# Patient Record
Sex: Female | Born: 1940
Health system: Southern US, Community
[De-identification: ages and names within clinical notes are randomized; demographics above are authoritative.]

## PROBLEM LIST (undated history)

## (undated) DIAGNOSIS — R112 Nausea with vomiting, unspecified: Secondary | ICD-10-CM

## (undated) DIAGNOSIS — N189 Chronic kidney disease, unspecified: Secondary | ICD-10-CM

## (undated) DIAGNOSIS — M48061 Spinal stenosis, lumbar region without neurogenic claudication: Secondary | ICD-10-CM

## (undated) DIAGNOSIS — G2581 Restless legs syndrome: Secondary | ICD-10-CM

## (undated) DIAGNOSIS — I10 Essential (primary) hypertension: Secondary | ICD-10-CM

## (undated) DIAGNOSIS — F419 Anxiety disorder, unspecified: Secondary | ICD-10-CM

## (undated) DIAGNOSIS — M797 Fibromyalgia: Secondary | ICD-10-CM

## (undated) DIAGNOSIS — M1990 Unspecified osteoarthritis, unspecified site: Secondary | ICD-10-CM

## (undated) DIAGNOSIS — R011 Cardiac murmur, unspecified: Secondary | ICD-10-CM

## (undated) DIAGNOSIS — I509 Heart failure, unspecified: Secondary | ICD-10-CM

## (undated) DIAGNOSIS — K219 Gastro-esophageal reflux disease without esophagitis: Secondary | ICD-10-CM

## (undated) DIAGNOSIS — G971 Other reaction to spinal and lumbar puncture: Secondary | ICD-10-CM

## (undated) DIAGNOSIS — J189 Pneumonia, unspecified organism: Secondary | ICD-10-CM

## (undated) DIAGNOSIS — C801 Malignant (primary) neoplasm, unspecified: Secondary | ICD-10-CM

## (undated) DIAGNOSIS — Z9889 Other specified postprocedural states: Secondary | ICD-10-CM

## (undated) DIAGNOSIS — H269 Unspecified cataract: Secondary | ICD-10-CM

## (undated) DIAGNOSIS — Z87898 Personal history of other specified conditions: Secondary | ICD-10-CM

## (undated) DIAGNOSIS — E785 Hyperlipidemia, unspecified: Secondary | ICD-10-CM

## (undated) DIAGNOSIS — Z950 Presence of cardiac pacemaker: Secondary | ICD-10-CM

## (undated) DIAGNOSIS — E119 Type 2 diabetes mellitus without complications: Secondary | ICD-10-CM

## (undated) DIAGNOSIS — E041 Nontoxic single thyroid nodule: Secondary | ICD-10-CM

## (undated) DIAGNOSIS — G894 Chronic pain syndrome: Secondary | ICD-10-CM

## (undated) DIAGNOSIS — E1140 Type 2 diabetes mellitus with diabetic neuropathy, unspecified: Secondary | ICD-10-CM

## (undated) HISTORY — PX: TUBAL LIGATION: SHX77

## (undated) HISTORY — PX: COLON SURGERY: SHX602

## (undated) HISTORY — DX: Restless legs syndrome: G25.81

## (undated) HISTORY — PX: TONSILLECTOMY: SUR1361

## (undated) HISTORY — PX: APPENDECTOMY: SHX54

## (undated) HISTORY — PX: PARTIAL NEPHRECTOMY: SHX414

## (undated) HISTORY — DX: Hyperlipidemia, unspecified: E78.5

## (undated) HISTORY — DX: Essential (primary) hypertension: I10

## (undated) HISTORY — PX: BREAST SURGERY: SHX581

## (undated) HISTORY — PX: DILATION AND CURETTAGE OF UTERUS: SHX78

## (undated) HISTORY — DX: Chronic pain syndrome: G89.4

## (undated) HISTORY — DX: Type 2 diabetes mellitus without complications: E11.9

---

## 2015-05-10 ENCOUNTER — Encounter: Payer: Self-pay | Admitting: *Deleted

## 2015-05-10 ENCOUNTER — Ambulatory Visit (INDEPENDENT_AMBULATORY_CARE_PROVIDER_SITE_OTHER): Payer: Medicare Other | Admitting: Cardiovascular Disease

## 2015-05-10 ENCOUNTER — Other Ambulatory Visit: Payer: Self-pay | Admitting: *Deleted

## 2015-05-10 VITALS — BP 169/80 | HR 51 | Ht 64.0 in | Wt 204.0 lb

## 2015-05-10 DIAGNOSIS — R001 Bradycardia, unspecified: Secondary | ICD-10-CM

## 2015-05-10 DIAGNOSIS — R0609 Other forms of dyspnea: Secondary | ICD-10-CM | POA: Diagnosis not present

## 2015-05-10 DIAGNOSIS — R5383 Other fatigue: Secondary | ICD-10-CM | POA: Diagnosis not present

## 2015-05-10 DIAGNOSIS — G4733 Obstructive sleep apnea (adult) (pediatric): Secondary | ICD-10-CM

## 2015-05-10 DIAGNOSIS — I10 Essential (primary) hypertension: Secondary | ICD-10-CM

## 2015-05-10 DIAGNOSIS — I495 Sick sinus syndrome: Secondary | ICD-10-CM | POA: Diagnosis not present

## 2015-05-10 DIAGNOSIS — I38 Endocarditis, valve unspecified: Secondary | ICD-10-CM

## 2015-05-10 DIAGNOSIS — I83893 Varicose veins of bilateral lower extremities with other complications: Secondary | ICD-10-CM

## 2015-05-10 DIAGNOSIS — R0600 Dyspnea, unspecified: Secondary | ICD-10-CM

## 2015-05-10 MED ORDER — VALSARTAN 160 MG PO TABS: 160.0000 mg | ORAL_TABLET | Freq: Every day | ORAL | Status: DC

## 2015-05-10 NOTE — Progress Notes (Signed)
Patient ID: [name], female   DOB: 1940/11/28, 74 y.o.   MRN: [id number]       CARDIOLOGY CONSULT NOTE  Patient ID: [name] MRN: [id number] DOB/AGE: 1941-02-16 74 y.o.  Admit date: (Not on file) Primary Physician Clinton Quant, MD  Reason for Consultation: bradycardia  HPI: The patient is a 74 yr old woman with hypertension, chronic pain, diabetes, obesity, and dyslipidemia referred for bradycardia. She has also been experiencing bilateral leg edema.   She was apparently evaluated by Dr. Orpah Greek (cardiologist in [place]) on 03/29/2015 and he ordered an echocardiogram as well as a vein study and prescribed compression stockings. On the day she was to be evaluated with an echocardiogram, she was apparently not feeling well and her heart rate was in the 30 beat per minute range. She was given a Holter monitor and she had a 2.8 second pause as well as a few PACs and multiple episodes of bradycardia with pauses. Average heart rate was 48 bpm and the heart rate reportedly range from 27-85 bpm.   She has had some issues with balance but denies recent syncope. There have been episodes in the past where she lost consciousness. She does not remember the details surrounding those events. She is not taking any AV nodal blocking agents.  Echocardiogram reportedly showed normal left ventricular systolic function, LVEF 123456, moderate LVH, left atrial dilatation, moderate mitral and tricuspid regurgitation, mild aortic regurgitation, and mild aortic stenosis.   He reportedly discussed having a pacemaker placed. She decided not to proceed with pacemaker placement on 8/16 and comes today for another opinion with her daughter, [name].  She denies chest pain but has had shortness of breath with exertion. Her leg swelling improved with Lasix prn and cessation of amlodipine.  She and her daughter believe she has sleep apnea but she is afraid to get tested due to claustrophobia.  Review  of labs performed on 12/16/14 showed BUN 24, creatinine 1.3, hemoglobin 14.6, total cholesterol 122, triglycerides 94, HDL 54, LDL 49, hemoglobin A1c 7.6%, vitamin D 21.   ECG performed on 03/29/15 show sinus rhythm, heart rate 63 bpm, with a nonspecific T wave abnormality.   She has been checking her blood pressures at home with systolic readings consistently in the 160 and up to 180 range, with heart rates in the 40 beat per minute range.  Believes thyroid function is normal.  Initial ECG in the office today appeared to show sinus vs ectopic bradycardia, heart rate 48 bpm with a nonspecific T wave abnormality. Repeat ECG shows the same.  Soc: Daughter, [name], lives in [place] and attends [employer] with [employer]. Pt lives in [place], [place]. PCP is Dr. Harmon Pier in [place].  Allergies not on file  No current outpatient prescriptions on file.   No current facility-administered medications for this visit.    Past Medical History  Diagnosis Date  . Dyslipidemia   . Diabetes   . Hypertension   . Chronic pain syndrome     No past surgical history on file.  Social History   Social History  . Marital Status: Widowed    Spouse Name: N/A  . Number of Children: N/A  . Years of Education: N/A   Occupational History  . Not on file.   Social History Main Topics  . Smoking status: Former Smoker    Start date: 09/10/1953    Quit date: 09/11/1983  . Smokeless tobacco: Not on file  . Alcohol Use: Not on file  .  Drug Use: Not on file  . Sexual Activity: Not on file   Other Topics Concern  . Not on file   Social History Narrative  . No narrative on file     No family history of premature CAD in 1st degree relatives.  Prior to Admission medications   Not on File     Review of systems complete and found to be negative unless listed above in HPI     Physical exam Height 5\' 4"  (1.626 m), weight 204 lb (92.534 kg). General: NAD Neck: No JVD, no thyromegaly or thyroid  nodule.  Lungs: Clear to auscultation bilaterally with normal respiratory effort. CV: Nondisplaced PMI. Bradycardic, regular rhythm, normal S1/S2, no [id number], soft systolic murmur along left sternal border.  1+ pitting pretibial and periankle edema.  No carotid bruit.  Normal pedal pulses.  Abdomen: Soft, nontender, obese, no distention.  Skin: Intact without lesions or rashes.  Neurologic: Alert and oriented x 3.  Psych: Normal affect. Extremities: No clubbing or cyanosis.  HEENT: Normal.   ECG: Most recent ECG reviewed.  Labs:  No results found for: WBC, HGB, HCT, MCV, PLT No results for input(s): NA, K, CL, CO2, BUN, CREATININE, CALCIUM, PROT, BILITOT, ALKPHOS, ALT, AST, GLUCOSE in the last 168 hours.  Invalid input(s): LABALBU No results found for: CKTOTAL, CKMB, CKMBINDEX, TROPONINI No results found for: CHOL No results found for: HDL No results found for: LDLCALC No results found for: TRIG No results found for: CHOLHDL No results found for: LDLDIRECT       Studies: No results found.  ASSESSMENT AND PLAN:  1. Bradycardia and pauses with exertional dyspnea and dizziness with fatigue: She has sick sinus syndrome. I will have her wear a two-week event monitor to evaluate for additional pauses while awake as well as for symptomatic bradycardia. If she indeed has symptomatic bradycardia along with pauses while awake, pacemaker implantation would indeed be indicated. She likely has sleep apnea which is also contributing but she is afraid to pursue a sleep study. I will obtain most recent blood tests from PCP including TSH.  2. Essential HTN: Markedly elevated, and likely being exacerbated by uncontrolled sleep apnea. Amlodipine has been on hold as it may have been contributing to leg swelling. Will stop benazepril (unlikely to achieve further BP reduction with increased doses) and start Diovan 160 mg daily. Will check a basic metabolic panel in 2 weeks.  3. Sleep apnea: Afraid to  pursue sleep study due to claustrophobia. Likely contributing to uncontrolled hypertension as well as bradyarrhythmias.  4. Valvular heart disease: Monitor clinically for heart failure. Stable at present.  5. Leg edema: Using Lasix. Prescribed compression stockings in past. Likely multifactorial and due to venous insufficiency, uncontrolled hypertension, and sick sinus syndrome.  Dispo: f/u 1 month.   Signed: Kate Sable, M.D., F.A.C.C.  05/10/2015, 9:41 AM

## 2015-05-10 NOTE — Patient Instructions (Addendum)
Your physician has recommended you make the following change in your medication:  Stop benazepril. Start diovan 160 mg daily. Continue all other medications the same. Your physician recommends that you return for lab work today to check your BMET. Your physician has recommended that you wear an event monitor for 14 days. Event monitors are medical devices that record the heart's electrical activity. Doctors most often [place] these monitors to diagnose arrhythmias. Arrhythmias are problems with the speed or rhythm of the heartbeat. The monitor is a small, portable device. You can wear one while you do your normal daily activities. This is usually used to diagnose what is causing palpitations/syncope (passing out). Your physician has requested that you regularly monitor and record your blood pressure readings at home. Please use the same machine at the same time of day to check your readings and record them to bring to your follow-up visit.[employer] will contact you directly about this monitor. Your physician recommends that you schedule a follow-up appointment in: 4-6 weeks.

## 2015-05-12 ENCOUNTER — Telehealth: Payer: Self-pay | Admitting: *Deleted

## 2015-05-12 NOTE — Telephone Encounter (Signed)
Notes Recorded by Laurine Blazer, LPN on QA348G at 624THL AM Copy fwd to PMD.  Notes Recorded by Laurine Blazer, LPN on QA348G at 624THL AM Patient notified and verbalized understanding.

## 2015-05-12 NOTE — Telephone Encounter (Signed)
-----   Message from Herminio Commons, MD sent at 05/10/2015  4:34 PM EDT ----- Kidney function improved when compared to labs from April.

## 2015-05-17 ENCOUNTER — Telehealth: Payer: Self-pay | Admitting: Cardiovascular Disease

## 2015-05-17 NOTE — Telephone Encounter (Signed)
Mrs. Bullman called stating that she wants to know when she will be hearing about her heart monitor.

## 2015-05-18 NOTE — Telephone Encounter (Signed)
Orders were placed on 8/30 but did not get enrolled with [employer] as pt says she has never received monitor. Enrolled pt for 14 day event monitor and LM for pt.

## 2015-05-23 ENCOUNTER — Ambulatory Visit (INDEPENDENT_AMBULATORY_CARE_PROVIDER_SITE_OTHER): Payer: Medicare Other

## 2015-05-23 DIAGNOSIS — R001 Bradycardia, unspecified: Secondary | ICD-10-CM

## 2015-05-27 ENCOUNTER — Telehealth: Payer: Self-pay | Admitting: *Deleted

## 2015-05-27 NOTE — Telephone Encounter (Signed)
Telemetry strips reviewed. Show sinus bradycardia to 30s, as well as intermittent junctional escape beats. Patient reportedly asymptomatic. Episodes occurred in early AM at 316 and 440AM, however also epsidoe around 822 pm. She is on no av nodal agents. Continue monitor and f/u with Dr Bronson Ing.   Zandra Abts MD

## 2015-05-27 NOTE — Telephone Encounter (Signed)
Can we print out the strips of the event  Zandra Abts MD

## 2015-05-27 NOTE — Telephone Encounter (Signed)
[name] from [facility] called per protocol to report sinus bradycardia of 28 BPM this AM for pt wearing event monitor. Pt was contacted and was asymptomatic sweeping the kitchen floor at the time. HR is 60 BPM at present. Will forward to Dr. Harl Bowie with Dr. Bronson Ing out of office this week.

## 2015-05-30 ENCOUNTER — Telehealth: Payer: Self-pay | Admitting: Cardiovascular Disease

## 2015-05-30 NOTE — Telephone Encounter (Signed)
Stated that she wore monitor x 10 days.  Was supposed to wear for 14 days, but could not tolerate any longer.  She will go ahead & mail back to [employer].

## 2015-05-30 NOTE — Telephone Encounter (Signed)
Mrs. Mandella called stating that she took her monitor off yesterday. States that it is causing her to have a rash.

## 2015-06-03 ENCOUNTER — Telehealth: Payer: Self-pay | Admitting: Cardiovascular Disease

## 2015-06-03 NOTE — Telephone Encounter (Signed)
Wants to know what monitor showed.  She needs to have a tooth pullled and would like to know what she needs to do about having dentist contact us or does she have to see Dr Raliegh Ip first

## 2015-06-03 NOTE — Telephone Encounter (Signed)
Patient advised that once her monitor has been resulted on, we would contact her with results. Also advised patient to have her dentist office contact us directly about any questions or concerns. Patient verbalized understanding.

## 2015-06-06 NOTE — Telephone Encounter (Signed)
patient called asking for results of her monitor. Said that she has some errands to run this morning but would be home later this afternoon. Would like a call back today

## 2015-06-08 ENCOUNTER — Telehealth: Payer: Self-pay | Admitting: Cardiovascular Disease

## 2015-06-08 DIAGNOSIS — I441 Atrioventricular block, second degree: Secondary | ICD-10-CM

## 2015-06-08 NOTE — Telephone Encounter (Signed)
I called and spoke with [name] and informed her of the event monitoring results, and the need for pacemaker placement. Will arrange for her to see Dr. Jolyn Nap.

## 2015-06-08 NOTE — Telephone Encounter (Signed)
Referral done & [facility]) made aware.

## 2015-06-08 NOTE — Addendum Note (Signed)
Addended by: Laurine Blazer on: 06/08/2015 05:00 PM   Modules accepted: Orders

## 2015-06-10 ENCOUNTER — Other Ambulatory Visit: Payer: Self-pay

## 2015-06-13 ENCOUNTER — Encounter: Payer: Self-pay | Admitting: Internal Medicine

## 2015-06-13 ENCOUNTER — Ambulatory Visit (INDEPENDENT_AMBULATORY_CARE_PROVIDER_SITE_OTHER): Payer: Medicare Other | Admitting: Internal Medicine

## 2015-06-13 VITALS — BP 154/88 | HR 49 | Ht 64.0 in | Wt 201.8 lb

## 2015-06-13 DIAGNOSIS — R0609 Other forms of dyspnea: Secondary | ICD-10-CM

## 2015-06-13 DIAGNOSIS — R0600 Dyspnea, unspecified: Secondary | ICD-10-CM

## 2015-06-13 DIAGNOSIS — I441 Atrioventricular block, second degree: Secondary | ICD-10-CM

## 2015-06-13 LAB — BASIC METABOLIC PANEL
BUN: 13 mg/dL (ref 6–23)
CO2: 27 mEq/L (ref 19–32)
Calcium: 9.5 mg/dL (ref 8.4–10.5)
Chloride: 103 mEq/L (ref 96–112)
Creatinine, Ser: 1.24 mg/dL — ABNORMAL HIGH (ref 0.40–1.20)
GFR: 44.67 mL/min — ABNORMAL LOW (ref >60.00)
Glucose, Bld: 115 mg/dL — ABNORMAL HIGH (ref 70–99)
Potassium: 5.1 mEq/L (ref 3.5–5.1)
Sodium: 139 mEq/L (ref 135–145)

## 2015-06-13 LAB — TSH: TSH: 3.44 u[IU]/mL (ref 0.35–4.50)

## 2015-06-13 NOTE — Patient Instructions (Signed)
Medication Instructions:  Your physician recommends that you continue on your current medications as directed. Please refer to the Current Medication list given to you today.  Labwork: Today: BMET & TSH  Testing/Procedures: Your physician has requested that you have a lexiscan myoview. For further information please visit [email or web address]. Please follow instruction sheet, as given.  Follow-Up: Follow up to be determined after Myoview Carlton Adam) testing.  Any Other Special Instructions Will Be Listed Below (If Applicable). Thank you for choosing [facility]!!

## 2015-06-13 NOTE — Progress Notes (Signed)
ELECTROPHYSIOLOGY CONSULT NOTE  Patient ID: [name], MRN: [id number], DOB/AGE: 1941-02-13 73 y.o. Admit date: (Not on file) Date of Consult: 06/13/2015  Primary Physician: Clinton Quant, MD Primary Cardiologist: SKo  Chief Complaint: bradycardia   HPI [name] is a 74 y.o. female  Seen following recommendation. Goal for pacemaker implantation because of bradycardia.  She underwent evaluation for dyspnea on exertion and was noted to have bradycardia. This is been present I guess for some time. She was referred to cardiology.   Her evaluation has included a Holter monitor which had a nocturnal sinus pause of 2.8 seconds. An outpatient event recorder was described as having 2:1 AV block; I think that this is erroneous. I I think that she has sinus bradycardia with prominent U waves.. It is felt that she has sleep apnea but has been reluctant to be tested because of claustrophobia  She has significant dyspnea on exertion but denies exertional chest discomfort. She sleeps in a recliner and is unable to lie flat. She has had ongoing problems with peripheral edema as outlined below.     Echocardiogram demonstrated mild aortic stenosis and normal LV function  Her past medical history is noted for hypertension and diabetes obesity and hyperlipidemia she's also had problems with  peripheral edema the latter treated with when necessary Lasix and the discontinuation of amlodipine.  Apparently venous Dopplers prompted the prescription of compression stockings suggesting venous insufficiency.  She has a history of recurrent orthostatic syncope. She has fractured her feet on multiple occasions. She's had no syncope sitting down.  She has poor balance.   Creatinine 4/16 was 1.3  She was started on ARB for blood pressure. Renal function has not been reassessed. No TSH is available in the medical record.  Past Medical History  Diagnosis Date  . Dyslipidemia   . Diabetes ([place])     . Hypertension   . Chronic pain syndrome   . Restless legs       Surgical History:  Past Surgical History  Procedure Laterality Date  . Partial nephrectomy       Home Meds: Prior to Admission medications   Medication Sig Start Date End Date Taking? Authorizing Provider  amLODipine (NORVASC) 5 MG tablet Take 1 tablet by mouth daily 03/21/15   Historical Provider, MD  benazepril (LOTENSIN) 20 MG tablet Take 1 tablet by mouth daily 04/11/15   Historical Provider, MD  Cholecalciferol (VITAMIN D) 2000 UNITS tablet Take 2,000 Units by mouth daily.    Historical Provider, MD  DULoxetine (CYMBALTA) 20 MG capsule Take 20 mg by mouth daily.    Historical Provider, MD  furosemide (LASIX) 20 MG tablet Take 20-40 mg by mouth daily as needed (swelling).     Historical Provider, MD  gabapentin (NEURONTIN) 800 MG tablet Take 800 mg by mouth 3 (three) times daily.    Historical Provider, MD  LORazepam (ATIVAN) 1 MG tablet Take 1 mg by mouth every 8 (eight) hours. TAKE 1/2 TO 1 TABLET DAILY AS NEEDED    Historical Provider, MD  Melatonin 10 MG CAPS Take by mouth.    Historical Provider, MD  meloxicam (MOBIC) 7.5 MG tablet Take 7.5 mg by mouth daily.    Historical Provider, MD  OxyCODONE (OXYCONTIN) 10 mg T12A 12 hr tablet Take 10 mg by mouth every 12 (twelve) hours.    Historical Provider, MD  QUININE SULFATE PO Take 260 mg by mouth.    Historical Provider, MD  simvastatin (ZOCOR) 40 MG tablet  Take 40 mg by mouth daily.    Historical Provider, MD  sitaGLIPtin (JANUVIA) 100 MG tablet Take 50 mg by mouth daily.    Historical Provider, MD  tiZANidine (ZANAFLEX) 2 MG tablet TK 1 T PO  BID TO TID 03/10/15   Historical Provider, MD  valsartan (DIOVAN) 160 MG tablet Take 1 tablet (160 mg total) by mouth daily. 05/10/15   Herminio Commons, MD  zolpidem (AMBIEN) 10 MG tablet Take 10 mg by mouth at bedtime as needed for sleep.    Historical Provider, MD     Allergies:  Allergies  Allergen Reactions  . Aspirin      Stomach burning  . Ivp Dye [Iodinated Diagnostic Agents]     Can use if Benadryl and Prednisone are used first    . Penicillins     Severe diarrhea   . Sulfa Antibiotics Diarrhea  . Iodine Itching, Swelling and Rash    Can use if Benadryl and Prednisone are used first     Social History   Social History  . Marital Status: Widowed    Spouse Name: N/A  . Number of Children: N/A  . Years of Education: N/A   Occupational History  . Not on file.   Social History Main Topics  . Smoking status: Former Smoker    Start date: 09/10/1953    Quit date: 09/11/1983  . Smokeless tobacco: Not on file  . Alcohol Use: Not on file  . Drug Use: Not on file  . Sexual Activity: Not on file   Other Topics Concern  . Not on file   Social History Narrative     Family History  Problem Relation Age of Onset  . CAD    . Sick sinus syndrome Brother     has a PPM     ROS:  Please see the history of present illness.     All other systems reviewed and negative.    Physical Exam:  Blood pressure 154/88, pulse 49, height 5\' 4"  (1.626 m), weight 201 lb 12.8 oz (91.536 kg). General: Well developed, well nourished female in no acute distress. Head: Normocephalic, atraumatic, sclera non-icteric, no xanthomas, nares are without discharge. EENT: normal Lymph Nodes:  none Back: without scoliosis/kyphosis , no CVA tendersness Neck: Negative for carotid bruits. JVD 8-10 cm Lungs: Clear bilaterally to auscultation without wheezes, rales, or rhonchi. Breathing is unlabored. Heart: RRR with S1 S2.  2/6 systolic murmur , rubs, or gallops appreciated. Abdomen: Soft, non-tender, non-distended with normoactive bowel sounds. No hepatomegaly. No rebound/guarding. No obvious abdominal masses. Msk:  Strength and tone appear normal for age. There is significant foot deformities Extremities: No clubbing or cyanosis.  1+edema.  Distal pedal pulses are 2+ and equal bilaterally. Skin: Warm and Dry Neuro:  Alert and oriented X 3. CN III-XII intact Grossly normal sensory and motor function . Psych:  Responds to questions appropriately with a normal affect.      Labs: Cardiac Enzymes No results for input(s): CKTOTAL, CKMB, TROPONINI in the last 72 hours. CBC No results found for: WBC, HGB, HCT, MCV, PLT PROTIME: No results for input(s): LABPROT, INR in the last 72 hours. Chemistry No results for input(s): NA, K, CL, CO2, BUN, CREATININE, CALCIUM, PROT, BILITOT, ALKPHOS, ALT, AST, GLUCOSE in the last 168 hours.  Invalid input(s): LABALBU Lipids No results found for: CHOL, HDL, LDLCALC, TRIG BNP No results found for: PROBNP Thyroid Function Tests: No results for input(s): TSH, T4TOTAL, T3FREE, THYROIDAB in the last  72 hours.  Invalid input(s): FREET3    Miscellaneous No results found for: DDIMER  Radiology/Studies:  No results found.  EKG:  Junctional rhythm with a single isolated P-wave    Assessment and Plan:   Sinus node dysfunction-symptomatic  Obstructive sleep apnea  Dyspnea on exertion  Hypertension   Diabetes with neuropathy  There are multiple issues. She has sinus arrest evidenced on her tracings today with a isolated P-wave. We discussed the physiology of bradycardia as suggested by her Holter monitor with a peak heart rate of 80 as well as a contribution of atrial filling 2 cardiac performance and its importance and hypertensive/diabetic cardiomyopathy. I think she has some degree of HFpEF. Patient is appropriate. She has symptomatic sinus bradycardia without secondary causes.  Her dyspnea on exertion has a multitude of potential explanations including the above. However, given her long-standing diabetes and hypertension, I think excluding coronary disease is appropriate. We will undertake a Lexiscan Myoview.  She also has obstructive sleep apnea with sleep disordered breathing and daytime somnolence and nocturnal profound bradycardia. She has a history of  claustrophobia. She is reluctant to pursue sleep testing. I've encouraged her to do so.  She has a history of renal issues with single kidney-apparently. We will recheck her metabolic profile. Given the fact that she has some degree of volume overload, we will initiate diuretic therapy once we have an idea a to what  her kidney function is.      Virl Axe

## 2015-06-16 ENCOUNTER — Telehealth (HOSPITAL_COMMUNITY): Payer: Self-pay | Admitting: *Deleted

## 2015-06-16 NOTE — Telephone Encounter (Signed)
Patient given detailed instructions per Myocardial Perfusion Study Information Sheet for test on 10/11/16at 1000. Patient notified to arrive 15 minutes early and that it is imperative to arrive on time for appointment to keep from having the test rescheduled.  If you need to cancel or reschedule your appointment, please call the office within 24 hours of your appointment. Failure to do so may result in a cancellation of your appointment, and a $50 no show fee. Patient verbalized understanding. Hubbard Robinson, RN

## 2015-06-21 ENCOUNTER — Ambulatory Visit (HOSPITAL_COMMUNITY): Payer: Medicare Other | Attending: Cardiovascular Disease

## 2015-06-21 DIAGNOSIS — R002 Palpitations: Secondary | ICD-10-CM | POA: Diagnosis not present

## 2015-06-21 DIAGNOSIS — R5383 Other fatigue: Secondary | ICD-10-CM | POA: Insufficient documentation

## 2015-06-21 DIAGNOSIS — R0609 Other forms of dyspnea: Secondary | ICD-10-CM | POA: Diagnosis not present

## 2015-06-21 DIAGNOSIS — R0600 Dyspnea, unspecified: Secondary | ICD-10-CM

## 2015-06-21 DIAGNOSIS — E119 Type 2 diabetes mellitus without complications: Secondary | ICD-10-CM | POA: Insufficient documentation

## 2015-06-21 DIAGNOSIS — I10 Essential (primary) hypertension: Secondary | ICD-10-CM | POA: Diagnosis not present

## 2015-06-21 LAB — MYOCARDIAL PERFUSION IMAGING
LV dias vol: 101 mL
LV sys vol: 36 mL
Peak HR: 61 {beats}/min
RATE: 0.27
Rest HR: 38 {beats}/min
SDS: 3
SRS: 4
SSS: 6
TID: 1.21

## 2015-06-21 MED ORDER — REGADENOSON 0.4 MG/5ML IV SOLN
0.4000 mg | Freq: Once | INTRAVENOUS | Status: AC
  Administered 2015-06-21: 0.4 mg via INTRAVENOUS

## 2015-06-21 MED ORDER — TECHNETIUM TC 99M SESTAMIBI GENERIC - CARDIOLITE
32.8000 | Freq: Once | INTRAVENOUS | Status: AC | PRN
  Administered 2015-06-21: 32.8 via INTRAVENOUS

## 2015-06-21 MED ORDER — TECHNETIUM TC 99M SESTAMIBI GENERIC - CARDIOLITE
10.9000 | Freq: Once | INTRAVENOUS | Status: AC | PRN
  Administered 2015-06-21: 10.9 via INTRAVENOUS

## 2015-06-23 ENCOUNTER — Telehealth: Payer: Self-pay | Admitting: Internal Medicine

## 2015-06-23 DIAGNOSIS — I495 Sick sinus syndrome: Secondary | ICD-10-CM

## 2015-06-23 DIAGNOSIS — Z01812 Encounter for preprocedural laboratory examination: Secondary | ICD-10-CM

## 2015-06-23 NOTE — Telephone Encounter (Signed)
New Message   Pt called and wants results of her MYOCARDIAL PERFUSION   Please call patient

## 2015-06-23 NOTE — Telephone Encounter (Signed)
I spoke with the patient and made her aware of her myoview results. She is wanting to know the follow up plan for her going forward as there was mention of a PPM at one point. I advised her I will review with Dr. Caryl Comes and call her back. She is agreeable.

## 2015-06-24 ENCOUNTER — Ambulatory Visit: Payer: Medicare Other | Admitting: Cardiovascular Disease

## 2015-06-24 NOTE — Telephone Encounter (Signed)
The patient is scheduled for her PPM implant on 11/10 at her request. I advised her I will mail her her letter of instructions and will contact her PCP office next week to see if her pre-procedure labs can be done there since she lives in [place]. I will call her back to confirm this. She is agreeable.

## 2015-06-24 NOTE — Telephone Encounter (Signed)
Reviewed follow up plan for the patient since myoview is normal. Per Dr. Caryl Comes- ok to set up for PPM implant. The patient is aware of this and dates given.  She will call back to schedule once she reviews with her daughter.

## 2015-07-01 NOTE — Telephone Encounter (Signed)
Attempted to call Dr. Danelle Berry office regarding patient's pre-procedure labs being drawn there for PPM implant on 07/21/15. Per message at the office [phone], the office is closed on [place]. The phone are open M-F 7:30 am- 4:30 pm. Will call back next week.

## 2015-07-04 ENCOUNTER — Encounter: Payer: Self-pay | Admitting: *Deleted

## 2015-07-04 NOTE — Telephone Encounter (Signed)
I spoke with the patient. She is aware that she may go to her PCP office for labs. Lab order/ PPM implant instruction sheet mailed to the patient. I also advised her that Dr. Caryl Comes was in agreement with her stopping amlodipine as previously recommended by Dr. Bronson Ing.

## 2015-07-04 NOTE — Telephone Encounter (Signed)
Spoke with Dr. Danelle Berry office regarding patient going there for pre-procedure labs. Per staff, ok for patient to come with lab order in hand. No appointment needed. I left a message for the patient to call.

## 2015-07-06 ENCOUNTER — Telehealth: Payer: Self-pay | Admitting: Internal Medicine

## 2015-07-06 NOTE — Telephone Encounter (Signed)
New message      Returning Heather's call

## 2015-07-06 NOTE — Telephone Encounter (Signed)
I spoke with the patient. Advised I hadn't tried to call her. She did report some swelling after eating [place] food and an elevated BP yesterday. I advised her if she is still having swelling, to take a lasix 20 mg tablet x 2 days, and to monitor her BP no more than twice daily and at least 30 minutes- 1 hour after her meds are taken.

## 2015-07-14 ENCOUNTER — Telehealth: Payer: Self-pay | Admitting: Internal Medicine

## 2015-07-14 ENCOUNTER — Ambulatory Visit: Payer: Medicare Other | Admitting: Internal Medicine

## 2015-07-14 NOTE — Telephone Encounter (Signed)
New problem    Pt need call back to discuss some questions concerning her pace maker placement.

## 2015-07-14 NOTE — Telephone Encounter (Signed)
I spoke with the patient and answered her questions regarding her PPM implant. She also reports today that her BP has been somewhat elevated.  She went for her pre-procedure labs at her PCP this morning and her BP was checked there- she was 205/88. She was given samples of Azor 5/40 mg to take once daily in place of her Diovan. She will start samples and call Dr. Danelle Berry office back on Monday to follow up.

## 2015-07-21 ENCOUNTER — Encounter (HOSPITAL_COMMUNITY): Payer: Self-pay | Admitting: *Deleted

## 2015-07-21 ENCOUNTER — Ambulatory Visit (HOSPITAL_COMMUNITY)
Admission: RE | Admit: 2015-07-21 | Discharge: 2015-07-22 | Disposition: A | Discharge status: Home / Self Care | Payer: Medicare Other | Source: Ambulatory Visit | Attending: Internal Medicine | Admitting: Internal Medicine

## 2015-07-21 ENCOUNTER — Encounter (HOSPITAL_COMMUNITY)
Admission: RE | Disposition: A | Discharge status: Home / Self Care | Payer: Medicare Other | Source: Ambulatory Visit | Attending: Internal Medicine

## 2015-07-21 DIAGNOSIS — R0600 Dyspnea, unspecified: Secondary | ICD-10-CM | POA: Insufficient documentation

## 2015-07-21 DIAGNOSIS — Z959 Presence of cardiac and vascular implant and graft, unspecified: Secondary | ICD-10-CM | POA: Insufficient documentation

## 2015-07-21 DIAGNOSIS — E785 Hyperlipidemia, unspecified: Secondary | ICD-10-CM | POA: Diagnosis not present

## 2015-07-21 DIAGNOSIS — I10 Essential (primary) hypertension: Secondary | ICD-10-CM | POA: Diagnosis not present

## 2015-07-21 DIAGNOSIS — E1140 Type 2 diabetes mellitus with diabetic neuropathy, unspecified: Secondary | ICD-10-CM | POA: Insufficient documentation

## 2015-07-21 DIAGNOSIS — Z91041 Radiographic dye allergy status: Secondary | ICD-10-CM | POA: Insufficient documentation

## 2015-07-21 DIAGNOSIS — Z880 Allergy status to penicillin: Secondary | ICD-10-CM | POA: Diagnosis not present

## 2015-07-21 DIAGNOSIS — G894 Chronic pain syndrome: Secondary | ICD-10-CM | POA: Diagnosis not present

## 2015-07-21 DIAGNOSIS — I495 Sick sinus syndrome: Secondary | ICD-10-CM | POA: Diagnosis not present

## 2015-07-21 DIAGNOSIS — G4733 Obstructive sleep apnea (adult) (pediatric): Secondary | ICD-10-CM | POA: Diagnosis not present

## 2015-07-21 HISTORY — PX: EP IMPLANTABLE DEVICE: SHX172B

## 2015-07-21 LAB — BASIC METABOLIC PANEL
Anion gap: 7 (ref 5–15)
BUN: 20 mg/dL (ref 6–20)
CO2: 31 mmol/L (ref 22–32)
Calcium: 9.5 mg/dL (ref 8.9–10.3)
Chloride: 101 mmol/L (ref 101–111)
Creatinine, Ser: 1.27 mg/dL — ABNORMAL HIGH (ref 0.44–1.00)
GFR calc Af Amer: 46 mL/min — ABNORMAL LOW (ref >60)
GFR calc non Af Amer: 40 mL/min — ABNORMAL LOW (ref >60)
Glucose, Bld: 118 mg/dL — ABNORMAL HIGH (ref 65–99)
Potassium: 4.8 mmol/L (ref 3.5–5.1)
Sodium: 139 mmol/L (ref 135–145)

## 2015-07-21 LAB — CBC
HCT: 44.7 % (ref 36.0–46.0)
Hemoglobin: 14.9 g/dL (ref 12.0–15.0)
MCH: 31.3 pg (ref 26.0–34.0)
MCHC: 33.3 g/dL (ref 30.0–36.0)
MCV: 93.9 fL (ref 78.0–100.0)
Platelets: 122 10*3/uL — ABNORMAL LOW (ref 150–400)
RBC: 4.76 MIL/uL (ref 3.87–5.11)
RDW: 13.6 % (ref 11.5–15.5)
WBC: 8.4 10*3/uL (ref 4.0–10.5)

## 2015-07-21 LAB — GLUCOSE, CAPILLARY
Glucose-Capillary: 113 mg/dL — ABNORMAL HIGH (ref 65–99)
Glucose-Capillary: 356 mg/dL — ABNORMAL HIGH (ref 65–99)

## 2015-07-21 LAB — SURGICAL PCR SCREEN
MRSA, PCR: NEGATIVE
Staphylococcus aureus: NEGATIVE

## 2015-07-21 LAB — PROTIME-INR
INR: 1.02 (ref 0.00–1.49)
Prothrombin Time: 13.6 seconds (ref 11.6–15.2)

## 2015-07-21 SURGERY — PACEMAKER IMPLANT
Anesthesia: LOCAL

## 2015-07-21 MED ORDER — AMLODIPINE BESYLATE 5 MG PO TABS
5.0000 mg | ORAL_TABLET | Freq: Every day | ORAL | Status: DC
Start: 2015-07-22 — End: 2015-07-22
  Administered 2015-07-22: 5 mg via ORAL
  Filled 2015-07-21: qty 1

## 2015-07-21 MED ORDER — ZOLPIDEM TARTRATE 10 MG PO TABS: 5.0000 mg | ORAL_TABLET | Freq: Every day | ORAL | Status: DC

## 2015-07-21 MED ORDER — VITAMIN D 1000 UNITS PO TABS
2000.0000 [IU] | ORAL_TABLET | Freq: Every day | ORAL | Status: DC
  Administered 2015-07-22: 2000 [IU] via ORAL
  Filled 2015-07-21: qty 2

## 2015-07-21 MED ORDER — AMLODIPINE BESYLATE 2.5 MG PO TABS
2.5000 mg | ORAL_TABLET | Freq: Every day | ORAL | Status: DC | PRN
  Filled 2015-07-21: qty 1

## 2015-07-21 MED ORDER — ZOLPIDEM TARTRATE 5 MG PO TABS
5.0000 mg | ORAL_TABLET | Freq: Every evening | ORAL | Status: DC | PRN
  Administered 2015-07-21: 21:00:00 5 mg via ORAL
  Filled 2015-07-21: qty 1

## 2015-07-21 MED ORDER — IRBESARTAN 300 MG PO TABS
300.0000 mg | ORAL_TABLET | Freq: Every day | ORAL | Status: DC
  Administered 2015-07-22: 300 mg via ORAL
  Filled 2015-07-21: qty 1

## 2015-07-21 MED ORDER — HEPARIN (PORCINE) IN NACL 2-0.9 UNIT/ML-% IJ SOLN
INTRAMUSCULAR | Status: AC
  Filled 2015-07-21: qty 500

## 2015-07-21 MED ORDER — MUPIROCIN 2 % EX OINT
TOPICAL_OINTMENT | CUTANEOUS | Status: AC
  Administered 2015-07-21: 1
  Filled 2015-07-21: qty 22

## 2015-07-21 MED ORDER — MIDAZOLAM HCL 5 MG/5ML IJ SOLN
INTRAMUSCULAR | Status: DC | PRN
  Administered 2015-07-21: 2 mg via INTRAVENOUS
  Administered 2015-07-21 (×2): 1 mg via INTRAVENOUS

## 2015-07-21 MED ORDER — QUININE SULFATE 324 MG PO CAPS: 20.0000 mg | ORAL_CAPSULE | ORAL | Status: DC | PRN

## 2015-07-21 MED ORDER — DULOXETINE HCL 20 MG PO CPEP
20.0000 mg | ORAL_CAPSULE | Freq: Every day | ORAL | Status: DC
  Administered 2015-07-22: 09:00:00 20 mg via ORAL
  Filled 2015-07-21: qty 1

## 2015-07-21 MED ORDER — VANCOMYCIN HCL IN DEXTROSE 1-5 GM/200ML-% IV SOLN: 1000.0000 mg | INTRAVENOUS | Status: DC

## 2015-07-21 MED ORDER — OXYCODONE HCL ER 10 MG PO T12A
10.0000 mg | EXTENDED_RELEASE_TABLET | Freq: Two times a day (BID) | ORAL | Status: DC
  Administered 2015-07-21 – 2015-07-22 (×2): 10 mg via ORAL
  Filled 2015-07-21 (×2): qty 1

## 2015-07-21 MED ORDER — METHYLPREDNISOLONE SODIUM SUCC 125 MG IJ SOLR
INTRAMUSCULAR | Status: AC
  Filled 2015-07-21: qty 2

## 2015-07-21 MED ORDER — ACETAMINOPHEN 325 MG PO TABS
325.0000 mg | ORAL_TABLET | ORAL | Status: DC | PRN
  Administered 2015-07-21 – 2015-07-22 (×2): 650 mg via ORAL
  Filled 2015-07-21 (×2): qty 2

## 2015-07-21 MED ORDER — AMLODIPINE-OLMESARTAN 5-40 MG PO TABS: 1.0000 | ORAL_TABLET | Freq: Every day | ORAL | Status: DC

## 2015-07-21 MED ORDER — MIDAZOLAM HCL 5 MG/5ML IJ SOLN
INTRAMUSCULAR | Status: AC
  Filled 2015-07-21: qty 25

## 2015-07-21 MED ORDER — LIDOCAINE HCL (PF) 1 % IJ SOLN
INTRAMUSCULAR | Status: DC | PRN
  Administered 2015-07-21: 17:00:00

## 2015-07-21 MED ORDER — INSULIN ASPART 100 UNIT/ML ~~LOC~~ SOLN
4.0000 [IU] | Freq: Once | SUBCUTANEOUS | Status: AC
  Administered 2015-07-21: 23:00:00 4 [IU] via SUBCUTANEOUS

## 2015-07-21 MED ORDER — SODIUM CHLORIDE 0.9 % IV SOLN: INTRAVENOUS | Status: DC

## 2015-07-21 MED ORDER — ACETAMINOPHEN 500 MG PO TABS: 1000.0000 mg | ORAL_TABLET | Freq: Four times a day (QID) | ORAL | Status: DC | PRN

## 2015-07-21 MED ORDER — DIPHENHYDRAMINE HCL 50 MG/ML IJ SOLN
25.0000 mg | INTRAMUSCULAR | Status: AC
  Administered 2015-07-21: 25 mg via INTRAVENOUS

## 2015-07-21 MED ORDER — FUROSEMIDE 20 MG PO TABS
20.0000 mg | ORAL_TABLET | Freq: Every day | ORAL | Status: DC | PRN
Start: 2015-07-21 — End: 2015-07-22

## 2015-07-21 MED ORDER — FENTANYL CITRATE (PF) 100 MCG/2ML IJ SOLN
INTRAMUSCULAR | Status: DC | PRN
  Administered 2015-07-21 (×3): 25 ug via INTRAVENOUS

## 2015-07-21 MED ORDER — ONDANSETRON HCL 4 MG/2ML IJ SOLN: 4.0000 mg | Freq: Four times a day (QID) | INTRAMUSCULAR | Status: DC | PRN

## 2015-07-21 MED ORDER — DIPHENHYDRAMINE HCL 50 MG/ML IJ SOLN
INTRAMUSCULAR | Status: AC
  Filled 2015-07-21: qty 1

## 2015-07-21 MED ORDER — YOU HAVE A PACEMAKER BOOK
Freq: Once | Status: AC
  Administered 2015-07-21: 19:00:00
  Filled 2015-07-21: qty 1

## 2015-07-21 MED ORDER — METHYLPREDNISOLONE SODIUM SUCC 125 MG IJ SOLR
125.0000 mg | INTRAMUSCULAR | Status: AC
  Administered 2015-07-21: 125 mg via INTRAVENOUS

## 2015-07-21 MED ORDER — CEFAZOLIN SODIUM-DEXTROSE 2-3 GM-% IV SOLR
INTRAVENOUS | Status: DC | PRN
  Administered 2015-07-21: 2 g via INTRAVENOUS

## 2015-07-21 MED ORDER — FAMOTIDINE IN NACL 20-0.9 MG/50ML-% IV SOLN
INTRAVENOUS | Status: AC
  Filled 2015-07-21: qty 50

## 2015-07-21 MED ORDER — LORAZEPAM 0.5 MG PO TABS: 0.5000 mg | ORAL_TABLET | Freq: Three times a day (TID) | ORAL | Status: DC | PRN

## 2015-07-21 MED ORDER — SIMVASTATIN 20 MG PO TABS: 40.0000 mg | ORAL_TABLET | Freq: Every day | ORAL | Status: DC

## 2015-07-21 MED ORDER — LINAGLIPTIN 5 MG PO TABS
5.0000 mg | ORAL_TABLET | Freq: Every day | ORAL | Status: DC
  Administered 2015-07-22: 5 mg via ORAL
  Filled 2015-07-21 (×2): qty 1

## 2015-07-21 MED ORDER — FENTANYL CITRATE (PF) 100 MCG/2ML IJ SOLN
INTRAMUSCULAR | Status: AC
  Filled 2015-07-21: qty 4

## 2015-07-21 MED ORDER — SODIUM CHLORIDE 0.9 % IR SOLN
Status: AC
  Filled 2015-07-21: qty 2

## 2015-07-21 MED ORDER — ATORVASTATIN CALCIUM 20 MG PO TABS
20.0000 mg | ORAL_TABLET | Freq: Every day | ORAL | Status: DC
  Administered 2015-07-21: 21:00:00 20 mg via ORAL
  Filled 2015-07-21: qty 1

## 2015-07-21 MED ORDER — LIDOCAINE HCL (PF) 1 % IJ SOLN
INTRAMUSCULAR | Status: AC
Start: 2015-07-21 — End: 2015-07-21
  Filled 2015-07-21: qty 60

## 2015-07-21 MED ORDER — SODIUM CHLORIDE 0.9 % IV SOLN: INTRAVENOUS | Status: AC

## 2015-07-21 MED ORDER — GABAPENTIN 400 MG PO CAPS
800.0000 mg | ORAL_CAPSULE | Freq: Three times a day (TID) | ORAL | Status: DC
  Administered 2015-07-21 – 2015-07-22 (×2): 800 mg via ORAL
  Filled 2015-07-21 (×2): qty 2
  Filled 2015-07-21: qty 8
  Filled 2015-07-21 (×2): qty 2

## 2015-07-21 MED ORDER — VANCOMYCIN HCL IN DEXTROSE 1-5 GM/200ML-% IV SOLN
1000.0000 mg | Freq: Two times a day (BID) | INTRAVENOUS | Status: AC
  Administered 2015-07-21 – 2015-07-22 (×2): 1000 mg via INTRAVENOUS
  Filled 2015-07-21 (×2): qty 200

## 2015-07-21 MED ORDER — LABETALOL HCL 5 MG/ML IV SOLN
INTRAVENOUS | Status: AC
  Filled 2015-07-21: qty 4

## 2015-07-21 MED ORDER — FAMOTIDINE IN NACL 20-0.9 MG/50ML-% IV SOLN
20.0000 mg | INTRAVENOUS | Status: AC
  Administered 2015-07-21: 20 mg via INTRAVENOUS

## 2015-07-21 MED ORDER — MELATONIN 10 MG PO CAPS: 20.0000 mg | ORAL_CAPSULE | Freq: Every day | ORAL | Status: DC

## 2015-07-21 MED ORDER — SODIUM CHLORIDE 0.9 % IR SOLN
80.0000 mg | Status: AC
  Administered 2015-07-21: 80 mg

## 2015-07-21 MED ORDER — CEFAZOLIN SODIUM-DEXTROSE 2-3 GM-% IV SOLR
INTRAVENOUS | Status: AC
  Filled 2015-07-21: qty 50

## 2015-07-21 SURGICAL SUPPLY — 7 items
CABLE SURGICAL S-101-97-12 (CABLE) ×2 IMPLANT
LEAD CAPSURE NOVUS 45CM (Lead) ×2 IMPLANT
LEAD CAPSURE NOVUS 5076-52CM (Lead) ×2 IMPLANT
PAD DEFIB LIFELINK (PAD) ×2 IMPLANT
PPM ADVISA MRI DR A2DR01 (Pacemaker) ×2 IMPLANT
SHEATH CLASSIC 7F (SHEATH) ×4 IMPLANT
TRAY PACEMAKER INSERTION (PACKS) ×2 IMPLANT

## 2015-07-21 NOTE — H&P (Signed)
      Patient Care Team: Clinton Quant, MD as PCP - General (Internal Medicine)   HPI  [name] is a 74 y.o. female Admitted for pacemaker implantation for symptomatic without reversible cause.   DOE and fatigue with pk HR 80 and junctional escape rahythm.  Echo >>Normal LV function Myoview No ischemia   DATE TEST    7/16 echo   EF 60   10/16 myoview   EF normal  no ischemia   The patient denies chest pain, dignificant shortness of breath, nocturnal dyspnea, orthopnea or peripheral edema.  There have been no palpitations, lightheadedness or syncope.    Records and Results Reviewed As above  Past Medical History  Diagnosis Date  . Dyslipidemia   . Diabetes ([place])   . Hypertension   . Chronic pain syndrome   . Restless legs     Past Surgical History  Procedure Laterality Date  . Partial nephrectomy      Current Facility-Administered Medications  Medication Dose Route Frequency Provider Last Rate Last Dose  . 0.9 %  sodium chloride infusion   Intravenous Continuous Deboraha Sprang, MD      . 0.9 %  sodium chloride infusion   Intravenous Continuous Deboraha Sprang, MD      . diphenhydrAMINE (BENADRYL) injection 25 mg  25 mg Intravenous On Call Deboraha Sprang, MD      . famotidine (PEPCID) IVPB 20 mg premix  20 mg Intravenous On Call Deboraha Sprang, MD      . gentamicin (GARAMYCIN) 80 mg in sodium chloride irrigation 0.9 % 500 mL irrigation  80 mg Irrigation On Call Deboraha Sprang, MD      . methylPREDNISolone sodium succinate (SOLU-MEDROL) 125 mg/2 mL injection 125 mg  125 mg Intravenous On Call Deboraha Sprang, MD      . vancomycin (VANCOCIN) IVPB 1000 mg/200 mL premix  1,000 mg Intravenous On Call Deboraha Sprang, MD        Allergies  Allergen Reactions  . Penicillins Other (See Comments)    Severe diarrhea   . Adhesive [Tape] Rash  . Aspirin Other (See Comments)    Stomach burning  . Iodine Itching, Swelling and Rash    Can use if Benadryl and  Prednisone are used first   . Ivp Dye [Iodinated Diagnostic Agents] Other (See Comments)    Can use if Benadryl and Prednisone are used first    . Sulfa Antibiotics Diarrhea      Review of Systems negative except from HPI and PMH  Physical Exam BP 179/65 mmHg  Pulse 51  Temp(Src) 98.6 F (37 C)  Resp 18  Ht 5\' 4"  (1.626 m)  Wt 190 lb (86.183 kg)  BMI 32.60 kg/m2  SpO2 97% Well developed and well nourished in no acute distress HENT normal E scleral and icterus clear Neck Supple JVP flat; carotids brisk and full Clear to ausculation Slow but Regular rate and rhythm, no murmurs gallops or rub Soft with active bowel sounds No clubbing cyanosis { Edema Alert and oriented, grossly normal motor and sensory function Skin Warm and Dry    Assessment and  Plan  Sinus node dysfunction-symptomatic  Obstructive sleep apnea  Dyspnea on exertion  Hypertension   Diabetes with neuropathy   For pacer today

## 2015-07-22 ENCOUNTER — Encounter (HOSPITAL_COMMUNITY): Payer: Self-pay | Admitting: Internal Medicine

## 2015-07-22 ENCOUNTER — Ambulatory Visit (HOSPITAL_COMMUNITY): Payer: Medicare Other

## 2015-07-22 ENCOUNTER — Other Ambulatory Visit: Payer: Self-pay

## 2015-07-22 DIAGNOSIS — Z880 Allergy status to penicillin: Secondary | ICD-10-CM | POA: Diagnosis not present

## 2015-07-22 DIAGNOSIS — Z959 Presence of cardiac and vascular implant and graft, unspecified: Secondary | ICD-10-CM | POA: Diagnosis not present

## 2015-07-22 DIAGNOSIS — E785 Hyperlipidemia, unspecified: Secondary | ICD-10-CM | POA: Diagnosis not present

## 2015-07-22 DIAGNOSIS — I495 Sick sinus syndrome: Secondary | ICD-10-CM

## 2015-07-22 DIAGNOSIS — I10 Essential (primary) hypertension: Secondary | ICD-10-CM | POA: Diagnosis not present

## 2015-07-22 LAB — GLUCOSE, CAPILLARY: Glucose-Capillary: 224 mg/dL — ABNORMAL HIGH (ref 65–99)

## 2015-07-22 MED ORDER — LABETALOL HCL 200 MG PO TABS: 200.0000 mg | ORAL_TABLET | Freq: Two times a day (BID) | ORAL | Status: DC

## 2015-07-22 NOTE — Discharge Summary (Signed)
ELECTROPHYSIOLOGY PROCEDURE DISCHARGE SUMMARY    Patient ID: [name],  MRN: [id number], DOB/AGE: 12-Jul-1941 74 y.o.  Admit date: 07/21/2015 Discharge date: 07/22/2015  Primary Care Physician: Clinton Quant, MD Primary Cardiologist: Dr. Bronson Ing Electrophysiologist: Dr. Caryl Comes  Primary Discharge Diagnosis:  1. Sinus node dysfunction, symptomatic bradycardia  Secondary Discharge Diagnosis:  HTN DM hyperlipidemia  Allergies  Allergen Reactions  . Penicillins Other (See Comments)    Severe diarrhea   . Adhesive [Tape] Rash  . Aspirin Other (See Comments)    Stomach burning  . Iodine Itching, Swelling and Rash    Can use if Benadryl and Prednisone are used first   . Ivp Dye [Iodinated Diagnostic Agents] Other (See Comments)    Can use if Benadryl and Prednisone are used first    . Sulfa Antibiotics Diarrhea     Procedures This Admission:  1.  Implantation of a MDT dual chamber PPM on 07/21/15 by Dr Caryl Comes.  The patient received a Medtronic MRI compatible ADVISA pulse generator serial number [id number] H.PPM,  With Medtronic MRI compatible 5076 ventricular lead serial [id number] and a Medtronic MRI compatible 5076 atrial lead serial number [id number].  There were no immediate post procedure complications. 2.  CXR on 07/22/15 demonstrated no pneumothorax status post device implantation.   Brief HPI: [name] is a 74 y.o. female was referred to electrophysiology in the outpatient setting for consideration of PPM implantation.  Past medical history includes symptomatic bradycardia, HTN, hyperlipidemia, and DM.  The patient had symptomatic bradycardia and sinus node dysfunction without reversible causes.  Risks, benefits, and alternatives to PPM implantation were reviewed with the patient who wished to proceed.   Hospital Course:  The patient was admitted and underwent implantation of a MDT PPM with details as outlined above. She was monitored on  telemetry overnight which demonstrated A. Pacing/V sensing.  Left chest was without hematoma , + mild  ecchymosis.  The device was interrogated and found to be functioning normally.  CXR was obtained and demonstrated no pneumothorax status post device implantation.  Wound care, arm mobility, and restrictions were reviewed with the patient.  The patient was examined by Dr. Caryl Comes and considered stable for discharge to home.   She has HTN and we are adding Labetolol to her regime. The patients record mentions history of syncope, but she tells me the last episode was well over 6 months ago. Valsartan is listed on her home meds, though this is an error, and marked to stop.  Physical Exam: Filed Vitals:   07/21/15 2000 07/22/15 0420 07/22/15 0425 07/22/15 0812  BP: 151/51  168/66 179/63  Pulse: 60 60  65  Temp:  97.7 F (36.5 C)  97.8 F (36.6 C)  TempSrc:  Oral  Oral  Resp: 15 16 13 19   Height:      Weight:  197 lb 12 oz (89.7 kg)    SpO2: 92% 93%  93%    Labs:   Lab Results  Component Value Date   WBC 8.4 07/21/2015   HGB 14.9 07/21/2015   HCT 44.7 07/21/2015   MCV 93.9 07/21/2015   PLT 122* 07/21/2015     Recent Labs Lab 07/21/15 1434  NA 139  K 4.8  CL 101  CO2 31  BUN 20  CREATININE 1.27*  CALCIUM 9.5  GLUCOSE 118*   07/22/15 CXR IMPRESSION: 1. Cardiac pacer with lead tips in right atrium and right ventricle. No complicating features. No pneumothorax. 2. Mild cardiomegaly. No pulmonary venous  congestion. 3. Low lung volumes with bibasilar atelectasis.  Discharge Medications:    Medication List    STOP taking these medications        valsartan 160 MG tablet  Commonly known as:  DIOVAN      TAKE these medications        acetaminophen 500 MG tablet  Commonly known as:  TYLENOL  Take 1,000 mg by mouth every 6 (six) hours as needed for moderate pain.     amLODipine 5 MG tablet  Commonly known as:  NORVASC  Take 2.5 mg by mouth daily as needed (for blood  pressure).     AZOR 5-40 MG tablet  Generic drug:  amLODipine-olmesartan  Take 1 tablet by mouth daily.     DULoxetine 20 MG capsule  Commonly known as:  CYMBALTA  Take 20 mg by mouth daily.     furosemide 20 MG tablet  Commonly known as:  LASIX  Take 20-40 mg by mouth daily as needed (swelling).     gabapentin 800 MG tablet  Commonly known as:  NEURONTIN  Take 800 mg by mouth 3 (three) times daily.     labetalol 200 MG tablet  Commonly known as:  NORMODYNE  Take 1 tablet (200 mg total) by mouth 2 (two) times daily.     LORazepam 1 MG tablet  Commonly known as:  ATIVAN  Take 0.5 mg by mouth every 8 (eight) hours as needed for anxiety.     Melatonin 10 MG Caps  Take 20 mg by mouth at bedtime.     oxyCODONE 10 mg 12 hr tablet  Generic drug:  oxyCODONE  Take 10 mg by mouth every 12 (twelve) hours.     QUININE SULFATE PO  Take 20 mg by mouth as needed (for cramps).     simvastatin 40 MG tablet  Commonly known as:  ZOCOR  Take 40 mg by mouth daily.     sitaGLIPtin 100 MG tablet  Commonly known as:  JANUVIA  Take 50 mg by mouth daily.     Vitamin D 2000 UNITS tablet  Take 2,000 Units by mouth daily.     zolpidem 10 MG tablet  Commonly known as:  AMBIEN  Take 10 mg by mouth at bedtime.        Disposition:  Discharge Instructions    Increase activity slowly    Complete by:  As directed           Follow-up Information    Follow up with [facility] On 08/01/2015.   Specialty:  Cardiology   Why:  2:00PM wound check   Contact information:   [address], [place] [address] [phone]      Follow up with Virl Axe, MD On 10/20/2015.   Specialty:  Cardiology   Why:  2:00PM   Contact information:   [address] [address] [phone]       Duration of Discharge Encounter: Greater than 30 minutes including physician time.  Venetia Night, PA-C 07/22/2015 11:39  AM

## 2015-07-22 NOTE — Discharge Instructions (Signed)
° ° °  Supplemental Discharge Instructions for  Pacemaker/Defibrillator Patients  Activity No heavy lifting or vigorous activity with your left arm for 6 to 8 weeks.  Do not raise your left arm above your head for one week.  Gradually raise your affected arm as drawn below.          07/25/15                   07/26/15                   07/27/15                 07/28/15 __  NO DRIVING for  1 week   ; you may begin driving on   [id number]  .  WOUND CARE - Keep the wound area clean and dry.  Do not get this area wet for one week. No showers for one week; you may shower on   tonight  . - The tape/steri-strips on your wound will fall off; do not pull them off.  No bandage is needed on the site.  DO  NOT apply any creams, oils, or ointments to the wound area. - If you notice any drainage or discharge from the wound, any swelling or bruising at the site, or you develop a fever > 101? F after you are discharged home, call the office at once.  Special Instructions - You are still able to use cellular telephones; use the ear opposite the side where you have your pacemaker/defibrillator.  Avoid carrying your cellular phone near your device. - When traveling through airports, show security personnel your identification card to avoid being screened in the metal detectors.  Ask the security personnel to use the hand wand. - Avoid arc welding equipment, MRI testing (magnetic resonance imaging), TENS units (transcutaneous nerve stimulators).  Call the office for questions about other devices. - Avoid electrical appliances that are in poor condition or are not properly grounded. - Microwave ovens are safe to be near or to operate.  Additional information for defibrillator patients should your device go off: - If your device goes off ONCE and you feel fine afterward, notify the device clinic nurses. - If your device goes off ONCE and you do not feel well afterward, call 911. - If your device goes off TWICE,  call 911. - If your device goes off THREE times in one day, call 911.  DO NOT DRIVE YOURSELF OR A FAMILY MEMBER WITH A DEFIBRILLATOR TO THE HOSPITAL--CALL 911.

## 2015-07-22 NOTE — Progress Notes (Signed)
       Patient Name: [name]      SUBJECTIVE:wwihtout pain or sob  Past Medical History  Diagnosis Date  . Dyslipidemia   . Diabetes ([place])   . Hypertension   . Chronic pain syndrome   . Restless legs     Scheduled Meds:  Scheduled Meds: . amLODipine  5 mg Oral Daily   And  . irbesartan  300 mg Oral Daily  . atorvastatin  20 mg Oral QHS  . cholecalciferol  2,000 Units Oral Daily  . DULoxetine  20 mg Oral Daily  . gabapentin  800 mg Oral TID  . linagliptin  5 mg Oral Daily  . oxyCODONE  10 mg Oral Q12H  . vancomycin  1,000 mg Intravenous Q12H   Continuous Infusions:  acetaminophen, amLODipine, furosemide, LORazepam, ondansetron (ZOFRAN) IV, zolpidem    PHYSICAL EXAM Filed Vitals:   07/21/15 2000 07/22/15 0420 07/22/15 0425 07/22/15 0812  BP: 151/51  168/66 179/63  Pulse: 60 60  65  Temp:  97.7 F (36.5 C)  97.8 F (36.6 C)  TempSrc:  Oral  Oral  Resp: 15 16 13 19   Height:      Weight:  197 lb 12 oz (89.7 kg)    SpO2: 92% 93%  93%   Well developed and nourished in no acute distress HENT normal Neck supple with JVP-flat Clear Pocket without hematoma, swelling or tenderness Regular rate and rhythm, no murmurs or gallops Abd-soft with active BS No Clubbing cyanosis edema Skin-warm and dry A & Oriented  Grossly normal sensory and motor function   TELEMETRY: Reviewed telemetry pt in a pacing     Intake/Output Summary (Last 24 hours) at 07/22/15 0935 Last data filed at 07/22/15 0115  Gross per 24 hour  Intake    120 ml  Output   1750 ml  Net  -1630 ml    LABS: Basic Metabolic Panel:  Recent Labs Lab 07/21/15 1434  NA 139  K 4.8  CL 101  CO2 31  GLUCOSE 118*  BUN 20  CREATININE 1.27*  CALCIUM 9.5   Cardiac Enzymes: No results for input(s): CKTOTAL, CKMB, CKMBINDEX, TROPONINI in the last 72 hours. CBC:  Recent Labs Lab 07/21/15 1434  WBC 8.4  HGB 14.9  HCT 44.7  MCV 93.9  PLT 122*   PROTIME:  Recent Labs   07/21/15 1434  LABPROT 13.6  INR 1.02   F Device Interrogation:  Dvice function normal  cxr m without pneumothorax  Atrial lead psition normal but some retraction of the ventricular lead  ASSESSMENT AND PLAN:  Active Problems:   Sinus node dysfunction ([facility])   Cardiac device in situ  Normal device function Will add labetolol  200 bid  For bp  Signed, Virl Axe MD  07/22/2015

## 2015-07-25 MED FILL — Labetalol HCl IV Soln 5 MG/ML: INTRAVENOUS | Qty: 4 | Status: AC

## 2015-07-25 NOTE — Progress Notes (Signed)
Late entry Pt was given  ancef notwithstanding the listed pen allergy and a preop order of vanco..  Query as to the extent of the allergy was diarrhea with amoxicllin   I elected with the dose mostly given to continue   vanc was given post procedure

## 2015-08-01 ENCOUNTER — Ambulatory Visit: Payer: Medicare Other

## 2015-08-25 ENCOUNTER — Ambulatory Visit (INDEPENDENT_AMBULATORY_CARE_PROVIDER_SITE_OTHER): Payer: Medicare Other | Admitting: *Deleted

## 2015-08-25 DIAGNOSIS — R001 Bradycardia, unspecified: Secondary | ICD-10-CM | POA: Diagnosis not present

## 2015-08-25 DIAGNOSIS — I495 Sick sinus syndrome: Secondary | ICD-10-CM | POA: Diagnosis not present

## 2015-08-25 LAB — CUP PACEART INCLINIC DEVICE CHECK
Battery Remaining Longevity: 148 mo
Battery Voltage: 3.1 V
Brady Statistic AP VP Percent: 0.11 %
Brady Statistic AP VS Percent: 99.59 %
Brady Statistic AS VP Percent: 0 %
Brady Statistic AS VS Percent: 0.3 %
Brady Statistic RA Percent Paced: 99.7 %
Brady Statistic RV Percent Paced: 0.11 %
Date Time Interrogation Session: 20161215165043
Implantable Lead Implant Date: 20161110
Implantable Lead Implant Date: 20161110
Implantable Lead Location: 753859
Implantable Lead Location: 753860
Implantable Lead Model: 5076
Implantable Lead Model: 5076
Lead Channel Impedance Value: 361 Ohm
Lead Channel Impedance Value: 399 Ohm
Lead Channel Impedance Value: 551 Ohm
Lead Channel Impedance Value: 608 Ohm
Lead Channel Pacing Threshold Amplitude: 0.5 V
Lead Channel Pacing Threshold Amplitude: 1 V
Lead Channel Pacing Threshold Pulse Width: 0.4 ms
Lead Channel Pacing Threshold Pulse Width: 0.4 ms
Lead Channel Sensing Intrinsic Amplitude: 3 mV
Lead Channel Sensing Intrinsic Amplitude: 8.25 mV
Lead Channel Setting Pacing Amplitude: 3.5 V
Lead Channel Setting Pacing Amplitude: 3.5 V
Lead Channel Setting Pacing Pulse Width: 0.4 ms
Lead Channel Setting Sensing Sensitivity: 2.8 mV

## 2015-08-25 NOTE — Progress Notes (Signed)
Wound check appointment s/p ppm implant 07/21/15. Steri-strips removed prior to appt by pt. Wound without redness or edema. Incision edges approximated, wound well healed. Normal device function. Thresholds, sensing, and impedances consistent with implant measurements. Device programmed at 3.5V/auto capture programmed on for extra safety margin until 3 month visit. Histogram distribution appropriate for patient and level of activity. No mode switches or high ventricular rates noted. Patient educated about wound care, arm mobility, lifting restrictions. ROV with SK 10/20/15.

## 2015-09-28 ENCOUNTER — Encounter: Payer: Self-pay | Admitting: Internal Medicine

## 2015-10-20 ENCOUNTER — Encounter: Payer: Self-pay | Admitting: Internal Medicine

## 2015-10-28 ENCOUNTER — Other Ambulatory Visit (HOSPITAL_COMMUNITY): Payer: Self-pay | Admitting: Anesthesiology

## 2015-10-28 DIAGNOSIS — M47816 Spondylosis without myelopathy or radiculopathy, lumbar region: Secondary | ICD-10-CM

## 2015-11-03 ENCOUNTER — Encounter: Payer: Self-pay | Admitting: Physician Assistant

## 2015-11-03 ENCOUNTER — Ambulatory Visit (INDEPENDENT_AMBULATORY_CARE_PROVIDER_SITE_OTHER): Payer: Medicare Other | Admitting: Physician Assistant

## 2015-11-03 VITALS — BP 128/84 | HR 93 | Ht 64.0 in | Wt 199.0 lb

## 2015-11-03 DIAGNOSIS — Z950 Presence of cardiac pacemaker: Secondary | ICD-10-CM | POA: Diagnosis not present

## 2015-11-03 DIAGNOSIS — I10 Essential (primary) hypertension: Secondary | ICD-10-CM

## 2015-11-03 DIAGNOSIS — I495 Sick sinus syndrome: Secondary | ICD-10-CM | POA: Diagnosis not present

## 2015-11-03 NOTE — Progress Notes (Signed)
Cardiology Office Note Date:  11/03/2015  Patient ID:  [name], DOB 01/06/1941, MRN [id number] PCP:  Clinton Quant, MD  Cardiologist:  Dr. Jacinta Shoe Electrophysiologist: Dr. Caryl Comes   Chief Complaint: routine scheduled visit  History of Present Illness: [name] is a 75 y.o. female with history of HLD, DM, HTN, mild VHD/AS, obesity, orthostatic syncope, last event was 5-6 years ago, and symptomatic bradycardia s/p PPM.  She comes today seen for Dr. Caryl Comes, feeling very well, denies any kind of CP, palpitations or SOB, no dizziness, near syncope or syncope.  No pain at her PPM site.   Past Medical History  Diagnosis Date  . Dyslipidemia   . Diabetes ([place])   . Hypertension   . Chronic pain syndrome   . Restless legs     Past Surgical History  Procedure Laterality Date  . Partial nephrectomy    . Ep implantable device N/A 07/21/2015    Procedure: Pacemaker Implant;  Surgeon: Deboraha Sprang, MD;  Location: [facility];  Service: Cardiovascular;  Laterality: N/A;    Current Outpatient Prescriptions  Medication Sig Dispense Refill  . acetaminophen (TYLENOL) 500 MG tablet Take 1,000 mg by mouth every 6 (six) hours as needed for moderate pain.    Marland Kitchen amLODipine (NORVASC) 10 MG tablet Take 10 mg by mouth daily.    . Cholecalciferol (VITAMIN D) 2000 UNITS tablet Take 2,000 Units by mouth daily.    . DULoxetine (CYMBALTA) 20 MG capsule Take 20 mg by mouth daily.    . furosemide (LASIX) 20 MG tablet Take 20-40 mg by mouth daily as needed (swelling).     . gabapentin (NEURONTIN) 800 MG tablet Take 800 mg by mouth 3 (three) times daily.    Marland Kitchen labetalol (NORMODYNE) 200 MG tablet Take 1 tablet (200 mg total) by mouth 2 (two) times daily. 60 tablet 3  . LORazepam (ATIVAN) 1 MG tablet Take 0.5 mg by mouth every 8 (eight) hours as needed for anxiety.     . Melatonin 10 MG CAPS Take 20 mg by mouth at bedtime.     . methotrexate (RHEUMATREX) 2.5 MG tablet Take 2.5 mg by mouth  once a week. TAKE 5 TABLETS WEEKLY    . MORPHINE SULFATE ER PO Take 15 mg by mouth.    . OxyCODONE (OXYCONTIN) 10 mg T12A 12 hr tablet Take 10 mg by mouth every 12 (twelve) hours.    . pramipexole (MIRAPEX) 0.125 MG tablet Take 0.125 mg by mouth at bedtime as needed.    . predniSONE (DELTASONE) 5 MG tablet Take 5 mg by mouth daily with breakfast.    . QUININE SULFATE PO Take 20 mg by mouth as needed (for cramps).     . simvastatin (ZOCOR) 40 MG tablet Take 40 mg by mouth daily.    . sitaGLIPtin (JANUVIA) 100 MG tablet Take 50 mg by mouth daily.    Marland Kitchen zolpidem (AMBIEN) 10 MG tablet Take 10 mg by mouth at bedtime.      No current facility-administered medications for this visit.    Allergies:   Penicillins; Adhesive; Aspirin; Iodine; Ivp dye; and Sulfa antibiotics   Social History:  The patient  reports that she quit smoking about 32 years ago. She started smoking about 62 years ago. She does not have any smokeless tobacco history on file. She reports that she does not drink alcohol or use illicit drugs.   Family History:  The patient's family history includes Sick sinus syndrome in her  brother.  ROS:  Please see the history of present illness.    All other systems are reviewed and otherwise negative.   PHYSICAL EXAM:  VS:  BP 128/84 mmHg  Pulse 93  Ht 5\' 4"  (1.626 m)  Wt 199 lb (90.266 kg)  BMI 34.14 kg/m2 BMI: Body mass index is 34.14 kg/(m^2). Well nourished, well developed, in no acute distress HEENT: normocephalic, atraumatic Neck: no JVD, carotid bruits or masses Cardiac:  normal S1, S2; RRR;  1/6 SM, no rubs, or gallops Lungs:  clear to auscultation bilaterally, no wheezing, rhonchi or rales Abd: soft, non tender MS: chronic ankle deformity L>R (she states secondary to history of multiple b/l fractures), no atrophy Ext: no edema Skin: warm and dry, no rash Neuro:  No gross deficits appreciated Psych: euthymic mood, full affect  PPM site is stable, no tethering or  discomfort   EKG:  Done today shows A pacing, V sensing PPM interrogation today: normal dvice function, A paces at 30bpm, see interrogation/pace art  06/21/15: stress myoview Lexiscan Study Highlights     Nuclear stress EF: 64%.  There was no ST segment deviation noted during stress.  The study is normal.  This is a low risk study. No ischemia identified.      04/08/15: Echocardiogram Mod LVH LA is dilated Mild Ai and AS, mod MR/TR, mild PI EF 60-65% Sinus bradycardia  Recent Labs: 06/13/2015: TSH 3.44 07/21/2015: BUN 20; Creatinine, Ser 1.27*; Hemoglobin 14.9; Platelets 122*; Potassium 4.8; Sodium 139  No results found for requested labs within last 365 days.   CrCl cannot be calculated (Patient has no serum creatinine result on file.).   Wt Readings from Last 3 Encounters:  11/03/15 199 lb (90.266 kg)  07/22/15 197 lb 12 oz (89.7 kg)  06/21/15 201 lb (91.173 kg)     Other studies reviewed: Additional studies/records reviewed today include: summarized above  DEVICE information: Medtronic MRI compatible ADVISA dual chamner PPM, implanted11/11/16 by Dr. Caryl Comes  ASSESSMENT AND PLAN:  1. Symptomatic bradycardia     PPM     site is well healed     Q3 month Carelink remotes  2. HTN     appears controlled   Disposition: F/u with q 48month carelink/remote transmissions, Dr. Lovena Le in 1 year, sooner if needed.  Current medicines are reviewed at length with the patient today.  The patient did not have any concerns regarding medicines.  Haywood Lasso, PA-C 11/03/2015 3:31 PM     [address] [place] [id number] [phone] (office)  [phone] (fax)

## 2015-11-03 NOTE — Patient Instructions (Signed)
Medication Instructions:   Your physician recommends that you continue on your current medications as directed. Please refer to the Current Medication list given to you today.   If you need a refill on your cardiac medications before your next appointment, please call your pharmacy.  Labwork:  NONE ORDER TODAY    Testing/Procedures:  NONE ORDER TODAY   Follow-Up:  Your physician wants you to follow-up in: [place] will receive a reminder letter in the mail two months in advance. If you don't receive a letter, please call our office to schedule the follow-up appointment.   Remote monitoring is used to monitor your Pacemaker of ICD from home. This monitoring reduces the number of office visits required to check your device to one time per year. It allows [place] to keep an eye on the functioning of your device to ensure it is working properly. You are scheduled for a device check from home on .02/01/16..You may send your transmission at any time that day. If you have a wireless device, the transmission will be sent automatically. After your physician reviews your transmission, you will receive a postcard with your next transmission date.    Any Other Special Instructions Will Be Listed Below (If Applicable).

## 2015-11-10 ENCOUNTER — Other Ambulatory Visit: Payer: Self-pay | Admitting: Physician Assistant

## 2015-11-14 ENCOUNTER — Ambulatory Visit (HOSPITAL_COMMUNITY): Admission: RE | Admit: 2015-11-14 | Payer: Medicare Other | Source: Ambulatory Visit

## 2015-11-28 ENCOUNTER — Ambulatory Visit (HOSPITAL_COMMUNITY)
Admission: RE | Admit: 2015-11-28 | Discharge: 2015-11-28 | Disposition: A | Discharge status: Home / Self Care | Payer: Medicare Other | Source: Ambulatory Visit | Attending: Anesthesiology | Admitting: Anesthesiology

## 2015-11-28 DIAGNOSIS — M2578 Osteophyte, vertebrae: Secondary | ICD-10-CM | POA: Insufficient documentation

## 2015-11-28 DIAGNOSIS — M5136 Other intervertebral disc degeneration, lumbar region: Secondary | ICD-10-CM | POA: Insufficient documentation

## 2015-11-28 DIAGNOSIS — M47816 Spondylosis without myelopathy or radiculopathy, lumbar region: Secondary | ICD-10-CM | POA: Diagnosis not present

## 2015-11-28 DIAGNOSIS — M4806 Spinal stenosis, lumbar region: Secondary | ICD-10-CM | POA: Insufficient documentation

## 2015-11-28 DIAGNOSIS — M4804 Spinal stenosis, thoracic region: Secondary | ICD-10-CM | POA: Insufficient documentation

## 2015-12-16 ENCOUNTER — Telehealth: Payer: Self-pay | Admitting: Internal Medicine

## 2015-12-16 NOTE — Telephone Encounter (Signed)
Walk in pt form-sealed envelope-dropped off gave to [facility]

## 2015-12-19 ENCOUNTER — Telehealth: Payer: Self-pay | Admitting: Internal Medicine

## 2015-12-19 NOTE — Telephone Encounter (Signed)
Request for surgical clearance received from [facility]- patient is pending " L4-5 decompression and in situ fusion." Per Dr. Caryl Comes- "she was last seen by [place] 10/2015; if she has no new CV symptoms, she can proceed." Form faxed to [facility] at [phone]. Confirmation received. I left a message for the patient to call.

## 2015-12-20 NOTE — Telephone Encounter (Signed)
Pt returned call to Heather-pls call @ [phone]

## 2015-12-20 NOTE — Telephone Encounter (Signed)
I spoke with the patient.  She is aware her for has been faxed back to [facility].

## 2016-01-12 ENCOUNTER — Ambulatory Visit: Payer: Self-pay | Admitting: Physician Assistant

## 2016-01-27 ENCOUNTER — Encounter (HOSPITAL_COMMUNITY): Payer: Self-pay

## 2016-01-27 ENCOUNTER — Encounter (HOSPITAL_COMMUNITY)
Admission: RE | Admit: 2016-01-27 | Discharge: 2016-01-27 | Disposition: A | Discharge status: Home / Self Care | Payer: Medicare Other | Source: Ambulatory Visit | Attending: Orthopedic Surgery | Admitting: Orthopedic Surgery

## 2016-01-27 DIAGNOSIS — Z01812 Encounter for preprocedural laboratory examination: Secondary | ICD-10-CM | POA: Diagnosis not present

## 2016-01-27 DIAGNOSIS — M4806 Spinal stenosis, lumbar region: Secondary | ICD-10-CM | POA: Insufficient documentation

## 2016-01-27 DIAGNOSIS — N183 Chronic kidney disease, stage 3 (moderate): Secondary | ICD-10-CM | POA: Insufficient documentation

## 2016-01-27 DIAGNOSIS — Z85038 Personal history of other malignant neoplasm of large intestine: Secondary | ICD-10-CM | POA: Insufficient documentation

## 2016-01-27 DIAGNOSIS — E1122 Type 2 diabetes mellitus with diabetic chronic kidney disease: Secondary | ICD-10-CM | POA: Diagnosis not present

## 2016-01-27 DIAGNOSIS — K219 Gastro-esophageal reflux disease without esophagitis: Secondary | ICD-10-CM | POA: Diagnosis not present

## 2016-01-27 DIAGNOSIS — M419 Scoliosis, unspecified: Secondary | ICD-10-CM | POA: Insufficient documentation

## 2016-01-27 DIAGNOSIS — Z950 Presence of cardiac pacemaker: Secondary | ICD-10-CM | POA: Diagnosis not present

## 2016-01-27 DIAGNOSIS — I129 Hypertensive chronic kidney disease with stage 1 through stage 4 chronic kidney disease, or unspecified chronic kidney disease: Secondary | ICD-10-CM | POA: Diagnosis not present

## 2016-01-27 DIAGNOSIS — Z0183 Encounter for blood typing: Secondary | ICD-10-CM | POA: Diagnosis not present

## 2016-01-27 DIAGNOSIS — Z87891 Personal history of nicotine dependence: Secondary | ICD-10-CM | POA: Insufficient documentation

## 2016-01-27 DIAGNOSIS — Z7984 Long term (current) use of oral hypoglycemic drugs: Secondary | ICD-10-CM | POA: Insufficient documentation

## 2016-01-27 DIAGNOSIS — Z01818 Encounter for other preprocedural examination: Secondary | ICD-10-CM | POA: Diagnosis not present

## 2016-01-27 DIAGNOSIS — Z79899 Other long term (current) drug therapy: Secondary | ICD-10-CM | POA: Insufficient documentation

## 2016-01-27 HISTORY — DX: Chronic kidney disease, unspecified: N18.9

## 2016-01-27 HISTORY — DX: Pneumonia, unspecified organism: J18.9

## 2016-01-27 HISTORY — DX: Presence of cardiac pacemaker: Z95.0

## 2016-01-27 HISTORY — DX: Unspecified cataract: H26.9

## 2016-01-27 HISTORY — DX: Anxiety disorder, unspecified: F41.9

## 2016-01-27 HISTORY — DX: Other reaction to spinal and lumbar puncture: G97.1

## 2016-01-27 HISTORY — DX: Gastro-esophageal reflux disease without esophagitis: K21.9

## 2016-01-27 HISTORY — DX: Cardiac murmur, unspecified: R01.1

## 2016-01-27 HISTORY — DX: Type 2 diabetes mellitus with diabetic neuropathy, unspecified: E11.40

## 2016-01-27 HISTORY — DX: Unspecified osteoarthritis, unspecified site: M19.90

## 2016-01-27 HISTORY — DX: Personal history of other specified conditions: Z87.898

## 2016-01-27 HISTORY — DX: Fibromyalgia: M79.7

## 2016-01-27 HISTORY — DX: Malignant (primary) neoplasm, unspecified: C80.1

## 2016-01-27 HISTORY — DX: Spinal stenosis, lumbar region without neurogenic claudication: M48.061

## 2016-01-27 HISTORY — DX: Nausea with vomiting, unspecified: R11.2

## 2016-01-27 HISTORY — DX: Other specified postprocedural states: Z98.890

## 2016-01-27 HISTORY — DX: Nontoxic single thyroid nodule: E04.1

## 2016-01-27 LAB — CBC
HCT: 39.4 % (ref 36.0–46.0)
Hemoglobin: 13.1 g/dL (ref 12.0–15.0)
MCH: 32 pg (ref 26.0–34.0)
MCHC: 33.2 g/dL (ref 30.0–36.0)
MCV: 96.1 fL (ref 78.0–100.0)
Platelets: 121 10*3/uL — ABNORMAL LOW (ref 150–400)
RBC: 4.1 MIL/uL (ref 3.87–5.11)
RDW: 14.2 % (ref 11.5–15.5)
WBC: 6.5 10*3/uL (ref 4.0–10.5)

## 2016-01-27 LAB — BASIC METABOLIC PANEL
Anion gap: 10 (ref 5–15)
BUN: 16 mg/dL (ref 6–20)
CO2: 27 mmol/L (ref 22–32)
Calcium: 9.6 mg/dL (ref 8.9–10.3)
Chloride: 104 mmol/L (ref 101–111)
Creatinine, Ser: 1.16 mg/dL — ABNORMAL HIGH (ref 0.44–1.00)
GFR calc Af Amer: 52 mL/min — ABNORMAL LOW (ref >60)
GFR calc non Af Amer: 45 mL/min — ABNORMAL LOW (ref >60)
Glucose, Bld: 161 mg/dL — ABNORMAL HIGH (ref 65–99)
Potassium: 4.5 mmol/L (ref 3.5–5.1)
Sodium: 141 mmol/L (ref 135–145)

## 2016-01-27 LAB — TYPE AND SCREEN
ABO/RH(D): O POS
Antibody Screen: NEGATIVE

## 2016-01-27 LAB — ABO/RH: ABO/RH(D): O POS

## 2016-01-27 LAB — SURGICAL PCR SCREEN
MRSA, PCR: NEGATIVE
Staphylococcus aureus: NEGATIVE

## 2016-01-27 LAB — GLUCOSE, CAPILLARY: Glucose-Capillary: 151 mg/dL — ABNORMAL HIGH (ref 65–99)

## 2016-01-27 NOTE — Pre-Procedure Instructions (Addendum)
[name]  01/27/2016      [facility] [address], [place] [address] [place] [address] Phone: [phone] Fax: [phone]    Your procedure is scheduled on Thursday, Feb 02, 2016  Report to [facility] Admitting at 5:30 A.M.  Call this number if you have problems the morning of surgery:  [phone]   Remember:  Do not eat food or drink liquids after midnight Wednesday, Feb 01, 2016  Take these medicines the morning of surgery with A SIP OF WATER :amLODipine (NORVASC), DULoxetine (CYMBALTA), gabapentin (NEURONTIN), labetalol (NORMODYNE), traMADol (ULTRAM),  If needed: acetaminophen (TYLENOL) for pain, oxymetazoline (AFRIN)  nasal spray for congestion  Stop taking Aspirin, vitamins, fish oil and herbal medications such as Melatonin and Homeopathic Products (ZICAM ALLERGY RELIEF . Do not take any NSAIDs ie: Ibuprofen, Advil, Naproxen, BC and Goody Powder or any medication containing Aspirin; stop now.   How to Manage Your Diabetes Before and After Surgery  Why is it important to control my blood sugar before and after surgery? . Improving blood sugar levels before and after surgery helps healing and can limit problems. . A way of improving blood sugar control is eating a healthy diet by: o  Eating less sugar and carbohydrates o  Increasing activity/exercise o  Talking with your doctor about reaching your blood sugar goals . High blood sugars (greater than 180 mg/dL) can raise your risk of infections and slow your recovery, so you will need to focus on controlling your diabetes during the weeks before surgery. . Make sure that the doctor who takes care of your diabetes knows about your planned surgery including the date and location.  How do I manage my blood sugar before surgery? . Check your blood sugar at least 4 times a day, starting 2 days before surgery, to make sure that the level is not too  high or low. o Check your blood sugar the morning of your surgery when you wake up and every 2 hours until you get to the Short Stay unit. . If your blood sugar is less than 70 mg/dL, you will need to treat for low blood sugar: o Do not take insulin. o Treat a low blood sugar (less than 70 mg/dL) with  cup of clear juice (cranberry or apple), 4 glucose tablets, OR glucose gel. o Recheck blood sugar in 15 minutes after treatment (to make sure it is greater than 70 mg/dL). If your blood sugar is not greater than 70 mg/dL on recheck, call [phone] for further instructions. . Report your blood sugar to the short stay nurse when you get to Short Stay.  . If you are admitted to the hospital after surgery: o Your blood sugar will be checked by the staff and you will probably be given insulin after surgery (instead of oral diabetes medicines) to make sure you have good blood sugar levels. o The goal for blood sugar control after surgery is 80-180 mg/dL.  WHAT DO I DO ABOUT MY DIABETES MEDICATION?  Marland Kitchen Do not take oral diabetes medicines (pills) the morning of surgery such as sitaGLIPtin Celesta Gentile)     Patient Signature:  Date:   Nurse Signature:  Date:   Reviewed and Endorsed by [facility] Patient Education Committee, August 2015  Do not wear jewelry, make-up or nail polish.  Do not wear lotions, powders, or perfumes.  You may not wear deodorant.  Do not shave 48 hours prior to surgery.    Do not bring valuables to the hospital.  [facility] is not responsible for any belongings or valuables.  Contacts, dentures or bridgework may not be worn into surgery.  Leave your suitcase in the car.  After surgery it may be brought to your room.  For patients admitted to the hospital, discharge time will be determined by your treatment team.  Patients discharged the day of surgery will not be allowed to drive home.   Name and phone number of your driver:   Special instructions: Shower the night  before surgery and the morning of surgery with CHG.  Please read over the following fact sheets that you were given. Pain Booklet, Coughing and Deep Breathing, Blood Transfusion Information, MRSA Information and Surgical Site Infection Prevention

## 2016-01-27 NOTE — Progress Notes (Signed)
Pt denies SOB and chest pain but is under the care of Dr. Caryl Comes, Cardiology. Pt stated that her fasting blood sugar usually ranges between 110-110. Pt denies having a cardiac cath. Requested A1c result from PCP, Dr. Harmon Pier, and recent CXR from [place]. LVM with Judeen Hammans, requesting clearance note from Dr. Caryl Comes. Pt chart forwarded to anesthesia for pre-op consult and mention of clearance ( see note in Epic 12/19/15).

## 2016-01-30 NOTE — Progress Notes (Addendum)
Anesthesia Chart Review:  Pt is a 75 year old female scheduled for L4-5 decompression on 02/02/2016 with Dr. Rolena Infante.   PCP is Dr. Margaretha Sheffield who has cleared pt for surgery, Cardiologist is Dr. Virl Axe, who has cleared pt for surgery.   PMH includes:  HTN, pacemaker ([employer]tronic, implanted 07/22/15) for symptomatic bradycardia, DM, heart murmur, CKD (stage 3), colon cancer, GERD. Former smoker. BMI 35  Anesthesia history include spinal headache and post-op N/V  Medications include: amlodipine, lasix, labetalol, methotrexate, quinine,  simvastatin, sitagliptin.   Preoperative labs reviewed.  HgbA1c was 7.1 on 12/24/15  CXR report 09/21/15 reviewed. No acute cardiopulmonary disease  EKG 11/03/15: atrial paced rhythm  Nuclear stress test 06/21/15:   Nuclear stress EF: 64%.  There was no ST segment deviation noted during stress.  The study is normal.  This is a low risk study. No ischemia identified.  Echo 04/08/15:  1. Moderate LVH 2. LA is dilated 3. Mild AI and AS, moderate MR and TR, and mild PI 4. EF 60-65% 5. Pt has sinus bradycardia  Perioperative prescription for pacemaker form pending.   If no changes, I anticipate pt can proceed with surgery as scheduled.    Willeen Cass, FNP-BC [facility] Stay [facility]/Anesthesiology Phone: [phone] 01/30/2016 4:45 PM

## 2016-01-31 NOTE — Progress Notes (Addendum)
Called [employer] and left message for rep. of pt's surgery date and time.  Spoke with [employer]. For [employer]. Gave her pt's surgery date and time.

## 2016-02-01 ENCOUNTER — Telehealth: Payer: Self-pay | Admitting: Cardiology

## 2016-02-01 ENCOUNTER — Ambulatory Visit (INDEPENDENT_AMBULATORY_CARE_PROVIDER_SITE_OTHER): Payer: Medicare Other | Admitting: *Deleted

## 2016-02-01 DIAGNOSIS — I495 Sick sinus syndrome: Secondary | ICD-10-CM | POA: Diagnosis not present

## 2016-02-01 NOTE — Progress Notes (Signed)
Remote pacemaker transmission.   

## 2016-02-01 NOTE — Telephone Encounter (Signed)
Spoke with pt and reminded pt of remote transmission that is due today. Pt verbalized understanding.   

## 2016-02-01 NOTE — Anesthesia Preprocedure Evaluation (Addendum)
Anesthesia Evaluation  Patient identified by MRN, date of birth, ID band Patient awake    Reviewed: Allergy & Precautions, NPO status , Patient's Chart, lab work & pertinent test results  History of Anesthesia Complications (+) PONV  Airway Mallampati: II  TM Distance: >3 FB Neck ROM: Full    Dental  (+) Teeth Intact, Dental Advisory Given   Pulmonary former smoker,    breath sounds clear to auscultation       Cardiovascular hypertension, + pacemaker  Rhythm:Regular Rate:Normal  Sick sinus syn, pacemaker   Neuro/Psych  Headaches,  Neuromuscular disease    GI/Hepatic GERD  ,  Endo/Other  diabetes  Renal/GU      Musculoskeletal  (+) Arthritis , Fibromyalgia -  Abdominal (+) + obese,   Peds  Hematology   Anesthesia Other Findings   Reproductive/Obstetrics                            Anesthesia Physical Anesthesia Plan  ASA: III  Anesthesia Plan: General   Post-op Pain Management:    Induction: Intravenous  Airway Management Planned: Oral ETT  Additional Equipment:   Intra-op Plan:   Post-operative Plan:   Informed Consent: I have reviewed the patients History and Physical, chart, labs and discussed the procedure including the risks, benefits and alternatives for the proposed anesthesia with the patient or authorized representative who has indicated his/her understanding and acceptance.   Dental advisory given  Plan Discussed with:   Anesthesia Plan Comments:         Anesthesia Quick Evaluation

## 2016-02-02 ENCOUNTER — Ambulatory Visit (HOSPITAL_COMMUNITY): Payer: Medicare Other | Admitting: Emergency Medicine

## 2016-02-02 ENCOUNTER — Ambulatory Visit (HOSPITAL_COMMUNITY): Payer: Medicare Other | Admitting: Anesthesiology

## 2016-02-02 ENCOUNTER — Observation Stay (HOSPITAL_COMMUNITY)
Admission: RE | Admit: 2016-02-02 | Discharge: 2016-02-03 | Disposition: A | Discharge status: Home / Self Care | Payer: Medicare Other | Source: Ambulatory Visit | Attending: Orthopedic Surgery | Admitting: Orthopedic Surgery

## 2016-02-02 ENCOUNTER — Encounter (HOSPITAL_COMMUNITY)
Admission: RE | Disposition: A | Discharge status: Home / Self Care | Payer: Self-pay | Source: Ambulatory Visit | Attending: Orthopedic Surgery

## 2016-02-02 ENCOUNTER — Ambulatory Visit (HOSPITAL_COMMUNITY): Payer: Medicare Other

## 2016-02-02 ENCOUNTER — Encounter (HOSPITAL_COMMUNITY): Payer: Self-pay | Admitting: *Deleted

## 2016-02-02 DIAGNOSIS — M25572 Pain in left ankle and joints of left foot: Secondary | ICD-10-CM | POA: Insufficient documentation

## 2016-02-02 DIAGNOSIS — M4806 Spinal stenosis, lumbar region: Secondary | ICD-10-CM | POA: Diagnosis present

## 2016-02-02 DIAGNOSIS — Z419 Encounter for procedure for purposes other than remedying health state, unspecified: Secondary | ICD-10-CM

## 2016-02-02 DIAGNOSIS — R262 Difficulty in walking, not elsewhere classified: Secondary | ICD-10-CM | POA: Insufficient documentation

## 2016-02-02 DIAGNOSIS — Z880 Allergy status to penicillin: Secondary | ICD-10-CM | POA: Insufficient documentation

## 2016-02-02 DIAGNOSIS — M419 Scoliosis, unspecified: Secondary | ICD-10-CM | POA: Insufficient documentation

## 2016-02-02 DIAGNOSIS — M797 Fibromyalgia: Secondary | ICD-10-CM | POA: Insufficient documentation

## 2016-02-02 DIAGNOSIS — F17210 Nicotine dependence, cigarettes, uncomplicated: Secondary | ICD-10-CM | POA: Diagnosis not present

## 2016-02-02 DIAGNOSIS — E119 Type 2 diabetes mellitus without complications: Secondary | ICD-10-CM | POA: Insufficient documentation

## 2016-02-02 DIAGNOSIS — M5136 Other intervertebral disc degeneration, lumbar region: Secondary | ICD-10-CM | POA: Diagnosis not present

## 2016-02-02 DIAGNOSIS — M25571 Pain in right ankle and joints of right foot: Secondary | ICD-10-CM | POA: Diagnosis not present

## 2016-02-02 DIAGNOSIS — Z79899 Other long term (current) drug therapy: Secondary | ICD-10-CM | POA: Insufficient documentation

## 2016-02-02 DIAGNOSIS — M48061 Spinal stenosis, lumbar region without neurogenic claudication: Secondary | ICD-10-CM | POA: Diagnosis present

## 2016-02-02 HISTORY — PX: LUMBAR LAMINECTOMY/DECOMPRESSION MICRODISCECTOMY: SHX5026

## 2016-02-02 LAB — GLUCOSE, CAPILLARY
Glucose-Capillary: 159 mg/dL — ABNORMAL HIGH (ref 65–99)
Glucose-Capillary: 174 mg/dL — ABNORMAL HIGH (ref 65–99)
Glucose-Capillary: 280 mg/dL — ABNORMAL HIGH (ref 65–99)
Glucose-Capillary: 238 mg/dL — ABNORMAL HIGH (ref 65–99)

## 2016-02-02 SURGERY — LUMBAR LAMINECTOMY/DECOMPRESSION MICRODISCECTOMY 1 LEVEL
Anesthesia: General | Site: Spine Lumbar

## 2016-02-02 MED ORDER — OXYCODONE HCL 5 MG PO TABS
ORAL_TABLET | ORAL | Status: AC
  Filled 2016-02-02: qty 2

## 2016-02-02 MED ORDER — SIMVASTATIN 20 MG PO TABS
40.0000 mg | ORAL_TABLET | Freq: Every day | ORAL | Status: DC
  Administered 2016-02-02: 40 mg via ORAL
  Filled 2016-02-02: qty 2

## 2016-02-02 MED ORDER — PROPOFOL 10 MG/ML IV BOLUS
INTRAVENOUS | Status: DC | PRN
  Administered 2016-02-02: 140 mg via INTRAVENOUS

## 2016-02-02 MED ORDER — INSULIN ASPART 100 UNIT/ML ~~LOC~~ SOLN: 0.0000 [IU] | Freq: Three times a day (TID) | SUBCUTANEOUS | Status: DC

## 2016-02-02 MED ORDER — BUPIVACAINE-EPINEPHRINE (PF) 0.25% -1:200000 IJ SOLN
INTRAMUSCULAR | Status: AC
  Filled 2016-02-02: qty 30

## 2016-02-02 MED ORDER — FENTANYL CITRATE (PF) 250 MCG/5ML IJ SOLN
INTRAMUSCULAR | Status: AC
  Filled 2016-02-02: qty 5

## 2016-02-02 MED ORDER — ALBUMIN HUMAN 5 % IV SOLN
INTRAVENOUS | Status: DC | PRN
  Administered 2016-02-02: 10:00:00 via INTRAVENOUS

## 2016-02-02 MED ORDER — LACTATED RINGERS IV SOLN: INTRAVENOUS | Status: DC

## 2016-02-02 MED ORDER — LACTATED RINGERS IV SOLN
INTRAVENOUS | Status: DC | PRN
  Administered 2016-02-02 (×2): via INTRAVENOUS

## 2016-02-02 MED ORDER — VANCOMYCIN HCL IN DEXTROSE 1-5 GM/200ML-% IV SOLN
INTRAVENOUS | Status: AC
  Administered 2016-02-02 (×2): 1000 mg via INTRAVENOUS
  Filled 2016-02-02: qty 200

## 2016-02-02 MED ORDER — ONDANSETRON HCL 4 MG PO TABS: 4.0000 mg | ORAL_TABLET | Freq: Three times a day (TID) | ORAL | Status: DC | PRN

## 2016-02-02 MED ORDER — BUPIVACAINE-EPINEPHRINE 0.25% -1:200000 IJ SOLN
INTRAMUSCULAR | Status: DC | PRN
  Administered 2016-02-02: 10 mL

## 2016-02-02 MED ORDER — FUROSEMIDE 20 MG PO TABS: 20.0000 mg | ORAL_TABLET | Freq: Every day | ORAL | Status: DC | PRN

## 2016-02-02 MED ORDER — PHENOL 1.4 % MT LIQD: 1.0000 | OROMUCOSAL | Status: DC | PRN

## 2016-02-02 MED ORDER — MORPHINE SULFATE (PF) 2 MG/ML IV SOLN: 1.0000 mg | INTRAVENOUS | Status: DC | PRN

## 2016-02-02 MED ORDER — LABETALOL HCL 200 MG PO TABS
200.0000 mg | ORAL_TABLET | Freq: Once | ORAL | Status: AC
  Administered 2016-02-02: 200 mg via ORAL
  Filled 2016-02-02: qty 1

## 2016-02-02 MED ORDER — MENTHOL 3 MG MT LOZG
1.0000 | LOZENGE | OROMUCOSAL | Status: DC | PRN
  Filled 2016-02-02: qty 9

## 2016-02-02 MED ORDER — PRAMIPEXOLE DIHYDROCHLORIDE 0.25 MG PO TABS
0.2500 mg | ORAL_TABLET | Freq: Every day | ORAL | Status: DC
  Administered 2016-02-02: 0.25 mg via ORAL
  Filled 2016-02-02: qty 1

## 2016-02-02 MED ORDER — GABAPENTIN 800 MG PO TABS
800.0000 mg | ORAL_TABLET | Freq: Three times a day (TID) | ORAL | Status: DC
  Filled 2016-02-02 (×2): qty 1

## 2016-02-02 MED ORDER — INSULIN ASPART 100 UNIT/ML ~~LOC~~ SOLN
0.0000 [IU] | Freq: Every day | SUBCUTANEOUS | Status: DC
  Administered 2016-02-02: 2 [IU] via SUBCUTANEOUS

## 2016-02-02 MED ORDER — OXYCODONE HCL 5 MG PO TABS
10.0000 mg | ORAL_TABLET | ORAL | Status: DC | PRN
  Administered 2016-02-02 – 2016-02-03 (×4): 10 mg via ORAL
  Filled 2016-02-02 (×3): qty 2

## 2016-02-02 MED ORDER — GABAPENTIN 400 MG PO CAPS
800.0000 mg | ORAL_CAPSULE | Freq: Three times a day (TID) | ORAL | Status: DC
  Administered 2016-02-02: 800 mg via ORAL
  Filled 2016-02-02: qty 2

## 2016-02-02 MED ORDER — FENTANYL CITRATE (PF) 100 MCG/2ML IJ SOLN
INTRAMUSCULAR | Status: DC | PRN
  Administered 2016-02-02: 250 ug via INTRAVENOUS

## 2016-02-02 MED ORDER — THROMBIN 20000 UNITS EX SOLR
CUTANEOUS | Status: AC
  Filled 2016-02-02: qty 20000

## 2016-02-02 MED ORDER — ONDANSETRON HCL 4 MG/2ML IJ SOLN: 4.0000 mg | INTRAMUSCULAR | Status: DC | PRN

## 2016-02-02 MED ORDER — HYDROMORPHONE HCL 1 MG/ML IJ SOLN
0.2500 mg | INTRAMUSCULAR | Status: DC | PRN
  Administered 2016-02-02: 1 mg via INTRAVENOUS

## 2016-02-02 MED ORDER — METHOCARBAMOL 1000 MG/10ML IJ SOLN
500.0000 mg | Freq: Four times a day (QID) | INTRAVENOUS | Status: DC | PRN
  Filled 2016-02-02: qty 5

## 2016-02-02 MED ORDER — DULOXETINE HCL 20 MG PO CPEP
20.0000 mg | ORAL_CAPSULE | Freq: Every day | ORAL | Status: DC
  Filled 2016-02-02: qty 1

## 2016-02-02 MED ORDER — AMLODIPINE BESYLATE 10 MG PO TABS
10.0000 mg | ORAL_TABLET | Freq: Every day | ORAL | Status: DC
  Administered 2016-02-02: 10 mg via ORAL
  Filled 2016-02-02 (×2): qty 1

## 2016-02-02 MED ORDER — ARTIFICIAL TEARS OP OINT
TOPICAL_OINTMENT | OPHTHALMIC | Status: DC | PRN
  Administered 2016-02-02: 1 via OPHTHALMIC

## 2016-02-02 MED ORDER — METHOCARBAMOL 500 MG PO TABS: 500.0000 mg | ORAL_TABLET | Freq: Three times a day (TID) | ORAL | Status: DC | PRN

## 2016-02-02 MED ORDER — PROPOFOL 10 MG/ML IV BOLUS
INTRAVENOUS | Status: AC
  Filled 2016-02-02: qty 40

## 2016-02-02 MED ORDER — SODIUM CHLORIDE 0.9% FLUSH
3.0000 mL | Freq: Two times a day (BID) | INTRAVENOUS | Status: DC
  Administered 2016-02-02 (×2): 3 mL via INTRAVENOUS

## 2016-02-02 MED ORDER — 0.9 % SODIUM CHLORIDE (POUR BTL) OPTIME
TOPICAL | Status: DC | PRN
  Administered 2016-02-02: 1000 mL

## 2016-02-02 MED ORDER — SODIUM CHLORIDE 0.9% FLUSH: 3.0000 mL | INTRAVENOUS | Status: DC | PRN

## 2016-02-02 MED ORDER — THROMBIN 20000 UNITS EX KIT
PACK | CUTANEOUS | Status: DC | PRN
  Administered 2016-02-02: 20 mL via TOPICAL

## 2016-02-02 MED ORDER — DIPHENHYDRAMINE HCL 25 MG PO CAPS
25.0000 mg | ORAL_CAPSULE | Freq: Four times a day (QID) | ORAL | Status: DC | PRN
  Administered 2016-02-02: 25 mg via ORAL
  Filled 2016-02-02: qty 1

## 2016-02-02 MED ORDER — LIDOCAINE HCL (CARDIAC) 20 MG/ML IV SOLN
INTRAVENOUS | Status: DC | PRN
  Administered 2016-02-02: 100 mg via INTRAVENOUS

## 2016-02-02 MED ORDER — METHOCARBAMOL 500 MG PO TABS
ORAL_TABLET | ORAL | Status: AC
  Filled 2016-02-02: qty 1

## 2016-02-02 MED ORDER — VANCOMYCIN HCL 10 G IV SOLR
1250.0000 mg | Freq: Once | INTRAVENOUS | Status: AC
  Administered 2016-02-02: 1250 mg via INTRAVENOUS
  Filled 2016-02-02: qty 1250

## 2016-02-02 MED ORDER — GLYCOPYRROLATE 0.2 MG/ML IJ SOLN
INTRAMUSCULAR | Status: DC | PRN
  Administered 2016-02-02: 0.6 mg via INTRAVENOUS

## 2016-02-02 MED ORDER — HEMOSTATIC AGENTS (NO CHARGE) OPTIME
TOPICAL | Status: DC | PRN
  Administered 2016-02-02: 1 via TOPICAL

## 2016-02-02 MED ORDER — LINAGLIPTIN 5 MG PO TABS
5.0000 mg | ORAL_TABLET | Freq: Every day | ORAL | Status: DC
  Administered 2016-02-02: 5 mg via ORAL
  Filled 2016-02-02 (×2): qty 1

## 2016-02-02 MED ORDER — OXYCODONE-ACETAMINOPHEN 10-325 MG PO TABS: 1.0000 | ORAL_TABLET | ORAL | Status: DC | PRN

## 2016-02-02 MED ORDER — HYDROMORPHONE HCL 1 MG/ML IJ SOLN
INTRAMUSCULAR | Status: AC
  Filled 2016-02-02: qty 1

## 2016-02-02 MED ORDER — PHENYLEPHRINE HCL 10 MG/ML IJ SOLN
INTRAMUSCULAR | Status: DC | PRN
  Administered 2016-02-02 (×4): 40 ug via INTRAVENOUS

## 2016-02-02 MED ORDER — ROCURONIUM BROMIDE 100 MG/10ML IV SOLN
INTRAVENOUS | Status: DC | PRN
  Administered 2016-02-02: 10 mg via INTRAVENOUS
  Administered 2016-02-02: 40 mg via INTRAVENOUS
  Administered 2016-02-02: 10 mg via INTRAVENOUS

## 2016-02-02 MED ORDER — DEXAMETHASONE SODIUM PHOSPHATE 10 MG/ML IJ SOLN
INTRAMUSCULAR | Status: DC | PRN
  Administered 2016-02-02: 8 mg via INTRAVENOUS

## 2016-02-02 MED ORDER — ONDANSETRON HCL 4 MG/2ML IJ SOLN
INTRAMUSCULAR | Status: DC | PRN
  Administered 2016-02-02: 4 mg via INTRAVENOUS

## 2016-02-02 MED ORDER — METHOCARBAMOL 500 MG PO TABS
500.0000 mg | ORAL_TABLET | Freq: Four times a day (QID) | ORAL | Status: DC | PRN
  Administered 2016-02-02 – 2016-02-03 (×3): 500 mg via ORAL
  Filled 2016-02-02 (×2): qty 1

## 2016-02-02 MED ORDER — NEOSTIGMINE METHYLSULFATE 10 MG/10ML IV SOLN
INTRAVENOUS | Status: DC | PRN
  Administered 2016-02-02: 4 mg via INTRAVENOUS

## 2016-02-02 SURGICAL SUPPLY — 62 items
BONE MATRIX VIVIGEN 5CC (Bone Implant) ×4 IMPLANT
BUR EGG ELITE 4.0 (BURR) ×2 IMPLANT
BUR MATCHSTICK NEURO 3.0 LAGG (BURR) IMPLANT
CANISTER SUCTION 2500CC (MISCELLANEOUS) ×2 IMPLANT
CLSR STERI-STRIP ANTIMIC 1/2X4 (GAUZE/BANDAGES/DRESSINGS) ×2 IMPLANT
CORDS BIPOLAR (ELECTRODE) ×2 IMPLANT
COVER SURGICAL LIGHT HANDLE (MISCELLANEOUS) ×2 IMPLANT
DRAIN CHANNEL 15F RND FF W/TCR (WOUND CARE) IMPLANT
DRAPE POUCH INSTRU U-SHP 10X18 (DRAPES) ×2 IMPLANT
DRAPE SURG 17X23 STRL (DRAPES) ×2 IMPLANT
DRAPE U-SHAPE 47X51 STRL (DRAPES) ×2 IMPLANT
DRSG AQUACEL AG ADV 3.5X 6 (GAUZE/BANDAGES/DRESSINGS) ×2 IMPLANT
DRSG MEPILEX BORDER 4X8 (GAUZE/BANDAGES/DRESSINGS) IMPLANT
DURAPREP 26ML APPLICATOR (WOUND CARE) ×2 IMPLANT
ELECT BLADE 4.0 EZ CLEAN MEGAD (MISCELLANEOUS)
ELECT CAUTERY BLADE 6.4 (BLADE) ×2 IMPLANT
ELECT PENCIL ROCKER SW 15FT (MISCELLANEOUS) ×2 IMPLANT
ELECT REM PT RETURN 9FT ADLT (ELECTROSURGICAL) ×2
ELECTRODE BLDE 4.0 EZ CLN MEGD (MISCELLANEOUS) IMPLANT
ELECTRODE REM PT RTRN 9FT ADLT (ELECTROSURGICAL) ×1 IMPLANT
EVACUATOR SILICONE 100CC (DRAIN) IMPLANT
GLOVE BIO SURGEON STRL SZ 6.5 (GLOVE) ×2 IMPLANT
GLOVE BIOGEL PI IND STRL 6.5 (GLOVE) ×1 IMPLANT
GLOVE BIOGEL PI IND STRL 8.5 (GLOVE) ×1 IMPLANT
GLOVE BIOGEL PI INDICATOR 6.5 (GLOVE) ×1
GLOVE BIOGEL PI INDICATOR 8.5 (GLOVE) ×1
GLOVE SS BIOGEL STRL SZ 8.5 (GLOVE) ×1 IMPLANT
GLOVE SUPERSENSE BIOGEL SZ 8.5 (GLOVE) ×1
GOWN STRL REUS W/TWL 2XL LVL3 (GOWN DISPOSABLE) ×4 IMPLANT
KIT BASIN OR (CUSTOM PROCEDURE TRAY) ×2 IMPLANT
KIT ROOM TURNOVER OR (KITS) ×2 IMPLANT
NEEDLE 22X1 1/2 (OR ONLY) (NEEDLE) ×2 IMPLANT
NEEDLE SPNL 18GX3.5 QUINCKE PK (NEEDLE) ×4 IMPLANT
NS IRRIG 1000ML POUR BTL (IV SOLUTION) ×2 IMPLANT
PACK LAMINECTOMY ORTHO (CUSTOM PROCEDURE TRAY) ×2 IMPLANT
PACK UNIVERSAL I (CUSTOM PROCEDURE TRAY) ×2 IMPLANT
PAD ARMBOARD 7.5X6 YLW CONV (MISCELLANEOUS) ×4 IMPLANT
PATTIES SURGICAL .5 X.5 (GAUZE/BANDAGES/DRESSINGS) IMPLANT
PATTIES SURGICAL .5 X1 (DISPOSABLE) ×2 IMPLANT
SPONGE SURGIFOAM ABS GEL 100 (HEMOSTASIS) ×2 IMPLANT
SURGIFLO W/THROMBIN 8M KIT (HEMOSTASIS) ×2 IMPLANT
SUT BONE WAX W31G (SUTURE) ×2 IMPLANT
SUT MON AB 3-0 SH 27 (SUTURE)
SUT MON AB 3-0 SH27 (SUTURE) IMPLANT
SUT STRATAFIX 1PDS 45CM VIOLET (SUTURE) ×4 IMPLANT
SUT STRATAFIX MNCRL+ 3-0 PS-2 (SUTURE) ×1
SUT STRATAFIX MONOCRYL 3-0 (SUTURE) ×1
SUT STRATAFIX SPIRAL + 2-0 (SUTURE) ×2 IMPLANT
SUT VIC AB 0 CT1 27 (SUTURE)
SUT VIC AB 0 CT1 27XBRD ANBCTR (SUTURE) IMPLANT
SUT VIC AB 1 CT1 18XCR BRD 8 (SUTURE) IMPLANT
SUT VIC AB 1 CT1 8-18 (SUTURE)
SUT VIC AB 1 CTX 36 (SUTURE)
SUT VIC AB 1 CTX36XBRD ANBCTR (SUTURE) IMPLANT
SUT VIC AB 2-0 CT1 18 (SUTURE) IMPLANT
SUTURE STRATFX MNCRL+ 3-0 PS-2 (SUTURE) ×1 IMPLANT
SYR BULB IRRIGATION 50ML (SYRINGE) ×2 IMPLANT
SYR CONTROL 10ML LL (SYRINGE) ×6 IMPLANT
TOWEL OR 17X24 6PK STRL BLUE (TOWEL DISPOSABLE) ×4 IMPLANT
TOWEL OR 17X26 10 PK STRL BLUE (TOWEL DISPOSABLE) ×2 IMPLANT
WATER STERILE IRR 1000ML POUR (IV SOLUTION) IMPLANT
YANKAUER SUCT BULB TIP NO VENT (SUCTIONS) ×2 IMPLANT

## 2016-02-02 NOTE — Evaluation (Signed)
Physical Therapy Evaluation Patient Details Name: [name] MRN: [id number] DOB: 12/27/1940 Today's Date: 02/02/2016   History of Present Illness  75 y.o. female admitted on 02/02/2016 s/p lumbar DECOMPRESSION L4-L5 WITH INSITE 2 FUSION  Clinical Impression  Patient seen for evaluation, mobility assessment and eduction re spinal surgery. Patient mobilizing well with cane, educated on spinal precautions with good receptivity. Patient will have supervision and assist upon discharge from family. No further acute PT needs at this time. Will sign off.     Follow Up Recommendations No PT follow up;Supervision for mobility/OOB    Equipment Recommendations  None recommended by PT    Recommendations for Other Services       Precautions / Restrictions Precautions Precautions: Back Precaution Booklet Issued: Yes (comment) Precaution Comments: verbally reviewed with patient Required Braces or Orthoses: Spinal Brace Spinal Brace: Lumbar corset;Applied in sitting position Restrictions Weight Bearing Restrictions: No      Mobility  Bed Mobility Overal bed mobility: Needs Assistance Bed Mobility: Rolling;Sidelying to Sit Rolling: Supervision Sidelying to sit: Supervision       General bed mobility comments: VCs for technique no physical assist required  Transfers Overall transfer level: Needs assistance Equipment used: Straight cane Transfers: Sit to/from Stand Sit to Stand: Supervision         General transfer comment: Vcs for technique and positioning  Ambulation/Gait Ambulation/Gait assistance: Supervision   Assistive device: Straight cane Gait Pattern/deviations: Step-through pattern;Decreased stride length;Antalgic;Wide base of support Gait velocity: decreased Gait velocity interpretation: Below normal speed for age/gender General Gait Details: some modest instability noted with ambulation, cues for increased cadence to improve stability  Stairs             Wheelchair Mobility    Modified Rankin (Stroke Patients Only)       Balance                                             Pertinent Vitals/Pain Pain Assessment: 0-10 Pain Score: 7  Pain Location: back Pain Descriptors / Indicators: Aching Pain Intervention(s): Monitored during session;Repositioned;Patient requesting pain meds-RN notified    Home Living Family/patient expects to be discharged to:: Private residence Living Arrangements: Children Available Help at Discharge: Family;Available 24 hours/day (planning to stay at daughters house initially) Type of Home: House Home Access: Stairs to enter Entrance Stairs-Rails: Can reach both Entrance Stairs-Number of Steps: 3 Home Layout: Two level Home Equipment: Cane - single point;Shower seat - built in      Prior Function Level of Independence: Independent with assistive device(s)         Comments: cane     Hand Dominance   Dominant Hand: Right    Extremity/Trunk Assessment   Upper Extremity Assessment: Defer to OT evaluation           Lower Extremity Assessment: Overall WFL for tasks assessed;LLE deficits/detail   LLE Deficits / Details: left foot inversion deformity at baseline     Communication   Communication: No difficulties  Cognition Arousal/Alertness: Awake/alert Behavior During Therapy: WFL for tasks assessed/performed Overall Cognitive Status: Within Functional Limits for tasks assessed                      General Comments      Exercises        Assessment/Plan    PT Assessment Patent does not need any  further PT services  PT Diagnosis Difficulty walking;Abnormality of gait;Acute pain   PT Problem List    PT Treatment Interventions     PT Goals (Current goals can be found in the Care Plan section) Acute Rehab PT Goals PT Goal Formulation: All assessment and education complete, DC therapy    Frequency     Barriers to discharge         Co-evaluation               End of Session Equipment Utilized During Treatment: Gait belt;Back brace Activity Tolerance: Patient tolerated treatment well Patient left: in bed;with call bell/phone within reach;with family/visitor present Nurse Communication: Mobility status    Functional Assessment Tool Used: clinical judgement Functional Limitation: Mobility: Walking and moving around Mobility: Walking and Moving Around Current Status [id number]): At least 1 percent but less than 20 percent impaired, limited or restricted Mobility: Walking and Moving Around Goal Status [phone]): At least 1 percent but less than 20 percent impaired, limited or restricted Mobility: Walking and Moving Around Discharge Status [phone]): At least 1 percent but less than 20 percent impaired, limited or restricted    Time: [id number] PT Time Calculation (min) (ACUTE ONLY): 14 min   Charges:   PT Evaluation $PT Eval Low Complexity: 1 Procedure     PT G Codes:   PT G-Codes **NOT FOR INPATIENT CLASS** Functional Assessment Tool Used: clinical judgement Functional Limitation: Mobility: Walking and moving around Mobility: Walking and Moving Around Current Status [id number]): At least 1 percent but less than 20 percent impaired, limited or restricted Mobility: Walking and Moving Around Goal Status [phone]): At least 1 percent but less than 20 percent impaired, limited or restricted Mobility: Walking and Moving Around Discharge Status [phone]): At least 1 percent but less than 20 percent impaired, limited or restricted    Duncan Dull 02/02/2016, 5:25 PM  Alben Deeds, [place] DPT  [phone]

## 2016-02-02 NOTE — Progress Notes (Signed)
[employer] tech/ Mosie Epstein on floor to reprogram/dro[p rate on pacer. Pt arrived floor pain fre, stating " I am fine"

## 2016-02-02 NOTE — Evaluation (Signed)
Occupational Therapy Evaluation and Discharge Patient Details Name: [name] MRN: [id number] DOB: [birth year] Today's Date: 02/02/2016    History of Present Illness 75 y.o. female admitted on 02/02/2016 s/p lumbar DECOMPRESSION L4-L5 WITH INSITE 2 FUSION   Clinical Impression   Pt reports she was independent with ADLs PTA. Currently pt overall supervision with ADLs and functional mobility with the exception of min assist for LB ADLs. All back, safety, and ADL education complete; pt with no further questions or concerns for OT at this time. Pt planning to d/c to daughters home for a few weeks where she will have 24/7 supervision. No further acute OT needs identified; signing off at this time. Please re-consult if needs change. Thank you for this referral.    Follow Up Recommendations  No OT follow up;Supervision - Intermittent (initially)    Equipment Recommendations  3 in 1 bedside comode    Recommendations for Other Services       Precautions / Restrictions Precautions Precautions: Back Precaution Booklet Issued: Yes (comment) Precaution Comments: Pt able to recall 2/3 back precautions; reviewed all precautions Required Braces or Orthoses: Spinal Brace Spinal Brace: Lumbar corset;Applied in sitting position Restrictions Weight Bearing Restrictions: No      Mobility Bed Mobility Overal bed mobility: Needs Assistance Bed Mobility: Rolling;Sidelying to Sit Rolling: Supervision Sidelying to sit: Supervision       General bed mobility comments: VCs for technique no physical assist required  Transfers Overall transfer level: Needs assistance Equipment used: Straight cane Transfers: Sit to/from Stand Sit to Stand: Supervision         General transfer comment: Vcs for technique and positioning    Balance Overall balance assessment: Needs assistance Sitting-balance support: Feet supported;No upper extremity supported Sitting balance-Leahy Scale: Good      Standing balance support: No upper extremity supported;During functional activity Standing balance-Leahy Scale: Fair Standing balance comment: Pt able to stand at sink and wash hands without UE support                            ADL Overall ADL's : Needs assistance/impaired Eating/Feeding: Independent;Sitting   Grooming: Supervision/safety;Standing;Wash/dry hands Grooming Details (indicate cue type and reason): Educated pt on use of 2 cups for oral care. Upper Body Bathing: Supervision/ safety;Sitting   Lower Body Bathing: Minimal assistance;Sit to/from stand   Upper Body Dressing : Supervision/safety;Set up;Sitting Upper Body Dressing Details (indicate cue type and reason): Pt reports no difficulties donning back brace Lower Body Dressing: Minimal assistance;Sit to/from stand Lower Body Dressing Details (indicate cue type and reason): Educated on compensatory strategies for LB ADLs. Daughter can assist as needed upon return home. Toilet Transfer: Supervision/safety;Ambulation;BSC (SPC, BSC over toilet)   Toileting- Clothing Manipulation and Hygiene: Supervision/safety;Sitting/lateral lean Toileting - Clothing Manipulation Details (indicate cue type and reason): for toilet hygiene. Pt able to perform peri care without twisting.     Functional mobility during ADLs: Supervision/safety [facility]) General ADL Comments: Educated pt on maintaining back precautions during functional activities, keeping frequently used items at counter top height, use of 3 in 1 over toilet and in shower as a seat, sitting for safety when bathing, supervision for shower transfers.     Vision     Perception     Praxis      Pertinent Vitals/Pain Pain Assessment: 0-10 Pain Score: 7  Pain Location: back Pain Descriptors / Indicators: Aching Pain Intervention(s): Monitored during session;RN gave pain meds during session  Hand Dominance Right   Extremity/Trunk Assessment Upper Extremity  Assessment Upper Extremity Assessment: Overall WFL for tasks assessed   Lower Extremity Assessment Lower Extremity Assessment: Defer to PT evaluation LLE Deficits / Details: left foot inversion deformity at baseline   Cervical / Trunk Assessment Cervical / Trunk Assessment: Other exceptions Cervical / Trunk Exceptions: s/p lumbar sx   Communication Communication Communication: No difficulties   Cognition Arousal/Alertness: Awake/alert Behavior During Therapy: WFL for tasks assessed/performed Overall Cognitive Status: Within Functional Limits for tasks assessed                     General Comments       Exercises       Shoulder Instructions      Home Living Family/patient expects to be discharged to:: Private residence Living Arrangements: Children Available Help at Discharge: Family;Available 24 hours/day (planning to stay at daughters house initially) Type of Home: House Home Access: Stairs to enter [employer] of Steps: 3 Entrance Stairs-Rails: Can reach both Home Layout: Two level Alternate Level Stairs-Number of Steps: 12 Alternate Level Stairs-Rails: Can reach both Bathroom Shower/Tub: Occupational psychologist: Standard     Home Equipment: Cane - single point;Shower seat - built in          Prior Functioning/Environment Level of Independence: Independent with assistive device(s)        Comments: cane    OT Diagnosis: Generalized weakness;Acute pain   OT Problem List:     OT Treatment/Interventions:      OT Goals(Current goals can be found in the care plan section) Acute Rehab OT Goals Patient Stated Goal: return home OT Goal Formulation: All assessment and education complete, DC therapy  OT Frequency:     Barriers to D/C:            Co-evaluation              End of Session Equipment Utilized During Treatment: Back brace;Other (comment) [facility]) Nurse Communication: Other (comment) (pt needs 3 in 1 for  home)  Activity Tolerance: Patient tolerated treatment well Patient left: Other (comment) (in hallway with PT)   Time: [id number] OT Time Calculation (min): 18 min Charges:  OT General Charges $OT Visit: 1 Procedure OT Evaluation $OT Eval Moderate Complexity: 1 Procedure G-Codes:      Binnie Kand M.S., OTR/L Pager: [phone]  02/02/2016, 5:35 PM

## 2016-02-02 NOTE — Progress Notes (Signed)
Spoke with Tomi Bamberger with [employer]. Will be here by [phone]

## 2016-02-02 NOTE — Discharge Instructions (Signed)
Ok to shower in 5 days Brace when out of bed

## 2016-02-02 NOTE — H&P (Signed)
History of Present Illness The patient is a 75 year old female who comes in today for a preoperative History and Physical. The patient is scheduled for a L4-4 decompression with insitu fusion to be performed by Dr. Duane Lope D. Rolena Infante, MD at [facility] on 02-02-16 . Please see the hospital record for complete dictated history and physical.  Additional reasons for visit:  Transition into care is described as the following: The patient is transitioning into care and a summary of care was reviewed.   Problem List/Past Medical Chronic pain of both ankles (M25.571, M25.572)  Posterior tibial tendonitis, left [id number])  Pain, foot, chronic, left (M79.672)  Tight heelcords, acquired, left (M67.02)  Spinal stenosis, lumbar (M48.06)  Scoliosis due to degenerative disease of spine in adult patient (M41.50)  Pes planus of both feet (M21.41, M21.42)  Spondylolisthesis of lumbar region (M43.16)  Problems Reconciled   Allergies  Aspirin (Salicylates)  Sulfa Drugs  Codeine/Codeine Derivatives  Iodinated Contrast  Penicillins  Allergies Reconciled   Family History  Cerebrovascular Accident  Brother. Congestive Heart Failure  Mother. Diabetes Mellitus  Brother. Heart Disease  Brother. Hypertension  Mother.  Social History Children  2 Current drinker  07/08/2015: Currently drinks wine only occasionally per week Current work status  retired Exercise  Exercises never Living situation  live alone Marital status  widowed No history of drug/alcohol rehab  Number of flights of stairs before winded  less than 1 Previously addicted to/Dependent on drugs or pain medications  Tobacco / smoke exposure  07/08/2015: no Tobacco use  Former smoker. 07/08/2015: smoke(d) 3/4 pack(s) per day Under pain contract   Medication History  TraMADol HCl (50MG  Tablet, 1 (one) Tablet Oral tid prn, Taken starting 12/30/2015) Active. (rx called to [facility] at [phone]  per ddb/smt 12/30/15) Gabapentin (800MG  Tablet, Oral) Active. Zolpidem Tartrate (10MG  Tablet, Oral) Active. DULoxetine HCl (20MG  Capsule DR Part, Oral) Active. Januvia (100MG  Tablet, Oral) Active. Simvastatin (40MG  Tablet, Oral) Active. Vitamin D (2000UNIT Tablet, Oral) Active. Melatonin (10MG  Capsule, Oral) Active. Furosemide (20MG  Tablet, Oral) Active. AmLODIPine Besylate (5MG  Tablet, Oral) Active. Labetalol HCl (200MG  Tablet, Oral) Active. Methotrexate (2.5MG  Tablet, Oral) Active. Medications Reconciled  Past Surgical History  Appendectomy  Breast Biopsy  right Colectomy  partial Dilation and Curettage of Uterus  Hemorrhoidectomy  Kidney Removal  right Tonsillectomy  Tubal Ligation   Other Problems  Anxiety Disorder  Chronic Pain  Colon Cancer  Diabetes Mellitus, Type II  Fibromyalgia  High blood pressure  Hypercholesterolemia  Migraine Headache  Osteoarthritis  Peripheral Neuropathy   Vitals  01/27/2016 9:42 AM Weight: 199 lb Height: 65in Weight was reported by patient. Height was reported by patient. Body Surface Area: 1.97 m Body Mass Index: 33.11 kg/m  Temp.: 98.46F(Oral)  Pulse: 81 (Regular)  BP: 172/89 (Sitting, Right Arm, Standard)  General General Appearance-Not in acute distress. Orientation-Oriented X3. Build & Nutrition-Well nourished and Well developed.  Integumentary General Characteristics Surgical Scars - no surgical scar evidence of previous lumbar surgery. Lumbar Spine-Skin examination of the lumbar spine is without deformity, skin lesions, lacerations or abrasions.  Chest and Lung Exam Auscultation Breath sounds - Normal and Clear.  Cardiovascular Auscultation Rhythm - Regular rate and rhythm.  Abdomen Palpation/Percussion Palpation and Percussion of the abdomen reveal - Soft, Non Tender and No Rebound tenderness.  Peripheral Vascular Lower Extremity Palpation - Posterior  tibial pulse - Bilateral - 1+. Dorsalis pedis pulse - Bilateral - 1+.  Neurologic Sensation Lower Extremity - Left - sensation  is intact in the lower extremity. Right - sensation is diminished in the lower extremity. Reflexes Patellar Reflex - Bilateral - 1+. Achilles Reflex - Bilateral - 1+. Clonus - Bilateral - clonus not present. Hoffman's Sign - Bilateral - Hoffman's sign not present. Testing Seated Straight Leg Raise - Bilateral - Seated straight leg raise negative.  Musculoskeletal Spine/Ribs/Pelvis  Lumbosacral Spine: Inspection and Palpation - Tenderness - left lumbar paraspinals tender to palpation and right lumbar paraspinals tender to palpation. Strength and Tone: Strength - Hip Flexion - Bilateral - 5/5. Knee Extension - Bilateral - 5/5. Knee Flexion - Bilateral - 5/5. Ankle Dorsiflexion - Bilateral - 5/5. Ankle Plantarflexion - Bilateral - 5/5. ROM - Flexion - moderately decreased range of motion and painful. Extension - moderately decreased range of motion and painful. Left Lateral Bending - moderately decreased range of motion and painful. Right Lateral Bending - moderately decreased range of motion and painful. Right Rotation - moderately decreased range of motion and painful. Left Rotation - moderately decreased range of motion and painful. Pain - . Lumbosacral Spine - Waddell's Signs - no Waddell's signs present. Lower Extremity Range of Motion - No true hip, knee or ankle pain with range of motion. Gait and Station - [employer] - no assistive devices. Note: limp is present with ambulation because of past ankle fracture     Assessment & Plan  Goal Of Surgery: Discussed that goal of surgery is to reduce pain and improve function and quality of life. Patient is aware that despite all appropriate treatment that there pain and function could be the same, worse, or different. Spondylolisthesis of lumbar region (M43.16) Scoliosis due to degenerative disease of spine in  adult patient (M41.50)   Assessments Transcription A very pleasant 75 year old female patient with low back pain and radicular pain. The patient is scheduled for a L4-5 decompression with in situ fusion. She is headed downstairs to get a LSO. She understands to bring that to her surgery and then obviously it will be placed on her while inpatient. The patient has severe low back pain. It radiates into the left thigh and buttock. She does have bilateral ankle pain, left side being worse than right. She has fractured the left ankle in the past, which has resulted in some ligament disruption. She is neurologically intact. She does have a limp from the chronic ankle pain on the left. Sensation is diminished globally in the lower extremity. The patient also has her appointment scheduled at the hospital for her presurgical appointment with anesthesiologist. She will present on the 25th for her surgery. All questions have been elicited and answered.

## 2016-02-02 NOTE — Transfer of Care (Signed)
Immediate Anesthesia Transfer of Care Note  Patient: [name]  Procedure(s) Performed: Procedure(s): DECOMPRESSION L4-L5 WITH INSITE 2 FUSION  (N/A)  Patient Location: PACU  Anesthesia Type:General  Level of Consciousness: awake, alert , oriented and sedated  Airway & Oxygen Therapy: Patient Spontanous Breathing and Patient connected to nasal cannula oxygen  Post-op Assessment: Report given to RN, Post -op Vital signs reviewed and stable and Patient moving all extremities  Post vital signs: Reviewed and stable  Last Vitals:  Filed Vitals:   02/02/16 0634  BP: 156/59  Pulse: 64  Temp: 36.7 C  Resp: 20    Last Pain: There were no vitals filed for this visit.    Patients Stated Pain Goal: 2 ([id number])  Complications: No apparent anesthesia complications

## 2016-02-02 NOTE — Anesthesia Procedure Notes (Addendum)
Procedure Name: Intubation Date/Time: 02/02/2016 7:48 AM Performed by: Scheryl Darter Pre-anesthesia Checklist: Patient identified, Emergency Drugs available, Suction available and Patient being monitored Patient Re-evaluated:Patient Re-evaluated prior to inductionOxygen Delivery Method: Circle System Utilized Preoxygenation: Pre-oxygenation with 100% oxygen Intubation Type: IV induction Ventilation: Mask ventilation without difficulty Laryngoscope Size: Glidescope and 3 Tube type: Oral Tube size: 7.0 mm Number of attempts: 1 Airway Equipment and Method: Stylet,  Oral airway and Video-laryngoscopy Placement Confirmation: ETT inserted through vocal cords under direct vision,  positive ETCO2 and breath sounds checked- equal and bilateral Secured at: 22 cm Tube secured with: Tape Dental Injury: Teeth and Oropharynx as per pre-operative assessment  Difficulty Due To: Difficulty was anticipated, Difficult Airway- due to anterior larynx and Difficult Airway- due to limited oral opening

## 2016-02-02 NOTE — Progress Notes (Signed)
Pharmacy Antibiotic Note  [name] is a 75 y.o. female admitted on 02/02/2016 with surgical prophylaxis.  Pharmacy has been consulted for vancomycin dosing.  Patient s/p lumbar decompression- no drain placed. Received pre-op vancomycin 1g at 0740 this morning.  Plan: -vancomycin 1250mg  IV x1 to be given tonight at [facility] to sign off as no further doses required  Height: 5' 3.5" (161.3 cm) Weight: 199 lb (90.266 kg) IBW/kg (Calculated) : 53.55  Temp (24hrs), Avg:97.7 F (36.5 C), Min:97.3 F (36.3 C), Max:98.1 F (36.7 C)   Recent Labs Lab 01/27/16 1305  WBC 6.5  CREATININE 1.16*    Estimated Creatinine Clearance: 44.5 mL/min (by C-G formula based on Cr of 1.16).    Allergies  Allergen Reactions  . Penicillins Other (See Comments)    Severe diarrhea Has patient had a PCN reaction causing immediate rash, facial/tongue/throat swelling, SOB or lightheadedness with hypotension: No Has patient had a PCN reaction causing severe rash involving mucus membranes or skin necrosis: No Has patient had a PCN reaction that required hospitalization No Has patient had a PCN reaction occurring within the last 10 years: No If all of the above answers are "NO", then may proceed with Cephalosporin use.   . Adhesive [Tape] Rash  . Aspirin Other (See Comments)    Stomach burning  . Iodine Itching, Swelling and Rash    Can use if Benadryl and Prednisone are used first   . Ivp Dye [Iodinated Diagnostic Agents] Other (See Comments)    Can use if Benadryl and Prednisone are used first    . Sulfa Antibiotics Diarrhea    Keddrick Wyne D. Valaria Kohut, PharmD, BCPS Clinical Pharmacist Pager: [phone] 02/02/2016 12:58 PM

## 2016-02-02 NOTE — Brief Op Note (Signed)
02/02/2016  10:46 AM  PATIENT:  [name]  75 y.o. female  PRE-OPERATIVE DIAGNOSIS:  LUMBAR SPINAL STENSOIS SCOLIOSIS   POST-OPERATIVE DIAGNOSIS:  LUMBAR SPINAL STENSOIS SCOLIOSIS   PROCEDURE:  Procedure(s): DECOMPRESSION L4-L5 WITH INSITE 2 FUSION  (N/A)  SURGEON:  Surgeon(s) and Role:    * Melina Schools, MD - Primary  PHYSICIAN ASSISTANT:   ASSISTANTS: none   ANESTHESIA:   general  EBL:  Total I/O In: 1000 [I.V.:1000] Out: 800 [Blood:800]  BLOOD ADMINISTERED:none  DRAINS: none   LOCAL MEDICATIONS USED:  MARCAINE     SPECIMEN:  No Specimen  DISPOSITION OF SPECIMEN:  N/A  COUNTS:  YES  TOURNIQUET:  * No tourniquets in log *  DICTATION: .Other Dictation: Dictation Number [id number]  PLAN OF CARE: Admit to inpatient   PATIENT DISPOSITION:  PACU - hemodynamically stable.

## 2016-02-02 NOTE — Op Note (Signed)
[name], [name]               ACCOUNT NO.:  [id number]  MEDICAL RECORD NO.:  [id number]  LOCATION:  [facility]                         FACILITY:  [facility]  PHYSICIAN:  Kalila Adkison D. Rolena Infante, M.D. DATE OF BIRTH:  23-May-1941  DATE OF PROCEDURE:  02/02/2016 DATE OF DISCHARGE:                              OPERATIVE REPORT   PREOPERATIVE DIAGNOSES: 1. Lumbar spinal stenosis. 2. Degenerative lumbar spondylolisthesis.  POSTOPERATIVE DIAGNOSES: 1. Lumbar spinal stenosis. 2. Degenerative lumbar spondylolisthesis.  OPERATIVE PROCEDURE:  Lumbar decompression, L4-5 with in-situ fusion using autograft bone graft from local source as well as ViviGen supplemental bone graft material.  COMPLICATIONS:  None.  CONDITION:  Stable.  HISTORY:  This is a very pleasant 75 year old woman with progressive debilitating back, buttock, and bilateral leg pain.  She is losing her balance, having difficulty walking and her quality of life is severely impaired.  Imaging studies demonstrated severe lumbar spinal stenosis at L4-5 with a degenerative slip as well as multilevel degenerative disk disease and underlying degenerative scoliosis.  After discussing treatment options, we elected to proceed with just doing the decompression and in-situ fusion.  The goal of the surgery was to reduce her neurogenic claudication pain and improved her quality of life with minimizing the potential risk of progression of the spondylolisthesis.  All appropriate risks, benefits, and alternatives to surgery were discussed with the patient and consent was obtained.  OPERATIVE NOTE:  The patient was brought to the operating room and placed supine on the operating table.  After successful induction of general anesthesia and endotracheal intubation, TEDs, SCDs were applied. She was turned prone onto the [place] frame.  All bony prominences were well padded and the back was prepped and draped in a standard fashion. Time-out was taken to  confirm patient, procedure, and all other pertinent important data.  Once this was completed, two needles were placed in the back and an intraoperative fluoro view was taken to confirm incision site.  Once this was done, I infiltrated the proposed incision site, made a midline incision and sharply dissected down to the deep fascia.  Deep fascia was sharply incised using Cobb and Bovie, I stripped the paraspinal muscles to expose the L5 and L4 spinous process, lamina, and the L4-5 facet complex.  Once the facet was exposed bilaterally, I then placed a Penfield 4 underneath the lamina of L4 and took an x-ray to confirm that I was at the appropriate level.  Once I confirmed this, I removed the facet capsule at L4-5 and exposed this and then exposed out into the posterolateral gutter exposing the L5 transverse process.  Once this was done, I then dissected superiorly and identified the L4 transverse process.  Care was taken not to violate the facet capsule here at L3-4.  I then repeated this on the contralateral side.  At this point, I had an excellent posterior approach exposing the transverse processes for the posterolateral fusion and the L4-5 level. I then removed the majority of the spinous process of L4 with the double- action Leksell rongeur and then using 2 and 3-mm Kerrison punch, I began performing a central laminotomy.  I then expanded this.  There was significant thickening of  the ligamentum flavum.  Once I had an adequate bony central decompression, I then used Penfield 4 to dissect through the central raphe of the ligamentum flavum and exposed the underlying thecal sac.  I removed the central portion of the ligamentum flavum and exposed the dorsal surface of the thecal sac.  I then continued into the lateral recess with a 2 and 3-mm Kerrison punch, completing my central decompression and lateral recess decompression.  There was significant compression of the thecal sac consistent  with what was seen on the preoperative imaging studies.  I then proceeded inferiorly and undercut the leading edge of the L5 lamina in order to complete the central decompression.  I then identified the L5 nerve root and traced it into the foramen decompressing this as well.  I could palpate the medial border of the L5 pedicle.  This confirmed that I had an adequate lateral recess decompression.  I then went superiorly until I could palpate the inferior aspect of the L4 pedicle.  At this point, with the lateral recess and foraminal decompression complete, I was pleased with this. There was no evidence of CSF leak.  There was still large epidural vein, which I coagulated with bipolar electrocautery.  At this point, the bone that I had harvested from the decompression was reprepped.  I then used a high-speed bur to decorticate the lateral border of the facet complex and the transverse process.  I then placed the bone graft plus ViviGen bone graft extender into the posterolateral gutter.  Once this was complete, I irrigated the wound copiously with normal saline and then made sure I had hemostasis using FloSeal and bipolar electrocautery.  I then placed a thrombin-soaked Gelfoam patty over the exposed laminotomy site and then closed the fascia with a running barbed STRATAFIX suture, superficial with a 2-0 barbed suture and a 3-0 Monocryl for the skin. Steri-Strips and dry dressing were applied.  The patient was ultimately extubated, transferred to the PACU without incident.  At the end of the case, all needle and sponge counts were correct.  There were no adverse intraoperative events.     Idalia Allbritton D. Rolena Infante, M.D.     DDB/MEDQ  D:  02/02/2016  T:  02/02/2016  Job:  [id number]

## 2016-02-03 ENCOUNTER — Encounter (HOSPITAL_COMMUNITY): Payer: Self-pay | Admitting: Orthopedic Surgery

## 2016-02-03 DIAGNOSIS — M4806 Spinal stenosis, lumbar region: Secondary | ICD-10-CM | POA: Diagnosis not present

## 2016-02-03 LAB — GLUCOSE, CAPILLARY: Glucose-Capillary: 160 mg/dL — ABNORMAL HIGH (ref 65–99)

## 2016-02-03 MED FILL — Thrombin For Soln 20000 Unit: CUTANEOUS | Qty: 1 | Status: AC

## 2016-02-03 NOTE — Progress Notes (Signed)
    Subjective: Procedure(s) (LRB): DECOMPRESSION L4-L5 WITH INSITE 2 FUSION  (N/A) 1 Day Post-Op  Patient reports pain as 2 on 0-10 scale.  Reports none leg pain reports incisional back pain   Positive void Negative bowel movement Positive flatus Negative chest pain or shortness of breath  Objective: Vital signs in last 24 hours: Temp:  [97.3 F (36.3 C)-99.1 F (37.3 C)] 98.5 F (36.9 C) (05/26 0729) Pulse Rate:  [60-70] 69 (05/26 0729) Resp:  [10-21] 18 (05/26 0729) BP: (105-174)/(50-88) 105/88 mmHg (05/26 0729) SpO2:  [91 %-100 %] 96 % (05/26 0729)  Intake/Output from previous day: 05/25 0701 - 05/26 0700 In: 1930 [P.O.:480; I.V.:1200; IV Piggyback:250] Out: 800 [Blood:800]  Labs: No results for input(s): WBC, RBC, HCT, PLT in the last 72 hours. No results for input(s): NA, K, CL, CO2, BUN, CREATININE, GLUCOSE, CALCIUM in the last 72 hours. No results for input(s): LABPT, INR in the last 72 hours.  Physical Exam: Neurologically intact Neurovascular intact Intact pulses distally Incision: dressing C/D/I Compartment soft  Assessment/Plan: Patient stable  xrays n/a Continue mobilization with physical therapy Continue care  Advance diet Up with therapy  plan on d/c to home Doing well  Melina Schools, MD [place] [phone]

## 2016-02-03 NOTE — Progress Notes (Signed)
Patient alert and oriented, mae's well, voiding adequate amount of urine, swallowing without difficulty, c/o mild pain. Patient discharged home with family. Script and discharged instructions given to patient. Patient and family stated understanding of d/c instructions given and has an appointment with MD.   

## 2016-02-19 LAB — CUP PACEART REMOTE DEVICE CHECK
Battery Remaining Longevity: 114 mo
Battery Voltage: 3.03 V
Brady Statistic AP VP Percent: 0.16 %
Brady Statistic AP VS Percent: 99.46 %
Brady Statistic AS VP Percent: 0 %
Brady Statistic AS VS Percent: 0.38 %
Brady Statistic RA Percent Paced: 99.62 %
Brady Statistic RV Percent Paced: 0.16 %
Date Time Interrogation Session: 20170524142815
Implantable Lead Implant Date: 20161110
Implantable Lead Implant Date: 20161110
Implantable Lead Location: 753859
Implantable Lead Location: 753860
Implantable Lead Model: 5076
Implantable Lead Model: 5076
Lead Channel Impedance Value: 380 Ohm
Lead Channel Impedance Value: 437 Ohm
Lead Channel Impedance Value: 532 Ohm
Lead Channel Impedance Value: 627 Ohm
Lead Channel Pacing Threshold Amplitude: 0.5 V
Lead Channel Pacing Threshold Amplitude: 1 V
Lead Channel Pacing Threshold Pulse Width: 0.4 ms
Lead Channel Pacing Threshold Pulse Width: 0.4 ms
Lead Channel Sensing Intrinsic Amplitude: 3.5 mV
Lead Channel Sensing Intrinsic Amplitude: 3.5 mV
Lead Channel Sensing Intrinsic Amplitude: 8.625 mV
Lead Channel Sensing Intrinsic Amplitude: 8.625 mV
Lead Channel Setting Pacing Amplitude: 1.5 V
Lead Channel Setting Pacing Amplitude: 2 V
Lead Channel Setting Pacing Pulse Width: 0.4 ms
Lead Channel Setting Sensing Sensitivity: 2.8 mV

## 2016-02-21 NOTE — Discharge Summary (Signed)
Physician Discharge Summary  Patient ID: [name] MRN: [id number] DOB/AGE: 10/05/1940 75 y.o.  Admit date: 02/02/2016 Discharge date: 02/21/2016  Admission Diagnoses:  Lumbar spondylolisthesis and stenosis  Discharge Diagnoses:  Active Problems:   Spinal stenosis at L4-L5 level   Past Medical History  Diagnosis Date  . Dyslipidemia   . Diabetes ([place])   . Hypertension   . Chronic pain syndrome   . Restless legs   . Lumbar spinal stenosis     and scoliosis  . PONV (postoperative nausea and vomiting)   . Spinal headache   . Presence of permanent cardiac pacemaker   . Heart murmur   . Pneumonia   . Thyroid nodule   . Anxiety   . Chronic kidney disease     stage 3  . GERD (gastroesophageal reflux disease)   . H/O syncope   . Diabetic neuropathy ([place])   . Arthritis   . Fibromyalgia   . Cancer ([place])     colon  . Early cataracts, bilateral     Surgeries: Procedure(s): DECOMPRESSION L4-L5 WITH INSITE 2 FUSION  on 02/02/2016   Consultants (if any):    Discharged Condition: Improved  Hospital Course: [name] is an 75 y.o. female who was admitted 02/02/2016 with a diagnosis of Lumbar Spondylolisthesis and stenosis and went to the operating room on 02/02/2016 and underwent the above named procedures. The pt was discharged on 02/03/16.   She was given perioperative antibiotics:  Anti-infectives    Start     Dose/Rate Route Frequency Ordered Stop   02/02/16 1930  vancomycin (VANCOCIN) 1,250 mg in sodium chloride 0.9 % 250 mL IVPB     1,250 mg 166.7 mL/hr over 90 Minutes Intravenous  Once 02/02/16 1259 02/02/16 2043   02/02/16 0530  vancomycin (VANCOCIN) 1-5 GM/200ML-% IVPB    Comments:  Scronce, Trina   : cabinet override      02/02/16 0530 02/02/16 0840    .  She was given sequential compression devices, early ambulation, and TED for DVT prophylaxis.  She benefited maximally from the hospital stay and there were no complications.    Recent vital signs:   Filed Vitals:   02/03/16 0520 02/03/16 0729  BP: 139/52 105/88  Pulse: 65 69  Temp: 99.1 F (37.3 C) 98.5 F (36.9 C)  Resp: 18 18    Recent laboratory studies:  Lab Results  Component Value Date   HGB 13.1 01/27/2016   HGB 14.9 07/21/2015   Lab Results  Component Value Date   WBC 6.5 01/27/2016   PLT 121* 01/27/2016   Lab Results  Component Value Date   INR 1.02 07/21/2015   Lab Results  Component Value Date   NA 141 01/27/2016   K 4.5 01/27/2016   CL 104 01/27/2016   CO2 27 01/27/2016   BUN 16 01/27/2016   CREATININE 1.16* 01/27/2016   GLUCOSE 161* 01/27/2016    Discharge Medications:     Medication List    STOP taking these medications        acetaminophen 500 MG tablet  Commonly known as:  TYLENOL     methotrexate 2.5 MG tablet  Commonly known as:  RHEUMATREX     traMADol 50 MG tablet  Commonly known as:  ULTRAM      TAKE these medications        amLODipine 10 MG tablet  Commonly known as:  NORVASC  Take 10 mg by mouth daily.     DULoxetine 20 MG capsule  Commonly known as:  CYMBALTA  Take 20 mg by mouth daily.     furosemide 20 MG tablet  Commonly known as:  LASIX  Take 20-40 mg by mouth daily as needed (swelling).     gabapentin 800 MG tablet  Commonly known as:  NEURONTIN  Take 800 mg by mouth 3 (three) times daily.     labetalol 200 MG tablet  Commonly known as:  NORMODYNE  TAKE 1 TABLET(200 MG) BY MOUTH TWICE DAILY     Melatonin 10 MG Caps  Take 20 mg by mouth at bedtime.     methocarbamol 500 MG tablet  Commonly known as:  ROBAXIN  Take 1 tablet (500 mg total) by mouth 3 (three) times daily as needed for muscle spasms.     ondansetron 4 MG tablet  Commonly known as:  ZOFRAN  Take 1 tablet (4 mg total) by mouth every 8 (eight) hours as needed for nausea or vomiting.     oxyCODONE-acetaminophen 10-325 MG tablet  Commonly known as:  PERCOCET  Take 1 tablet by mouth every 4 (four) hours as needed for pain.      oxymetazoline 0.05 % nasal spray  Commonly known as:  AFRIN  Place 1 spray into both nostrils 2 (two) times daily as needed for congestion.     pramipexole 0.125 MG tablet  Commonly known as:  MIRAPEX  Take 0.25 mg by mouth at bedtime.     QUININE SULFATE PO  Take 260 mg by mouth at bedtime.     simvastatin 40 MG tablet  Commonly known as:  ZOCOR  Take 40 mg by mouth daily.     sitaGLIPtin 100 MG tablet  Commonly known as:  JANUVIA  Take 50 mg by mouth daily.     Vitamin D 2000 units tablet  Take 2,000 Units by mouth daily.     ZICAM ALLERGY RELIEF NA  Place 1 spray into the nose daily.     zolpidem 10 MG tablet  Commonly known as:  AMBIEN  Take 10 mg by mouth at bedtime.        Diagnostic Studies: Dg Lumbar Spine 1 View  02-12-2016  CLINICAL DATA:  Lumbar spine surgery. EXAM: DG C-ARM 61-120 MIN; LUMBAR SPINE - 1 VIEW COMPARISON:  11/28/2015. FINDINGS: Metallic marker noted posteriorly at L5. Normal alignment. No acute bony abnormality. IMPRESSION: Metallic marker noted posteriorly at L5. Electronically Signed   By: Marcello Moores  Register   On: 2016-02-12 10:12   Dg C-arm 1-60 Min  Feb 12, 2016  CLINICAL DATA:  Lumbar spine surgery. EXAM: DG C-ARM 61-120 MIN; LUMBAR SPINE - 1 VIEW COMPARISON:  11/28/2015. FINDINGS: Metallic marker noted posteriorly at L5. Normal alignment. No acute bony abnormality. IMPRESSION: Metallic marker noted posteriorly at L5. Electronically Signed   By: Marcello Moores  Register   On: Feb 12, 2016 10:12    Disposition: 01-Home or Self Care        Follow-up Information    Follow up with Dahlia Bailiff, MD In 2 weeks.   Specialty:  Orthopedic Surgery   Why:  For suture removal, For wound re-check, If symptoms worsen   Contact information:   [address] [place] [address] [id number]        Signed: Valinda Hoar 02/21/2016, 12:47 PM

## 2016-02-22 NOTE — Anesthesia Postprocedure Evaluation (Signed)
Anesthesia Post Note  Patient: [name]  Procedure(s) Performed: Procedure(s) (LRB): DECOMPRESSION L4-L5 WITH INSITE 2 FUSION  (N/A)  Patient location during evaluation: PACU Anesthesia Type: General Level of consciousness: awake and alert Pain management: pain level controlled Vital Signs Assessment: post-procedure vital signs reviewed and stable Respiratory status: spontaneous breathing, nonlabored ventilation, respiratory function stable and patient connected to nasal cannula oxygen Cardiovascular status: blood pressure returned to baseline and stable Postop Assessment: no signs of nausea or vomiting Anesthetic complications: no    Last Vitals:  Filed Vitals:   02/03/16 0520 02/03/16 0729  BP: 139/52 105/88  Pulse: 65 69  Temp: 37.3 C 36.9 C  Resp: 18 18    Last Pain:  Filed Vitals:   02/03/16 0729  PainSc: 3                  Tillmon Kisling,JAMES TERRILL

## 2016-02-28 ENCOUNTER — Encounter: Payer: Self-pay | Admitting: Cardiology

## 2016-03-02 NOTE — Progress Notes (Signed)
Late entry for missed gcode    02/02/16 1728  OT Time Calculation  OT Start Time (ACUTE ONLY) 1655  OT Stop Time (ACUTE ONLY) 1713  OT Time Calculation (min) 18 min  OT G-codes **NOT FOR INPATIENT CLASS**  Functional Assessment Tool Used Clinical judgement  Functional Limitation Self care  Self Care Current Status [id number] CI  Self Care Goal Status [id number] CI  Self Care Discharge Status [id number]) CI  OT General Charges  $OT Visit 1 Procedure  OT Evaluation  $OT Eval Moderate Complexity 1 Procedure   Truman Hayward, M.S., OTR/L Pager: [phone]

## 2016-05-02 ENCOUNTER — Ambulatory Visit (INDEPENDENT_AMBULATORY_CARE_PROVIDER_SITE_OTHER): Payer: Medicare Other | Admitting: *Deleted

## 2016-05-02 DIAGNOSIS — I495 Sick sinus syndrome: Secondary | ICD-10-CM

## 2016-05-02 NOTE — Progress Notes (Signed)
Remote pacemaker transmission.   

## 2016-05-09 ENCOUNTER — Encounter: Payer: Self-pay | Admitting: Cardiology

## 2016-05-15 LAB — CUP PACEART REMOTE DEVICE CHECK
Battery Remaining Longevity: 108 mo
Battery Voltage: 3.02 V
Brady Statistic AP VP Percent: 0.18 %
Brady Statistic AP VS Percent: 98.95 %
Brady Statistic AS VP Percent: 0 %
Brady Statistic AS VS Percent: 0.87 %
Brady Statistic RA Percent Paced: 99.13 %
Brady Statistic RV Percent Paced: 0.18 %
Date Time Interrogation Session: 20170823125427
Implantable Lead Implant Date: 20161110
Implantable Lead Implant Date: 20161110
Implantable Lead Location: 753859
Implantable Lead Location: 753860
Implantable Lead Model: 5076
Implantable Lead Model: 5076
Lead Channel Impedance Value: 361 Ohm
Lead Channel Impedance Value: 418 Ohm
Lead Channel Impedance Value: 494 Ohm
Lead Channel Impedance Value: 570 Ohm
Lead Channel Pacing Threshold Amplitude: 0.5 V
Lead Channel Pacing Threshold Amplitude: 1 V
Lead Channel Pacing Threshold Pulse Width: 0.4 ms
Lead Channel Pacing Threshold Pulse Width: 0.4 ms
Lead Channel Sensing Intrinsic Amplitude: 3.625 mV
Lead Channel Sensing Intrinsic Amplitude: 3.625 mV
Lead Channel Sensing Intrinsic Amplitude: 7.125 mV
Lead Channel Sensing Intrinsic Amplitude: 7.125 mV
Lead Channel Setting Pacing Amplitude: 1.5 V
Lead Channel Setting Pacing Amplitude: 2.25 V
Lead Channel Setting Pacing Pulse Width: 0.4 ms
Lead Channel Setting Sensing Sensitivity: 2.8 mV

## 2016-08-01 ENCOUNTER — Telehealth: Payer: Self-pay | Admitting: Cardiology

## 2016-08-01 ENCOUNTER — Ambulatory Visit (INDEPENDENT_AMBULATORY_CARE_PROVIDER_SITE_OTHER): Payer: Medicare Other | Admitting: *Deleted

## 2016-08-01 DIAGNOSIS — I495 Sick sinus syndrome: Secondary | ICD-10-CM

## 2016-08-01 NOTE — Progress Notes (Signed)
Remote pacemaker transmission.   

## 2016-08-01 NOTE — Telephone Encounter (Signed)
Spoke with pt and reminded pt of remote transmission that is due today. Pt verbalized understanding.   

## 2016-08-09 ENCOUNTER — Encounter: Payer: Self-pay | Admitting: Cardiology

## 2016-08-18 LAB — CUP PACEART INCLINIC DEVICE CHECK
Battery Remaining Longevity: 110 mo
Battery Voltage: 3.02 V
Brady Statistic AP VP Percent: 0.25 %
Brady Statistic AP VS Percent: 98.07 %
Brady Statistic AS VP Percent: 0 %
Brady Statistic AS VS Percent: 1.68 %
Brady Statistic RA Percent Paced: 98.32 %
Brady Statistic RV Percent Paced: 0.25 %
Date Time Interrogation Session: 20171122180551
Implantable Lead Implant Date: 20161110
Implantable Lead Implant Date: 20161110
Implantable Lead Location: 753859
Implantable Lead Location: 753860
Implantable Lead Model: 5076
Implantable Lead Model: 5076
Implantable Pulse Generator Implant Date: 20161110
Lead Channel Impedance Value: 380 Ohm
Lead Channel Impedance Value: 418 Ohm
Lead Channel Impedance Value: 494 Ohm
Lead Channel Impedance Value: 570 Ohm
Lead Channel Pacing Threshold Amplitude: 0.5 V
Lead Channel Pacing Threshold Amplitude: 1 V
Lead Channel Pacing Threshold Pulse Width: 0.4 ms
Lead Channel Pacing Threshold Pulse Width: 0.4 ms
Lead Channel Sensing Intrinsic Amplitude: 3.375 mV
Lead Channel Sensing Intrinsic Amplitude: 3.375 mV
Lead Channel Sensing Intrinsic Amplitude: 7.375 mV
Lead Channel Sensing Intrinsic Amplitude: 7.375 mV
Lead Channel Setting Pacing Amplitude: 1.5 V
Lead Channel Setting Pacing Amplitude: 2.25 V
Lead Channel Setting Pacing Pulse Width: 0.4 ms
Lead Channel Setting Sensing Sensitivity: 2.8 mV

## 2016-09-11 ENCOUNTER — Other Ambulatory Visit: Payer: Self-pay | Admitting: Internal Medicine

## 2016-10-09 ENCOUNTER — Other Ambulatory Visit: Payer: Self-pay | Admitting: Internal Medicine

## 2016-11-05 ENCOUNTER — Telehealth: Payer: Self-pay | Admitting: Internal Medicine

## 2016-11-05 NOTE — Telephone Encounter (Signed)
New message   Pt would like to know if her pacemaker is "on demand" or if her other dr will be able to implant a neuro stimulator without affecting it. She states there is no surgery planned but she is just curious.

## 2016-11-06 ENCOUNTER — Ambulatory Visit: Payer: Medicare Other | Admitting: *Deleted

## 2016-11-06 ENCOUNTER — Other Ambulatory Visit: Payer: Self-pay | Admitting: Internal Medicine

## 2016-11-06 ENCOUNTER — Encounter: Payer: Medicare Other | Admitting: Internal Medicine

## 2016-11-06 NOTE — Telephone Encounter (Signed)
Spoke with patient and informed her that whomever is doing the surgery would need to fax over a clearance form for surgery, pt stated that she thought they would need someone from the company there, informed pt that was acceptable as well. Pt asked since she cancelled her apt with Dr. Caryl Comes today should she send in a remote transmission informed pt that was ok. Pt stated that she would send a transmission in today.

## 2016-11-07 NOTE — Progress Notes (Signed)
Transmission not received / opened in error

## 2016-11-09 ENCOUNTER — Ambulatory Visit (INDEPENDENT_AMBULATORY_CARE_PROVIDER_SITE_OTHER): Payer: Medicare Other | Admitting: *Deleted

## 2016-11-09 DIAGNOSIS — I495 Sick sinus syndrome: Secondary | ICD-10-CM

## 2016-11-15 ENCOUNTER — Encounter: Payer: Self-pay | Admitting: Cardiology

## 2016-11-15 NOTE — Progress Notes (Signed)
Remote pacemaker transmission.   

## 2016-11-16 LAB — CUP PACEART REMOTE DEVICE CHECK
Battery Remaining Longevity: 99 mo
Battery Voltage: 3.01 V
Brady Statistic AP VP Percent: 0.26 %
Brady Statistic AP VS Percent: 98.82 %
Brady Statistic AS VP Percent: 0 %
Brady Statistic AS VS Percent: 0.91 %
Brady Statistic RA Percent Paced: 98.97 %
Brady Statistic RV Percent Paced: 0.26 %
Date Time Interrogation Session: 20180302215129
Implantable Lead Implant Date: 20161110
Implantable Lead Implant Date: 20161110
Implantable Lead Location: 753859
Implantable Lead Location: 753860
Implantable Lead Model: 5076
Implantable Lead Model: 5076
Implantable Pulse Generator Implant Date: 20161110
Lead Channel Impedance Value: 380 Ohm
Lead Channel Impedance Value: 418 Ohm
Lead Channel Impedance Value: 513 Ohm
Lead Channel Impedance Value: 570 Ohm
Lead Channel Pacing Threshold Amplitude: 0.5 V
Lead Channel Pacing Threshold Amplitude: 0.875 V
Lead Channel Pacing Threshold Pulse Width: 0.4 ms
Lead Channel Pacing Threshold Pulse Width: 0.4 ms
Lead Channel Sensing Intrinsic Amplitude: 2.625 mV
Lead Channel Sensing Intrinsic Amplitude: 2.625 mV
Lead Channel Sensing Intrinsic Amplitude: 7.375 mV
Lead Channel Sensing Intrinsic Amplitude: 7.375 mV
Lead Channel Setting Pacing Amplitude: 1.5 V
Lead Channel Setting Pacing Amplitude: 2 V
Lead Channel Setting Pacing Pulse Width: 0.4 ms
Lead Channel Setting Sensing Sensitivity: 2.8 mV

## 2016-11-29 ENCOUNTER — Telehealth: Payer: Self-pay | Admitting: Internal Medicine

## 2016-11-29 NOTE — Telephone Encounter (Signed)
FYI

## 2016-11-29 NOTE — Telephone Encounter (Signed)
New message    Dr. Nelva Bush at [facility] orthopedics has sent dr. Caryl Comes a note about pacemaker that they need answered as soon as possible. Just a heads up per [name]

## 2016-11-30 NOTE — Telephone Encounter (Signed)
Note that was received from Dr. Nelva Bush was in regards to the patient having spinal cord stimulator.  Dr. Caryl Comes had contacted [employer] rep prior to leaving last week and stated he received an article that he needed to review.  Will follow up with Dr. Caryl Comes next week when he returns.

## 2016-12-03 NOTE — Telephone Encounter (Signed)
I will need to discuss the [employer] recommendations with Dr Nelva Bush  Neurostimulator interaction with implanted cardiac devices - When a patient's medical condition requires both a neurostimulator and an implanted cardiac device (eg, pacemaker, defibrillator), physicians involved with both devices (eg, neurologist, neurosurgeon, cardiologist, cardiac surgeon) should discuss the possible interactions between the devices before surgery. To minimize or prevent the effects described below, implant the devices on opposite sides of the body  The electrical pulses from the neurostimulation system may interact with the sensing operation from a cardiac device and could result in an inappropriate response of the cardiac device.   Program the cardiac device to bipolar sensing and if necessary adjust the sensitivity so it doesn't sense any activity from the stimulator.stimulators usually in the buttocks- need to be 20 cm or 8 inches away from [employer] interaction with other active implanted devices - When a patient has aand another active implanted device (eg, pacemaker, defibrillator, neurostimulator):\pard? The radio-frequency (RF) signal used to program these devices may reset or reprogram the other device.\f2 ? The magnet in a cardiac programmer may activate magnetically controlled functions of the neurostimulator.\pardTo verify that inadvertent programming did not occur  clinicians familiar with each device should check the programmed parameters of each device before the patient is discharged from the hospital and after each programming session of either device (or as soon as possible after these times)  >> inform patients to contact their physician immediately if they experience symptoms that could be related to either device or to the medical condition treated by either device.

## 2016-12-06 NOTE — Telephone Encounter (Signed)
Dr. Caryl Comes called and spoke with Dr. Nelva Bush regarding the patient's spinal cord stimulator- Dr. Caryl Comes states patient will be ok for the procedure as long as her device is monitored.

## 2016-12-18 ENCOUNTER — Encounter (INDEPENDENT_AMBULATORY_CARE_PROVIDER_SITE_OTHER): Payer: Self-pay

## 2016-12-18 ENCOUNTER — Encounter: Payer: Self-pay | Admitting: Internal Medicine

## 2016-12-18 ENCOUNTER — Ambulatory Visit (INDEPENDENT_AMBULATORY_CARE_PROVIDER_SITE_OTHER): Payer: Medicare Other | Admitting: Internal Medicine

## 2016-12-18 VITALS — BP 126/64 | HR 70 | Ht 63.0 in | Wt 203.0 lb

## 2016-12-18 DIAGNOSIS — I495 Sick sinus syndrome: Secondary | ICD-10-CM

## 2016-12-18 DIAGNOSIS — Z950 Presence of cardiac pacemaker: Secondary | ICD-10-CM | POA: Diagnosis not present

## 2016-12-18 LAB — CUP PACEART INCLINIC DEVICE CHECK
Battery Remaining Longevity: 99 mo
Battery Voltage: 3.01 V
Brady Statistic AP VP Percent: 0.25 %
Brady Statistic AP VS Percent: 98.62 %
Brady Statistic AS VP Percent: 0 %
Brady Statistic AS VS Percent: 1.13 %
Brady Statistic RA Percent Paced: 98.76 %
Brady Statistic RV Percent Paced: 0.25 %
Date Time Interrogation Session: 20180410174757
Implantable Lead Implant Date: 20161110
Implantable Lead Implant Date: 20161110
Implantable Lead Location: 753859
Implantable Lead Location: 753860
Implantable Lead Model: 5076
Implantable Lead Model: 5076
Implantable Pulse Generator Implant Date: 20161110
Lead Channel Impedance Value: 399 Ohm
Lead Channel Impedance Value: 437 Ohm
Lead Channel Impedance Value: 475 Ohm
Lead Channel Impedance Value: 513 Ohm
Lead Channel Pacing Threshold Amplitude: 0.5 V
Lead Channel Pacing Threshold Amplitude: 1 V
Lead Channel Pacing Threshold Pulse Width: 0.4 ms
Lead Channel Pacing Threshold Pulse Width: 0.4 ms
Lead Channel Sensing Intrinsic Amplitude: 10.125 mV
Lead Channel Setting Pacing Amplitude: 1.5 V
Lead Channel Setting Pacing Amplitude: 2 V
Lead Channel Setting Pacing Pulse Width: 0.4 ms
Lead Channel Setting Sensing Sensitivity: 2.8 mV

## 2016-12-18 NOTE — Patient Instructions (Signed)
Medication Instructions: - Your physician recommends that you continue on your current medications as directed. Please refer to the Current Medication list given to you today.  Labwork: - none ordered  Procedures/Testing: - none ordered  Follow-Up: - Remote monitoring is used to monitor your Pacemaker of ICD from home. This monitoring reduces the number of office visits required to check your device to one time per year. It allows [place] to keep an eye on the functioning of your device to ensure it is working properly. You are scheduled for a device check from home on 03/19/17. You may send your transmission at any time that day. If you have a wireless device, the transmission will be sent automatically. After your physician reviews your transmission, you will receive a postcard with your next transmission date.  - Your physician wants you to follow-up in: 9 months with Dr. Caryl Comes. You will receive a reminder letter in the mail two months in advance. If you don't receive a letter, please call our office to schedule the follow-up appointment.   Any Additional Special Instructions Will Be Listed Below (If Applicable).     If you need a refill on your cardiac medications before your next appointment, please call your pharmacy.

## 2016-12-18 NOTE — Progress Notes (Signed)
Patient Care Team: Clinton Quant, MD as PCP - General (Internal Medicine)   HPI  [name] is a 76 y.o. female Seen in followup for pacemaker implanted 2016 for symptomatic sinus node dysfunction She has been much improved   She has days where she feels fatigued but without chest pain or swelling  Spoke with dr Nelva Bush regarding the concurrent use of neurostimulator  Past Medical History:  Diagnosis Date  . Anxiety   . Arthritis   . Cancer ([place])    colon  . Chronic kidney disease    stage 3  . Chronic pain syndrome   . Diabetes ([place])   . Diabetic neuropathy ([place])   . Dyslipidemia   . Early cataracts, bilateral   . Fibromyalgia   . GERD (gastroesophageal reflux disease)   . H/O syncope   . Heart murmur   . Hypertension   . Lumbar spinal stenosis    and scoliosis  . Pneumonia   . PONV (postoperative nausea and vomiting)   . Presence of permanent cardiac pacemaker   . Restless legs   . Spinal headache   . Thyroid nodule     Past Surgical History:  Procedure Laterality Date  . APPENDECTOMY    . BREAST SURGERY     lumpectomy  . COLON SURGERY    . DILATION AND CURETTAGE OF UTERUS    . EP IMPLANTABLE DEVICE N/A 07/21/2015   Procedure: Pacemaker Implant;  Surgeon: Deboraha Sprang, MD;  Location: [facility];  Service: Cardiovascular;  Laterality: N/A;  . LUMBAR LAMINECTOMY/DECOMPRESSION MICRODISCECTOMY N/A 02/02/2016   Procedure: DECOMPRESSION L4-L5 WITH INSITE 2 FUSION ;  Surgeon: Melina Schools, MD;  Location: [facility];  Service: Orthopedics;  Laterality: N/A;  . PARTIAL NEPHRECTOMY    . TONSILLECTOMY    . TUBAL LIGATION      Current Outpatient Prescriptions  Medication Sig Dispense Refill  . Cholecalciferol (VITAMIN D) 2000 UNITS tablet Take 2,000 Units by mouth daily.    . DULoxetine (CYMBALTA) 20 MG capsule Take 20 mg by mouth daily.    . fenofibrate 160 MG tablet Take 160 mg by mouth daily.    Marland Kitchen gabapentin (NEURONTIN) 800 MG tablet Take  800 mg by mouth 3 (three) times daily.    . Homeopathic Products (ZICAM ALLERGY RELIEF NA) Place 1 spray into the nose daily.    Marland Kitchen labetalol (NORMODYNE) 200 MG tablet TAKE 1 TABLET(200 MG) BY MOUTH TWICE DAILY 60 tablet 2  . lisinopril (PRINIVIL,ZESTRIL) 20 MG tablet Take 20 mg by mouth daily.    . Melatonin 10 MG CAPS Take 20 mg by mouth at bedtime.     . methocarbamol (ROBAXIN) 500 MG tablet Take 1 tablet (500 mg total) by mouth 3 (three) times daily as needed for muscle spasms. 60 tablet 0  . QUININE SULFATE PO Take 260 mg by mouth at bedtime.    . simvastatin (ZOCOR) 40 MG tablet Take 40 mg by mouth daily.    . sitaGLIPtin (JANUVIA) 100 MG tablet Take 50 mg by mouth daily.     No current facility-administered medications for this visit.     Allergies  Allergen Reactions  . Penicillins Other (See Comments)    Severe diarrhea Has patient had a PCN reaction causing immediate rash, facial/tongue/throat swelling, SOB or lightheadedness with hypotension: No Has patient had a PCN reaction causing severe rash involving mucus membranes or skin necrosis: No Has patient had a PCN reaction that required  hospitalization No Has patient had a PCN reaction occurring within the last 10 years: No If all of the above answers are "NO", then may proceed with Cephalosporin use.   . Adhesive [Tape] Rash  . Aspirin Other (See Comments)    Stomach burning  . Iodine Itching, Swelling and Rash    Can use if Benadryl and Prednisone are used first   . Ivp Dye [Iodinated Diagnostic Agents] Other (See Comments)    Can use if Benadryl and Prednisone are used first    . Sulfa Antibiotics Diarrhea      Review of Systems negative except from HPI and PMH  Physical Exam BP 126/64   Pulse 70   Ht 5\' 3"  (1.6 m)   Wt 203 lb (92.1 kg)   SpO2 96%   BMI 35.96 kg/m  Well developed and well nourished in no acute distress HENT normal E scleral and icterus clear Neck Supple JVP flat; carotids brisk and  full Clear to ausculation Device pocket well healed; without hematoma or erythema.  There is no tethering  Regular rate and rhythm, no murmurs gallops or rub Soft with active bowel sounds No clubbing cyanosis  Edema Alert and oriented, grossly normal motor and sensory function Skin Warm and Dry  ECG personally reviewed Apacing 74\20/09/42  Assessment and  Plan  Sinus node dysfunction-symptomatic  Obstructive sleep apnea  Dyspnea on exertion  Hypertension   Atrial fib duration < 2hrs   Preoperative assessment  Remains unwilling to pursue sleep study as not willing to do CPAP  No significant atrial fibrillation; duration not long enough to justify anticoagulation   BP well controlled and no edema with discontuation of amlodipine  Should be at acceptable risk of neurostimulator procedure   Will need to have pacer rep there to assure no device interaction   Current medicines are reviewed at length with the patient today .  The patient does not have concerns regarding medicines.

## 2017-01-29 ENCOUNTER — Other Ambulatory Visit: Payer: Self-pay | Admitting: Internal Medicine

## 2017-02-01 ENCOUNTER — Encounter: Payer: Medicare Other | Admitting: Internal Medicine

## 2017-03-19 ENCOUNTER — Ambulatory Visit (INDEPENDENT_AMBULATORY_CARE_PROVIDER_SITE_OTHER): Payer: Medicare Other | Admitting: *Deleted

## 2017-03-19 DIAGNOSIS — I495 Sick sinus syndrome: Secondary | ICD-10-CM

## 2017-03-19 NOTE — Progress Notes (Signed)
Remote pacemaker transmission.   

## 2017-03-26 ENCOUNTER — Encounter: Payer: Self-pay | Admitting: Cardiology

## 2017-04-01 LAB — CUP PACEART REMOTE DEVICE CHECK
Battery Remaining Longevity: 98 mo
Battery Voltage: 3.01 V
Brady Statistic AP VP Percent: 1.2 %
Brady Statistic AP VS Percent: 98.27 %
Brady Statistic AS VP Percent: 0 %
Brady Statistic AS VS Percent: 0.53 %
Brady Statistic RA Percent Paced: 99.35 %
Brady Statistic RV Percent Paced: 1.22 %
Date Time Interrogation Session: 20180710134438
Implantable Lead Implant Date: 20161110
Implantable Lead Implant Date: 20161110
Implantable Lead Location: 753859
Implantable Lead Location: 753860
Implantable Lead Model: 5076
Implantable Lead Model: 5076
Implantable Pulse Generator Implant Date: 20161110
Lead Channel Impedance Value: 399 Ohm
Lead Channel Impedance Value: 437 Ohm
Lead Channel Impedance Value: 456 Ohm
Lead Channel Impedance Value: 513 Ohm
Lead Channel Pacing Threshold Amplitude: 0.625 V
Lead Channel Pacing Threshold Amplitude: 0.875 V
Lead Channel Pacing Threshold Pulse Width: 0.4 ms
Lead Channel Pacing Threshold Pulse Width: 0.4 ms
Lead Channel Sensing Intrinsic Amplitude: 3.375 mV
Lead Channel Sensing Intrinsic Amplitude: 3.375 mV
Lead Channel Sensing Intrinsic Amplitude: 4.375 mV
Lead Channel Sensing Intrinsic Amplitude: 4.375 mV
Lead Channel Setting Pacing Amplitude: 1.5 V
Lead Channel Setting Pacing Amplitude: 2 V
Lead Channel Setting Pacing Pulse Width: 0.4 ms
Lead Channel Setting Sensing Sensitivity: 2.8 mV

## 2017-05-30 ENCOUNTER — Encounter: Payer: Self-pay | Admitting: Internal Medicine

## 2017-06-18 ENCOUNTER — Ambulatory Visit (INDEPENDENT_AMBULATORY_CARE_PROVIDER_SITE_OTHER): Payer: Medicare Other | Admitting: *Deleted

## 2017-06-18 DIAGNOSIS — I495 Sick sinus syndrome: Secondary | ICD-10-CM | POA: Diagnosis not present

## 2017-06-18 NOTE — Progress Notes (Signed)
Remote pacemaker transmission.   

## 2017-06-19 LAB — CUP PACEART REMOTE DEVICE CHECK
Battery Remaining Longevity: 97 mo
Battery Voltage: 3.01 V
Brady Statistic AP VP Percent: 0.6 %
Brady Statistic AP VS Percent: 96.64 %
Brady Statistic AS VP Percent: 0 %
Brady Statistic AS VS Percent: 2.76 %
Brady Statistic RA Percent Paced: 96.87 %
Brady Statistic RV Percent Paced: 0.6 %
Date Time Interrogation Session: 20181009122724
Implantable Lead Implant Date: 20161110
Implantable Lead Implant Date: 20161110
Implantable Lead Location: 753859
Implantable Lead Location: 753860
Implantable Lead Model: 5076
Implantable Lead Model: 5076
Implantable Pulse Generator Implant Date: 20161110
Lead Channel Impedance Value: 380 Ohm
Lead Channel Impedance Value: 437 Ohm
Lead Channel Impedance Value: 513 Ohm
Lead Channel Impedance Value: 570 Ohm
Lead Channel Pacing Threshold Amplitude: 0.625 V
Lead Channel Pacing Threshold Amplitude: 0.875 V
Lead Channel Pacing Threshold Pulse Width: 0.4 ms
Lead Channel Pacing Threshold Pulse Width: 0.4 ms
Lead Channel Sensing Intrinsic Amplitude: 3.375 mV
Lead Channel Sensing Intrinsic Amplitude: 3.375 mV
Lead Channel Sensing Intrinsic Amplitude: 7.5 mV
Lead Channel Sensing Intrinsic Amplitude: 7.5 mV
Lead Channel Setting Pacing Amplitude: 1.5 V
Lead Channel Setting Pacing Amplitude: 2 V
Lead Channel Setting Pacing Pulse Width: 0.4 ms
Lead Channel Setting Sensing Sensitivity: 2.8 mV

## 2017-06-21 ENCOUNTER — Encounter: Payer: Self-pay | Admitting: Cardiology

## 2017-07-04 ENCOUNTER — Telehealth: Payer: Self-pay | Admitting: Internal Medicine

## 2017-07-04 NOTE — Telephone Encounter (Signed)
New message    Pt is calling to speak with nurse. She asked for a call back tomorrow.  Pt c/o Shortness Of Breath: STAT if SOB developed within the last 24 hours or pt is noticeably SOB on the phone  1. Are you currently SOB (can you hear that pt is SOB on the phone)? no  2. How long have you been experiencing SOB? A while  3. Are you SOB when sitting or when up moving around? Up moving around  4. Are you currently experiencing any other symptoms? Swelling  Pt c/o swelling: STAT is pt has developed SOB within 24 hours  1) How much weight have you gained and in what time span? 5 lbs since last time she was here.  2) If swelling, where is the swelling located? feet  3) Are you currently taking a fluid pill? lasix  4) Are you currently SOB? yes  5) Do you have a log of your daily weights (if so, list)? no  6) Have you gained 3 pounds in a day or 5 pounds in a week? Pt does not know.   7) Have you traveled recently? No

## 2017-07-05 NOTE — Telephone Encounter (Signed)
Called back to speak with pt about her concerns.  She reports having "alot of fluid or 3 to 4 weeks, cough for 3 to 4 months and some SOB doing nothing." (like bending over)  When asked about her fluid she reports having swelling at her feet and ankles.  She does not know how much her weight is up because she does not have scales at home.  Her most recent wt at her PCP office was 208 lbs.  The last wt we have documented was 203 lbs 4/18 (6 months ago).  In regards to her cough, she reports it has been present for 3 to 4 months.  PCP ordered her to have a chest CT which was "normal" per her report.  She says her PCP told her to take Furosemide prn (no documentation in the chart that she was taking this medication).  She thinks it is a 20 mg tablet and has been taking it for 2 weeks or more.  She does feel as though she has increased urine output when she takes it.  She has not been taking any K+ supplementation.  She does not feel as though it has help with her SOB but does admit to being over weight with a lot of her wt being around her middle.  She reports eating out a lot because she lives alone and doesn't have to cook for anyone else. Advised to f/u with PCP for further evaluation since he had given recent orders RE: furosemide.  Advised she may need possible lab work since the last lab we have from 1 year ago demonstrated elevated kidney function.  She may also need her K+ level checked.  Advised to keep feet and legs elevated above the level of her heart as much as possible, daily wts, knee high compression stockings to be worn daily and to limit NA+ intake as much as possible.  She states understanding, will f/u with PCP and c/b if further questions or concerns.  She was grateful for the call.

## 2017-07-16 ENCOUNTER — Ambulatory Visit: Payer: Medicare Other | Admitting: Neurology

## 2017-08-19 ENCOUNTER — Ambulatory Visit: Payer: Medicare Other | Admitting: Neurology

## 2017-09-09 ENCOUNTER — Encounter: Payer: Self-pay | Admitting: *Deleted

## 2017-09-17 ENCOUNTER — Ambulatory Visit (INDEPENDENT_AMBULATORY_CARE_PROVIDER_SITE_OTHER): Payer: Medicare Other | Admitting: *Deleted

## 2017-09-17 DIAGNOSIS — I495 Sick sinus syndrome: Secondary | ICD-10-CM | POA: Diagnosis not present

## 2017-09-17 NOTE — Progress Notes (Signed)
Remote pacemaker transmission.   

## 2017-09-18 ENCOUNTER — Encounter: Payer: Self-pay | Admitting: Cardiology

## 2017-09-18 LAB — CUP PACEART REMOTE DEVICE CHECK
Battery Remaining Longevity: 94 mo
Battery Voltage: 3.01 V
Brady Statistic AP VP Percent: 0.25 %
Brady Statistic AP VS Percent: 98.91 %
Brady Statistic AS VP Percent: 0 %
Brady Statistic AS VS Percent: 0.84 %
Brady Statistic RA Percent Paced: 99.09 %
Brady Statistic RV Percent Paced: 0.26 %
Date Time Interrogation Session: 20190108122656
Implantable Lead Implant Date: 20161110
Implantable Lead Implant Date: 20161110
Implantable Lead Location: 753859
Implantable Lead Location: 753860
Implantable Lead Model: 5076
Implantable Lead Model: 5076
Implantable Pulse Generator Implant Date: 20161110
Lead Channel Impedance Value: 399 Ohm
Lead Channel Impedance Value: 418 Ohm
Lead Channel Impedance Value: 494 Ohm
Lead Channel Impedance Value: 551 Ohm
Lead Channel Pacing Threshold Amplitude: 0.625 V
Lead Channel Pacing Threshold Amplitude: 0.875 V
Lead Channel Pacing Threshold Pulse Width: 0.4 ms
Lead Channel Pacing Threshold Pulse Width: 0.4 ms
Lead Channel Sensing Intrinsic Amplitude: 3.75 mV
Lead Channel Sensing Intrinsic Amplitude: 3.75 mV
Lead Channel Sensing Intrinsic Amplitude: 7.5 mV
Lead Channel Sensing Intrinsic Amplitude: 7.5 mV
Lead Channel Setting Pacing Amplitude: 1.5 V
Lead Channel Setting Pacing Amplitude: 2 V
Lead Channel Setting Pacing Pulse Width: 0.4 ms
Lead Channel Setting Sensing Sensitivity: 2.8 mV

## 2017-10-07 ENCOUNTER — Ambulatory Visit (INDEPENDENT_AMBULATORY_CARE_PROVIDER_SITE_OTHER): Payer: Medicare Other | Admitting: Internal Medicine

## 2017-10-07 ENCOUNTER — Encounter: Payer: Self-pay | Admitting: Internal Medicine

## 2017-10-07 DIAGNOSIS — I495 Sick sinus syndrome: Secondary | ICD-10-CM

## 2017-10-07 DIAGNOSIS — R001 Bradycardia, unspecified: Secondary | ICD-10-CM | POA: Diagnosis not present

## 2017-10-07 MED ORDER — FUROSEMIDE 40 MG PO TABS: 40.0000 mg | ORAL_TABLET | Freq: Every day | ORAL | 3 refills | Status: DC

## 2017-10-07 NOTE — Progress Notes (Signed)
Patient Care Team: Pomposini, [name], MD as PCP - General (Internal Medicine)   HPI  [name] is a 77 y.o. female Seen in followup for pacemaker implanted 2016 for symptomatic sinus node dysfunction   She has hypertension and significant and worsening peripheral edema.  Is accompanied by bendopnea.  She has no nocturnal dyspnea.  She does have 2 pillow orthopnea.  Her fluid intake is improved and her sodium intake is unrestricted  She does not follow her blood pressure at home.  Most recent doctor's visits it has been elevated.  She has diabetes and her most recent hemoglobin A1c was greater than 7  No interval syncope Date Cr K  5/17 1.16 4.5            Past Medical History:  Diagnosis Date  . Anxiety   . Arthritis   . Cancer ([place])    colon  . Chronic kidney disease    stage 3  . Chronic pain syndrome   . Diabetes ([place])   . Diabetic neuropathy ([place])   . Dyslipidemia   . Early cataracts, bilateral   . Fibromyalgia   . GERD (gastroesophageal reflux disease)   . H/O syncope   . Heart murmur   . Hypertension   . Lumbar spinal stenosis    and scoliosis  . Pneumonia   . PONV (postoperative nausea and vomiting)   . Presence of permanent cardiac pacemaker   . Restless legs   . Spinal headache   . Thyroid nodule     Past Surgical History:  Procedure Laterality Date  . APPENDECTOMY    . BREAST SURGERY     lumpectomy  . COLON SURGERY    . DILATION AND CURETTAGE OF UTERUS    . EP IMPLANTABLE DEVICE N/A 07/21/2015   Procedure: Pacemaker Implant;  Surgeon: Deboraha Sprang, MD;  Location: [facility];  Service: Cardiovascular;  Laterality: N/A;  . LUMBAR LAMINECTOMY/DECOMPRESSION MICRODISCECTOMY N/A 02/02/2016   Procedure: DECOMPRESSION L4-L5 WITH INSITE 2 FUSION ;  Surgeon: Melina Schools, MD;  Location: [facility];  Service: Orthopedics;  Laterality: N/A;  . PARTIAL NEPHRECTOMY    . TONSILLECTOMY    . TUBAL LIGATION      Current Outpatient  Medications  Medication Sig Dispense Refill  . acetaminophen (TYLENOL) 500 MG tablet Take 1,000 mg by mouth 3 (three) times daily.    . Cholecalciferol (VITAMIN D) 2000 UNITS tablet Take 2,000 Units by mouth daily.    . DULoxetine (CYMBALTA) 20 MG capsule Take 20 mg by mouth daily.    . fenofibrate 160 MG tablet Take 160 mg by mouth daily.    Marland Kitchen gabapentin (NEURONTIN) 800 MG tablet Take 800 mg by mouth 3 (three) times daily.    Marland Kitchen glimepiride (AMARYL) 2 MG tablet glimepiride 2 mg tablet  TAKE 1/2 TABLET BY MOUTH EVERY DAY    . Homeopathic Products (ZICAM ALLERGY RELIEF NA) Place 1 spray into the nose daily.    . hydrALAZINE (APRESOLINE) 25 MG tablet Take 25 mg by mouth 2 (two) times daily.  5  . irbesartan (AVAPRO) 300 MG tablet Take 300 mg by mouth daily.  3  . labetalol (NORMODYNE) 200 MG tablet TAKE 1 TABLET(200 MG) BY MOUTH TWICE DAILY 180 tablet 3  . methocarbamol (ROBAXIN) 500 MG tablet Take 1 tablet (500 mg total) by mouth 3 (three) times daily as needed for muscle spasms. 60 tablet 0  . pramipexole (MIRAPEX) 0.125 MG tablet pramipexole  0.125 mg tablet  TAKE 2 TABLETS BY MOUTH EVERY NIGHT AT BEDTIME AS NEEDED    . QUININE SULFATE PO Take 260 mg by mouth at bedtime.    . simvastatin (ZOCOR) 40 MG tablet Take 40 mg by mouth daily.    . sitaGLIPtin (JANUVIA) 100 MG tablet Take 50 mg by mouth daily.    . traMADol (ULTRAM) 50 MG tablet tramadol 50 mg tablet  Take 1 tablet 3 times a day by oral route.     No current facility-administered medications for this visit.     Allergies  Allergen Reactions  . Penicillins Other (See Comments)    Severe diarrhea Has patient had a PCN reaction causing immediate rash, facial/tongue/throat swelling, SOB or lightheadedness with hypotension: No Has patient had a PCN reaction causing severe rash involving mucus membranes or skin necrosis: No Has patient had a PCN reaction that required hospitalization No Has patient had a PCN reaction occurring within  the last 10 years: No If all of the above answers are "NO", then may proceed with Cephalosporin use.   . Codeine   . Adhesive [Tape] Rash  . Aspirin Other (See Comments)    Stomach burning  . Iodine Itching, Swelling and Rash    Can use if Benadryl and Prednisone are used first   . Ivp Dye [Iodinated Diagnostic Agents] Other (See Comments)    Can use if Benadryl and Prednisone are used first    . Sulfa Antibiotics Diarrhea      Review of Systems negative except from HPI and PMH  Physical Exam BP (!) 182/86   Pulse 77   Ht 5\' 3"  (1.6 m)   Wt 213 lb 6.4 oz (96.8 kg)   BMI 37.80 kg/m   Well developed and nourished in no acute distress HENT normal Neck supple with JVP 8 +HJR Clear Regular rate and rhythm, no murmurs or gallops Abd-soft with active BS No Clubbing cyanosis 3+edema Skin-warm and dry A & Oriented  Grossly normal sensory and motor function     ECG personally reviewed   A pacing @ 18 18/10/4  Assessment and  Plan  Sinus node dysfunction-symptomatic  Pacemaker-[employer]  Obstructive sleep apnea  Dyspnea on exertion  Hypertension   HFpEF    She is significantly volume overloaded.  We have reviewed the physiology of HFpEF.  We will increase her diuretics from 40 as needed to 80 every other day times 5 days and then begin her on 40 a day.  She will need a follow-up metabolic profile in 2 weeks time from her PCP.  We will check a metabolic profile today.  We discussed the importance of salt restriction and volume limits   No interval syncope.  We spent more than 50% of our >25 min visit in face to face counseling regarding the above   Current medicines are reviewed at length with the patient today .  The patient does not have concerns regarding medicines.

## 2017-10-07 NOTE — Patient Instructions (Addendum)
Medication Instructions:  Your physician has recommended you make the following change in your medication:   Lasix (furosimide) 2 tablets (80mg ) per day for 10 days; then begin one tablet (40mg ) per day.  Labwork: Your physician recommends that you have labs drawn today: BMET In two weeks, you will need another BMET drawn  Testing/Procedures: None ordered.  Follow-Up: Your physician wants you to follow-up in: One Year with Dr Caryl Comes. Please call our office to schedule the follow-up appointment.   Any Other Special Instructions Will Be Listed Below (If Applicable).     If you need a refill on your cardiac medications before your next appointment, please call your pharmacy.

## 2017-10-08 LAB — CUP PACEART INCLINIC DEVICE CHECK
Battery Remaining Longevity: 93 mo
Battery Voltage: 3.01 V
Brady Statistic AP VP Percent: 0.32 %
Brady Statistic AP VS Percent: 98.42 %
Brady Statistic AS VP Percent: 0 %
Brady Statistic AS VS Percent: 1.26 %
Brady Statistic RA Percent Paced: 98.61 %
Brady Statistic RV Percent Paced: 0.32 %
Date Time Interrogation Session: 20190128204043
Implantable Lead Implant Date: 20161110
Implantable Lead Implant Date: 20161110
Implantable Lead Location: 753859
Implantable Lead Location: 753860
Implantable Lead Model: 5076
Implantable Lead Model: 5076
Implantable Pulse Generator Implant Date: 20161110
Lead Channel Impedance Value: 380 Ohm
Lead Channel Impedance Value: 418 Ohm
Lead Channel Impedance Value: 494 Ohm
Lead Channel Impedance Value: 532 Ohm
Lead Channel Pacing Threshold Amplitude: 0.625 V
Lead Channel Pacing Threshold Amplitude: 0.875 V
Lead Channel Pacing Threshold Pulse Width: 0.4 ms
Lead Channel Pacing Threshold Pulse Width: 0.4 ms
Lead Channel Sensing Intrinsic Amplitude: 2.75 mV
Lead Channel Sensing Intrinsic Amplitude: 3.375 mV
Lead Channel Sensing Intrinsic Amplitude: 7.75 mV
Lead Channel Sensing Intrinsic Amplitude: 8.75 mV
Lead Channel Setting Pacing Amplitude: 1.5 V
Lead Channel Setting Pacing Amplitude: 2 V
Lead Channel Setting Pacing Pulse Width: 0.4 ms
Lead Channel Setting Sensing Sensitivity: 2.8 mV

## 2017-10-08 LAB — BASIC METABOLIC PANEL
BUN/Creatinine Ratio: 17 (ref 12–28)
BUN: 22 mg/dL (ref 8–27)
CO2: 22 mmol/L (ref 20–29)
Calcium: 9.5 mg/dL (ref 8.7–10.3)
Chloride: 99 mmol/L (ref 96–106)
Creatinine, Ser: 1.32 mg/dL — ABNORMAL HIGH (ref 0.57–1.00)
GFR calc Af Amer: 45 mL/min/{1.73_m2} — ABNORMAL LOW (ref >59)
GFR calc non Af Amer: 39 mL/min/{1.73_m2} — ABNORMAL LOW (ref >59)
Glucose: 153 mg/dL — ABNORMAL HIGH (ref 65–99)
Potassium: 4.7 mmol/L (ref 3.5–5.2)
Sodium: 139 mmol/L (ref 134–144)

## 2017-10-10 ENCOUNTER — Other Ambulatory Visit: Payer: Self-pay

## 2017-10-10 DIAGNOSIS — Z79899 Other long term (current) drug therapy: Secondary | ICD-10-CM

## 2017-10-14 ENCOUNTER — Telehealth: Payer: Self-pay | Admitting: Internal Medicine

## 2017-10-14 DIAGNOSIS — Z79899 Other long term (current) drug therapy: Secondary | ICD-10-CM

## 2017-10-14 NOTE — Telephone Encounter (Signed)
Patient calling, states that she is scheduled to have labwork on 10-25-17. She would like to know if she can have it completed it [place], [place]. Patient is confused about the instructions for labwork.

## 2017-10-14 NOTE — Telephone Encounter (Signed)
See note written on 2/4

## 2017-10-14 NOTE — Telephone Encounter (Signed)
Spoke with patient to clarify her labwork and medication. She understands her dose of Lasix should've been increased, alternating from 80mg  to 40mg  every other day x 5 days (which would have been last Friday 2/2). I also was able to set her up with her second lab draw to be taken closer to her home in [place], [place].  Pt stated she understood and had no additional questions at this time.

## 2017-10-14 NOTE — Telephone Encounter (Signed)
Patient calling, Patient will not be available until after 1pm today  Pt c/o medication issue:  1. Name of Medication: Lasix  2. How are you currently taking this medication (dosage and times per day)?   3. Are you having a reaction (difficulty breathing--STAT)? no  4. What is your medication issue? Patient needs to clarify dosage instructions for medication

## 2017-10-25 ENCOUNTER — Other Ambulatory Visit: Payer: Medicare Other

## 2017-10-29 ENCOUNTER — Telehealth: Payer: Self-pay

## 2017-10-29 NOTE — Telephone Encounter (Signed)
Spoke with pt regarding the rise in Creatinine to 1.97. Dr Caryl Comes suggested she follow up with her PCP as soon as she can. Pt verbalized understanding and had no additional questions.

## 2017-12-17 ENCOUNTER — Ambulatory Visit (INDEPENDENT_AMBULATORY_CARE_PROVIDER_SITE_OTHER): Payer: Medicare Other | Admitting: *Deleted

## 2017-12-17 ENCOUNTER — Telehealth: Payer: Self-pay | Admitting: Cardiology

## 2017-12-17 DIAGNOSIS — I495 Sick sinus syndrome: Secondary | ICD-10-CM

## 2017-12-17 NOTE — Telephone Encounter (Signed)
Spoke with pt and reminded pt of remote transmission that is due today. Pt verbalized understanding.   

## 2017-12-17 NOTE — Progress Notes (Signed)
Remote pacemaker transmission.   

## 2017-12-19 ENCOUNTER — Encounter: Payer: Self-pay | Admitting: Cardiology

## 2018-01-07 LAB — CUP PACEART REMOTE DEVICE CHECK
Battery Remaining Longevity: 92 mo
Battery Voltage: 3.01 V
Brady Statistic AP VP Percent: 0.27 %
Brady Statistic AP VS Percent: 98.71 %
Brady Statistic AS VP Percent: 0 %
Brady Statistic AS VS Percent: 1.02 %
Brady Statistic RA Percent Paced: 98.9 %
Brady Statistic RV Percent Paced: 0.27 %
Date Time Interrogation Session: 20190409181043
Implantable Lead Implant Date: 20161110
Implantable Lead Implant Date: 20161110
Implantable Lead Location: 753859
Implantable Lead Location: 753860
Implantable Lead Model: 5076
Implantable Lead Model: 5076
Implantable Pulse Generator Implant Date: 20161110
Lead Channel Impedance Value: 380 Ohm
Lead Channel Impedance Value: 418 Ohm
Lead Channel Impedance Value: 494 Ohm
Lead Channel Impedance Value: 551 Ohm
Lead Channel Pacing Threshold Amplitude: 0.5 V
Lead Channel Pacing Threshold Amplitude: 1 V
Lead Channel Pacing Threshold Pulse Width: 0.4 ms
Lead Channel Pacing Threshold Pulse Width: 0.4 ms
Lead Channel Sensing Intrinsic Amplitude: 3.25 mV
Lead Channel Sensing Intrinsic Amplitude: 3.25 mV
Lead Channel Sensing Intrinsic Amplitude: 8.625 mV
Lead Channel Sensing Intrinsic Amplitude: 8.625 mV
Lead Channel Setting Pacing Amplitude: 1.5 V
Lead Channel Setting Pacing Amplitude: 2 V
Lead Channel Setting Pacing Pulse Width: 0.4 ms
Lead Channel Setting Sensing Sensitivity: 2.8 mV

## 2018-01-17 ENCOUNTER — Other Ambulatory Visit: Payer: Self-pay | Admitting: Internal Medicine

## 2018-03-18 ENCOUNTER — Ambulatory Visit (INDEPENDENT_AMBULATORY_CARE_PROVIDER_SITE_OTHER): Payer: Medicare Other | Admitting: *Deleted

## 2018-03-18 ENCOUNTER — Telehealth: Payer: Self-pay | Admitting: Cardiology

## 2018-03-18 DIAGNOSIS — I495 Sick sinus syndrome: Secondary | ICD-10-CM | POA: Diagnosis not present

## 2018-03-18 NOTE — Telephone Encounter (Signed)
Spoke with pt who states she has "gained something like 15lbs in the last 3 months." She states she has increased SOB with peripheral edema. I advised patient to double her lasix dose for the next 2 days and contact her primary care doctor tomorrow for an office visit. Pt lives in [place] and it is more convenient for her to see her PCP. I advised pt to have her PCP check a BMP and to assess her fluid status. Per Dr Caryl Comes, her PCP can call him with additional questions.   Pt states she will call us to update [place] on any medication changes.  She verbalized understanding and had no additional questions.

## 2018-03-18 NOTE — Telephone Encounter (Signed)
Spoke with pt and reminded pt of remote transmission that is due today. Pt verbalized understanding.  She also stated that she has been short of breath. More than usual.

## 2018-03-19 NOTE — Progress Notes (Signed)
Remote pacemaker transmission.   

## 2018-03-20 ENCOUNTER — Emergency Department (HOSPITAL_COMMUNITY): Payer: Medicare Other

## 2018-03-20 ENCOUNTER — Encounter (HOSPITAL_COMMUNITY): Payer: Self-pay

## 2018-03-20 ENCOUNTER — Emergency Department (HOSPITAL_COMMUNITY)
Admission: EM | Admit: 2018-03-20 | Discharge: 2018-03-20 | Disposition: A | Discharge status: Home / Self Care | Payer: Medicare Other | Attending: Emergency Medicine | Admitting: Emergency Medicine

## 2018-03-20 ENCOUNTER — Telehealth: Payer: Self-pay | Admitting: Internal Medicine

## 2018-03-20 ENCOUNTER — Other Ambulatory Visit: Payer: Self-pay

## 2018-03-20 DIAGNOSIS — Z7984 Long term (current) use of oral hypoglycemic drugs: Secondary | ICD-10-CM | POA: Insufficient documentation

## 2018-03-20 DIAGNOSIS — I129 Hypertensive chronic kidney disease with stage 1 through stage 4 chronic kidney disease, or unspecified chronic kidney disease: Secondary | ICD-10-CM | POA: Insufficient documentation

## 2018-03-20 DIAGNOSIS — Z79899 Other long term (current) drug therapy: Secondary | ICD-10-CM | POA: Insufficient documentation

## 2018-03-20 DIAGNOSIS — Z87891 Personal history of nicotine dependence: Secondary | ICD-10-CM | POA: Diagnosis not present

## 2018-03-20 DIAGNOSIS — R600 Localized edema: Secondary | ICD-10-CM | POA: Insufficient documentation

## 2018-03-20 DIAGNOSIS — N183 Chronic kidney disease, stage 3 (moderate): Secondary | ICD-10-CM | POA: Insufficient documentation

## 2018-03-20 DIAGNOSIS — E785 Hyperlipidemia, unspecified: Secondary | ICD-10-CM | POA: Diagnosis not present

## 2018-03-20 DIAGNOSIS — M7989 Other specified soft tissue disorders: Secondary | ICD-10-CM

## 2018-03-20 DIAGNOSIS — R0602 Shortness of breath: Secondary | ICD-10-CM | POA: Diagnosis not present

## 2018-03-20 DIAGNOSIS — E1122 Type 2 diabetes mellitus with diabetic chronic kidney disease: Secondary | ICD-10-CM | POA: Diagnosis not present

## 2018-03-20 LAB — BASIC METABOLIC PANEL
Anion gap: 10 (ref 5–15)
BUN: 29 mg/dL — ABNORMAL HIGH (ref 8–23)
CO2: 30 mmol/L (ref 22–32)
Calcium: 9.3 mg/dL (ref 8.9–10.3)
Chloride: 102 mmol/L (ref 98–111)
Creatinine, Ser: 1.83 mg/dL — ABNORMAL HIGH (ref 0.44–1.00)
GFR calc Af Amer: 29 mL/min — ABNORMAL LOW (ref >60)
GFR calc non Af Amer: 25 mL/min — ABNORMAL LOW (ref >60)
Glucose, Bld: 100 mg/dL — ABNORMAL HIGH (ref 70–99)
Potassium: 4.4 mmol/L (ref 3.5–5.1)
Sodium: 142 mmol/L (ref 135–145)

## 2018-03-20 LAB — CBC
HCT: 39.4 % (ref 36.0–46.0)
Hemoglobin: 12.3 g/dL (ref 12.0–15.0)
MCH: 29.6 pg (ref 26.0–34.0)
MCHC: 31.2 g/dL (ref 30.0–36.0)
MCV: 94.9 fL (ref 78.0–100.0)
Platelets: 150 10*3/uL (ref 150–400)
RBC: 4.15 MIL/uL (ref 3.87–5.11)
RDW: 12.7 % (ref 11.5–15.5)
WBC: 6.3 10*3/uL (ref 4.0–10.5)

## 2018-03-20 LAB — HEPATIC FUNCTION PANEL
ALT: 19 U/L (ref 0–44)
AST: 18 U/L (ref 15–41)
Albumin: 3.9 g/dL (ref 3.5–5.0)
Alkaline Phosphatase: 37 U/L — ABNORMAL LOW (ref 38–126)
Bilirubin, Direct: 0.1 mg/dL (ref 0.0–0.2)
Indirect Bilirubin: 0.5 mg/dL (ref 0.3–0.9)
Total Bilirubin: 0.6 mg/dL (ref 0.3–1.2)
Total Protein: 6.4 g/dL — ABNORMAL LOW (ref 6.5–8.1)

## 2018-03-20 LAB — I-STAT TROPONIN, ED: Troponin i, poc: 0 ng/mL (ref 0.00–0.08)

## 2018-03-20 LAB — BRAIN NATRIURETIC PEPTIDE: B Natriuretic Peptide: 22.7 pg/mL (ref 0.0–100.0)

## 2018-03-20 MED ORDER — METOLAZONE 5 MG PO TABS: 5.0000 mg | ORAL_TABLET | ORAL | 0 refills | Status: DC

## 2018-03-20 MED ORDER — TORSEMIDE 20 MG PO TABS: 40.0000 mg | ORAL_TABLET | Freq: Every day | ORAL | 0 refills | Status: DC

## 2018-03-20 MED ORDER — FUROSEMIDE 10 MG/ML IJ SOLN
40.0000 mg | Freq: Once | INTRAMUSCULAR | Status: AC
  Administered 2018-03-20: 40 mg via INTRAVENOUS
  Filled 2018-03-20: qty 4

## 2018-03-20 NOTE — ED Provider Notes (Signed)
Patient placed in Quick Look pathway, seen and evaluated   Chief Complaint: Shortness of breath  HPI:   [name] sent from Doctor for worsening shortness of breath.  She went to PCP today and they said to come to ER.  Cardiologist is Dr. Caryl Comes.  Has been on double dose lasix for a few days.   ROS: No fevers, shortness of breath.  No pain  Physical Exam:   Gen: No distress  Neuro: Awake and Alert  Skin: Warm    Focused Exam: Speaking in full sentences.  No dyspnea at rest.  2-3+ pitting edema of bilateral lower extremities.    Initiation of care has begun. The patient has been counseled on the process, plan, and necessity for staying for the completion/evaluation, and the remainder of the medical screening examination    [name] 03/20/18 [place]    Charlesetta Shanks, MD 03/23/18 [phone]

## 2018-03-20 NOTE — ED Provider Notes (Signed)
[facility] EMERGENCY DEPARTMENT Provider Note  CSN: [id number] Arrival date & time: 03/20/18  1255  History   Chief Complaint Chief Complaint  Patient presents with  . Shortness of Breath   HPI [name] is a 77 y.o. female with a medical history of CKD stage 3, HTN, Type 2 DM and colon cancer who presented to the ED via her PCP for SOB and bilateral leg swelling. Patient reports bilateral leg swelling and SOB x9 months which has not responded to PO Lasix and titrations. She reports a 12-15 lb weight gain in the last 3 months. She is currently up to 80mg  PO Lasix QD without any change. SOB occurs with walking mostly and is not positional. Denies chest pain, diaphoresis, palpitations, cough. She endorses abdominal distention. Denies changes in bowel or urinary habits. Additional history obtained by medical chart. Last cardiology visit was in 09/2017.   Past Medical History:  Diagnosis Date  . Anxiety   . Arthritis   . Cancer ([place])    colon  . Chronic kidney disease    stage 3  . Chronic pain syndrome   . Diabetes ([place])   . Diabetic neuropathy ([place])   . Dyslipidemia   . Early cataracts, bilateral   . Fibromyalgia   . GERD (gastroesophageal reflux disease)   . H/O syncope   . Heart murmur   . Hypertension   . Lumbar spinal stenosis    and scoliosis  . Pneumonia   . PONV (postoperative nausea and vomiting)   . Presence of permanent cardiac pacemaker   . Restless legs   . Spinal headache   . Thyroid nodule     Patient Active Problem List   Diagnosis Date Noted  . Spinal stenosis at L4-L5 level 02/02/2016  . Cardiac device in situ   . Sinus node dysfunction ([place]) 07/21/2015  . Bradycardia 05/10/2015    Past Surgical History:  Procedure Laterality Date  . APPENDECTOMY    . BREAST SURGERY     lumpectomy  . COLON SURGERY    . DILATION AND CURETTAGE OF UTERUS    . EP IMPLANTABLE DEVICE N/A 07/21/2015   Procedure: Pacemaker Implant;  Surgeon:  Deboraha Sprang, MD;  Location: [facility];  Service: Cardiovascular;  Laterality: N/A;  . LUMBAR LAMINECTOMY/DECOMPRESSION MICRODISCECTOMY N/A 02/02/2016   Procedure: DECOMPRESSION L4-L5 WITH INSITE 2 FUSION ;  Surgeon: Melina Schools, MD;  Location: [facility];  Service: Orthopedics;  Laterality: N/A;  . PARTIAL NEPHRECTOMY    . TONSILLECTOMY    . TUBAL LIGATION       OB History   None    Home Medications    Prior to Admission medications   Medication Sig Start Date End Date Taking? Authorizing Provider  Cholecalciferol (VITAMIN D) 2000 UNITS tablet Take 2,000 Units by mouth daily.   Yes [provider]  DULoxetine (CYMBALTA) 20 MG capsule Take 20 mg by mouth daily.   Yes [provider]  furosemide (LASIX) 40 MG tablet Take 1 tablet (40 mg total) by mouth daily. 10/07/17 03/20/18 Yes Deboraha Sprang, MD  gabapentin (NEURONTIN) 800 MG tablet Take 800 mg by mouth 3 (three) times daily.   Yes [provider]  Homeopathic Products (ZICAM ALLERGY RELIEF NA) Place 1 spray into the nose daily.   Yes [provider]  hydrALAZINE (APRESOLINE) 25 MG tablet Take 25 mg by mouth 2 (two) times daily. 09/18/17  Yes [provider]  HYDROcodone-acetaminophen (NORCO/VICODIN) 5-325 MG  tablet Take 1 tablet by mouth 3 (three) times daily as needed for pain. 03/14/18  Yes [provider]  irbesartan (AVAPRO) 300 MG tablet Take 300 mg by mouth daily. 09/28/17  Yes [provider]  labetalol (NORMODYNE) 200 MG tablet TAKE 1 TABLET(200 MG) BY MOUTH TWICE DAILY 01/17/18  Yes Deboraha Sprang, MD  methocarbamol (ROBAXIN) 500 MG tablet Take 1 tablet (500 mg total) by mouth 3 (three) times daily as needed for muscle spasms. 02/02/16  Yes Melina Schools, MD  pramipexole (MIRAPEX) 0.125 MG tablet TAKE 2 TABLETS BY MOUTH EVERY NIGHT AT BEDTIME AS NEEDED   Yes [provider]  QUININE SULFATE PO Take 260 mg by mouth at bedtime.   Yes [provider]  simvastatin (ZOCOR) 40 MG tablet Take 40 mg by mouth daily.   Yes [provider]  sitaGLIPtin (JANUVIA) 100 MG tablet Take 50 mg by mouth daily.   Yes [provider]  traMADol (ULTRAM) 50 MG tablet Take 1 tablet 3 times a day by oral route.   Yes [provider]  TRESIBA FLEXTOUCH 200 UNIT/ML SOPN Inject 100 Units into the skin every morning. 02/07/18  Yes [provider]  metolazone (ZAROXOLYN) 5 MG tablet Take 1 tablet (5 mg total) by mouth 3 (three) times a week for 14 doses. 03/21/18 04/22/18  Mortis, Alvie Heidelberg I, PA-C  torsemide (DEMADEX) 20 MG tablet Take 2 tablets (40 mg total) by mouth daily for 14 days. 03/20/18 04/03/18  Mortis, Jonelle Sports, PA-C   Family History Family History  Problem Relation Age of Onset  . CAD Unknown   . Sick sinus syndrome Brother        has a PPM    Social History Social History   Tobacco Use  . Smoking status: Former Smoker    Start date: 09/10/1953    Last attempt to quit: 09/11/1983    Years since quitting: 34.5  . Smokeless tobacco: Never Used  Substance Use Topics  . Alcohol use: Yes    Alcohol/week: 0.0 oz    Comment: rare glass of wine  . Drug use: No     Allergies   Penicillins; Codeine; Adhesive [tape]; Aspirin; Iodine; Ivp dye [iodinated diagnostic agents]; and Sulfa antibiotics   Review of Systems Review of Systems  Constitutional: Negative for chills, diaphoresis, fatigue and fever.  HENT: Negative.   Eyes: Negative for visual disturbance.  Respiratory: Positive for shortness of breath. Negative for cough, choking, chest tightness and wheezing.   Cardiovascular: Negative for chest pain, palpitations and leg swelling.  Gastrointestinal: Positive for abdominal distention. Negative for abdominal pain, constipation, diarrhea, nausea and vomiting.  Genitourinary: Negative.   Musculoskeletal: Positive for joint swelling.  Skin: Negative.   Allergic/Immunologic: Negative.   Hematological:  Negative.      Physical Exam Updated Vital Signs BP (!) 117/54   Pulse 61   Temp 98.6 F (37 C) (Oral)   Resp 20   SpO2 95%   Physical Exam  Constitutional: Vital signs are normal. She is cooperative. She does not appear ill. No distress.  Obese  HENT:  Mouth/Throat: Oropharynx is clear and moist.  Eyes: Pupils are equal, round, and reactive to light. Conjunctivae, EOM and lids are normal.  Neck: Normal range of motion and full passive range of motion without pain. Neck supple. No hepatojugular reflux and no JVD present.  Cardiovascular: Normal rate, regular rhythm, normal heart sounds and intact distal pulses.  Pulses:  Dorsalis pedis pulses are 2+ on the right side, and 2+ on the left side.  Pulmonary/Chest: Effort normal and breath sounds normal. No accessory muscle usage. No tachypnea. No respiratory distress. She has no decreased breath sounds. She has no rhonchi. She has no rales.  Abdominal: She exhibits distension and fluid wave. There is no tenderness.  Lymphadenopathy:  3+ pitting edema to the knee  Neurological: She is alert.  Skin: Skin is warm and intact. Capillary refill takes less than 2 seconds. No rash noted. No erythema.     ED Treatments / Results  Labs (all labs ordered are listed, but only abnormal results are displayed) Labs Reviewed  BASIC METABOLIC PANEL - Abnormal; Notable for the following components:      Result Value   Glucose, Bld 100 (*)    BUN 29 (*)    Creatinine, Ser 1.83 (*)    GFR calc non Af Amer 25 (*)    GFR calc Af Amer 29 (*)    All other components within normal limits  CBC  BRAIN NATRIURETIC PEPTIDE  HEPATIC FUNCTION PANEL  I-STAT TROPONIN, ED   EKG EKG Interpretation  Date/Time:  Thursday March 20 2018 12:58:51 EDT Ventricular Rate:  67 PR Interval:  162 QRS Duration: 102 QT Interval:  440 QTC Calculation: 464 R Axis:   -34 Text Interpretation:  Electronic atrial pacemaker Left axis deviation Pulmonary disease  pattern Left ventricular hypertrophy with repolarization abnormality Abnormal ECG Confirmed by Dene Gentry [phone]) on 03/20/2018 4:02:14 PM   Radiology Dg Chest 2 View  Result Date: 03/20/2018 CLINICAL DATA:  Shortness of Breath EXAM: CHEST - 2 VIEW COMPARISON:  July 22, 2015 FINDINGS: There is scarring in the bases, stable. There is no edema or consolidation. Heart is upper normal in size with pulmonary vascularity normal. Pacemaker leads are attached to the right atrium and right ventricle. No adenopathy. There is degenerative change in the lower thoracic and upper lumbar regions. There is aortic atherosclerosis. IMPRESSION: Mild bibasilar scarring. No edema or consolidation. Stable cardiac silhouette. There is aortic atherosclerosis. Aortic Atherosclerosis (ICD10-I70.0). Electronically Signed   By: Lowella Grip III M.D.   On: 03/20/2018 13:42   Procedures Procedures (including critical care time)  Medications Ordered in ED Medications  furosemide (LASIX) injection 40 mg (has no administration in time range)  furosemide (LASIX) injection 40 mg (40 mg Intravenous Given 03/20/18 1513)    Initial Impression / Assessment and Plan / ED Course  Triage vital signs and the nursing notes have been reviewed.  Pertinent labs & imaging results that were available during care of the patient were reviewed and considered in medical decision making (see chart for details).  Clinical Course as of Mar 20 1612  Thu Mar 20, 2018  1354 EKG showed NSR. No ST elevations/depressions or signs of acute ischemia or infarct. This is reassuring in combination with negative troponin which assists in evaluating and ruling out an acute cardiac process.  CXR shows no acute pulmonary changes. No consolidation, vascular congestion or edema appreciated. Compared to last CXR in 2017, today's x-ray looks the same.   [GM]  1411 Besides abdominal distention and bilateral leg swelling, physical exam is unremarkable.  No pulm abnormalities on exam or on CXR. SOB likely due to external pressure due to fluid in abdomen. Unclear at this point if kidney function is cause or result of edema.   [GM]  1430 Case discussed with Dr. Dene Gentry. Patient would benefit from inpatient IV diuresis given  minimal change to swelling with outpatient management and increase in creatinine function. Will seek inpatient admission.   [GM]  1521 Case discussed with [facility] for admission. [facility] provider states that they will not admit patient based on her clinical presentation. "We admit on diagnosis not on symptoms." They stated that patient should follow-up with her cardiologist for outpatient management. This provider presented concerns for patient's elevated creatinine and the need to be monitored with diuresis. However, [facility] maintained their position and stated patient should follow-up as outpatient.   [GM]  1525 This provider called patient's cardiologist, Dr. Caryl Comes, for insight. Advised to give another dose of  IV Lasix 40mg . Also advised the following medication adjustments: Torsemide 40mg  QD and Metalozone 5mg  three times a week.  Case discussed with Delia Heady, PA-C at shift change.   [GM]    Clinical Course User Index [GM] Mortis, Jonelle Sports, PA-C   Final Clinical Impressions(s) / ED Diagnoses  1. Shortness of Breath and Edema. Admission attempted due to resistance to outpatient treatment and increase in creatinine. Refused by hospitalist service. Case discussed with pt's cardiologist and will discharge with medication adjustments recommended above.  Dispo: Home. Case staffed with Delia Heady, PA-C at shift change.  Final diagnoses:  Shortness of breath  Leg swelling    ED Discharge Orders        Ordered    metolazone (ZAROXOLYN) 5 MG tablet  3 times weekly     03/20/18 1603    torsemide (DEMADEX) 20 MG tablet  Daily     03/20/18 [address] 03/20/18 1613    Valarie Merino, MD 03/21/18 [phone]

## 2018-03-20 NOTE — ED Provider Notes (Signed)
Physical Exam  BP (!) 180/74   Pulse 63   Temp 98.6 F (37 C) (Oral)   Resp 14   SpO2 95%   Physical Exam  Constitutional: She appears well-developed and well-nourished. No distress.  HENT:  Head: Normocephalic and atraumatic.  Eyes: Conjunctivae and EOM are normal. No scleral icterus.  Neck: Normal range of motion.  Pulmonary/Chest: Effort normal. No respiratory distress.  Neurological: She is alert.  Skin: No rash noted. She is not diaphoretic.  Psychiatric: She has a normal mood and affect.  Nursing note and vitals reviewed.   ED Course/Procedures   Clinical Course as of Mar 20 1754  Thu Mar 20, 2018  1354 EKG showed NSR. No ST elevations/depressions or signs of acute ischemia or infarct. This is reassuring in combination with negative troponin which assists in evaluating and ruling out an acute cardiac process.  CXR shows no acute pulmonary changes. No consolidation, vascular congestion or edema appreciated. Compared to last CXR in 2017, today's x-ray looks the same.   [GM]  1411 Besides abdominal distention and bilateral leg swelling, physical exam is unremarkable. No pulm abnormalities on exam or on CXR. SOB likely due to external pressure due to fluid in abdomen. Unclear at this point if kidney function is cause or result of edema.   [GM]  1430 Case discussed with Dr. Dene Gentry. Patient would benefit from inpatient IV diuresis given minimal change to swelling with outpatient management and increase in creatinine function. Will seek inpatient admission.   [GM]  1521 Case discussed with [facility] for [facility]. [facility] provider states that they will not admit patient based on her clinical presentation. "We admit on diagnosis not on symptoms." They stated that patient should follow-up with her cardiologist for outpatient management. This provider presented concerns for patient's elevated creatinine and the need to be monitored with diuresis. However, [facility] maintained their position and stated patient should follow-up as outpatient.   [GM]  1525 This provider called patient's cardiologist, Dr. Caryl Comes, for insight. Advised to give another dose of  IV Lasix 40mg . Also advised the following medication adjustments: Torsemide 40mg  QD and Metalozone 5mg  three times a week.  Case discussed with Delia Heady, PA-C at shift change.   [GM]    Clinical Course User Index [GM] Mortis, Jonelle Sports, PA-C    Procedures  MDM  Care handed off from previous provider, G.Mortis, PA-C.  Please see their note for further detail.  Briefly, patient is a 77 year old female with a past medical history of stage III CKD, hypertension, type 2 diabetes, colon cancer who presents for shortness of breath and bilateral leg swelling.  Leg swelling has been going on for about 9 months and has not responded to p.o. Lasix and titrations.  Here patient does appear fluid overloaded with abdominal distention and lower extremity pitting edema.  Lab work significant for BNP within normal limits, troponin negative, BMP with no changes in BUN and creatinine based on known history of stage III CKD.  Spoke to hospitalist who declines admission.  Spoke to cardiologist who recommends giving 2 doses of 40 mg IV Lasix here, waiting for urination and discharging home with new prescription for torsemide 40 mg and metolazone 5 mg 3 times a day.  Plan is to reassess after second dose of IV Lasix given.  On reassessment, patient is resting comfortably and eating without any distress.  She states that her fever feels a lot better than when she came to the ED.  Will discharge home with new prescriptions, PCP and cardiology follow-up.  Advised to return to ED for any severe worsening symptoms.  Portions of this note were generated with [employer]. Dictation errors may occur despite best attempts at proofreading.       Delia Heady, PA-C 03/20/18 Mikel Cella,  MD 03/20/18 [phone]

## 2018-03-20 NOTE — ED Notes (Signed)
Sandwich meal given.

## 2018-03-20 NOTE — Telephone Encounter (Signed)
New message:       RN is calling from the hospital to speak with RN or Caryl Comes in regards to the pt's care while in the hospital. She states the pt has been in contact with Caryl Comes for the last couple of days.

## 2018-03-20 NOTE — Discharge Instructions (Addendum)
I spoke with Dr. Caryl Comes about you today. We will go with the following medication adjustments: Torsemide 40 mg daily for 14 days, metolazone 5 mg 3 times a week for 14 doses. Return to ED for any worsening symptoms, trouble breathing or trouble swallowing, numbness in arms or legs, severe chest pain.

## 2018-03-20 NOTE — ED Triage Notes (Signed)
Pt presents for evaluation of shortness of breath and swelling to abd and legs.

## 2018-03-20 NOTE — Telephone Encounter (Signed)
Dr Caryl Comes to speak with PA at [facility] ER for recommendation.

## 2018-03-20 NOTE — Telephone Encounter (Signed)
Pt visited her PCP today. He highly recommended she visit the ER for SOB, fluid retention, excessive edema, and +12lb weight gain since her last OV with him a few weeks ago. Pt calling to seek advice. I advised her that her PCP knows her well. If he is concerned I would heed his advice and go to the ED. Pt stated she has a friend who is bringing her to [facility] today. I told her I would relay the message to Dr Caryl Comes.  She had no additional questions and verbalized understanding.

## 2018-03-20 NOTE — Telephone Encounter (Signed)
Follow Up: ° ° ° °Pt returning your call from yesterday. °

## 2018-03-24 ENCOUNTER — Telehealth: Payer: Self-pay | Admitting: Internal Medicine

## 2018-03-24 DIAGNOSIS — E876 Hypokalemia: Secondary | ICD-10-CM

## 2018-03-24 NOTE — Telephone Encounter (Signed)
Ne Message    Spoke with patient this morning regarding scheduling hospital follow up, she stated that Dr Caryl Comes changed her fluid medication and she wants to wait to see if it helps before she schedules a follow up appointment.  Thanked the patient for her time, told her if she needs anything to just give [place] a call and we will gladly get her an appointment.

## 2018-03-27 ENCOUNTER — Other Ambulatory Visit: Payer: Self-pay | Admitting: Internal Medicine

## 2018-03-27 ENCOUNTER — Telehealth: Payer: Self-pay | Admitting: Internal Medicine

## 2018-03-27 MED ORDER — POTASSIUM CHLORIDE ER 10 MEQ PO TBCR: 20.0000 meq | EXTENDED_RELEASE_TABLET | Freq: Two times a day (BID) | ORAL | 3 refills | Status: DC

## 2018-03-27 NOTE — Telephone Encounter (Signed)
Per Dr Caryl Comes, he would like pt to begin 23mEq K+ bid until results of pt's BMP are resulted. Pt is also to stop her Metolazone at this time until further notice. Pt verbalized and agreed to plan.

## 2018-03-27 NOTE — Telephone Encounter (Signed)
Called pt today to f/up from her ED visit. We reviewed medications. She is taking torsemide 40mg  qd and metolazone, 5mg  3 days per week-- up to 14 doses total. Pt states she has not taken her lasix. She states she is feeling better and "a lot of fluid is coming off." She has c/o "terrible leg cramps." She states she takes and OTC K supplement of "99mg ." I was unsure of her actual dose, so she agrees to have a BMP drawn. Dr Caryl Comes is to give recommendation of K supplement after labs results. Pt agrees to plan and will await our call.

## 2018-03-27 NOTE — Telephone Encounter (Signed)
Fax went through after 3 scans.

## 2018-03-27 NOTE — Addendum Note (Signed)
Addended by: Dollene Primrose on: 03/27/2018 02:34 PM   Modules accepted: Orders

## 2018-03-27 NOTE — Telephone Encounter (Signed)
New Message      Lorrie from [facility] in [place] is calling to let us know that the labs that were faxed have not come over yet. She would like for you to refax them again please.

## 2018-03-28 ENCOUNTER — Other Ambulatory Visit: Payer: Self-pay

## 2018-03-28 DIAGNOSIS — Z79899 Other long term (current) drug therapy: Secondary | ICD-10-CM

## 2018-03-28 DIAGNOSIS — E876 Hypokalemia: Secondary | ICD-10-CM

## 2018-03-28 LAB — BASIC METABOLIC PANEL
BUN/Creatinine Ratio: 26 (ref 12–28)
BUN: 79 mg/dL (ref 8–27)
CO2: 30 mmol/L — ABNORMAL HIGH (ref 20–29)
Calcium: 9.5 mg/dL (ref 8.7–10.3)
Chloride: 87 mmol/L — ABNORMAL LOW (ref 96–106)
Creatinine, Ser: 3.07 mg/dL — ABNORMAL HIGH (ref 0.57–1.00)
GFR calc Af Amer: 16 mL/min/{1.73_m2} — ABNORMAL LOW (ref >59)
GFR calc non Af Amer: 14 mL/min/{1.73_m2} — ABNORMAL LOW (ref >59)
Glucose: 278 mg/dL — ABNORMAL HIGH (ref 65–99)
Potassium: 5 mmol/L (ref 3.5–5.2)
Sodium: 137 mmol/L (ref 134–144)

## 2018-03-28 NOTE — Addendum Note (Signed)
Addended by: Dollene Primrose on: 03/28/2018 04:40 PM   Modules accepted: Orders

## 2018-03-28 NOTE — Telephone Encounter (Signed)
Pts K came back at 5.0; Creat 3.07. Per Dr Olin Pia recommendation, pt is to stop her Torsemide and potassium supplement. She needs to repeat her BMP on Monday 7/22. LVM with detailed instructions. Will follow up on Tues 7/23 when I return to office.

## 2018-04-01 NOTE — Telephone Encounter (Signed)
Pt received messages last week. She continued to take her torsemide bc she states she is so swollen and uncomfortable. Pt will have repeat BMP drawn today for further review. Will f/up with Dr Caryl Comes for further recommendation.

## 2018-04-02 LAB — CUP PACEART REMOTE DEVICE CHECK
Battery Remaining Longevity: 90 mo
Battery Voltage: 3.01 V
Brady Statistic AP VP Percent: 0.19 %
Brady Statistic AP VS Percent: 98.98 %
Brady Statistic AS VP Percent: 0 %
Brady Statistic AS VS Percent: 0.83 %
Brady Statistic RA Percent Paced: 99.16 %
Brady Statistic RV Percent Paced: 0.19 %
Date Time Interrogation Session: 20190709185700
Implantable Lead Implant Date: 20161110
Implantable Lead Implant Date: 20161110
Implantable Lead Location: 753859
Implantable Lead Location: 753860
Implantable Lead Model: 5076
Implantable Lead Model: 5076
Implantable Pulse Generator Implant Date: 20161110
Lead Channel Impedance Value: 380 Ohm
Lead Channel Impedance Value: 399 Ohm
Lead Channel Impedance Value: 456 Ohm
Lead Channel Impedance Value: 513 Ohm
Lead Channel Pacing Threshold Amplitude: 0.625 V
Lead Channel Pacing Threshold Amplitude: 1 V
Lead Channel Pacing Threshold Pulse Width: 0.4 ms
Lead Channel Pacing Threshold Pulse Width: 0.4 ms
Lead Channel Sensing Intrinsic Amplitude: 3.25 mV
Lead Channel Sensing Intrinsic Amplitude: 3.25 mV
Lead Channel Sensing Intrinsic Amplitude: 8.125 mV
Lead Channel Sensing Intrinsic Amplitude: 8.125 mV
Lead Channel Setting Pacing Amplitude: 1.5 V
Lead Channel Setting Pacing Amplitude: 2 V
Lead Channel Setting Pacing Pulse Width: 0.4 ms
Lead Channel Setting Sensing Sensitivity: 2.8 mV

## 2018-04-02 LAB — BASIC METABOLIC PANEL
BUN/Creatinine Ratio: 23 (ref 12–28)
BUN: 57 mg/dL — ABNORMAL HIGH (ref 8–27)
CO2: 25 mmol/L (ref 20–29)
Calcium: 9.4 mg/dL (ref 8.7–10.3)
Chloride: 95 mmol/L — ABNORMAL LOW (ref 96–106)
Creatinine, Ser: 2.45 mg/dL — ABNORMAL HIGH (ref 0.57–1.00)
GFR calc Af Amer: 21 mL/min/{1.73_m2} — ABNORMAL LOW (ref >59)
GFR calc non Af Amer: 18 mL/min/{1.73_m2} — ABNORMAL LOW (ref >59)
Glucose: 351 mg/dL — ABNORMAL HIGH (ref 65–99)
Potassium: 4.3 mmol/L (ref 3.5–5.2)
Sodium: 141 mmol/L (ref 134–144)

## 2018-04-08 ENCOUNTER — Telehealth: Payer: Self-pay | Admitting: Internal Medicine

## 2018-04-08 NOTE — Telephone Encounter (Signed)
-----   Message from Dollene Primrose, RN sent at 04/08/2018  9:50 AM EDT ----- Regarding: [facility] referral needed asap Good Morning!  Dr Caryl Comes has a patient who needs to be seen by [facility] asap if available.   Two weeks ago, she had c/o 12lb weight gain, SOB, etc. Her PCP sent her to the ED. Unfortunately, the hosptialist refused to admit her. At that time, the ED PA called Dr Caryl Comes for recommendation. He suggested metolazone qod for a few days. Unfortunately she was sent home on metolazone qod x 14 doses. Pt had a significant increase in creatinine. We stopped her metolazone (after 4 doses) and torsemide at that time. She did have weight loss after her diuresis. I am unsure as to how much.  Upon recheck, her creatinine recovered some, but not fully. Dr Caryl Comes would like her to be seen by [facility] if possible. She calls me today with the same c/o 15lb weight gain, SOB, fatigue. Dr Caryl Comes is out of the office for the next two weeks. I'm not sure if you guys could help her out and get her seen this week.   Thank you so much!  Lorren

## 2018-04-08 NOTE — Telephone Encounter (Signed)
New Message:       Pt c/o swelling: STAT is pt has developed SOB within 24 hours  1) How much weight have you gained and in what time span? Pt states she feels like she has gained 15 lbs but has not weighed.  2) If swelling, where is the swelling located? Legs/feet  3) Are you currently taking a fluid pill? No  4) Are you currently SOB? yes  5) Do you have a log of your daily weights (if so, list)? No  6) Have you gained 3 pounds in a day or 5 pounds in a week?   7) Have you traveled recently? No    Pt c/o medication issue:  1. Name of Medication: lasix  2. How are you currently taking this medication (dosage and times per day)?   3. Are you having a reaction (difficulty breathing--STAT)? No  4. What is your medication issue? Pt states she was told to stop taking the lasix and now she is having a lot of swelling.

## 2018-04-08 NOTE — Telephone Encounter (Signed)
Per Dr.McLean patient can be seen in the APP clinic but will need labs done before appt unless she feels bad then she would need to go to the emergency room. Spoke with patient she doesn't fell bad mostly concerned with swelling.  Patient lives in [place], [place] and is caring for a sick neighbor and will not be able to have labs drawn today. Pt would still like appt here tomorrow. Per Dr.McLean creatinine was trending down on labs 7/23 so pt can just be seen tomorrow. Patient added to APP schedule.

## 2018-04-09 ENCOUNTER — Other Ambulatory Visit (HOSPITAL_COMMUNITY): Payer: Self-pay | Admitting: Adult Health

## 2018-04-09 ENCOUNTER — Encounter (HOSPITAL_COMMUNITY): Payer: Self-pay

## 2018-04-09 ENCOUNTER — Ambulatory Visit (HOSPITAL_COMMUNITY)
Admission: RE | Admit: 2018-04-09 | Discharge: 2018-04-09 | Disposition: A | Discharge status: Home / Self Care | Payer: Medicare Other | Source: Ambulatory Visit | Attending: Cardiology | Admitting: Cardiology

## 2018-04-09 VITALS — BP 166/82 | HR 67 | Wt 223.8 lb

## 2018-04-09 DIAGNOSIS — Z85038 Personal history of other malignant neoplasm of large intestine: Secondary | ICD-10-CM | POA: Diagnosis not present

## 2018-04-09 DIAGNOSIS — K219 Gastro-esophageal reflux disease without esophagitis: Secondary | ICD-10-CM | POA: Diagnosis not present

## 2018-04-09 DIAGNOSIS — E1122 Type 2 diabetes mellitus with diabetic chronic kidney disease: Secondary | ICD-10-CM | POA: Insufficient documentation

## 2018-04-09 DIAGNOSIS — I5030 Unspecified diastolic (congestive) heart failure: Secondary | ICD-10-CM | POA: Diagnosis present

## 2018-04-09 DIAGNOSIS — Z6839 Body mass index (BMI) 39.0-39.9, adult: Secondary | ICD-10-CM | POA: Diagnosis not present

## 2018-04-09 DIAGNOSIS — E1140 Type 2 diabetes mellitus with diabetic neuropathy, unspecified: Secondary | ICD-10-CM | POA: Insufficient documentation

## 2018-04-09 DIAGNOSIS — N183 Chronic kidney disease, stage 3 (moderate): Secondary | ICD-10-CM | POA: Insufficient documentation

## 2018-04-09 DIAGNOSIS — M797 Fibromyalgia: Secondary | ICD-10-CM | POA: Insufficient documentation

## 2018-04-09 DIAGNOSIS — E785 Hyperlipidemia, unspecified: Secondary | ICD-10-CM | POA: Insufficient documentation

## 2018-04-09 DIAGNOSIS — R7989 Other specified abnormal findings of blood chemistry: Secondary | ICD-10-CM | POA: Diagnosis not present

## 2018-04-09 DIAGNOSIS — I5032 Chronic diastolic (congestive) heart failure: Secondary | ICD-10-CM | POA: Diagnosis not present

## 2018-04-09 DIAGNOSIS — G894 Chronic pain syndrome: Secondary | ICD-10-CM | POA: Insufficient documentation

## 2018-04-09 DIAGNOSIS — E669 Obesity, unspecified: Secondary | ICD-10-CM | POA: Insufficient documentation

## 2018-04-09 DIAGNOSIS — Z885 Allergy status to narcotic agent status: Secondary | ICD-10-CM | POA: Diagnosis not present

## 2018-04-09 DIAGNOSIS — Z87891 Personal history of nicotine dependence: Secondary | ICD-10-CM | POA: Insufficient documentation

## 2018-04-09 DIAGNOSIS — Z880 Allergy status to penicillin: Secondary | ICD-10-CM | POA: Insufficient documentation

## 2018-04-09 DIAGNOSIS — Z794 Long term (current) use of insulin: Secondary | ICD-10-CM | POA: Insufficient documentation

## 2018-04-09 DIAGNOSIS — I495 Sick sinus syndrome: Secondary | ICD-10-CM | POA: Insufficient documentation

## 2018-04-09 DIAGNOSIS — Z8249 Family history of ischemic heart disease and other diseases of the circulatory system: Secondary | ICD-10-CM | POA: Diagnosis not present

## 2018-04-09 DIAGNOSIS — Z886 Allergy status to analgesic agent status: Secondary | ICD-10-CM | POA: Diagnosis not present

## 2018-04-09 DIAGNOSIS — Z882 Allergy status to sulfonamides status: Secondary | ICD-10-CM | POA: Insufficient documentation

## 2018-04-09 DIAGNOSIS — N179 Acute kidney failure, unspecified: Secondary | ICD-10-CM | POA: Diagnosis not present

## 2018-04-09 DIAGNOSIS — I130 Hypertensive heart and chronic kidney disease with heart failure and stage 1 through stage 4 chronic kidney disease, or unspecified chronic kidney disease: Secondary | ICD-10-CM | POA: Diagnosis not present

## 2018-04-09 DIAGNOSIS — Z79899 Other long term (current) drug therapy: Secondary | ICD-10-CM | POA: Insufficient documentation

## 2018-04-09 DIAGNOSIS — R29818 Other symptoms and signs involving the nervous system: Secondary | ICD-10-CM | POA: Diagnosis not present

## 2018-04-09 DIAGNOSIS — G2581 Restless legs syndrome: Secondary | ICD-10-CM | POA: Insufficient documentation

## 2018-04-09 DIAGNOSIS — F419 Anxiety disorder, unspecified: Secondary | ICD-10-CM | POA: Insufficient documentation

## 2018-04-09 DIAGNOSIS — I5022 Chronic systolic (congestive) heart failure: Secondary | ICD-10-CM | POA: Diagnosis not present

## 2018-04-09 DIAGNOSIS — Z950 Presence of cardiac pacemaker: Secondary | ICD-10-CM | POA: Diagnosis not present

## 2018-04-09 DIAGNOSIS — Z91041 Radiographic dye allergy status: Secondary | ICD-10-CM | POA: Insufficient documentation

## 2018-04-09 DIAGNOSIS — Z888 Allergy status to other drugs, medicaments and biological substances status: Secondary | ICD-10-CM | POA: Insufficient documentation

## 2018-04-09 LAB — BASIC METABOLIC PANEL
Anion gap: 7 (ref 5–15)
BUN: 26 mg/dL — ABNORMAL HIGH (ref 8–23)
CO2: 27 mmol/L (ref 22–32)
Calcium: 9.5 mg/dL (ref 8.9–10.3)
Chloride: 105 mmol/L (ref 98–111)
Creatinine, Ser: 1.57 mg/dL — ABNORMAL HIGH (ref 0.44–1.00)
GFR calc Af Amer: 35 mL/min — ABNORMAL LOW (ref >60)
GFR calc non Af Amer: 30 mL/min — ABNORMAL LOW (ref >60)
Glucose, Bld: 198 mg/dL — ABNORMAL HIGH (ref 70–99)
Potassium: 4.9 mmol/L (ref 3.5–5.1)
Sodium: 139 mmol/L (ref 135–145)

## 2018-04-09 MED ORDER — TORSEMIDE 20 MG PO TABS: 40.0000 mg | ORAL_TABLET | Freq: Every day | ORAL | 11 refills | Status: DC

## 2018-04-09 NOTE — Patient Instructions (Signed)
Take Torsemide 40 mg (2 tabs) once every morning.  Follow up 2-3 weeks with echocardiogram.  ______________________________________________________________________ [name] Code: 1500  Take all medication as prescribed the day of your appointment. Bring all medications with you to your appointment.  Do the following things EVERYDAY: 1) Weigh yourself in the morning before breakfast. Write it down and keep it in a log. 2) Take your medicines as prescribed 3) Eat low salt foods-Limit salt (sodium) to 2000 mg per day.  4) Stay as active as you can everyday 5) Limit all fluids for the day to less than 2 liters

## 2018-04-09 NOTE — Progress Notes (Signed)
ADVANCED HF CLINIC CONSULT NOTE  Referring Physician: Primary Care: [place] Primary Cardiologist: EP: Dr Caryl Comes  HPI: She is being seen today at the request of Dr Caryl Comes for HF consultation.   [name] is a 77 year old with history of diastolic heart failure, DM, GERD, colon cancer, CKD stage III, 2016 pacemaker symptomatic sinus node dysfunction.    She has been struggling with weight gain over the last few weeks.  2 weeks ago she had 12 pound weight and she was sent to the ED by her PCP. She was sent home on metolazone every other day for 14 days. She took 4 doses and her creatinine went up to 3 so metolazone was stopped.   Today she presents to the HF clinic as a new patient. SOB with exertion. Denies PND. + Orthopnea . Sleeps in a recliner. Weight at home has been trending up from 203 to 220 pounds. She has not had any diuretics in 7 days. She has not been tested for sleep apnea and says she does not want pursue test. Tries to limit salt intake. Taking all medications. Lives alone.   06/21/2015 Myoview   Nuclear stress EF: 64%.  There was no ST segment deviation noted during stress.  The study is normal.  This is a low risk study. No ischemia identified.    Review of Systems: [y] = yes, [ ]  = no   General: Weight gain [Y ]; Weight loss [ ] ; Anorexia [ ] ; Fatigue [Y ]; Fever [ ] ; Chills [ ] ; Weakness [Y ]  Cardiac: Chest pain/pressure [ ] ; Resting SOB [ ] ; Exertional SOB [Y ]; Orthopnea [ ] ; Pedal Edema [Y ]; Palpitations [ ] ; Syncope [ ] ; Presyncope [ ] ; Paroxysmal nocturnal dyspnea[ ]   Pulmonary: Cough [ ] ; Wheezing[ ] ; Hemoptysis[ ] ; Sputum [ ] ; Snoring [ ]   GI: Vomiting[ ] ; Dysphagia[ ] ; Melena[ ] ; Hematochezia [ ] ; Heartburn[ ] ; Abdominal pain [ ] ; Constipation [ ] ; Diarrhea [ ] ; BRBPR [ ]   GU: Hematuria[ ] ; Dysuria [ ] ; Nocturia[ ]   Vascular: Pain in legs with walking [ ] ; Pain in feet with lying flat [ ] ; Non-healing sores [ ] ; Stroke [ ] ; TIA [ ] ; Slurred speech [ ] ;   Neuro: Headaches[ ] ; Vertigo[ ] ; Seizures[ ] ; Paresthesias[ ] ;Blurred vision [ ] ; Diplopia [ ] ; Vision changes [ ]   Ortho/Skin: Arthritis [ ] ; Joint pain [Y ]; Muscle pain [ ] ; Joint swelling [ ] ; Back Pain [ Y]; Rash [ ]   Psych: Depression[ ] ; Anxiety[ ]   Heme: Bleeding problems [ ] ; Clotting disorders [ ] ; Anemia [ ]   Endocrine: Diabetes [ ] ; Thyroid dysfunction[ ]    Past Medical History:  Diagnosis Date  . Anxiety   . Arthritis   . Cancer ([place])    colon  . Chronic kidney disease    stage 3  . Chronic pain syndrome   . Diabetes ([place])   . Diabetic neuropathy ([place])   . Dyslipidemia   . Early cataracts, bilateral   . Fibromyalgia   . GERD (gastroesophageal reflux disease)   . H/O syncope   . Heart murmur   . Hypertension   . Lumbar spinal stenosis    and scoliosis  . Pneumonia   . PONV (postoperative nausea and vomiting)   . Presence of permanent cardiac pacemaker   . Restless legs   . Spinal headache   . Thyroid nodule     Current Outpatient Medications  Medication Sig Dispense Refill  . acetaminophen (  TYLENOL) 650 MG CR tablet Take 650 mg by mouth every 8 (eight) hours as needed for pain.    . Cholecalciferol (VITAMIN D) 2000 UNITS tablet Take 2,000 Units by mouth daily.    . cyclobenzaprine (FLEXERIL) 10 MG tablet Take 10 mg by mouth 3 (three) times daily as needed for muscle spasms.    . diphenhydrAMINE (BENADRYL) 50 MG capsule Take 50 mg by mouth at bedtime as needed.    . DULoxetine (CYMBALTA) 20 MG capsule Take 20 mg by mouth daily.    . fenofibrate 160 MG tablet Take 160 mg by mouth daily.    Marland Kitchen gabapentin (NEURONTIN) 800 MG tablet Take 800 mg by mouth 3 (three) times daily.    . hydrALAZINE (APRESOLINE) 25 MG tablet Take 25 mg by mouth 2 (two) times daily.  5  . irbesartan (AVAPRO) 300 MG tablet Take 300 mg by mouth daily.  3  . labetalol (NORMODYNE) 200 MG tablet TAKE 1 TABLET(200 MG) BY MOUTH TWICE DAILY 180 tablet 2  . omeprazole (PRILOSEC) 20 MG capsule  Take 20 mg by mouth daily.    . pramipexole (MIRAPEX) 0.125 MG tablet TAKE 2 TABLETS BY MOUTH EVERY NIGHT AT BEDTIME AS NEEDED    . QUININE SULFATE PO Take 260 mg by mouth at bedtime.    . simvastatin (ZOCOR) 40 MG tablet Take 40 mg by mouth daily.    . sitaGLIPtin (JANUVIA) 100 MG tablet Take 50 mg by mouth daily.    . traMADol (ULTRAM) 50 MG tablet Take 1 tablet 3 times a day by oral route.    . Homeopathic Products (ZICAM ALLERGY RELIEF NA) Place 1 spray into the nose daily.    Marland Kitchen HYDROcodone-acetaminophen (NORCO/VICODIN) 5-325 MG tablet Take 1 tablet by mouth 3 (three) times daily as needed for pain.  0  . methocarbamol (ROBAXIN) 500 MG tablet Take 1 tablet (500 mg total) by mouth 3 (three) times daily as needed for muscle spasms. 60 tablet 0  . potassium chloride (K-DUR) 10 MEQ tablet Take 2 tablets (20 mEq total) by mouth 2 (two) times daily. 360 tablet 3  . TRESIBA FLEXTOUCH 200 UNIT/ML SOPN Inject 100 Units into the skin every morning.  5   No current facility-administered medications for this encounter.     Allergies  Allergen Reactions  . Penicillins Other (See Comments)    Severe diarrhea Has patient had a PCN reaction causing immediate rash, facial/tongue/throat swelling, SOB or lightheadedness with hypotension: No Has patient had a PCN reaction causing severe rash involving mucus membranes or skin necrosis: No Has patient had a PCN reaction that required hospitalization No Has patient had a PCN reaction occurring within the last 10 years: No If all of the above answers are "NO", then may proceed with Cephalosporin use.   . Codeine Other (See Comments)    Unknown  . Adhesive [Tape] Rash  . Aspirin Other (See Comments)    Stomach burning  . Iodine Itching, Swelling and Rash    Can use if Benadryl and Prednisone are used first   . Ivp Dye [Iodinated Diagnostic Agents] Other (See Comments)    Can use if Benadryl and Prednisone are used first    . Sulfa Antibiotics Diarrhea        Social History   Socioeconomic History  . Marital status: Widowed    Spouse name: Not on file  . Number of children: Not on file  . Years of education: Not on file  . Highest education  level: Not on file  Occupational History  . Not on file  Social Needs  . Financial resource strain: Not on file  . Food insecurity:    Worry: Not on file    Inability: Not on file  . Transportation needs:    Medical: Not on file    Non-medical: Not on file  Tobacco Use  . Smoking status: Former Smoker    Start date: 09/10/1953    Last attempt to quit: 09/11/1983    Years since quitting: 34.6  . Smokeless tobacco: Never Used  Substance and Sexual Activity  . Alcohol use: Yes    Alcohol/week: 0.0 oz    Comment: rare glass of wine  . Drug use: No  . Sexual activity: Not on file  Lifestyle  . Physical activity:    Days per week: Not on file    Minutes per session: Not on file  . Stress: Not on file  Relationships  . Social connections:    Talks on phone: Not on file    Gets together: Not on file    Attends religious service: Not on file    Active member of club or organization: Not on file    Attends meetings of clubs or organizations: Not on file    Relationship status: Not on file  . Intimate partner violence:    Fear of current or ex partner: Not on file    Emotionally abused: Not on file    Physically abused: Not on file    Forced sexual activity: Not on file  Other Topics Concern  . Not on file  Social History Narrative  . Not on file      Family History  Problem Relation Age of Onset  . CAD Unknown   . Sick sinus syndrome Brother        has a PPM    Vitals:   04/09/18 1111  BP: (!) 166/82  Pulse: 67  SpO2: 96%  Weight: 223 lb 12.8 oz (101.5 kg)   Filed Weights   04/09/18 1111  Weight: 223 lb 12.8 oz (101.5 kg)   ReDS Vest - 04/09/18 1100      ReDS Vest   Fitting Posture  Sitting    Height Marker  Short    Ruler Value  35.5    Center Strip  Aligned     ReDS Value  27       PHYSICAL EXAM: General:  Elderly. No respiratory difficulty. Walked slowly in the clinic HEENT: normal Neck: supple. JVP elevated. Carotids 2+ bilat; no bruits. No lymphadenopathy or thryomegaly appreciated. Cor: PMI nondisplaced. Regular rate & rhythm. No rubs, gallops or murmurs. Lungs: clear  Abdomen: obese, soft, nontender, nondistended. No hepatosplenomegaly. No bruits or masses. Good bowel sounds. Extremities: no cyanosis, clubbing, rash, R and LLE 2-3+edema Neuro: alert & oriented x 3, cranial nerves grossly intact. moves all 4 extremities w/o difficulty. Affect pleasant.  ASSESSMENT & PLAN:  1. Chronic Diastolic Heart Failure  Vest Reading 27% but suspect she has R sided HF.  No recent ECHO. Plan to repeat ECHO at next visit.  NYHA III. Volume status elevated. Will need to restart torsemide 40 mg daily. No metolazone.  If volume status remains elevated and she remains symptomatic we will set up [place].  Discussed low salt food choices, and daily weights, and limiting fluid intake to < 2 liters per day.   2. Suspected Sleep Apnea  She would benefit from sleep study but she declines. Says  she is claustrophobic and does not want pursue.   3. AKI on CKD Stage III Check BMET. I personally discussed results today.   4. Obesity  Body mass index is 39.64 kg/m. Discussed portion control.   We personally called her pharmacy to determine the diuretics she was prescribed.   Follow up in 2-3 weeks with an ECHO. If no improvement will need to set up [place].  Greater than 50% of the 40 minute visit was spent in counseling/coordination of care  regarding disease state education, salt/fluid restriction, sliding scale diuretics, and medication additions.    [name] Errickson NP-C   1:15 PM

## 2018-04-09 NOTE — Progress Notes (Signed)
ReDS Vest - 04/09/18 1100      ReDS Vest   Fitting Posture  Sitting    Height Marker  Short    Ruler Value  35.5    Center Strip  Aligned    ReDS Value  27

## 2018-04-14 ENCOUNTER — Telehealth (HOSPITAL_COMMUNITY): Payer: Self-pay | Admitting: *Deleted

## 2018-04-14 NOTE — Telephone Encounter (Signed)
Patient called to report that since starting torsemide last week she lost weight the first day but has maintained 214 lbs since.  She hasn't gained or lost anymore.  She complains of increased leg swelling but hasn't been elevated them like how she should.  I advised her since her weight was down she should keep legs elevated as much as possible while sitting during the day, decrease her fluid and sodium intake, and to call our office back if she doesn't see any changes in leg swelling over the next day or 2.  She verbalized understanding and no further questions.

## 2018-04-23 ENCOUNTER — Encounter (HOSPITAL_COMMUNITY): Payer: Medicare Other

## 2018-04-23 ENCOUNTER — Ambulatory Visit (HOSPITAL_COMMUNITY): Admission: RE | Admit: 2018-04-23 | Payer: Medicare Other | Source: Ambulatory Visit

## 2018-05-13 NOTE — Progress Notes (Signed)
ADVANCED HF CLINIC CONSULT NOTE  Primary Care: Pomposini, [name], MD EP: Dr Caryl Comes HF: Dr Haroldine Laws  HPI: She is being seen today at the request of Dr Caryl Comes for HF consultation.   Laparis Durrett is a 77 y.o. female with history of diastolic heart failure, DM, GERD, colon cancer, CKD stage III, 2016 pacemaker (MDT) due to symptomatic sinus node dysfunction. Has never had a cath.  In July 2019, she had 12 pound weight and she was sent to the ED by her PCP. She was sent home on metolazone every other day for 14 days. She took 4 doses and her creatinine went up to 3 so metolazone was stopped.   Today she returns for HF follow up. Last visit, torsemide 40 mg daily was restarted (she had been off diuretics for 7 days). Overall she is about the same. She is SOB with exertion, sometimes with chores around the house. Able to walk in the grocery store with breaks. Not very active. BLE is somewhat improved. +orthopnea. Sleeps in a recliner. +bendopnea. No CP or dizziness. Weights 209-215 lbs at home. Taking all medications. Tries to limit salt and fluid intake, but occasionally eats take out or deli meats. Weight down 2 lbs on our scale.   [employer] PPM interrogated: No AF/AT, average HR 60-70, active ~1.5 hours daily  06/21/2015 Myoview   Nuclear stress EF: 64%.  There was no ST segment deviation noted during stress.  The study is normal.  This is a low risk study. No ischemia identified.  SH: Former 30-40 pack year smoker, quit 25 years ago.   Review of systems complete and found to be negative unless listed in HPI.   Past Medical History:  Diagnosis Date  . Anxiety   . Arthritis   . Cancer ([place])    colon  . Chronic kidney disease    stage 3  . Chronic pain syndrome   . Diabetes ([place])   . Diabetic neuropathy ([place])   . Dyslipidemia   . Early cataracts, bilateral   . Fibromyalgia   . GERD (gastroesophageal reflux disease)   . H/O syncope   . Heart murmur   . Hypertension   .  Lumbar spinal stenosis    and scoliosis  . Pneumonia   . PONV (postoperative nausea and vomiting)   . Presence of permanent cardiac pacemaker   . Restless legs   . Spinal headache   . Thyroid nodule     Current Outpatient Medications  Medication Sig Dispense Refill  . acetaminophen (TYLENOL) 650 MG CR tablet Take 650 mg by mouth every 8 (eight) hours as needed for pain.    . Cholecalciferol (VITAMIN D) 2000 UNITS tablet Take 2,000 Units by mouth daily.    . cyclobenzaprine (FLEXERIL) 10 MG tablet Take 10 mg by mouth 3 (three) times daily as needed for muscle spasms.    . diphenhydrAMINE (BENADRYL) 50 MG capsule Take 50 mg by mouth at bedtime as needed.    . DULoxetine (CYMBALTA) 20 MG capsule Take 20 mg by mouth daily.    . fenofibrate 160 MG tablet Take 160 mg by mouth daily.    Marland Kitchen gabapentin (NEURONTIN) 800 MG tablet Take 800 mg by mouth 3 (three) times daily.    . Homeopathic Products (ZICAM ALLERGY RELIEF NA) Place 1 spray into the nose daily.    . hydrALAZINE (APRESOLINE) 25 MG tablet Take 25 mg by mouth 2 (two) times daily.  5  . HYDROcodone-acetaminophen (NORCO/VICODIN) 5-325 MG  tablet Take 1 tablet by mouth 3 (three) times daily as needed for pain.  0  . irbesartan (AVAPRO) 300 MG tablet Take 300 mg by mouth daily.  3  . labetalol (NORMODYNE) 200 MG tablet TAKE 1 TABLET(200 MG) BY MOUTH TWICE DAILY 180 tablet 2  . methocarbamol (ROBAXIN) 500 MG tablet Take 1 tablet (500 mg total) by mouth 3 (three) times daily as needed for muscle spasms. 60 tablet 0  . omeprazole (PRILOSEC) 20 MG capsule Take 20 mg by mouth daily.    . potassium chloride (K-DUR) 10 MEQ tablet Take 2 tablets (20 mEq total) by mouth 2 (two) times daily. 360 tablet 3  . pramipexole (MIRAPEX) 0.125 MG tablet TAKE 2 TABLETS BY MOUTH EVERY NIGHT AT BEDTIME AS NEEDED    . QUININE SULFATE PO Take 260 mg by mouth at bedtime.    . simvastatin (ZOCOR) 40 MG tablet Take 40 mg by mouth daily.    . sitaGLIPtin (JANUVIA) 100  MG tablet Take 50 mg by mouth daily.    Marland Kitchen torsemide (DEMADEX) 20 MG tablet TAKE 2 TABLETS(40 MG) BY MOUTH DAILY 180 tablet 3  . traMADol (ULTRAM) 50 MG tablet Take 1 tablet 3 times a day by oral route.    . TRESIBA FLEXTOUCH 200 UNIT/ML SOPN Inject 100 Units into the skin every morning.  5   No current facility-administered medications for this encounter.     Allergies  Allergen Reactions  . Penicillins Other (See Comments)    Severe diarrhea Has patient had a PCN reaction causing immediate rash, facial/tongue/throat swelling, SOB or lightheadedness with hypotension: No Has patient had a PCN reaction causing severe rash involving mucus membranes or skin necrosis: No Has patient had a PCN reaction that required hospitalization No Has patient had a PCN reaction occurring within the last 10 years: No If all of the above answers are "NO", then may proceed with Cephalosporin use.   . Codeine Other (See Comments)    Unknown  . Adhesive [Tape] Rash  . Aspirin Other (See Comments)    Stomach burning  . Iodine Itching, Swelling and Rash    Can use if Benadryl and Prednisone are used first   . Ivp Dye [Iodinated Diagnostic Agents] Other (See Comments)    Can use if Benadryl and Prednisone are used first    . Sulfa Antibiotics Diarrhea      Social History   Socioeconomic History  . Marital status: Widowed    Spouse name: Not on file  . Number of children: Not on file  . Years of education: Not on file  . Highest education level: Not on file  Occupational History  . Not on file  Social Needs  . Financial resource strain: Not on file  . Food insecurity:    Worry: Not on file    Inability: Not on file  . Transportation needs:    Medical: Not on file    Non-medical: Not on file  Tobacco Use  . Smoking status: Former Smoker    Start date: 09/10/1953    Last attempt to quit: 09/11/1983    Years since quitting: 34.6  . Smokeless tobacco: Never Used  Substance and Sexual Activity  .  Alcohol use: Yes    Alcohol/week: 0.0 standard drinks    Comment: rare glass of wine  . Drug use: No  . Sexual activity: Not on file  Lifestyle  . Physical activity:    Days per week: Not on file  Minutes per session: Not on file  . Stress: Not on file  Relationships  . Social connections:    Talks on phone: Not on file    Gets together: Not on file    Attends religious service: Not on file    Active member of club or organization: Not on file    Attends meetings of clubs or organizations: Not on file    Relationship status: Not on file  . Intimate partner violence:    Fear of current or ex partner: Not on file    Emotionally abused: Not on file    Physically abused: Not on file    Forced sexual activity: Not on file  Other Topics Concern  . Not on file  Social History Narrative  . Not on file      Family History  Problem Relation Age of Onset  . CAD Unknown   . Sick sinus syndrome Brother        has a PPM    Vitals:   05/14/18 1419  BP: 140/82  Pulse: 88  SpO2: 94%  Weight: 100.5 kg (221 lb 8 oz)   Filed Weights   05/14/18 1419  Weight: 100.5 kg (221 lb 8 oz)     Wt Readings from Last 3 Encounters:  05/14/18 100.5 kg (221 lb 8 oz)  04/09/18 101.5 kg (223 lb 12.8 oz)  10/07/17 96.8 kg (213 lb 6.4 oz)     PHYSICAL EXAM: General: Elderly. No resp difficulty. HEENT: Normal Neck: Supple. JVP 7-8. Carotids 2+ bilat; no bruits. No thyromegaly or nodule noted. Cor: PMI nondisplaced. RRR, No M/G/R noted Lungs: CTAB, normal effort. Abdomen: Soft, non-tender, non-distended, no HSM. No bruits or masses. +BS  Extremities: No cyanosis, clubbing, or rash. R and LLE 1+ edema in ankles Neuro: Alert & orientedx3, cranial nerves grossly intact. moves all 4 extremities w/o difficulty. Affect pleasant   ASSESSMENT & PLAN:  1. Chronic Diastolic Heart Failure  Echo today: EF 50-55%, posterior basal and inferolateral HK, mild LVH, LA mildly dilated - NYHA III -  Volume status stable on exam. - Continue torsemide 40 mg daily. Not taking potassium. BMET today - Hold off on spiro with recent AKI - Set up for [facility] to assess volume, pulmonary pressures, and assess for CAD. - Discussed low salt food choices, and daily weights, and limiting fluid intake to < 2 liters per day.   2. Dyspnea - Set up [facility] as above. She has several risk factors for CAD (HTN, obesity, DM, former tobacco use) - Refer for sleep study as below - Check PFTs with DLCO - O2 sats remained stable 94-100% while walking around the clinic  3. Suspected Sleep Apnea  - Agreeable to sleep study referral. She does not think she would be able to tolerate a mask. Will see if Dr Radford Pax can work with her to find something that works if she does have sleep apnea.   4. CKD Stage III - BMET today   5. Obesity  Body mass index is 39.24 kg/m. - Discussed portion control.  - Encouraged her to be more active. Refer to PT in [place].   6. Sinus node dysfunction s/p MDT PPM - Follows with Dr Caryl Comes - Good HR variation with walking around clinic 64 resting to 95 bpm  7. HTN - Elevated today - Continue labetolol 200 mg BID - Continue irbesartan 300 mg daily - Increase hydralazine to 25 mg TID (from BID)  8. DM - Consider jardiance. GFR  lower with recent AKI, but baseline seems to be 30-45  9. Deconditioning - She has bilateral knee pain and balance problems, which keep her from being more active - Refer to PT in [facility] today Increase hydralazine to 25 mg TID Set up [facility] with Dr Haroldine Laws Set up PFTs with DLCO Refer for sleep study Refer for physical therapy in [place] Follow up 4-6 weeks with Dr Haroldine Laws  Georgiana Shore, NP 2:22 PM  Greater than 50% of the 25 minute visit was spent in counseling/coordination of care regarding disease state education, salt/fluid restriction, sliding scale diuretics, and medication compliance.  Patient seen and examined with the  above-signed Advanced Practice Provider and/or Housestaff. I personally reviewed laboratory data, imaging studies and relevant notes. I independently examined the patient and formulated the important aspects of the plan. I have edited the note to reflect any of my changes or salient points. I have personally discussed the plan with the patient and/or family.  Very pleasant 77 y/o women with h/o tobacco use, obesity, longstanding DM2, CKD 3, SSS s/p PPM referred by Dr. Caryl Comes for progressive dyspnea.   Echo reviewed personally and EF 50-55% with mild HK of basilar inferolateral wall. RV ok. Previous Myoview ok. Has never had cath.   Has chronic NYHA II-III dyspnea.   On exam obese woman NAD Volume status looks good. No obvious murmurs. No s3. Lungs clear. AB marked central obesity. No edema   ECG with atrial pacing at 60.   I did hall walk with her and sats stayed 93-95% and HR increased into 90s. She struggles to walk due to orthopedic issues.   I had long talk with her and her daughter and we discussed many possible etiologies for her DOE including:  1) CAD 2) HF 3) COPD 4) OSA 5) Relative bradycardia with inappropriate rate-response on PPM 6) PAH 7) obesity/deconditioning  Given abnormal echo will proceed with R/L cath to exclude critical CAD and PAH. Will also get PFTs and sleep study. We will reconvene in several weeks to review results but I told her that I suspect her obesity and deconditioning will be major factors here and we will have to help her find a way to lose weight and build back her endurance to be successful in the long run.   Total time personally spent > 45 minutes. Over half that time spent discussing above.   Glori Bickers, MD  12:28 AM

## 2018-05-13 NOTE — H&P (View-Only) (Signed)
ADVANCED HF CLINIC CONSULT NOTE  Primary Care: Pomposini, [name], MD EP: Dr Caryl Comes HF: Dr Haroldine Laws  HPI: She is being seen today at the request of Dr Caryl Comes for HF consultation.   Cira Deyoe is a 77 y.o. female with history of diastolic heart failure, DM, GERD, colon cancer, CKD stage III, 2016 pacemaker (MDT) due to symptomatic sinus node dysfunction. Has never had a cath.  In July 2019, she had 12 pound weight and she was sent to the ED by her PCP. She was sent home on metolazone every other day for 14 days. She took 4 doses and her creatinine went up to 3 so metolazone was stopped.   Today she returns for HF follow up. Last visit, torsemide 40 mg daily was restarted (she had been off diuretics for 7 days). Overall she is about the same. She is SOB with exertion, sometimes with chores around the house. Able to walk in the grocery store with breaks. Not very active. BLE is somewhat improved. +orthopnea. Sleeps in a recliner. +bendopnea. No CP or dizziness. Weights 209-215 lbs at home. Taking all medications. Tries to limit salt and fluid intake, but occasionally eats take out or deli meats. Weight down 2 lbs on our scale.   [employer] PPM interrogated: No AF/AT, average HR 60-70, active ~1.5 hours daily  06/21/2015 Myoview   Nuclear stress EF: 64%.  There was no ST segment deviation noted during stress.  The study is normal.  This is a low risk study. No ischemia identified.  SH: Former 30-40 pack year smoker, quit 25 years ago.   Review of systems complete and found to be negative unless listed in HPI.   Past Medical History:  Diagnosis Date  . Anxiety   . Arthritis   . Cancer ([place])    colon  . Chronic kidney disease    stage 3  . Chronic pain syndrome   . Diabetes ([place])   . Diabetic neuropathy ([place])   . Dyslipidemia   . Early cataracts, bilateral   . Fibromyalgia   . GERD (gastroesophageal reflux disease)   . H/O syncope   . Heart murmur   . Hypertension   .  Lumbar spinal stenosis    and scoliosis  . Pneumonia   . PONV (postoperative nausea and vomiting)   . Presence of permanent cardiac pacemaker   . Restless legs   . Spinal headache   . Thyroid nodule     Current Outpatient Medications  Medication Sig Dispense Refill  . acetaminophen (TYLENOL) 650 MG CR tablet Take 650 mg by mouth every 8 (eight) hours as needed for pain.    . Cholecalciferol (VITAMIN D) 2000 UNITS tablet Take 2,000 Units by mouth daily.    . cyclobenzaprine (FLEXERIL) 10 MG tablet Take 10 mg by mouth 3 (three) times daily as needed for muscle spasms.    . diphenhydrAMINE (BENADRYL) 50 MG capsule Take 50 mg by mouth at bedtime as needed.    . DULoxetine (CYMBALTA) 20 MG capsule Take 20 mg by mouth daily.    . fenofibrate 160 MG tablet Take 160 mg by mouth daily.    Marland Kitchen gabapentin (NEURONTIN) 800 MG tablet Take 800 mg by mouth 3 (three) times daily.    . Homeopathic Products (ZICAM ALLERGY RELIEF NA) Place 1 spray into the nose daily.    . hydrALAZINE (APRESOLINE) 25 MG tablet Take 25 mg by mouth 2 (two) times daily.  5  . HYDROcodone-acetaminophen (NORCO/VICODIN) 5-325 MG  tablet Take 1 tablet by mouth 3 (three) times daily as needed for pain.  0  . irbesartan (AVAPRO) 300 MG tablet Take 300 mg by mouth daily.  3  . labetalol (NORMODYNE) 200 MG tablet TAKE 1 TABLET(200 MG) BY MOUTH TWICE DAILY 180 tablet 2  . methocarbamol (ROBAXIN) 500 MG tablet Take 1 tablet (500 mg total) by mouth 3 (three) times daily as needed for muscle spasms. 60 tablet 0  . omeprazole (PRILOSEC) 20 MG capsule Take 20 mg by mouth daily.    . potassium chloride (K-DUR) 10 MEQ tablet Take 2 tablets (20 mEq total) by mouth 2 (two) times daily. 360 tablet 3  . pramipexole (MIRAPEX) 0.125 MG tablet TAKE 2 TABLETS BY MOUTH EVERY NIGHT AT BEDTIME AS NEEDED    . QUININE SULFATE PO Take 260 mg by mouth at bedtime.    . simvastatin (ZOCOR) 40 MG tablet Take 40 mg by mouth daily.    . sitaGLIPtin (JANUVIA) 100  MG tablet Take 50 mg by mouth daily.    Marland Kitchen torsemide (DEMADEX) 20 MG tablet TAKE 2 TABLETS(40 MG) BY MOUTH DAILY 180 tablet 3  . traMADol (ULTRAM) 50 MG tablet Take 1 tablet 3 times a day by oral route.    . TRESIBA FLEXTOUCH 200 UNIT/ML SOPN Inject 100 Units into the skin every morning.  5   No current facility-administered medications for this encounter.     Allergies  Allergen Reactions  . Penicillins Other (See Comments)    Severe diarrhea Has patient had a PCN reaction causing immediate rash, facial/tongue/throat swelling, SOB or lightheadedness with hypotension: No Has patient had a PCN reaction causing severe rash involving mucus membranes or skin necrosis: No Has patient had a PCN reaction that required hospitalization No Has patient had a PCN reaction occurring within the last 10 years: No If all of the above answers are "NO", then may proceed with Cephalosporin use.   . Codeine Other (See Comments)    Unknown  . Adhesive [Tape] Rash  . Aspirin Other (See Comments)    Stomach burning  . Iodine Itching, Swelling and Rash    Can use if Benadryl and Prednisone are used first   . Ivp Dye [Iodinated Diagnostic Agents] Other (See Comments)    Can use if Benadryl and Prednisone are used first    . Sulfa Antibiotics Diarrhea      Social History   Socioeconomic History  . Marital status: Widowed    Spouse name: Not on file  . Number of children: Not on file  . Years of education: Not on file  . Highest education level: Not on file  Occupational History  . Not on file  Social Needs  . Financial resource strain: Not on file  . Food insecurity:    Worry: Not on file    Inability: Not on file  . Transportation needs:    Medical: Not on file    Non-medical: Not on file  Tobacco Use  . Smoking status: Former Smoker    Start date: 09/10/1953    Last attempt to quit: 09/11/1983    Years since quitting: 34.6  . Smokeless tobacco: Never Used  Substance and Sexual Activity  .  Alcohol use: Yes    Alcohol/week: 0.0 standard drinks    Comment: rare glass of wine  . Drug use: No  . Sexual activity: Not on file  Lifestyle  . Physical activity:    Days per week: Not on file  Minutes per session: Not on file  . Stress: Not on file  Relationships  . Social connections:    Talks on phone: Not on file    Gets together: Not on file    Attends religious service: Not on file    Active member of club or organization: Not on file    Attends meetings of clubs or organizations: Not on file    Relationship status: Not on file  . Intimate partner violence:    Fear of current or ex partner: Not on file    Emotionally abused: Not on file    Physically abused: Not on file    Forced sexual activity: Not on file  Other Topics Concern  . Not on file  Social History Narrative  . Not on file      Family History  Problem Relation Age of Onset  . CAD Unknown   . Sick sinus syndrome Brother        has a PPM    Vitals:   05/14/18 1419  BP: 140/82  Pulse: 88  SpO2: 94%  Weight: 100.5 kg (221 lb 8 oz)   Filed Weights   05/14/18 1419  Weight: 100.5 kg (221 lb 8 oz)     Wt Readings from Last 3 Encounters:  05/14/18 100.5 kg (221 lb 8 oz)  04/09/18 101.5 kg (223 lb 12.8 oz)  10/07/17 96.8 kg (213 lb 6.4 oz)     PHYSICAL EXAM: General: Elderly. No resp difficulty. HEENT: Normal Neck: Supple. JVP 7-8. Carotids 2+ bilat; no bruits. No thyromegaly or nodule noted. Cor: PMI nondisplaced. RRR, No M/G/R noted Lungs: CTAB, normal effort. Abdomen: Soft, non-tender, non-distended, no HSM. No bruits or masses. +BS  Extremities: No cyanosis, clubbing, or rash. R and LLE 1+ edema in ankles Neuro: Alert & orientedx3, cranial nerves grossly intact. moves all 4 extremities w/o difficulty. Affect pleasant   ASSESSMENT & PLAN:  1. Chronic Diastolic Heart Failure  Echo today: EF 50-55%, posterior basal and inferolateral HK, mild LVH, LA mildly dilated - NYHA III -  Volume status stable on exam. - Continue torsemide 40 mg daily. Not taking potassium. BMET today - Hold off on spiro with recent AKI - Set up for [facility] to assess volume, pulmonary pressures, and assess for CAD. - Discussed low salt food choices, and daily weights, and limiting fluid intake to < 2 liters per day.   2. Dyspnea - Set up [facility] as above. She has several risk factors for CAD (HTN, obesity, DM, former tobacco use) - Refer for sleep study as below - Check PFTs with DLCO - O2 sats remained stable 94-100% while walking around the clinic  3. Suspected Sleep Apnea  - Agreeable to sleep study referral. She does not think she would be able to tolerate a mask. Will see if Dr Radford Pax can work with her to find something that works if she does have sleep apnea.   4. CKD Stage III - BMET today   5. Obesity  Body mass index is 39.24 kg/m. - Discussed portion control.  - Encouraged her to be more active. Refer to PT in [place].   6. Sinus node dysfunction s/p MDT PPM - Follows with Dr Caryl Comes - Good HR variation with walking around clinic 64 resting to 95 bpm  7. HTN - Elevated today - Continue labetolol 200 mg BID - Continue irbesartan 300 mg daily - Increase hydralazine to 25 mg TID (from BID)  8. DM - Consider jardiance. GFR  lower with recent AKI, but baseline seems to be 30-45  9. Deconditioning - She has bilateral knee pain and balance problems, which keep her from being more active - Refer to PT in [facility] today Increase hydralazine to 25 mg TID Set up [facility] with Dr Haroldine Laws Set up PFTs with DLCO Refer for sleep study Refer for physical therapy in [place] Follow up 4-6 weeks with Dr Haroldine Laws  Georgiana Shore, NP 2:22 PM  Greater than 50% of the 25 minute visit was spent in counseling/coordination of care regarding disease state education, salt/fluid restriction, sliding scale diuretics, and medication compliance.  Patient seen and examined with the  above-signed Advanced Practice Provider and/or Housestaff. I personally reviewed laboratory data, imaging studies and relevant notes. I independently examined the patient and formulated the important aspects of the plan. I have edited the note to reflect any of my changes or salient points. I have personally discussed the plan with the patient and/or family.  Very pleasant 77 y/o women with h/o tobacco use, obesity, longstanding DM2, CKD 3, SSS s/p PPM referred by Dr. Caryl Comes for progressive dyspnea.   Echo reviewed personally and EF 50-55% with mild HK of basilar inferolateral wall. RV ok. Previous Myoview ok. Has never had cath.   Has chronic NYHA II-III dyspnea.   On exam obese woman NAD Volume status looks good. No obvious murmurs. No s3. Lungs clear. AB marked central obesity. No edema   ECG with atrial pacing at 60.   I did hall walk with her and sats stayed 93-95% and HR increased into 90s. She struggles to walk due to orthopedic issues.   I had long talk with her and her daughter and we discussed many possible etiologies for her DOE including:  1) CAD 2) HF 3) COPD 4) OSA 5) Relative bradycardia with inappropriate rate-response on PPM 6) PAH 7) obesity/deconditioning  Given abnormal echo will proceed with R/L cath to exclude critical CAD and PAH. Will also get PFTs and sleep study. We will reconvene in several weeks to review results but I told her that I suspect her obesity and deconditioning will be major factors here and we will have to help her find a way to lose weight and build back her endurance to be successful in the long run.   Total time personally spent > 45 minutes. Over half that time spent discussing above.   Glori Bickers, MD  12:28 AM

## 2018-05-14 ENCOUNTER — Ambulatory Visit (HOSPITAL_COMMUNITY)
Admission: RE | Admit: 2018-05-14 | Discharge: 2018-05-14 | Disposition: A | Discharge status: Home / Self Care | Payer: Medicare Other | Source: Ambulatory Visit | Attending: Adult Health | Admitting: Adult Health

## 2018-05-14 ENCOUNTER — Encounter (HOSPITAL_COMMUNITY): Payer: Self-pay

## 2018-05-14 ENCOUNTER — Other Ambulatory Visit: Payer: Self-pay

## 2018-05-14 ENCOUNTER — Encounter (HOSPITAL_COMMUNITY): Payer: Self-pay | Admitting: *Deleted

## 2018-05-14 ENCOUNTER — Ambulatory Visit (HOSPITAL_BASED_OUTPATIENT_CLINIC_OR_DEPARTMENT_OTHER)
Admission: RE | Admit: 2018-05-14 | Discharge: 2018-05-14 | Disposition: A | Discharge status: Home / Self Care | Payer: Medicare Other | Source: Ambulatory Visit | Attending: Cardiology | Admitting: Cardiology

## 2018-05-14 VITALS — BP 140/82 | HR 88 | Wt 221.5 lb

## 2018-05-14 DIAGNOSIS — M25562 Pain in left knee: Secondary | ICD-10-CM | POA: Diagnosis not present

## 2018-05-14 DIAGNOSIS — R0600 Dyspnea, unspecified: Secondary | ICD-10-CM

## 2018-05-14 DIAGNOSIS — Z87891 Personal history of nicotine dependence: Secondary | ICD-10-CM | POA: Insufficient documentation

## 2018-05-14 DIAGNOSIS — F419 Anxiety disorder, unspecified: Secondary | ICD-10-CM | POA: Insufficient documentation

## 2018-05-14 DIAGNOSIS — E119 Type 2 diabetes mellitus without complications: Secondary | ICD-10-CM

## 2018-05-14 DIAGNOSIS — I495 Sick sinus syndrome: Secondary | ICD-10-CM

## 2018-05-14 DIAGNOSIS — I130 Hypertensive heart and chronic kidney disease with heart failure and stage 1 through stage 4 chronic kidney disease, or unspecified chronic kidney disease: Secondary | ICD-10-CM | POA: Diagnosis not present

## 2018-05-14 DIAGNOSIS — M25561 Pain in right knee: Secondary | ICD-10-CM | POA: Insufficient documentation

## 2018-05-14 DIAGNOSIS — Z79899 Other long term (current) drug therapy: Secondary | ICD-10-CM | POA: Insufficient documentation

## 2018-05-14 DIAGNOSIS — M797 Fibromyalgia: Secondary | ICD-10-CM | POA: Insufficient documentation

## 2018-05-14 DIAGNOSIS — N179 Acute kidney failure, unspecified: Secondary | ICD-10-CM | POA: Insufficient documentation

## 2018-05-14 DIAGNOSIS — Z886 Allergy status to analgesic agent status: Secondary | ICD-10-CM | POA: Diagnosis not present

## 2018-05-14 DIAGNOSIS — M48061 Spinal stenosis, lumbar region without neurogenic claudication: Secondary | ICD-10-CM | POA: Insufficient documentation

## 2018-05-14 DIAGNOSIS — K219 Gastro-esophageal reflux disease without esophagitis: Secondary | ICD-10-CM | POA: Insufficient documentation

## 2018-05-14 DIAGNOSIS — I2510 Atherosclerotic heart disease of native coronary artery without angina pectoris: Secondary | ICD-10-CM | POA: Diagnosis not present

## 2018-05-14 DIAGNOSIS — R0609 Other forms of dyspnea: Secondary | ICD-10-CM

## 2018-05-14 DIAGNOSIS — G894 Chronic pain syndrome: Secondary | ICD-10-CM | POA: Diagnosis not present

## 2018-05-14 DIAGNOSIS — Z8249 Family history of ischemic heart disease and other diseases of the circulatory system: Secondary | ICD-10-CM | POA: Insufficient documentation

## 2018-05-14 DIAGNOSIS — I5032 Chronic diastolic (congestive) heart failure: Secondary | ICD-10-CM | POA: Insufficient documentation

## 2018-05-14 DIAGNOSIS — M1990 Unspecified osteoarthritis, unspecified site: Secondary | ICD-10-CM | POA: Insufficient documentation

## 2018-05-14 DIAGNOSIS — N183 Chronic kidney disease, stage 3 unspecified: Secondary | ICD-10-CM

## 2018-05-14 DIAGNOSIS — E1140 Type 2 diabetes mellitus with diabetic neuropathy, unspecified: Secondary | ICD-10-CM | POA: Insufficient documentation

## 2018-05-14 DIAGNOSIS — Z880 Allergy status to penicillin: Secondary | ICD-10-CM | POA: Diagnosis not present

## 2018-05-14 DIAGNOSIS — I5022 Chronic systolic (congestive) heart failure: Secondary | ICD-10-CM | POA: Diagnosis not present

## 2018-05-14 DIAGNOSIS — Z888 Allergy status to other drugs, medicaments and biological substances status: Secondary | ICD-10-CM | POA: Insufficient documentation

## 2018-05-14 DIAGNOSIS — J449 Chronic obstructive pulmonary disease, unspecified: Secondary | ICD-10-CM | POA: Diagnosis not present

## 2018-05-14 DIAGNOSIS — Z831 Family history of other infectious and parasitic diseases: Secondary | ICD-10-CM | POA: Diagnosis not present

## 2018-05-14 DIAGNOSIS — Z950 Presence of cardiac pacemaker: Secondary | ICD-10-CM | POA: Insufficient documentation

## 2018-05-14 DIAGNOSIS — E669 Obesity, unspecified: Secondary | ICD-10-CM | POA: Diagnosis not present

## 2018-05-14 DIAGNOSIS — I081 Rheumatic disorders of both mitral and tricuspid valves: Secondary | ICD-10-CM | POA: Insufficient documentation

## 2018-05-14 DIAGNOSIS — Z882 Allergy status to sulfonamides status: Secondary | ICD-10-CM | POA: Insufficient documentation

## 2018-05-14 DIAGNOSIS — R0683 Snoring: Secondary | ICD-10-CM | POA: Diagnosis not present

## 2018-05-14 DIAGNOSIS — Z09 Encounter for follow-up examination after completed treatment for conditions other than malignant neoplasm: Secondary | ICD-10-CM | POA: Diagnosis present

## 2018-05-14 DIAGNOSIS — G4733 Obstructive sleep apnea (adult) (pediatric): Secondary | ICD-10-CM | POA: Insufficient documentation

## 2018-05-14 DIAGNOSIS — E785 Hyperlipidemia, unspecified: Secondary | ICD-10-CM | POA: Insufficient documentation

## 2018-05-14 DIAGNOSIS — Z885 Allergy status to narcotic agent status: Secondary | ICD-10-CM | POA: Insufficient documentation

## 2018-05-14 DIAGNOSIS — Z794 Long term (current) use of insulin: Secondary | ICD-10-CM

## 2018-05-14 DIAGNOSIS — C189 Malignant neoplasm of colon, unspecified: Secondary | ICD-10-CM | POA: Insufficient documentation

## 2018-05-14 DIAGNOSIS — Z6839 Body mass index (BMI) 39.0-39.9, adult: Secondary | ICD-10-CM | POA: Diagnosis not present

## 2018-05-14 DIAGNOSIS — Z7984 Long term (current) use of oral hypoglycemic drugs: Secondary | ICD-10-CM | POA: Insufficient documentation

## 2018-05-14 DIAGNOSIS — G2581 Restless legs syndrome: Secondary | ICD-10-CM | POA: Insufficient documentation

## 2018-05-14 LAB — BASIC METABOLIC PANEL
Anion gap: 10 (ref 5–15)
BUN: 36 mg/dL — ABNORMAL HIGH (ref 8–23)
CO2: 29 mmol/L (ref 22–32)
Calcium: 9.8 mg/dL (ref 8.9–10.3)
Chloride: 103 mmol/L (ref 98–111)
Creatinine, Ser: 1.92 mg/dL — ABNORMAL HIGH (ref 0.44–1.00)
GFR calc Af Amer: 27 mL/min — ABNORMAL LOW (ref >60)
GFR calc non Af Amer: 24 mL/min — ABNORMAL LOW (ref >60)
Glucose, Bld: 83 mg/dL (ref 70–99)
Potassium: 4.4 mmol/L (ref 3.5–5.1)
Sodium: 142 mmol/L (ref 135–145)

## 2018-05-14 MED ORDER — HYDRALAZINE HCL 25 MG PO TABS: 25.0000 mg | ORAL_TABLET | Freq: Three times a day (TID) | ORAL | 5 refills | Status: DC

## 2018-05-14 NOTE — Patient Instructions (Addendum)
Increase Hydralazine to 25mg  three times daily.   Routine lab work today. Will notify you of abnormal results  Your provider requests you have a sleep study.  *they will call you to schedule appointment*  Your provider requests you have pulmonary functions tests.  You have been scheduled for a right/left heart cath with Dr.Bensimhon. (please see cath instruction letter attached)  Follow up with Dr.Bensimhon 4-6 weeks after cath.

## 2018-05-15 ENCOUNTER — Other Ambulatory Visit (HOSPITAL_COMMUNITY): Payer: Self-pay | Admitting: *Deleted

## 2018-05-15 ENCOUNTER — Telehealth (HOSPITAL_COMMUNITY): Payer: Self-pay | Admitting: *Deleted

## 2018-05-15 DIAGNOSIS — I5032 Chronic diastolic (congestive) heart failure: Secondary | ICD-10-CM

## 2018-05-15 MED ORDER — TORSEMIDE 20 MG PO TABS: 20.0000 mg | ORAL_TABLET | Freq: Every day | ORAL | 3 refills | Status: DC

## 2018-05-15 NOTE — Telephone Encounter (Signed)
Result Notes for Basic metabolic panel   Notes recorded by Darron Doom, RN on 05/15/2018 at 3:48 PM EDT Patient called back and she will hold torsemide tomorrow and Saturday then will restart 20 mg Daily on Sunday. She confirmed she isn't taking any potassium. MAR updated ------  Notes recorded by Harvie Junior, CMA on 05/15/2018 at 2:16 PM EDT Left VM for pt to call for results. ------  Notes recorded by Georgiana Shore, NP on 05/15/2018 at 10:17 AM EDT Creatinine trending back up. Have her hold torsemide for 2 days, then restart at 20 mg daily. Please confirm that she is off of potassium. She will need a BMET Monday am prior to cath. ------  Notes recorded by Georgiana Shore, NP on 05/14/2018 at 4:17 PM EDT Creatinine trending back up. Have her hold torsemide for 2 days, then restart at 20 mg daily. Please confirm that she is off of potassium. She will need a BMET Monday am prior to cath.

## 2018-05-15 NOTE — Addendum Note (Signed)
Addended by: Scarlette Calico on: 05/15/2018 03:54 PM   Modules accepted: Orders

## 2018-05-16 ENCOUNTER — Telehealth (HOSPITAL_COMMUNITY): Payer: Self-pay

## 2018-05-16 DIAGNOSIS — I5032 Chronic diastolic (congestive) heart failure: Secondary | ICD-10-CM

## 2018-05-16 NOTE — Telephone Encounter (Signed)
Opened in error

## 2018-05-19 ENCOUNTER — Other Ambulatory Visit: Payer: Self-pay

## 2018-05-19 ENCOUNTER — Ambulatory Visit (HOSPITAL_COMMUNITY)
Admission: RE | Admit: 2018-05-19 | Discharge: 2018-05-19 | Disposition: A | Discharge status: Home / Self Care | Payer: Medicare Other | Source: Ambulatory Visit | Attending: Internal Medicine | Admitting: Internal Medicine

## 2018-05-19 ENCOUNTER — Encounter (HOSPITAL_COMMUNITY): Payer: Self-pay | Admitting: *Deleted

## 2018-05-19 ENCOUNTER — Encounter (HOSPITAL_COMMUNITY)
Admission: RE | Disposition: A | Discharge status: Home / Self Care | Payer: Self-pay | Source: Ambulatory Visit | Attending: Internal Medicine

## 2018-05-19 DIAGNOSIS — G4733 Obstructive sleep apnea (adult) (pediatric): Secondary | ICD-10-CM | POA: Insufficient documentation

## 2018-05-19 DIAGNOSIS — Z6839 Body mass index (BMI) 39.0-39.9, adult: Secondary | ICD-10-CM | POA: Insufficient documentation

## 2018-05-19 DIAGNOSIS — R0609 Other forms of dyspnea: Secondary | ICD-10-CM | POA: Insufficient documentation

## 2018-05-19 DIAGNOSIS — M1990 Unspecified osteoarthritis, unspecified site: Secondary | ICD-10-CM | POA: Diagnosis not present

## 2018-05-19 DIAGNOSIS — E669 Obesity, unspecified: Secondary | ICD-10-CM | POA: Insufficient documentation

## 2018-05-19 DIAGNOSIS — F419 Anxiety disorder, unspecified: Secondary | ICD-10-CM | POA: Diagnosis not present

## 2018-05-19 DIAGNOSIS — Z85038 Personal history of other malignant neoplasm of large intestine: Secondary | ICD-10-CM | POA: Insufficient documentation

## 2018-05-19 DIAGNOSIS — Z87891 Personal history of nicotine dependence: Secondary | ICD-10-CM | POA: Diagnosis not present

## 2018-05-19 DIAGNOSIS — Z91041 Radiographic dye allergy status: Secondary | ICD-10-CM | POA: Insufficient documentation

## 2018-05-19 DIAGNOSIS — I130 Hypertensive heart and chronic kidney disease with heart failure and stage 1 through stage 4 chronic kidney disease, or unspecified chronic kidney disease: Secondary | ICD-10-CM | POA: Diagnosis not present

## 2018-05-19 DIAGNOSIS — I495 Sick sinus syndrome: Secondary | ICD-10-CM | POA: Insufficient documentation

## 2018-05-19 DIAGNOSIS — E1122 Type 2 diabetes mellitus with diabetic chronic kidney disease: Secondary | ICD-10-CM | POA: Insufficient documentation

## 2018-05-19 DIAGNOSIS — G2581 Restless legs syndrome: Secondary | ICD-10-CM | POA: Diagnosis not present

## 2018-05-19 DIAGNOSIS — Z79899 Other long term (current) drug therapy: Secondary | ICD-10-CM | POA: Diagnosis not present

## 2018-05-19 DIAGNOSIS — I5032 Chronic diastolic (congestive) heart failure: Secondary | ICD-10-CM | POA: Diagnosis present

## 2018-05-19 DIAGNOSIS — E1140 Type 2 diabetes mellitus with diabetic neuropathy, unspecified: Secondary | ICD-10-CM | POA: Insufficient documentation

## 2018-05-19 DIAGNOSIS — Z880 Allergy status to penicillin: Secondary | ICD-10-CM | POA: Diagnosis not present

## 2018-05-19 DIAGNOSIS — Z886 Allergy status to analgesic agent status: Secondary | ICD-10-CM | POA: Insufficient documentation

## 2018-05-19 DIAGNOSIS — M48061 Spinal stenosis, lumbar region without neurogenic claudication: Secondary | ICD-10-CM | POA: Diagnosis not present

## 2018-05-19 DIAGNOSIS — Z885 Allergy status to narcotic agent status: Secondary | ICD-10-CM | POA: Diagnosis not present

## 2018-05-19 DIAGNOSIS — N183 Chronic kidney disease, stage 3 (moderate): Secondary | ICD-10-CM | POA: Insufficient documentation

## 2018-05-19 DIAGNOSIS — J449 Chronic obstructive pulmonary disease, unspecified: Secondary | ICD-10-CM | POA: Diagnosis not present

## 2018-05-19 DIAGNOSIS — Z8249 Family history of ischemic heart disease and other diseases of the circulatory system: Secondary | ICD-10-CM | POA: Diagnosis not present

## 2018-05-19 DIAGNOSIS — N179 Acute kidney failure, unspecified: Secondary | ICD-10-CM | POA: Diagnosis not present

## 2018-05-19 DIAGNOSIS — E785 Hyperlipidemia, unspecified: Secondary | ICD-10-CM | POA: Diagnosis not present

## 2018-05-19 DIAGNOSIS — K219 Gastro-esophageal reflux disease without esophagitis: Secondary | ICD-10-CM | POA: Diagnosis not present

## 2018-05-19 DIAGNOSIS — Z882 Allergy status to sulfonamides status: Secondary | ICD-10-CM | POA: Insufficient documentation

## 2018-05-19 DIAGNOSIS — M797 Fibromyalgia: Secondary | ICD-10-CM | POA: Insufficient documentation

## 2018-05-19 DIAGNOSIS — Z950 Presence of cardiac pacemaker: Secondary | ICD-10-CM | POA: Insufficient documentation

## 2018-05-19 DIAGNOSIS — Z888 Allergy status to other drugs, medicaments and biological substances status: Secondary | ICD-10-CM | POA: Insufficient documentation

## 2018-05-19 HISTORY — PX: RIGHT/LEFT HEART CATH AND CORONARY ANGIOGRAPHY: CATH118266

## 2018-05-19 LAB — POCT I-STAT 3, VENOUS BLOOD GAS (G3P V)
Acid-Base Excess: 1 mmol/L (ref 0.0–2.0)
Bicarbonate: 25.5 mmol/L (ref 20.0–28.0)
Bicarbonate: 26.6 mmol/L (ref 20.0–28.0)
O2 Saturation: 64 %
O2 Saturation: 66 %
TCO2: 27 mmol/L (ref 22–32)
TCO2: 28 mmol/L (ref 22–32)
pCO2, Ven: 45.7 mmHg (ref 44.0–60.0)
pCO2, Ven: 47.5 mmHg (ref 44.0–60.0)
pH, Ven: 7.354 (ref 7.250–7.430)
pH, Ven: 7.356 (ref 7.250–7.430)
pO2, Ven: 35 mmHg (ref 32.0–45.0)
pO2, Ven: 36 mmHg (ref 32.0–45.0)

## 2018-05-19 LAB — BASIC METABOLIC PANEL
Anion gap: 11 (ref 5–15)
BUN: 21 mg/dL (ref 8–23)
CO2: 27 mmol/L (ref 22–32)
Calcium: 9.5 mg/dL (ref 8.9–10.3)
Chloride: 105 mmol/L (ref 98–111)
Creatinine, Ser: 1.45 mg/dL — ABNORMAL HIGH (ref 0.44–1.00)
GFR calc Af Amer: 39 mL/min — ABNORMAL LOW (ref >60)
GFR calc non Af Amer: 33 mL/min — ABNORMAL LOW (ref >60)
Glucose, Bld: 100 mg/dL — ABNORMAL HIGH (ref 70–99)
Potassium: 4.4 mmol/L (ref 3.5–5.1)
Sodium: 143 mmol/L (ref 135–145)

## 2018-05-19 LAB — POCT I-STAT 3, ART BLOOD GAS (G3+)
Acid-Base Excess: 1 mmol/L (ref 0.0–2.0)
Bicarbonate: 25.7 mmol/L (ref 20.0–28.0)
O2 Saturation: 95 %
TCO2: 27 mmol/L (ref 22–32)
pCO2 arterial: 41.2 mmHg (ref 32.0–48.0)
pH, Arterial: 7.402 (ref 7.350–7.450)
pO2, Arterial: 76 mmHg — ABNORMAL LOW (ref 83.0–108.0)

## 2018-05-19 LAB — CBC
HCT: 37.3 % (ref 36.0–46.0)
Hemoglobin: 11.7 g/dL — ABNORMAL LOW (ref 12.0–15.0)
MCH: 29.8 pg (ref 26.0–34.0)
MCHC: 31.4 g/dL (ref 30.0–36.0)
MCV: 94.9 fL (ref 78.0–100.0)
Platelets: 155 10*3/uL (ref 150–400)
RBC: 3.93 MIL/uL (ref 3.87–5.11)
RDW: 13.7 % (ref 11.5–15.5)
WBC: 4.4 10*3/uL (ref 4.0–10.5)

## 2018-05-19 LAB — GLUCOSE, CAPILLARY: Glucose-Capillary: 84 mg/dL (ref 70–99)

## 2018-05-19 SURGERY — RIGHT/LEFT HEART CATH AND CORONARY ANGIOGRAPHY
Anesthesia: LOCAL

## 2018-05-19 MED ORDER — SODIUM CHLORIDE 0.9 % IV SOLN
INTRAVENOUS | Status: DC
  Administered 2018-05-19: 08:00:00 via INTRAVENOUS

## 2018-05-19 MED ORDER — HEPARIN SODIUM (PORCINE) 1000 UNIT/ML IJ SOLN
INTRAMUSCULAR | Status: AC
  Filled 2018-05-19: qty 1

## 2018-05-19 MED ORDER — SODIUM CHLORIDE 0.9 % IV SOLN: 250.0000 mL | INTRAVENOUS | Status: DC | PRN

## 2018-05-19 MED ORDER — SODIUM CHLORIDE 0.9% FLUSH: 3.0000 mL | INTRAVENOUS | Status: DC | PRN

## 2018-05-19 MED ORDER — LIDOCAINE HCL (PF) 1 % IJ SOLN
INTRAMUSCULAR | Status: AC
  Filled 2018-05-19: qty 30

## 2018-05-19 MED ORDER — METHYLPREDNISOLONE SODIUM SUCC 125 MG IJ SOLR
125.0000 mg | Freq: Once | INTRAMUSCULAR | Status: AC
  Administered 2018-05-19: 125 mg via INTRAVENOUS
  Filled 2018-05-19: qty 2

## 2018-05-19 MED ORDER — ONDANSETRON HCL 4 MG/2ML IJ SOLN: 4.0000 mg | Freq: Four times a day (QID) | INTRAMUSCULAR | Status: DC | PRN

## 2018-05-19 MED ORDER — HYDRALAZINE HCL 20 MG/ML IJ SOLN
INTRAMUSCULAR | Status: AC
  Filled 2018-05-19: qty 1

## 2018-05-19 MED ORDER — SODIUM CHLORIDE 0.9 % IV SOLN: INTRAVENOUS | Status: DC

## 2018-05-19 MED ORDER — ASPIRIN 81 MG PO CHEW: 81.0000 mg | CHEWABLE_TABLET | ORAL | Status: DC

## 2018-05-19 MED ORDER — SODIUM CHLORIDE 0.9% FLUSH: 3.0000 mL | Freq: Two times a day (BID) | INTRAVENOUS | Status: DC

## 2018-05-19 MED ORDER — IOHEXOL 350 MG/ML SOLN
INTRAVENOUS | Status: DC | PRN
  Administered 2018-05-19: 40 mL via INTRA_ARTERIAL

## 2018-05-19 MED ORDER — HYDRALAZINE HCL 20 MG/ML IJ SOLN
10.0000 mg | Freq: Once | INTRAMUSCULAR | Status: AC
  Administered 2018-05-19: 10 mg via INTRAVENOUS

## 2018-05-19 MED ORDER — FENTANYL CITRATE (PF) 100 MCG/2ML IJ SOLN
INTRAMUSCULAR | Status: DC | PRN
  Administered 2018-05-19 (×2): 25 ug via INTRAVENOUS

## 2018-05-19 MED ORDER — HEPARIN (PORCINE) IN NACL 1000-0.9 UT/500ML-% IV SOLN
INTRAVENOUS | Status: AC
  Filled 2018-05-19: qty 1000

## 2018-05-19 MED ORDER — HEPARIN (PORCINE) IN NACL 1000-0.9 UT/500ML-% IV SOLN
INTRAVENOUS | Status: DC | PRN
  Administered 2018-05-19 (×2): 500 mL

## 2018-05-19 MED ORDER — VERAPAMIL HCL 2.5 MG/ML IV SOLN
INTRAVENOUS | Status: DC | PRN
  Administered 2018-05-19: 10 mL via INTRA_ARTERIAL

## 2018-05-19 MED ORDER — HEPARIN SODIUM (PORCINE) 1000 UNIT/ML IJ SOLN
INTRAMUSCULAR | Status: DC | PRN
  Administered 2018-05-19: 4500 [IU] via INTRAVENOUS

## 2018-05-19 MED ORDER — HYDRALAZINE HCL 20 MG/ML IJ SOLN
INTRAMUSCULAR | Status: DC | PRN
  Administered 2018-05-19 (×2): 10 mg via INTRAVENOUS

## 2018-05-19 MED ORDER — ACETAMINOPHEN 325 MG PO TABS: 650.0000 mg | ORAL_TABLET | ORAL | Status: DC | PRN

## 2018-05-19 MED ORDER — MIDAZOLAM HCL 2 MG/2ML IJ SOLN
INTRAMUSCULAR | Status: AC
  Filled 2018-05-19: qty 2

## 2018-05-19 MED ORDER — DIPHENHYDRAMINE HCL 50 MG/ML IJ SOLN
25.0000 mg | Freq: Once | INTRAMUSCULAR | Status: AC
  Administered 2018-05-19: 25 mg via INTRAVENOUS
  Filled 2018-05-19: qty 1

## 2018-05-19 MED ORDER — VERAPAMIL HCL 2.5 MG/ML IV SOLN
INTRAVENOUS | Status: AC
  Filled 2018-05-19: qty 2

## 2018-05-19 MED ORDER — MIDAZOLAM HCL 2 MG/2ML IJ SOLN
INTRAMUSCULAR | Status: DC | PRN
  Administered 2018-05-19: 1 mg via INTRAVENOUS

## 2018-05-19 MED ORDER — HYDRALAZINE HCL 20 MG/ML IJ SOLN
INTRAMUSCULAR | Status: AC
  Administered 2018-05-19: 10 mg via INTRAVENOUS
  Filled 2018-05-19: qty 1

## 2018-05-19 MED ORDER — FENTANYL CITRATE (PF) 100 MCG/2ML IJ SOLN
INTRAMUSCULAR | Status: AC
  Filled 2018-05-19: qty 2

## 2018-05-19 MED ORDER — LIDOCAINE HCL (PF) 1 % IJ SOLN
INTRAMUSCULAR | Status: DC | PRN
  Administered 2018-05-19 (×2): 2 mL

## 2018-05-19 SURGICAL SUPPLY — 11 items
CATH 5FR JL3.5 JR4 ANG PIG MP (CATHETERS) ×2 IMPLANT
CATH BALLN WEDGE 5F 110CM (CATHETERS) ×2 IMPLANT
CATH INFINITI 5 FR 3DRC (CATHETERS) ×2 IMPLANT
DEVICE RAD COMP TR BAND LRG (VASCULAR PRODUCTS) ×2 IMPLANT
GLIDESHEATH SLEND SS 6F .021 (SHEATH) ×2 IMPLANT
GUIDEWIRE INQWIRE 1.5J.035X260 (WIRE) ×1 IMPLANT
INQWIRE 1.5J .035X260CM (WIRE) ×2
KIT HEART LEFT (KITS) ×2 IMPLANT
PACK CARDIAC CATHETERIZATION (CUSTOM PROCEDURE TRAY) ×2 IMPLANT
SHEATH GLIDE SLENDER 4/5FR (SHEATH) ×2 IMPLANT
TRANSDUCER W/STOPCOCK (MISCELLANEOUS) ×2 IMPLANT

## 2018-05-19 NOTE — Progress Notes (Signed)
Right wrist TRB removed, gauze with tegaderm placed, arm board placed, site WNL

## 2018-05-19 NOTE — Progress Notes (Signed)
Pt BP elevated 601'U systolic.  Dr bensimhon notified.  Give hydralazine 10mg  iv now.  If SBP below 170 at recheck, d/c pt and instruct pt to take torsemide and blood pressure meds at home

## 2018-05-19 NOTE — Interval H&P Note (Signed)
History and Physical Interval Note:  05/19/2018 8:56 AM  [name]  has presented today for surgery, with the diagnosis of hf  The various methods of treatment have been discussed with the patient and family. After consideration of risks, benefits and other options for treatment, the patient has consented to  Procedure(s): RIGHT/LEFT HEART CATH AND CORONARY ANGIOGRAPHY (N/A) and possible coronary angioplasty as a surgical intervention .  The patient's history has been reviewed, patient examined, no change in status, stable for surgery.  I have reviewed the patient's chart and labs.  Questions were answered to the patient's satisfaction.     Daniel Bensimhon

## 2018-05-19 NOTE — Discharge Instructions (Signed)

## 2018-05-20 ENCOUNTER — Encounter (HOSPITAL_COMMUNITY): Payer: Self-pay | Admitting: Internal Medicine

## 2018-05-23 ENCOUNTER — Ambulatory Visit (HOSPITAL_COMMUNITY)
Admission: RE | Admit: 2018-05-23 | Discharge: 2018-05-23 | Disposition: A | Discharge status: Home / Self Care | Payer: Medicare Other | Source: Ambulatory Visit | Attending: Cardiology | Admitting: Cardiology

## 2018-05-23 DIAGNOSIS — I5032 Chronic diastolic (congestive) heart failure: Secondary | ICD-10-CM | POA: Diagnosis not present

## 2018-05-23 LAB — PULMONARY FUNCTION TEST
DL/VA % pred: 89 %
DL/VA: 4.18 ml/min/mmHg/L
DLCO cor % pred: 74 %
DLCO cor: 17.04 ml/min/mmHg
DLCO unc % pred: 70 %
DLCO unc: 16.08 ml/min/mmHg
FEF 25-75 Post: 1.54 L/sec
FEF 25-75 Pre: 1.42 L/sec
FEF2575-%Change-Post: 8 %
FEF2575-%Pred-Post: 111 %
FEF2575-%Pred-Pre: 102 %
FEV1-%Change-Post: 1 %
FEV1-%Pred-Post: 93 %
FEV1-%Pred-Pre: 91 %
FEV1-Post: 1.75 L
FEV1-Pre: 1.72 L
FEV1FVC-%Change-Post: 3 %
FEV1FVC-%Pred-Pre: 104 %
FEV6-%Change-Post: 0 %
FEV6-%Pred-Post: 91 %
FEV6-%Pred-Pre: 91 %
FEV6-Post: 2.18 L
FEV6-Pre: 2.19 L
FEV6FVC-%Change-Post: 0 %
FEV6FVC-%Pred-Post: 104 %
FEV6FVC-%Pred-Pre: 103 %
FVC-%Change-Post: -1 %
FVC-%Pred-Post: 87 %
FVC-%Pred-Pre: 88 %
FVC-Post: 2.2 L
FVC-Pre: 2.22 L
Post FEV1/FVC ratio: 80 %
Post FEV6/FVC ratio: 99 %
Pre FEV1/FVC ratio: 77 %
Pre FEV6/FVC Ratio: 99 %
RV % pred: 91 %
RV: 2.13 L
TLC % pred: 96 %
TLC: 4.71 L

## 2018-05-23 MED ORDER — ALBUTEROL SULFATE (2.5 MG/3ML) 0.083% IN NEBU
2.5000 mg | INHALATION_SOLUTION | Freq: Once | RESPIRATORY_TRACT | Status: AC
  Administered 2018-05-23: 2.5 mg via RESPIRATORY_TRACT

## 2018-05-30 ENCOUNTER — Encounter (HOSPITAL_COMMUNITY): Payer: Medicare Other | Admitting: Cardiology

## 2018-06-02 ENCOUNTER — Telehealth (HOSPITAL_COMMUNITY): Payer: Self-pay

## 2018-06-02 NOTE — Telephone Encounter (Signed)
Spoke with [place] about pt starting PT and OT. I faxed orders along with demographics and insurance information.

## 2018-06-12 ENCOUNTER — Ambulatory Visit (HOSPITAL_BASED_OUTPATIENT_CLINIC_OR_DEPARTMENT_OTHER): Payer: Medicare Other | Attending: Cardiology

## 2018-06-12 ENCOUNTER — Other Ambulatory Visit: Payer: Self-pay | Admitting: Internal Medicine

## 2018-06-16 ENCOUNTER — Other Ambulatory Visit: Payer: Self-pay | Admitting: Internal Medicine

## 2018-06-17 ENCOUNTER — Ambulatory Visit (HOSPITAL_BASED_OUTPATIENT_CLINIC_OR_DEPARTMENT_OTHER): Payer: Medicare Other | Attending: Cardiology | Admitting: Cardiology

## 2018-06-17 ENCOUNTER — Telehealth: Payer: Self-pay | Admitting: Cardiology

## 2018-06-17 ENCOUNTER — Ambulatory Visit (INDEPENDENT_AMBULATORY_CARE_PROVIDER_SITE_OTHER): Payer: Medicare Other | Admitting: *Deleted

## 2018-06-17 VITALS — Ht 63.5 in | Wt 225.0 lb

## 2018-06-17 DIAGNOSIS — G4733 Obstructive sleep apnea (adult) (pediatric): Secondary | ICD-10-CM

## 2018-06-17 DIAGNOSIS — I495 Sick sinus syndrome: Secondary | ICD-10-CM

## 2018-06-17 DIAGNOSIS — R0902 Hypoxemia: Secondary | ICD-10-CM | POA: Insufficient documentation

## 2018-06-17 DIAGNOSIS — R0683 Snoring: Secondary | ICD-10-CM

## 2018-06-17 DIAGNOSIS — I5032 Chronic diastolic (congestive) heart failure: Secondary | ICD-10-CM

## 2018-06-17 DIAGNOSIS — R001 Bradycardia, unspecified: Secondary | ICD-10-CM

## 2018-06-17 NOTE — Telephone Encounter (Signed)
Spoke with pt and reminded pt of remote transmission that is due today. Pt verbalized understanding.   

## 2018-06-18 ENCOUNTER — Telehealth: Payer: Self-pay | Admitting: *Deleted

## 2018-06-18 ENCOUNTER — Ambulatory Visit (HOSPITAL_COMMUNITY)
Admission: RE | Admit: 2018-06-18 | Discharge: 2018-06-18 | Disposition: A | Discharge status: Home / Self Care | Payer: Medicare Other | Source: Ambulatory Visit | Attending: Internal Medicine | Admitting: Internal Medicine

## 2018-06-18 VITALS — BP 161/68 | HR 78 | Wt 227.0 lb

## 2018-06-18 DIAGNOSIS — M48061 Spinal stenosis, lumbar region without neurogenic claudication: Secondary | ICD-10-CM | POA: Insufficient documentation

## 2018-06-18 DIAGNOSIS — I495 Sick sinus syndrome: Secondary | ICD-10-CM | POA: Diagnosis not present

## 2018-06-18 DIAGNOSIS — Z85038 Personal history of other malignant neoplasm of large intestine: Secondary | ICD-10-CM | POA: Insufficient documentation

## 2018-06-18 DIAGNOSIS — N184 Chronic kidney disease, stage 4 (severe): Secondary | ICD-10-CM | POA: Insufficient documentation

## 2018-06-18 DIAGNOSIS — K219 Gastro-esophageal reflux disease without esophagitis: Secondary | ICD-10-CM | POA: Diagnosis not present

## 2018-06-18 DIAGNOSIS — M797 Fibromyalgia: Secondary | ICD-10-CM | POA: Diagnosis not present

## 2018-06-18 DIAGNOSIS — E1122 Type 2 diabetes mellitus with diabetic chronic kidney disease: Secondary | ICD-10-CM | POA: Diagnosis not present

## 2018-06-18 DIAGNOSIS — N183 Chronic kidney disease, stage 3 unspecified: Secondary | ICD-10-CM

## 2018-06-18 DIAGNOSIS — Z950 Presence of cardiac pacemaker: Secondary | ICD-10-CM | POA: Insufficient documentation

## 2018-06-18 DIAGNOSIS — Z87891 Personal history of nicotine dependence: Secondary | ICD-10-CM | POA: Insufficient documentation

## 2018-06-18 DIAGNOSIS — I130 Hypertensive heart and chronic kidney disease with heart failure and stage 1 through stage 4 chronic kidney disease, or unspecified chronic kidney disease: Secondary | ICD-10-CM | POA: Diagnosis present

## 2018-06-18 DIAGNOSIS — Z794 Long term (current) use of insulin: Secondary | ICD-10-CM

## 2018-06-18 DIAGNOSIS — R0683 Snoring: Secondary | ICD-10-CM

## 2018-06-18 DIAGNOSIS — Z79899 Other long term (current) drug therapy: Secondary | ICD-10-CM | POA: Diagnosis not present

## 2018-06-18 DIAGNOSIS — G4733 Obstructive sleep apnea (adult) (pediatric): Secondary | ICD-10-CM

## 2018-06-18 DIAGNOSIS — E119 Type 2 diabetes mellitus without complications: Secondary | ICD-10-CM | POA: Diagnosis not present

## 2018-06-18 DIAGNOSIS — G894 Chronic pain syndrome: Secondary | ICD-10-CM | POA: Insufficient documentation

## 2018-06-18 DIAGNOSIS — E785 Hyperlipidemia, unspecified: Secondary | ICD-10-CM | POA: Diagnosis not present

## 2018-06-18 DIAGNOSIS — I5032 Chronic diastolic (congestive) heart failure: Secondary | ICD-10-CM | POA: Diagnosis present

## 2018-06-18 DIAGNOSIS — I10 Essential (primary) hypertension: Secondary | ICD-10-CM

## 2018-06-18 DIAGNOSIS — Z79891 Long term (current) use of opiate analgesic: Secondary | ICD-10-CM | POA: Diagnosis not present

## 2018-06-18 MED ORDER — HYDRALAZINE HCL 50 MG PO TABS: 50.0000 mg | ORAL_TABLET | Freq: Three times a day (TID) | ORAL | 2 refills | Status: DC

## 2018-06-18 MED ORDER — TORSEMIDE 20 MG PO TABS: ORAL_TABLET | ORAL | 3 refills | Status: DC

## 2018-06-18 NOTE — Progress Notes (Signed)
Remote pacemaker transmission.   

## 2018-06-18 NOTE — Procedures (Signed)
  Patient Name: [name], [name] Date:08/27/2017 06/17/2018 Gender: Female D.O.B: 05-09-1941 Age (years): 20 Referring Provider: Lillia Mountain NP Height (inches): 54 Interpreting Physician: Fransico Him MD, ABSM Weight (lbs): 225 RPSGT: Zadie Rhine BMI: 39 MRN: [id number] Neck Size: 16.00  CLINICAL INFORMATION  Sleep Study Type: NPSG  Indication for sleep study: Diabetes, Obesity, Snoring  Epworth Sleepiness Score: 9  SLEEP STUDY TECHNIQUE  As per the AASM Manual for the Scoring of Sleep and Associated Events v2.3 (April 2016) with a hypopnea requiring 4% desaturations. The channels recorded and monitored were frontal, central and occipital EEG, electrooculogram (EOG), submentalis EMG (chin), nasal and oral airflow, thoracic and abdominal wall motion, anterior tibialis EMG, snore microphone, electrocardiogram, and pulse oximetry.  MEDICATIONS  Medications self-administered by patient taken the night of the study : APRESOLINE, TYLENOL, FLEXERIL, FENOFIBRATE, GABAPENTIN, AVAPRO, LABETALOL, MIRAPEX, SIMVASTATIN, TRAMADOL  SLEEP ARCHITECTURE  The study was initiated at 10:24:32 PM and ended at 4:23:26 AM. Sleep onset time was 13.5 minutes and the sleep efficiency was 73.0%%. The total sleep time was 261.9 minutes. Stage REM latency was N/A minutes. The patient spent 7.6%% of the night in stage N1 sleep, 82.8%% in stage N2 sleep, 9.5%% in stage N3 and 0% in REM. Alpha intrusion was absent. Supine sleep was 72.70%.  RESPIRATORY PARAMETERS  The overall apnea/hypopnea index (AHI) was 11.2 per hour. There were 2 total apneas, including 2 obstructive, 0 central and 0 mixed apneas. There were 47 hypopneas and 15 RERAs. The AHI during Stage REM sleep was N/A per hour. AHI while supine was 13.5 per hour. The mean oxygen saturation was 89.8%. The minimum SpO2 during sleep was 85.0%. moderate snoring was noted during this study.  CARDIAC DATA  The 2 lead EKG demonstrated sinus rhythm,  pacemaker generated. The mean heart rate was 61.1 beats per minute. Other EKG findings include: None.  LEG MOVEMENT DATA  The total PLMS were 0 with a resulting PLMS index of 0.0. Associated arousal with leg movement index was 0.0 .  IMPRESSIONS   - Mild obstructive sleep apnea occurred during this study (AHI = 11.2/h). - No significant central sleep apnea occurred during this study (CAI = 0.0/h). - Mild oxygen desaturation was noted during this study (Min O2 = 85.0%). - The patient snored with moderate snoring volume. - No cardiac abnormalities were noted during this study. - Clinically significant periodic limb movements did not occur during sleep. No significant associated arousals.  DIAGNOSIS  - Obstructive Sleep Apnea (327.23 [G47.33 ICD-10]) - Nocturnal Hypoxemia (327.26 [G47.36 ICD-10])  RECOMMENDATIONS  - Therapeutic CPAP titration to determine optimal pressure required to alleviate sleep disordered breathing. - Avoid alcohol, sedatives and other CNS depressants that may worsen sleep apnea and disrupt normal sleep architecture. - Sleep hygiene should be reviewed to assess factors that may improve sleep quality. - Weight management and regular exercise should be initiated or continued if appropriate.  [Electronically signed] 06/18/2018 01:36 PM  Fransico Him MD, ABSM Diplomate, American Board of Sleep Medicine

## 2018-06-18 NOTE — Telephone Encounter (Signed)
-----   Message from Sueanne Margarita, MD sent at 06/18/2018  1:39 PM EDT ----- Please let patient know that they have sleep apnea and recommend CPAP titration. Please set up titration in the sleep lab.

## 2018-06-18 NOTE — Patient Instructions (Addendum)
Increase Hydralazine to 50 mg Three times a day   Increase Torsemide to 40 mg (2 tabs) daily ofr 2-3 days then take 40 mg every other day ALTERNATING with 20 mg every other day   Your physician recommends that you schedule a follow-up appointment in: 1-2 months

## 2018-06-18 NOTE — Progress Notes (Signed)
ADVANCED HF CLINIC CONSULT NOTE  Primary Care: Pomposini, [name], MD EP: Dr Caryl Comes HF: Dr Haroldine Laws  HPI:  [name] is a 77 y.o. female with history of diastolic heart failure, DM, GERD, colon cancer, CKD stage III, 2016 pacemaker (MDT) due to symptomatic sinus node dysfunction.  In July 2019, she had 12 pound weight and she was sent to the ED by her PCP. She was sent home on metolazone every other day for 14 days. She took 4 doses and her creatinine went up to 3 so metolazone was stopped.   She presents today for follow up. She had R/LHC, PFTs, and sleep study since last visit. Her hydralazine was increased last visit. Creatinine was elevated, so torsemide was held for 2 days prior to cath and then resumed at lower dose. Overall doing okay. Not keeping volume off with lower torsemide dose. She remains SOB with chores around the house. She has BLE edema. + orthopnea. + bendopnea. She had sleep study last night and says she cannot tolerate her mask. No CP or dizziness. Weights ~220 lbs at home (up 5 lbs from last month). Taking all medications. Tried to limit salt intake but otherwise not watching her diet. Weight up 6 lbs on our scale. Scheduled for outpatient PT.   Studies: PFTs 05/23/18: FEV1: 1.72 FEV1/FVC ratio: 77% DLCO: 70%  R/LHC 05/19/18: Ao = 192/74 (118) LV = 201/26 RA = 14 RV = 53/18 PA = 53/19 (38) PCW = 27 (v = 41) Fick cardiac output/index = 5.6/2.8 PVR = 2.0 WU Ao sat = 95% PA sat = 64%, 66% Assessment: 1. Normal coronaries with ectactic vessels suggestive of longstanding HTN 2. Severe HTN 3. Normal LV function 4. Signficantly elevated filling pressures with pulmonary venous HTN in setting of holding diuretics for 2 days Plan/Discussion: Filling pressures and BP elevated. Will resume diuretics. Will need aggressive titration of ant-HTN regimen.  Echo 05/14/18: - Left ventricle: Posterior basal and inferolateral hypokinesis The   cavity size was moderately  dilated. Wall thickness was increased   in a pattern of mild LVH. Systolic function was normal. The   estimated ejection fraction was in the range of 50% to 55%. Left   ventricular diastolic function parameters were normal. - Aortic valve: Sclerosis without stenosis. - Mitral valve: Moderately calcified annulus. Moderately thickened,   mildly calcified leaflets . - Left atrium: The atrium was mildly dilated. - Atrial septum: No defect or patent foramen ovale was identified.   06/21/2015 Myoview   Nuclear stress EF: 64%.  There was no ST segment deviation noted during stress.  The study is normal.  This is a low risk study. No ischemia identified.  SH: Former 30-40 pack year smoker, quit 25 years ago.   Review of systems complete and found to be negative unless listed in HPI.   Past Medical History:  Diagnosis Date  . Anxiety   . Arthritis   . Cancer ([place])    colon  . Chronic kidney disease    stage 3  . Chronic pain syndrome   . Diabetes ([place])   . Diabetic neuropathy ([place])   . Dyslipidemia   . Early cataracts, bilateral   . Fibromyalgia   . GERD (gastroesophageal reflux disease)   . H/O syncope   . Heart murmur   . Hypertension   . Lumbar spinal stenosis    and scoliosis  . Pneumonia   . PONV (postoperative nausea and vomiting)   . Presence of permanent cardiac  pacemaker   . Restless legs   . Spinal headache   . Thyroid nodule     Current Outpatient Medications  Medication Sig Dispense Refill  . acetaminophen (TYLENOL) 650 MG CR tablet Take 1,300 mg by mouth every 8 (eight) hours.     . Cholecalciferol (VITAMIN D) 2000 UNITS tablet Take 2,000 Units by mouth daily.    . cyclobenzaprine (FLEXERIL) 10 MG tablet Take 10 mg by mouth 3 (three) times daily as needed for muscle spasms.    Marland Kitchen doxylamine, Sleep, (UNISOM) 25 MG tablet Take 25 mg by mouth at bedtime.    . DULoxetine (CYMBALTA) 20 MG capsule Take 20 mg by mouth daily.    . fenofibrate 160 MG tablet Take  160 mg by mouth daily.    Marland Kitchen gabapentin (NEURONTIN) 800 MG tablet Take 800 mg by mouth 3 (three) times daily.    . hydrALAZINE (APRESOLINE) 25 MG tablet Take 1 tablet (25 mg total) by mouth 3 (three) times daily. 90 tablet 5  . irbesartan (AVAPRO) 300 MG tablet Take 300 mg by mouth daily.  3  . labetalol (NORMODYNE) 200 MG tablet TAKE 1 TABLET(200 MG) BY MOUTH TWICE DAILY (Patient taking differently: Take 200 mg by mouth 2 (two) times daily. ) 180 tablet 2  . omeprazole (PRILOSEC OTC) 20 MG tablet Take 20 mg by mouth daily.    . pramipexole (MIRAPEX) 0.125 MG tablet Take 0.25 mg by mouth at bedtime as needed (for restless leg).     . QUININE SULFATE PO Take 260 mg by mouth at bedtime.    . simvastatin (ZOCOR) 40 MG tablet Take 40 mg by mouth daily.    . sitaGLIPtin (JANUVIA) 50 MG tablet Take 50 mg by mouth daily.     Marland Kitchen torsemide (DEMADEX) 20 MG tablet Take 1 tablet (20 mg total) by mouth daily. 90 tablet 3  . traMADol (ULTRAM) 50 MG tablet Take 50 mg by mouth 3 (three) times daily as needed for moderate pain.     . TRESIBA FLEXTOUCH 200 UNIT/ML SOPN Inject 100 Units into the skin every morning.  5   No current facility-administered medications for this encounter.     Allergies  Allergen Reactions  . Penicillins Diarrhea and Other (See Comments)    Severe diarrhea Has patient had a PCN reaction causing immediate rash, facial/tongue/throat swelling, SOB or lightheadedness with hypotension: No Has patient had a PCN reaction causing severe rash involving mucus membranes or skin necrosis: No Has patient had a PCN reaction that required hospitalization No Has patient had a PCN reaction occurring within the last 10 years: No If all of the above answers are "NO", then may proceed with Cephalosporin use.   . Codeine Other (See Comments)    Unknown  . Adhesive [Tape] Rash  . Aspirin Other (See Comments)    Stomach burning  . Iodine Itching, Swelling, Rash and Other (See Comments)    Can use if  Benadryl and Prednisone are used first   . Ivp Dye [Iodinated Diagnostic Agents] Other (See Comments)    Can use if Benadryl and Prednisone are used first    . Sulfa Antibiotics Diarrhea      Social History   Socioeconomic History  . Marital status: Widowed    Spouse name: Not on file  . Number of children: Not on file  . Years of education: Not on file  . Highest education level: Not on file  Occupational History  . Not on file  Social Needs  . Financial resource strain: Not on file  . Food insecurity:    Worry: Not on file    Inability: Not on file  . Transportation needs:    Medical: Not on file    Non-medical: Not on file  Tobacco Use  . Smoking status: Former Smoker    Start date: 09/10/1953    Last attempt to quit: 09/11/1983    Years since quitting: 34.7  . Smokeless tobacco: Never Used  Substance and Sexual Activity  . Alcohol use: Yes    Alcohol/week: 0.0 standard drinks    Comment: rare glass of wine  . Drug use: No  . Sexual activity: Not on file  Lifestyle  . Physical activity:    Days per week: Not on file    Minutes per session: Not on file  . Stress: Not on file  Relationships  . Social connections:    Talks on phone: Not on file    Gets together: Not on file    Attends religious service: Not on file    Active member of club or organization: Not on file    Attends meetings of clubs or organizations: Not on file    Relationship status: Not on file  . Intimate partner violence:    Fear of current or ex partner: Not on file    Emotionally abused: Not on file    Physically abused: Not on file    Forced sexual activity: Not on file  Other Topics Concern  . Not on file  Social History Narrative  . Not on file      Family History  Problem Relation Age of Onset  . CAD Unknown   . Sick sinus syndrome Brother        has a PPM    Vitals:   06/18/18 1431  BP: (!) 161/68  Pulse: 78  SpO2: 97%  Weight: 103 kg (227 lb)   Filed Weights    06/18/18 1431  Weight: 103 kg (227 lb)     Wt Readings from Last 3 Encounters:  06/18/18 103 kg (227 lb)  06/17/18 102.1 kg (225 lb)  05/19/18 97.5 kg (215 lb)     PHYSICAL EXAM: General: Elderly. No resp difficulty. Obese HEENT: Normal Anicteric Neck: Supple. JVP 8-10. Carotids 2+ bilat; no bruits. No thyromegaly or nodule noted. Cor: PMI nondisplaced. RRR, No M/G/R noted Lungs: CTAB, normal effort. No wheeze  Abdomen: Obese Soft, non-tender, non-distended, no HSM. No bruits or masses. +BS  Extremities: No cyanosis, clubbing, or rash. R and LLE 1-2+ edema.  Neuro: Alert & orientedx3, cranial nerves grossly intact. moves all 4 extremities w/o difficulty. Affect pleasant   ASSESSMENT & PLAN:  1. Chronic Diastolic Heart Failure  Echo 05/14/18: EF 50-55%, posterior basal and inferolateral HK, mild LVH, LA mildly dilated - R/LHC 05/19/18: normal coronaries, elevated filling pressure (had held diuretics x2 days), severe HTN. - NYHA III-IIIb - Volume status elevated - Take torsemide 40 mg daily for 2-3 days, then take torsemide 40 mg daily alternating with 20 mg daily. - Hold off on spiro with recent AKI - Discussed low salt food choices, and daily weights, and limiting fluid intake to < 2 liters per day.   2. Dyspnea - LHC 05/19/18 with normal coronaries - Sleep study with mild OSA. Dr Radford Pax recommending CPAP titration. - PFTs with DLCO 70%  3. Mild OSA  - Had sleep study and has mild OSA. Dr Radford Pax is recommending CPAP titration.  4.  CKD Stage III-IV - Had labs at PCP office recently.  5. Obesity  Body mass index is 39.58 kg/m. - Discussed portion control.  - Encouraged her to be more active. Refer to PT in [place].   6. Sinus node dysfunction s/p MDT PPM - Follows with Dr Caryl Comes - Good HR variation with walking around clinic last visit 64 resting to 95 bpm walking  7. HTN - Elevated today - Continue labetolol 200 mg BID - Continue irbesartan 300 mg daily - Increase  hydralazine to 50  mg TID   8. DM - Consider jardiance. GFR lower with recent AKI, but baseline seems to be 30-45. Per PCP. Cost is an issue for her.  9. Deconditioning - She has bilateral knee pain and balance problems, which keep her from being more active - Refer to PT in [place]. She sees them next week.  Take torsemide 40 mg daily for 2-3 days, then take torsemide 40 mg daily alternating with 20 mg daily. Increase hydralazine to 50 mg TID Follow up 1-2 months  Georgiana Shore, NP 2:37 PM  Patient seen and examined with the above-signed Advanced Practice Provider and/or Housestaff. I personally reviewed laboratory data, imaging studies and relevant notes. I independently examined the patient and formulated the important aspects of the plan. I have edited the note to reflect any of my changes or salient points. I have personally discussed the plan with the patient and/or family.  Cath and PFT results reviewed with her. She has normal EF and normal coronaries with markedly elevated filling pressures. Volume status back up. Will increase torsemide 40 daily for 2 days then alternate 40 daily alternating with 20 daily.   Long discussion that her obesity, OSA and poorly controlled HTN are major issues here and that she needs to be more proactive in managing her health behaviors otherwise she will continue to feel poorly and have progressive CHF and renal failure. Discussed need to lose at least 25 pounds with low carb diet. Will increase hydralazine to 50 bid. Would consider switching Januvia to Jardiance given outcomes if GFR >= 30.   Glori Bickers, MD  4:01 PM

## 2018-06-19 NOTE — Telephone Encounter (Signed)
Informed patient of sleep study results and patient understanding was verbalized. Patient understands her sleep study showed they have sleep apnea and recommend CPAP titration. Pt is aware and agreeable to these results but she explains that she doe not think she can sleep with anything on her face to sleep. Patient says she wants to think about the results and call us back with her answer at a later date. No order was placed for the cpap titration today.

## 2018-06-25 ENCOUNTER — Encounter: Payer: Self-pay | Admitting: Cardiology

## 2018-07-14 LAB — CUP PACEART REMOTE DEVICE CHECK
Battery Remaining Longevity: 89 mo
Battery Voltage: 3.01 V
Brady Statistic AP VP Percent: 0.47 %
Brady Statistic AP VS Percent: 97.83 %
Brady Statistic AS VP Percent: 0 %
Brady Statistic AS VS Percent: 1.69 %
Brady Statistic RA Percent Paced: 97.65 %
Brady Statistic RV Percent Paced: 0.55 %
Date Time Interrogation Session: 20191008201056
Implantable Lead Implant Date: 20161110
Implantable Lead Implant Date: 20161110
Implantable Lead Location: 753859
Implantable Lead Location: 753860
Implantable Lead Model: 5076
Implantable Lead Model: 5076
Implantable Pulse Generator Implant Date: 20161110
Lead Channel Impedance Value: 361 Ohm
Lead Channel Impedance Value: 399 Ohm
Lead Channel Impedance Value: 475 Ohm
Lead Channel Impedance Value: 513 Ohm
Lead Channel Pacing Threshold Amplitude: 0.5 V
Lead Channel Pacing Threshold Amplitude: 1 V
Lead Channel Pacing Threshold Pulse Width: 0.4 ms
Lead Channel Pacing Threshold Pulse Width: 0.4 ms
Lead Channel Sensing Intrinsic Amplitude: 2.875 mV
Lead Channel Sensing Intrinsic Amplitude: 2.875 mV
Lead Channel Sensing Intrinsic Amplitude: 9.125 mV
Lead Channel Sensing Intrinsic Amplitude: 9.125 mV
Lead Channel Setting Pacing Amplitude: 1.5 V
Lead Channel Setting Pacing Amplitude: 2 V
Lead Channel Setting Pacing Pulse Width: 0.4 ms
Lead Channel Setting Sensing Sensitivity: 2.8 mV

## 2018-08-11 ENCOUNTER — Encounter (HOSPITAL_COMMUNITY): Payer: Medicare Other | Admitting: Internal Medicine

## 2018-09-16 ENCOUNTER — Ambulatory Visit (INDEPENDENT_AMBULATORY_CARE_PROVIDER_SITE_OTHER): Payer: Medicare PPO

## 2018-09-16 DIAGNOSIS — I495 Sick sinus syndrome: Secondary | ICD-10-CM

## 2018-09-17 ENCOUNTER — Telehealth: Payer: Self-pay | Admitting: Internal Medicine

## 2018-09-17 NOTE — Telephone Encounter (Signed)
°  1. Has your device fired? no ° °2. Is you device beeping? no ° °3. Are you experiencing draining or swelling at device site?no ° °4. Are you calling to see if we received your device transmission?yes ° °5. Have you passed out? no ° ° ° °Please route to Device Clinic Pool °

## 2018-09-17 NOTE — Telephone Encounter (Signed)
Spoke w/ pt and informed her that we did not receive her remote transmission. She stated that she is going to send it now.

## 2018-09-18 LAB — CUP PACEART REMOTE DEVICE CHECK
Battery Remaining Longevity: 88 mo
Battery Voltage: 3.01 V
Brady Statistic AP VP Percent: 0.57 %
Brady Statistic AP VS Percent: 97.1 %
Brady Statistic AS VP Percent: 0 %
Brady Statistic AS VS Percent: 2.33 %
Brady Statistic RA Percent Paced: 96.6 %
Brady Statistic RV Percent Paced: 0.6 %
Date Time Interrogation Session: 20200108225102
Implantable Lead Implant Date: 20161110
Implantable Lead Implant Date: 20161110
Implantable Lead Location: 753859
Implantable Lead Location: 753860
Implantable Lead Model: 5076
Implantable Lead Model: 5076
Implantable Pulse Generator Implant Date: 20161110
Lead Channel Impedance Value: 361 Ohm
Lead Channel Impedance Value: 399 Ohm
Lead Channel Impedance Value: 437 Ohm
Lead Channel Impedance Value: 475 Ohm
Lead Channel Pacing Threshold Amplitude: 0.5 V
Lead Channel Pacing Threshold Amplitude: 0.875 V
Lead Channel Pacing Threshold Pulse Width: 0.4 ms
Lead Channel Pacing Threshold Pulse Width: 0.4 ms
Lead Channel Sensing Intrinsic Amplitude: 3.125 mV
Lead Channel Sensing Intrinsic Amplitude: 3.125 mV
Lead Channel Sensing Intrinsic Amplitude: 9.125 mV
Lead Channel Sensing Intrinsic Amplitude: 9.125 mV
Lead Channel Setting Pacing Amplitude: 1.5 V
Lead Channel Setting Pacing Amplitude: 2 V
Lead Channel Setting Pacing Pulse Width: 0.4 ms
Lead Channel Setting Sensing Sensitivity: 2.8 mV

## 2018-09-18 NOTE — Progress Notes (Signed)
Remote pacemaker transmission.   

## 2018-09-26 ENCOUNTER — Encounter (HOSPITAL_COMMUNITY): Payer: Medicare Other | Admitting: Internal Medicine

## 2018-10-10 ENCOUNTER — Encounter: Payer: Medicare Other | Admitting: Internal Medicine

## 2018-11-05 ENCOUNTER — Encounter: Payer: Self-pay | Admitting: Internal Medicine

## 2018-11-05 ENCOUNTER — Ambulatory Visit: Payer: Medicare PPO | Admitting: Internal Medicine

## 2018-11-05 VITALS — BP 138/70 | HR 86 | Ht 63.5 in | Wt 227.0 lb

## 2018-11-05 DIAGNOSIS — Z950 Presence of cardiac pacemaker: Secondary | ICD-10-CM | POA: Diagnosis not present

## 2018-11-05 DIAGNOSIS — I5022 Chronic systolic (congestive) heart failure: Secondary | ICD-10-CM | POA: Diagnosis not present

## 2018-11-05 DIAGNOSIS — I495 Sick sinus syndrome: Secondary | ICD-10-CM | POA: Diagnosis not present

## 2018-11-05 MED ORDER — TORSEMIDE 20 MG PO TABS: 40.0000 mg | ORAL_TABLET | Freq: Every day | ORAL | 3 refills | Status: DC

## 2018-11-05 NOTE — Patient Instructions (Signed)
Medication Instructions:  Your physician has recommended you make the following change in your medication:   1. Increase your Torsemide to 40mg , two tablet, once daily.  Labwork: You will have labs drawn today: BMP  Your physician recommends that you return for lab work in: BMP in two weeks at any [facility].  Testing/Procedures: None ordered.  Follow-Up: Your physician recommends that you schedule a follow-up appointment in:   6 months with Dr Caryl Comes  Any Other Special Instructions Will Be Listed Below (If Applicable).     If you need a refill on your cardiac medications before your next appointment, please call your pharmacy.

## 2018-11-05 NOTE — Progress Notes (Signed)
Patient Care Team: Pomposini, [name], MD as PCP - General (Internal Medicine)   HPI  [name] is a 78 y.o. female Seen in followup for pacemaker implanted 2016 for symptomatic sinus node dysfunction    No interval syncope.  Has struggled with worsening dyspnea.  And peripheral edema.  Worsening obesity.  Unable/willing to exercise.  Saw Dr. Reine [name].  Evaluation included catheterization echocardiogram and sleep studies as noted.  Efforts to augment diuresis were complicated by worsening creatinine requiring downward adjustment.  Diet has excessive   sodium and fluid    DATE TEST EF   9/19 Echo    50-55 % LVH mild  9/19 * LHC 55-65 Cors nonobstructive          Date Cr K  5/17 1.16 4.5  7/19 1.83   7/19 3.07   9/19  1.45 4.4       Past Medical History:  Diagnosis Date  . Anxiety   . Arthritis   . Cancer ([place])    colon  . Chronic kidney disease    stage 3  . Chronic pain syndrome   . Diabetes ([place])   . Diabetic neuropathy ([place])   . Dyslipidemia   . Early cataracts, bilateral   . Fibromyalgia   . GERD (gastroesophageal reflux disease)   . H/O syncope   . Heart murmur   . Hypertension   . Lumbar spinal stenosis    and scoliosis  . Pneumonia   . PONV (postoperative nausea and vomiting)   . Presence of permanent cardiac pacemaker   . Restless legs   . Spinal headache   . Thyroid nodule     Past Surgical History:  Procedure Laterality Date  . APPENDECTOMY    . BREAST SURGERY     lumpectomy  . COLON SURGERY    . DILATION AND CURETTAGE OF UTERUS    . EP IMPLANTABLE DEVICE N/A 07/21/2015   Procedure: Pacemaker Implant;  Surgeon: [name] Sprang, MD;  Location: [facility];  Service: Cardiovascular;  Laterality: N/A;  . LUMBAR LAMINECTOMY/DECOMPRESSION MICRODISCECTOMY N/A 02/02/2016   Procedure: DECOMPRESSION L4-L5 WITH INSITE 2 FUSION ;  Surgeon: [name] Schools, MD;  Location: [facility];  Service: Orthopedics;  Laterality: N/A;  . PARTIAL  NEPHRECTOMY    . RIGHT/LEFT HEART CATH AND CORONARY ANGIOGRAPHY N/A 05/19/2018   Procedure: RIGHT/LEFT HEART CATH AND CORONARY ANGIOGRAPHY;  Surgeon: [name] Artist, MD;  Location: [facility];  Service: Cardiovascular;  Laterality: N/A;  . TONSILLECTOMY    . TUBAL LIGATION      Current Outpatient Medications  Medication Sig Dispense Refill  . acetaminophen (TYLENOL) 650 MG CR tablet Take 1,300 mg by mouth every 8 (eight) hours.     . Cholecalciferol (VITAMIN D) 2000 UNITS tablet Take 2,000 Units by mouth daily.    . cyclobenzaprine (FLEXERIL) 10 MG tablet Take 10 mg by mouth 3 (three) times daily as needed for muscle spasms.    Marland Kitchen doxylamine, Sleep, (UNISOM) 25 MG tablet Take 25 mg by mouth at bedtime.    . DULoxetine (CYMBALTA) 20 MG capsule Take 20 mg by mouth daily.    Marland Kitchen gabapentin (NEURONTIN) 800 MG tablet Take 800 mg by mouth 3 (three) times daily.    . hydrALAZINE (APRESOLINE) 50 MG tablet Take 1 tablet (50 mg total) by mouth 3 (three) times daily. 270 tablet 2  . irbesartan (AVAPRO) 300 MG tablet Take 300 mg by mouth daily.  3  .  labetalol (NORMODYNE) 200 MG tablet TAKE 1 TABLET(200 MG) BY MOUTH TWICE DAILY 180 tablet 2  . omeprazole (PRILOSEC OTC) 20 MG tablet Take 20 mg by mouth daily.    . pramipexole (MIRAPEX) 0.125 MG tablet Take 0.25 mg by mouth at bedtime as needed (for restless leg).     . sitaGLIPtin (JANUVIA) 50 MG tablet Take 50 mg by mouth daily.     Marland Kitchen torsemide (DEMADEX) 20 MG tablet Take 2 tabs every other day ALTERNATING with 1 tab every other day 180 tablet 3  . traMADol (ULTRAM) 50 MG tablet Take 50 mg by mouth 3 (three) times daily as needed for moderate pain.     . TRESIBA FLEXTOUCH 200 UNIT/ML SOPN Inject 100 Units into the skin every morning.  5  . VASCEPA 1 g CAPS Take 2 capsules by mouth 2 (two) times daily.  3   No current facility-administered medications for this visit.     Allergies  Allergen Reactions  . Penicillins Diarrhea and Other (See  Comments)    Severe diarrhea Has patient had a PCN reaction causing immediate rash, facial/tongue/throat swelling, SOB or lightheadedness with hypotension: No Has patient had a PCN reaction causing severe rash involving mucus membranes or skin necrosis: No Has patient had a PCN reaction that required hospitalization No Has patient had a PCN reaction occurring within the last 10 years: No If all of the above answers are "NO", then may proceed with Cephalosporin use.   . Codeine Other (See Comments)    Unknown  . Adhesive [Tape] Rash  . Aspirin Other (See Comments)    Stomach burning  . Iodine Itching, Swelling, Rash and Other (See Comments)    Can use if Benadryl and Prednisone are used first   . Ivp Dye [Iodinated Diagnostic Agents] Other (See Comments)    Can use if Benadryl and Prednisone are used first    . Sulfa Antibiotics Diarrhea      Review of Systems negative except from HPI and PMH  Physical Exam BP 138/70   Pulse 86   Ht 5' 3.5" (1.613 m)   Wt 227 lb (103 kg)   SpO2 94%   BMI 39.58 kg/m  Well developed and Morbidly obese in no acute distress HENT normal Neck supple with JVP-10 Clear Regular rate and rhythm, no  gallop 2/6 murmur Abd-soft with active BS No Clubbing cyanosis 2+ edema Skin-warm and dry A & Oriented  Grossly normal sensory and motor function ssly normal sensory and motor function     ECG Apacing Assessment and  Plan  Sinus node dysfunction-symptomatic  Pacemaker-Medtronic  Obstructive sleep apnea  Dyspnea on exertion  Hypertension   Morbid obesity  HFpEF  Renal insufficiency 3-4    The patient remains volume overloaded.  She struggles with control of her diet and fluid intake.  We had a lengthy discussion regarding the importance of salt and water restriction.  Managing her fluid with diuretics is complicated by her renal issues.  We will check her creatinine today.  We will increase her torsemide to 40 daily.  She will need  a metabolic profile checked in about 10 days which she will get done through [facility].  Discussed importance of exercise and possibility of water aerobics.  She is [facility] about [facility] to be closer to her daughter [name] Buchanan-[employer] leader) More than 50% of 30 min was spent in counseling related to the above   Current medicines are reviewed at length with the  patient today .  The patient does not have concerns regarding medicines.

## 2018-11-06 LAB — CUP PACEART INCLINIC DEVICE CHECK
Date Time Interrogation Session: 20200227081250
Implantable Lead Implant Date: 20161110
Implantable Lead Implant Date: 20161110
Implantable Lead Location: 753859
Implantable Lead Location: 753860
Implantable Lead Model: 5076
Implantable Lead Model: 5076
Implantable Pulse Generator Implant Date: 20161110

## 2018-11-06 LAB — BASIC METABOLIC PANEL
BUN/Creatinine Ratio: 23 (ref 12–28)
BUN: 34 mg/dL — ABNORMAL HIGH (ref 8–27)
CO2: 26 mmol/L (ref 20–29)
Calcium: 9.6 mg/dL (ref 8.7–10.3)
Chloride: 103 mmol/L (ref 96–106)
Creatinine, Ser: 1.47 mg/dL — ABNORMAL HIGH (ref 0.57–1.00)
GFR calc Af Amer: 39 mL/min/{1.73_m2} — ABNORMAL LOW (ref >59)
GFR calc non Af Amer: 34 mL/min/{1.73_m2} — ABNORMAL LOW (ref >59)
Glucose: 86 mg/dL (ref 65–99)
Potassium: 4.5 mmol/L (ref 3.5–5.2)
Sodium: 145 mmol/L — ABNORMAL HIGH (ref 134–144)

## 2018-11-14 ENCOUNTER — Other Ambulatory Visit: Payer: Self-pay | Admitting: Internal Medicine

## 2018-11-21 LAB — BASIC METABOLIC PANEL
BUN/Creatinine Ratio: 21 (ref 12–28)
BUN: 36 mg/dL — ABNORMAL HIGH (ref 8–27)
CO2: 24 mmol/L (ref 20–29)
Calcium: 9.6 mg/dL (ref 8.7–10.3)
Chloride: 101 mmol/L (ref 96–106)
Creatinine, Ser: 1.69 mg/dL — ABNORMAL HIGH (ref 0.57–1.00)
GFR calc Af Amer: 33 mL/min/{1.73_m2} — ABNORMAL LOW (ref >59)
GFR calc non Af Amer: 28 mL/min/{1.73_m2} — ABNORMAL LOW (ref >59)
Glucose: 83 mg/dL (ref 65–99)
Potassium: 4.5 mmol/L (ref 3.5–5.2)
Sodium: 142 mmol/L (ref 134–144)

## 2018-12-16 ENCOUNTER — Other Ambulatory Visit: Payer: Self-pay

## 2018-12-16 ENCOUNTER — Telehealth: Payer: Self-pay

## 2018-12-16 ENCOUNTER — Ambulatory Visit (INDEPENDENT_AMBULATORY_CARE_PROVIDER_SITE_OTHER): Payer: Medicare PPO | Admitting: *Deleted

## 2018-12-16 DIAGNOSIS — I495 Sick sinus syndrome: Secondary | ICD-10-CM

## 2018-12-16 LAB — CUP PACEART REMOTE DEVICE CHECK
Battery Remaining Longevity: 87 mo
Battery Voltage: 3.01 V
Brady Statistic AP VP Percent: 0.41 %
Brady Statistic AP VS Percent: 97.23 %
Brady Statistic AS VP Percent: 0 %
Brady Statistic AS VS Percent: 2.36 %
Brady Statistic RA Percent Paced: 97.27 %
Brady Statistic RV Percent Paced: 0.42 %
Date Time Interrogation Session: 20200407171845
Implantable Lead Implant Date: 20161110
Implantable Lead Implant Date: 20161110
Implantable Lead Location: 753859
Implantable Lead Location: 753860
Implantable Lead Model: 5076
Implantable Lead Model: 5076
Implantable Pulse Generator Implant Date: 20161110
Lead Channel Impedance Value: 380 Ohm
Lead Channel Impedance Value: 399 Ohm
Lead Channel Impedance Value: 456 Ohm
Lead Channel Impedance Value: 494 Ohm
Lead Channel Pacing Threshold Amplitude: 0.5 V
Lead Channel Pacing Threshold Amplitude: 1 V
Lead Channel Pacing Threshold Pulse Width: 0.4 ms
Lead Channel Pacing Threshold Pulse Width: 0.4 ms
Lead Channel Sensing Intrinsic Amplitude: 3.625 mV
Lead Channel Sensing Intrinsic Amplitude: 3.625 mV
Lead Channel Sensing Intrinsic Amplitude: 8.625 mV
Lead Channel Sensing Intrinsic Amplitude: 8.625 mV
Lead Channel Setting Pacing Amplitude: 1.5 V
Lead Channel Setting Pacing Amplitude: 2 V
Lead Channel Setting Pacing Pulse Width: 0.4 ms
Lead Channel Setting Sensing Sensitivity: 2.8 mV

## 2018-12-16 NOTE — Telephone Encounter (Signed)
Spoke with patient to remind of missed remote transmission 

## 2018-12-25 NOTE — Progress Notes (Signed)
Remote pacemaker transmission.   

## 2019-03-17 ENCOUNTER — Ambulatory Visit (INDEPENDENT_AMBULATORY_CARE_PROVIDER_SITE_OTHER): Payer: Medicare PPO | Admitting: *Deleted

## 2019-03-17 DIAGNOSIS — I495 Sick sinus syndrome: Secondary | ICD-10-CM | POA: Diagnosis not present

## 2019-03-18 ENCOUNTER — Telehealth: Payer: Self-pay

## 2019-03-18 LAB — CUP PACEART REMOTE DEVICE CHECK
Battery Remaining Longevity: 87 mo
Battery Voltage: 3.01 V
Brady Statistic AP VP Percent: 0.45 %
Brady Statistic AP VS Percent: 97.73 %
Brady Statistic AS VP Percent: 0 %
Brady Statistic AS VS Percent: 1.82 %
Brady Statistic RA Percent Paced: 97.34 %
Brady Statistic RV Percent Paced: 0.47 %
Date Time Interrogation Session: 20200708170656
Implantable Lead Implant Date: 20161110
Implantable Lead Implant Date: 20161110
Implantable Lead Location: 753859
Implantable Lead Location: 753860
Implantable Lead Model: 5076
Implantable Lead Model: 5076
Implantable Pulse Generator Implant Date: 20161110
Lead Channel Impedance Value: 380 Ohm
Lead Channel Impedance Value: 399 Ohm
Lead Channel Impedance Value: 456 Ohm
Lead Channel Impedance Value: 494 Ohm
Lead Channel Pacing Threshold Amplitude: 0.625 V
Lead Channel Pacing Threshold Amplitude: 1 V
Lead Channel Pacing Threshold Pulse Width: 0.4 ms
Lead Channel Pacing Threshold Pulse Width: 0.4 ms
Lead Channel Sensing Intrinsic Amplitude: 3.625 mV
Lead Channel Sensing Intrinsic Amplitude: 3.625 mV
Lead Channel Sensing Intrinsic Amplitude: 9.875 mV
Lead Channel Sensing Intrinsic Amplitude: 9.875 mV
Lead Channel Setting Pacing Amplitude: 1.5 V
Lead Channel Setting Pacing Amplitude: 2 V
Lead Channel Setting Pacing Pulse Width: 0.4 ms
Lead Channel Setting Sensing Sensitivity: 2.8 mV

## 2019-03-18 NOTE — Telephone Encounter (Signed)
Left message for patient to remind of missed remote transmission.  

## 2019-03-28 ENCOUNTER — Encounter: Payer: Self-pay | Admitting: Cardiology

## 2019-03-28 NOTE — Progress Notes (Signed)
Remote pacemaker transmission.   

## 2019-05-04 ENCOUNTER — Ambulatory Visit (INDEPENDENT_AMBULATORY_CARE_PROVIDER_SITE_OTHER): Payer: Medicare PPO | Admitting: Internal Medicine

## 2019-05-04 ENCOUNTER — Encounter (INDEPENDENT_AMBULATORY_CARE_PROVIDER_SITE_OTHER): Payer: Self-pay

## 2019-05-04 ENCOUNTER — Telehealth: Payer: Self-pay | Admitting: Internal Medicine

## 2019-05-04 ENCOUNTER — Encounter: Payer: Self-pay | Admitting: Internal Medicine

## 2019-05-04 ENCOUNTER — Other Ambulatory Visit: Payer: Self-pay

## 2019-05-04 VITALS — BP 136/80 | HR 71 | Ht 63.5 in | Wt 219.0 lb

## 2019-05-04 DIAGNOSIS — I495 Sick sinus syndrome: Secondary | ICD-10-CM

## 2019-05-04 DIAGNOSIS — I5022 Chronic systolic (congestive) heart failure: Secondary | ICD-10-CM | POA: Diagnosis not present

## 2019-05-04 DIAGNOSIS — Z950 Presence of cardiac pacemaker: Secondary | ICD-10-CM | POA: Diagnosis not present

## 2019-05-04 LAB — CUP PACEART INCLINIC DEVICE CHECK
Battery Remaining Longevity: 87 mo
Battery Voltage: 3.01 V
Brady Statistic AP VP Percent: 0.56 %
Brady Statistic AP VS Percent: 97.34 %
Brady Statistic AS VP Percent: 0 %
Brady Statistic AS VS Percent: 2.1 %
Brady Statistic RA Percent Paced: 96.28 %
Brady Statistic RV Percent Paced: 0.59 %
Date Time Interrogation Session: 20200824151201
Implantable Lead Implant Date: 20161110
Implantable Lead Implant Date: 20161110
Implantable Lead Location: 753859
Implantable Lead Location: 753860
Implantable Lead Model: 5076
Implantable Lead Model: 5076
Implantable Pulse Generator Implant Date: 20161110
Lead Channel Impedance Value: 380 Ohm
Lead Channel Impedance Value: 418 Ohm
Lead Channel Impedance Value: 494 Ohm
Lead Channel Impedance Value: 551 Ohm
Lead Channel Pacing Threshold Amplitude: 0.5 V
Lead Channel Pacing Threshold Amplitude: 1 V
Lead Channel Pacing Threshold Pulse Width: 0.4 ms
Lead Channel Pacing Threshold Pulse Width: 0.4 ms
Lead Channel Sensing Intrinsic Amplitude: 3.25 mV
Lead Channel Sensing Intrinsic Amplitude: 4.5 mV
Lead Channel Sensing Intrinsic Amplitude: 8.5 mV
Lead Channel Sensing Intrinsic Amplitude: 9.5 mV
Lead Channel Setting Pacing Amplitude: 1.5 V
Lead Channel Setting Pacing Amplitude: 2 V
Lead Channel Setting Pacing Pulse Width: 0.4 ms
Lead Channel Setting Sensing Sensitivity: 2.8 mV

## 2019-05-04 NOTE — Telephone Encounter (Signed)
Pt currently in clinic

## 2019-05-04 NOTE — Patient Instructions (Addendum)
Medication Instructions:  Your physician has recommended you make the following change in your medication:   Today- take another 40mg , 2 tablets, of torsemide Tues- take normal dose of 40mg , 2 tablets, of torsemide Wed- take 2 tablets, 40mg  in the morning, then 2 tablets in the afternoon Thur- take normal dose, 2 tablets 40mg , of torsemide Fri- take 2 tablets, 40mg  in the morning, then 2 tablets in the afternoon Sat- take normal dose, 2 tablets 40mg , of torsemide Sun- take 2 tablets, 40mg  in the morning, then 2 tablets in the afternoon  Then begin your normal dose of Torsemide, 2 tablet, 40mg , once daily.   Labwork: You will have labs drawn today: BMP   Testing/Procedures: None ordered.  Follow-Up: Your physician recommends that you schedule a follow-up appointment in:   4-5 weeks with Dr. Haroldine Laws  6 mo with Dr. Caryl Comes  Any Other Special Instructions Will Be Listed Below (If Applicable).     If you need a refill on your cardiac medications before your next appointment, please call your pharmacy.

## 2019-05-04 NOTE — Telephone Encounter (Signed)
New Message   Patient's daughter is calling in to see if she can get clearance to accompany the patient in her appointment today 05/04/19 at 2:00 pm with Dr. Caryl Comes. Please give patient's daughter a call back to confirm.

## 2019-05-04 NOTE — Progress Notes (Signed)
Patient Care Team: Pomposini, [name], MD as PCP - General (Internal Medicine)   HPI  [name] is a 78 y.o. female Seen in followup for pacemaker implanted 2016 for symptomatic sinus node dysfunction    Saw Dr. Reine Just.  Evaluation included catheterization echocardiogram and sleep studies as noted.  Efforts to augment diuresis were complicated by worsening creatinine  Still complaining of significant SOB and edema  Got sick a few months ago with resolution of edema and abd distension but has reaccumulated    DATE TEST EF   9/19 Echo    50-55 % LVH mild  9/19 * LHC 55-65 Cors nonobstructive          Date Cr K Hgb  5/17 1.16 4.5   7/19 1.83    7/19 3.07    9/19  1.45 4.4 11.7  3/20 1.69 4.5             Past Medical History:  Diagnosis Date  . Anxiety   . Arthritis   . Cancer ([place])    colon  . Chronic kidney disease    stage 3  . Chronic pain syndrome   . Diabetes ([place])   . Diabetic neuropathy ([place])   . Dyslipidemia   . Early cataracts, bilateral   . Fibromyalgia   . GERD (gastroesophageal reflux disease)   . H/O syncope   . Heart murmur   . Hypertension   . Lumbar spinal stenosis    and scoliosis  . Pneumonia   . PONV (postoperative nausea and vomiting)   . Presence of permanent cardiac pacemaker   . Restless legs   . Spinal headache   . Thyroid nodule     Past Surgical History:  Procedure Laterality Date  . APPENDECTOMY    . BREAST SURGERY     lumpectomy  . COLON SURGERY    . DILATION AND CURETTAGE OF UTERUS    . EP IMPLANTABLE DEVICE N/A 07/21/2015   Procedure: Pacemaker Implant;  Surgeon: Deboraha Sprang, MD;  Location: [facility];  Service: Cardiovascular;  Laterality: N/A;  . LUMBAR LAMINECTOMY/DECOMPRESSION MICRODISCECTOMY N/A 02/02/2016   Procedure: DECOMPRESSION L4-L5 WITH INSITE 2 FUSION ;  Surgeon: Melina Schools, MD;  Location: [facility];  Service: Orthopedics;  Laterality: N/A;  . PARTIAL NEPHRECTOMY    . RIGHT/LEFT  HEART CATH AND CORONARY ANGIOGRAPHY N/A 05/19/2018   Procedure: RIGHT/LEFT HEART CATH AND CORONARY ANGIOGRAPHY;  Surgeon: Jolaine Artist, MD;  Location: [facility];  Service: Cardiovascular;  Laterality: N/A;  . TONSILLECTOMY    . TUBAL LIGATION      Current Outpatient Medications  Medication Sig Dispense Refill  . acetaminophen (TYLENOL) 650 MG CR tablet Take 1,300 mg by mouth every 8 (eight) hours.     . Cholecalciferol (VITAMIN D) 2000 UNITS tablet Take 2,000 Units by mouth daily.    . cyclobenzaprine (FLEXERIL) 10 MG tablet Take 10 mg by mouth 3 (three) times daily as needed for muscle spasms.    Marland Kitchen doxylamine, Sleep, (UNISOM) 25 MG tablet Take 25 mg by mouth at bedtime.    . DULoxetine (CYMBALTA) 20 MG capsule Take 20 mg by mouth daily.    . hydrALAZINE (APRESOLINE) 50 MG tablet Take 1 tablet (50 mg total) by mouth 3 (three) times daily. 270 tablet 2  . irbesartan (AVAPRO) 300 MG tablet Take 300 mg by mouth daily.  3  . labetalol (NORMODYNE) 200 MG tablet TAKE 1 TABLET(200 MG)  BY MOUTH TWICE DAILY 180 tablet 3  . omeprazole (PRILOSEC OTC) 20 MG tablet Take 20 mg by mouth daily.    . pramipexole (MIRAPEX) 0.125 MG tablet Take 0.25 mg by mouth 2 (two) times daily.    . sitaGLIPtin (JANUVIA) 50 MG tablet Take 50 mg by mouth daily.     Marland Kitchen torsemide (DEMADEX) 20 MG tablet Take 2 tablets (40 mg total) by mouth daily. 180 tablet 3  . traMADol (ULTRAM) 50 MG tablet Take 50 mg by mouth 3 (three) times daily as needed for moderate pain.     . TRESIBA FLEXTOUCH 200 UNIT/ML SOPN Inject 100 Units into the skin every morning.  5   No current facility-administered medications for this visit.     Allergies  Allergen Reactions  . Penicillins Diarrhea and Other (See Comments)    Severe diarrhea Has patient had a PCN reaction causing immediate rash, facial/tongue/throat swelling, SOB or lightheadedness with hypotension: No Has patient had a PCN reaction causing severe rash involving mucus  membranes or skin necrosis: No Has patient had a PCN reaction that required hospitalization No Has patient had a PCN reaction occurring within the last 10 years: No If all of the above answers are "NO", then may proceed with Cephalosporin use.   . Codeine Other (See Comments)    Unknown  . Adhesive [Tape] Rash  . Aspirin Other (See Comments)    Stomach burning  . Iodine Itching, Swelling, Rash and Other (See Comments)    Can use if Benadryl and Prednisone are used first   . Ivp Dye [Iodinated Diagnostic Agents] Other (See Comments)    Can use if Benadryl and Prednisone are used first    . Sulfa Antibiotics Diarrhea      Review of Systems negative except from HPI and PMH  Physical Exam BP 136/80   Pulse 71   Ht 5' 3.5" (1.613 m)   Wt 219 lb (99.3 kg)   SpO2 96%   BMI 38.19 kg/m  Well developed and well nourished in no acute distress HENT normal Neck supple  Clear Device pocket well healed; without hematoma or erythema.  There is no tethering  Regular rate and rhythm, no  murmur Abd-soft   No Clubbing cyanosis 2+ edema Skin-warm and dry A & Oriented  Grossly normal sensory and motor function  ECG Apacing @ 71 19/10/42   Sinus node dysfunction-symptomatic  Pacemaker-Medtronic  Obstructive sleep apnea  Dyspnea on exertion  Hypertension   Morbid obesity  HFpEF  Renal insufficiency 3-4  ( Cr 3.07 -1.65)      BP cuff seems to be off by about 40-50 mm>> needs new BP cuff prior to making decisions  Significant edema and made more complicated by renal insufficiency  Will check Cr today and increase her torsemide 40 bid x d then qod x 7d  Will arrange followup with CHF clinic

## 2019-05-05 LAB — BASIC METABOLIC PANEL
BUN/Creatinine Ratio: 26 (ref 12–28)
BUN: 37 mg/dL — ABNORMAL HIGH (ref 8–27)
CO2: 24 mmol/L (ref 20–29)
Calcium: 9.8 mg/dL (ref 8.7–10.3)
Chloride: 101 mmol/L (ref 96–106)
Creatinine, Ser: 1.44 mg/dL — ABNORMAL HIGH (ref 0.57–1.00)
GFR calc Af Amer: 40 mL/min/{1.73_m2} — ABNORMAL LOW (ref >59)
GFR calc non Af Amer: 34 mL/min/{1.73_m2} — ABNORMAL LOW (ref >59)
Glucose: 88 mg/dL (ref 65–99)
Potassium: 4.7 mmol/L (ref 3.5–5.2)
Sodium: 143 mmol/L (ref 134–144)

## 2019-05-20 ENCOUNTER — Telehealth: Payer: Self-pay | Admitting: Internal Medicine

## 2019-05-20 NOTE — Telephone Encounter (Signed)
New Message   1. Are you calling in reference to your FMLA or disability form? No  2. What is your question in regards to FMLA or disability form? No   3. Do you need copies of your medical records? Yes, faxed to [phone] (Medical records from appointments with Dr. Caryl Comes)  Attn: Adaline Sill  4. Are you waiting on a nurse to call you back with results or are you wanting copies of your results? No    Please route to Medical Records or your medical records site representative

## 2019-06-16 ENCOUNTER — Ambulatory Visit (INDEPENDENT_AMBULATORY_CARE_PROVIDER_SITE_OTHER): Payer: Medicare PPO | Admitting: *Deleted

## 2019-06-16 ENCOUNTER — Encounter (HOSPITAL_COMMUNITY): Payer: Medicare PPO | Admitting: Internal Medicine

## 2019-06-16 DIAGNOSIS — I495 Sick sinus syndrome: Secondary | ICD-10-CM

## 2019-06-17 LAB — CUP PACEART REMOTE DEVICE CHECK
Battery Remaining Longevity: 87 mo
Battery Voltage: 3.01 V
Brady Statistic AP VP Percent: 0.74 %
Brady Statistic AP VS Percent: 96.59 %
Brady Statistic AS VP Percent: 0 %
Brady Statistic AS VS Percent: 2.66 %
Brady Statistic RA Percent Paced: 95.52 %
Brady Statistic RV Percent Paced: 0.8 %
Date Time Interrogation Session: 20201006185923
Implantable Lead Implant Date: 20161110
Implantable Lead Implant Date: 20161110
Implantable Lead Location: 753859
Implantable Lead Location: 753860
Implantable Lead Model: 5076
Implantable Lead Model: 5076
Implantable Pulse Generator Implant Date: 20161110
Lead Channel Impedance Value: 380 Ohm
Lead Channel Impedance Value: 418 Ohm
Lead Channel Impedance Value: 456 Ohm
Lead Channel Impedance Value: 494 Ohm
Lead Channel Pacing Threshold Amplitude: 0.625 V
Lead Channel Pacing Threshold Amplitude: 1 V
Lead Channel Pacing Threshold Pulse Width: 0.4 ms
Lead Channel Pacing Threshold Pulse Width: 0.4 ms
Lead Channel Sensing Intrinsic Amplitude: 3.875 mV
Lead Channel Sensing Intrinsic Amplitude: 3.875 mV
Lead Channel Sensing Intrinsic Amplitude: 8.75 mV
Lead Channel Sensing Intrinsic Amplitude: 8.75 mV
Lead Channel Setting Pacing Amplitude: 1.5 V
Lead Channel Setting Pacing Amplitude: 2 V
Lead Channel Setting Pacing Pulse Width: 0.4 ms
Lead Channel Setting Sensing Sensitivity: 2.8 mV

## 2019-06-25 NOTE — Progress Notes (Signed)
Remote pacemaker transmission.   

## 2019-07-01 ENCOUNTER — Other Ambulatory Visit (HOSPITAL_COMMUNITY): Payer: Self-pay

## 2019-07-01 MED ORDER — HYDRALAZINE HCL 50 MG PO TABS: 50.0000 mg | ORAL_TABLET | Freq: Three times a day (TID) | ORAL | 2 refills | Status: DC

## 2019-07-13 ENCOUNTER — Encounter (HOSPITAL_COMMUNITY): Payer: Self-pay | Admitting: Internal Medicine

## 2019-07-13 ENCOUNTER — Other Ambulatory Visit: Payer: Self-pay

## 2019-07-13 ENCOUNTER — Ambulatory Visit (HOSPITAL_COMMUNITY)
Admission: RE | Admit: 2019-07-13 | Discharge: 2019-07-13 | Disposition: A | Discharge status: Home / Self Care | Payer: Medicare PPO | Source: Ambulatory Visit | Attending: Internal Medicine | Admitting: Internal Medicine

## 2019-07-13 VITALS — BP 180/88 | HR 70 | Wt 224.2 lb

## 2019-07-13 DIAGNOSIS — K219 Gastro-esophageal reflux disease without esophagitis: Secondary | ICD-10-CM | POA: Diagnosis not present

## 2019-07-13 DIAGNOSIS — E1122 Type 2 diabetes mellitus with diabetic chronic kidney disease: Secondary | ICD-10-CM | POA: Diagnosis not present

## 2019-07-13 DIAGNOSIS — Z79899 Other long term (current) drug therapy: Secondary | ICD-10-CM | POA: Insufficient documentation

## 2019-07-13 DIAGNOSIS — R0602 Shortness of breath: Secondary | ICD-10-CM | POA: Diagnosis not present

## 2019-07-13 DIAGNOSIS — M545 Low back pain: Secondary | ICD-10-CM | POA: Insufficient documentation

## 2019-07-13 DIAGNOSIS — Z950 Presence of cardiac pacemaker: Secondary | ICD-10-CM | POA: Diagnosis not present

## 2019-07-13 DIAGNOSIS — I130 Hypertensive heart and chronic kidney disease with heart failure and stage 1 through stage 4 chronic kidney disease, or unspecified chronic kidney disease: Secondary | ICD-10-CM | POA: Insufficient documentation

## 2019-07-13 DIAGNOSIS — N184 Chronic kidney disease, stage 4 (severe): Secondary | ICD-10-CM | POA: Diagnosis not present

## 2019-07-13 DIAGNOSIS — E1136 Type 2 diabetes mellitus with diabetic cataract: Secondary | ICD-10-CM | POA: Insufficient documentation

## 2019-07-13 DIAGNOSIS — E785 Hyperlipidemia, unspecified: Secondary | ICD-10-CM | POA: Diagnosis not present

## 2019-07-13 DIAGNOSIS — E1140 Type 2 diabetes mellitus with diabetic neuropathy, unspecified: Secondary | ICD-10-CM | POA: Diagnosis not present

## 2019-07-13 DIAGNOSIS — Z6839 Body mass index (BMI) 39.0-39.9, adult: Secondary | ICD-10-CM | POA: Diagnosis not present

## 2019-07-13 DIAGNOSIS — G4733 Obstructive sleep apnea (adult) (pediatric): Secondary | ICD-10-CM

## 2019-07-13 DIAGNOSIS — R0609 Other forms of dyspnea: Secondary | ICD-10-CM | POA: Insufficient documentation

## 2019-07-13 DIAGNOSIS — M1990 Unspecified osteoarthritis, unspecified site: Secondary | ICD-10-CM | POA: Diagnosis not present

## 2019-07-13 DIAGNOSIS — G2581 Restless legs syndrome: Secondary | ICD-10-CM | POA: Diagnosis not present

## 2019-07-13 DIAGNOSIS — I495 Sick sinus syndrome: Secondary | ICD-10-CM | POA: Diagnosis not present

## 2019-07-13 DIAGNOSIS — M797 Fibromyalgia: Secondary | ICD-10-CM | POA: Insufficient documentation

## 2019-07-13 DIAGNOSIS — I5032 Chronic diastolic (congestive) heart failure: Secondary | ICD-10-CM | POA: Insufficient documentation

## 2019-07-13 DIAGNOSIS — Z794 Long term (current) use of insulin: Secondary | ICD-10-CM | POA: Insufficient documentation

## 2019-07-13 DIAGNOSIS — G894 Chronic pain syndrome: Secondary | ICD-10-CM | POA: Insufficient documentation

## 2019-07-13 DIAGNOSIS — N1832 Chronic kidney disease, stage 3b: Secondary | ICD-10-CM | POA: Diagnosis not present

## 2019-07-13 DIAGNOSIS — F419 Anxiety disorder, unspecified: Secondary | ICD-10-CM | POA: Insufficient documentation

## 2019-07-13 DIAGNOSIS — Z87891 Personal history of nicotine dependence: Secondary | ICD-10-CM | POA: Insufficient documentation

## 2019-07-13 DIAGNOSIS — I10 Essential (primary) hypertension: Secondary | ICD-10-CM

## 2019-07-13 LAB — BASIC METABOLIC PANEL
Anion gap: 11 (ref 5–15)
BUN: 50 mg/dL — ABNORMAL HIGH (ref 8–23)
CO2: 26 mmol/L (ref 22–32)
Calcium: 9.5 mg/dL (ref 8.9–10.3)
Chloride: 100 mmol/L (ref 98–111)
Creatinine, Ser: 2.14 mg/dL — ABNORMAL HIGH (ref 0.44–1.00)
GFR calc Af Amer: 25 mL/min — ABNORMAL LOW (ref >60)
GFR calc non Af Amer: 21 mL/min — ABNORMAL LOW (ref >60)
Glucose, Bld: 98 mg/dL (ref 70–99)
Potassium: 3.9 mmol/L (ref 3.5–5.1)
Sodium: 137 mmol/L (ref 135–145)

## 2019-07-13 LAB — BRAIN NATRIURETIC PEPTIDE: B Natriuretic Peptide: 42.4 pg/mL (ref 0.0–100.0)

## 2019-07-13 MED ORDER — JARDIANCE 10 MG PO TABS: 10.0000 mg | ORAL_TABLET | Freq: Every day | ORAL | 6 refills | Status: DC

## 2019-07-13 MED ORDER — HYDRALAZINE HCL 100 MG PO TABS: 100.0000 mg | ORAL_TABLET | Freq: Three times a day (TID) | ORAL | 6 refills | Status: DC

## 2019-07-13 NOTE — Patient Instructions (Addendum)
Stop Januvia  Start Jardiance 10 mg daily  Increase Hydralazine to 100 mg Three times a day   Labs done today, we will notify you of abnormal results  Your physician has requested that you regularly monitor and record your blood pressure readings at home. Please use the same machine at the same time of day to check your readings and record them to bring to your follow-up visit.  You have been referred to Dr Radford Pax for sleep apnea, her office will call you for an appointment  You have been referred to Home Physical Therapy, [employer] THIS:  [phone] (my direct line, you can leave a voice mail) or  [email or web address]  Your physician recommends that you schedule a follow-up appointment in: 2 months  If you have any questions or concerns before your next appointment please send [place] a message through [place] or call our office at [phone].  At the [facility], you and your health needs are our priority. As part of our continuing mission to provide you with exceptional heart care, we have created designated Provider Care Teams. These Care Teams include your primary Cardiologist (physician) and Advanced Practice Providers (APPs- Physician Assistants and Nurse Practitioners) who all work together to provide you with the care you need, when you need it.   You may see any of the following providers on your designated Care Team at your next follow up: Marland Kitchen Dr Glori Bickers . Dr Loralie Champagne . Darrick Grinder, NP . Lyda Jester, PA   Please be sure to bring in all your medications bottles to every appointment.

## 2019-07-13 NOTE — Progress Notes (Signed)
ADVANCED HF CLINIC CONSULT NOTE  Primary Care: Pomposini, [name], MD EP: Dr Caryl Comes HF: Dr Haroldine Laws  HPI:  [name] is a 78 y.o. female with history of diastolic heart failure, DM, GERD, colon cancer, CKD stage III, 2016 pacemaker (MDT) due to symptomatic sinus node dysfunction.  In July 2019, she had 12 pound weight and she was sent to the ED by her PCP. She was sent home on metolazone every other day for 14 days. She took 4 doses and her creatinine went up to 3 so metolazone was stopped.   R/L cath in 9/19. Normal cors. Elevated filling pressures with pulmonary venous HTN.   She presents today for follow up.  Here with her daughter. Taking torsemide 40mg  daily. Continues to struggle with dyspnea. Says she weighs herself daily and weight about 220. Says she takes . Drinks 16oz Diet Pepsi and more water than that. Very sedentary. Can barely walk due to SOB and low back pain. Now watching her diet closely. Says that she is taking her BP regularly but then says her BP cuff isn't working. Cannot tell me what her recent BPs are at home. She was 105 pounds when she graduated HS. She is 224 pounds today  Sleep study 10/19: AHI 49  ReDS vest today 34%  Studies: PFTs 05/23/18: FEV1: 1.72 FEV1/FVC ratio: 77% DLCO: 70%  R/LHC 05/19/18: Ao = 192/74 (118) LV = 201/26 RA = 14 RV = 53/18 PA = 53/19 (38) PCW = 27 (v = 41) Fick cardiac output/index = 5.6/2.8 PVR = 2.0 WU Ao sat = 95% PA sat = 64%, 66% Assessment: 1. Normal coronaries with ectactic vessels suggestive of longstanding HTN 2. Severe HTN 3. Normal LV function 4. Signficantly elevated filling pressures with pulmonary venous HTN in setting of holding diuretics for 2 days Plan/Discussion: Filling pressures and BP elevated. Will resume diuretics. Will need aggressive titration of ant-HTN regimen.  Echo 05/14/18: - Left ventricle: Posterior basal and inferolateral hypokinesis The   cavity size was moderately dilated. Wall  thickness was increased   in a pattern of mild LVH. Systolic function was normal. The   estimated ejection fraction was in the range of 50% to 55%. Left   ventricular diastolic function parameters were normal. - Aortic valve: Sclerosis without stenosis. - Mitral valve: Moderately calcified annulus. Moderately thickened,   mildly calcified leaflets . - Left atrium: The atrium was mildly dilated. - Atrial septum: No defect or patent foramen ovale was identified.   06/21/2015 Myoview   Nuclear stress EF: 64%.  There was no ST segment deviation noted during stress.  The study is normal.  This is a low risk study. No ischemia identified.  SH: Former 30-40 pack year smoker, quit 25 years ago.   Review of systems complete and found to be negative unless listed in HPI.   Past Medical History:  Diagnosis Date  . Anxiety   . Arthritis   . Cancer ([place])    colon  . Chronic kidney disease    stage 3  . Chronic pain syndrome   . Diabetes ([place])   . Diabetic neuropathy ([place])   . Dyslipidemia   . Early cataracts, bilateral   . Fibromyalgia   . GERD (gastroesophageal reflux disease)   . H/O syncope   . Heart murmur   . Hypertension   . Lumbar spinal stenosis    and scoliosis  . Pneumonia   . PONV (postoperative nausea and vomiting)   . Presence of  permanent cardiac pacemaker   . Restless legs   . Spinal headache   . Thyroid nodule     Current Outpatient Medications  Medication Sig Dispense Refill  . acetaminophen (TYLENOL) 650 MG CR tablet Take 1,300 mg by mouth every 8 (eight) hours.     . Cholecalciferol (VITAMIN D) 2000 UNITS tablet Take 2,000 Units by mouth daily.    . Coenzyme Q10 (CO Q 10) 10 MG CAPS Co Q 10  200mg  q day    . cyclobenzaprine (FLEXERIL) 10 MG tablet Take 10 mg by mouth 3 (three) times daily as needed for muscle spasms.    Marland Kitchen doxylamine, Sleep, (UNISOM) 25 MG tablet Take 25 mg by mouth at bedtime.    . DULoxetine (CYMBALTA) 20 MG capsule Take 20 mg by  mouth daily.    . hydrALAZINE (APRESOLINE) 50 MG tablet Take 1 tablet (50 mg total) by mouth 3 (three) times daily. 270 tablet 2  . irbesartan (AVAPRO) 300 MG tablet Take 300 mg by mouth daily.  3  . labetalol (NORMODYNE) 200 MG tablet TAKE 1 TABLET(200 MG) BY MOUTH TWICE DAILY 180 tablet 3  . omeprazole (PRILOSEC OTC) 20 MG tablet Take 20 mg by mouth daily.    . pramipexole (MIRAPEX) 0.125 MG tablet Take 0.25 mg by mouth 2 (two) times daily.    . rosuvastatin (CRESTOR) 10 MG tablet     . sitaGLIPtin (JANUVIA) 100 MG tablet Take 50 mg by mouth daily.     Marland Kitchen torsemide (DEMADEX) 20 MG tablet Take 2 tablets (40 mg total) by mouth daily. 180 tablet 3  . traMADol (ULTRAM) 50 MG tablet Take 50 mg by mouth 3 (three) times daily as needed for moderate pain.     . TRESIBA FLEXTOUCH 200 UNIT/ML SOPN Inject 90 Units into the skin every morning.   5   No current facility-administered medications for this encounter.     Allergies  Allergen Reactions  . Penicillins Diarrhea and Other (See Comments)    Severe diarrhea Has patient had a PCN reaction causing immediate rash, facial/tongue/throat swelling, SOB or lightheadedness with hypotension: No Has patient had a PCN reaction causing severe rash involving mucus membranes or skin necrosis: No Has patient had a PCN reaction that required hospitalization No Has patient had a PCN reaction occurring within the last 10 years: No If all of the above answers are "NO", then may proceed with Cephalosporin use.   . Codeine Other (See Comments)    Unknown  . Adhesive [Tape] Rash  . Aspirin Other (See Comments)    Stomach burning  . Iodine Itching, Swelling, Rash and Other (See Comments)    Can use if Benadryl and Prednisone are used first   . Ivp Dye [Iodinated Diagnostic Agents] Other (See Comments)    Can use if Benadryl and Prednisone are used first    . Sulfa Antibiotics Diarrhea      Social History   Socioeconomic History  . Marital status:  Widowed    Spouse name: Not on file  . Number of children: Not on file  . Years of education: Not on file  . Highest education level: Not on file  Occupational History  . Not on file  Social Needs  . Financial resource strain: Not on file  . Food insecurity    Worry: Not on file    Inability: Not on file  . Transportation needs    Medical: Not on file    Non-medical: Not on file  Tobacco Use  . Smoking status: Former Smoker    Start date: 09/10/1953    Quit date: 09/11/1983    Years since quitting: 35.8  . Smokeless tobacco: Never Used  Substance and Sexual Activity  . Alcohol use: Yes    Alcohol/week: 0.0 standard drinks    Comment: rare glass of wine  . Drug use: No  . Sexual activity: Not on file  Lifestyle  . Physical activity    Days per week: Not on file    Minutes per session: Not on file  . Stress: Not on file  Relationships  . Social Herbalist on phone: Not on file    Gets together: Not on file    Attends religious service: Not on file    Active member of club or organization: Not on file    Attends meetings of clubs or organizations: Not on file    Relationship status: Not on file  . Intimate partner violence    Fear of current or ex partner: Not on file    Emotionally abused: Not on file    Physically abused: Not on file    Forced sexual activity: Not on file  Other Topics Concern  . Not on file  Social History Narrative  . Not on file      Family History  Problem Relation Age of Onset  . CAD Other   . Sick sinus syndrome Brother        has a PPM    Vitals:   07/13/19 1414  BP: (!) 180/88  Pulse: 70  SpO2: 96%  Weight: 101.7 kg (224 lb 3.2 oz)   Filed Weights   07/13/19 1414  Weight: 101.7 kg (224 lb 3.2 oz)     Wt Readings from Last 3 Encounters:  07/13/19 101.7 kg (224 lb 3.2 oz)  05/04/19 99.3 kg (219 lb)  11/05/18 103 kg (227 lb)     PHYSICAL EXAM: General:  Elderly obese . No resp difficulty HEENT: normal Neck:  supple. no JVD. Carotids 2+ bilat; no bruits. No lymphadenopathy or thryomegaly appreciated. Cor: PMI nondisplaced. Regular rate & rhythm. No rubs, gallops or murmurs. Lungs: clear Abdomen: obese soft, nontender, nondistended. No hepatosplenomegaly. No bruits or masses. Good bowel sounds. Extremities: no cyanosis, clubbing, rash, 2+ edema Neuro: alert & orientedx3, cranial nerves grossly intact. moves all 4 extremities w/o difficulty. Affect pleasant  ASSESSMENT & PLAN:  1. Chronic Diastolic Heart Failure  Echo 05/14/18: EF 50-55%, posterior basal and inferolateral HK, mild LVH, LA mildly dilated - R/LHC 05/19/18: normal coronaries, elevated filling pressure (had held diuretics x2 days), severe HTN. - Continues to struggled with NYHA IIIB dyspnea. She is extremely sedentary due to DOE and low back pain.  - Volume status not too bad today despite LE edema. ReDS 34% - Continue torsemide 40 daily - Check labs today - Hold off on spiro with previous AKI - Wear compression hose - I had a frank talk with her and her daughter today. I think her major issue here is here obesity and sedentary lifestyle. She has gained 120 pounds since she graduated [employer] and suspect the majority of her weight gain (and then some) is related to increased adiposity. I told her that her symptoms would not improve unless she made a concerted effort to lose weight using the [place] increase her activity level - I gave her the goal of walking to the mail box and back 1x/daily this week, 2x/daily  next week, 3x/daily thee week after and then getting up to 4x/daiy and staying there. I also asked her to get a FitBit with the goal of getting up to 2K steps per day - Refer to HHPT  2. HTN, severe, - increase hydral to 100 tid - needs to keep daily BP log and bring it to me - she has severe untreated OSA and says she cannot tolerate CPAP mask. - will refer to Dr. Radford Pax for suggestions here (? Oral appliance)  3. OSA  -  severe, AHI 49 by PSG in 10/19 - refer to Dr. Radford Pax  4. Dyspnea - LHC 05/19/18 with normal coronaries - PFTs with DLCO 70% - suspect mostly due to obesity + OSA and HTN  3. Mild OSA  - Had sleep study and has mild OSA. Dr Radford Pax is recommending CPAP titration.  4. CKD Stage III-IV - Creatinine 1.4 in 8/20.  - Recheck today  5. Sinus node dysfunction s/p MDT PPM - Follows with Dr Caryl Comes - Good HR variation with walking around clinic last visit 64 resting to 95 bpm walking  6. DM - Will switch Januvia to Jardiance  7. Deconditioning - HHPT referral   8. Severe obesity - see discussion above - Body mass index is 39.09 kg/m.   Total time spent 45 minutes. Over half that time spent discussing above.    Glori Bickers, MD 2:36 PM

## 2019-07-13 NOTE — Progress Notes (Signed)
ReDS Vest / Clip - 07/13/19 1400      ReDS Vest / Clip   Station Marker  A    Ruler Value  33    ReDS Value  Low volume    Anatomical Comments  sitting

## 2019-07-15 ENCOUNTER — Telehealth (HOSPITAL_COMMUNITY): Payer: Self-pay

## 2019-07-15 DIAGNOSIS — I5032 Chronic diastolic (congestive) heart failure: Secondary | ICD-10-CM

## 2019-07-15 NOTE — Telephone Encounter (Signed)
-----   Message from Jolaine Artist, MD sent at 07/13/2019 10:18 PM EST ----- Renal function a bit worse. Recheck next week please.

## 2019-07-22 ENCOUNTER — Other Ambulatory Visit (HOSPITAL_COMMUNITY): Payer: Medicare PPO

## 2019-07-24 ENCOUNTER — Other Ambulatory Visit (HOSPITAL_COMMUNITY): Payer: Medicare PPO

## 2019-07-27 ENCOUNTER — Other Ambulatory Visit (HOSPITAL_COMMUNITY): Payer: Medicare PPO

## 2019-07-27 ENCOUNTER — Other Ambulatory Visit (HOSPITAL_COMMUNITY): Payer: Self-pay | Admitting: *Deleted

## 2019-07-27 DIAGNOSIS — I495 Sick sinus syndrome: Secondary | ICD-10-CM

## 2019-07-29 ENCOUNTER — Other Ambulatory Visit: Payer: Self-pay | Admitting: Internal Medicine

## 2019-07-30 LAB — BASIC METABOLIC PANEL
BUN/Creatinine Ratio: 33 — ABNORMAL HIGH (ref 12–28)
BUN: 67 mg/dL — ABNORMAL HIGH (ref 8–27)
CO2: 23 mmol/L (ref 20–29)
Calcium: 10 mg/dL (ref 8.7–10.3)
Chloride: 96 mmol/L (ref 96–106)
Creatinine, Ser: 2.05 mg/dL — ABNORMAL HIGH (ref 0.57–1.00)
GFR calc Af Amer: 26 mL/min/{1.73_m2} — ABNORMAL LOW (ref >59)
GFR calc non Af Amer: 22 mL/min/{1.73_m2} — ABNORMAL LOW (ref >59)
Glucose: 82 mg/dL (ref 65–99)
Potassium: 4.7 mmol/L (ref 3.5–5.2)
Sodium: 139 mmol/L (ref 134–144)

## 2019-08-12 ENCOUNTER — Telehealth (HOSPITAL_COMMUNITY): Payer: Self-pay

## 2019-08-12 NOTE — Telephone Encounter (Signed)
No changes

## 2019-08-12 NOTE — Telephone Encounter (Signed)
Pt called asking for results of blood work on 11/18.  Results provided and she asked if MD wants to make any changes to her medication based on lab results. Pt has no complaints at present Forwarded to MD. Please advise.

## 2019-08-13 NOTE — Telephone Encounter (Signed)
Pt aware there are no changes to medications at this time. Will continue to take all meds as prescribed.verbalized understanding

## 2019-08-24 ENCOUNTER — Ambulatory Visit: Payer: Medicare PPO | Admitting: Cardiology

## 2019-09-15 ENCOUNTER — Ambulatory Visit (INDEPENDENT_AMBULATORY_CARE_PROVIDER_SITE_OTHER): Payer: Medicare PPO | Admitting: *Deleted

## 2019-09-15 DIAGNOSIS — I495 Sick sinus syndrome: Secondary | ICD-10-CM

## 2019-09-15 LAB — CUP PACEART REMOTE DEVICE CHECK
Battery Remaining Longevity: 81 mo
Battery Voltage: 3.01 V
Brady Statistic AP VP Percent: 1.23 %
Brady Statistic AP VS Percent: 95.02 %
Brady Statistic AS VP Percent: 0 %
Brady Statistic AS VS Percent: 3.75 %
Brady Statistic RA Percent Paced: 91.61 %
Brady Statistic RV Percent Paced: 1.29 %
Date Time Interrogation Session: 20210105161547
Implantable Lead Implant Date: 20161110
Implantable Lead Implant Date: 20161110
Implantable Lead Location: 753859
Implantable Lead Location: 753860
Implantable Lead Model: 5076
Implantable Lead Model: 5076
Implantable Pulse Generator Implant Date: 20161110
Lead Channel Impedance Value: 399 Ohm
Lead Channel Impedance Value: 437 Ohm
Lead Channel Impedance Value: 494 Ohm
Lead Channel Impedance Value: 532 Ohm
Lead Channel Pacing Threshold Amplitude: 0.5 V
Lead Channel Pacing Threshold Amplitude: 1 V
Lead Channel Pacing Threshold Pulse Width: 0.4 ms
Lead Channel Pacing Threshold Pulse Width: 0.4 ms
Lead Channel Sensing Intrinsic Amplitude: 4 mV
Lead Channel Sensing Intrinsic Amplitude: 4 mV
Lead Channel Sensing Intrinsic Amplitude: 9.75 mV
Lead Channel Sensing Intrinsic Amplitude: 9.75 mV
Lead Channel Setting Pacing Amplitude: 1.5 V
Lead Channel Setting Pacing Amplitude: 2 V
Lead Channel Setting Pacing Pulse Width: 0.4 ms
Lead Channel Setting Sensing Sensitivity: 2.8 mV

## 2019-09-16 ENCOUNTER — Encounter (HOSPITAL_COMMUNITY): Payer: Medicare PPO | Admitting: Internal Medicine

## 2019-09-29 NOTE — Progress Notes (Signed)
Virtual Visit via Telephone Note   This visit type was conducted due to national recommendations for restrictions regarding the COVID-19 Pandemic (e.g. social distancing) in an effort to limit this patient's exposure and mitigate transmission in our community.  Due to her co-morbid illnesses, this patient is at least at moderate risk for complications without adequate follow up.  This format is felt to be most appropriate for this patient at this time.  All issues noted in this document were discussed and addressed.  A limited physical exam was performed with this format.  Please refer to the patient's chart for her consent to telehealth for [facility].   Evaluation Performed:  Follow-up visit  This visit type was conducted due to national recommendations for restrictions regarding the COVID-19 Pandemic (e.g. social distancing).  This format is felt to be most appropriate for this patient at this time.  All issues noted in this document were discussed and addressed.  No physical exam was performed (except for noted visual exam findings with Video Visits).  Please refer to the patient's chart (MyChart message for video visits and phone note for telephone visits) for the patient's consent to telehealth for [facility].  Date:  09/30/2019   ID:  [name], DOB [birth year], MRN [id number]  Patient Location:  Home  Provider location:   [place]  PCP:  Pomposini, [name], MD  Cardiologist:  Glori Bickers, MD Sleep Medicine:  Fransico Him, MD Electrophysiologist:  None   Chief Complaint:  OSA  History of Present Illness:    [name] is a 79 y.o. female who presents via audio/video conferencing for a telehealth visit today.    This is an 79yo female with a hx of CKD stage 3, DM, DHL, GERD and PPM who was referred for sleep study for snoring and obesity. She feels rested when she gets up in the am although she says that she does not sleep much. She occasionally naps during  the day if she sits down to rest after taking pain medication.  She underwent sleep study showing  Mild OSA with an AHI of 11.2/hr with no central events and O2 sats as low as 85% with moderate snoring and nocturnal hypoxemia.  CPAP titration was recommended.  She opted not to proceed with CPAP titration.    The patient does not have symptoms concerning for COVID-19 infection (fever, chills, cough, or new shortness of breat  Prior CV studies:   The following studies were reviewed today:  Sleep study  Past Medical History:  Diagnosis Date  . Anxiety   . Arthritis   . Cancer ([place])    colon  . Chronic kidney disease    stage 3  . Chronic pain syndrome   . Diabetes ([place])   . Diabetic neuropathy ([place])   . Dyslipidemia   . Early cataracts, bilateral   . Fibromyalgia   . GERD (gastroesophageal reflux disease)   . H/O syncope   . Heart murmur   . Hypertension   . Lumbar spinal stenosis    and scoliosis  . Pneumonia   . PONV (postoperative nausea and vomiting)   . Presence of permanent cardiac pacemaker   . Restless legs   . Spinal headache   . Thyroid nodule    Past Surgical History:  Procedure Laterality Date  . APPENDECTOMY    . BREAST SURGERY     lumpectomy  . COLON SURGERY    . DILATION AND CURETTAGE OF UTERUS    .  EP IMPLANTABLE DEVICE N/A 07/21/2015   Procedure: Pacemaker Implant;  Surgeon: Deboraha Sprang, MD;  Location: [place] [facility];  Service: Cardiovascular;  Laterality: N/A;  . LUMBAR LAMINECTOMY/DECOMPRESSION MICRODISCECTOMY N/A 02/02/2016   Procedure: DECOMPRESSION L4-L5 WITH INSITE 2 FUSION ;  Surgeon: Melina Schools, MD;  Location: [place];  Service: Orthopedics;  Laterality: N/A;  . PARTIAL NEPHRECTOMY    . RIGHT/LEFT HEART CATH AND CORONARY ANGIOGRAPHY N/A 05/19/2018   Procedure: RIGHT/LEFT HEART CATH AND CORONARY ANGIOGRAPHY;  Surgeon: Jolaine Artist, MD;  Location: [place] [facility];  Service: Cardiovascular;  Laterality: N/A;  . TONSILLECTOMY    .  TUBAL LIGATION       Current Meds  Medication Sig  . ACCU-CHEK AVIVA PLUS test strip   . acetaminophen (TYLENOL) 650 MG CR tablet Take 1,300 mg by mouth every 8 (eight) hours.   . B-D UF III MINI PEN NEEDLES 31G X 5 MM MISC   . Cholecalciferol (VITAMIN D) 2000 UNITS tablet Take 2,000 Units by mouth daily.  . cyclobenzaprine (FLEXERIL) 10 MG tablet Take 10 mg by mouth 3 (three) times daily as needed for muscle spasms.  Marland Kitchen doxylamine, Sleep, (UNISOM) 25 MG tablet Take 25 mg by mouth at bedtime.  . DULoxetine (CYMBALTA) 20 MG capsule Take 20 mg by mouth daily.  . empagliflozin (JARDIANCE) 10 MG TABS tablet Take 10 mg by mouth daily before breakfast.  . glucose blood (ACCU-CHEK AVIVA PLUS) test strip TK TO TEST BLOOD SUGAR QD  . glucose blood (ACCU-CHEK AVIVA PLUS) test strip daily  . hydrALAZINE (APRESOLINE) 100 MG tablet Take 1 tablet (100 mg total) by mouth 3 (three) times daily.  . irbesartan (AVAPRO) 300 MG tablet Take 300 mg by mouth daily.  Marland Kitchen labetalol (NORMODYNE) 200 MG tablet TAKE 1 TABLET(200 MG) BY MOUTH TWICE DAILY  . omeprazole (PRILOSEC OTC) 20 MG tablet Take 20 mg by mouth daily as needed.   . pramipexole (MIRAPEX) 0.125 MG tablet Take 0.25 mg by mouth 2 (two) times daily.  . rosuvastatin (CRESTOR) 10 MG tablet Take 10 mg by mouth daily.   Marland Kitchen torsemide (DEMADEX) 20 MG tablet Take 2 tablets (40 mg total) by mouth daily.  . traMADol (ULTRAM) 50 MG tablet Take 50 mg by mouth 3 (three) times daily as needed for moderate pain.   . TRESIBA FLEXTOUCH 200 UNIT/ML SOPN Inject 90 Units into the skin every morning.      Allergies:   Penicillins, Codeine, Adhesive [tape], Aspirin, Iodine, Ivp dye [iodinated diagnostic agents], and Sulfa antibiotics   Social History   Tobacco Use  . Smoking status: Former Smoker    Start date: 09/10/1953    Quit date: 09/11/1983    Years since quitting: 36.0  . Smokeless tobacco: Never Used  Substance Use Topics  . Alcohol use: Yes    Alcohol/week: 0.0  standard drinks    Comment: rare glass of wine  . Drug use: No     Family Hx: The patient's family history includes CAD in an other family member; Sick sinus syndrome in her brother.  ROS:   Please see the history of present illness.     All other systems reviewed and are negative.   Labs/Other Tests and Data Reviewed:    Recent Labs: 07/13/2019: B Natriuretic Peptide 42.4 07/29/2019: BUN 67; Creatinine, Ser 2.05; Potassium 4.7; Sodium 139   Recent Lipid Panel No results found for: CHOL, TRIG, HDL, CHOLHDL, LDLCALC, LDLDIRECT  Wt Readings from Last 3 Encounters:  09/30/19 211 lb (95.7 kg)  07/13/19 224 lb 3.2 oz (101.7 kg)  05/04/19 219 lb (99.3 kg)     Objective:    Vital Signs:  BP (!) 114/50   Pulse 64   Ht 5' 3.5" (1.613 m)   Wt 211 lb (95.7 kg)   BMI 36.79 kg/m     ASSESSMENT & PLAN:    1.  OSA - She has mild OSA with an AHI of 11/hr and nocturnal hypoxemia.  She tells me she is very claustraphobic and does not want anything around her face so she is not interested in using CPAP.  She says that she may be able to tolerate O2. She wants to wait to talk with Dr. Haroldine Laws in regards to adding O2 at night but she is adamant that she does not want CPAP.  Her OSA is not severe enough to qualify for the hypoglossal nerve stimulator.  I will defer back to Dr. Haroldine Laws.   2.  HTN -BP controlled -continue Labetalol 200mg  BID, Hydralazine 100mg  TID and Irbesartan 300mg  daily  3.  Morbid Obesity -she does not get any aerobic exercise due to severe back pain.   COVID-19 Education: The signs and symptoms of COVID-19 were discussed with the patient and how to seek care for testing (follow up with PCP or arrange E-visit).  The importance of social distancing was discussed today.  Patient Risk:   After full review of this patient's clinical status, I feel that they are at least moderate risk at this time.  Time:   Today, I have spent 20 minutes directly with the patient  on telemedicine discussing medical problems including OSA, HTN.  We also reviewed the symptoms of COVID 19 and the ways to protect against contracting the virus with telehealth technology.  I spent an additional 5 minutes reviewing patient's chart including sleep study.  Medication Adjustments/Labs and Tests Ordered: Current medicines are reviewed at length with the patient today.  Concerns regarding medicines are outlined above.  Tests Ordered: No orders of the defined types were placed in this encounter.  Medication Changes: No orders of the defined types were placed in this encounter.   Disposition:  Follow up PRN  Signed, Fransico Him, MD  09/30/2019 1:28 PM    [place]

## 2019-09-30 ENCOUNTER — Telehealth (INDEPENDENT_AMBULATORY_CARE_PROVIDER_SITE_OTHER): Payer: Medicare PPO | Admitting: Cardiology

## 2019-09-30 ENCOUNTER — Other Ambulatory Visit: Payer: Self-pay

## 2019-09-30 ENCOUNTER — Encounter: Payer: Self-pay | Admitting: Cardiology

## 2019-09-30 VITALS — BP 114/50 | HR 64 | Ht 63.5 in | Wt 211.0 lb

## 2019-09-30 DIAGNOSIS — G4733 Obstructive sleep apnea (adult) (pediatric): Secondary | ICD-10-CM | POA: Diagnosis not present

## 2019-09-30 DIAGNOSIS — I10 Essential (primary) hypertension: Secondary | ICD-10-CM

## 2019-09-30 NOTE — Patient Instructions (Signed)
Medication Instructions:  Your physician recommends that you continue on your current medications as directed. Please refer to the Current Medication list given to you today.  *If you need a refill on your cardiac medications before your next appointment, please call your pharmacy*  Follow-Up: At [facility], you and your health needs are our priority.  As part of our continuing mission to provide you with exceptional heart care, we have created designated Provider Care Teams.  These Care Teams include your primary Cardiologist (physician) and Advanced Practice Providers (APPs -  Physician Assistants and Nurse Practitioners) who all work together to provide you with the care you need, when you need it.  Follow up with Dr. Turner as needed 

## 2019-10-16 ENCOUNTER — Telehealth (HOSPITAL_COMMUNITY): Payer: Self-pay

## 2019-10-16 NOTE — Telephone Encounter (Signed)
Patient called stating that she may have had a possible covid exposure so I went ahead and changed her to a virtual appointment on Monday.  Patient also stated that she was having increased leg swelling with increased fatigued. She also reported that her legs were weeping and red but no pain in both legs. I advised patient that she should be seen in the ER due to the risk of possible infection. Patient declined and stated she would just wait to talk to Dr. Haroldine Laws on Monday about it. I did advise patient that if she started having pain in legs,nausea vomiting,green/yellow discharge from legs she should not hesitate to go the ER. Patient verbalized understanding

## 2019-10-19 ENCOUNTER — Other Ambulatory Visit: Payer: Self-pay

## 2019-10-19 ENCOUNTER — Ambulatory Visit (HOSPITAL_COMMUNITY)
Admission: RE | Admit: 2019-10-19 | Discharge: 2019-10-19 | Disposition: A | Discharge status: Home / Self Care | Payer: Medicare PPO | Source: Ambulatory Visit | Attending: Internal Medicine | Admitting: Internal Medicine

## 2019-10-19 DIAGNOSIS — I10 Essential (primary) hypertension: Secondary | ICD-10-CM

## 2019-10-19 DIAGNOSIS — I5032 Chronic diastolic (congestive) heart failure: Secondary | ICD-10-CM

## 2019-10-19 DIAGNOSIS — G4733 Obstructive sleep apnea (adult) (pediatric): Secondary | ICD-10-CM

## 2019-10-19 NOTE — Patient Instructions (Signed)
Follow up appointment scheduled for Tue 2/16 at 1:30 PM

## 2019-10-19 NOTE — Addendum Note (Signed)
Encounter addended by: Scarlette Calico, RN on: 10/19/2019 4:31 PM  Actions taken: Clinical Note Signed

## 2019-10-19 NOTE — Progress Notes (Signed)
Spoke w/pt via phone regarding Dr Bensimhon's recommendations:  1. Please schedule visit with me or NP next week  2. Have her see her PCP this week     Appt sch for Tue 2/16 at 1:30 PM in our office, she is going to call her PCP tomorrow to get an appt this week.

## 2019-10-19 NOTE — Progress Notes (Signed)
Heart Failure TeleHealth Note  Due to national recommendations of social distancing due to [place] 19, Audio/video telehealth visit is felt to be most appropriate for this patient at this time.  See MyChart message from today for patient consent regarding telehealth for [facility].  Date:  10/19/2019   ID:  [name], DOB 1940/09/29, MRN [id number]  Location: Home  Provider location: [place] [facility] Type of Visit: Established patient  PCP:  Pomposini, [name], MD  Cardiologist:  Fransico Him, MD Primary HF: [name]  Chief Complaint: Heart Failure follow-up   History of Present Illness:  [name] is a 79 y.o. female with history of diastolic heart failure, DM, GERD, colon cancer, CKD stage III, 2016 pacemaker (MDT) due to symptomatic sinus node dysfunction.  In July 2019, she had 12 pound weight and she was sent to the ED by her PCP. She was sent home on metolazone every other day for 14 days. She took 4 doses and her creatinine went up to 3 so metolazone was stopped.   R/L cath in 9/19. Normal cors. Elevated filling pressures with pulmonary venous HTN.   She presents via Engineer, civil (consulting) for a telehealth visit today. Says her legs are "immense" and have open sores. Very swollen. Weight this am was 217. (At last visit was 224. Main complaint is itching all over. Ability to walk is very limited due to SOB and back pain. Takes BP regularly 140-180. Drinks water and a little Diet Pepsi. Trying to cut back but her mouth stays so dry. Taking torsemide 40 daily but will take 60 when she feels she is going up. Lower extremity edema does not get better with additional torsemide.   Sleep study 10/19: AHI 49  ReDS vest today 34%  Studies: PFTs 05/23/18: FEV1: 1.72 FEV1/FVC ratio: 77% DLCO: 70%  R/LHC 05/19/18: Ao = 192/74 (118) LV = 201/26 RA = 14 RV = 53/18 PA = 53/19 (38) PCW = 27 (v = 41) Fick cardiac output/index = 5.6/2.8 PVR = 2.0  WU Ao sat = 95% PA sat = 64%, 66% Assessment: 1. Normal coronaries with ectactic vessels suggestive of longstanding HTN 2. Severe HTN 3. Normal LV function 4. Signficantly elevated filling pressures with pulmonary venous HTN in setting of holding diuretics for 2 days Plan/Discussion: Filling pressures and BP elevated. Will resume diuretics. Will need aggressive titration of ant-HTN regimen.  Echo 05/14/18: - Left ventricle: Posterior basal and inferolateral hypokinesis The   cavity size was moderately dilated. Wall thickness was increased   in a pattern of mild LVH. Systolic function was normal. The   estimated ejection fraction was in the range of 50% to 55%. Left   ventricular diastolic function parameters were normal. - Aortic valve: Sclerosis without stenosis. - Mitral valve: Moderately calcified annulus. Moderately thickened,   mildly calcified leaflets . - Left atrium: The atrium was mildly dilated. - Atrial septum: No defect or patent foramen ovale was identified.   06/21/2015 Myoview   Nuclear stress EF: 64%.  There was no ST segment deviation noted during stress.  The study is normal.  This is a low risk study. No ischemia identified.  SH: Former 30-40 pack year smoker, quit 25 years ago.    [name] denies symptoms worrisome for COVID 19.   Past Medical History:  Diagnosis Date  . Anxiety   . Arthritis   . Cancer ([place])    colon  . Chronic kidney disease  stage 3  . Chronic pain syndrome   . Diabetes ([place])   . Diabetic neuropathy ([place])   . Dyslipidemia   . Early cataracts, bilateral   . Fibromyalgia   . GERD (gastroesophageal reflux disease)   . H/O syncope   . Heart murmur   . Hypertension   . Lumbar spinal stenosis    and scoliosis  . Pneumonia   . PONV (postoperative nausea and vomiting)   . Presence of permanent cardiac pacemaker   . Restless legs   . Spinal headache   . Thyroid nodule    Past Surgical History:  Procedure Laterality  Date  . APPENDECTOMY    . BREAST SURGERY     lumpectomy  . COLON SURGERY    . DILATION AND CURETTAGE OF UTERUS    . EP IMPLANTABLE DEVICE N/A 07/21/2015   Procedure: Pacemaker Implant;  Surgeon: Deboraha Sprang, MD;  Location: [place] [facility];  Service: Cardiovascular;  Laterality: N/A;  . LUMBAR LAMINECTOMY/DECOMPRESSION MICRODISCECTOMY N/A 02/02/2016   Procedure: DECOMPRESSION L4-L5 WITH INSITE 2 FUSION ;  Surgeon: Melina Schools, MD;  Location: [place];  Service: Orthopedics;  Laterality: N/A;  . PARTIAL NEPHRECTOMY    . RIGHT/LEFT HEART CATH AND CORONARY ANGIOGRAPHY N/A 05/19/2018   Procedure: RIGHT/LEFT HEART CATH AND CORONARY ANGIOGRAPHY;  Surgeon: Jolaine Artist, MD;  Location: [place] [facility];  Service: Cardiovascular;  Laterality: N/A;  . TONSILLECTOMY    . TUBAL LIGATION       Current Outpatient Medications  Medication Sig Dispense Refill  . ACCU-CHEK AVIVA PLUS test strip     . acetaminophen (TYLENOL) 650 MG CR tablet Take 1,300 mg by mouth every 8 (eight) hours.     . B-D UF III MINI PEN NEEDLES 31G X 5 MM MISC     . Cholecalciferol (VITAMIN D) 2000 UNITS tablet Take 2,000 Units by mouth daily.    . cyclobenzaprine (FLEXERIL) 10 MG tablet Take 10 mg by mouth 3 (three) times daily as needed for muscle spasms.    Marland Kitchen doxylamine, Sleep, (UNISOM) 25 MG tablet Take 25 mg by mouth at bedtime.    . DULoxetine (CYMBALTA) 20 MG capsule Take 20 mg by mouth daily.    . empagliflozin (JARDIANCE) 10 MG TABS tablet Take 10 mg by mouth daily before breakfast. 30 tablet 6  . glucose blood (ACCU-CHEK AVIVA PLUS) test strip TK TO TEST BLOOD SUGAR QD    . glucose blood (ACCU-CHEK AVIVA PLUS) test strip daily    . hydrALAZINE (APRESOLINE) 100 MG tablet Take 1 tablet (100 mg total) by mouth 3 (three) times daily. 90 tablet 6  . irbesartan (AVAPRO) 300 MG tablet Take 300 mg by mouth daily.  3  . labetalol (NORMODYNE) 200 MG tablet TAKE 1 TABLET(200 MG) BY MOUTH TWICE DAILY 180 tablet 3  .  omeprazole (PRILOSEC OTC) 20 MG tablet Take 20 mg by mouth daily as needed.     . pramipexole (MIRAPEX) 0.125 MG tablet Take 0.25 mg by mouth 2 (two) times daily.    . rosuvastatin (CRESTOR) 10 MG tablet Take 10 mg by mouth daily.     Marland Kitchen torsemide (DEMADEX) 20 MG tablet Take 2 tablets (40 mg total) by mouth daily. 180 tablet 3  . traMADol (ULTRAM) 50 MG tablet Take 50 mg by mouth 3 (three) times daily as needed for moderate pain.     . TRESIBA FLEXTOUCH 200 UNIT/ML SOPN Inject 90 Units into the skin every morning.  5   No current facility-administered medications for this encounter.    Allergies:   Penicillins, Codeine, Adhesive [tape], Aspirin, Iodine, Ivp dye [iodinated diagnostic agents], and Sulfa antibiotics   Social History:  The patient  reports that she quit smoking about 36 years ago. She started smoking about 66 years ago. She has never used smokeless tobacco. She reports current alcohol use. She reports that she does not use drugs.   Family History:  The patient's family history includes CAD in an other family member; Sick sinus syndrome in her brother.   ROS:  Please see the history of present illness.   All other systems are personally reviewed and negative.    Exam:  (Video/Tele Health Call; Exam is subjective and or/visual.) General:  Speaks in full sentences. No resp difficulty. Lungs: Normal respiratory effort with conversation.  Abdomen: Obese Non-distended per patient report Extremities: Pt reports edema and sores Neuro: Alert & oriented x 3.   Recent Labs: 07/13/2019: B Natriuretic Peptide 42.4 07/29/2019: BUN 67; Creatinine, Ser 2.05; Potassium 4.7; Sodium 139  Personally reviewed   Wt Readings from Last 3 Encounters:  09/30/19 95.7 kg (211 lb)  07/13/19 101.7 kg (224 lb 3.2 oz)  05/04/19 99.3 kg (219 lb)      ASSESSMENT AND PLAN:  1. Chronic Diastolic Heart Failure  - Echo 05/14/18: EF 50-55%, posterior basal and inferolateral HK, mild LVH, LA mildly  dilated - R/LHC 05/19/18: normal coronaries, elevated filling pressure (had held diuretics x2 days), severe HTN. - Continues to struggled with NYHA IIIB dyspnea. She is extremely sedentary due to DOE and low back pain.  - Volume status not too bad at last visit ReDS 34%. However today she reports it is much worse but weight is reportedly down 6 pounds. I asked her to go to PCP this week to be reassessed and potentially get HHRN to wrap her legs. Will arrange visit here in next 1-2 weeks to follow up  - Continue torsemide 40 daily for now. Will likely need to increase.  - Hold off on spiro with previous AKI - Continue compression hose - I had a frank talk with her and her daughter at our last visit. I think her major issue is obesity and sedentary lifestyle. She has gained 120 pounds since she graduated HS and suspect the majority of her weight gain (and then some) is related to increased adiposity. I told her that her symptoms would not improve unless she made a concerted effort to lose weight using the [place] increase her activity level  2. HTN, severe, - remains high - can add cardura at next visit if BP still high  3. OSA  - severe, AHI 49 by PSG in 10/19 - f/u testing AHI 11 - Had televisit with Dr. Lajoyce Lauber on 09/30/19. She refused CPAP due to claustrophobia.   4. Dyspnea - LHC 05/19/18 with normal coronaries - PFTs with DLCO 70% - suspect mostly due to obesity + OSA and HTN  3. Mild OSA  - Had sleep study and has mild OSA. Dr Radford Pax is recommending CPAP titration.  4. CKD Stage III-IV - Creatinine 1.4 in 8/20.  - Recheck today  5. Sinus node dysfunction s/p MDT PPM - Follows with Dr Caryl Comes - Good HR variation with walking around clinic last visit 64 resting to 95 bpm walking  6. DM - Continue Jardiance   7. Severe obesity - see discussion above - Body mass index is 39.09 kg/m.  COVID screen The patient does not have any symptoms that suggest any further  testing/ screening at this time.  Social distancing reinforced today.  Recommended follow-up:  As above  Relevant cardiac medications were reviewed at length with the patient today.   The patient does not have concerns regarding their medications at this time.   The following changes were made today:  As above  Today, I have spent 16 minutes with the patient with telehealth technology discussing the above issues .    Signed, Glori Bickers, MD  10/19/2019 3:46 PM  [facility] [place] [address] [facility] [place] [address] [phone] (office) [phone] (fax)

## 2019-10-27 ENCOUNTER — Ambulatory Visit (HOSPITAL_COMMUNITY)
Admission: RE | Admit: 2019-10-27 | Discharge: 2019-10-27 | Disposition: A | Discharge status: Home / Self Care | Payer: Medicare PPO | Source: Ambulatory Visit | Attending: Cardiology | Admitting: Cardiology

## 2019-10-27 ENCOUNTER — Other Ambulatory Visit: Payer: Self-pay

## 2019-10-27 ENCOUNTER — Encounter (HOSPITAL_COMMUNITY): Payer: Self-pay

## 2019-10-27 VITALS — BP 148/70 | HR 65 | Wt 220.4 lb

## 2019-10-27 DIAGNOSIS — R0609 Other forms of dyspnea: Secondary | ICD-10-CM | POA: Diagnosis not present

## 2019-10-27 DIAGNOSIS — Z91041 Radiographic dye allergy status: Secondary | ICD-10-CM | POA: Insufficient documentation

## 2019-10-27 DIAGNOSIS — K219 Gastro-esophageal reflux disease without esophagitis: Secondary | ICD-10-CM | POA: Insufficient documentation

## 2019-10-27 DIAGNOSIS — Z888 Allergy status to other drugs, medicaments and biological substances status: Secondary | ICD-10-CM | POA: Insufficient documentation

## 2019-10-27 DIAGNOSIS — M419 Scoliosis, unspecified: Secondary | ICD-10-CM | POA: Insufficient documentation

## 2019-10-27 DIAGNOSIS — Z882 Allergy status to sulfonamides status: Secondary | ICD-10-CM | POA: Insufficient documentation

## 2019-10-27 DIAGNOSIS — Z87891 Personal history of nicotine dependence: Secondary | ICD-10-CM | POA: Diagnosis not present

## 2019-10-27 DIAGNOSIS — M1990 Unspecified osteoarthritis, unspecified site: Secondary | ICD-10-CM | POA: Diagnosis not present

## 2019-10-27 DIAGNOSIS — Z79899 Other long term (current) drug therapy: Secondary | ICD-10-CM | POA: Insufficient documentation

## 2019-10-27 DIAGNOSIS — G894 Chronic pain syndrome: Secondary | ICD-10-CM | POA: Diagnosis not present

## 2019-10-27 DIAGNOSIS — M797 Fibromyalgia: Secondary | ICD-10-CM | POA: Diagnosis not present

## 2019-10-27 DIAGNOSIS — I495 Sick sinus syndrome: Secondary | ICD-10-CM | POA: Insufficient documentation

## 2019-10-27 DIAGNOSIS — Z885 Allergy status to narcotic agent status: Secondary | ICD-10-CM | POA: Diagnosis not present

## 2019-10-27 DIAGNOSIS — Z8249 Family history of ischemic heart disease and other diseases of the circulatory system: Secondary | ICD-10-CM | POA: Insufficient documentation

## 2019-10-27 DIAGNOSIS — Z794 Long term (current) use of insulin: Secondary | ICD-10-CM | POA: Insufficient documentation

## 2019-10-27 DIAGNOSIS — E1122 Type 2 diabetes mellitus with diabetic chronic kidney disease: Secondary | ICD-10-CM | POA: Diagnosis not present

## 2019-10-27 DIAGNOSIS — I130 Hypertensive heart and chronic kidney disease with heart failure and stage 1 through stage 4 chronic kidney disease, or unspecified chronic kidney disease: Secondary | ICD-10-CM | POA: Diagnosis present

## 2019-10-27 DIAGNOSIS — G2581 Restless legs syndrome: Secondary | ICD-10-CM | POA: Insufficient documentation

## 2019-10-27 DIAGNOSIS — M48061 Spinal stenosis, lumbar region without neurogenic claudication: Secondary | ICD-10-CM | POA: Insufficient documentation

## 2019-10-27 DIAGNOSIS — E785 Hyperlipidemia, unspecified: Secondary | ICD-10-CM | POA: Diagnosis not present

## 2019-10-27 DIAGNOSIS — Z950 Presence of cardiac pacemaker: Secondary | ICD-10-CM | POA: Diagnosis not present

## 2019-10-27 DIAGNOSIS — N183 Chronic kidney disease, stage 3 unspecified: Secondary | ICD-10-CM | POA: Insufficient documentation

## 2019-10-27 DIAGNOSIS — Z6838 Body mass index (BMI) 38.0-38.9, adult: Secondary | ICD-10-CM | POA: Diagnosis not present

## 2019-10-27 DIAGNOSIS — I5032 Chronic diastolic (congestive) heart failure: Secondary | ICD-10-CM | POA: Insufficient documentation

## 2019-10-27 DIAGNOSIS — E1140 Type 2 diabetes mellitus with diabetic neuropathy, unspecified: Secondary | ICD-10-CM | POA: Insufficient documentation

## 2019-10-27 DIAGNOSIS — Z85038 Personal history of other malignant neoplasm of large intestine: Secondary | ICD-10-CM | POA: Diagnosis not present

## 2019-10-27 DIAGNOSIS — F419 Anxiety disorder, unspecified: Secondary | ICD-10-CM | POA: Insufficient documentation

## 2019-10-27 DIAGNOSIS — Z880 Allergy status to penicillin: Secondary | ICD-10-CM | POA: Diagnosis not present

## 2019-10-27 DIAGNOSIS — G4733 Obstructive sleep apnea (adult) (pediatric): Secondary | ICD-10-CM | POA: Diagnosis not present

## 2019-10-27 DIAGNOSIS — Z886 Allergy status to analgesic agent status: Secondary | ICD-10-CM | POA: Insufficient documentation

## 2019-10-27 LAB — BASIC METABOLIC PANEL
Anion gap: 12 (ref 5–15)
BUN: 36 mg/dL — ABNORMAL HIGH (ref 8–23)
CO2: 26 mmol/L (ref 22–32)
Calcium: 9 mg/dL (ref 8.9–10.3)
Chloride: 102 mmol/L (ref 98–111)
Creatinine, Ser: 1.58 mg/dL — ABNORMAL HIGH (ref 0.44–1.00)
GFR calc Af Amer: 35 mL/min — ABNORMAL LOW (ref >60)
GFR calc non Af Amer: 31 mL/min — ABNORMAL LOW (ref >60)
Glucose, Bld: 110 mg/dL — ABNORMAL HIGH (ref 70–99)
Potassium: 4 mmol/L (ref 3.5–5.1)
Sodium: 140 mmol/L (ref 135–145)

## 2019-10-27 LAB — BRAIN NATRIURETIC PEPTIDE: B Natriuretic Peptide: 50.3 pg/mL (ref 0.0–100.0)

## 2019-10-27 NOTE — Progress Notes (Signed)
ADVANCED HF CLINIC CONSULT NOTE  Primary Care: Pomposini, [name], MD EP: Dr Caryl Comes HF: Dr Haroldine [facility]: Dr. Radford Pax   HPI:  [name] is a 79 y.o. female with history of diastolic heart failure, DM, GERD, colon cancer, CKD stage III, 2016 pacemaker (MDT) due to symptomatic sinus node dysfunction.  In July 2019, she had 12 pound weight and she was sent to the ED by her PCP. She was sent home on metolazone every other day for 14 days. She took 4 doses and her creatinine went up to 3 so metolazone was stopped.   R/L cath in 9/19. Normal cors. Elevated filling pressures with pulmonary venous HTN.   Had f/u w/ Dr. Haroldine Laws 07/2019 and complained of chronic dyspnea w/ exertion. Stated she could barely walk due to SOB and LBP. Has had gradual wt gain over many years. She was 105 pounds when she graduated HS and current weight is ~ 220 lb. ReDs vest measurement was 34% and BNP was normal at 42. Based on assessment, it was felt obesity, sedentary lifestyle and increased adiposity was primary cause of her symptoms. She was encouraged to increase her physical activity and modify diet for wt loss. Home health PT was also ordered (not yet started,states she was never contacted). There were also several medication adjustments. Januvia was discontinued and she was started on Jardiance 10 mg daily. Hydralazine was also increased to 100 mg tid for better BP control. She had repeat labs 1 week after starting Jardiance and her renal function was stable, SCr ~2.0.   She presents back for f/u. Here w/ her daughter. Continues w/ chronic dyspnea and LBP. Dyspnea is not any worse. Denies CP. Continues w/ chronic LEE. Also reports long h/o lymphodema. Unable to get compression stockings on. Does not elevate legs much. She does not restrict liquids nor salt. Not very active at home. Reports full med compliance. On multiple meds for HTN. BP mildly elevated today but has not yet taken mid day dose of  hydralazine. She is requesting referral to see a new nephrologist. Previously followed in [place] but requesting change to provider here in [place].   Studies: PFTs 05/23/18: FEV1: 1.72 FEV1/FVC ratio: 77% DLCO: 70%  R/LHC 05/19/18: Ao = 192/74 (118) LV = 201/26 RA = 14 RV = 53/18 PA = 53/19 (38) PCW = 27 (v = 41) Fick cardiac output/index = 5.6/2.8 PVR = 2.0 WU Ao sat = 95% PA sat = 64%, 66% Assessment: 1. Normal coronaries with ectactic vessels suggestive of longstanding HTN 2. Severe HTN 3. Normal LV function 4. Signficantly elevated filling pressures with pulmonary venous HTN in setting of holding diuretics for 2 days Plan/Discussion: Filling pressures and BP elevated. Will resume diuretics. Will need aggressive titration of ant-HTN regimen.  Echo 05/14/18: - Left ventricle: Posterior basal and inferolateral hypokinesis The   cavity size was moderately dilated. Wall thickness was increased   in a pattern of mild LVH. Systolic function was normal. The   estimated ejection fraction was in the range of 50% to 55%. Left   ventricular diastolic function parameters were normal. - Aortic valve: Sclerosis without stenosis. - Mitral valve: Moderately calcified annulus. Moderately thickened,   mildly calcified leaflets . - Left atrium: The atrium was mildly dilated. - Atrial septum: No defect or patent foramen ovale was identified.   06/21/2015 Myoview   Nuclear stress EF: 64%.  There was no ST segment deviation noted during stress.  The study is normal. This is  a low risk study. No ischemia identified.  Sleep study 10/19: AHI 49   SH: Former 30-40 pack year smoker, quit 25 years ago.   Review of systems complete and found to be negative unless listed in HPI.   Past Medical History:  Diagnosis Date  . Anxiety   . Arthritis   . Cancer ([place])    colon  . Chronic kidney disease    stage 3  . Chronic pain syndrome   . Diabetes ([place])   . Diabetic neuropathy ([place])     . Dyslipidemia   . Early cataracts, bilateral   . Fibromyalgia   . GERD (gastroesophageal reflux disease)   . H/O syncope   . Heart murmur   . Hypertension   . Lumbar spinal stenosis    and scoliosis  . Pneumonia   . PONV (postoperative nausea and vomiting)   . Presence of permanent cardiac pacemaker   . Restless legs   . Spinal headache   . Thyroid nodule     Current Outpatient Medications  Medication Sig Dispense Refill  . ACCU-CHEK AVIVA PLUS test strip     . acetaminophen (TYLENOL) 650 MG CR tablet Take 1,300 mg by mouth every 8 (eight) hours.     . B-D UF III MINI PEN NEEDLES 31G X 5 MM MISC     . Cholecalciferol (VITAMIN D) 2000 UNITS tablet Take 2,000 Units by mouth daily.    . cyclobenzaprine (FLEXERIL) 10 MG tablet Take 10 mg by mouth 3 (three) times daily as needed for muscle spasms.    Marland Kitchen doxylamine, Sleep, (UNISOM) 25 MG tablet Take 25 mg by mouth at bedtime.    . DULoxetine (CYMBALTA) 20 MG capsule Take 20 mg by mouth daily.    . empagliflozin (JARDIANCE) 10 MG TABS tablet Take 10 mg by mouth daily before breakfast. 30 tablet 6  . glucose blood (ACCU-CHEK AVIVA PLUS) test strip TK TO TEST BLOOD SUGAR QD    . glucose blood (ACCU-CHEK AVIVA PLUS) test strip daily    . hydrALAZINE (APRESOLINE) 100 MG tablet Take 1 tablet (100 mg total) by mouth 3 (three) times daily. 90 tablet 6  . irbesartan (AVAPRO) 300 MG tablet Take 300 mg by mouth daily.  3  . labetalol (NORMODYNE) 200 MG tablet TAKE 1 TABLET(200 MG) BY MOUTH TWICE DAILY 180 tablet 3  . omeprazole (PRILOSEC OTC) 20 MG tablet Take 20 mg by mouth daily as needed.     . pramipexole (MIRAPEX) 0.125 MG tablet Take 0.25 mg by mouth 2 (two) times daily.    . rosuvastatin (CRESTOR) 10 MG tablet Take 10 mg by mouth daily.     Marland Kitchen torsemide (DEMADEX) 20 MG tablet Take 2 tablets (40 mg total) by mouth daily. 180 tablet 3  . traMADol (ULTRAM) 50 MG tablet Take 50 mg by mouth 3 (three) times daily as needed for moderate pain.      . TRESIBA FLEXTOUCH 200 UNIT/ML SOPN Inject 90 Units into the skin every morning.   5   No current facility-administered medications for this encounter.    Allergies  Allergen Reactions  . Penicillins Diarrhea and Other (See Comments)    Severe diarrhea Has patient had a PCN reaction causing immediate rash, facial/tongue/throat swelling, SOB or lightheadedness with hypotension: No Has patient had a PCN reaction causing severe rash involving mucus membranes or skin necrosis: No Has patient had a PCN reaction that required hospitalization No Has patient had a PCN reaction occurring within the  last 10 years: No If all of the above answers are "NO", then may proceed with Cephalosporin use.   . Codeine Other (See Comments)    Unknown  . Adhesive [Tape] Rash  . Aspirin Other (See Comments)    Stomach burning  . Iodine Itching, Swelling, Rash and Other (See Comments)    Can use if Benadryl and Prednisone are used first   . Ivp Dye [Iodinated Diagnostic Agents] Other (See Comments)    Can use if Benadryl and Prednisone are used first    . Sulfa Antibiotics Diarrhea      Social History   Socioeconomic History  . Marital status: Widowed    Spouse name: Not on file  . Number of children: Not on file  . Years of education: Not on file  . Highest education level: Not on file  Occupational History  . Not on file  Tobacco Use  . Smoking status: Former Smoker    Start date: 09/10/1953    Quit date: 09/11/1983    Years since quitting: 36.1  . Smokeless tobacco: Never Used  Substance and Sexual Activity  . Alcohol use: Yes    Alcohol/week: 0.0 standard drinks    Comment: rare glass of wine  . Drug use: No  . Sexual activity: Not on file  Other Topics Concern  . Not on file  Social History Narrative  . Not on file   Social Determinants of Health   Financial Resource Strain:   . Difficulty of Paying Living Expenses: Not on file  Food Insecurity:   . Worried About Sales executive in the Last Year: Not on file  . Ran Out of Food in the Last Year: Not on file  Transportation Needs:   . Lack of Transportation (Medical): Not on file  . Lack of Transportation (Non-Medical): Not on file  Physical Activity:   . Days of Exercise per Week: Not on file  . Minutes of Exercise per Session: Not on file  Stress:   . Feeling of Stress : Not on file  Social Connections:   . Frequency of Communication with Friends and Family: Not on file  . Frequency of Social Gatherings with Friends and Family: Not on file  . Attends Religious Services: Not on file  . Active Member of Clubs or Organizations: Not on file  . Attends Archivist Meetings: Not on file  . Marital Status: Not on file  Intimate Partner Violence:   . Fear of Current or Ex-Partner: Not on file  . Emotionally Abused: Not on file  . Physically Abused: Not on file  . Sexually Abused: Not on file      Family History  Problem Relation Age of Onset  . CAD Other   . Sick sinus syndrome Brother        has a PPM    Vitals:   10/27/19 1343  BP: (!) 148/70  Pulse: 65  SpO2: 98%  Weight: 100 kg (220 lb 6.4 oz)   Filed Weights   10/27/19 1343  Weight: 100 kg (220 lb 6.4 oz)     Wt Readings from Last 3 Encounters:  10/27/19 100 kg (220 lb 6.4 oz)  09/30/19 95.7 kg (211 lb)  07/13/19 101.7 kg (224 lb 3.2 oz)     PHYSICAL EXAM: General:  Well appearing elderly WF. No respiratory difficulty HEENT: normal Neck: supple. no JVD. Carotids 2+ bilat; no bruits. No lymphadenopathy or thyromegaly appreciated. Cor: PMI nondisplaced. Regular rate &  rhythm. No rubs, gallops or murmurs. Lungs: clear Abdomen: soft, nontender, nondistended. No hepatosplenomegaly. No bruits or masses. Good bowel sounds. Extremities: no cyanosis, clubbing, rash, edema Neuro: alert & oriented x 3, cranial nerves grossly intact. moves all 4 extremities w/o difficulty. Affect pleasant.   ASSESSMENT & PLAN:  1. Chronic  Diastolic Heart Failure  Echo 05/14/18: EF 50-55%, posterior basal and inferolateral HK, mild LVH, LA mildly dilated - R/LHC 05/19/18: normal coronaries, elevated filling pressure (had held diuretics x2 days), severe HTN. - Continues to struggled with NYHA IIIB dyspnea. She is extremely sedentary due to DOE and low back pain.  - Volume status not too bad today despite LE edema. Her wt is stable. Suspect LEE is primarily related to chronic venous insuffiency. Will check BNP today. Will also check BMP to reassess SCr.  - Continue torsemide 40 daily.   - Hold off on spiro with previous AKI - Encouraged use of compression stockings + frequent elevation - We discussed sodium/fluid restriction  - Encouraged physical activity. Will place new referral for HHPT  2. HTN - mildly elevated today in the 408X systolic but has not yet taken mid day dose of hydralazine. Per pt, home systolic BP averages in the 130s - continue hydral 100 tid - Avapro 300 mg qd - labetalol 200 mg bid - torsemide 40 mg qd - continue to monitor at home  3. OSA  - severe, AHI 49 by PSG in 10/19 but untreated.  - referred to Dr. Radford Pax. Visit completed 1/21. Pt refuses CPAP and per Dr. Radford Pax, her   OSA is not severe enough to qualify for the hypoglossal nerve stimulator.  4. Chronic Dyspnea - LHC 05/19/18 with normal coronaries - PFTs with DLCO 70% - suspect mostly due to obesity + OSA and HTN - will check BNP today    5. CKD Stage III-IV - check BMP today - refer to [facility]   6. Sinus node dysfunction s/p MDT PPM - Follows with Dr Caryl Comes  7. DM - continue Jardiance - check BMP today   9. Deconditioning - HHPT referral   10. Severe obesity - see discussion above - Body mass index is 38.43 kg/m. - encouraged physical activity/ wt loss    Lyda Jester, PA-C 4:52 PM

## 2019-10-27 NOTE — Patient Instructions (Signed)
Lab work done today. We will notify you of any abnormal lab work. No news is good news!  You have been referred to [facility].  Please follow up with the [facility] in 6 weeks.  At the [facility], you and your health needs are our priority. As part of our continuing mission to provide you with exceptional heart care, we have created designated Provider Care Teams. These Care Teams include your primary Cardiologist (physician) and Advanced Practice Providers (APPs- Physician Assistants and Nurse Practitioners) who all work together to provide you with the care you need, when you need it.   You may see any of the following providers on your designated Care Team at your next follow up: Marland Kitchen Dr Glori Bickers . Dr Loralie Champagne . Darrick Grinder, NP . Lyda Jester, PA . Audry Riles, PharmD   Please be sure to bring in all your medications bottles to every appointment.

## 2019-10-28 ENCOUNTER — Telehealth (HOSPITAL_COMMUNITY): Payer: Self-pay

## 2019-10-28 NOTE — Telephone Encounter (Signed)
Faxed referral to [facility]. Fax confirmation received.

## 2019-11-03 ENCOUNTER — Other Ambulatory Visit (HOSPITAL_COMMUNITY): Payer: Self-pay | Admitting: Internal Medicine

## 2019-11-04 ENCOUNTER — Other Ambulatory Visit: Payer: Self-pay | Admitting: Internal Medicine

## 2019-11-10 ENCOUNTER — Encounter: Payer: Medicare PPO | Admitting: Internal Medicine

## 2019-11-23 ENCOUNTER — Other Ambulatory Visit (HOSPITAL_COMMUNITY): Payer: Self-pay | Admitting: *Deleted

## 2019-11-23 ENCOUNTER — Telehealth (HOSPITAL_COMMUNITY): Payer: Self-pay | Admitting: *Deleted

## 2019-11-23 DIAGNOSIS — I5032 Chronic diastolic (congestive) heart failure: Secondary | ICD-10-CM

## 2019-11-23 NOTE — Telephone Encounter (Signed)
Order for home physical therapy faxed to [facility] at [phone].

## 2019-12-09 IMAGING — CR DG CHEST 2V
2 series · 2 of 2 positions shown · non-contrast
Comparison: July 22, 2015

CLINICAL DATA: Shortness of Breath

EXAM:
CHEST - 2 VIEW

[chest pa]
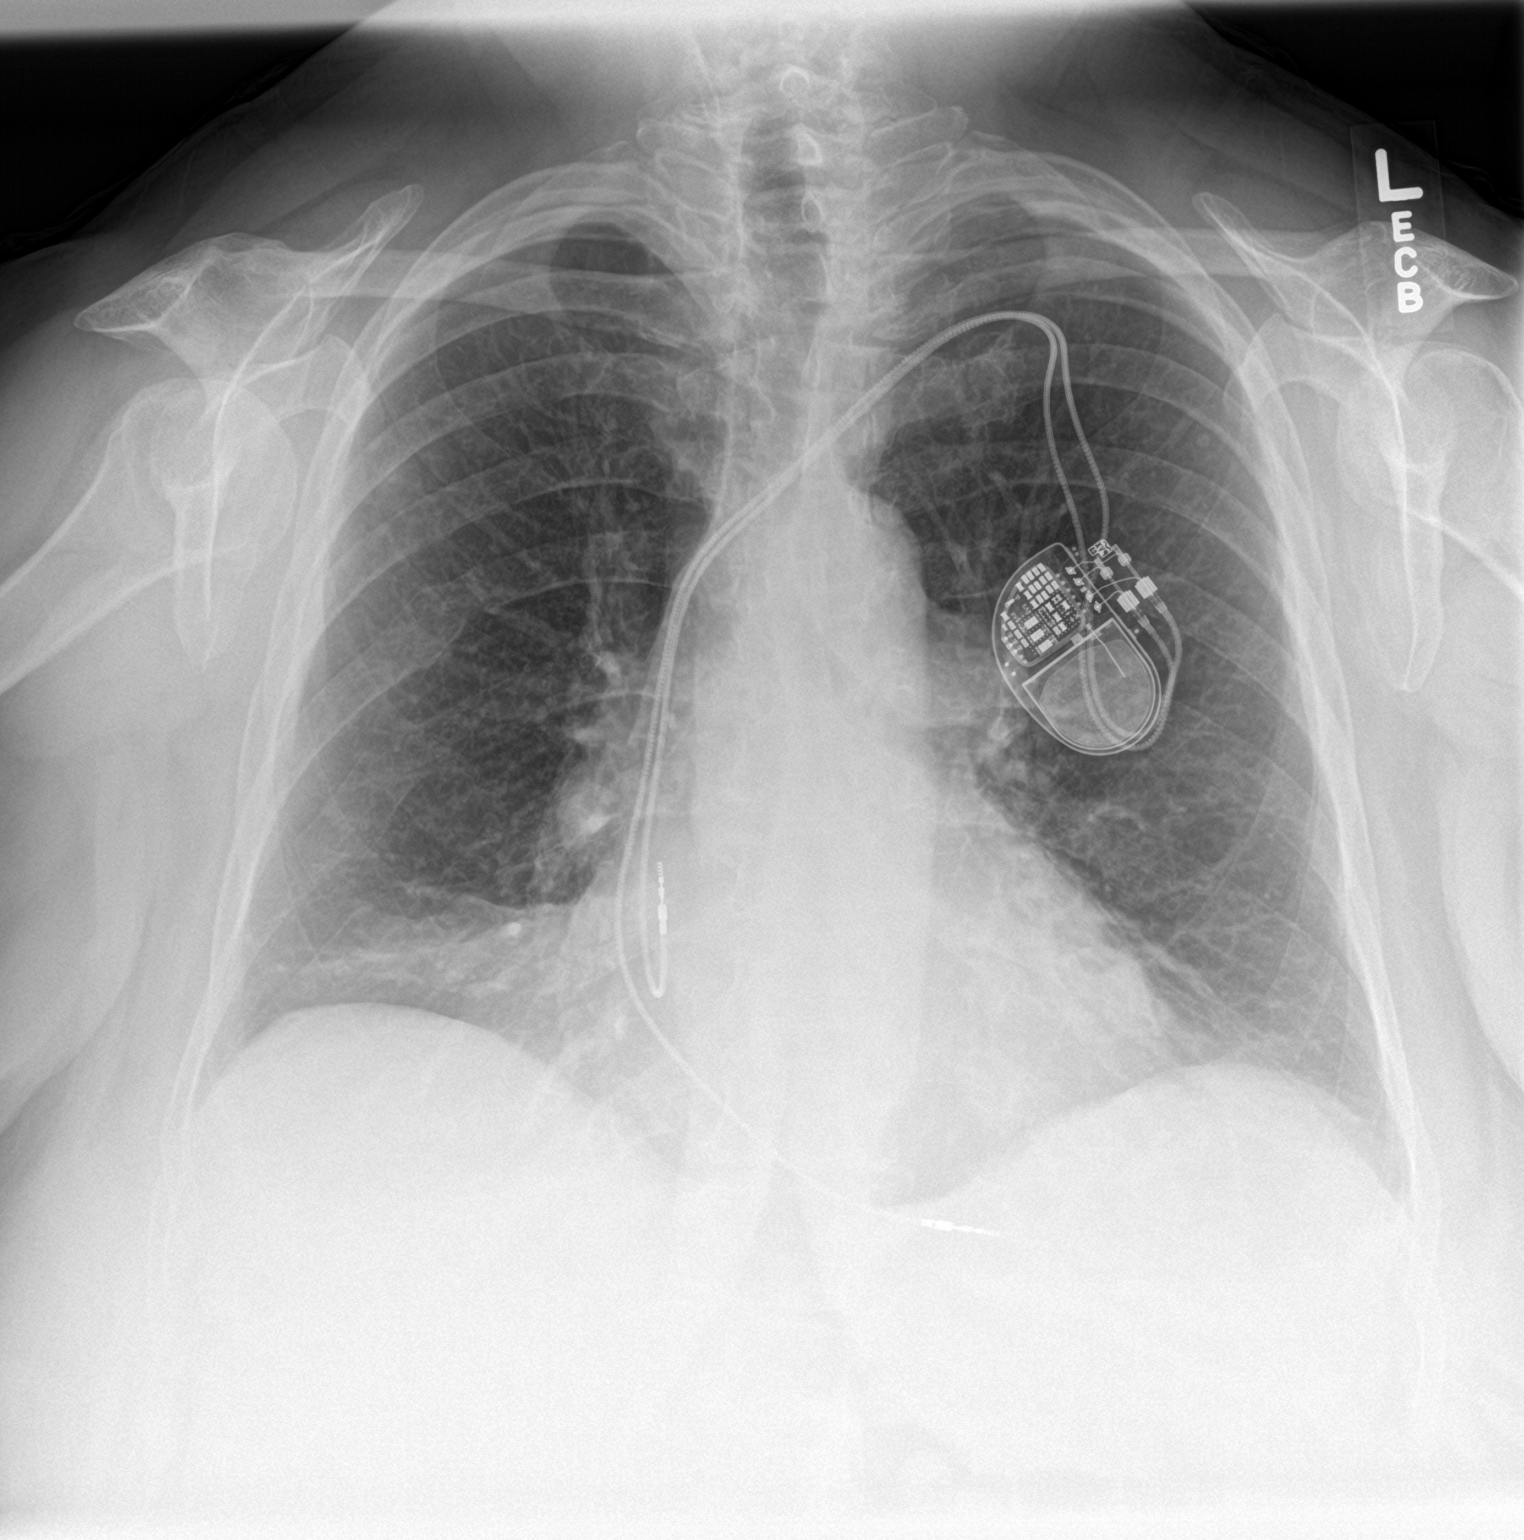

[chest lat]
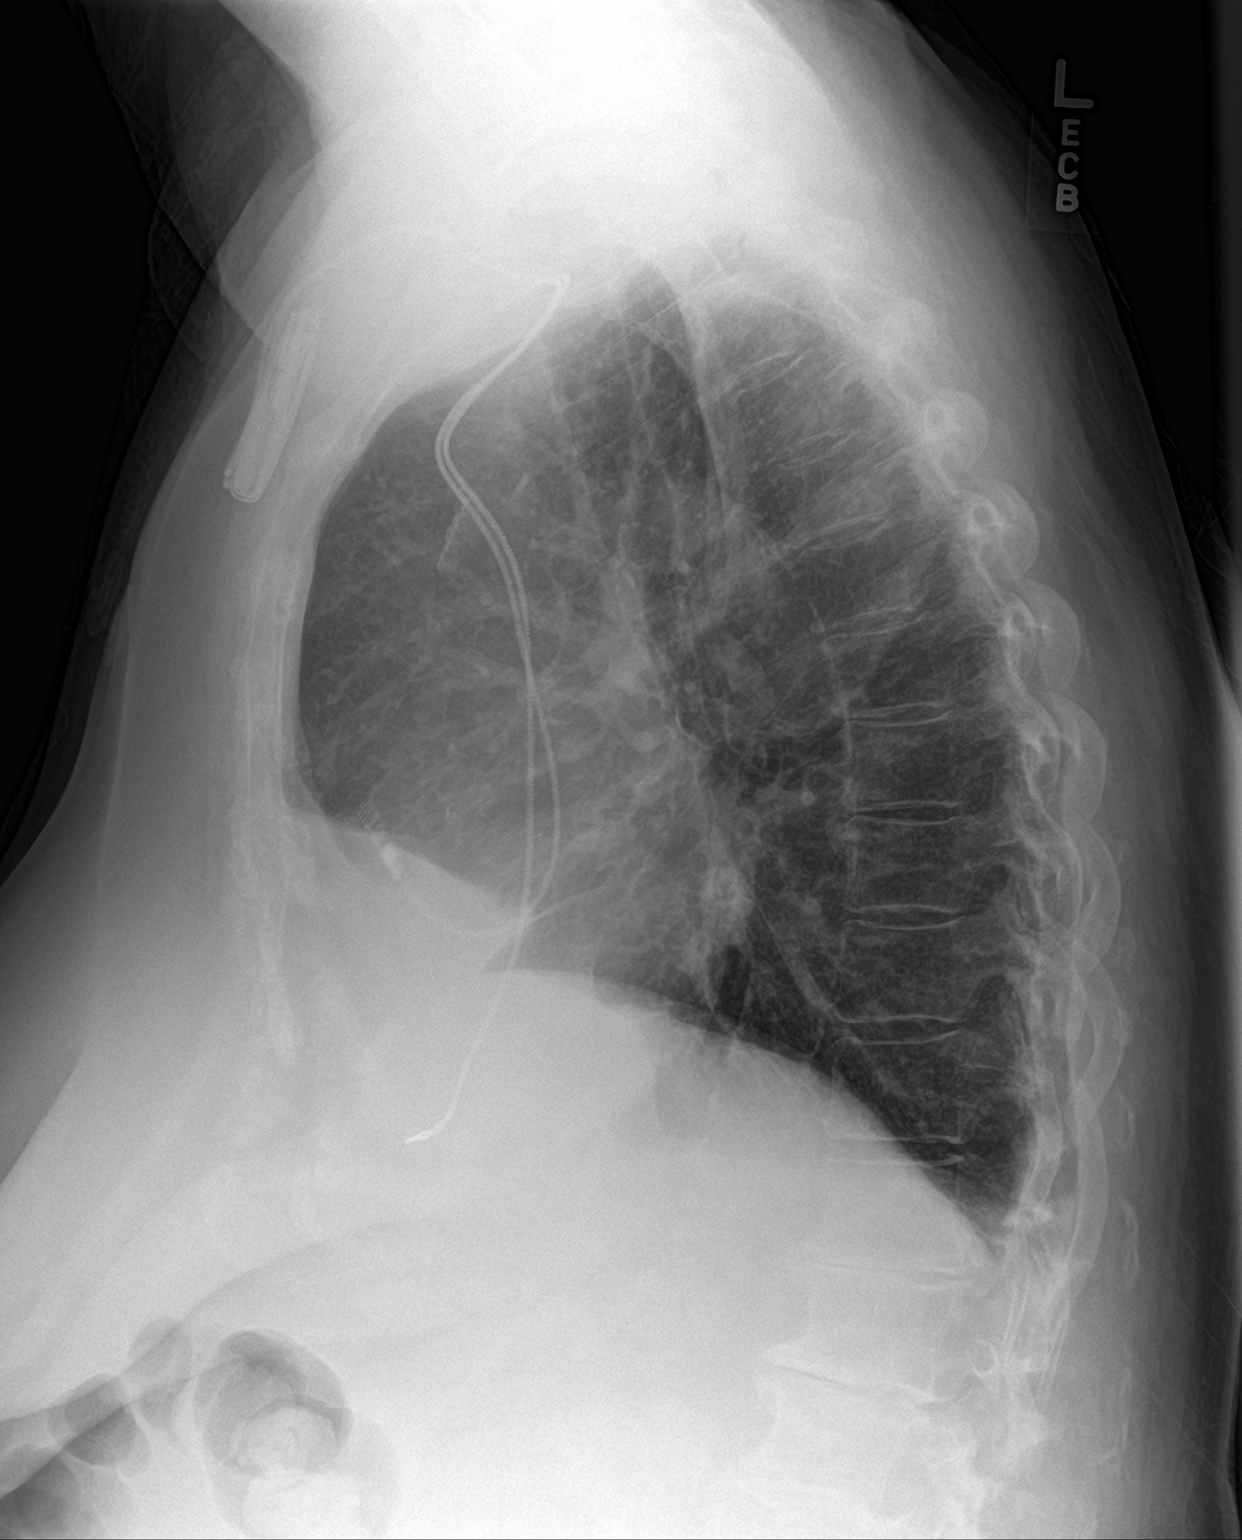

[2 of 2 positions shown; findings below may reference images not displayed]

FINDINGS: There is scarring in the bases, stable. There is no edema or
consolidation. Heart is upper normal in size with pulmonary
vascularity normal. Pacemaker leads are attached to the right atrium
and right ventricle. No adenopathy. There is degenerative change in
the lower thoracic and upper lumbar regions. There is aortic
atherosclerosis.
IMPRESSION: Mild bibasilar scarring. No edema or consolidation. Stable cardiac
silhouette. There is aortic atherosclerosis.

Aortic Atherosclerosis ([id number]).

## 2019-12-10 ENCOUNTER — Other Ambulatory Visit: Payer: Self-pay

## 2019-12-10 ENCOUNTER — Ambulatory Visit (HOSPITAL_COMMUNITY)
Admission: RE | Admit: 2019-12-10 | Discharge: 2019-12-10 | Disposition: A | Discharge status: Home / Self Care | Payer: Medicare PPO | Source: Ambulatory Visit | Attending: Internal Medicine | Admitting: Internal Medicine

## 2019-12-10 ENCOUNTER — Telehealth (HOSPITAL_COMMUNITY): Payer: Self-pay | Admitting: Pharmacist

## 2019-12-10 VITALS — BP 116/54 | HR 65 | Wt 222.2 lb

## 2019-12-10 DIAGNOSIS — I130 Hypertensive heart and chronic kidney disease with heart failure and stage 1 through stage 4 chronic kidney disease, or unspecified chronic kidney disease: Secondary | ICD-10-CM | POA: Diagnosis not present

## 2019-12-10 DIAGNOSIS — M545 Low back pain: Secondary | ICD-10-CM | POA: Diagnosis not present

## 2019-12-10 DIAGNOSIS — G4733 Obstructive sleep apnea (adult) (pediatric): Secondary | ICD-10-CM | POA: Insufficient documentation

## 2019-12-10 DIAGNOSIS — I10 Essential (primary) hypertension: Secondary | ICD-10-CM

## 2019-12-10 DIAGNOSIS — Z79899 Other long term (current) drug therapy: Secondary | ICD-10-CM | POA: Insufficient documentation

## 2019-12-10 DIAGNOSIS — E1136 Type 2 diabetes mellitus with diabetic cataract: Secondary | ICD-10-CM | POA: Insufficient documentation

## 2019-12-10 DIAGNOSIS — F40240 Claustrophobia: Secondary | ICD-10-CM | POA: Insufficient documentation

## 2019-12-10 DIAGNOSIS — E1140 Type 2 diabetes mellitus with diabetic neuropathy, unspecified: Secondary | ICD-10-CM | POA: Insufficient documentation

## 2019-12-10 DIAGNOSIS — Z87891 Personal history of nicotine dependence: Secondary | ICD-10-CM | POA: Diagnosis not present

## 2019-12-10 DIAGNOSIS — M1990 Unspecified osteoarthritis, unspecified site: Secondary | ICD-10-CM | POA: Insufficient documentation

## 2019-12-10 DIAGNOSIS — I5032 Chronic diastolic (congestive) heart failure: Secondary | ICD-10-CM | POA: Insufficient documentation

## 2019-12-10 DIAGNOSIS — G2581 Restless legs syndrome: Secondary | ICD-10-CM | POA: Insufficient documentation

## 2019-12-10 DIAGNOSIS — E785 Hyperlipidemia, unspecified: Secondary | ICD-10-CM | POA: Insufficient documentation

## 2019-12-10 DIAGNOSIS — N184 Chronic kidney disease, stage 4 (severe): Secondary | ICD-10-CM | POA: Insufficient documentation

## 2019-12-10 DIAGNOSIS — Z7984 Long term (current) use of oral hypoglycemic drugs: Secondary | ICD-10-CM | POA: Diagnosis not present

## 2019-12-10 DIAGNOSIS — R0609 Other forms of dyspnea: Secondary | ICD-10-CM | POA: Insufficient documentation

## 2019-12-10 DIAGNOSIS — N1832 Chronic kidney disease, stage 3b: Secondary | ICD-10-CM

## 2019-12-10 DIAGNOSIS — Z950 Presence of cardiac pacemaker: Secondary | ICD-10-CM | POA: Diagnosis not present

## 2019-12-10 DIAGNOSIS — E1122 Type 2 diabetes mellitus with diabetic chronic kidney disease: Secondary | ICD-10-CM | POA: Diagnosis not present

## 2019-12-10 DIAGNOSIS — G894 Chronic pain syndrome: Secondary | ICD-10-CM | POA: Diagnosis not present

## 2019-12-10 DIAGNOSIS — M797 Fibromyalgia: Secondary | ICD-10-CM | POA: Diagnosis not present

## 2019-12-10 DIAGNOSIS — Z6838 Body mass index (BMI) 38.0-38.9, adult: Secondary | ICD-10-CM | POA: Insufficient documentation

## 2019-12-10 DIAGNOSIS — K219 Gastro-esophageal reflux disease without esophagitis: Secondary | ICD-10-CM | POA: Insufficient documentation

## 2019-12-10 DIAGNOSIS — I495 Sick sinus syndrome: Secondary | ICD-10-CM | POA: Insufficient documentation

## 2019-12-10 LAB — BASIC METABOLIC PANEL
Anion gap: 12 (ref 5–15)
BUN: 57 mg/dL — ABNORMAL HIGH (ref 8–23)
CO2: 24 mmol/L (ref 22–32)
Calcium: 9.4 mg/dL (ref 8.9–10.3)
Chloride: 98 mmol/L (ref 98–111)
Creatinine, Ser: 1.8 mg/dL — ABNORMAL HIGH (ref 0.44–1.00)
GFR calc Af Amer: 30 mL/min — ABNORMAL LOW (ref >60)
GFR calc non Af Amer: 26 mL/min — ABNORMAL LOW (ref >60)
Glucose, Bld: 315 mg/dL — ABNORMAL HIGH (ref 70–99)
Potassium: 4.6 mmol/L (ref 3.5–5.1)
Sodium: 134 mmol/L — ABNORMAL LOW (ref 135–145)

## 2019-12-10 LAB — BRAIN NATRIURETIC PEPTIDE: B Natriuretic Peptide: 47.3 pg/mL (ref 0.0–100.0)

## 2019-12-10 MED ORDER — TORSEMIDE 20 MG PO TABS: 40.0000 mg | ORAL_TABLET | Freq: Every day | ORAL | 3 refills | Status: DC

## 2019-12-10 MED ORDER — JARDIANCE 10 MG PO TABS: 10.0000 mg | ORAL_TABLET | Freq: Every day | ORAL | Status: DC

## 2019-12-10 NOTE — Patient Instructions (Signed)
Restart Jardiance 10 mg daily, we will send in paperwork for patient assistance  If weight is 216 lb or greater take extra Torsemide at 2 PM  Labs done today, we will notify you for abnormal results  Please wear your compression hose daily, place them on as soon as you get up in the morning and remove before you go to bed at night.  Your physician recommends that you schedule a follow-up appointment in: 3-4 months  If you have any questions or concerns before your next appointment please send [place] a message through [place] or call our office at [phone].  At the [facility], you and your health needs are our priority. As part of our continuing mission to provide you with exceptional heart care, we have created designated Provider Care Teams. These Care Teams include your primary Cardiologist (physician) and Advanced Practice Providers (APPs- Physician Assistants and Nurse Practitioners) who all work together to provide you with the care you need, when you need it.   You may see any of the following providers on your designated Care Team at your next follow up: Marland Kitchen Dr Glori Bickers . Dr Loralie Champagne . Darrick Grinder, NP . Lyda Jester, PA . Audry Riles, PharmD   Please be sure to bring in all your medications bottles to every appointment.

## 2019-12-10 NOTE — Progress Notes (Signed)
Advanced Heart Failure Clinic Note   Date:  12/10/2019   ID:  [name], DOB [birth year], MRN [id number]  Location: Home  Provider location: [facility] Type of Visit: Established patient  PCP:  Pomposini, [name], MD  Cardiologist:  Fransico Him, MD Primary HF: Analayah Brooke  Chief Complaint: Heart Failure follow-up   History of Present Illness:  [name] is a 79 y.o. female with history of diastolic heart failure, DM, GERD, colon cancer, CKD stage III, 2016 pacemaker (MDT) due to symptomatic sinus node dysfunction.  In July 2019, she had 12 pound weight and she was sent to the ED by her PCP. She was sent home on metolazone every other day for 14 days. She took 4 doses and her creatinine went up to 3 so metolazone was stopped.   R/L cath in 9/19. Normal cors. Elevated filling pressures with pulmonary venous HTN.   She presents today with her daughter for routine follow-up. Says weight relatively stable. Runs 214-216. Occasionally with go down to 210-212. Still drinking a lot of fluid. + edema. No orthopnea or PND. Sleeps in recliner. Not wearing compression stockings. Had vein study which showed venous insufficiency SBP typically 140-150. Couldn't afford Jardiance so went back on Januvia.  Sleep study 10/19: AHI 49  ReDS vest today 34%  Studies: PFTs 05/23/18: FEV1: 1.72 FEV1/FVC ratio: 77% DLCO: 70%  R/LHC 05/19/18: Ao = 192/74 (118) LV = 201/26 RA = 14 RV = 53/18 PA = 53/19 (38) PCW = 27 (v = 41) Fick cardiac output/index = 5.6/2.8 PVR = 2.0 WU Ao sat = 95% PA sat = 64%, 66% Assessment: 1. Normal coronaries with ectactic vessels suggestive of longstanding HTN 2. Severe HTN 3. Normal LV function 4. Signficantly elevated filling pressures with pulmonary venous HTN in setting of holding diuretics for 2 days Plan/Discussion: Filling pressures and BP elevated. Will resume diuretics. Will need aggressive titration of ant-HTN  regimen.  Echo 05/14/18: - Left ventricle: Posterior basal and inferolateral hypokinesis The   cavity size was moderately dilated. Wall thickness was increased   in a pattern of mild LVH. Systolic function was normal. The   estimated ejection fraction was in the range of 50% to 55%. Left   ventricular diastolic function parameters were normal. - Aortic valve: Sclerosis without stenosis. - Mitral valve: Moderately calcified annulus. Moderately thickened,   mildly calcified leaflets . - Left atrium: The atrium was mildly dilated. - Atrial septum: No defect or patent foramen ovale was identified.   06/21/2015 Myoview   Nuclear stress EF: 64%.  There was no ST segment deviation noted during stress.  The study is normal.  This is a low risk study. No ischemia identified.  SH: Former 30-40 pack year smoker, quit 25 years ago.    [name] denies symptoms worrisome for COVID 19.   Past Medical History:  Diagnosis Date  . Anxiety   . Arthritis   . Cancer ([place])    colon  . Chronic kidney disease    stage 3  . Chronic pain syndrome   . Diabetes ([place])   . Diabetic neuropathy ([place])   . Dyslipidemia   . Early cataracts, bilateral   . Fibromyalgia   . GERD (gastroesophageal reflux disease)   . H/O syncope   . Heart murmur   . Hypertension   . Lumbar spinal stenosis    and scoliosis  . Pneumonia   . PONV (postoperative nausea and vomiting)   .  Presence of permanent cardiac pacemaker   . Restless legs   . Spinal headache   . Thyroid nodule    Past Surgical History:  Procedure Laterality Date  . APPENDECTOMY    . BREAST SURGERY     lumpectomy  . COLON SURGERY    . DILATION AND CURETTAGE OF UTERUS    . EP IMPLANTABLE DEVICE N/A 07/21/2015   Procedure: Pacemaker Implant;  Surgeon: Deboraha Sprang, MD;  Location: [facility];  Service: Cardiovascular;  Laterality: N/A;  . LUMBAR LAMINECTOMY/DECOMPRESSION MICRODISCECTOMY N/A 02/02/2016   Procedure: DECOMPRESSION  L4-L5 WITH INSITE 2 FUSION ;  Surgeon: Melina Schools, MD;  Location: [facility];  Service: Orthopedics;  Laterality: N/A;  . PARTIAL NEPHRECTOMY    . RIGHT/LEFT HEART CATH AND CORONARY ANGIOGRAPHY N/A 05/19/2018   Procedure: RIGHT/LEFT HEART CATH AND CORONARY ANGIOGRAPHY;  Surgeon: Jolaine Artist, MD;  Location: [facility];  Service: Cardiovascular;  Laterality: N/A;  . TONSILLECTOMY    . TUBAL LIGATION       Current Outpatient Medications  Medication Sig Dispense Refill  . ACCU-CHEK AVIVA PLUS test strip     . acetaminophen (TYLENOL) 650 MG CR tablet Take 1,300 mg by mouth every 8 (eight) hours.     . B-D UF III MINI PEN NEEDLES 31G X 5 MM MISC     . Cholecalciferol (VITAMIN D) 2000 UNITS tablet Take 2,000 Units by mouth daily.    . cyclobenzaprine (FLEXERIL) 10 MG tablet Take 10 mg by mouth 3 (three) times daily as needed for muscle spasms.    Marland Kitchen doxylamine, Sleep, (UNISOM) 25 MG tablet Take 25 mg by mouth at bedtime.    . DULoxetine (CYMBALTA) 20 MG capsule Take 20 mg by mouth daily.    Marland Kitchen glucose blood (ACCU-CHEK AVIVA PLUS) test strip TK TO TEST BLOOD SUGAR QD    . glucose blood (ACCU-CHEK AVIVA PLUS) test strip daily    . hydrALAZINE (APRESOLINE) 100 MG tablet Take 1 tablet (100 mg total) by mouth 3 (three) times daily. 90 tablet 6  . irbesartan (AVAPRO) 300 MG tablet Take 300 mg by mouth daily.  3  . labetalol (NORMODYNE) 200 MG tablet TAKE 1 TABLET(200 MG) BY MOUTH TWICE DAILY 180 tablet 0  . omeprazole (PRILOSEC OTC) 20 MG tablet Take 20 mg by mouth daily as needed.     . pramipexole (MIRAPEX) 0.125 MG tablet Take 0.25 mg by mouth 2 (two) times daily.    . rosuvastatin (CRESTOR) 10 MG tablet Take 10 mg by mouth daily.     . sitaGLIPtin (JANUVIA) 50 MG tablet Take 50 mg by mouth daily.    Marland Kitchen torsemide (DEMADEX) 20 MG tablet Take 2 tablets (40 mg total) by mouth daily. 180 tablet 3  . traMADol (ULTRAM) 50 MG tablet Take 50 mg by mouth 3 (three) times daily as needed for moderate  pain.      No current facility-administered medications for this encounter.    Allergies:   Penicillins, Codeine, Adhesive [tape], Aspirin, Iodine, Ivp dye [iodinated diagnostic agents], and Sulfa antibiotics   Social History:  The patient  reports that she quit smoking about 36 years ago. She started smoking about 66 years ago. She has never used smokeless tobacco. She reports current alcohol use. She reports that she does not use drugs.   Family History:  The patient's family history includes CAD in an other family member; Sick sinus syndrome in her brother.   ROS:  Please  see the history of present illness.   All other systems are personally reviewed and negative.   Vitals:   12/10/19 1141  BP: (!) 116/54  Pulse: 65  SpO2: 96%  Weight: 100.8 kg (222 lb 4 oz)      Exam:   General:  Obese woman. No resp difficulty HEENT: normal Neck: supple. no JVD. Carotids 2+ bilat; no bruits. No lymphadenopathy or thryomegaly appreciated. Cor: PMI nondisplaced. Regular rate & rhythm. No rubs, gallops or murmurs. Lungs: clear Abdomen: obese  soft, nontender, nondistended. No hepatosplenomegaly. No bruits or masses. Good bowel sounds. Extremities: no cyanosis, clubbing, rash, 2+ edema Neuro: alert & orientedx3, cranial nerves grossly intact. moves all 4 extremities w/o difficulty. Affect pleasant   Recent Labs: 10/27/2019: B Natriuretic Peptide 50.3; BUN 36; Creatinine, Ser 1.58; Potassium 4.0; Sodium 140  Personally reviewed   Wt Readings from Last 3 Encounters:  12/10/19 100.8 kg (222 lb 4 oz)  10/27/19 100 kg (220 lb 6.4 oz)  09/30/19 95.7 kg (211 lb)      ASSESSMENT AND PLAN:  1. Chronic Diastolic Heart Failure  - Echo 05/14/18: EF 50-55%, posterior basal and inferolateral HK, mild LVH, LA mildly dilated - R/LHC 05/19/18: normal coronaries, elevated filling pressure (had held diuretics x2 days), severe HTN. - Stable NYHA III- IIIB dyspnea. She is extremely sedentary due to DOE and  low back pain.  - Volume status not too bad. Main issues is venous insufficience - Continue torsemide 40 daily for now. Can take extra 20 in afternoon if weight 216 or greater. Limit fluid intake - Absolutely needs to wear compression hose. We went over use of a stocking donner.  - Hold off on spiro with previous AKI - Continue compression hose - Resume Jardiance  2. HTN, severe, - high at home but ok here - start Jardiance - can add Cardura as needed  3. OSA  - severe, AHI 49 by PSG in 10/19 - f/u testing AHI 11 - Had televisit with Dr. Radford Pax on 09/30/19. She refused CPAP due to claustrophobia.  - Continues to refuse   4. Dyspnea - LHC 05/19/18 with normal coronaries - PFTs with DLCO 70% - suspect mostly due to obesity + OSA and HTN  5. CKD Stage III-IV - Creatinine 1.6 in 2/21.  - Recheck today - Has appt with [facility]  6. Sinus node dysfunction s/p MDT PPM - Follows with Dr Caryl Comes - Good HR variation with walking around clinic last visit 64 resting to 95 bpm walking  7. DM - Resume Jardiance   8. Severe obesity - stressed need for more activity and [place] Body mass index is 38.75 kg/m.   Glori Bickers, MD  12/10/2019 12:27 PM  Advanced Heart Failure [facility] [address] [facility] [address] [place] [address] [phone] (office) [phone] (fax)

## 2019-12-10 NOTE — Telephone Encounter (Signed)
Sent in Manufacturer's Assistance application to [employer] for Jardiance.   Application pending, will continue to follow.  Chenille Toor, PharmD, BCPS, BCCP, CPP Heart Failure Clinic Pharmacist [phone]  

## 2019-12-14 ENCOUNTER — Telehealth (HOSPITAL_COMMUNITY): Payer: Self-pay | Admitting: Licensed Clinical Social Worker

## 2019-12-14 NOTE — Telephone Encounter (Signed)
Advanced Heart Failure Patient Advocate Encounter   Patient was denied to receive Jardiance from [facility]. The representative informed me that they are requiring that she apply for Medicare Low Income Subsidy. If she is denied for the Medicare Low Income Subsidy, then they will re-process her application. They will send her a one-time 90 day supply of the Jardiance while she applies for Medicare Low Income Subsidy. Social Work will call her and explain the next steps.   Audry Riles, PharmD, BCPS, BCCP, CPP Heart Failure Clinic Pharmacist [phone]

## 2019-12-14 NOTE — Telephone Encounter (Signed)
CSW informed by clinic pharmacist that pt has been denied for [employer] PAP to help with Jardiance until she applies for Extra Help program.  Pharmacist consulted CSW to reach out to pt to assist in applying.  CSW called pt and helped to complete online application- should hear in 3-4 weeks regarding determination.  CSW will continue to follow and assist as needed  Jorge Ny, [facility] Desk#: [phone] Cell#: [phone]

## 2019-12-15 ENCOUNTER — Ambulatory Visit (INDEPENDENT_AMBULATORY_CARE_PROVIDER_SITE_OTHER): Payer: Medicare PPO | Admitting: *Deleted

## 2019-12-15 ENCOUNTER — Telehealth: Payer: Self-pay | Admitting: *Deleted

## 2019-12-15 DIAGNOSIS — I495 Sick sinus syndrome: Secondary | ICD-10-CM | POA: Diagnosis not present

## 2019-12-15 LAB — CUP PACEART REMOTE DEVICE CHECK
Battery Remaining Longevity: 77 mo
Battery Voltage: 3.01 V
Brady Statistic AP VP Percent: 0.66 %
Brady Statistic AP VS Percent: 94.82 %
Brady Statistic AS VP Percent: 0 %
Brady Statistic AS VS Percent: 4.53 %
Brady Statistic RA Percent Paced: 95.26 %
Brady Statistic RV Percent Paced: 0.67 %
Date Time Interrogation Session: 20210406081437
Implantable Lead Implant Date: 20161110
Implantable Lead Implant Date: 20161110
Implantable Lead Location: 753859
Implantable Lead Location: 753860
Implantable Lead Model: 5076
Implantable Lead Model: 5076
Implantable Pulse Generator Implant Date: 20161110
Lead Channel Impedance Value: 399 Ohm
Lead Channel Impedance Value: 437 Ohm
Lead Channel Impedance Value: 475 Ohm
Lead Channel Impedance Value: 532 Ohm
Lead Channel Pacing Threshold Amplitude: 0.5 V
Lead Channel Pacing Threshold Amplitude: 0.875 V
Lead Channel Pacing Threshold Pulse Width: 0.4 ms
Lead Channel Pacing Threshold Pulse Width: 0.4 ms
Lead Channel Sensing Intrinsic Amplitude: 3.875 mV
Lead Channel Sensing Intrinsic Amplitude: 3.875 mV
Lead Channel Sensing Intrinsic Amplitude: 8.75 mV
Lead Channel Sensing Intrinsic Amplitude: 8.75 mV
Lead Channel Setting Pacing Amplitude: 1.5 V
Lead Channel Setting Pacing Amplitude: 2 V
Lead Channel Setting Pacing Pulse Width: 0.4 ms
Lead Channel Setting Sensing Sensitivity: 2.8 mV

## 2019-12-15 NOTE — Telephone Encounter (Signed)
Accelerated junctional rhythm noted on 12/15/19 PPM transmission. Some ventricular events functionally undersensed due to timing of AP events. Per Dr. Caryl Comes, plan to bring patient in for base rate reprogramming. Noted after calling pt that she is scheduled with Dr. Caryl Comes on 12/21/19. Advised on message (okay per DPR) that call was made in error, provided upcoming appointment info.

## 2019-12-16 NOTE — Progress Notes (Signed)
PPM Remote  

## 2019-12-21 ENCOUNTER — Encounter: Payer: Self-pay | Admitting: Internal Medicine

## 2019-12-21 ENCOUNTER — Other Ambulatory Visit: Payer: Self-pay

## 2019-12-21 ENCOUNTER — Ambulatory Visit: Payer: Medicare PPO | Admitting: Internal Medicine

## 2019-12-21 VITALS — BP 138/62 | HR 87 | Ht 63.0 in | Wt 217.0 lb

## 2019-12-21 DIAGNOSIS — I495 Sick sinus syndrome: Secondary | ICD-10-CM | POA: Diagnosis not present

## 2019-12-21 DIAGNOSIS — Z959 Presence of cardiac and vascular implant and graft, unspecified: Secondary | ICD-10-CM | POA: Diagnosis not present

## 2019-12-21 MED ORDER — CEPHALEXIN 500 MG PO CAPS: 500.0000 mg | ORAL_CAPSULE | Freq: Three times a day (TID) | ORAL | 0 refills | Status: DC

## 2019-12-21 NOTE — Progress Notes (Signed)
Patient Care Team: Pomposini, [name], MD as PCP - General (Internal Medicine) Sueanne Margarita, MD as PCP - Cardiology (Cardiology)   HPI  [name] is a 79 y.o. female Seen in followup for pacemaker implanted 2016 for symptomatic sinus node dysfunction    Saw Dr. Reine Just.  Evaluation included catheterization echocardiogram and sleep studies as noted.  Efforts to augment diuresis were complicated by worsening creatinine  Continues to struggle with dyspnea and peripheral edema.  Diuretics being managed by the heart failure clinic  DATE TEST EF   9/19 Echo    50-55 % LVH mild  9/19 * LHC 55-65 Cors nonobstructive          Date Cr K Hgb  5/17 1.16 4.5   7/19 1.83    7/19 3.07    9/19  1.45 4.4 11.7  3/20 1.69 4.5   4/21 1.8 4.6        Past Medical History:  Diagnosis Date  . Anxiety   . Arthritis   . Cancer ([place])    colon  . Chronic kidney disease    stage 3  . Chronic pain syndrome   . Diabetes ([place])   . Diabetic neuropathy ([place])   . Dyslipidemia   . Early cataracts, bilateral   . Fibromyalgia   . GERD (gastroesophageal reflux disease)   . H/O syncope   . Heart murmur   . Hypertension   . Lumbar spinal stenosis    and scoliosis  . Pneumonia   . PONV (postoperative nausea and vomiting)   . Presence of permanent cardiac pacemaker   . Restless legs   . Spinal headache   . Thyroid nodule     Past Surgical History:  Procedure Laterality Date  . APPENDECTOMY    . BREAST SURGERY     lumpectomy  . COLON SURGERY    . DILATION AND CURETTAGE OF UTERUS    . EP IMPLANTABLE DEVICE N/A 07/21/2015   Procedure: Pacemaker Implant;  Surgeon: Deboraha Sprang, MD;  Location: [facility];  Service: Cardiovascular;  Laterality: N/A;  . LUMBAR LAMINECTOMY/DECOMPRESSION MICRODISCECTOMY N/A 02/02/2016   Procedure: DECOMPRESSION L4-L5 WITH INSITE 2 FUSION ;  Surgeon: Melina Schools, MD;  Location: [facility];  Service: Orthopedics;  Laterality: N/A;  . PARTIAL  NEPHRECTOMY    . RIGHT/LEFT HEART CATH AND CORONARY ANGIOGRAPHY N/A 05/19/2018   Procedure: RIGHT/LEFT HEART CATH AND CORONARY ANGIOGRAPHY;  Surgeon: Jolaine Artist, MD;  Location: [facility];  Service: Cardiovascular;  Laterality: N/A;  . TONSILLECTOMY    . TUBAL LIGATION      Current Outpatient Medications  Medication Sig Dispense Refill  . ACCU-CHEK AVIVA PLUS test strip     . acetaminophen (TYLENOL) 650 MG CR tablet Take 1,300 mg by mouth every 8 (eight) hours.     . B-D UF III MINI PEN NEEDLES 31G X 5 MM MISC     . Cholecalciferol (VITAMIN D) 2000 UNITS tablet Take 2,000 Units by mouth daily.    . cyclobenzaprine (FLEXERIL) 10 MG tablet Take 10 mg by mouth 3 (three) times daily as needed for muscle spasms.    Marland Kitchen doxylamine, Sleep, (UNISOM) 25 MG tablet Take 25 mg by mouth at bedtime.    . DULoxetine (CYMBALTA) 20 MG capsule Take 20 mg by mouth daily.    . empagliflozin (JARDIANCE) 10 MG TABS tablet Take 10 mg by mouth daily before breakfast. 30 tablet   . glucose  blood (ACCU-CHEK AVIVA PLUS) test strip TK TO TEST BLOOD SUGAR QD    . glucose blood (ACCU-CHEK AVIVA PLUS) test strip daily    . hydrALAZINE (APRESOLINE) 100 MG tablet Take 1 tablet (100 mg total) by mouth 3 (three) times daily. 90 tablet 6  . irbesartan (AVAPRO) 300 MG tablet Take 300 mg by mouth daily.  3  . labetalol (NORMODYNE) 200 MG tablet TAKE 1 TABLET(200 MG) BY MOUTH TWICE DAILY 180 tablet 0  . omeprazole (PRILOSEC OTC) 20 MG tablet Take 20 mg by mouth daily as needed.     . pramipexole (MIRAPEX) 0.125 MG tablet Take 0.25 mg by mouth 2 (two) times daily.    . rosuvastatin (CRESTOR) 10 MG tablet Take 10 mg by mouth daily.     . sitaGLIPtin (JANUVIA) 50 MG tablet Take 50 mg by mouth daily.    Marland Kitchen torsemide (DEMADEX) 20 MG tablet Take 2 tablets (40 mg total) by mouth daily. Take extra at 2pm if weight is 216 or greater 180 tablet 3  . traMADol (ULTRAM) 50 MG tablet Take 50 mg by mouth 3 (three) times daily as  needed for moderate pain.      No current facility-administered medications for this visit.    Allergies  Allergen Reactions  . Penicillins Diarrhea and Other (See Comments)    Severe diarrhea Has patient had a PCN reaction causing immediate rash, facial/tongue/throat swelling, SOB or lightheadedness with hypotension: No Has patient had a PCN reaction causing severe rash involving mucus membranes or skin necrosis: No Has patient had a PCN reaction that required hospitalization No Has patient had a PCN reaction occurring within the last 10 years: No If all of the above answers are "NO", then may proceed with Cephalosporin use.   . Codeine Other (See Comments)    Unknown  . Adhesive [Tape] Rash  . Aspirin Other (See Comments)    Stomach burning  . Iodine Itching, Swelling, Rash and Other (See Comments)    Can use if Benadryl and Prednisone are used first   . Ivp Dye [Iodinated Diagnostic Agents] Other (See Comments)    Can use if Benadryl and Prednisone are used first    . Sulfa Antibiotics Diarrhea      Review of Systems negative except from HPI and PMH  Physical Exam BP 138/62   Pulse 87   Ht 5\' 3"  (1.6 m)   Wt 217 lb (98.4 kg)   SpO2 96%   BMI 38.44 kg/m  Well developed and well nourished in no acute distress HENT normal Neck supple with JVP-flat Clear Device pocket well healed; without hematoma or erythema.  There is no tethering  Regular rate and rhythm, no  murmur Abd-soft with active BS No Clubbing cyanosis 2+  Edema erythema left leg Skin-warm and dry A & Oriented  Grossly normal sensory and motor function  ECG atrial pacing   Sinus node dysfunction-symptomatic  Pacemaker-Medtronic  The patient's device was interrogated.  The information was reviewed. No changes were made in the programming.     Obstructive sleep apnea  Dyspnea on exertion  Hypertension bilateral peripheral edema  Cellulitis  Morbid obesity  HFpEF  Renal insufficiency 3-4   ( Cr 3.07 -1.65)     The patient has lower extremity cellulitis.  Her allergy with penicillin is remote and was related to diarrhea.  No evidence of anaphylactic reactions.  Reviewing up-to-date, and the [facility] at [facility], severe or more recent drug reactions would prompt the use of  clindamycin as opposed to cephalosporin.  We will give her the prescription by hand and she will check with the wound clinic tomorrow in [place] to confirm as to whether she should be on an antibiotic.   Blood pressure reasonably controlled  She remains volume overloaded.  Will defer to Dr. Haroldine Laws.  Sinus node incompetence with heart rate excursion appropriate based on her histograms.

## 2019-12-21 NOTE — Patient Instructions (Signed)
Medication Instructions:  Your physician has recommended you make the following change in your medication:   ** Keflex 500mg  1 capsule every 8 hours x 4 days #12/0RF - printed RX given to pt and will review with the Wound Clinic prior to taking.   Labwork: None ordered.  Testing/Procedures: None ordered.  Follow-Up: Your physician wants you to follow-up in: 12 months with Dr Caryl Comes. You will receive a reminder letter in the mail two months in advance. If you don't receive a letter, please call our office to schedule the follow-up appointment.  Remote monitoring is used to monitor your Pacemaker of ICD from home. This monitoring reduces the number of office visits required to check your device to one time per year. It allows [place] to keep an eye on the functioning of your device to ensure it is working properly.  Any Other Special Instructions Will Be Listed Below (If Applicable).  If you need a refill on your cardiac medications before your next appointment, please call your pharmacy.

## 2019-12-22 ENCOUNTER — Telehealth: Payer: Self-pay | Admitting: Internal Medicine

## 2019-12-22 ENCOUNTER — Telehealth: Payer: Self-pay

## 2019-12-22 NOTE — Telephone Encounter (Signed)
Pt seen in office for PM check and reprogramming.  Noted after visit, device needs to be reprogrammed to increase Lower rate to 70bpm.  Confirmed with Dr. Caryl Comes.  Ha agreed with bring patient back to clinic for device reprogramming.   Spoke with pt, she is agreeable to f/u visit.  Sending to scheduling.

## 2019-12-22 NOTE — Telephone Encounter (Signed)
Patient states she is calling to inquire about whether or not Dr. Caryl Comes is able to perform an ablation. Please advise.

## 2019-12-24 ENCOUNTER — Other Ambulatory Visit: Payer: Self-pay | Admitting: Internal Medicine

## 2019-12-26 ENCOUNTER — Other Ambulatory Visit: Payer: Self-pay | Admitting: Internal Medicine

## 2019-12-28 MED ORDER — TORSEMIDE 20 MG PO TABS: 40.0000 mg | ORAL_TABLET | Freq: Every day | ORAL | 3 refills | Status: DC

## 2019-12-28 NOTE — Telephone Encounter (Signed)
Spoke with pt. Pt states wound care center recommended she have an ablation of the veins in her legs.  Advised pt Dr Caryl Comes only does Aflutter and AV ablations of the heart.  Pt advised to seek recommendation from the [facility] or her PCP for vascular provider.  Pt verbalizes understanding and agrees with current plan.

## 2019-12-28 NOTE — Progress Notes (Deleted)
Cardiology Office Note Date:  12/28/2019  Patient ID:  [name], DOB 11-Jun-1941, MRN [id number] PCP:  [name] Quant, MD  Cardiologist:  Dr. Jacinta Shoe AHF: Dr. Haroldine Laws Electrophysiologist: Dr. Caryl Comes   Chief Complaint: *** increase base pacing rate  History of Present Illness: [name] is a 79 y.o. female with history of HLD, DM, HTN, orthostatic syncope remotely, and symptomatic bradycardia s/p PPM, chronic CHF (diastolic), OSA, morbid obesity, CKD (III-IV), chronic venous insufficiency.  She saw Dr. Haroldine Laws 12/10/2019, [name] was added and cardura as need for HTN control.  Suspect her DOE mostly 2/2 obesity (+OSA and HTN).  She was due to see [employer].  She comes in today to be seen for Dr. Caryl Comes, last seen by him 12/21/2019.  AT that time discussed starting antibiotic tx for LE cellulitis, she was going to see wound clinic the following day and get started if they felt required. Keflex 500mg  1 capsule every 8 hours x 4 days #12 A phone note mentions wound care center recommended ablation of her LE veins.  Device clinic on a remote had noted the following day mentions noting accel junctional rhythm, Dr. Caryl Comes recommended she be brought in to increase her base pacing rate to 70 (unfortunately not done at the time of his visit).  *** volume *** ?antibiotics? *** labs (Dr. B) done    Device information MDT dual chamber PPM implanted 07/21/2015  Past Medical History:  Diagnosis Date  . Anxiety   . Arthritis   . Cancer ([place])    colon  . Chronic kidney disease    stage 3  . Chronic pain syndrome   . Diabetes ([place])   . Diabetic neuropathy ([place])   . Dyslipidemia   . Early cataracts, bilateral   . Fibromyalgia   . GERD (gastroesophageal reflux disease)   . H/O syncope   . Heart murmur   . Hypertension   . Lumbar spinal stenosis    and scoliosis  . Pneumonia   . PONV (postoperative nausea and vomiting)   . Presence of permanent cardiac pacemaker    . Restless legs   . Spinal headache   . Thyroid nodule     Past Surgical History:  Procedure Laterality Date  . APPENDECTOMY    . BREAST SURGERY     lumpectomy  . COLON SURGERY    . DILATION AND CURETTAGE OF UTERUS    . EP IMPLANTABLE DEVICE N/A 07/21/2015   Procedure: Pacemaker Implant;  Surgeon: Deboraha Sprang, MD;  Location: [facility];  Service: Cardiovascular;  Laterality: N/A;  . LUMBAR LAMINECTOMY/DECOMPRESSION MICRODISCECTOMY N/A 02/02/2016   Procedure: DECOMPRESSION L4-L5 WITH INSITE 2 FUSION ;  Surgeon: Melina Schools, MD;  Location: [facility];  Service: Orthopedics;  Laterality: N/A;  . PARTIAL NEPHRECTOMY    . RIGHT/LEFT HEART CATH AND CORONARY ANGIOGRAPHY N/A 05/19/2018   Procedure: RIGHT/LEFT HEART CATH AND CORONARY ANGIOGRAPHY;  Surgeon: Jolaine Artist, MD;  Location: [facility];  Service: Cardiovascular;  Laterality: N/A;  . TONSILLECTOMY    . TUBAL LIGATION      Current Outpatient Medications  Medication Sig Dispense Refill  . ACCU-CHEK AVIVA PLUS test strip     . acetaminophen (TYLENOL) 650 MG CR tablet Take 1,300 mg by mouth every 8 (eight) hours.     . B-D UF III MINI PEN NEEDLES 31G X 5 MM MISC     . cephALEXin (KEFLEX) 500 MG capsule Take 1 capsule (500 mg total) by  mouth every 8 (eight) hours. 12 capsule 0  . Cholecalciferol (VITAMIN D) 2000 UNITS tablet Take 2,000 Units by mouth daily.    . cyclobenzaprine (FLEXERIL) 10 MG tablet Take 10 mg by mouth 3 (three) times daily as needed for muscle spasms.    Marland Kitchen doxylamine, Sleep, (UNISOM) 25 MG tablet Take 25 mg by mouth at bedtime.    . DULoxetine (CYMBALTA) 20 MG capsule Take 20 mg by mouth daily.    . empagliflozin ([name]) 10 MG TABS tablet Take 10 mg by mouth daily before breakfast. 30 tablet   . glucose blood (ACCU-CHEK AVIVA PLUS) test strip TK TO TEST BLOOD SUGAR QD    . glucose blood (ACCU-CHEK AVIVA PLUS) test strip daily    . hydrALAZINE (APRESOLINE) 100 MG tablet Take 1 tablet (100 mg  total) by mouth 3 (three) times daily. 90 tablet 6  . irbesartan (AVAPRO) 300 MG tablet Take 300 mg by mouth daily.  3  . labetalol (NORMODYNE) 200 MG tablet TAKE 1 TABLET(200 MG) BY MOUTH TWICE DAILY 180 tablet 0  . omeprazole (PRILOSEC OTC) 20 MG tablet Take 20 mg by mouth daily as needed.     . pramipexole (MIRAPEX) 0.125 MG tablet Take 0.25 mg by mouth 2 (two) times daily.    . rosuvastatin (CRESTOR) 10 MG tablet Take 10 mg by mouth daily.     . sitaGLIPtin (JANUVIA) 50 MG tablet Take 50 mg by mouth daily.    Marland Kitchen torsemide (DEMADEX) 20 MG tablet Take 2 tablets (40 mg total) by mouth daily. Take extra at 2pm if weight is 216 or greater 225 tablet 3  . traMADol (ULTRAM) 50 MG tablet Take 50 mg by mouth 3 (three) times daily as needed for moderate pain.      No current facility-administered medications for this visit.    Allergies:   Penicillins, Codeine, Adhesive [tape], Aspirin, Iodine, Ivp dye [iodinated diagnostic agents], and Sulfa antibiotics   Social History:  The patient  reports that she quit smoking about 36 years ago. She started smoking about 66 years ago. She has never used smokeless tobacco. She reports current alcohol use. She reports that she does not use drugs.   Family History:  The patient's family history includes CAD in an other family member; Sick sinus syndrome in her brother.  ROS:  Please see the history of present illness.    All other systems are reviewed and otherwise negative.   PHYSICAL EXAM:  VS:  There were no vitals taken for this visit. BMI: There is no height or weight on file to calculate BMI. Well nourished, well developed, in no acute distress  HEENT: normocephalic, atraumatic  Neck: no JVD, carotid bruits or masses Cardiac:  *** RRR; *** 1/6 SM, no rubs, or gallops Lungs:  *** CTA b/l, no wheezing, rhonchi or rales  Abd: soft, non tender MS: ***  chronic ankle deformity L>R (she states secondary to history of multiple b/l fractures), no  atrophy Ext: *** no edema  Skin: warm and dry, no rash Neuro:  No gross deficits appreciated Psych: euthymic mood, full affect  *** PPM site is stable, no tethering or discomfort   EKG:  Done today and reviewed by myself shows  ***   PPM interrogation done today and reviewed by myself :  ***   05/19/2018: R/LHC Findings:  Ao = 192/74 (118) LV = 201/26 RA = 14 RV = 53/18 PA = 53/19 (38) PCW = 27 (v = 41) Fick cardiac  output/index = 5.6/2.8 PVR = 2.0 WU Ao sat = 95% PA sat = 64%, 66%  Assessment: 1. Normal coronaries with ectactic vessels suggestive of longstanding HTN 2. Severe HTN 3. Normal LV function 4. Signficantly elevated filling pressures with pulmonary venous HTN in setting of holding diuretics for 2 days  Plan/Discussion: Filling pressures and BP elevated. Will resume diuretics. Will need aggressive titration of ant-HTN regimen.    05/14/2018: TTE Study Conclusions  - Left ventricle: Posterior basal and inferolateral hypokinesis The  cavity size was moderately dilated. Wall thickness was increased  in a pattern of mild LVH. Systolic function was normal. The  estimated ejection fraction was in the range of 50% to 55%. Left  ventricular diastolic function parameters were normal.  - Aortic valve: Sclerosis without stenosis.  - Mitral valve: Moderately calcified annulus. Moderately thickened,  mildly calcified leaflets .  - Left atrium: The atrium was mildly dilated.  - Atrial septum: No defect or patent foramen ovale was identified.     06/21/15: stress myoview Lexiscan Study Highlights     Nuclear stress EF: 64%.  There was no ST segment deviation noted during stress.  The study is normal.  This is a low risk study. No ischemia identified.      04/08/15: Echocardiogram Mod LVH LA is dilated Mild Ai and AS, mod MR/TR, mild PI EF 60-65% Sinus bradycardia  Recent Labs: 12/10/2019: B Natriuretic Peptide 47.3; BUN 57; Creatinine,  Ser 1.80; Potassium 4.6; Sodium 134  No results found for requested labs within last 8760 hours.   Estimated Creatinine Clearance: 27.9 mL/min (A) (by C-G formula based on SCr of 1.8 mg/dL (H)).   Wt Readings from Last 3 Encounters:  12/21/19 217 lb (98.4 kg)  12/10/19 222 lb 4 oz (100.8 kg)  10/27/19 220 lb 6.4 oz (100 kg)     Other studies reviewed: Additional studies/records reviewed today include: summarized above  DEVICE information: Medtronic MRI compatible ADVISA dual chamner PPM, implanted11/11/16 by Dr. Caryl Comes  ASSESSMENT AND PLAN:  1. Symptomatic bradycardia 2.  PPM     ***  3. HTN     *** .  4. Chronic CHF (Diastolic)     ***  Current medicines are reviewed at length with the patient today.  The patient did not have any concerns regarding medicines.  Haywood Lasso, PA-C 12/28/2019 8:13 PM     [address] [place] [address] [phone] (office)  [phone] (fax)

## 2019-12-30 ENCOUNTER — Encounter: Payer: Medicare PPO | Admitting: Physician Assistant

## 2019-12-30 LAB — CUP PACEART INCLINIC DEVICE CHECK
Brady Statistic RA Percent Paced: 95.2 %
Brady Statistic RV Percent Paced: 0.9 %
Date Time Interrogation Session: 20210412172126
Implantable Lead Implant Date: 20161110
Implantable Lead Implant Date: 20161110
Implantable Lead Location: 753859
Implantable Lead Location: 753860
Implantable Lead Model: 5076
Implantable Lead Model: 5076
Implantable Pulse Generator Implant Date: 20161110
Lead Channel Impedance Value: 418 Ohm
Lead Channel Impedance Value: 532 Ohm
Lead Channel Pacing Threshold Amplitude: 0.75 V
Lead Channel Pacing Threshold Amplitude: 1 V
Lead Channel Pacing Threshold Pulse Width: 0.4 ms
Lead Channel Pacing Threshold Pulse Width: 0.4 ms
Lead Channel Sensing Intrinsic Amplitude: 4.4 mV
Lead Channel Sensing Intrinsic Amplitude: 8.6 mV

## 2020-01-04 ENCOUNTER — Telehealth: Payer: Self-pay

## 2020-01-04 NOTE — Telephone Encounter (Signed)
I spoke to the patient and informed her that she may arrive at 11:15-11:30 on 4/27.  She verbalized understanding.

## 2020-01-05 ENCOUNTER — Encounter: Payer: Self-pay | Admitting: Student

## 2020-01-05 ENCOUNTER — Ambulatory Visit: Payer: Medicare PPO | Admitting: Student

## 2020-01-05 ENCOUNTER — Other Ambulatory Visit: Payer: Self-pay

## 2020-01-05 VITALS — Ht 63.0 in

## 2020-01-05 DIAGNOSIS — I495 Sick sinus syndrome: Secondary | ICD-10-CM

## 2020-01-05 LAB — CUP PACEART INCLINIC DEVICE CHECK
Battery Remaining Longevity: 77 mo
Battery Voltage: 3.01 V
Brady Statistic AP VP Percent: 0.93 %
Brady Statistic AP VS Percent: 93.93 %
Brady Statistic AS VP Percent: 0 %
Brady Statistic AS VS Percent: 5.14 %
Brady Statistic RA Percent Paced: 94.71 %
Brady Statistic RV Percent Paced: 0.95 %
Date Time Interrogation Session: 20210427113231
Implantable Lead Implant Date: 20161110
Implantable Lead Implant Date: 20161110
Implantable Lead Location: 753859
Implantable Lead Location: 753860
Implantable Lead Model: 5076
Implantable Lead Model: 5076
Implantable Pulse Generator Implant Date: 20161110
Lead Channel Impedance Value: 399 Ohm
Lead Channel Impedance Value: 437 Ohm
Lead Channel Impedance Value: 513 Ohm
Lead Channel Impedance Value: 570 Ohm
Lead Channel Pacing Threshold Amplitude: 0.5 V
Lead Channel Pacing Threshold Amplitude: 1 V
Lead Channel Pacing Threshold Pulse Width: 0.4 ms
Lead Channel Pacing Threshold Pulse Width: 0.4 ms
Lead Channel Sensing Intrinsic Amplitude: 4 mV
Lead Channel Sensing Intrinsic Amplitude: 4.375 mV
Lead Channel Sensing Intrinsic Amplitude: 9.125 mV
Lead Channel Sensing Intrinsic Amplitude: 9.5 mV
Lead Channel Setting Pacing Amplitude: 1.5 V
Lead Channel Setting Pacing Amplitude: 2 V
Lead Channel Setting Pacing Pulse Width: 0.4 ms
Lead Channel Setting Sensing Sensitivity: 2.8 mV

## 2020-01-05 NOTE — Progress Notes (Signed)
Pacemaker check in clinic. Normal device function. Thresholds, sensing, impedances consistent with previous measurements. No mode switch or high ventricular rates noted. Device programmed at appropriate safety margins. Histogram distribution appropriatefor patient activity level. Per Dr. Caryl Comes, LRL increased to 70 bpm with recent accelerated junctional rhythm. Estimated longevity 6 yr, 5 mo. Patient enrolled in remote follow-up/TTM's with [employer]. Patient education completed.

## 2020-01-06 ENCOUNTER — Telehealth (HOSPITAL_COMMUNITY): Payer: Self-pay | Admitting: Licensed Clinical Social Worker

## 2020-01-06 NOTE — Telephone Encounter (Signed)
CSW called pt to see if she had received a notice from [facility] informing her if she was approved or denied for the Extra Help program  Pt located [facility] letter which informed her that she has been approved to receive full Extra Help benefits- should mean all her medications are less than $10.  CSW updated clinic pharmacist who had requested CSW help with Extra Help regarding approval.  No further needs at this time  Jorge Ny, [facility] Desk#: [phone] Cell#: [phone]

## 2020-02-01 ENCOUNTER — Telehealth (HOSPITAL_COMMUNITY): Payer: Self-pay | Admitting: *Deleted

## 2020-02-01 NOTE — Telephone Encounter (Signed)
Pt called and asked the name of a vascular and vein specialist to use her pcp recently ran tests and she had "leaky" veins in her legs. Per Nira Conn Schub,RN [facility]. Pt aware and given the phone number so her PCP can refer her.

## 2020-02-05 ENCOUNTER — Other Ambulatory Visit (HOSPITAL_COMMUNITY): Payer: Self-pay | Admitting: *Deleted

## 2020-02-05 MED ORDER — HYDRALAZINE HCL 100 MG PO TABS: 100.0000 mg | ORAL_TABLET | Freq: Three times a day (TID) | ORAL | 6 refills | Status: DC

## 2020-02-06 ENCOUNTER — Other Ambulatory Visit: Payer: Self-pay | Admitting: Internal Medicine

## 2020-02-16 ENCOUNTER — Telehealth: Payer: Self-pay

## 2020-02-16 NOTE — Telephone Encounter (Signed)
Pt called yesterday to make a new pt appt She is having swelling and being treated for lesions on legs by wound care in [place]. Offered her an appt this week and she is unable to take it. Will have schedulers set her up with next available appt. Pt is aware this may be a month or two away.

## 2020-03-01 ENCOUNTER — Other Ambulatory Visit (HOSPITAL_COMMUNITY): Payer: Self-pay | Admitting: *Deleted

## 2020-03-01 MED ORDER — EMPAGLIFLOZIN 10 MG PO TABS: 10.0000 mg | ORAL_TABLET | Freq: Every day | ORAL | Status: DC

## 2020-03-08 ENCOUNTER — Other Ambulatory Visit (HOSPITAL_COMMUNITY): Payer: Self-pay | Admitting: *Deleted

## 2020-03-08 MED ORDER — EMPAGLIFLOZIN 10 MG PO TABS: 10.0000 mg | ORAL_TABLET | Freq: Every day | ORAL | 3 refills | Status: DC

## 2020-03-09 ENCOUNTER — Encounter: Payer: Medicare PPO | Admitting: Vascular Surgery

## 2020-03-10 ENCOUNTER — Other Ambulatory Visit: Payer: Self-pay

## 2020-03-10 ENCOUNTER — Encounter (HOSPITAL_COMMUNITY): Payer: Self-pay | Admitting: Internal Medicine

## 2020-03-10 ENCOUNTER — Ambulatory Visit (HOSPITAL_COMMUNITY)
Admission: RE | Admit: 2020-03-10 | Discharge: 2020-03-10 | Disposition: A | Discharge status: Home / Self Care | Payer: Medicare PPO | Source: Ambulatory Visit | Attending: Internal Medicine | Admitting: Internal Medicine

## 2020-03-10 VITALS — BP 105/61 | HR 73 | Wt 219.0 lb

## 2020-03-10 DIAGNOSIS — Z8249 Family history of ischemic heart disease and other diseases of the circulatory system: Secondary | ICD-10-CM | POA: Insufficient documentation

## 2020-03-10 DIAGNOSIS — Z888 Allergy status to other drugs, medicaments and biological substances status: Secondary | ICD-10-CM | POA: Insufficient documentation

## 2020-03-10 DIAGNOSIS — M1990 Unspecified osteoarthritis, unspecified site: Secondary | ICD-10-CM | POA: Diagnosis not present

## 2020-03-10 DIAGNOSIS — I890 Lymphedema, not elsewhere classified: Secondary | ICD-10-CM | POA: Insufficient documentation

## 2020-03-10 DIAGNOSIS — Z713 Dietary counseling and surveillance: Secondary | ICD-10-CM | POA: Insufficient documentation

## 2020-03-10 DIAGNOSIS — Z880 Allergy status to penicillin: Secondary | ICD-10-CM | POA: Insufficient documentation

## 2020-03-10 DIAGNOSIS — I130 Hypertensive heart and chronic kidney disease with heart failure and stage 1 through stage 4 chronic kidney disease, or unspecified chronic kidney disease: Secondary | ICD-10-CM | POA: Insufficient documentation

## 2020-03-10 DIAGNOSIS — Z886 Allergy status to analgesic agent status: Secondary | ICD-10-CM | POA: Insufficient documentation

## 2020-03-10 DIAGNOSIS — Z885 Allergy status to narcotic agent status: Secondary | ICD-10-CM | POA: Insufficient documentation

## 2020-03-10 DIAGNOSIS — G2581 Restless legs syndrome: Secondary | ICD-10-CM | POA: Insufficient documentation

## 2020-03-10 DIAGNOSIS — Z794 Long term (current) use of insulin: Secondary | ICD-10-CM | POA: Diagnosis not present

## 2020-03-10 DIAGNOSIS — E1140 Type 2 diabetes mellitus with diabetic neuropathy, unspecified: Secondary | ICD-10-CM | POA: Insufficient documentation

## 2020-03-10 DIAGNOSIS — Z6838 Body mass index (BMI) 38.0-38.9, adult: Secondary | ICD-10-CM | POA: Insufficient documentation

## 2020-03-10 DIAGNOSIS — Z905 Acquired absence of kidney: Secondary | ICD-10-CM | POA: Insufficient documentation

## 2020-03-10 DIAGNOSIS — I5032 Chronic diastolic (congestive) heart failure: Secondary | ICD-10-CM | POA: Diagnosis not present

## 2020-03-10 DIAGNOSIS — N1832 Chronic kidney disease, stage 3b: Secondary | ICD-10-CM | POA: Insufficient documentation

## 2020-03-10 DIAGNOSIS — G4733 Obstructive sleep apnea (adult) (pediatric): Secondary | ICD-10-CM | POA: Insufficient documentation

## 2020-03-10 DIAGNOSIS — E1122 Type 2 diabetes mellitus with diabetic chronic kidney disease: Secondary | ICD-10-CM | POA: Insufficient documentation

## 2020-03-10 DIAGNOSIS — Z882 Allergy status to sulfonamides status: Secondary | ICD-10-CM | POA: Diagnosis not present

## 2020-03-10 DIAGNOSIS — Z87891 Personal history of nicotine dependence: Secondary | ICD-10-CM | POA: Insufficient documentation

## 2020-03-10 DIAGNOSIS — R0600 Dyspnea, unspecified: Secondary | ICD-10-CM | POA: Diagnosis not present

## 2020-03-10 DIAGNOSIS — G894 Chronic pain syndrome: Secondary | ICD-10-CM | POA: Insufficient documentation

## 2020-03-10 DIAGNOSIS — I10 Essential (primary) hypertension: Secondary | ICD-10-CM

## 2020-03-10 DIAGNOSIS — Z950 Presence of cardiac pacemaker: Secondary | ICD-10-CM | POA: Insufficient documentation

## 2020-03-10 DIAGNOSIS — E785 Hyperlipidemia, unspecified: Secondary | ICD-10-CM | POA: Diagnosis not present

## 2020-03-10 DIAGNOSIS — I495 Sick sinus syndrome: Secondary | ICD-10-CM | POA: Insufficient documentation

## 2020-03-10 DIAGNOSIS — M797 Fibromyalgia: Secondary | ICD-10-CM | POA: Diagnosis not present

## 2020-03-10 DIAGNOSIS — S81802A Unspecified open wound, left lower leg, initial encounter: Secondary | ICD-10-CM

## 2020-03-10 DIAGNOSIS — Z79899 Other long term (current) drug therapy: Secondary | ICD-10-CM | POA: Insufficient documentation

## 2020-03-10 DIAGNOSIS — K219 Gastro-esophageal reflux disease without esophagitis: Secondary | ICD-10-CM | POA: Diagnosis not present

## 2020-03-10 DIAGNOSIS — Z9119 Patient's noncompliance with other medical treatment and regimen: Secondary | ICD-10-CM | POA: Insufficient documentation

## 2020-03-10 NOTE — Patient Instructions (Addendum)
You have been referred to the [facility], they will contact you for an appointment  Please call our office in December to schedule your follow up appointment.  If you have any questions or concerns before your next appointment please send [place] a message through [place] or call our office at [phone].    TO LEAVE A MESSAGE FOR THE NURSE SELECT OPTION 2, PLEASE LEAVE A MESSAGE INCLUDING: . YOUR NAME . DATE OF BIRTH . CALL BACK NUMBER . REASON FOR CALL**this is important as we prioritize the call backs  [place] AS LONG AS YOU CALL BEFORE 4:00 PM  At the [facility], you and your health needs are our priority. As part of our continuing mission to provide you with exceptional heart care, we have created designated Provider Care Teams. These Care Teams include your primary Cardiologist (physician) and Advanced Practice Providers (APPs- Physician Assistants and Nurse Practitioners) who all work together to provide you with the care you need, when you need it.   You may see any of the following providers on your designated Care Team at your next follow up: Marland Kitchen Dr Glori Bickers . Dr Loralie Champagne . Darrick Grinder, NP . Lyda Jester, PA . Audry Riles, PharmD   Please be sure to bring in all your medications bottles to every appointment.

## 2020-03-10 NOTE — Progress Notes (Signed)
Advanced [facility] Failure Clinic Note   Date:  03/10/2020   ID:  [name], DOB 1940/10/30, MRN [id number]  Location: Home  Provider location: [facility] Type of Visit: Established patient  PCP:  Pomposini, [name], MD  Cardiologist:  Fransico Him, MD Primary HF: Guilianna Mckoy  Chief Complaint: Heart Failure follow-up   History of Present Illness:  [name] is a 79 y.o. female with history of diastolic heart failure, DM, GERD, colon cancer, CKD stage III, 2016 pacemaker (MDT) due to symptomatic sinus node dysfunction.  In July 2019, she had 12 pound weight and she was sent to the ED by her PCP. She was sent home on metolazone every other day for 14 days. She took 4 doses and her creatinine went up to 3 so metolazone was stopped.   R/L cath in 9/19. Normal cors. Elevated filling pressures with pulmonary venous HTN.   She is here with her daughter for routine follow-up. Feels OK. She is in the process of moving to [place]. Following with wound clinic for LE lymphedema and sore on LLE. Not moving around much. SOB with mild exertion. No change. Sleeps in recliner. Was prescribed CPAP but she won't wear it.   Sleep study 10/19: AHI 49  Studies: PFTs 05/23/18: FEV1: 1.72 FEV1/FVC ratio: 77% DLCO: 70%  R/LHC 05/19/18: Ao = 192/74 (118) LV = 201/26 RA = 14 RV = 53/18 PA = 53/19 (38) PCW = 27 (v = 41) Fick cardiac output/index = 5.6/2.8 PVR = 2.0 WU Ao sat = 95% PA sat = 64%, 66% Assessment: 1. Normal coronaries with ectactic vessels suggestive of longstanding HTN 2. Severe HTN 3. Normal LV function 4. Signficantly elevated filling pressures with pulmonary venous HTN in setting of holding diuretics for 2 days Plan/Discussion: Filling pressures and BP elevated. Will resume diuretics. Will need aggressive titration of ant-HTN regimen.  Echo 05/14/18: - Left ventricle: Posterior basal and inferolateral hypokinesis The   cavity size was moderately  dilated. Wall thickness was increased   in a pattern of mild LVH. Systolic function was normal. The   estimated ejection fraction was in the range of 50% to 55%. Left   ventricular diastolic function parameters were normal. - Aortic valve: Sclerosis without stenosis. - Mitral valve: Moderately calcified annulus. Moderately thickened,   mildly calcified leaflets . - Left atrium: The atrium was mildly dilated. - Atrial septum: No defect or patent foramen ovale was identified.   06/21/2015 Myoview   Nuclear stress EF: 64%.  There was no ST segment deviation noted during stress.  The study is normal.  This is a low risk study. No ischemia identified.  SH: Former 30-40 pack year smoker, quit 25 years ago.    [name] denies symptoms worrisome for COVID 19.   Past Medical History:  Diagnosis Date  . Anxiety   . Arthritis   . Cancer ([place])    colon  . Chronic kidney disease    stage 3  . Chronic pain syndrome   . Diabetes ([place])   . Diabetic neuropathy ([place])   . Dyslipidemia   . Early cataracts, bilateral   . Fibromyalgia   . GERD (gastroesophageal reflux disease)   . H/O syncope   . Heart murmur   . Hypertension   . Lumbar spinal stenosis    and scoliosis  . Pneumonia   . PONV (postoperative nausea and vomiting)   . Presence of permanent cardiac pacemaker   . Restless  legs   . Spinal headache   . Thyroid nodule    Past Surgical History:  Procedure Laterality Date  . APPENDECTOMY    . BREAST SURGERY     lumpectomy  . COLON SURGERY    . DILATION AND CURETTAGE OF UTERUS    . EP IMPLANTABLE DEVICE N/A 07/21/2015   Procedure: Pacemaker Implant;  Surgeon: Deboraha Sprang, MD;  Location: [facility];  Service: Cardiovascular;  Laterality: N/A;  . LUMBAR LAMINECTOMY/DECOMPRESSION MICRODISCECTOMY N/A 02/02/2016   Procedure: DECOMPRESSION L4-L5 WITH INSITE 2 FUSION ;  Surgeon: Melina Schools, MD;  Location: [facility];  Service: Orthopedics;  Laterality: N/A;  .  PARTIAL NEPHRECTOMY    . RIGHT/LEFT HEART CATH AND CORONARY ANGIOGRAPHY N/A 05/19/2018   Procedure: RIGHT/LEFT HEART CATH AND CORONARY ANGIOGRAPHY;  Surgeon: Jolaine Artist, MD;  Location: [facility];  Service: Cardiovascular;  Laterality: N/A;  . TONSILLECTOMY    . TUBAL LIGATION       Current Outpatient Medications  Medication Sig Dispense Refill  . ACCU-CHEK AVIVA PLUS test strip     . Accu-Chek Softclix Lancets lancets     . acetaminophen (TYLENOL) 650 MG CR tablet Take 1,300 mg by mouth every 8 (eight) hours.     . B-D UF III MINI PEN NEEDLES 31G X 5 MM MISC     . Blood Glucose Monitoring Suppl (ACCU-CHEK AVIVA PLUS) w/Device KIT     . Cholecalciferol (VITAMIN D) 2000 UNITS tablet Take 2,000 Units by mouth daily.    . cyclobenzaprine (FLEXERIL) 10 MG tablet Take 10 mg by mouth 3 (three) times daily as needed for muscle spasms.    Marland Kitchen doxylamine, Sleep, (UNISOM) 25 MG tablet Take 25 mg by mouth at bedtime.    . DULoxetine (CYMBALTA) 20 MG capsule Take 20 mg by mouth daily.    . empagliflozin (JARDIANCE) 10 MG TABS tablet Take 1 tablet (10 mg total) by mouth daily before breakfast. 90 tablet 3  . glucose blood (ACCU-CHEK AVIVA PLUS) test strip TK TO TEST BLOOD SUGAR QD    . glucose blood (ACCU-CHEK AVIVA PLUS) test strip daily    . hydrALAZINE (APRESOLINE) 100 MG tablet Take 1 tablet (100 mg total) by mouth 3 (three) times daily. 90 tablet 6  . insulin lispro (HUMALOG KWIKPEN) 100 UNIT/ML KwikPen Humalog KwikPen (U-100) Insulin 100 unit/mL subcutaneous  Inject 10 units eveyday before largest meal    . irbesartan (AVAPRO) 300 MG tablet Take 300 mg by mouth daily.  3  . labetalol (NORMODYNE) 200 MG tablet TAKE 1 TABLET(200 MG) BY MOUTH TWICE DAILY 180 tablet 3  . omeprazole (PRILOSEC OTC) 20 MG tablet Take 20 mg by mouth daily as needed.     . pramipexole (MIRAPEX) 0.125 MG tablet Take 0.25 mg by mouth 2 (two) times daily.    . rosuvastatin (CRESTOR) 10 MG tablet Take 10 mg by  mouth daily.     Marland Kitchen torsemide (DEMADEX) 20 MG tablet Take 2 tablets (40 mg total) by mouth daily. Take extra at 2pm if weight is 216 or greater 225 tablet 3  . traMADol (ULTRAM) 50 MG tablet Take 50 mg by mouth 3 (three) times daily as needed for moderate pain.      No current facility-administered medications for this encounter.    Allergies:   Penicillins, Codeine, Adhesive [tape], Aspirin, Iodine, Ivp dye [iodinated diagnostic agents], and Sulfa antibiotics   Social History:  The patient  reports that she quit smoking  about 36 years ago. She started smoking about 66 years ago. She has never used smokeless tobacco. She reports current alcohol use. She reports that she does not use drugs.   Family History:  The patient's family history includes CAD in an other family member; Sick sinus syndrome in her brother.   ROS:  Please see the history of present illness.   All other systems are personally reviewed and negative.   Vitals:   03/10/20 1142  BP: 105/61  Pulse: 73  SpO2: 97%  Weight: 99.3 kg (219 lb)    Exam:   General:  Well appearing. No resp difficulty HEENT: normal Neck: supple. no JVD. Carotids 2+ bilat; no bruits. No lymphadenopathy or thryomegaly appreciated. Cor: PMI nondisplaced. Regular rate & rhythm. No rubs, gallops or murmurs. Lungs: clear Abdomen: obese soft, nontender, nondistended. No hepatosplenomegaly. No bruits or masses. Good bowel sounds. Extremities: no cyanosis, clubbing, rash, 2+ ankle edema with sore on LLE Neuro: alert & orientedx3, cranial nerves grossly intact. moves all 4 extremities w/o difficulty. Affect pleasant  Recent Labs: 12/10/2019: B Natriuretic Peptide 47.3; BUN 57; Creatinine, Ser 1.80; Potassium 4.6; Sodium 134  Personally reviewed   Wt Readings from Last 3 Encounters:  03/10/20 99.3 kg (219 lb)  12/21/19 98.4 kg (217 lb)  12/10/19 100.8 kg (222 lb 4 oz)      ASSESSMENT AND PLAN:  1. Chronic Diastolic Heart Failure  - Echo 05/14/18:  EF 50-55%, posterior basal and inferolateral HK, mild LVH, LA mildly dilated - R/LHC 05/19/18: normal coronaries, elevated filling pressure (had held diuretics x2 days), severe HTN. - Stable NYHA III- IIIB dyspnea.  - Volume status looks stable.  LE edema appears to be mostly venous insufficiency - Continue torsemide 40 daily. - Absolutely needs to wear compression hose or leg wraps for LE lymphedema. Will refer to wound center for LLE wound.  - Hold off on spiro with previous AKI - Continue Jardiance  2. HTN, severe, - Blood pressure well controlled. Continue current regimen.  3. OSA  - severe, AHI 49 by PSG in 10/19 - f/u testing AHI 11 - Had televisit with Dr. Radford Pax on 09/30/19. She refused CPAP due to claustrophobia.  - Continues to refuse   4. Dyspnea - LHC 05/19/18 with normal coronaries - PFTs with DLCO 70% - Likely mostly due to obesity + OSA - Stressed need again for weight loss with low-carb diet   5. CKD Stage IIIb-IV - Creatinine 1.6 in 2/21.  - Recheck today - Has appt with [facility]  6. Sinus node dysfunction s/p MDT PPM - Follows with Dr Caryl Comes - Good HR variation with walking around clinic last visit 64 resting to 95 bpm walking  7. DM - Continue Jardiance   8. Severe obesity - stressed need for more activity and [place] Body mass index is 38.79 kg/m.   Glori Bickers, MD  03/10/2020 12:06 PM  Advanced [facility] [address] [facility] [place] [address] [phone] (office) [phone] (fax)

## 2020-03-28 ENCOUNTER — Encounter (HOSPITAL_BASED_OUTPATIENT_CLINIC_OR_DEPARTMENT_OTHER): Payer: Medicare PPO | Attending: Internal Medicine | Admitting: Internal Medicine

## 2020-03-28 DIAGNOSIS — E1140 Type 2 diabetes mellitus with diabetic neuropathy, unspecified: Secondary | ICD-10-CM | POA: Insufficient documentation

## 2020-03-28 DIAGNOSIS — L97221 Non-pressure chronic ulcer of left calf limited to breakdown of skin: Secondary | ICD-10-CM | POA: Insufficient documentation

## 2020-03-28 DIAGNOSIS — E1122 Type 2 diabetes mellitus with diabetic chronic kidney disease: Secondary | ICD-10-CM | POA: Insufficient documentation

## 2020-03-28 DIAGNOSIS — Z950 Presence of cardiac pacemaker: Secondary | ICD-10-CM | POA: Diagnosis not present

## 2020-03-28 DIAGNOSIS — N183 Chronic kidney disease, stage 3 unspecified: Secondary | ICD-10-CM | POA: Insufficient documentation

## 2020-03-28 DIAGNOSIS — G2581 Restless legs syndrome: Secondary | ICD-10-CM | POA: Diagnosis not present

## 2020-03-28 DIAGNOSIS — I272 Pulmonary hypertension, unspecified: Secondary | ICD-10-CM | POA: Diagnosis not present

## 2020-03-28 DIAGNOSIS — Z794 Long term (current) use of insulin: Secondary | ICD-10-CM | POA: Diagnosis not present

## 2020-03-28 DIAGNOSIS — E11622 Type 2 diabetes mellitus with other skin ulcer: Secondary | ICD-10-CM | POA: Diagnosis present

## 2020-03-28 DIAGNOSIS — L97218 Non-pressure chronic ulcer of right calf with other specified severity: Secondary | ICD-10-CM | POA: Diagnosis not present

## 2020-03-28 DIAGNOSIS — I890 Lymphedema, not elsewhere classified: Secondary | ICD-10-CM | POA: Diagnosis not present

## 2020-03-28 DIAGNOSIS — I130 Hypertensive heart and chronic kidney disease with heart failure and stage 1 through stage 4 chronic kidney disease, or unspecified chronic kidney disease: Secondary | ICD-10-CM | POA: Diagnosis not present

## 2020-03-28 DIAGNOSIS — I87333 Chronic venous hypertension (idiopathic) with ulcer and inflammation of bilateral lower extremity: Secondary | ICD-10-CM | POA: Insufficient documentation

## 2020-03-28 DIAGNOSIS — G4733 Obstructive sleep apnea (adult) (pediatric): Secondary | ICD-10-CM | POA: Insufficient documentation

## 2020-03-28 DIAGNOSIS — I5032 Chronic diastolic (congestive) heart failure: Secondary | ICD-10-CM | POA: Insufficient documentation

## 2020-04-01 NOTE — Progress Notes (Signed)
[name] ([id number]) Visit Report for 03/28/2020 Abuse/Suicide Risk Screen Details Patient Name: Date of Service: [name], [name] 03/28/2020 10:30 A M Medical Record Number: [id number] Patient Account Number: [id number] Date of Birth/Sex: Treating RN: [birth year] (79 y.o. [name] Primary Care Abhishek Levesque: Margaretha Sheffield Other Clinician: Referring Aland Chestnutt: Treating Lilla Callejo/Extender: Benedetto Coons in Treatment: 0 Abuse/Suicide Risk Screen Items Answer ABUSE RISK SCREEN: Has anyone close to you tried to hurt or harm you recentlyo No Do you feel uncomfortable with anyone in your familyo No Has anyone forced you do things that you didnt want to doo No Electronic Signature(s) Signed: 04/01/2020 5:42:58 PM By: Carlene Coria RN Entered By: Carlene Coria on 03/28/2020 11:39:47 -------------------------------------------------------------------------------- Activities of Daily Living Details Patient Name: Date of Service: [name] 03/28/2020 10:30 A M Medical Record Number: [id number] Patient Account Number: [id number] Date of Birth/Sex: Treating RN: 11-01-1940 (79 y.o. [name] Primary Care Teofil Maniaci: Margaretha Sheffield Other Clinician: Referring Sadler Teschner: Treating Kiasha Bellin/Extender: Benedetto Coons in Treatment: 0 Activities of Daily Living Items Answer Activities of Daily Living (Please select one for each item) Drive Automobile Completely Able T Medications ake Completely Able Use T elephone Completely Able Care for Appearance Completely Able Use T oilet Completely Able Bath / Shower Completely Able Dress Self Completely Able Feed Self Completely Able Walk Completely Able Get In / Out Bed Completely Able Housework Completely Able Prepare Meals Completely [place] for Self Completely Able Electronic Signature(s) Signed: 04/01/2020 5:42:58 PM By: Carlene Coria RN Entered By:  Carlene Coria on 03/28/2020 11:40:20 -------------------------------------------------------------------------------- Education Screening Details Patient Name: Date of Service: [name] 03/28/2020 10:30 A M Medical Record Number: [id number] Patient Account Number: [id number] Date of Birth/Sex: Treating RN: 19-Jul-1941 (79 y.o. [name] Primary Care Yazan Gatling: Margaretha Sheffield Other Clinician: Referring Saraann Enneking: Treating Odean Mcelwain/Extender: Benedetto Coons in Treatment: 0 Primary Learner Assessed: Patient Learning Preferences/Education Level/Primary Language Learning Preference: Explanation Highest Education Level: High School Preferred Language: English Cognitive Barrier Language Barrier: No Translator Needed: No Memory Deficit: No Emotional Barrier: No Cultural/Religious Beliefs Affecting Medical Care: No Physical Barrier Impaired Vision: Yes Glasses Impaired Hearing: No Decreased Hand dexterity: No Knowledge/Comprehension Knowledge Level: Medium Comprehension Level: High Ability to understand written instructions: High Ability to understand verbal instructions: High Motivation Anxiety Level: Anxious Cooperation: Cooperative Education Importance: Acknowledges Need Interest in Health Problems: Asks Questions Perception: Coherent Willingness to Engage in Self-Management High Activities: Readiness to Engage in Self-Management High Activities: Electronic Signature(s) Signed: 04/01/2020 5:42:58 PM By: Carlene Coria RN Entered By: Carlene Coria on 03/28/2020 11:41:42 -------------------------------------------------------------------------------- Fall Risk Assessment Details Patient Name: Date of Service: [name] 03/28/2020 10:30 A M Medical Record Number: [id number] Patient Account Number: [id number] Date of Birth/Sex: Treating RN: 14-Aug-1941 (79 y.o. [name] Primary Care Cato Liburd: Margaretha Sheffield Other  Clinician: Referring Vannak Montenegro: Treating Cristofher Livecchi/Extender: Benedetto Coons in Treatment: 0 Fall Risk Assessment Items Have you had 2 or more falls in the last 12 monthso 0 No Have you had any fall that resulted in injury in the last 12 monthso 0 No FALLS RISK SCREEN History of falling - immediate or within 3 months 0 No Secondary diagnosis (Do you have 2 or more medical diagnoseso) 0 No Ambulatory aid None/bed rest/wheelchair/nurse 0 No Crutches/cane/walker 0 No Furniture 0 No Intravenous therapy Access/Saline/Heparin Lock 0 No Gait/Transferring Normal/ bed rest/ wheelchair 0 No Weak (short steps with or without shuffle, stooped but able  to lift head while walking, may seek 0 No support from furniture) Impaired (short steps with shuffle, may have difficulty arising from chair, head down, impaired 0 No balance) Mental Status Oriented to own ability 0 No Electronic Signature(s) Signed: 04/01/2020 5:42:58 PM By: Carlene Coria RN Entered By: Carlene Coria on 03/28/2020 11:42:06 -------------------------------------------------------------------------------- Foot Assessment Details Patient Name: Date of Service: [name] 03/28/2020 10:30 A M Medical Record Number: [id number] Patient Account Number: [id number] Date of Birth/Sex: Treating RN: 07/13/1941 (79 y.o. [name] Primary Care Maebelle Sulton: Margaretha Sheffield Other Clinician: Referring Waino Mounsey: Treating Avinash Maltos/Extender: Benedetto Coons in Treatment: 0 Foot Assessment Items Site Locations + = Sensation present, - = Sensation absent, C = Callus, U = Ulcer R = Redness, W = Warmth, M = Maceration, PU = Pre-ulcerative lesion F = Fissure, S = Swelling, D = Dryness Assessment Right: Left: Other Deformity: No No Prior Foot Ulcer: No No Prior Amputation: No No Charcot Joint: No No Ambulatory Status: Ambulatory With Help Assistance Device: Walker Gait:  Steady Electronic Signature(s) Signed: 04/01/2020 5:42:58 PM By: Carlene Coria RN Entered By: Carlene Coria on 03/28/2020 11:45:39 -------------------------------------------------------------------------------- Nutrition Risk Screening Details Patient Name: Date of Service: [name] 03/28/2020 10:30 A M Medical Record Number: [id number] Patient Account Number: [id number] Date of Birth/Sex: Treating RN: 08-Jan-1941 (79 y.o. [name] Primary Care Nikki Glanzer: Margaretha Sheffield Other Clinician: Referring Jadarion Halbig: Treating Lovelyn Sheeran/Extender: Benedetto Coons in Treatment: 0 Height (in): 63 Weight (lbs): 220 Body Mass Index (BMI): 39 Nutrition Risk Screening Items Score Screening NUTRITION RISK SCREEN: I have an illness or condition that made me change the kind and/or amount of food I eat 0 No I eat fewer than two meals per day 0 No I eat few fruits and vegetables, or milk products 0 No I have three or more drinks of beer, liquor or wine almost every day 0 No I have tooth or mouth problems that make it hard for me to eat 0 No I don't always have enough money to buy the food I need 0 No I eat alone most of the time 0 No I take three or more different prescribed or over-the-counter drugs a day 1 Yes Without wanting to, I have lost or gained 10 pounds in the last six months 0 No I am not always physically able to shop, cook and/or feed myself 0 No Nutrition Protocols Good Risk Protocol 0 No interventions needed Moderate Risk Protocol High Risk Proctocol Risk Level: Good Risk Score: 1 Electronic Signature(s) Signed: 04/01/2020 5:42:58 PM By: Carlene Coria RN Entered By: Carlene Coria on 03/28/2020 11:42:43

## 2020-04-01 NOTE — Progress Notes (Signed)
[name], [name] ([id number]) Visit Report for 03/28/2020 Chief Complaint Document Details Patient Name: Date of Service: [name], [name] 03/28/2020 10:30 A M Medical Record Number: [id number] Patient Account Number: [id number] Date of Birth/Sex: Treating RN: 1941/04/17 (79 y.o. [name] Primary Care Provider: Margaretha Sheffield Other Clinician: Referring Provider: Treating Provider/Extender: Benedetto Coons in Treatment: 0 Information Obtained from: Patient Chief Complaint 03/28/2020; patient is here for review of wounds on her bilateral lower legs Electronic Signature(s) Signed: 03/29/2020 6:00:01 PM By: Linton Ham MD Entered By: Linton Ham on 03/28/2020 12:44:14 -------------------------------------------------------------------------------- HPI Details Patient Name: Date of Service: [name], [name] 03/28/2020 10:30 A M Medical Record Number: [id number] Patient Account Number: [id number] Date of Birth/Sex: Treating RN: 1941-05-04 (79 y.o. [name] Primary Care Provider: Margaretha Sheffield Other Clinician: Referring Provider: Treating Provider/Extender: Benedetto Coons in Treatment: 0 History of Present Illness HPI Description: ADMISSION 03/28/2020 This is a 79 year old woman who is accompanied by her daughter. She is moving to [place] from [place] where she lives. Infectious been going to the wound care center in [place] for the last 2 months. She apparently did not tolerate compression early on in that clinic stay. She is just using dry gauze over the wounds when she came in today. She tells me she has had both arterial and venous reflux studies although she does not have copies of these. She was apparently offered an ablation but I have no further information on that. She also is a type II diabetic. She was noted to have wounds on her lower extremities during a CHF clinic visit with Dr. Haroldine Laws and  she was referred here. She has several scattered areas on the right medial lower leg and a large area of superficial ulceration on the posterior medial left lower leg. past medical history; chronic kidney disease stage III, gastroesophageal reflux disease, type 2 diabetes with neuropathy, chronic diastolic heart failure, obstructive sleep apnea, hypertension, restless leg syndrome, pulmonary venous hypertension, history of colon CA, pacemaker and apparently venous reflux. We did not do arterial studies in the clinic today we are going to try to get the results that were already done within the last 2 months in [place] Signature(s) Signed: 03/29/2020 6:00:01 PM By: Linton Ham MD Entered By: Linton Ham on 03/28/2020 12:48:36 -------------------------------------------------------------------------------- Physical Exam Details Patient Name: Date of Service: [name], [name] 03/28/2020 10:30 A M Medical Record Number: [id number] Patient Account Number: [id number] Date of Birth/Sex: Treating RN: 1941/08/30 (79 y.o. [name] Primary Care Provider: Margaretha Sheffield Other Clinician: Referring Provider: Treating Provider/Extender: Benedetto Coons in Treatment: 0 Constitutional Sitting or standing Blood Pressure is within target range for patient.. Pulse regular and within target range for patient.Marland Kitchen Respirations regular, non-labored and within target range.. Temperature is normal and within the target range for the patient.Marland Kitchen Appears in no distress. Respiratory work of breathing is normal. Bilateral breath sounds are clear and equal in all lobes with no wheezes, rales or rhonchi.. Cardiovascular 3 out of 6 systolic ejection murmur. JVP marginal at 45 degrees. No sacral edema. Pedal pulses easily palpable bilaterally. Nonpitting edema of the left greater than right leg. Musculoskeletal Valgus deformity at the left ankle which I am assuming is  Charcot although she says that this was evaluated at [facility] some years ago and it was not. Integumentary (Hair, Skin) Changes of significant chronic venous insufficiency with stasis dermatitis in the left lower leg greater than the right. Neurological Absent vibration and  microfilament test. Psychiatric appears at normal baseline. Notes Wound exam The left is actually quite a bit worse with innumerable wounds on the medial and posterior part of the calf. These extend laterally. Very significant venous inflammation On the right medial lower leg a scattering of small wounds Electronic Signature(s) Signed: 03/29/2020 6:00:01 PM By: Linton Ham MD Entered By: Linton Ham on 03/28/2020 13:00:33 -------------------------------------------------------------------------------- Physician Orders Details Patient Name: Date of Service: [name], [name] 03/28/2020 10:30 A M Medical Record Number: [id number] Patient Account Number: [id number] Date of Birth/Sex: Treating RN: 06-01-1941 (79 y.o. [name] Primary Care Provider: Margaretha Sheffield Other Clinician: Referring Provider: Treating Provider/Extender: Benedetto Coons in Treatment: 0 Verbal / Phone Orders: No Diagnosis Coding Follow-up Appointments Return Appointment in 1 week. Dressing Change Frequency Wound #1 Left,Circumferential Lower Leg Do not change entire dressing for one week. Wound #2 Right,Medial Lower Leg Do not change entire dressing for one week. Skin Barriers/Peri-Wound Care Moisturizing lotion - both legs TCA Cream or Ointment - mixed with lotion Wound Cleansing May shower with protection. - use cast protector Primary Wound Dressing Wound #1 Left,Circumferential Lower Leg Calcium Alginate with Silver Wound #2 Right,Medial Lower Leg Calcium Alginate with Silver Secondary Dressing Wound #1 Left,Circumferential Lower Leg ABD pad Zetuvit or Kerramax Wound #2 Right,Medial Lower  Leg ABD pad Edema Control 3 Layer Compression System - Bilateral Avoid standing for long periods of time Elevate legs to the level of the heart or above for 30 minutes daily and/or when sitting, a frequency of: - throughout the day Exercise regularly Electronic Signature(s) Signed: 03/28/2020 4:12:59 PM By: Levan Hurst RN, BSN Signed: 03/29/2020 6:00:01 PM By: Linton Ham MD Entered By: Levan Hurst on 03/28/2020 12:24:19 -------------------------------------------------------------------------------- Problem List Details Patient Name: Date of Service: [name], [name] 03/28/2020 10:30 A M Medical Record Number: [id number] Patient Account Number: [id number] Date of Birth/Sex: Treating RN: June 08, 1941 (79 y.o. [name] Primary Care Provider: Margaretha Sheffield Other Clinician: Referring Provider: Treating Provider/Extender: Benedetto Coons in Treatment: 0 Active Problems ICD-10 Encounter Code Description Active Date MDM Diagnosis I87.333 Chronic venous hypertension (idiopathic) with ulcer and inflammation of 03/28/2020 No Yes bilateral lower extremity E11.622 Type 2 diabetes mellitus with other skin ulcer 03/28/2020 No Yes L97.221 Non-pressure chronic ulcer of left calf limited to breakdown of skin 03/28/2020 No Yes L97.218 Non-pressure chronic ulcer of right calf with other specified severity 03/28/2020 No Yes I89.0 Lymphedema, not elsewhere classified 03/28/2020 No Yes Inactive Problems Resolved Problems Electronic Signature(s) Signed: 03/29/2020 6:00:01 PM By: Linton Ham MD Entered By: Linton Ham on 03/28/2020 12:46:55 -------------------------------------------------------------------------------- Progress Note Details Patient Name: Date of Service: [name] 03/28/2020 10:30 A M Medical Record Number: [id number] Patient Account Number: [id number] Date of Birth/Sex: Treating RN: 1941-03-10 (79 y.o. [name] Primary  Care Provider: Margaretha Sheffield Other Clinician: Referring Provider: Treating Provider/Extender: Benedetto Coons in Treatment: 0 Subjective Chief Complaint Information obtained from Patient 03/28/2020; patient is here for review of wounds on her bilateral lower legs History of Present Illness (HPI) ADMISSION 03/28/2020 This is a 79 year old woman who is accompanied by her daughter. She is moving to [place] from [place] where she lives. Infectious been going to the wound care center in [place] for the last 2 months. She apparently did not tolerate compression early on in that clinic stay. She is just using dry gauze over the wounds when she came in today. She tells me she has had both arterial and  venous reflux studies although she does not have copies of these. She was apparently offered an ablation but I have no further information on that. She also is a type II diabetic. She was noted to have wounds on her lower extremities during a CHF clinic visit with Dr. Haroldine Laws and she was referred here. She has several scattered areas on the right medial lower leg and a large area of superficial ulceration on the posterior medial left lower leg. past medical history; chronic kidney disease stage III, gastroesophageal reflux disease, type 2 diabetes with neuropathy, chronic diastolic heart failure, obstructive sleep apnea, hypertension, restless leg syndrome, pulmonary venous hypertension, history of colon CA, pacemaker and apparently venous reflux. We did not do arterial studies in the clinic today we are going to try to get the results that were already done within the last 2 months in [place] Patient History Information obtained from Patient. Allergies penicillin, codeine, adhesive, aspirin, iodine, Iodinated Contrast Media, Sulfa (Sulfonamide Antibiotics) Family History Diabetes - Siblings, Heart Disease - Mother, Hypertension - Mother, Kidney Disease -  Siblings, No family history of Cancer, Hereditary Spherocytosis, Lung Disease, Seizures, Stroke, Thyroid Problems, Tuberculosis. Social History Never smoker, Marital Status - Widowed, Alcohol Use - Rarely, Drug Use - No History, Caffeine Use - Daily. Medical History Eyes Denies history of Cataracts, Glaucoma, Optic Neuritis Ear/Nose/Mouth/Throat Denies history of Chronic sinus problems/congestion, Middle ear problems Hematologic/Lymphatic Denies history of Anemia, Hemophilia, Human Immunodeficiency Virus, Lymphedema, Sickle Cell Disease Respiratory Denies history of Aspiration, Asthma, Chronic Obstructive Pulmonary Disease (COPD), Pneumothorax, Sleep Apnea, Tuberculosis Cardiovascular Patient has history of Congestive Heart Failure, Hypertension, Peripheral Venous Disease Denies history of Angina, Arrhythmia, Coronary Artery Disease, Deep Vein Thrombosis, Hypotension, Myocardial Infarction, Peripheral Arterial Disease, Phlebitis, Vasculitis Gastrointestinal Denies history of Cirrhosis , Colitis, Crohnoos, Hepatitis A, Hepatitis B, Hepatitis C Endocrine Patient has history of Type II Diabetes Denies history of Type I Diabetes Genitourinary Denies history of End Stage Renal Disease Immunological Denies history of Lupus Erythematosus, Raynaudoos, Scleroderma Integumentary (Skin) Denies history of History of Burn Neurologic Denies history of Dementia, Neuropathy, Quadriplegia, Paraplegia, Seizure Disorder Oncologic Denies history of Received Chemotherapy, Received Radiation Psychiatric Denies history of Anorexia/bulimia, Confinement Anxiety Patient is treated with Insulin. Blood sugar is tested. Review of Systems (ROS) Constitutional Symptoms (General Health) Denies complaints or symptoms of Fatigue, Fever, Chills, Marked Weight Change. Eyes Complains or has symptoms of Glasses / Contacts. Denies complaints or symptoms of Dry Eyes, Vision Changes. Ear/Nose/Mouth/Throat Denies  complaints or symptoms of Chronic sinus problems or rhinitis. Respiratory Denies complaints or symptoms of Chronic or frequent coughs, Shortness of Breath. Cardiovascular Denies complaints or symptoms of Chest pain. Gastrointestinal Denies complaints or symptoms of Frequent diarrhea, Nausea, Vomiting. Endocrine Denies complaints or symptoms of Heat/cold intolerance. Genitourinary Denies complaints or symptoms of Frequent urination. Integumentary (Skin) Complains or has symptoms of Wounds. Musculoskeletal Denies complaints or symptoms of Muscle Pain, Muscle Weakness. Neurologic Denies complaints or symptoms of Numbness/parasthesias. Psychiatric Denies complaints or symptoms of Claustrophobia, Suicidal. Objective Constitutional Sitting or standing Blood Pressure is within target range for patient.. Pulse regular and within target range for patient.Marland Kitchen Respirations regular, non-labored and within target range.. Temperature is normal and within the target range for the patient.Marland Kitchen Appears in no distress. Vitals Time Taken: 11:00 AM, Height: 63 in, Source: Stated, Weight: 220 lbs, Source: Stated, BMI: 39, Temperature: 97.9 F, Pulse: 84 bpm, Respiratory Rate: 18 breaths/min, Blood Pressure: 137/72 mmHg, Capillary Blood Glucose: 83 mg/dl. General Notes: patient stated CBG this morning was  83 Respiratory work of breathing is normal. Bilateral breath sounds are clear and equal in all lobes with no wheezes, rales or rhonchi.. Cardiovascular 3 out of 6 systolic ejection murmur. JVP marginal at 45 degrees. No sacral edema. Pedal pulses easily palpable bilaterally. Nonpitting edema of the left greater than right leg. Musculoskeletal Valgus deformity at the left ankle which I am assuming is Charcot although she says that this was evaluated at [facility] some years ago and it was not. Neurological Absent vibration and microfilament test. Psychiatric appears at normal baseline. General Notes: Wound exam  ooThe left is actually quite a bit worse with innumerable wounds on the medial and posterior part of the calf. These extend laterally. Very significant venous inflammation ooOn the right medial lower leg a scattering of small wounds Integumentary (Hair, Skin) Changes of significant chronic venous insufficiency with stasis dermatitis in the left lower leg greater than the right. Wound #1 status is Open. Original cause of wound was Gradually Appeared. The wound is located on the Left,Circumferential Lower Leg. The wound measures 13cm length x 22cm width x 0.1cm depth; 224.624cm^2 area and 22.462cm^3 volume. There is Fat Layer (Subcutaneous Tissue) Exposed exposed. There is no tunneling or undermining noted. There is a medium amount of serosanguineous drainage noted. There is small (1-33%) red, pink granulation within the wound bed. There is a large (67-100%) amount of necrotic tissue within the wound bed including Adherent Slough. Wound #2 status is Open. Original cause of wound was Gradually Appeared. The wound is located on the Right,Medial Lower Leg. The wound measures 8cm length x 8.5cm width x 0.1cm depth; 53.407cm^2 area and 5.341cm^3 volume. There is Fat Layer (Subcutaneous Tissue) Exposed exposed. There is no tunneling or undermining noted. There is a medium amount of serosanguineous drainage noted. The wound margin is flat and intact. There is medium (34-66%) red granulation within the wound bed. There is a medium (34-66%) amount of necrotic tissue within the wound bed including Adherent Slough. Assessment Active Problems ICD-10 Chronic venous hypertension (idiopathic) with ulcer and inflammation of bilateral lower extremity Type 2 diabetes mellitus with other skin ulcer Non-pressure chronic ulcer of left calf limited to breakdown of skin Non-pressure chronic ulcer of right calf with other specified severity Lymphedema, not elsewhere classified Procedures Wound #1 Pre-procedure  diagnosis of Wound #1 is a Diabetic Wound/Ulcer of the Lower Extremity located on the Left,Circumferential Lower Leg . There was a Three Layer Compression Therapy Procedure by Levan Hurst, RN. Post procedure Diagnosis Wound #1: Same as Pre-Procedure Wound #2 Pre-procedure diagnosis of Wound #2 is a Diabetic Wound/Ulcer of the Lower Extremity located on the Right,Medial Lower Leg . There was a Three Layer Compression Therapy Procedure by Levan Hurst, RN. Post procedure Diagnosis Wound #2: Same as Pre-Procedure Plan Follow-up Appointments: Return Appointment in 1 week. Dressing Change Frequency: Wound #1 Left,Circumferential Lower Leg: Do not change entire dressing for one week. Wound #2 Right,Medial Lower Leg: Do not change entire dressing for one week. Skin Barriers/Peri-Wound Care: Moisturizing lotion - both legs TCA Cream or Ointment - mixed with lotion Wound Cleansing: May shower with protection. - use cast protector Primary Wound Dressing: Wound #1 Left,Circumferential Lower Leg: Calcium Alginate with Silver Wound #2 Right,Medial Lower Leg: Calcium Alginate with Silver Secondary Dressing: Wound #1 Left,Circumferential Lower Leg: ABD pad Zetuvit or Kerramax Wound #2 Right,Medial Lower Leg: ABD pad Edema Control: 3 Layer Compression System - Bilateral Avoid standing for long periods of time Elevate legs to the level of the heart  or above for 30 minutes daily and/or when sitting, a frequency of: - throughout the day Exercise regularly 1. I am going to use silver alginate/Kerramax/ABDs under 3 layer compression 2. We did not have ABIs but I do not suspect she has a significant arterial issue. Therefore the 3 layer compression 3. She has severe venous hypertension with stasis dermatitis and lymphedema. I told her that without edema control there was no hope of healing this and she is likely to get a circumferential area of skin breakdown on the left 4. I have also urged  her to keep her legs elevated. She sleeps in a recliner with her legs dependent. 5. The other the difficult thing about compression with this woman is her restless leg syndrome. This may pose a problem. Many people with restless leg syndrome do not tolerate compression, total contact casting or anything that they can identify as confining 6. I did not see any evidence of congestive heart failure. She does have a harsh murmur I will check an echocardiogram before I next see her 7. We are going to try to reach out to get her vein and arterial studies done in [place]. If she is a candidate for ablation I would like to get this done as soon as possible I spent 45 minutes in review of this patient's past medical history, face-to-face evaluation and preparation of this record Electronic Signature(s) Signed: 03/29/2020 6:00:01 PM By: Linton Ham MD Entered By: Linton Ham on 03/28/2020 13:03:16 -------------------------------------------------------------------------------- HxROS Details Patient Name: Date of Service: [name] 03/28/2020 10:30 A M Medical Record Number: [id number] Patient Account Number: [id number] Date of Birth/Sex: Treating RN: Sep 24, 1940 (79 y.o. [name] Primary Care Provider: Margaretha Sheffield Other Clinician: Referring Provider: Treating Provider/Extender: Benedetto Coons in Treatment: 0 Information Obtained From Patient Constitutional Symptoms (General Health) Complaints and Symptoms: Negative for: Fatigue; Fever; Chills; Marked Weight Change Eyes Complaints and Symptoms: Positive for: Glasses / Contacts Negative for: Dry Eyes; Vision Changes Medical History: Negative for: Cataracts; Glaucoma; Optic Neuritis Ear/Nose/Mouth/Throat Complaints and Symptoms: Negative for: Chronic sinus problems or rhinitis Medical History: Negative for: Chronic sinus problems/congestion; Middle ear problems Respiratory Complaints and  Symptoms: Negative for: Chronic or frequent coughs; Shortness of Breath Medical History: Negative for: Aspiration; Asthma; Chronic Obstructive Pulmonary Disease (COPD); Pneumothorax; Sleep Apnea; Tuberculosis Cardiovascular Complaints and Symptoms: Negative for: Chest pain Medical History: Positive for: Congestive Heart Failure; Hypertension; Peripheral Venous Disease Negative for: Angina; Arrhythmia; Coronary Artery Disease; Deep Vein Thrombosis; Hypotension; Myocardial Infarction; Peripheral Arterial Disease; Phlebitis; Vasculitis Gastrointestinal Complaints and Symptoms: Negative for: Frequent diarrhea; Nausea; Vomiting Medical History: Negative for: Cirrhosis ; Colitis; Crohns; Hepatitis A; Hepatitis B; Hepatitis C Endocrine Complaints and Symptoms: Negative for: Heat/cold intolerance Medical History: Positive for: Type II Diabetes Negative for: Type I Diabetes Time with diabetes: 75 Treated with: Insulin Blood sugar tested every day: Yes Tested : daily Genitourinary Complaints and Symptoms: Negative for: Frequent urination Medical History: Negative for: End Stage Renal Disease Integumentary (Skin) Complaints and Symptoms: Positive for: Wounds Medical History: Negative for: History of Burn Musculoskeletal Complaints and Symptoms: Negative for: Muscle Pain; Muscle Weakness Neurologic Complaints and Symptoms: Negative for: Numbness/parasthesias Medical History: Negative for: Dementia; Neuropathy; Quadriplegia; Paraplegia; Seizure Disorder Psychiatric Complaints and Symptoms: Negative for: Claustrophobia; Suicidal Medical History: Negative for: Anorexia/bulimia; Confinement Anxiety Hematologic/Lymphatic Medical History: Negative for: Anemia; Hemophilia; Human Immunodeficiency Virus; Lymphedema; Sickle Cell Disease Immunological Medical History: Negative for: Lupus Erythematosus; Raynauds; Scleroderma Oncologic Medical History: Negative for: Received  Chemotherapy; Received Radiation Immunizations Pneumococcal Vaccine: Received Pneumococcal Vaccination: No Implantable Devices Yes Family and Social History Cancer: No; Diabetes: Yes - Siblings; Heart Disease: Yes - Mother; Hereditary Spherocytosis: No; Hypertension: Yes - Mother; Kidney Disease: Yes - Siblings; Lung Disease: No; Seizures: No; Stroke: No; Thyroid Problems: No; Tuberculosis: No; Never smoker; Marital Status - Widowed; Alcohol Use: Rarely; Drug Use: No History; Caffeine Use: Daily; Financial Concerns: No; Food, Clothing or Shelter Needs: No; Support System Lacking: No; Transportation Concerns: No Engineer, maintenance) Signed: 03/29/2020 6:00:01 PM By: Linton Ham MD Signed: 04/01/2020 5:42:58 PM By: Carlene Coria RN Entered By: Carlene Coria on 03/28/2020 11:39:37 -------------------------------------------------------------------------------- SuperBill Details Patient Name: Date of Service: [name], [name] 03/28/2020 Medical Record Number: [id number] Patient Account Number: [id number] Date of Birth/Sex: Treating RN: [birth year] (79 y.o. [name] Primary Care Provider: Margaretha Sheffield Other Clinician: Referring Provider: Treating Provider/Extender: Benedetto Coons in Treatment: 0 Diagnosis Coding ICD-10 Codes Code Description [id number] Chronic venous hypertension (idiopathic) with ulcer and inflammation of bilateral lower extremity E11.622 Type 2 diabetes mellitus with other skin ulcer L97.221 Non-pressure chronic ulcer of left calf limited to breakdown of skin L97.218 Non-pressure chronic ulcer of right calf with other specified severity I89.0 Lymphedema, not elsewhere classified Facility Procedures CPT4: Code [id number] Description: 13 - WOUND CARE VISIT-LEV 3 EST PT Modifier: 25 Quantity: 1 CPT4: [id number] Description: 81 BILATERAL: Application of multi-layer venous compression system; leg (below knee), including  ankle and t. Modifier: Quantity: 1 Physician Procedures : CPT4 Code Description Modifier [id number] - WC PHYS LEVEL 4 - NEW PT 25 ICD-10 Diagnosis Description I87.333 Chronic venous hypertension (idiopathic) with ulcer and inflammation of bilateral lower extremity L97.221 Non-pressure chronic ulcer of left  calf limited to breakdown of skin L97.218 Non-pressure chronic ulcer of right calf with other specified severity I89.0 Lymphedema, not elsewhere classified Quantity: 1 Electronic Signature(s) Signed: 03/28/2020 4:12:59 PM By: Levan Hurst RN, BSN Signed: 03/29/2020 6:00:01 PM By: Linton Ham MD Entered By: Levan Hurst on 03/28/2020 13:56:10

## 2020-04-01 NOTE — Progress Notes (Signed)
SERRENA, LINDERMAN ([id number]) Visit Report for 03/28/2020 Allergy List Details Patient Name: Date of Service: [name], [name] 03/28/2020 10:30 A M Medical Record Number: [id number] Patient Account Number: [id number] Date of Birth/Sex: Treating RN: 1940/10/05 (79 y.o. Clearnce Sorrel Primary Care Norah Fick: Margaretha Sheffield Other Clinician: Referring Jovonne Wilton: Treating Tandy Lewin/Extender: Benedetto Coons in Treatment: 0 Allergies Active Allergies penicillin codeine adhesive aspirin iodine Iodinated Contrast Media Sulfa (Sulfonamide Antibiotics) Allergy Notes Electronic Signature(s) Signed: 04/01/2020 5:42:58 PM By: Carlene Coria RN Entered By: Carlene Coria on 03/28/2020 12:17:02 -------------------------------------------------------------------------------- Arrival Information Details Patient Name: Date of Service: [name] 03/28/2020 10:30 A M Medical Record Number: [id number] Patient Account Number: [id number] Date of Birth/Sex: Treating RN: [birth year] (79 y.o. Clearnce Sorrel Primary Care Yaviel Kloster: Margaretha Sheffield Other Clinician: Referring Zaeem Kandel: Treating Sahan Pen/Extender: Benedetto Coons in Treatment: 0 Visit Information Patient Arrived: Ambulatory Arrival Time: 11:00 Accompanied By: self Transfer Assistance: None Patient Identification Verified: Yes Secondary Verification Process Completed: Yes Patient Requires Transmission-Based Precautions: No Electronic Signature(s) Signed: 03/28/2020 4:04:58 PM By: Kela Millin Entered By: Kela Millin on 03/28/2020 11:01:42 -------------------------------------------------------------------------------- Clinic Level of Care Assessment Details Patient Name: Date of Service: [name], [name] 03/28/2020 10:30 A M Medical Record Number: [id number] Patient Account Number: [id number] Date of Birth/Sex: Treating RN: [birth year] (79 y.o. [name] Primary  Care Jiyan Walkowski: Margaretha Sheffield Other Clinician: Referring Cherae Marton: Treating Shawan Corella/Extender: Benedetto Coons in Treatment: 0 Clinic Level of Care Assessment Items TOOL 1 Quantity Score X- 1 0 Use when EandM and Procedure is performed on INITIAL visit ASSESSMENTS - Nursing Assessment / Reassessment X- 1 20 General Physical Exam (combine w/ comprehensive assessment (listed just below) when performed on new pt. evals) X- 1 25 Comprehensive Assessment (HX, ROS, Risk Assessments, Wounds Hx, etc.) ASSESSMENTS - Wound and Skin Assessment / Reassessment []  - 0 Dermatologic / Skin Assessment (not related to wound area) ASSESSMENTS - Ostomy and/or Continence Assessment and Care []  - 0 Incontinence Assessment and Management []  - 0 Ostomy Care Assessment and Management (repouching, etc.) PROCESS - Coordination of Care X - Simple Patient / Family Education for ongoing care 1 15 []  - 0 Complex (extensive) Patient / Family Education for ongoing care X- 1 10 Staff obtains Programmer, systems, Records, T Results / Process Orders est []  - 0 Staff telephones HHA, Nursing Homes / Clarify orders / etc []  - 0 Routine Transfer to another Facility (non-emergent condition) []  - 0 Routine Hospital Admission (non-emergent condition) X- 1 15 New Admissions / Biomedical engineer / Ordering NPWT Apligraf, etc. , []  - 0 Emergency Hospital Admission (emergent condition) PROCESS - Special Needs []  - 0 Pediatric / Minor Patient Management []  - 0 Isolation Patient Management []  - 0 Hearing / Language / Visual special needs []  - 0 Assessment of Community assistance (transportation, D/C planning, etc.) []  - 0 Additional assistance / Altered mentation []  - 0 Support Surface(s) Assessment (bed, cushion, seat, etc.) INTERVENTIONS - Miscellaneous []  - 0 External ear exam []  - 0 Patient Transfer (multiple staff / Civil Service fast streamer / Similar devices) []  - 0 Simple Staple / Suture  removal (25 or less) []  - 0 Complex Staple / Suture removal (26 or more) []  - 0 Hypo/Hyperglycemic Management (do not check if billed separately) []  - 0 Ankle / Brachial Index (ABI) - do not check if billed separately Has the patient been seen at the hospital within the last three years: Yes Total Score: 85 Level Of Care: New/Established - Level  3 Electronic Signature(s) Signed: 03/28/2020 4:12:59 PM By: Levan Hurst RN, BSN Entered By: Levan Hurst on 03/28/2020 12:25:48 -------------------------------------------------------------------------------- Compression Therapy Details Patient Name: Date of Service: [name], [name] 03/28/2020 10:30 A M Medical Record Number: [id number] Patient Account Number: [id number] Date of Birth/Sex: Treating RN: 01-01-1941 (79 y.o. [name] Primary Care Breona Cherubin: Margaretha Sheffield Other Clinician: Referring Caterra Ostroff: Treating Deshana Rominger/Extender: Benedetto Coons in Treatment: 0 Compression Therapy Performed for Wound Assessment: Wound #1 Left,Circumferential Lower Leg Performed By: Clinician Levan Hurst, RN Compression Type: Three Layer Post Procedure Diagnosis Same as Pre-procedure Electronic Signature(s) Signed: 03/28/2020 4:12:59 PM By: Levan Hurst RN, BSN Entered By: Levan Hurst on 03/28/2020 12:25:00 -------------------------------------------------------------------------------- Compression Therapy Details Patient Name: Date of Service: [name], [name] 03/28/2020 10:30 A M Medical Record Number: [id number] Patient Account Number: [id number] Date of Birth/Sex: Treating RN: 07-30-1941 (79 y.o. [name] Primary Care Saryna Kneeland: Margaretha Sheffield Other Clinician: Referring Jorge Amparo: Treating Kimaya Whitlatch/Extender: Benedetto Coons in Treatment: 0 Compression Therapy Performed for Wound Assessment: Wound #2 Right,Medial Lower Leg Performed By: Clinician Levan Hurst,  RN Compression Type: Three Layer Post Procedure Diagnosis Same as Pre-procedure Electronic Signature(s) Signed: 03/28/2020 4:12:59 PM By: Levan Hurst RN, BSN Entered By: Levan Hurst on 03/28/2020 12:25:00 -------------------------------------------------------------------------------- Encounter Discharge Information Details Patient Name: Date of Service: [name], [name] 03/28/2020 10:30 A M Medical Record Number: [id number] Patient Account Number: [id number] Date of Birth/Sex: Treating RN: 05-11-1941 (79 y.o. Clearnce Sorrel Primary Care Jax Kentner: Margaretha Sheffield Other Clinician: Referring Gorge Almanza: Treating Essica Kiker/Extender: Benedetto Coons in Treatment: 0 Encounter Discharge Information Items Discharge Condition: Stable Ambulatory Status: Ambulatory Discharge Destination: Home Transportation: Private Auto Accompanied By: daughter Schedule Follow-up Appointment: Yes Clinical Summary of Care: Patient Declined Electronic Signature(s) Signed: 03/28/2020 4:04:58 PM By: Kela Millin Entered By: Kela Millin on 03/28/2020 13:14:42 -------------------------------------------------------------------------------- Lower Extremity Assessment Details Patient Name: Date of Service: [name], [name] 03/28/2020 10:30 A M Medical Record Number: [id number] Patient Account Number: [id number] Date of Birth/Sex: Treating RN: 1940-10-19 (79 y.o. [name] Primary Care Bliss Tsang: Margaretha Sheffield Other Clinician: Referring Bruce Mayers: Treating Cailyn Houdek/Extender: Benedetto Coons in Treatment: 0 Edema Assessment Assessed: Shirlyn Goltz: No] [Right: No] E[Left: dema] [Right: :] Calf Left: Right: Point of Measurement: 34 cm From Medial Instep 41 cm 39.2 cm Ankle Left: Right: Point of Measurement: 9 cm From Medial Instep 27.3 cm 28 cm Electronic Signature(s) Signed: 04/01/2020 5:42:58 PM By: Carlene Coria RN Entered By: Carlene Coria on 03/28/2020 11:46:40 -------------------------------------------------------------------------------- Multi Wound Chart Details Patient Name: Date of Service: [name], [name] 03/28/2020 10:30 A M Medical Record Number: [id number] Patient Account Number: [id number] Date of Birth/Sex: Treating RN: July 01, 1941 (79 y.o. [name] Primary Care Jack Bolio: Margaretha Sheffield Other Clinician: Referring Delitha Elms: Treating Cale Decarolis/Extender: Benedetto Coons in Treatment: 0 Vital Signs Height(in): 28 Capillary Blood Glucose(mg/dl): 83 Weight(lbs): 220 Pulse(bpm): 70 Body Mass Index(BMI): 77 Blood Pressure(mmHg): 137/72 Temperature(F): 97.9 Respiratory Rate(breaths/min): 18 Photos: [1:No Photos Left, Circumferential Lower Leg] [2:No Photos Right, Medial Lower Leg] [N/A:N/A N/A] Wound Location: [1:Gradually Appeared] [2:Gradually Appeared] [N/A:N/A] Wounding Event: [1:Diabetic Wound/Ulcer of the Lower] [2:Diabetic Wound/Ulcer of the Lower] [N/A:N/A] Primary Etiology: [1:Extremity Congestive Heart Failure,] [2:Extremity Congestive Heart Failure,] [N/A:N/A] Comorbid History: [1:Hypertension, Peripheral Venous Disease, Type II Diabetes 10/12/2019] [2:Hypertension, Peripheral Venous Disease, Type II Diabetes 11/09/2019] [N/A:N/A] Date Acquired: [1:0] [2:0] [N/A:N/A] Weeks of Treatment: [1:Open] [2:Open] [N/A:N/A] Wound Status: [1:Yes] [2:No] [N/A:N/A] Clustered Wound: [1:13x22x0.1] [2:8x8.5x0.1] [N/A:N/A] Measurements L x  W x D (cm) [1:224.624] [2:53.407] [N/A:N/A] A (cm) : rea [1:22.462] [2:5.341] [N/A:N/A] Volume (cm) : [1:Grade 2] [2:Grade 2] [N/A:N/A] Classification: [1:Medium] [2:Medium] [N/A:N/A] Exudate A mount: [1:Serosanguineous] [2:Serosanguineous] [N/A:N/A] Exudate Type: [1:red, brown] [2:red, brown] [N/A:N/A] Exudate Color: [1:N/A] [2:Flat and Intact] [N/A:N/A] Wound Margin: [1:Small (1-33%)] [2:Medium (34-66%)] [N/A:N/A] Granulation A mount:  [1:Red, Pink] [2:Red] [N/A:N/A] Granulation Quality: [1:Large (67-100%)] [2:Medium (34-66%)] [N/A:N/A] Necrotic A mount: [1:Fat Layer (Subcutaneous Tissue)] [2:Fat Layer (Subcutaneous Tissue)] [N/A:N/A] Exposed Structures: [1:Exposed: Yes Fascia: No Tendon: No Muscle: No Joint: No Bone: No None] [2:Exposed: Yes Fascia: No Tendon: No Muscle: No Joint: No Bone: No None] [N/A:N/A] Epithelialization: [1:Compression Therapy] [2:Compression Therapy] [N/A:N/A] Treatment Notes Electronic Signature(s) Signed: 03/28/2020 4:12:59 PM By: Levan Hurst RN, BSN Signed: 03/29/2020 6:00:01 PM By: Linton Ham MD Entered By: Linton Ham on 03/28/2020 12:43:31 -------------------------------------------------------------------------------- Multi-Disciplinary Care Plan Details Patient Name: Date of Service: [name], [name] 03/28/2020 10:30 A M Medical Record Number: [id number] Patient Account Number: [id number] Date of Birth/Sex: Treating RN: [birth year] (79 y.o. [name] Primary Care Ivis Henneman: Margaretha Sheffield Other Clinician: Referring [place] Merida: Treating Arjuna Doeden/Extender: Benedetto Coons in Treatment: 0 Active Inactive Abuse / Safety / Falls / Self Care Management Nursing Diagnoses: Potential for falls Potential for injury related to falls Goals: Patient will not experience any injury related to falls Date Initiated: 03/28/2020 Target Resolution Date: 04/29/2020 Goal Status: Active Patient/caregiver will verbalize/demonstrate measures taken to prevent injury and/or falls Date Initiated: 03/28/2020 Target Resolution Date: 04/29/2020 Goal Status: Active Interventions: Assess Activities of Daily Living upon admission and as needed Assess fall risk on admission and as needed Assess: immobility, friction, shearing, incontinence upon admission and as needed Assess impairment of mobility on admission and as needed per policy Assess personal safety and home safety  (as indicated) on admission and as needed Assess self care needs on admission and as needed Provide education on fall prevention Provide education on personal and home safety Notes: Nutrition Nursing Diagnoses: Imbalanced nutrition Potential for alteratiion in Nutrition/Potential for imbalanced nutrition Goals: Patient/caregiver agrees to and verbalizes understanding of need to use nutritional supplements and/or vitamins as prescribed Date Initiated: 03/28/2020 Target Resolution Date: 04/29/2020 Goal Status: Active Patient/caregiver will maintain therapeutic glucose control Date Initiated: 03/28/2020 Target Resolution Date: 04/29/2020 Goal Status: Active Interventions: Assess HgA1c results as ordered upon admission and as needed Assess patient nutrition upon admission and as needed per policy Provide education on elevated blood sugars and impact on wound healing Provide education on nutrition Notes: Venous Leg Ulcer Nursing Diagnoses: Knowledge deficit related to disease process and management Potential for venous Insuffiency (use before diagnosis confirmed) Goals: Patient will maintain optimal edema control Date Initiated: 03/28/2020 Target Resolution Date: 04/29/2020 Goal Status: Active Patient/caregiver will verbalize understanding of disease process and disease management Date Initiated: 03/28/2020 Target Resolution Date: 04/29/2020 Goal Status: Active Interventions: Assess peripheral edema status every visit. Compression as ordered Provide education on venous insufficiency Notes: Wound/Skin Impairment Nursing Diagnoses: Impaired tissue integrity Knowledge deficit related to ulceration/compromised skin integrity Goals: Patient/caregiver will verbalize understanding of skin care regimen Date Initiated: 03/28/2020 Target Resolution Date: 04/29/2020 Goal Status: Active Interventions: Assess patient/caregiver ability to obtain necessary supplies Assess patient/caregiver  ability to perform ulcer/skin care regimen upon admission and as needed Assess ulceration(s) every visit Provide education on ulcer and skin care Notes: Electronic Signature(s) Signed: 03/28/2020 4:12:59 PM By: Levan Hurst RN, BSN Entered By: Levan Hurst on 03/28/2020 12:19:10 -------------------------------------------------------------------------------- Pain Assessment Details Patient Name: Date of Service: [name]  03/28/2020 10:30 A M Medical Record Number: [id number] Patient Account Number: [id number] Date of Birth/Sex: Treating RN: 1941-05-15 (80 y.o. [name] Primary Care Denys Salinger: Margaretha Sheffield Other Clinician: Referring Salvatore Poe: Treating Cathaleen Korol/Extender: Benedetto Coons in Treatment: 0 Active Problems Location of Pain Severity and Description of Pain Patient Has Paino No Site Locations Pain Management and Medication Current Pain Management: Electronic Signature(s) Signed: 04/01/2020 5:42:58 PM By: Carlene Coria RN Entered By: Carlene Coria on 03/28/2020 11:53:03 -------------------------------------------------------------------------------- Patient/Caregiver Education Details Patient Name: Date of Service: [name] 7/19/2021andnbsp10:30 A M Medical Record Number: [id number] Patient Account Number: [id number] Date of Birth/Gender: Treating RN: 1941-09-03 (79 y.o. [name] Primary Care Physician: Margaretha Sheffield Other Clinician: Referring Physician: Treating Physician/Extender: Benedetto Coons in Treatment: 0 Education Assessment Education Provided To: Patient Education Topics Provided Elevated Blood Sugar/ Impact on Healing: Methods: Explain/Verbal Responses: State content correctly Nutrition: Methods: Explain/Verbal Responses: State content correctly Venous: Methods: Explain/Verbal Responses: State content correctly Wound/Skin Impairment: Methods:  Explain/Verbal Responses: State content correctly Electronic Signature(s) Signed: 03/28/2020 4:12:59 PM By: Levan Hurst RN, BSN Entered By: Levan Hurst on 03/28/2020 12:20:47 -------------------------------------------------------------------------------- Wound Assessment Details Patient Name: Date of Service: [name], [name] 03/28/2020 10:30 A M Medical Record Number: [id number] Patient Account Number: [id number] Date of Birth/Sex: Treating RN: 1941-07-12 (79 y.o. [name] Primary Care Katsumi Wisler: Margaretha Sheffield Other Clinician: Referring Jennie Bolar: Treating Nykeria Mealing/Extender: Benedetto Coons in Treatment: 0 Wound Status Wound Number: 1 Primary Diabetic Wound/Ulcer of the Lower Extremity Etiology: Wound Location: Left, Circumferential Lower Leg Wound Open Wounding Event: Gradually Appeared Status: Date Acquired: 10/12/2019 Comorbid Congestive Heart Failure, Hypertension, Peripheral Venous Weeks Of Treatment: 0 History: Disease, Type II Diabetes Clustered Wound: Yes Photos Photo Uploaded By: Mikeal Hawthorne on 03/28/2020 13:37:42 Wound Measurements Length: (cm) 13 Width: (cm) 22 Depth: (cm) 0.1 Area: (cm) 224.624 Volume: (cm) 22.462 % Reduction in Area: % Reduction in Volume: Epithelialization: None Tunneling: No Undermining: No Wound Description Classification: Grade 2 Exudate Amount: Medium Exudate Type: Serosanguineous Exudate Color: red, brown Foul Odor After Cleansing: No Slough/Fibrino Yes Wound Bed Granulation Amount: Small (1-33%) Exposed Structure Granulation Quality: Red, Pink Fascia Exposed: No Necrotic Amount: Large (67-100%) Fat Layer (Subcutaneous Tissue) Exposed: Yes Necrotic Quality: Adherent Slough Tendon Exposed: No Muscle Exposed: No Joint Exposed: No Bone Exposed: No Treatment Notes Wound #1 (Left, Circumferential Lower Leg) 1. Cleanse With Wound Cleanser 2. Periwound Care Moisturizing lotion TCA  Cream 3. Primary Dressing Applied Calcium Alginate Ag 4. Secondary Dressing ABD Pad Kerramax/Xtrasorb 6. Support Layer Applied 3 layer compression wrap Notes netting Electronic Signature(s) Signed: 04/01/2020 5:42:58 PM By: Carlene Coria RN Entered By: Carlene Coria on 03/28/2020 11:49:58 -------------------------------------------------------------------------------- Wound Assessment Details Patient Name: Date of Service: [name], [name] 03/28/2020 10:30 A M Medical Record Number: [id number] Patient Account Number: [id number] Date of Birth/Sex: Treating RN: 12-28-1940 (79 y.o. [name] Primary Care Yoshino Broccoli: Margaretha Sheffield Other Clinician: Referring Yanis Larin: Treating Padraic Marinos/Extender: Benedetto Coons in Treatment: 0 Wound Status Wound Number: 2 Primary Diabetic Wound/Ulcer of the Lower Extremity Etiology: Wound Location: Right, Medial Lower Leg Wound Open Wounding Event: Gradually Appeared Status: Date Acquired: 11/09/2019 Comorbid Congestive Heart Failure, Hypertension, Peripheral Venous Weeks Of Treatment: 0 History: Disease, Type II Diabetes Clustered Wound: No Photos Photo Uploaded By: Mikeal Hawthorne on 03/28/2020 13:37:43 Wound Measurements Length: (cm) 8 Width: (cm) 8.5 Depth: (cm) 0.1 Area: (cm) 53.407 Volume: (cm) 5.341 % Reduction in Area: % Reduction in Volume: Epithelialization:  None Tunneling: No Undermining: No Wound Description Classification: Grade 2 Wound Margin: Flat and Intact Exudate Amount: Medium Exudate Type: Serosanguineous Exudate Color: red, brown Foul Odor After Cleansing: No Slough/Fibrino Yes Wound Bed Granulation Amount: Medium (34-66%) Exposed Structure Granulation Quality: Red Fascia Exposed: No Necrotic Amount: Medium (34-66%) Fat Layer (Subcutaneous Tissue) Exposed: Yes Necrotic Quality: Adherent Slough Tendon Exposed: No Muscle Exposed: No Joint Exposed: No Bone Exposed: No Treatment  Notes Wound #2 (Right, Medial Lower Leg) 1. Cleanse With Wound Cleanser 2. Periwound Care Moisturizing lotion TCA Cream 3. Primary Dressing Applied Calcium Alginate Ag 4. Secondary Dressing ABD Pad Kerramax/Xtrasorb 6. Support Layer Applied 3 layer compression wrap Notes netting Electronic Signature(s) Signed: 04/01/2020 5:42:58 PM By: Carlene Coria RN Entered By: Carlene Coria on 03/28/2020 11:52:32 -------------------------------------------------------------------------------- Vitals Details Patient Name: Date of Service: [name], [name] 03/28/2020 10:30 A M Medical Record Number: [id number] Patient Account Number: [id number] Date of Birth/Sex: Treating RN: December 19, 1940 (79 y.o. Clearnce Sorrel Primary Care Kashara Blocher: Margaretha Sheffield Other Clinician: Referring Dondrea Clendenin: Treating Kailash Hinze/Extender: Benedetto Coons in Treatment: 0 Vital Signs Time Taken: 11:00 Temperature (F): 97.9 Height (in): 63 Pulse (bpm): 84 Source: Stated Respiratory Rate (breaths/min): 18 Weight (lbs): 220 Blood Pressure (mmHg): 137/72 Source: Stated Capillary Blood Glucose (mg/dl): 83 Body Mass Index (BMI): 39 Reference Range: 80 - 120 mg / dl Notes patient stated CBG this morning was 83 Electronic Signature(s) Signed: 03/28/2020 4:04:58 PM By: Kela Millin Entered By: Kela Millin on 03/28/2020 11:04:06

## 2020-04-04 ENCOUNTER — Encounter (HOSPITAL_BASED_OUTPATIENT_CLINIC_OR_DEPARTMENT_OTHER): Payer: Medicare PPO | Admitting: Internal Medicine

## 2020-04-04 DIAGNOSIS — E11622 Type 2 diabetes mellitus with other skin ulcer: Secondary | ICD-10-CM | POA: Diagnosis not present

## 2020-04-05 ENCOUNTER — Encounter (HOSPITAL_BASED_OUTPATIENT_CLINIC_OR_DEPARTMENT_OTHER): Payer: Medicare PPO | Admitting: Internal Medicine

## 2020-04-06 NOTE — Progress Notes (Signed)
[name], [name] ([id number]) Visit Report for 04/04/2020 Arrival Information Details Patient Name: Date of Service: [name], [name] 04/04/2020 3:00 PM Medical Record Number: [id number] Patient Account Number: [id number] Date of Birth/Sex: Treating RN: [birth year] (79 y.o. Clearnce Sorrel Primary Care Kanon Novosel: Margaretha Sheffield Other Clinician: Referring Kalin Amrhein: Treating Shelma Eiben/Extender: Benedetto Coons in Treatment: 1 Visit Information History Since Last Visit Added or deleted any medications: No Patient Arrived: Ambulatory Any new allergies or adverse reactions: No Arrival Time: 16:06 Had a fall or experienced change in No Accompanied By: friend activities of daily living that may affect Transfer Assistance: None risk of falls: Patient Identification Verified: Yes Signs or symptoms of abuse/neglect since last visito No Secondary Verification Process Completed: Yes Hospitalized since last visit: No Patient Requires Transmission-Based Precautions: No Implantable device outside of the clinic excluding No cellular tissue based products placed in the center since last visit: Has Dressing in Place as Prescribed: Yes Has Compression in Place as Prescribed: Yes Pain Present Now: No Electronic Signature(s) Signed: 04/04/2020 6:11:36 PM By: Kela Millin Entered By: Kela Millin on 04/04/2020 16:06:26 -------------------------------------------------------------------------------- Compression Therapy Details Patient Name: Date of Service: [name], [name] 04/04/2020 3:00 PM Medical Record Number: [id number] Patient Account Number: [id number] Date of Birth/Sex: Treating RN: 1941-05-30 (79 y.o. [name] Primary Care Laikyn Gewirtz: Margaretha Sheffield Other Clinician: Referring Chanelle Hodsdon: Treating Alexandru Moorer/Extender: Benedetto Coons in Treatment: 1 Compression Therapy Performed for Wound Assessment: Wound #1  Left,Circumferential Lower Leg Performed By: Clinician Levan Hurst, RN Compression Type: Three Layer Post Procedure Diagnosis Same as Pre-procedure Electronic Signature(s) Signed: 04/06/2020 6:13:34 PM By: Levan Hurst RN, BSN Entered By: Levan Hurst on 04/04/2020 16:45:09 -------------------------------------------------------------------------------- Compression Therapy Details Patient Name: Date of Service: [name], [name] 04/04/2020 3:00 PM Medical Record Number: [id number] Patient Account Number: [id number] Date of Birth/Sex: Treating RN: [birth year] (79 y.o. [name] Primary Care Luwana Butrick: Margaretha Sheffield Other Clinician: Referring Mickell Birdwell: Treating Cordel Drewes/Extender: Benedetto Coons in Treatment: 1 Compression Therapy Performed for Wound Assessment: Wound #2 Right,Medial Lower Leg Performed By: Clinician Levan Hurst, RN Compression Type: Three Layer Post Procedure Diagnosis Same as Pre-procedure Electronic Signature(s) Signed: 04/06/2020 6:13:34 PM By: Levan Hurst RN, BSN Entered By: Levan Hurst on 04/04/2020 16:45:10 -------------------------------------------------------------------------------- Encounter Discharge Information Details Patient Name: Date of Service: [name], [name] 04/04/2020 3:00 PM Medical Record Number: [id number] Patient Account Number: [id number] Date of Birth/Sex: Treating RN: 10/01/1940 (79 y.o. Elam Dutch Primary Care Minh Roanhorse: Margaretha Sheffield Other Clinician: Referring Adelyne Marchese: Treating Davida Falconi/Extender: Benedetto Coons in Treatment: 1 Encounter Discharge Information Items Discharge Condition: Stable Ambulatory Status: Ambulatory Discharge Destination: Home Transportation: Private Auto Accompanied By: friend Schedule Follow-up Appointment: Yes Clinical Summary of Care: Patient Declined Electronic Signature(s) Signed: 04/04/2020 6:12:56 PM By:  Baruch Gouty RN, BSN Entered By: Baruch Gouty on 04/04/2020 17:57:47 -------------------------------------------------------------------------------- Lower Extremity Assessment Details Patient Name: Date of Service: [name], [name] 04/04/2020 3:00 PM Medical Record Number: [id number] Patient Account Number: [id number] Date of Birth/Sex: Treating RN: 13-May-1941 (79 y.o. Clearnce Sorrel Primary Care Doloris Servantes: Margaretha Sheffield Other Clinician: Referring Kayleanna Lorman: Treating Erasmo Vertz/Extender: Benedetto Coons in Treatment: 1 Edema Assessment Assessed: Shirlyn Goltz: No] Patrice Paradise: No] E[Left: dema] [Right: :] Calf Left: Right: Point of Measurement: 34 cm From Medial Instep 42 cm 39 cm Ankle Left: Right: Point of Measurement: 9 cm From Medial Instep 27 cm 27 cm Vascular Assessment Pulses: Dorsalis Pedis Palpable: [Left:Yes] [Right:Yes] Blood Pressure: Brachial: [Left:155] [Right:155] Ankle: [Left:Dorsalis Pedis: 158  1.02] [Right:Dorsalis Pedis: 138 0.89] Electronic Signature(s) Signed: 04/04/2020 6:11:36 PM By: Kela Millin Entered By: Kela Millin on 04/04/2020 16:27:05 -------------------------------------------------------------------------------- Multi Wound Chart Details Patient Name: Date of Service: [name], [name] 04/04/2020 3:00 PM Medical Record Number: [id number] Patient Account Number: [id number] Date of Birth/Sex: Treating RN: 1941-02-11 (79 y.o. [name] Primary Care Kazuko Clemence: Margaretha Sheffield Other Clinician: Referring Lurlean Kernen: Treating Quint Chestnut/Extender: Benedetto Coons in Treatment: 1 Vital Signs Height(in): 63 Capillary Blood Glucose(mg/dl): 123 Weight(lbs): 220 Pulse(bpm): 88 Body Mass Index(BMI): 39 Blood Pressure(mmHg): 155/74 Temperature(F): 98.5 Respiratory Rate(breaths/min): 19 Photos: [1:No Photos Left, Circumferential Lower Leg] [2:No Photos Right, Medial Lower Leg]  [N/A:N/A N/A] Wound Location: [1:Gradually Appeared] [2:Gradually Appeared] [N/A:N/A] Wounding Event: [1:Diabetic Wound/Ulcer of the Lower] [2:Diabetic Wound/Ulcer of the Lower] [N/A:N/A] Primary Etiology: [1:Extremity Congestive Heart Failure,] [2:Extremity Congestive Heart Failure,] [N/A:N/A] Comorbid History: [1:Hypertension, Peripheral Venous Disease, Type II Diabetes 10/12/2019] [2:Hypertension, Peripheral Venous Disease, Type II Diabetes 11/09/2019] [N/A:N/A] Date Acquired: [1:1] [2:1] [N/A:N/A] Weeks of Treatment: [1:Open] [2:Open] [N/A:N/A] Wound Status: [1:Yes] [2:No] [N/A:N/A] Clustered Wound: [1:18x25x0.1] [2:0.5x0.5x0.1] [N/A:N/A] Measurements L x W x D (cm) [4:650.354] [2:0.196] [N/A:N/A] A (cm) : rea [1:35.343] [2:0.02] [N/A:N/A] Volume (cm) : [1:-57.30%] [2:99.60%] [N/A:N/A] % Reduction in A rea: [1:-57.30%] [2:99.60%] [N/A:N/A] % Reduction in Volume: [1:Grade 2] [2:Grade 2] [N/A:N/A] Classification: [1:Large] [2:Medium] [N/A:N/A] Exudate A mount: [1:Serous] [2:Serosanguineous] [N/A:N/A] Exudate Type: [1:amber] [2:red, brown] [N/A:N/A] Exudate Color: [1:Distinct, outline attached] [2:Flat and Intact] [N/A:N/A] Wound Margin: [1:Medium (34-66%)] [2:Medium (34-66%)] [N/A:N/A] Granulation A mount: [1:Red, Pink] [2:Red] [N/A:N/A] Granulation Quality: [1:Medium (34-66%)] [2:Medium (34-66%)] [N/A:N/A] Necrotic Amount: [1:Fat Layer (Subcutaneous Tissue)] [2:Fat Layer (Subcutaneous Tissue)] [N/A:N/A] Exposed Structures: [1:Exposed: Yes Fascia: No Tendon: No Muscle: No Joint: No Bone: No None] [2:Exposed: Yes Fascia: No Tendon: No Muscle: No Joint: No Bone: No None] [N/A:N/A] Epithelialization: [1:Compression Therapy] [2:Compression Therapy] [N/A:N/A] Treatment Notes Electronic Signature(s) Signed: 04/04/2020 6:00:25 PM By: Linton Ham MD Signed: 04/06/2020 6:13:34 PM By: Levan Hurst RN, BSN Entered By: Linton Ham on 04/04/2020  17:21:59 -------------------------------------------------------------------------------- Multi-Disciplinary Care Plan Details Patient Name: Date of Service: VERLISA, VARA 04/04/2020 3:00 PM Medical Record Number: [id number] Patient Account Number: [id number] Date of Birth/Sex: Treating RN: 1940-11-06 (79 y.o. [name] Primary Care Amarii Bordas: Margaretha Sheffield Other Clinician: Referring Heavenly Christine: Treating Zoriana Oats/Extender: Benedetto Coons in Treatment: 1 Active Inactive Abuse / Safety / Falls / Self Care Management Nursing Diagnoses: Potential for falls Potential for injury related to falls Goals: Patient will not experience any injury related to falls Date Initiated: 03/28/2020 Target Resolution Date: 04/29/2020 Goal Status: Active Patient/caregiver will verbalize/demonstrate measures taken to prevent injury and/or falls Date Initiated: 03/28/2020 Target Resolution Date: 04/29/2020 Goal Status: Active Interventions: Assess Activities of Daily Living upon admission and as needed Assess fall risk on admission and as needed Assess: immobility, friction, shearing, incontinence upon admission and as needed Assess impairment of mobility on admission and as needed per policy Assess personal safety and home safety (as indicated) on admission and as needed Assess self care needs on admission and as needed Provide education on fall prevention Provide education on personal and home safety Notes: Nutrition Nursing Diagnoses: Imbalanced nutrition Potential for alteratiion in Nutrition/Potential for imbalanced nutrition Goals: Patient/caregiver agrees to and verbalizes understanding of need to use nutritional supplements and/or vitamins as prescribed Date Initiated: 03/28/2020 Target Resolution Date: 04/29/2020 Goal Status: Active Patient/caregiver will maintain therapeutic glucose control Date Initiated: 03/28/2020 Target Resolution Date:  04/29/2020 Goal Status: Active Interventions: Assess HgA1c results as  ordered upon admission and as needed Assess patient nutrition upon admission and as needed per policy Provide education on elevated blood sugars and impact on wound healing Provide education on nutrition Treatment Activities: Education provided on Nutrition : 03/28/2020 Notes: Venous Leg Ulcer Nursing Diagnoses: Knowledge deficit related to disease process and management Potential for venous Insuffiency (use before diagnosis confirmed) Goals: Patient will maintain optimal edema control Date Initiated: 03/28/2020 Target Resolution Date: 04/29/2020 Goal Status: Active Patient/caregiver will verbalize understanding of disease process and disease management Date Initiated: 03/28/2020 Target Resolution Date: 04/29/2020 Goal Status: Active Interventions: Assess peripheral edema status every visit. Compression as ordered Provide education on venous insufficiency Notes: Wound/Skin Impairment Nursing Diagnoses: Impaired tissue integrity Knowledge deficit related to ulceration/compromised skin integrity Goals: Patient/caregiver will verbalize understanding of skin care regimen Date Initiated: 03/28/2020 Target Resolution Date: 04/29/2020 Goal Status: Active Interventions: Assess patient/caregiver ability to obtain necessary supplies Assess patient/caregiver ability to perform ulcer/skin care regimen upon admission and as needed Assess ulceration(s) every visit Provide education on ulcer and skin care Notes: Electronic Signature(s) Signed: 04/06/2020 6:13:34 PM By: Levan Hurst RN, BSN Entered By: Levan Hurst on 04/04/2020 16:39:25 -------------------------------------------------------------------------------- Pain Assessment Details Patient Name: Date of Service: MAITRI, SCHNOEBELEN 04/04/2020 3:00 PM Medical Record Number: [id number] Patient Account Number: [id number] Date of Birth/Sex: Treating RN: July 10, 1941  (78 y.o. Clearnce Sorrel Primary Care Anish Vana: Margaretha Sheffield Other Clinician: Referring Lajoya Dombek: Treating Calisa Luckenbaugh/Extender: Benedetto Coons in Treatment: 1 Active Problems Location of Pain Severity and Description of Pain Patient Has Paino No Site Locations Pain Management and Medication Current Pain Management: Electronic Signature(s) Signed: 04/04/2020 6:11:36 PM By: Kela Millin Entered By: Kela Millin on 04/04/2020 16:07:05 -------------------------------------------------------------------------------- Patient/Caregiver Education Details Patient Name: Date of Service: HYLTO N, Janisha 7/26/2021andnbsp3:00 PM Medical Record Number: [id number] Patient Account Number: [id number] Date of Birth/Gender: Treating RN: September 18, 1940 (79 y.o. [name] Primary Care Physician: Margaretha Sheffield Other Clinician: Referring Physician: Treating Physician/Extender: Benedetto Coons in Treatment: 1 Education Assessment Education Provided To: Patient Education Topics Provided Safety: Methods: Explain/Verbal Responses: State content correctly Venous: Methods: Explain/Verbal Responses: State content correctly Wound/Skin Impairment: Methods: Explain/Verbal Responses: State content correctly Electronic Signature(s) Signed: 04/06/2020 6:13:34 PM By: Levan Hurst RN, BSN Entered By: Levan Hurst on 04/04/2020 16:39:43 -------------------------------------------------------------------------------- Wound Assessment Details Patient Name: Date of Service: MACHELE, DEIHL 04/04/2020 3:00 PM Medical Record Number: [id number] Patient Account Number: [id number] Date of Birth/Sex: Treating RN: 1941/05/09 (79 y.o. Clearnce Sorrel Primary Care Emmalou Hunger: Margaretha Sheffield Other Clinician: Referring Brailyn Killion: Treating Aranza Geddes/Extender: Benedetto Coons in Treatment: 1 Wound Status Wound  Number: 1 Primary Diabetic Wound/Ulcer of the Lower Extremity Etiology: Wound Location: Left, Circumferential Lower Leg Wound Open Wounding Event: Gradually Appeared Status: Date Acquired: 10/12/2019 Comorbid Congestive Heart Failure, Hypertension, Peripheral Venous Weeks Of Treatment: 1 History: Disease, Type II Diabetes Clustered Wound: Yes Wound Measurements Length: (cm) 18 Width: (cm) 25 Depth: (cm) 0.1 Area: (cm) 353.429 Volume: (cm) 35.343 % Reduction in Area: -57.3% % Reduction in Volume: -57.3% Epithelialization: None Tunneling: No Undermining: No Wound Description Classification: Grade 2 Wound Margin: Distinct, outline attached Exudate Amount: Large Exudate Type: Serous Exudate Color: amber Foul Odor After Cleansing: No Slough/Fibrino Yes Wound Bed Granulation Amount: Medium (34-66%) Exposed Structure Granulation Quality: Red, Pink Fascia Exposed: No Necrotic Amount: Medium (34-66%) Fat Layer (Subcutaneous Tissue) Exposed: Yes Necrotic Quality: Adherent Slough Tendon Exposed: No Muscle Exposed: No Joint Exposed: No Bone Exposed: No Treatment Notes Wound #1 (Left, Circumferential  Lower Leg) 2. Periwound Care Moisturizing lotion TCA Cream 3. Primary Dressing Applied Calcium Alginate Ag 4. Secondary Dressing ABD Pad Other secondary dressing (specify in notes) 6. Support Layer Applied 3 layer compression wrap Notes zetuvit, netting Electronic Signature(s) Signed: 04/04/2020 6:11:36 PM By: Kela Millin Entered By: Kela Millin on 04/04/2020 16:27:53 -------------------------------------------------------------------------------- Wound Assessment Details Patient Name: Date of Service: MURLE, HELLSTROM 04/04/2020 3:00 PM Medical Record Number: [id number] Patient Account Number: [id number] Date of Birth/Sex: Treating RN: Feb 21, 1941 (79 y.o. Clearnce Sorrel Primary Care Tamanna Whitson: Margaretha Sheffield Other Clinician: Referring  Ollivander See: Treating Fiza Nation/Extender: Benedetto Coons in Treatment: 1 Wound Status Wound Number: 2 Primary Diabetic Wound/Ulcer of the Lower Extremity Etiology: Wound Location: Right, Medial Lower Leg Wound Open Wounding Event: Gradually Appeared Status: Date Acquired: 11/09/2019 Comorbid Congestive Heart Failure, Hypertension, Peripheral Venous Weeks Of Treatment: 1 History: Disease, Type II Diabetes Clustered Wound: No Photos Photo Uploaded By: Mikeal Hawthorne on 04/06/2020 12:32:50 Wound Measurements Length: (cm) 0.5 % Re Width: (cm) 0.5 % Re Depth: (cm) 0.1 Epit Area: (cm) 0.196 Tun Volume: (cm) 0.02 Und duction in Area: 99.6% duction in Volume: 99.6% helialization: None neling: No ermining: No Wound Description Classification: Grade 2 Foul Wound Margin: Flat and Intact Slou Exudate Amount: Medium Exudate Type: Serosanguineous Exudate Color: red, brown Odor After Cleansing: No gh/Fibrino Yes Wound Bed Granulation Amount: Medium (34-66%) Exposed Structure Granulation Quality: Red Fascia Exposed: No Necrotic Amount: Medium (34-66%) Fat Layer (Subcutaneous Tissue) Exposed: Yes Necrotic Quality: Adherent Slough Tendon Exposed: No Muscle Exposed: No Joint Exposed: No Bone Exposed: No Treatment Notes Wound #2 (Right, Medial Lower Leg) 2. Periwound Care Moisturizing lotion TCA Cream 3. Primary Dressing Applied Calcium Alginate Ag 4. Secondary Dressing Dry Gauze 6. Support Layer Applied 3 layer compression wrap Notes netting Electronic Signature(s) Signed: 04/04/2020 6:11:36 PM By: Kela Millin Entered By: Kela Millin on 04/04/2020 16:28:41 -------------------------------------------------------------------------------- Vitals Details Patient Name: Date of Service: Sharlee Blew, GWENDOLYNN 04/04/2020 3:00 PM Medical Record Number: [id number] Patient Account Number: [id number] Date of Birth/Sex: Treating RN: 06/15/1941 (79 y.o. Clearnce Sorrel Primary Care Preston Garabedian: Margaretha Sheffield Other Clinician: Referring Calel Pisarski: Treating Deondre Marinaro/Extender: Benedetto Coons in Treatment: 1 Vital Signs Time Taken: 16:05 Temperature (F): 98.5 Height (in): 63 Pulse (bpm): 88 Weight (lbs): 220 Respiratory Rate (breaths/min): 19 Body Mass Index (BMI): 39 Blood Pressure (mmHg): 155/74 Capillary Blood Glucose (mg/dl): 123 Reference Range: 80 - 120 mg / dl Notes patient stated last CBG was 123 Electronic Signature(s) Signed: 04/04/2020 6:11:36 PM By: Kela Millin Entered By: Kela Millin on 04/04/2020 16:07:00

## 2020-04-06 NOTE — Progress Notes (Signed)
[name] ([id number]) Visit Report for 04/04/2020 HPI Details Patient Name: Date of Service: [name] 04/04/2020 3:00 PM Medical Record Number: [id number] Patient Account Number: [id number] Date of Birth/Sex: Treating RN: 1941-03-10 (79 y.o. [name] Primary Care Provider: Margaretha Sheffield Other Clinician: Referring Provider: Treating Provider/Extender: Benedetto Coons in Treatment: 1 History of Present Illness HPI Description: ADMISSION 03/28/2020 This is a 79 year old woman who is accompanied by her daughter. She is moving to [place] from [place] where she lives. Infectious been going to the wound care center in [place] for the last 2 months. She apparently did not tolerate compression early on in that clinic stay. She is just using dry gauze over the wounds when she came in today. She tells me she has had both arterial and venous reflux studies although she does not have copies of these. She was apparently offered an ablation but I have no further information on that. She also is a type II diabetic. She was noted to have wounds on her lower extremities during a CHF clinic visit with Dr. Haroldine Laws and she was referred here. She has several scattered areas on the right medial lower leg and a large area of superficial ulceration on the posterior medial left lower leg. past medical history; chronic kidney disease stage III, gastroesophageal reflux disease, type 2 diabetes with neuropathy, chronic diastolic heart failure, obstructive sleep apnea, hypertension, restless leg syndrome, pulmonary venous hypertension, history of colon CA, pacemaker and apparently venous reflux. We did not do arterial studies in the clinic today we are going to try to get the results that were already done within the last 2 months in [place] 7/26; patient readmitted to the clinic last week with predominantly chronic venous wounds almost circumferentially in  the left lower leg and the right leg medially. She also has secondary lymphedema. We put her in compression. She went to the ER in [place] on Friday they remove the wrap on the left leg diagnosed her with cellulitis and put her on doxycycline. They apparently also did a ultrasound that was negative for DVT . We did not get any vascular information from [facility] where she apparently had arterial studies and venous studies. She is currently moving from [employer] to [place] is up on her feet quite a bit. She was referred to vein and vascular here but never got to the appointment. ABIs were obtained here at 0.89 on the right and 1.02 on the left Electronic Signature(s) Signed: 04/04/2020 6:00:25 PM By: Linton Ham MD Entered By: Linton Ham on 04/04/2020 17:24:14 -------------------------------------------------------------------------------- Physical Exam Details Patient Name: Date of Service: [name] 04/04/2020 3:00 PM Medical Record Number: [id number] Patient Account Number: [id number] Date of Birth/Sex: Treating RN: [birth year] (79 y.o. [name] Primary Care Provider: Margaretha Sheffield Other Clinician: Referring Provider: Treating Provider/Extender: Benedetto Coons in Treatment: 1 Constitutional Patient is hypertensive.. Pulse regular and within target range for patient.Marland Kitchen Respirations regular, non-labored and within target range.. Temperature is normal and within the target range for the patient.Marland Kitchen Appears in no distress. Cardiovascular Both dorsalis pedis and posterior tibial pulses palpable bilaterally. Nonpitting edema. Dermatosclerosis. Notes Wound exam The patient has circumferential wounds from the medial to the lateral part of the left calf. Venous inflammation is marked especially on the left medial ankle. In general I think her erythema is better. She has improving epithelialization. The area on the right medial lower leg  looks somewhat better. Electronic Signature(s) Signed: 04/04/2020 6:00:25 PM  By: Linton Ham MD Entered By: Linton Ham on 04/04/2020 17:25:57 -------------------------------------------------------------------------------- Physician Orders Details Patient Name: Date of Service: [name] 04/04/2020 3:00 PM Medical Record Number: [id number] Patient Account Number: [id number] Date of Birth/Sex: Treating RN: Jan 09, 1941 (79 y.o. [name] Primary Care Provider: Margaretha Sheffield Other Clinician: Referring Provider: Treating Provider/Extender: Benedetto Coons in Treatment: 1 Verbal / Phone Orders: No Diagnosis Coding ICD-10 Coding Code Description [phone] Chronic venous hypertension (idiopathic) with ulcer and inflammation of bilateral lower extremity E11.622 Type 2 diabetes mellitus with other skin ulcer L97.221 Non-pressure chronic ulcer of left calf limited to breakdown of skin L97.218 Non-pressure chronic ulcer of right calf with other specified severity I89.0 Lymphedema, not elsewhere classified Follow-up Appointments ppointment in 2 weeks. - MD visit Return A Nurse Visit: - 1 week Dressing Change Frequency Wound #1 Left,Circumferential Lower Leg Do not change entire dressing for one week. Wound #2 Right,Medial Lower Leg Do not change entire dressing for one week. Skin Barriers/Peri-Wound Care Moisturizing lotion - both legs TCA Cream or Ointment - mixed with lotion Wound Cleansing May shower with protection. - use cast protector Primary Wound Dressing Wound #1 Left,Circumferential Lower Leg Calcium Alginate with Silver Wound #2 Right,Medial Lower Leg Calcium Alginate with Silver Secondary Dressing Wound #1 Left,Circumferential Lower Leg ABD pad Zetuvit or Kerramax Wound #2 Right,Medial Lower Leg ABD pad Edema Control 3 Layer Compression System - Bilateral Avoid standing for long periods of time Elevate legs to the  level of the heart or above for 30 minutes daily and/or when sitting, a frequency of: - throughout the day Exercise regularly Electronic Signature(s) Signed: 04/04/2020 6:00:25 PM By: Linton Ham MD Signed: 04/06/2020 6:13:34 PM By: Levan Hurst RN, BSN Entered By: Levan Hurst on 04/04/2020 16:44:36 -------------------------------------------------------------------------------- Problem List Details Patient Name: Date of Service: ANU, STAGNER 04/04/2020 3:00 PM Medical Record Number: [id number] Patient Account Number: [id number] Date of Birth/Sex: Treating RN: 1940-10-14 (79 y.o. [name] Primary Care Provider: Margaretha Sheffield Other Clinician: Referring Provider: Treating Provider/Extender: Benedetto Coons in Treatment: 1 Active Problems ICD-10 Encounter Code Description Active Date MDM Diagnosis I87.333 Chronic venous hypertension (idiopathic) with ulcer and inflammation of 03/28/2020 No Yes bilateral lower extremity E11.622 Type 2 diabetes mellitus with other skin ulcer 03/28/2020 No Yes L97.221 Non-pressure chronic ulcer of left calf limited to breakdown of skin 03/28/2020 No Yes L97.218 Non-pressure chronic ulcer of right calf with other specified severity 03/28/2020 No Yes I89.0 Lymphedema, not elsewhere classified 03/28/2020 No Yes Inactive Problems Resolved Problems Electronic Signature(s) Signed: 04/04/2020 6:00:25 PM By: Linton Ham MD Entered By: Linton Ham on 04/04/2020 17:21:50 -------------------------------------------------------------------------------- Progress Note Details Patient Name: Date of Service: [name] 04/04/2020 3:00 PM Medical Record Number: [id number] Patient Account Number: [id number] Date of Birth/Sex: Treating RN: 08-Oct-1940 (79 y.o. [name] Primary Care Provider: Margaretha Sheffield Other Clinician: Referring Provider: Treating Provider/Extender: Benedetto Coons in Treatment: 1 Subjective History of Present Illness (HPI) ADMISSION 03/28/2020 This is a 79 year old woman who is accompanied by her daughter. She is moving to [place] from [place] where she lives. Infectious been going to the wound care center in [place] for the last 2 months. She apparently did not tolerate compression early on in that clinic stay. She is just using dry gauze over the wounds when she came in today. She tells me she has had both arterial and venous reflux studies although she does not have copies of these. She was  apparently offered an ablation but I have no further information on that. She also is a type II diabetic. She was noted to have wounds on her lower extremities during a CHF clinic visit with Dr. Haroldine Laws and she was referred here. She has several scattered areas on the right medial lower leg and a large area of superficial ulceration on the posterior medial left lower leg. past medical history; chronic kidney disease stage III, gastroesophageal reflux disease, type 2 diabetes with neuropathy, chronic diastolic heart failure, obstructive sleep apnea, hypertension, restless leg syndrome, pulmonary venous hypertension, history of colon CA, pacemaker and apparently venous reflux. We did not do arterial studies in the clinic today we are going to try to get the results that were already done within the last 2 months in [place] 7/26; patient readmitted to the clinic last week with predominantly chronic venous wounds almost circumferentially in the left lower leg and the right leg medially. She also has secondary lymphedema. We put her in compression. She went to the ER in [place] on Friday they remove the wrap on the left leg diagnosed her with cellulitis and put her on doxycycline. They apparently also did a ultrasound that was negative for DVT . We did not get any vascular information from [facility] where she apparently had arterial  studies and venous studies. She is currently moving from [employer] to [place] is up on her feet quite a bit. She was referred to vein and vascular here but never got to the appointment. ABIs were obtained here at 0.89 on the right and 1.02 on the left Objective Constitutional Patient is hypertensive.. Pulse regular and within target range for patient.Marland Kitchen Respirations regular, non-labored and within target range.. Temperature is normal and within the target range for the patient.Marland Kitchen Appears in no distress. Vitals Time Taken: 4:05 PM, Height: 63 in, Weight: 220 lbs, BMI: 39, Temperature: 98.5 F, Pulse: 88 bpm, Respiratory Rate: 19 breaths/min, Blood Pressure: 155/74 mmHg, Capillary Blood Glucose: 123 mg/dl. General Notes: patient stated last CBG was 123 Cardiovascular Both dorsalis pedis and posterior tibial pulses palpable bilaterally. Nonpitting edema. Dermatosclerosis. General Notes: Wound exam ooThe patient has circumferential wounds from the medial to the lateral part of the left calf. Venous inflammation is marked especially on the left medial ankle. In general I think her erythema is better. She has improving epithelialization. ooThe area on the right medial lower leg looks somewhat better. Integumentary (Hair, Skin) Wound #1 status is Open. Original cause of wound was Gradually Appeared. The wound is located on the Left,Circumferential Lower Leg. The wound measures 18cm length x 25cm width x 0.1cm depth; 353.429cm^2 area and 35.343cm^3 volume. There is Fat Layer (Subcutaneous Tissue) Exposed exposed. There is no tunneling or undermining noted. There is a large amount of serous drainage noted. The wound margin is distinct with the outline attached to the wound base. There is medium (34-66%) red, pink granulation within the wound bed. There is a medium (34-66%) amount of necrotic tissue within the wound bed including Adherent Slough. Wound #2 status is Open. Original cause of wound was  Gradually Appeared. The wound is located on the Right,Medial Lower Leg. The wound measures 0.5cm length x 0.5cm width x 0.1cm depth; 0.196cm^2 area and 0.02cm^3 volume. There is Fat Layer (Subcutaneous Tissue) Exposed exposed. There is no tunneling or undermining noted. There is a medium amount of serosanguineous drainage noted. The wound margin is flat and intact. There is medium (34-66%) red granulation within the wound bed. There is a  medium (34-66%) amount of necrotic tissue within the wound bed including Adherent Slough. Assessment Active Problems ICD-10 Chronic venous hypertension (idiopathic) with ulcer and inflammation of bilateral lower extremity Type 2 diabetes mellitus with other skin ulcer Non-pressure chronic ulcer of left calf limited to breakdown of skin Non-pressure chronic ulcer of right calf with other specified severity Lymphedema, not elsewhere classified Procedures Wound #1 Pre-procedure diagnosis of Wound #1 is a Diabetic Wound/Ulcer of the Lower Extremity located on the Left,Circumferential Lower Leg . There was a Three Layer Compression Therapy Procedure by Levan Hurst, RN. Post procedure Diagnosis Wound #1: Same as Pre-Procedure Wound #2 Pre-procedure diagnosis of Wound #2 is a Diabetic Wound/Ulcer of the Lower Extremity located on the Right,Medial Lower Leg . There was a Three Layer Compression Therapy Procedure by Levan Hurst, RN. Post procedure Diagnosis Wound #2: Same as Pre-Procedure Plan Follow-up Appointments: Return Appointment in 2 weeks. - MD visit Nurse Visit: - 1 week Dressing Change Frequency: Wound #1 Left,Circumferential Lower Leg: Do not change entire dressing for one week. Wound #2 Right,Medial Lower Leg: Do not change entire dressing for one week. Skin Barriers/Peri-Wound Care: Moisturizing lotion - both legs TCA Cream or Ointment - mixed with lotion Wound Cleansing: May shower with protection. - use cast protector Primary Wound  Dressing: Wound #1 Left,Circumferential Lower Leg: Calcium Alginate with Silver Wound #2 Right,Medial Lower Leg: Calcium Alginate with Silver Secondary Dressing: Wound #1 Left,Circumferential Lower Leg: ABD pad Zetuvit or Kerramax Wound #2 Right,Medial Lower Leg: ABD pad Edema Control: 3 Layer Compression System - Bilateral Avoid standing for long periods of time Elevate legs to the level of the heart or above for 30 minutes daily and/or when sitting, a frequency of: - throughout the day Exercise regularly 1. I am doubtful she had cellulitis but I did not alter the doxycycline that was prescribed. Likely has significant stasis dermatitis this was present last week as well. 2. Continued with silver alginate as the primary dressing 3 layer compression bilaterally 3. I do not believe she has significant arterial insufficiency. Could probably increase to 4 layer compression if she could tolerate it 4. The patient apparently has had venous reflux studies although we have not been able to look at these. We may have to refer her back to vein and vascular in [place]. I have asked her to go to the hospital in [place] to see if she can get a copy of these records. Electronic Signature(s) Signed: 04/04/2020 6:00:25 PM By: Linton Ham MD Entered By: Linton Ham on 04/04/2020 17:29:11 -------------------------------------------------------------------------------- SuperBill Details Patient Name: Date of Service: KHADIJAH, MASTRIANNI 04/04/2020 Medical Record Number: [id number] Patient Account Number: [id number] Date of Birth/Sex: Treating RN: [birth year] (79 y.o. [name] Primary Care Provider: Margaretha Sheffield Other Clinician: Referring Provider: Treating Provider/Extender: Benedetto Coons in Treatment: 1 Diagnosis Coding ICD-10 Codes Code Description [phone] Chronic venous hypertension (idiopathic) with ulcer and inflammation of bilateral lower  extremity E11.622 Type 2 diabetes mellitus with other skin ulcer L97.221 Non-pressure chronic ulcer of left calf limited to breakdown of skin L97.218 Non-pressure chronic ulcer of right calf with other specified severity I89.0 Lymphedema, not elsewhere classified Facility Procedures CPT4: Code [id number] Description: 81 BILATERAL: Application of multi-layer venous compression system; leg (below knee), including ankle and t. Modifier: Quantity: 1 Physician Procedures : CPT4 Code Description Modifier [id number] - WC PHYS LEVEL 3 - EST PT ICD-10 Diagnosis Description L97.221 Non-pressure chronic ulcer of left calf limited to breakdown of skin  L97.218 Non-pressure chronic ulcer of right calf with other specified  severity I89.0 Lymphedema, not elsewhere classified Quantity: 1 Electronic Signature(s) Signed: 04/06/2020 3:52:22 AM By: Linton Ham MD Signed: 04/06/2020 6:13:34 PM By: Levan Hurst RN, BSN Previous Signature: 04/04/2020 6:00:25 PM Version By: Linton Ham MD Entered By: Levan Hurst on 04/04/2020 18:10:42

## 2020-04-11 ENCOUNTER — Encounter (HOSPITAL_BASED_OUTPATIENT_CLINIC_OR_DEPARTMENT_OTHER): Payer: Medicare PPO | Attending: Internal Medicine | Admitting: Internal Medicine

## 2020-04-11 DIAGNOSIS — I5032 Chronic diastolic (congestive) heart failure: Secondary | ICD-10-CM | POA: Insufficient documentation

## 2020-04-11 DIAGNOSIS — G4733 Obstructive sleep apnea (adult) (pediatric): Secondary | ICD-10-CM | POA: Insufficient documentation

## 2020-04-11 DIAGNOSIS — L97221 Non-pressure chronic ulcer of left calf limited to breakdown of skin: Secondary | ICD-10-CM | POA: Diagnosis not present

## 2020-04-11 DIAGNOSIS — E1140 Type 2 diabetes mellitus with diabetic neuropathy, unspecified: Secondary | ICD-10-CM | POA: Insufficient documentation

## 2020-04-11 DIAGNOSIS — I87333 Chronic venous hypertension (idiopathic) with ulcer and inflammation of bilateral lower extremity: Secondary | ICD-10-CM | POA: Diagnosis not present

## 2020-04-11 DIAGNOSIS — N183 Chronic kidney disease, stage 3 unspecified: Secondary | ICD-10-CM | POA: Insufficient documentation

## 2020-04-11 DIAGNOSIS — E11622 Type 2 diabetes mellitus with other skin ulcer: Secondary | ICD-10-CM | POA: Insufficient documentation

## 2020-04-11 DIAGNOSIS — I890 Lymphedema, not elsewhere classified: Secondary | ICD-10-CM | POA: Insufficient documentation

## 2020-04-11 DIAGNOSIS — I130 Hypertensive heart and chronic kidney disease with heart failure and stage 1 through stage 4 chronic kidney disease, or unspecified chronic kidney disease: Secondary | ICD-10-CM | POA: Insufficient documentation

## 2020-04-11 DIAGNOSIS — L97218 Non-pressure chronic ulcer of right calf with other specified severity: Secondary | ICD-10-CM | POA: Insufficient documentation

## 2020-04-11 DIAGNOSIS — Z85038 Personal history of other malignant neoplasm of large intestine: Secondary | ICD-10-CM | POA: Insufficient documentation

## 2020-04-11 DIAGNOSIS — I272 Pulmonary hypertension, unspecified: Secondary | ICD-10-CM | POA: Diagnosis not present

## 2020-04-11 DIAGNOSIS — E1122 Type 2 diabetes mellitus with diabetic chronic kidney disease: Secondary | ICD-10-CM | POA: Insufficient documentation

## 2020-04-11 DIAGNOSIS — Z950 Presence of cardiac pacemaker: Secondary | ICD-10-CM | POA: Insufficient documentation

## 2020-04-11 NOTE — Progress Notes (Signed)
FILOMENA, POKORNEY ([id number]) Visit Report for 04/11/2020 Arrival Information Details Patient Name: Date of Service: KELYSE, PASK 04/11/2020 11:30 A M Medical Record Number: [id number] Patient Account Number: [id number] Date of Birth/Sex: Treating RN: Nov 21, 1940 (79 y.o. Hollie Salk, Larene [place] Primary Care Ruth Tully: Margaretha Sheffield Other Clinician: Referring Blakely Maranan: Treating Aquil Duhe/Extender: Benedetto Coons in Treatment: 2 Visit Information History Since Last Visit Added or deleted any medications: No Patient Arrived: Gilford Rile Any new allergies or adverse reactions: No Arrival Time: 12:17 Had a fall or experienced change in No Accompanied By: daughter activities of daily living that may affect Transfer Assistance: None risk of falls: Patient Identification Verified: Yes Signs or symptoms of abuse/neglect since last visito No Secondary Verification Process Completed: Yes Hospitalized since last visit: No Patient Requires Transmission-Based Precautions: No Implantable device outside of the clinic excluding No cellular tissue based products placed in the center since last visit: Has Dressing in Place as Prescribed: Yes Has Compression in Place as Prescribed: Yes Pain Present Now: No Electronic Signature(s) Signed: 04/11/2020 12:26:20 PM By: Kela Millin Entered By: Kela Millin on 04/11/2020 12:19:39 -------------------------------------------------------------------------------- Compression Therapy Details Patient Name: Date of Service: GWENETTE, WELLONS 04/11/2020 11:30 A M Medical Record Number: [id number] Patient Account Number: [id number] Date of Birth/Sex: Treating RN: 01/05/1941 (79 y.o. Clearnce Sorrel Primary Care Noemi Bellissimo: Margaretha Sheffield Other Clinician: Referring Kensly Bowmer: Treating Chancy Claros/Extender: Benedetto Coons in Treatment: 2 Compression Therapy Performed for Wound Assessment: Wound #1  Left,Circumferential Lower Leg Performed By: Clinician Carlene Coria, RN Compression Type: Three Layer Electronic Signature(s) Signed: 04/11/2020 12:26:20 PM By: Kela Millin Entered By: Kela Millin on 04/11/2020 12:20:51 -------------------------------------------------------------------------------- Compression Therapy Details Patient Name: Date of Service: YENTL, VERGE 04/11/2020 11:30 A M Medical Record Number: [id number] Patient Account Number: [id number] Date of Birth/Sex: Treating RN: 1940/11/09 (79 y.o. Clearnce Sorrel Primary Care Margurete Guaman: Margaretha Sheffield Other Clinician: Referring Hillel Card: Treating Irine Heminger/Extender: Benedetto Coons in Treatment: 2 Compression Therapy Performed for Wound Assessment: Wound #2 Right,Medial Lower Leg Performed By: Clinician Carlene Coria, RN Compression Type: Three Layer Electronic Signature(s) Signed: 04/11/2020 12:26:20 PM By: Kela Millin Entered By: Kela Millin on 04/11/2020 12:20:51 -------------------------------------------------------------------------------- Encounter Discharge Information Details Patient Name: Date of Service: PRISMA, DECARLO 04/11/2020 11:30 A M Medical Record Number: [id number] Patient Account Number: [id number] Date of Birth/Sex: Treating RN: [birth year] (79 y.o. Clearnce Sorrel Primary Care Tequia Wolman: Margaretha Sheffield Other Clinician: Referring Kadarius Cuffe: Treating Everlene Cunning/Extender: Benedetto Coons in Treatment: 2 Encounter Discharge Information Items Discharge Condition: Stable Ambulatory Status: Walker Discharge Destination: Home Transportation: Private Auto Accompanied By: daughter Schedule Follow-up Appointment: Yes Clinical Summary of Care: Patient Declined Electronic Signature(s) Signed: 04/11/2020 12:26:20 PM By: Kela Millin Entered By: Kela Millin on 04/11/2020  12:22:23 -------------------------------------------------------------------------------- Patient/Caregiver Education Details Patient Name: Date of Service: [name] 8/2/2021andnbsp11:30 A M Medical Record Number: [id number] Patient Account Number: [id number] Date of Birth/Gender: Treating RN: 06-Mar-1941 (79 y.o. Clearnce Sorrel Primary Care Physician: Margaretha Sheffield Other Clinician: Referring Physician: Treating Physician/Extender: Benedetto Coons in Treatment: 2 Education Assessment Education Provided To: Patient Education Topics Provided Elevated Blood Sugar/ Impact on Healing: Handouts: Elevated Blood Sugars: How Do They Affect Wound Healing Methods: Explain/Verbal Responses: State content correctly Safety: Handouts: Safety Methods: Explain/Verbal Responses: State content correctly Venous: Handouts: Controlling Swelling with Multilayered Compression Wraps Methods: Explain/Verbal Responses: State content correctly Wound/Skin Impairment: Handouts: Caring for Your Ulcer Methods: Explain/Verbal Responses: State content correctly Electronic Signature(s) Signed: 04/11/2020 12:26:20 PM  By: Kela Millin Entered By: Kela Millin on 04/11/2020 12:22:11 -------------------------------------------------------------------------------- Wound Assessment Details Patient Name: Date of Service: LASHALA, LASER 04/11/2020 11:30 A M Medical Record Number: [id number] Patient Account Number: [id number] Date of Birth/Sex: Treating RN: 12-14-1940 (79 y.o. Clearnce Sorrel Primary Care Kayceon Oki: Margaretha Sheffield Other Clinician: Referring Bartolo Montanye: Treating Brynna Dobos/Extender: Benedetto Coons in Treatment: 2 Wound Status Wound Number: 1 Primary Diabetic Wound/Ulcer of the Lower Extremity Etiology: Wound Location: Left, Circumferential Lower Leg Wound Open Wounding Event: Gradually Appeared Status: Date  Acquired: 10/12/2019 Comorbid Congestive Heart Failure, Hypertension, Peripheral Venous Weeks Of Treatment: 2 History: Disease, Type II Diabetes Clustered Wound: Yes Wound Measurements Length: (cm) 18 Width: (cm) 25 Depth: (cm) 0.1 Area: (cm) 353.429 Volume: (cm) 35.343 % Reduction in Area: -57.3% % Reduction in Volume: -57.3% Epithelialization: None Tunneling: No Undermining: No Wound Description Classification: Grade 2 Wound Margin: Distinct, outline attached Exudate Amount: Large Exudate Type: Serous Exudate Color: amber Foul Odor After Cleansing: No Slough/Fibrino Yes Wound Bed Granulation Amount: Medium (34-66%) Exposed Structure Granulation Quality: Red, Pink Fascia Exposed: No Necrotic Amount: Medium (34-66%) Fat Layer (Subcutaneous Tissue) Exposed: Yes Necrotic Quality: Adherent Slough Tendon Exposed: No Muscle Exposed: No Joint Exposed: No Bone Exposed: No Treatment Notes Wound #1 (Left, Circumferential Lower Leg) 1. Cleanse With Wound Cleanser Soap and water 2. Periwound Care Moisturizing lotion TCA Cream 3. Primary Dressing Applied Calcium Alginate Ag 4. Secondary Dressing ABD Pad Kerramax/Xtrasorb 6. Support Layer Applied 3 layer compression wrap Notes zetuvit Electronic Signature(s) Signed: 04/11/2020 12:26:20 PM By: Kela Millin Entered By: Kela Millin on 04/11/2020 12:20:24 -------------------------------------------------------------------------------- Wound Assessment Details Patient Name: Date of Service: TZIPORA, MCINROY 04/11/2020 11:30 A M Medical Record Number: [id number] Patient Account Number: [id number] Date of Birth/Sex: Treating RN: 02/14/1941 (79 y.o. Clearnce Sorrel Primary Care Jian Hodgman: Margaretha Sheffield Other Clinician: Referring Arjuna Doeden: Treating Kelin Nixon/Extender: Benedetto Coons in Treatment: 2 Wound Status Wound Number: 2 Primary Diabetic Wound/Ulcer of the Lower  Extremity Etiology: Wound Location: Right, Medial Lower Leg Wound Open Wounding Event: Gradually Appeared Status: Date Acquired: 11/09/2019 Comorbid Congestive Heart Failure, Hypertension, Peripheral Venous Weeks Of Treatment: 2 History: Disease, Type II Diabetes Clustered Wound: No Wound Measurements Length: (cm) 0.5 Width: (cm) 0.5 Depth: (cm) 0.1 Area: (cm) 0.196 Volume: (cm) 0.02 % Reduction in Area: 99.6% % Reduction in Volume: 99.6% Epithelialization: None Tunneling: No Undermining: No Wound Description Classification: Grade 2 Wound Margin: Flat and Intact Exudate Amount: Medium Exudate Type: Serosanguineous Exudate Color: red, brown Foul Odor After Cleansing: No Slough/Fibrino Yes Wound Bed Granulation Amount: Medium (34-66%) Exposed Structure Granulation Quality: Red Fascia Exposed: No Necrotic Amount: Medium (34-66%) Fat Layer (Subcutaneous Tissue) Exposed: Yes Necrotic Quality: Adherent Slough Tendon Exposed: No Muscle Exposed: No Joint Exposed: No Bone Exposed: No Treatment Notes Wound #2 (Right, Medial Lower Leg) 1. Cleanse With Wound Cleanser Soap and water 2. Periwound Care Moisturizing lotion TCA Cream 3. Primary Dressing Applied Calcium Alginate Ag 4. Secondary Dressing ABD Pad Kerramax/Xtrasorb 6. Support Layer Applied 3 layer compression wrap Notes zetuvit Electronic Signature(s) Signed: 04/11/2020 12:26:20 PM By: Kela Millin Entered By: Kela Millin on 04/11/2020 12:20:31 -------------------------------------------------------------------------------- Vitals Details Patient Name: Date of Service: ARDIE, DRAGOO 04/11/2020 11:30 A M Medical Record Number: [id number] Patient Account Number: [id number] Date of Birth/Sex: Treating RN: 1940/09/28 (80 y.o. Clearnce Sorrel Primary Care Kisean Rollo: Margaretha Sheffield Other Clinician: Referring Wynn Kernes: Treating Dacian Orrico/Extender: Benedetto Coons in  Treatment: 2 Vital Signs Time Taken: 11:30 Temperature (  F): 98.1 Height (in): 63 Pulse (bpm): 81 Weight (lbs): 220 Respiratory Rate (breaths/min): 19 Body Mass Index (BMI): 39 Blood Pressure (mmHg): 161/84 Reference Range: 80 - 120 mg / dl Electronic Signature(s) Signed: 04/11/2020 12:26:20 PM By: Kela Millin Entered By: Kela Millin on 04/11/2020 12:20:03

## 2020-04-12 ENCOUNTER — Other Ambulatory Visit: Payer: Self-pay

## 2020-04-12 DIAGNOSIS — M7989 Other specified soft tissue disorders: Secondary | ICD-10-CM

## 2020-04-18 ENCOUNTER — Encounter (HOSPITAL_BASED_OUTPATIENT_CLINIC_OR_DEPARTMENT_OTHER): Payer: Medicare PPO | Admitting: Internal Medicine

## 2020-04-18 DIAGNOSIS — I87333 Chronic venous hypertension (idiopathic) with ulcer and inflammation of bilateral lower extremity: Secondary | ICD-10-CM | POA: Diagnosis not present

## 2020-04-18 NOTE — Progress Notes (Signed)
[name], [name] ([id number]) Visit Report for 04/18/2020 HPI Details Patient Name: Date of Service: [name], [name] 04/18/2020 1:30 PM Medical Record Number: [id number] Patient Account Number: [id number] Date of Birth/Sex: Treating RN: 07/20/1941 (79 y.o. [name] Primary Care Provider: Margaretha [name] Other Clinician: Referring Provider: Treating Provider/Extender: [name] in Treatment: 3 History of Present Illness HPI Description: ADMISSION 03/28/2020 This is a 79 year old woman who is accompanied by her daughter. She is moving to [place] from [place] where she lives. Infectious been going to the wound care center in [place] for the last 2 months. She apparently did not tolerate compression early on in that clinic stay. She is just using dry gauze over the wounds when she came in today. She tells me she has had both arterial and venous reflux studies although she does not have copies of these. She was apparently offered an ablation but I have no further information on that. She also is a type II diabetic. She was noted to have wounds on her lower extremities during a CHF clinic visit with Dr. Haroldine [name] and she was referred here. She has several scattered areas on the right medial lower leg and a large area of superficial ulceration on the posterior medial left lower leg. past medical history; chronic kidney disease stage III, gastroesophageal reflux disease, type 2 diabetes with neuropathy, chronic diastolic heart failure, obstructive sleep apnea, hypertension, restless leg syndrome, pulmonary venous hypertension, history of colon CA, pacemaker and apparently venous reflux. We did not do arterial studies in the clinic today we are going to try to get the results that were already done within the last 2 months in [place] 7/26; patient readmitted to the clinic last week with predominantly chronic venous wounds almost circumferentially in the  left lower leg and the right leg medially. She also has secondary lymphedema. We put her in compression. She went to the ER in [place] on Friday they remove the wrap on the left leg diagnosed her with cellulitis and put her on doxycycline. They apparently also did a ultrasound that was negative for DVT . We did not get any vascular information from [facility] where she apparently had arterial studies and venous studies. She is currently moving from [place] to [place] is up on her feet quite a bit. She was referred to vein and vascular here but never got to the appointment. ABIs were obtained here at 0.89 on the right and 1.02 on the left 8/9-Patient returns after establishing in clinic last visit, we have been doing 3 layer compression on both sides with calcium alginate, she is following up with the vein and vascular when she gets an appointment. The right side is healed the left side is still got some small open areas She has appt on Wed at Vein and vascular at which point she will come in for Nurse visit here Electronic Signature(s) Signed: 04/18/2020 2:12:26 PM By: [name], MBA Previous Signature: 04/18/2020 2:04:06 PM Version By: [name], MBA Entered By: [name] on 04/18/2020 14:12:25 -------------------------------------------------------------------------------- Physical Exam Details Patient Name: Date of Service: [name], [name] 04/18/2020 1:30 PM Medical Record Number: [id number] Patient Account Number: [id number] Date of Birth/Sex: Treating RN: 1941-06-28 (79 y.o. [name] Primary Care Provider: Margaretha [name] Other Clinician: Referring Provider: Treating Provider/Extender: [name] in Treatment: 3 Constitutional alert and oriented x 3. sitting or standing blood pressure is within target range for patient.. supine blood pressure is within target range  for patient.. pulse regular and within target range  for patient.Marland Kitchen respirations regular, non-labored and within target range for patient.Marland Kitchen temperature within target range for patient.. . . Well- nourished and well-hydrated in no acute distress. Notes Left medial leg areas very small tiny open areas with oozing, surrounding skin actually looks good Electronic Signature(s) Signed: 04/18/2020 2:04:39 PM By: [name], MBA Entered By: [name] on 04/18/2020 14:04:38 -------------------------------------------------------------------------------- Physician Orders Details Patient Name: Date of Service: [name] 04/18/2020 1:30 PM Medical Record Number: [id number] Patient Account Number: [id number] Date of Birth/Sex: Treating RN: 05-Oct-1940 (79 y.o. [name] Primary Care Provider: Margaretha [name] Other Clinician: Referring Provider: Treating Provider/Extender: [name] in Treatment: 3 Verbal / Phone Orders: No Diagnosis Coding ICD-10 Coding Code Description [phone] Chronic venous hypertension (idiopathic) with ulcer and inflammation of bilateral lower extremity E11.622 Type 2 diabetes mellitus with other skin ulcer L97.221 Non-pressure chronic ulcer of left calf limited to breakdown of skin L97.218 Non-pressure chronic ulcer of right calf with other specified severity I89.0 Lymphedema, not elsewhere classified Follow-up Appointments Return Appointment in 1 week. Dressing Change Frequency Wound #1 Left,Circumferential Lower Leg Do not change entire dressing for one week. Skin Barriers/Peri-Wound Care Moisturizing lotion - both legs TCA Cream or Ointment - mixed with lotion Wound Cleansing May shower with protection. - use cast protector Primary Wound Dressing Wound #1 Left,Circumferential Lower Leg Calcium Alginate with Silver Secondary Dressing Wound #1 Left,Circumferential Lower Leg ABD pad Edema Control 3 Layer Compression System - Bilateral Avoid standing for long  periods of time Elevate legs to the level of the heart or above for 30 minutes daily and/or when sitting, a frequency of: - throughout the day Exercise regularly Electronic Signature(s) Signed: 04/18/2020 4:24:36 PM By: [name], MBA Signed: 04/18/2020 5:19:36 PM By: Levan Hurst RN, BSN Entered By: Levan Hurst on 04/18/2020 14:04:10 -------------------------------------------------------------------------------- Problem List Details Patient Name: Date of Service: [name], [name] 04/18/2020 1:30 PM Medical Record Number: [id number] Patient Account Number: [id number] Date of Birth/Sex: Treating RN: 07-07-1941 (79 y.o. [name] Primary Care Provider: Margaretha [name] Other Clinician: Referring Provider: Treating Provider/Extender: [name] in Treatment: 3 Active Problems ICD-10 Encounter Code Description Active Date MDM Diagnosis I87.333 Chronic venous hypertension (idiopathic) with ulcer and inflammation of 03/28/2020 No Yes bilateral lower extremity E11.622 Type 2 diabetes mellitus with other skin ulcer 03/28/2020 No Yes L97.221 Non-pressure chronic ulcer of left calf limited to breakdown of skin 03/28/2020 No Yes L97.218 Non-pressure chronic ulcer of right calf with other specified severity 03/28/2020 No Yes I89.0 Lymphedema, not elsewhere classified 03/28/2020 No Yes Inactive Problems Resolved Problems Electronic Signature(s) Signed: 04/18/2020 4:24:36 PM By: [name], MBA Signed: 04/18/2020 5:19:36 PM By: Levan Hurst RN, BSN Entered By: Levan Hurst on 04/18/2020 14:01:45 -------------------------------------------------------------------------------- Progress Note Details Patient Name: Date of Service: [name], [name] 04/18/2020 1:30 PM Medical Record Number: [id number] Patient Account Number: [id number] Date of Birth/Sex: Treating RN: 21-May-1941 (79 y.o. [name] Primary Care Provider: Margaretha [name] Other Clinician: Referring Provider: Treating Provider/Extender: [name] in Treatment: 3 Subjective History of Present Illness (HPI) ADMISSION 03/28/2020 This is a 79 year old woman who is accompanied by her daughter. She is moving to [place] from [place] where she lives. Infectious been going to the wound care center in [place] for the last 2 months. She apparently did not tolerate compression early on in that clinic stay. She is just using dry gauze  over the wounds when she came in today. She tells me she has had both arterial and venous reflux studies although she does not have copies of these. She was apparently offered an ablation but I have no further information on that. She also is a type II diabetic. She was noted to have wounds on her lower extremities during a CHF clinic visit with Dr. Haroldine [name] and she was referred here. She has several scattered areas on the right medial lower leg and a large area of superficial ulceration on the posterior medial left lower leg. past medical history; chronic kidney disease stage III, gastroesophageal reflux disease, type 2 diabetes with neuropathy, chronic diastolic heart failure, obstructive sleep apnea, hypertension, restless leg syndrome, pulmonary venous hypertension, history of colon CA, pacemaker and apparently venous reflux. We did not do arterial studies in the clinic today we are going to try to get the results that were already done within the last 2 months in [place] 7/26; patient readmitted to the clinic last week with predominantly chronic venous wounds almost circumferentially in the left lower leg and the right leg medially. She also has secondary lymphedema. We put her in compression. She went to the ER in [place] on Friday they remove the wrap on the left leg diagnosed her with cellulitis and put her on doxycycline. They apparently also did a ultrasound that was negative for DVT  . We did not get any vascular information from [facility] where she apparently had arterial studies and venous studies. She is currently moving from [place] to [place] is up on her feet quite a bit. She was referred to vein and vascular here but never got to the appointment. ABIs were obtained here at 0.89 on the right and 1.02 on the left 8/9-Patient returns after establishing in clinic last visit, we have been doing 3 layer compression on both sides with calcium alginate, she is following up with the vein and vascular when she gets an appointment. The right side is healed the left side is still got some small open areas She has appt on Wed at Vein and vascular at which point she will come in for Nurse visit here Objective Constitutional alert and oriented x 3. sitting or standing blood pressure is within target range for patient.. supine blood pressure is within target range for patient.. pulse regular and within target range for patient.Marland Kitchen respirations regular, non-labored and within target range for patient.Marland Kitchen temperature within target range for patient.. Well- nourished and well-hydrated in no acute distress. Vitals Time Taken: 1:30 PM, Height: 63 in, Weight: 220 lbs, BMI: 39, Temperature: 98.6 F, Pulse: 83 bpm, Respiratory Rate: 18 breaths/min, Blood Pressure: 152/69 mmHg. General Notes: Left medial leg areas very small tiny open areas with oozing, surrounding skin actually looks good Integumentary (Hair, Skin) Wound #1 status is Open. Original cause of wound was Gradually Appeared. The wound is located on the Left,Circumferential Lower Leg. The wound measures 6.5cm length x 2.5cm width x 0.1cm depth; 12.763cm^2 area and 1.276cm^3 volume. There is Fat Layer (Subcutaneous Tissue) Exposed exposed. There is no tunneling or undermining noted. There is a small amount of serosanguineous drainage noted. The wound margin is distinct with the outline attached to the wound base. There is  medium (34-66%) red, pink granulation within the wound bed. There is a medium (34-66%) amount of necrotic tissue within the wound bed including Adherent Slough. Wound #2 status is Open. Original cause of wound was Gradually Appeared. The wound is located on the Right,Medial  Lower Leg. The wound measures 0cm length x 0cm width x 0cm depth; 0cm^2 area and 0cm^3 volume. There is no tunneling or undermining noted. There is a none present amount of drainage noted. The wound margin is flat and intact. There is no granulation within the wound bed. There is no necrotic tissue within the wound bed. Assessment Active Problems ICD-10 Chronic venous hypertension (idiopathic) with ulcer and inflammation of bilateral lower extremity Type 2 diabetes mellitus with other skin ulcer Non-pressure chronic ulcer of left calf limited to breakdown of skin Non-pressure chronic ulcer of right calf with other specified severity Lymphedema, not elsewhere classified Plan Follow-up Appointments: Return Appointment in 1 week. Dressing Change Frequency: Wound #1 Left,Circumferential Lower Leg: Do not change entire dressing for one week. Skin Barriers/Peri-Wound Care: Moisturizing lotion - both legs TCA Cream or Ointment - mixed with lotion Wound Cleansing: May shower with protection. - use cast protector Primary Wound Dressing: Wound #1 Left,Circumferential Lower Leg: Calcium Alginate with Silver Secondary Dressing: Wound #1 Left,Circumferential Lower Leg: ABD pad Edema Control: 3 Layer Compression System - Bilateral Avoid standing for long periods of time Elevate legs to the level of the heart or above for 30 minutes daily and/or when sitting, a frequency of: - throughout the day Exercise regularly -Continue silver alginate under 3 layer compression -Asked to keep legs elevated as much as possible -TCA to periwound -Return to clinic next week Electronic Signature(s) Signed: 04/18/2020 2:16:23 PM By: [name], MBA Previous Signature: 04/18/2020 2:05:34 PM Version By: [name], MBA Entered By: [name] on 04/18/2020 14:16:23 -------------------------------------------------------------------------------- SuperBill Details Patient Name: Date of Service: [name], [name] 04/18/2020 Medical Record Number: [id number] Patient Account Number: [id number] Date of Birth/Sex: Treating RN: 1941/02/25 (79 y.o. [name] Primary Care Provider: Margaretha [name] Other Clinician: Referring Provider: Treating Provider/Extender: [name] in Treatment: 3 Diagnosis Coding ICD-10 Codes Code Description [phone] Chronic venous hypertension (idiopathic) with ulcer and inflammation of bilateral lower extremity E11.622 Type 2 diabetes mellitus with other skin ulcer L97.221 Non-pressure chronic ulcer of left calf limited to breakdown of skin L97.218 Non-pressure chronic ulcer of right calf with other specified severity I89.0 Lymphedema, not elsewhere classified Facility Procedures CPT4: Code [id number] Description: 79 BILATERAL: Application of multi-layer venous compression system; leg (below knee), including ankle and t. Modifier: Quantity: 1 Physician Procedures : CPT4 Code Description Modifier [id number] - WC PHYS LEVEL 3 - EST PT ICD-10 Diagnosis Description I87.333 Chronic venous hypertension (idiopathic) with ulcer and inflammation of bilateral lower extremity Quantity: 1 Electronic Signature(s) Signed: 04/18/2020 4:24:36 PM By: [name], MBA Signed: 04/18/2020 5:16:06 PM By: Kela Millin Previous Signature: 04/18/2020 2:05:50 PM Version By: [name], MBA Entered By: Kela Millin on 04/18/2020 16:16:22

## 2020-04-19 NOTE — Progress Notes (Signed)
DAYLAN, BOGGESS ([id number]) Visit Report for 04/18/2020 Arrival Information Details Patient Name: Date of Service: ESSICA, KIKER 04/18/2020 1:30 PM Medical Record Number: [id number] Patient Account Number: [id number] Date of Birth/Sex: Treating RN: 1941-07-22 (79 y.o. [name] Primary Care Provider: Margaretha Sheffield Other Clinician: Referring Provider: Treating Provider/Extender: Levora Dredge in Treatment: 3 Visit Information History Since Last Visit All ordered tests and consults were completed: No Patient Arrived: Ambulatory Added or deleted any medications: No Arrival Time: 13:12 Any new allergies or adverse reactions: No Accompanied By: self Had a fall or experienced change in No Transfer Assistance: None activities of daily living that may affect Patient Identification Verified: Yes risk of falls: Secondary Verification Process Completed: Yes Signs or symptoms of abuse/neglect since last visito No Patient Requires Transmission-Based Precautions: No Hospitalized since last visit: No Implantable device outside of the clinic excluding No cellular tissue based products placed in the center since last visit: Has Dressing in Place as Prescribed: Yes Has Compression in Place as Prescribed: Yes Pain Present Now: No Electronic Signature(s) Signed: 04/19/2020 5:05:11 PM By: Carlene Coria RN Entered By: Carlene Coria on 04/18/2020 13:30:35 -------------------------------------------------------------------------------- Compression Therapy Details Patient Name: Date of Service: TINLEE, NAVARRETTE 04/18/2020 1:30 PM Medical Record Number: [id number] Patient Account Number: [id number] Date of Birth/Sex: Treating RN: 1940/10/12 (79 y.o. [name] Primary Care Provider: Margaretha Sheffield Other Clinician: Referring Provider: Treating Provider/Extender: Levora Dredge in Treatment: 3 Compression Therapy Performed for Wound  Assessment: Wound #1 Left,Circumferential Lower Leg Performed By: Clinician Levan Hurst, RN Compression Type: Three Layer Post Procedure Diagnosis Same as Pre-procedure Electronic Signature(s) Signed: 04/18/2020 5:19:36 PM By: Levan Hurst RN, BSN Entered By: Levan Hurst on 04/18/2020 14:05:21 -------------------------------------------------------------------------------- Encounter Discharge Information Details Patient Name: Date of Service: RIKO, LUMSDEN 04/18/2020 1:30 PM Medical Record Number: [id number] Patient Account Number: [id number] Date of Birth/Sex: Treating RN: 01-08-1941 (79 y.o. Elam Dutch Primary Care Provider: Margaretha Sheffield Other Clinician: Referring Provider: Treating Provider/Extender: Levora Dredge in Treatment: 3 Encounter Discharge Information Items Discharge Condition: Stable Ambulatory Status: Ambulatory Discharge Destination: Home Transportation: Private Auto Accompanied By: self Schedule Follow-up Appointment: Yes Clinical Summary of Care: Patient Declined Electronic Signature(s) Signed: 04/18/2020 4:51:12 PM By: Baruch Gouty RN, BSN Entered By: Baruch Gouty on 04/18/2020 14:30:07 -------------------------------------------------------------------------------- Lower Extremity Assessment Details Patient Name: Date of Service: CHERYLYN, SUNDBY 04/18/2020 1:30 PM Medical Record Number: [id number] Patient Account Number: [id number] Date of Birth/Sex: Treating RN: [birth year] (79 y.o. [name] Primary Care Provider: Margaretha Sheffield Other Clinician: Referring Provider: Treating Provider/Extender: Levora Dredge in Treatment: 3 Edema Assessment Assessed: Shirlyn Goltz: No] [Right: No] E[Left: dema] [Right: :] Calf Left: Right: Point of Measurement: 34 cm From Medial Instep 42 cm 39 cm Ankle Left: Right: Point of Measurement: 9 cm From Medial Instep 27 cm 27 cm Electronic  Signature(s) Signed: 04/19/2020 5:05:11 PM By: Carlene Coria RN Entered By: Carlene Coria on 04/18/2020 13:31:17 -------------------------------------------------------------------------------- [place] Details Patient Name: Date of Service: RYLLIE, NIELAND 04/18/2020 1:30 PM Medical Record Number: [id number] Patient Account Number: [id number] Date of Birth/Sex: Treating RN: [birth year] (79 y.o. Clearnce Sorrel Primary Care Provider: Margaretha Sheffield Other Clinician: Referring Provider: Treating Provider/Extender: Levora Dredge in Treatment: 3 Active Inactive Abuse / Safety / Falls / Self Care Management Nursing Diagnoses: Potential for falls Potential for injury related to falls Goals: Patient will not experience any injury related to falls Date Initiated: 03/28/2020  Target Resolution Date: 04/29/2020 Goal Status: Active Patient/caregiver will verbalize/demonstrate measures taken to prevent injury and/or falls Date Initiated: 03/28/2020 Target Resolution Date: 04/29/2020 Goal Status: Active Interventions: Assess Activities of Daily Living upon admission and as needed Assess fall risk on admission and as needed Assess: immobility, friction, shearing, incontinence upon admission and as needed Assess impairment of mobility on admission and as needed per policy Assess personal safety and home safety (as indicated) on admission and as needed Assess self care needs on admission and as needed Provide education on fall prevention Provide education on personal and home safety Notes: Nutrition Nursing Diagnoses: Imbalanced nutrition Potential for alteratiion in Nutrition/Potential for imbalanced nutrition Goals: Patient/caregiver agrees to and verbalizes understanding of need to use nutritional supplements and/or vitamins as prescribed Date Initiated: 03/28/2020 Target Resolution Date: 04/29/2020 Goal Status: Active Patient/caregiver  will maintain therapeutic glucose control Date Initiated: 03/28/2020 Target Resolution Date: 04/29/2020 Goal Status: Active Interventions: Assess HgA1c results as ordered upon admission and as needed Assess patient nutrition upon admission and as needed per policy Provide education on elevated blood sugars and impact on wound healing Provide education on nutrition Treatment Activities: Education provided on Nutrition : 03/28/2020 Notes: Venous Leg Ulcer Nursing Diagnoses: Knowledge deficit related to disease process and management Potential for venous Insuffiency (use before diagnosis confirmed) Goals: Patient will maintain optimal edema control Date Initiated: 03/28/2020 Target Resolution Date: 04/29/2020 Goal Status: Active Patient/caregiver will verbalize understanding of disease process and disease management Date Initiated: 03/28/2020 Target Resolution Date: 04/29/2020 Goal Status: Active Interventions: Assess peripheral edema status every visit. Compression as ordered Provide education on venous insufficiency Notes: Wound/Skin Impairment Nursing Diagnoses: Impaired tissue integrity Knowledge deficit related to ulceration/compromised skin integrity Goals: Patient/caregiver will verbalize understanding of skin care regimen Date Initiated: 03/28/2020 Target Resolution Date: 04/29/2020 Goal Status: Active Interventions: Assess patient/caregiver ability to obtain necessary supplies Assess patient/caregiver ability to perform ulcer/skin care regimen upon admission and as needed Assess ulceration(s) every visit Provide education on ulcer and skin care Notes: Electronic Signature(s) Signed: 04/18/2020 5:16:06 PM By: Kela Millin Entered By: Kela Millin on 04/18/2020 16:15:12 -------------------------------------------------------------------------------- Pain Assessment Details Patient Name: Date of Service: JADIA, CAPERS 04/18/2020 1:30 PM Medical Record Number:  [id number] Patient Account Number: [id number] Date of Birth/Sex: Treating RN: 1940-12-28 (79 y.o. [name] Primary Care Provider: Margaretha Sheffield Other Clinician: Referring Provider: Treating Provider/Extender: Levora Dredge in Treatment: 3 Active Problems Location of Pain Severity and Description of Pain Patient Has Paino No Site Locations Pain Management and Medication Current Pain Management: Electronic Signature(s) Signed: 04/19/2020 5:05:11 PM By: Carlene Coria RN Entered By: Carlene Coria on 04/18/2020 13:31:13 -------------------------------------------------------------------------------- Patient/Caregiver Education Details Patient Name: Date of Service: [name] 8/9/2021andnbsp1:30 PM Medical Record Number: [id number] Patient Account Number: [id number] Date of Birth/Gender: Treating RN: March 01, 1941 (79 y.o. Clearnce Sorrel Primary Care Physician: Margaretha Sheffield Other Clinician: Referring Physician: Treating Physician/Extender: Levora Dredge in Treatment: 3 Education Assessment Education Provided To: Patient Education Topics Provided Elevated Blood Sugar/ Impact on Healing: Handouts: Elevated Blood Sugars: How Do They Affect Wound Healing Methods: Explain/Verbal Responses: State content correctly Nutrition: Handouts: Elevated Blood Sugars: How Do They Affect Wound Healing Methods: Explain/Verbal Responses: State content correctly Safety: Handouts: Personal Safety Methods: Explain/Verbal Responses: State content correctly Venous: Responses: State content correctly Wound/Skin Impairment: Handouts: Caring for Your Ulcer Methods: Explain/Verbal Responses: State content correctly Electronic Signature(s) Signed: 04/18/2020 5:16:06 PM By: Kela Millin Entered By: Kela Millin on 04/18/2020  16:15:50 --------------------------------------------------------------------------------  Wound Assessment Details Patient Name: Date of Service: CLAUDIA, GREENLEY 04/18/2020 1:30 PM Medical Record Number: [id number] Patient Account Number: [id number] Date of Birth/Sex: Treating RN: Dec 06, 1940 (79 y.o. [name] Primary Care Provider: Margaretha Sheffield Other Clinician: Referring Provider: Treating Provider/Extender: Levora Dredge in Treatment: 3 Wound Status Wound Number: 1 Primary Diabetic Wound/Ulcer of the Lower Extremity Etiology: Wound Location: Left, Circumferential Lower Leg Wound Open Wounding Event: Gradually Appeared Status: Date Acquired: 10/12/2019 Comorbid Congestive Heart Failure, Hypertension, Peripheral Venous Weeks Of Treatment: 3 History: Disease, Type II Diabetes Clustered Wound: Yes Photos Photo Uploaded By: Mikeal Hawthorne on 04/19/2020 08:26:58 Wound Measurements Length: (cm) 6.5 Width: (cm) 2.5 Depth: (cm) 0.1 Area: (cm) 12.763 Volume: (cm) 1.276 % Reduction in Area: 94.3% % Reduction in Volume: 94.3% Epithelialization: None Tunneling: No Undermining: No Wound Description Classification: Grade 2 Wound Margin: Distinct, outline attached Exudate Amount: Small Exudate Type: Serosanguineous Exudate Color: red, brown Foul Odor After Cleansing: No Slough/Fibrino Yes Wound Bed Granulation Amount: Medium (34-66%) Exposed Structure Granulation Quality: Red, Pink Fascia Exposed: No Necrotic Amount: Medium (34-66%) Fat Layer (Subcutaneous Tissue) Exposed: Yes Necrotic Quality: Adherent Slough Tendon Exposed: No Muscle Exposed: No Joint Exposed: No Bone Exposed: No Treatment Notes Wound #1 (Left, Circumferential Lower Leg) 2. Periwound Care Moisturizing lotion TCA Cream 3. Primary Dressing Applied Calcium Alginate Ag 4. Secondary Dressing ABD Pad 6. Support Layer Applied 3 layer compression Orthoptist) Signed: 04/19/2020 5:05:11 PM By: Carlene Coria RN Entered By: Carlene Coria on 04/18/2020 13:31:50 -------------------------------------------------------------------------------- Wound Assessment Details Patient Name: Date of Service: CHENELLE, BENNING 04/18/2020 1:30 PM Medical Record Number: [id number] Patient Account Number: [id number] Date of Birth/Sex: Treating RN: July 13, 1941 (79 y.o. [name] Primary Care Provider: Margaretha Sheffield Other Clinician: Referring Provider: Treating Provider/Extender: Levora Dredge in Treatment: 3 Wound Status Wound Number: 2 Primary Diabetic Wound/Ulcer of the Lower Extremity Etiology: Wound Location: Right, Medial Lower Leg Wound Healed - Epithelialized Wounding Event: Gradually Appeared Status: Date Acquired: 11/09/2019 Comorbid Congestive Heart Failure, Hypertension, Peripheral Venous Weeks Of Treatment: 3 History: Disease, Type II Diabetes Clustered Wound: No Photos Wound Measurements Length: (cm) Width: (cm) Depth: (cm) Area: (cm) Volume: (cm) 0 % Reduction in Area: 100% 0 % Reduction in Volume: 100% 0 Epithelialization: Large (67-100%) 0 Tunneling: No 0 Undermining: No Wound Description Classification: Grade 2 Wound Margin: Flat and Intact Exudate Amount: None Present Foul Odor After Cleansing: No Slough/Fibrino No Wound Bed Granulation Amount: None Present (0%) Exposed Structure Necrotic Amount: None Present (0%) Fascia Exposed: No Fat Layer (Subcutaneous Tissue) Exposed: No Tendon Exposed: No Muscle Exposed: No Joint Exposed: No Bone Exposed: No Electronic Signature(s) Signed: 04/19/2020 5:05:11 PM By: Carlene Coria RN Signed: 04/19/2020 5:12:36 PM By: Mikeal Hawthorne EMT/HBOT/SD Entered By: Mikeal Hawthorne on 04/19/2020 08:27:47 -------------------------------------------------------------------------------- [place] Details Patient Name: Date of Service: KRYSTL, WICKWARE  04/18/2020 1:30 PM Medical Record Number: [id number] Patient Account Number: [id number] Date of Birth/Sex: Treating RN: Sep 07, 1941 (79 y.o. [name] Primary Care Provider: Margaretha Sheffield Other Clinician: Referring Provider: Treating Provider/Extender: Levora Dredge in Treatment: 3 Vital Signs Time Taken: 13:30 Temperature (F): 98.6 Height (in): 63 Pulse (bpm): 83 Weight (lbs): 220 Respiratory Rate (breaths/min): 18 Body Mass Index (BMI): 39 Blood Pressure (mmHg): 152/69 Reference Range: 80 - 120 mg / dl Electronic Signature(s) Signed: 04/19/2020 5:05:11 PM By: Carlene Coria RN Entered By: Carlene Coria on 04/18/2020 13:31:07

## 2020-04-20 ENCOUNTER — Other Ambulatory Visit: Payer: Self-pay

## 2020-04-20 ENCOUNTER — Ambulatory Visit (HOSPITAL_COMMUNITY)
Admission: RE | Admit: 2020-04-20 | Discharge: 2020-04-20 | Disposition: A | Discharge status: Home / Self Care | Payer: Medicare PPO | Source: Ambulatory Visit | Attending: Vascular Surgery | Admitting: Vascular Surgery

## 2020-04-20 ENCOUNTER — Ambulatory Visit (INDEPENDENT_AMBULATORY_CARE_PROVIDER_SITE_OTHER): Payer: Medicare PPO | Admitting: Vascular Surgery

## 2020-04-20 ENCOUNTER — Encounter: Payer: Self-pay | Admitting: Vascular Surgery

## 2020-04-20 VITALS — BP 112/49 | HR 72 | Temp 97.7°F | Resp 16 | Ht 63.0 in | Wt 210.0 lb

## 2020-04-20 DIAGNOSIS — M7989 Other specified soft tissue disorders: Secondary | ICD-10-CM | POA: Diagnosis not present

## 2020-04-20 NOTE — Progress Notes (Signed)
Patient name: [name] MRN: [id number] DOB: 1941-04-24 Sex: female  REASON FOR CONSULT: Leg swelling with ulcer  HPI: [name] is a 79 y.o. female, with about a 5-year history of chronic bilateral leg swelling.  Last 2 to 3 years it has been worse.  She does have a history of congestive failure.  She has been treated previously with diuretics.  She has tried some compression stockings in the past but did not really wear these frequently.  She had difficulty putting the stockings on.  This is the first episode she has had of skin breakdown.  She did have an arterial duplex scan done in [place] April 2021 which was normal.  She is currently being followed at the wound center and from her description receiving some type of compressive dressing.  She has no prior history of DVT.  She has difficulty walking due to chronic back pain.  She does not have claudication symptoms.  She also gets short of breath when she walks.  Other medical problems include arthritis, CKD 3, diabetes, neuropathy, hyperlipidemia, diastolic heart failure all of which have been stable.  Past Medical History:  Diagnosis Date  . Anxiety   . Arthritis   . Cancer ([place])    colon  . Chronic kidney disease    stage 3  . Chronic pain syndrome   . Diabetes ([place])   . Diabetic neuropathy ([place])   . Dyslipidemia   . Early cataracts, bilateral   . Fibromyalgia   . GERD (gastroesophageal reflux disease)   . H/O syncope   . Heart murmur   . Hypertension   . Lumbar spinal stenosis    and scoliosis  . Pneumonia   . PONV (postoperative nausea and vomiting)   . Presence of permanent cardiac pacemaker   . Restless legs   . Spinal headache   . Thyroid nodule    Past Surgical History:  Procedure Laterality Date  . APPENDECTOMY    . BREAST SURGERY     lumpectomy  . COLON SURGERY    . DILATION AND CURETTAGE OF UTERUS    . EP IMPLANTABLE DEVICE N/A 07/21/2015   Procedure: Pacemaker Implant;  Surgeon: Deboraha Sprang,  MD;  Location: [facility];  Service: Cardiovascular;  Laterality: N/A;  . LUMBAR LAMINECTOMY/DECOMPRESSION MICRODISCECTOMY N/A 02/02/2016   Procedure: DECOMPRESSION L4-L5 WITH INSITE 2 FUSION ;  Surgeon: Melina Schools, MD;  Location: [facility];  Service: Orthopedics;  Laterality: N/A;  . PARTIAL NEPHRECTOMY    . RIGHT/LEFT HEART CATH AND CORONARY ANGIOGRAPHY N/A 05/19/2018   Procedure: RIGHT/LEFT HEART CATH AND CORONARY ANGIOGRAPHY;  Surgeon: Jolaine Artist, MD;  Location: [facility];  Service: Cardiovascular;  Laterality: N/A;  . TONSILLECTOMY    . TUBAL LIGATION      Family History  Problem Relation Age of Onset  . CAD Other   . Sick sinus syndrome Brother        has a PPM    SOCIAL HISTORY: Social History   Socioeconomic History  . Marital status: Widowed    Spouse name: Not on file  . Number of children: Not on file  . Years of education: Not on file  . Highest education level: Not on file  Occupational History  . Not on file  Tobacco Use  . Smoking status: Former Smoker    Start date: 09/10/1953    Quit date: 09/11/1983    Years since quitting: 36.6  . Smokeless tobacco: Never Used  Vaping Use  . Vaping Use: Never used  Substance and Sexual Activity  . Alcohol use: Yes    Alcohol/week: 0.0 standard drinks    Comment: rare glass of wine  . Drug use: No  . Sexual activity: Not on file  Other Topics Concern  . Not on file  Social History Narrative  . Not on file   Social Determinants of Health   Financial Resource Strain:   . Difficulty of Paying Living Expenses:   Food Insecurity:   . Worried About Charity fundraiser in the Last Year:   . Arboriculturist in the Last Year:   Transportation Needs:   . Film/video editor (Medical):   Marland Kitchen Lack of Transportation (Non-Medical):   Physical Activity:   . Days of Exercise per Week:   . Minutes of Exercise per Session:   Stress:   . Feeling of Stress :   Social Connections:   . Frequency of  Communication with Friends and Family:   . Frequency of Social Gatherings with Friends and Family:   . Attends Religious Services:   . Active Member of Clubs or Organizations:   . Attends Archivist Meetings:   Marland Kitchen Marital Status:   Intimate Partner Violence:   . Fear of Current or Ex-Partner:   . Emotionally Abused:   Marland Kitchen Physically Abused:   . Sexually Abused:     Allergies  Allergen Reactions  . Penicillins Diarrhea and Other (See Comments)    Severe diarrhea Has patient had a PCN reaction causing immediate rash, facial/tongue/throat swelling, SOB or lightheadedness with hypotension: No Has patient had a PCN reaction causing severe rash involving mucus membranes or skin necrosis: No Has patient had a PCN reaction that required hospitalization No Has patient had a PCN reaction occurring within the last 10 years: No If all of the above answers are "NO", then may proceed with Cephalosporin use.   . Codeine Other (See Comments)    Unknown  . Adhesive [Tape] Rash  . Aspirin Other (See Comments)    Stomach burning  . Iodine Itching, Swelling, Rash and Other (See Comments)    Can use if Benadryl and Prednisone are used first   . Ivp Dye [Iodinated Diagnostic Agents] Other (See Comments)    Can use if Benadryl and Prednisone are used first    . Sulfa Antibiotics Diarrhea    Current Outpatient Medications  Medication Sig Dispense Refill  . ACCU-CHEK AVIVA PLUS test strip     . Accu-Chek Softclix Lancets lancets     . acetaminophen (TYLENOL) 650 MG CR tablet Take 1,300 mg by mouth every 8 (eight) hours.     . B-D UF III MINI PEN NEEDLES 31G X 5 MM MISC     . Blood Glucose Monitoring Suppl (ACCU-CHEK AVIVA PLUS) w/Device KIT     . Cholecalciferol (VITAMIN D) 2000 UNITS tablet Take 2,000 Units by mouth daily.    . cyclobenzaprine (FLEXERIL) 10 MG tablet Take 10 mg by mouth 3 (three) times daily as needed for muscle spasms.    Marland Kitchen doxylamine, Sleep, (UNISOM) 25 MG tablet Take  25 mg by mouth at bedtime.    . DULoxetine (CYMBALTA) 20 MG capsule Take 20 mg by mouth daily.    . empagliflozin (JARDIANCE) 10 MG TABS tablet Take 1 tablet (10 mg total) by mouth daily before breakfast. 90 tablet 3  . glucose blood (ACCU-CHEK AVIVA PLUS) test strip TK TO TEST BLOOD SUGAR QD    .  glucose blood (ACCU-CHEK AVIVA PLUS) test strip daily    . hydrALAZINE (APRESOLINE) 100 MG tablet Take 1 tablet (100 mg total) by mouth 3 (three) times daily. 90 tablet 6  . insulin lispro (HUMALOG KWIKPEN) 100 UNIT/ML KwikPen Humalog KwikPen (U-100) Insulin 100 unit/mL subcutaneous  Inject 10 units eveyday before largest meal    . irbesartan (AVAPRO) 300 MG tablet Take 300 mg by mouth daily.  3  . labetalol (NORMODYNE) 200 MG tablet TAKE 1 TABLET(200 MG) BY MOUTH TWICE DAILY 180 tablet 3  . omeprazole (PRILOSEC OTC) 20 MG tablet Take 20 mg by mouth daily as needed.     . pramipexole (MIRAPEX) 0.125 MG tablet Take 0.25 mg by mouth 2 (two) times daily.    . rosuvastatin (CRESTOR) 10 MG tablet Take 10 mg by mouth daily.     Marland Kitchen torsemide (DEMADEX) 20 MG tablet Take 2 tablets (40 mg total) by mouth daily. Take extra at 2pm if weight is 216 or greater 225 tablet 3  . traMADol (ULTRAM) 50 MG tablet Take 50 mg by mouth 3 (three) times daily as needed for moderate pain.      No current facility-administered medications for this visit.    ROS:   General:  No weight loss, Fever, chills  HEENT: No recent headaches, no nasal bleeding, no visual changes, no sore throat  Neurologic: No dizziness, blackouts, seizures. No recent symptoms of stroke or mini- stroke. No recent episodes of slurred speech, or temporary blindness.  Cardiac: No recent episodes of chest pain/pressure, no shortness of breath at rest.  No shortness of breath with exertion.  Denies history of atrial fibrillation or irregular heartbeat  Vascular: No history of rest pain in feet.  No history of claudication.  No history of non-healing  ulcer, No history of DVT   Pulmonary: No home oxygen, no productive cough, no hemoptysis,  No asthma or wheezing  Musculoskeletal:  [X]  Arthritis, [X]  Low back pain,  [X]  Joint pain  Hematologic:No history of hypercoagulable state.  No history of easy bleeding.  No history of anemia  Gastrointestinal: No hematochezia or melena,  No gastroesophageal reflux, no trouble swallowing  Urinary: [X]  chronic Kidney disease, [ ]  on HD - [ ]  MWF or [ ]  TTHS, [ ]  Burning with urination, [ ]  Frequent urination, [ ]  Difficulty urinating;   Skin: No rashes  Psychological: No history of anxiety,  No history of depression   Physical Examination  Vitals:   04/20/20 1528  BP: (!) 112/49  Pulse: 72  Resp: 16  Temp: 97.7 F (36.5 C)  TempSrc: Temporal  SpO2: 95%  Weight: 210 lb (95.3 kg)  Height: 5' 3"  (1.6 m)    Body mass index is 37.2 kg/m.  General:  Alert and oriented, no acute distress HEENT: Normal Neck: No JVD Cardiac: Regular Rate and Rhythm Abdomen: obese Skin: No rash, erythema over the posterior aspect of the left calf with hemosiderin staining of the gaiter area bilaterally, currently no open ulceration Extremity Pulses:  2+ dorsalis pedis pulses bilaterally Musculoskeletal: No deformity 1+ bilateral edema extending from the knee down to the ankle Neurologic: Upper and lower extremity motor 5/5 and symmetric  DATA:  Patient had a venous reflux exam today which I reviewed and interpreted. On the right leg the greater saphenous vein was about 5 mm in diameter but no evidence of reflux. She did have some reflux in the lesser saphenous vein but vein diameter was only 2 mm. She also had  some deep vein reflux in the common femoral vein. In the left lower extremity she did have reflux in the distal greater saphenous vein at the knee level vein diameter was 5 to 6 mm. She also had some common femoral vein reflux on the side.  ASSESSMENT: Patient does have a dilated greater saphenous  vein however she has minimal to no evidence of reflux in her superficial venous system.  She also has mild deep vein reflux.  She does not have an exam consistent with lymphedema.  She probably has multifactorial leg swelling with some component being cardiac related as well as some element of venous hypertension probably secondary to her obesity.  Without evidence of significant reflux I do not believe a laser ablation would be of significant benefit to her.  PLAN: Lower extremity compression stockings with intermittent Unna boots if necessary and if patient can tolerate for wound care. Otherwise some sort of compressive wrap with either a 3 layer dressing or Ace wraps.  Patient was counseled in weight loss.  May be difficult for her due to her age as well as inability to exercise.  She was also told to elevate her legs when she is not in compression.  If she develops recurrent ulceration we could consider revisiting the venous reflux exam to see if she has developed worsening reflux for consideration of laser ablation.  Otherwise continued management with compression therapy alone.  She will follow up with [place] on an as-needed basis.  Ruta Hinds, MD [facility] Office: [phone]

## 2020-04-21 ENCOUNTER — Encounter (HOSPITAL_BASED_OUTPATIENT_CLINIC_OR_DEPARTMENT_OTHER): Payer: Medicare PPO | Admitting: Internal Medicine

## 2020-04-22 ENCOUNTER — Encounter (HOSPITAL_BASED_OUTPATIENT_CLINIC_OR_DEPARTMENT_OTHER): Payer: Medicare PPO | Admitting: Internal Medicine

## 2020-04-22 DIAGNOSIS — I87333 Chronic venous hypertension (idiopathic) with ulcer and inflammation of bilateral lower extremity: Secondary | ICD-10-CM | POA: Diagnosis not present

## 2020-04-22 NOTE — Progress Notes (Signed)
GIRL, SCHISSLER ([id number]) Visit Report for 04/22/2020 Arrival Information Details Patient Name: Date of Service: [name], [name] 04/22/2020 2:45 PM Medical Record Number: [id number] Patient Account Number: [id number] Date of Birth/Sex: Treating RN: [birth year] (79 y.o. Elam Dutch Primary Care Anuhea Gassner: Margaretha Sheffield Other Clinician: Referring [name]: Treating Jaxston Chohan/Extender: Levora Dredge in Treatment: 3 Visit Information History Since Last Visit All ordered tests and consults were completed: Yes Patient Arrived: Ambulatory Added or deleted any medications: No Arrival Time: 14:49 Any new allergies or adverse reactions: No Accompanied By: self Had a fall or experienced change in No Transfer Assistance: None activities of daily living that may affect Patient Identification Verified: Yes risk of falls: Secondary Verification Process Completed: Yes Signs or symptoms of abuse/neglect since last visito No Patient Requires Transmission-Based Precautions: No Hospitalized since last visit: No Implantable device outside of the clinic excluding No cellular tissue based products placed in the center since last visit: Has Dressing in Place as Prescribed: No Has Compression in Place as Prescribed: No Pain Present Now: No Notes had wraps placed by vein and vascular Electronic Signature(s) Signed: 04/22/2020 5:26:58 PM By: Baruch Gouty RN, BSN Entered By: Baruch Gouty on 04/22/2020 14:55:08 -------------------------------------------------------------------------------- Compression Therapy Details Patient Name: Date of Service: [name], [name] 04/22/2020 2:45 PM Medical Record Number: [id number] Patient Account Number: [id number] Date of Birth/Sex: Treating RN: Feb 22, 1941 (79 y.o. Elam Dutch Primary Care Sonika Levins: Margaretha Sheffield Other Clinician: Referring [name]: Treating [name]/Extender: Levora Dredge in Treatment: 3 Compression Therapy Performed for Wound Assessment: Wound #1 Left,Circumferential Lower Leg Performed By: Clinician Baruch Gouty, RN Compression Type: Three Hydrologist) Signed: 04/22/2020 5:26:58 PM By: Baruch Gouty RN, BSN Entered By: Baruch Gouty on 04/22/2020 15:16:35 -------------------------------------------------------------------------------- Compression Therapy Details Patient Name: Date of Service: [name], [name] 04/22/2020 2:45 PM Medical Record Number: [id number] Patient Account Number: [id number] Date of Birth/Sex: Treating RN: [birth year] (79 y.o. Elam Dutch Primary Care Dura Mccormack: Margaretha Sheffield Other Clinician: Referring [name]: Treating Clell Trahan/Extender: Levora Dredge in Treatment: 3 Compression Therapy Performed for Wound Assessment: Non-Wound Location Performed By: Clinician Baruch Gouty, RN Compression Type: Three Layer Location: Lower Extremity, Right Electronic Signature(s) Signed: 04/22/2020 5:26:58 PM By: Baruch Gouty RN, BSN Entered By: Baruch Gouty on 04/22/2020 15:16:50 -------------------------------------------------------------------------------- Encounter Discharge Information Details Patient Name: Date of Service: [name], [name] 04/22/2020 2:45 PM Medical Record Number: [id number] Patient Account Number: [id number] Date of Birth/Sex: Treating RN: July 27, 1941 (79 y.o. Elam Dutch Primary Care Augustine Leverette: Margaretha Sheffield Other Clinician: Referring [name]: Treating Zaida Reiland/Extender: Levora Dredge in Treatment: 3 Encounter Discharge Information Items Discharge Condition: Stable Ambulatory Status: Ambulatory Discharge Destination: Home Transportation: Private Auto Accompanied By: self Schedule Follow-up Appointment: Yes Clinical Summary of Care: Patient Declined Electronic Signature(s) Signed: 04/22/2020  5:26:58 PM By: Baruch Gouty RN, BSN Entered By: Baruch Gouty on 04/22/2020 15:18:07 -------------------------------------------------------------------------------- Patient/Caregiver Education Details Patient Name: Date of Service: [name] 8/13/2021andnbsp2:45 PM Medical Record Number: [id number] Patient Account Number: [id number] Date of Birth/Gender: Treating RN: [birth year] (79 y.o. Elam Dutch Primary Care Physician: Margaretha Sheffield Other Clinician: Referring Physician: Treating Physician/Extender: Levora Dredge in Treatment: 3 Education Assessment Education Provided To: Patient Education Topics Provided Venous: Methods: Explain/Verbal Responses: Reinforcements needed, State content correctly Wound/Skin Impairment: Methods: Explain/Verbal Responses: Reinforcements needed, State content correctly Electronic Signature(s) Signed: 04/22/2020 5:26:58 PM By: Baruch Gouty RN, BSN Entered By: Baruch Gouty on 04/22/2020 15:17:52 -------------------------------------------------------------------------------- Wound Assessment Details Patient Name: Date  of Service: [name], [name] 04/22/2020 2:45 PM Medical Record Number: [id number] Patient Account Number: [id number] Date of Birth/Sex: Treating RN: 10-28-1940 (79 y.o. Elam Dutch Primary Care [name]: Margaretha Sheffield Other Clinician: Referring [name]: Treating Carianna Lague/Extender: Levora Dredge in Treatment: 3 Wound Status Wound Number: 1 Primary Diabetic Wound/Ulcer of the Lower Extremity Etiology: Wound Location: Left, Circumferential Lower Leg Wound Open Wounding Event: Gradually Appeared Status: Date Acquired: 10/12/2019 Comorbid Congestive Heart Failure, Hypertension, Peripheral Venous Weeks Of Treatment: 3 History: Disease, Type II Diabetes Clustered Wound: Yes Wound Measurements Length: (cm) 0.1 Width: (cm) 0.1 Depth: (cm)  0.1 Area: (cm) 0.008 Volume: (cm) 0.001 % Reduction in Area: 100% % Reduction in Volume: 100% Epithelialization: Large (67-100%) Tunneling: No Undermining: No Wound Description Classification: Grade 2 Wound Margin: Distinct, outline attached Exudate Amount: Small Exudate Type: Serous Exudate Color: amber Foul Odor After Cleansing: No Slough/Fibrino No Wound Bed Granulation Amount: Small (1-33%) Exposed Structure Granulation Quality: Pink Fascia Exposed: No Necrotic Amount: None Present (0%) Fat Layer (Subcutaneous Tissue) Exposed: No Tendon Exposed: No Muscle Exposed: No Joint Exposed: No Bone Exposed: No Limited to Skin Breakdown Treatment Notes Wound #1 (Left, Circumferential Lower Leg) 2. Periwound Care Moisturizing lotion TCA Cream 3. Primary Dressing Applied Calcium Alginate Ag 4. Secondary Dressing ABD Pad 6. Support Layer Applied 3 layer compression wrap Notes bil 3 layer wraps Electronic Signature(s) Signed: 04/22/2020 5:26:58 PM By: Baruch Gouty RN, BSN Entered By: Baruch Gouty on 04/22/2020 15:16:17 -------------------------------------------------------------------------------- Point Place Details Patient Name: Date of Service: [name], [name] 04/22/2020 2:45 PM Medical Record Number: [id number] Patient Account Number: [id number] Date of Birth/Sex: Treating RN: 19-Oct-1940 (79 y.o. Elam Dutch Primary Care Ankit Degregorio: Margaretha Sheffield Other Clinician: Referring Kimberli Winne: Treating Ifrah Vest/Extender: Levora Dredge in Treatment: 3 Vital Signs Time Taken: 14:55 Temperature (F): 98.7 Height (in): 63 Pulse (bpm): 77 Source: Stated Respiratory Rate (breaths/min): 18 Weight (lbs): 220 Blood Pressure (mmHg): 112/60 Source: Stated Capillary Blood Glucose (mg/dl): 89 Body Mass Index (BMI): 39 Reference Range: 80 - 120 mg / dl Notes glucose per pt report this am Electronic Signature(s) Signed: 04/22/2020 5:26:58 PM  By: Baruch Gouty RN, BSN Entered By: Baruch Gouty on 04/22/2020 14:56:10

## 2020-04-22 NOTE — Progress Notes (Signed)
RUTHIA, PERSON ([id number]) Visit Report for 04/22/2020 SuperBill Details Patient Name: Date of Service: [name], [name] 04/22/2020 Medical Record Number: [id number] Patient Account Number: [id number] Date of Birth/Sex: Treating RN: July 24, 1941 (78 y.o. [name] Primary Care Provider: Margaretha Sheffield Other Clinician: Referring Provider: Treating Provider/Extender: Levora Dredge in Treatment: 3 Diagnosis Coding ICD-10 Codes Code Description [phone] Chronic venous hypertension (idiopathic) with ulcer and inflammation of bilateral lower extremity E11.622 Type 2 diabetes mellitus with other skin ulcer L97.221 Non-pressure chronic ulcer of left calf limited to breakdown of skin L97.218 Non-pressure chronic ulcer of right calf with other specified severity I89.0 Lymphedema, not elsewhere classified Facility Procedures CPT4 Description Modifier Quantity Code [id number] BILATERAL: Application of multi-layer venous compression system; leg (below knee), including ankle and 1 foot. Electronic Signature(s) Signed: 04/22/2020 4:53:36 PM By: Tobi Bastos MD, MBA Signed: 04/22/2020 5:26:58 PM By: Baruch Gouty RN, BSN Entered By: Baruch Gouty on 04/22/2020 15:18:18

## 2020-04-25 ENCOUNTER — Encounter (HOSPITAL_BASED_OUTPATIENT_CLINIC_OR_DEPARTMENT_OTHER): Payer: Medicare PPO | Admitting: Internal Medicine

## 2020-04-25 DIAGNOSIS — I87333 Chronic venous hypertension (idiopathic) with ulcer and inflammation of bilateral lower extremity: Secondary | ICD-10-CM | POA: Diagnosis not present

## 2020-04-25 NOTE — Progress Notes (Signed)
[name], [name] ([id number]) Visit Report for 04/25/2020 HPI Details Patient Name: Date of Service: [name], [name] 04/25/2020 1:30 PM Medical Record Number: [id number] Patient Account Number: [id number] Date of Birth/Sex: Treating RN: 12-14-1940 (79 y.o. [name] Primary Care Provider: Margaretha Sheffield Other Clinician: Referring Provider: Treating Provider/Extender: Benedetto Coons in Treatment: 4 History of Present Illness HPI Description: ADMISSION 03/28/2020 This is a 79 year old woman who is accompanied by her daughter. She is moving to [place] from [place] where she lives. Infectious been going to the wound care center in [place] for the last 2 months. She apparently did not tolerate compression early on in that clinic stay. She is just using dry gauze over the wounds when she came in today. She tells me she has had both arterial and venous reflux studies although she does not have copies of these. She was apparently offered an ablation but I have no further information on that. She also is a type II diabetic. She was noted to have wounds on her lower extremities during a CHF clinic visit with Dr. Haroldine Laws and she was referred here. She has several scattered areas on the right medial lower leg and a large area of superficial ulceration on the posterior medial left lower leg. past medical history; chronic kidney disease stage III, gastroesophageal reflux disease, type 2 diabetes with neuropathy, chronic diastolic heart failure, obstructive sleep apnea, hypertension, restless leg syndrome, pulmonary venous hypertension, history of colon CA, pacemaker and apparently venous reflux. We did not do arterial studies in the clinic today we are going to try to get the results that were already done within the last 2 months in [place] 7/26; patient readmitted to the clinic last week with predominantly chronic venous wounds almost circumferentially in  the left lower leg and the right leg medially. She also has secondary lymphedema. We put her in compression. She went to the ER in [place] on Friday they remove the wrap on the left leg diagnosed her with cellulitis and put her on doxycycline. They apparently also did a ultrasound that was negative for DVT . We did not get any vascular information from [facility] where she apparently had arterial studies and venous studies. She is currently moving from [place] to [place] is up on her feet quite a bit. She was referred to vein and vascular here but never got to the appointment. ABIs were obtained here at 0.89 on the right and 1.02 on the left 8/9-Patient returns after establishing in clinic last visit, we have been doing 3 layer compression on both sides with calcium alginate, she is following up with the vein and vascular when she gets an appointment. The right side is healed the left side is still got some small open areas She has appt on Wed at Vein and vascular at which point she will come in for Nurse visit here 8/16; the patient arrives with all of her wounds closed. She has bilateral Juzo stockings. She saw Dr. Oneida Alar on 8/11 to review her reflux studies from the same day. On the right she had no evidence of DVT or SVT She did have deep venous reflux in the common femoral vein as well as superficial venous reflux in the . small saphenous vein of the mid calf on the left again no thrombus in the deep vein or superficial veins. Deep vein reflux in the common femoral vein as well as the great saphenous vein. She saw Dr. Oneida Alar in consult. Also of note he  noted that she had a normal arterial scan done in [place] in April 2021, we were never able to obtain this. His feeling was that the patient did have a dilated greater saphenous vein however minimal to no evidence of reflux in her superficial venous system. She also was felt to have mild deep vein reflux and some degree of lymphedema.  He recommended continued compression of the lower extremities with intermittent Unna boots if necessary. She was told to elevate her legs. If she develops recurrent ulcerations they were not opposed to revisiting the repeat venous reflux exam to see if she develops worsening reflux for consideration of laser ablation Electronic Signature(s) Signed: 04/25/2020 5:53:04 PM By: Linton Ham MD Entered By: Linton Ham on 04/25/2020 15:42:58 -------------------------------------------------------------------------------- Physical Exam Details Patient Name: Date of Service: [name], [name] 04/25/2020 1:30 PM Medical Record Number: [id number] Patient Account Number: [id number] Date of Birth/Sex: Treating RN: May 30, 1941 (79 y.o. [name] Primary Care Provider: Margaretha Sheffield Other Clinician: Referring Provider: Treating Provider/Extender: Benedetto Coons in Treatment: 4 Constitutional Patient is hypertensive.. Pulse regular and within target range for patient.Marland Kitchen Respirations regular, non-labored and within target range.. Temperature is normal and within the target range for the patient.Marland Kitchen Appears in no distress. Cardiovascular Pedal pulses are palpable. Notes Wound exam; the area on the left medial lower leg is closed as is the right. She has changes of chronic venous insufficiency with very adherent skin in the distal lower extremities bilaterally. Some degree of lymphedema. Electronic Signature(s) Signed: 04/25/2020 5:53:04 PM By: Linton Ham MD Entered By: Linton Ham on 04/25/2020 15:44:00 -------------------------------------------------------------------------------- Physician Orders Details Patient Name: Date of Service: [name], [name] 04/25/2020 1:30 PM Medical Record Number: [id number] Patient Account Number: [id number] Date of Birth/Sex: Treating RN: 01/10/1941 (79 y.o. [name] Primary Care Provider: Margaretha Sheffield Other  Clinician: Referring Provider: Treating Provider/Extender: Benedetto Coons in Treatment: 4 Verbal / Phone Orders: No Diagnosis Coding ICD-10 Coding Code Description [phone] Chronic venous hypertension (idiopathic) with ulcer and inflammation of bilateral lower extremity E11.622 Type 2 diabetes mellitus with other skin ulcer L97.221 Non-pressure chronic ulcer of left calf limited to breakdown of skin L97.218 Non-pressure chronic ulcer of right calf with other specified severity I89.0 Lymphedema, not elsewhere classified Discharge From [facility] Services Discharge from [place] Skin Barriers/Peri-Wound Care Moisturizing lotion - both legs daily Edema Control Avoid standing for long periods of time Elevate legs to the level of the heart or above for 30 minutes daily and/or when sitting, a frequency of: - throughout the day Exercise regularly Support Garment 30-40 mm/Hg pressure to: - Juzo compression garment to both legs daily. Apply first thing in the morning, remove at bedtime. Electronic Signature(s) Signed: 04/25/2020 5:14:32 PM By: Levan Hurst RN, BSN Signed: 04/25/2020 5:53:04 PM By: Linton Ham MD Entered By: Levan Hurst on 04/25/2020 15:12:31 -------------------------------------------------------------------------------- Problem List Details Patient Name: Date of Service: MEHAK, ROSKELLEY 04/25/2020 1:30 PM Medical Record Number: [id number] Patient Account Number: [id number] Date of Birth/Sex: Treating RN: [birth year] (79 y.o. [name] Primary Care Provider: Other Clinician: Margaretha Sheffield Referring Provider: Treating Provider/Extender: Benedetto Coons in Treatment: 4 Active Problems ICD-10 Encounter Code Description Active Date MDM Diagnosis I87.333 Chronic venous hypertension (idiopathic) with ulcer and inflammation of 03/28/2020 No Yes bilateral lower extremity E11.622 Type 2 diabetes mellitus  with other skin ulcer 03/28/2020 No Yes L97.221 Non-pressure chronic ulcer of left calf limited to breakdown of skin 03/28/2020 No Yes L97.218  Non-pressure chronic ulcer of right calf with other specified severity 03/28/2020 No Yes I89.0 Lymphedema, not elsewhere classified 03/28/2020 No Yes Inactive Problems Resolved Problems Electronic Signature(s) Signed: 04/25/2020 5:53:04 PM By: Linton Ham MD Entered By: Linton Ham on 04/25/2020 15:39:15 -------------------------------------------------------------------------------- Progress Note Details Patient Name: Date of Service: TARNISHA, KACHMAR 04/25/2020 1:30 PM Medical Record Number: [id number] Patient Account Number: [id number] Date of Birth/Sex: Treating RN: June 03, 1941 (79 y.o. [name] Primary Care Provider: Margaretha Sheffield Other Clinician: Referring Provider: Treating Provider/Extender: Benedetto Coons in Treatment: 4 Subjective History of Present Illness (HPI) ADMISSION 03/28/2020 This is a 79 year old woman who is accompanied by her daughter. She is moving to [place] from [place] where she lives. Infectious been going to the wound care center in [place] for the last 2 months. She apparently did not tolerate compression early on in that clinic stay. She is just using dry gauze over the wounds when she came in today. She tells me she has had both arterial and venous reflux studies although she does not have copies of these. She was apparently offered an ablation but I have no further information on that. She also is a type II diabetic. She was noted to have wounds on her lower extremities during a CHF clinic visit with Dr. Haroldine Laws and she was referred here. She has several scattered areas on the right medial lower leg and a large area of superficial ulceration on the posterior medial left lower leg. past medical history; chronic kidney disease stage III, gastroesophageal reflux  disease, type 2 diabetes with neuropathy, chronic diastolic heart failure, obstructive sleep apnea, hypertension, restless leg syndrome, pulmonary venous hypertension, history of colon CA, pacemaker and apparently venous reflux. We did not do arterial studies in the clinic today we are going to try to get the results that were already done within the last 2 months in [place] 7/26; patient readmitted to the clinic last week with predominantly chronic venous wounds almost circumferentially in the left lower leg and the right leg medially. She also has secondary lymphedema. We put her in compression. She went to the ER in [place] on Friday they remove the wrap on the left leg diagnosed her with cellulitis and put her on doxycycline. They apparently also did a ultrasound that was negative for DVT. We did not get any vascular information from [facility] where she apparently had arterial studies and venous studies. She is currently moving from [place] to [place] is up on her feet quite a bit. She was referred to vein and vascular here but never got to the appointment. ABIs were obtained here at 0.89 on the right and 1.02 on the left 8/9-Patient returns after establishing in clinic last visit, we have been doing 3 layer compression on both sides with calcium alginate, she is following up with the vein and vascular when she gets an appointment. The right side is healed the left side is still got some small open areas She has appt on Wed at Vein and vascular at which point she will come in for Nurse visit here 8/16; the patient arrives with all of her wounds closed. She has bilateral Juzo stockings. She saw Dr. Oneida Alar on 8/11 to review her reflux studies from the same day. On the right she had no evidence of DVT or SVT She did have deep venous reflux in the common femoral vein as well as superficial venous reflux in the . small saphenous vein of the mid calf on the  left again no thrombus in the  deep vein or superficial veins. Deep vein reflux in the common femoral vein as well as the great saphenous vein. She saw Dr. Oneida Alar in consult. Also of note he noted that she had a normal arterial scan done in [place] in April 2021, we were never able to obtain this. His feeling was that the patient did have a dilated greater saphenous vein however minimal to no evidence of reflux in her superficial venous system. She also was felt to have mild deep vein reflux and some degree of lymphedema. He recommended continued compression of the lower extremities with intermittent Unna boots if necessary. She was told to elevate her legs. If she develops recurrent ulcerations they were not opposed to revisiting the repeat venous reflux exam to see if she develops worsening reflux for consideration of laser ablation Objective Constitutional Patient is hypertensive.. Pulse regular and within target range for patient.Marland Kitchen Respirations regular, non-labored and within target range.. Temperature is normal and within the target range for the patient.Marland Kitchen Appears in no distress. Vitals Time Taken: 2:28 PM, Height: 63 in, Weight: 220 lbs, BMI: 39, Temperature: 98.0 F, Pulse: 89 bpm, Respiratory Rate: 18 breaths/min, Blood Pressure: 154/72 mmHg, Capillary Blood Glucose: 124 mg/dl. Cardiovascular Pedal pulses are palpable. General Notes: Wound exam; the area on the left medial lower leg is closed as is the right. She has changes of chronic venous insufficiency with very adherent skin in the distal lower extremities bilaterally. Some degree of lymphedema. Integumentary (Hair, Skin) Wound #1 status is Open. Original cause of wound was Gradually Appeared. The wound is located on the Left,Circumferential Lower Leg. The wound measures 0cm length x 0cm width x 0cm depth; 0cm^2 area and 0cm^3 volume. There is no tunneling or undermining noted. There is a none present amount of drainage noted. The wound margin is distinct with  the outline attached to the wound base. There is no granulation within the wound bed. There is no necrotic tissue within the wound bed. Assessment Active Problems ICD-10 Chronic venous hypertension (idiopathic) with ulcer and inflammation of bilateral lower extremity Type 2 diabetes mellitus with other skin ulcer Non-pressure chronic ulcer of left calf limited to breakdown of skin Non-pressure chronic ulcer of right calf with other specified severity Lymphedema, not elsewhere classified Plan Discharge From [facility] Services: Discharge from [place] Skin Barriers/Peri-Wound Care: Moisturizing lotion - both legs daily Edema Control: Avoid standing for long periods of time Elevate legs to the level of the heart or above for 30 minutes daily and/or when sitting, a frequency of: - throughout the day Exercise regularly Support Garment 30-40 mm/Hg pressure to: - Juzo compression garment to both legs daily. Apply first thing in the morning, remove at bedtime. #1 the patient can be discharged to her own stockings. 2. She was counseled with regards to the benefits of exercise, leg elevation when she is sitting and consistent use of her compression stockings. If this may fails to maintain skin integrity we can can reconsider the issue of venous ablation as stated by Dr. Oneida Alar. 3. Fortunately she does not have an arterial issue. Dr. Oneida Alar had this study from [place], we were never able to obtain it Electronic Signature(s) Signed: 04/25/2020 5:53:04 PM By: Linton Ham MD Entered By: Linton Ham on 04/25/2020 15:45:30 -------------------------------------------------------------------------------- SuperBill Details Patient Name: Date of Service: [name] 04/25/2020 Medical Record Number: [id number] Patient Account Number: [id number] Date of Birth/Sex: Treating RN: 12-24-1940 (79 y.o. [name] Primary Care  Provider: Margaretha Sheffield Other Clinician: Referring  Provider: Treating Provider/Extender: Benedetto Coons in Treatment: 4 Diagnosis Coding ICD-10 Codes Code Description [phone] Chronic venous hypertension (idiopathic) with ulcer and inflammation of bilateral lower extremity E11.622 Type 2 diabetes mellitus with other skin ulcer L97.221 Non-pressure chronic ulcer of left calf limited to breakdown of skin L97.218 Non-pressure chronic ulcer of right calf with other specified severity I89.0 Lymphedema, not elsewhere classified Facility Procedures CPT4 Code: [id number] Description: [phone] - WOUND CARE VISIT-LEV 2 EST PT Modifier: Quantity: 1 Physician Procedures : CPT4 Code Description Modifier [id number] - WC PHYS LEVEL 3 - EST PT ICD-10 Diagnosis Description I87.333 Chronic venous hypertension (idiopathic) with ulcer and inflammation of bilateral lower extremity I89.0 Lymphedema, not elsewhere classified  L97.221 Non-pressure chronic ulcer of left calf limited to breakdown of skin L97.218 Non-pressure chronic ulcer of right calf with other specified severity Quantity: 1 Electronic Signature(s) Signed: 04/25/2020 5:53:04 PM By: Linton Ham MD Entered By: Linton Ham on 04/25/2020 15:45:54

## 2020-05-03 NOTE — Progress Notes (Signed)
ANNLEE, GLANDON ([id number]) Visit Report for 04/11/2020 SuperBill Details Patient Name: Date of Service: THERESIA, PREE 04/11/2020 Medical Record Number: [id number] Patient Account Number: [id number] Date of Birth/Sex: Treating RN: [birth year] (79 y.o. Clearnce Sorrel Primary Care Provider: Margaretha Sheffield Other Clinician: Referring Provider: Treating Provider/Extender: Benedetto Coons in Treatment: 2 Diagnosis Coding ICD-10 Codes Code Description [phone] Chronic venous hypertension (idiopathic) with ulcer and inflammation of bilateral lower extremity E11.622 Type 2 diabetes mellitus with other skin ulcer L97.221 Non-pressure chronic ulcer of left calf limited to breakdown of skin L97.218 Non-pressure chronic ulcer of right calf with other specified severity I89.0 Lymphedema, not elsewhere classified Facility Procedures CPT4 Description Modifier Quantity Code [id number] BILATERAL: Application of multi-layer venous compression system; leg (below knee), including ankle and 1 foot. Electronic Signature(s) Signed: 04/11/2020 12:26:20 PM By: Kela Millin Signed: 05/02/2020 4:33:08 PM By: Linton Ham MD Entered By: Kela Millin on 04/11/2020 12:22:37

## 2020-05-07 NOTE — Progress Notes (Signed)
[name], [name] ([id number]) Visit Report for 04/25/2020 Arrival Information Details Patient Name: Date of Service: [name], [name] 04/25/2020 1:30 PM Medical Record Number: [id number] Patient Account Number: [id number] Date of Birth/Sex: Treating RN: 1941-01-09 (79 y.o. [name] Primary Care Aleksey Newbern: Margaretha Sheffield Other Clinician: Referring Racquelle Hyser: Treating Stavros Cail/Extender: Benedetto Coons in Treatment: 4 Visit Information History Since Last Visit Added or deleted any medications: No Patient Arrived: Ambulatory Any new allergies or adverse reactions: No Arrival Time: 14:26 Had a fall or experienced change in No Accompanied By: self activities of daily living that may affect Transfer Assistance: None risk of falls: Patient Identification Verified: Yes Signs or symptoms of abuse/neglect since last visito No Secondary Verification Process Completed: Yes Hospitalized since last visit: No Patient Requires Transmission-Based Precautions: No Implantable device outside of the clinic excluding No cellular tissue based products placed in the center since last visit: Has Dressing in Place as Prescribed: Yes Pain Present Now: No Electronic Signature(s) Signed: 04/25/2020 2:40:03 PM By: Sandre Kitty Entered By: Sandre Kitty on 04/25/2020 14:28:32 -------------------------------------------------------------------------------- Clinic Level of Care Assessment Details Patient Name: Date of Service: [name], [name] 04/25/2020 1:30 PM Medical Record Number: [id number] Patient Account Number: [id number] Date of Birth/Sex: Treating RN: 07-29-1941 (79 y.o. [name] Primary Care Jamirah Zelaya: Margaretha Sheffield Other Clinician: Referring Garion Wempe: Treating Amnah Breuer/Extender: Benedetto Coons in Treatment: 4 Clinic Level of Care Assessment Items TOOL 4 Quantity Score X- 1 0 Use when only an EandM is performed on FOLLOW-UP  visit ASSESSMENTS - Nursing Assessment / Reassessment X- 1 10 Reassessment of Co-morbidities (includes updates in patient status) X- 1 5 Reassessment of Adherence to Treatment Plan ASSESSMENTS - Wound and Skin A ssessment / Reassessment X - Simple Wound Assessment / Reassessment - one wound 1 5 []  - 0 Complex Wound Assessment / Reassessment - multiple wounds []  - 0 Dermatologic / Skin Assessment (not related to wound area) ASSESSMENTS - Focused Assessment []  - 0 Circumferential Edema Measurements - multi extremities []  - 0 Nutritional Assessment / Counseling / Intervention []  - 0 Lower Extremity Assessment (monofilament, tuning fork, pulses) []  - 0 Peripheral Arterial Disease Assessment (using hand held doppler) ASSESSMENTS - Ostomy and/or Continence Assessment and Care []  - 0 Incontinence Assessment and Management []  - 0 Ostomy Care Assessment and Management (repouching, etc.) PROCESS - Coordination of Care X - Simple Patient / Family Education for ongoing care 1 15 []  - 0 Complex (extensive) Patient / Family Education for ongoing care X- 1 10 Staff obtains Programmer, systems, Records, T Results / Process Orders est []  - 0 Staff telephones HHA, Nursing Homes / Clarify orders / etc []  - 0 Routine Transfer to another Facility (non-emergent condition) []  - 0 Routine Hospital Admission (non-emergent condition) []  - 0 New Admissions / Biomedical engineer / Ordering NPWT Apligraf, etc. , []  - 0 Emergency Hospital Admission (emergent condition) X- 1 10 Simple Discharge Coordination []  - 0 Complex (extensive) Discharge Coordination PROCESS - Special Needs []  - 0 Pediatric / Minor Patient Management []  - 0 Isolation Patient Management []  - 0 Hearing / Language / Visual special needs []  - 0 Assessment of Community assistance (transportation, D/C planning, etc.) []  - 0 Additional assistance / Altered mentation []  - 0 Support Surface(s) Assessment (bed, cushion, seat,  etc.) INTERVENTIONS - Wound Cleansing / Measurement X - Simple Wound Cleansing - one wound 1 5 []  - 0 Complex Wound Cleansing - multiple wounds X- 1 5 Wound Imaging (photographs - any number  of wounds) []  - 0 Wound Tracing (instead of photographs) X- 1 5 Simple Wound Measurement - one wound []  - 0 Complex Wound Measurement - multiple wounds INTERVENTIONS - Wound Dressings []  - 0 Small Wound Dressing one or multiple wounds []  - 0 Medium Wound Dressing one or multiple wounds []  - 0 Large Wound Dressing one or multiple wounds []  - 0 Application of Medications - topical []  - 0 Application of Medications - injection INTERVENTIONS - Miscellaneous []  - 0 External ear exam []  - 0 Specimen Collection (cultures, biopsies, blood, body fluids, etc.) []  - 0 Specimen(s) / Culture(s) sent or taken to Lab for analysis []  - 0 Patient Transfer (multiple staff / Civil Service fast streamer / Similar devices) []  - 0 Simple Staple / Suture removal (25 or less) []  - 0 Complex Staple / Suture removal (26 or more) []  - 0 Hypo / Hyperglycemic Management (close monitor of Blood Glucose) []  - 0 Ankle / Brachial Index (ABI) - do not check if billed separately X- 1 5 Vital Signs Has the patient been seen at the hospital within the last three years: Yes Total Score: 75 Level Of Care: New/Established - Level 2 Electronic Signature(s) Signed: 04/25/2020 5:14:32 PM By: Levan Hurst RN, BSN Entered By: Levan Hurst on 04/25/2020 15:14:47 -------------------------------------------------------------------------------- Multi Wound Chart Details Patient Name: Date of Service: [name] 04/25/2020 1:30 PM Medical Record Number: [id number] Patient Account Number: [id number] Date of Birth/Sex: Treating RN: 04-07-1941 (79 y.o. [name] Primary Care Leia Coletti: Margaretha Sheffield Other Clinician: Referring Toy Eisemann: Treating Heer Justiss/Extender: Benedetto Coons in Treatment:  4 Vital Signs Height(in): 63 Capillary Blood Glucose(mg/dl): 124 Weight(lbs): 220 Pulse(bpm): 89 Body Mass Index(BMI): 39 Blood Pressure(mmHg): 154/72 Temperature(F): 98.0 Respiratory Rate(breaths/min): 18 Photos: [1:No Photos Left, Circumferential Lower Leg] [N/A:N/A N/A] Wound Location: [1:Gradually Appeared] [N/A:N/A] Wounding Event: [1:Diabetic Wound/Ulcer of the Lower] [N/A:N/A] Primary Etiology: [1:Extremity Congestive Heart Failure,] [N/A:N/A] Comorbid History: [1:Hypertension, Peripheral Venous Disease, Type II Diabetes 10/12/2019] [N/A:N/A] Date Acquired: [1:4] [N/A:N/A] Weeks of Treatment: [1:Open] [N/A:N/A] Wound Status: [1:Yes] [N/A:N/A] Clustered Wound: [1:0x0x0] [N/A:N/A] Measurements L x W x D (cm) [1:0] [N/A:N/A] A (cm) : rea [1:0] [N/A:N/A] Volume (cm) : [1:100.00%] [N/A:N/A] % Reduction in A rea: [1:100.00%] [N/A:N/A] % Reduction in Volume: [1:Grade 2] [N/A:N/A] Classification: [1:None Present] [N/A:N/A] Exudate A mount: [1:Distinct, outline attached] [N/A:N/A] Wound Margin: [1:None Present (0%)] [N/A:N/A] Granulation A mount: [1:None Present (0%)] [N/A:N/A] Necrotic A mount: [1:Fascia: No] [N/A:N/A] Exposed Structures: [1:Fat Layer (Subcutaneous Tissue): No Tendon: No Muscle: No Joint: No Bone: No Large (67-100%)] [N/A:N/A] Treatment Notes Electronic Signature(s) Signed: 04/25/2020 5:14:32 PM By: Levan Hurst RN, BSN Signed: 04/25/2020 5:53:04 PM By: Linton Ham MD Entered By: Linton Ham on 04/25/2020 15:39:27 -------------------------------------------------------------------------------- Multi-Disciplinary Care Plan Details Patient Name: Date of Service: [name], [name] 04/25/2020 1:30 PM Medical Record Number: [id number] Patient Account Number: [id number] Date of Birth/Sex: Treating RN: 1940/11/05 (79 y.o. [name] Primary Care Wardell Pokorski: Margaretha Sheffield Other Clinician: Referring Christerpher Clos: Treating Ngozi Alvidrez/Extender: Benedetto Coons in Treatment: 4 Active Inactive Electronic Signature(s) Signed: 04/25/2020 5:14:32 PM By: Levan Hurst RN, BSN Entered By: Levan Hurst on 04/25/2020 15:14:07 -------------------------------------------------------------------------------- Pain Assessment Details Patient Name: Date of Service: [name], [name] 04/25/2020 1:30 PM Medical Record Number: [id number] Patient Account Number: [id number] Date of Birth/Sex: Treating RN: 1941/01/19 (79 y.o. [name] Primary Care Ranae Casebier: Margaretha Sheffield Other Clinician: Referring Zandrea Kenealy: Treating Dereon Williamsen/Extender: Benedetto Coons in Treatment: 4 Active Problems Location of Pain Severity  and Description of Pain Patient Has Paino No Site Locations Pain Management and Medication Current Pain Management: Electronic Signature(s) Signed: 04/25/2020 2:40:03 PM By: Sandre Kitty Signed: 04/25/2020 5:14:32 PM By: Levan Hurst RN, BSN Entered By: Sandre Kitty on 04/25/2020 14:29:24 -------------------------------------------------------------------------------- Patient/Caregiver Education Details Patient Name: Date of Service: [name] 8/16/2021andnbsp1:30 PM Medical Record Number: [id number] Patient Account Number: [id number] Date of Birth/Gender: Treating RN: 02/06/1941 (79 y.o. [name] Primary Care Physician: Margaretha Sheffield Other Clinician: Referring Physician: Treating Physician/Extender: Benedetto Coons in Treatment: 4 Education Assessment Education Provided To: Patient Education Topics Provided Venous: Methods: Explain/Verbal Responses: State content correctly Wound/Skin Impairment: Methods: Explain/Verbal Responses: State content correctly Electronic Signature(s) Signed: 04/25/2020 5:14:32 PM By: Levan Hurst RN, BSN Entered By: Levan Hurst on 04/25/2020  15:14:23 -------------------------------------------------------------------------------- Wound Assessment Details Patient Name: Date of Service: [name], [name] 04/25/2020 1:30 PM Medical Record Number: [id number] Patient Account Number: [id number] Date of Birth/Sex: Treating RN: [birth year] (79 y.o. [name] Primary Care Melinna Linarez: Margaretha Sheffield Other Clinician: Referring Rufino Staup: Treating Cosme Jacob/Extender: Benedetto Coons in Treatment: 4 Wound Status Wound Number: 1 Primary Diabetic Wound/Ulcer of the Lower Extremity Etiology: Wound Location: Left, Circumferential Lower Leg Wound Open Wounding Event: Gradually Appeared Status: Date Acquired: 10/12/2019 Comorbid Congestive Heart Failure, Hypertension, Peripheral Venous Weeks Of Treatment: 4 History: Disease, Type II Diabetes Clustered Wound: Yes Photos Photo Uploaded By: Mikeal Hawthorne on 04/28/2020 15:58:11 Wound Measurements Length: (cm) Width: (cm) Depth: (cm) Area: (cm) Volume: (cm) 0 % Reduction in Area: 100% 0 % Reduction in Volume: 100% 0 Epithelialization: Large (67-100%) 0 Tunneling: No 0 Undermining: No Wound Description Classification: Grade 2 Wound Margin: Distinct, outline attached Exudate Amount: None Present Foul Odor After Cleansing: No Slough/Fibrino No Wound Bed Granulation Amount: None Present (0%) Exposed Structure Necrotic Amount: None Present (0%) Fascia Exposed: No Fat Layer (Subcutaneous Tissue) Exposed: No Tendon Exposed: No Muscle Exposed: No Joint Exposed: No Bone Exposed: No Electronic Signature(s) Signed: 04/25/2020 5:14:32 PM By: Levan Hurst RN, BSN Signed: 05/06/2020 5:50:16 PM By: Carlene Coria RN Previous Signature: 04/25/2020 2:40:03 PM Version By: Sandre Kitty Entered By: Carlene Coria on 04/25/2020 14:44:25 -------------------------------------------------------------------------------- [place] Details Patient Name: Date of  Service: [name], [name] 04/25/2020 1:30 PM Medical Record Number: [id number] Patient Account Number: [id number] Date of Birth/Sex: Treating RN: [birth year] (79 y.o. [name] Primary Care Ramiya Delahunty: Margaretha Sheffield Other Clinician: Referring Tarshia Kot: Treating Iyan Flett/Extender: Benedetto Coons in Treatment: 4 Vital Signs Time Taken: 14:28 Temperature (F): 98.0 Height (in): 63 Pulse (bpm): 89 Weight (lbs): 220 Respiratory Rate (breaths/min): 18 Body Mass Index (BMI): 39 Blood Pressure (mmHg): 154/72 Capillary Blood Glucose (mg/dl): 124 Reference Range: 80 - 120 mg / dl Electronic Signature(s) Signed: 04/25/2020 2:40:03 PM By: Sandre Kitty Entered By: Sandre Kitty on 04/25/2020 14:29:18

## 2020-05-18 ENCOUNTER — Encounter (HOSPITAL_COMMUNITY): Payer: Self-pay

## 2020-05-18 ENCOUNTER — Inpatient Hospital Stay (HOSPITAL_COMMUNITY)
Admission: EM | Admit: 2020-05-18 | Discharge: 2020-05-24 | DRG: 493 | Disposition: A | Discharge status: Skilled Nursing Facility | Payer: Medicare PPO | Attending: Orthopedic Surgery | Admitting: Orthopedic Surgery

## 2020-05-18 ENCOUNTER — Other Ambulatory Visit: Payer: Self-pay

## 2020-05-18 ENCOUNTER — Emergency Department (HOSPITAL_COMMUNITY): Payer: Medicare PPO

## 2020-05-18 DIAGNOSIS — W010XXA Fall on same level from slipping, tripping and stumbling without subsequent striking against object, initial encounter: Secondary | ICD-10-CM | POA: Diagnosis present

## 2020-05-18 DIAGNOSIS — F329 Major depressive disorder, single episode, unspecified: Secondary | ICD-10-CM | POA: Diagnosis present

## 2020-05-18 DIAGNOSIS — Z794 Long term (current) use of insulin: Secondary | ICD-10-CM | POA: Diagnosis not present

## 2020-05-18 DIAGNOSIS — Z87891 Personal history of nicotine dependence: Secondary | ICD-10-CM | POA: Diagnosis not present

## 2020-05-18 DIAGNOSIS — S82461A Displaced segmental fracture of shaft of right fibula, initial encounter for closed fracture: Secondary | ICD-10-CM | POA: Diagnosis present

## 2020-05-18 DIAGNOSIS — I130 Hypertensive heart and chronic kidney disease with heart failure and stage 1 through stage 4 chronic kidney disease, or unspecified chronic kidney disease: Secondary | ICD-10-CM | POA: Diagnosis present

## 2020-05-18 DIAGNOSIS — R296 Repeated falls: Secondary | ICD-10-CM | POA: Diagnosis present

## 2020-05-18 DIAGNOSIS — Z91041 Radiographic dye allergy status: Secondary | ICD-10-CM

## 2020-05-18 DIAGNOSIS — S82839A Other fracture of upper and lower end of unspecified fibula, initial encounter for closed fracture: Secondary | ICD-10-CM | POA: Diagnosis present

## 2020-05-18 DIAGNOSIS — Z888 Allergy status to other drugs, medicaments and biological substances status: Secondary | ICD-10-CM

## 2020-05-18 DIAGNOSIS — E785 Hyperlipidemia, unspecified: Secondary | ICD-10-CM | POA: Diagnosis present

## 2020-05-18 DIAGNOSIS — E1121 Type 2 diabetes mellitus with diabetic nephropathy: Secondary | ICD-10-CM | POA: Diagnosis not present

## 2020-05-18 DIAGNOSIS — N179 Acute kidney failure, unspecified: Secondary | ICD-10-CM | POA: Diagnosis present

## 2020-05-18 DIAGNOSIS — Z886 Allergy status to analgesic agent status: Secondary | ICD-10-CM

## 2020-05-18 DIAGNOSIS — Z880 Allergy status to penicillin: Secondary | ICD-10-CM

## 2020-05-18 DIAGNOSIS — F419 Anxiety disorder, unspecified: Secondary | ICD-10-CM | POA: Diagnosis present

## 2020-05-18 DIAGNOSIS — E1140 Type 2 diabetes mellitus with diabetic neuropathy, unspecified: Secondary | ICD-10-CM | POA: Diagnosis present

## 2020-05-18 DIAGNOSIS — M1990 Unspecified osteoarthritis, unspecified site: Secondary | ICD-10-CM | POA: Diagnosis present

## 2020-05-18 DIAGNOSIS — S82309A Unspecified fracture of lower end of unspecified tibia, initial encounter for closed fracture: Secondary | ICD-10-CM | POA: Diagnosis present

## 2020-05-18 DIAGNOSIS — M48061 Spinal stenosis, lumbar region without neurogenic claudication: Secondary | ICD-10-CM | POA: Diagnosis present

## 2020-05-18 DIAGNOSIS — Z20822 Contact with and (suspected) exposure to covid-19: Secondary | ICD-10-CM | POA: Diagnosis present

## 2020-05-18 DIAGNOSIS — Z85038 Personal history of other malignant neoplasm of large intestine: Secondary | ICD-10-CM

## 2020-05-18 DIAGNOSIS — Z882 Allergy status to sulfonamides status: Secondary | ICD-10-CM

## 2020-05-18 DIAGNOSIS — N1831 Chronic kidney disease, stage 3a: Secondary | ICD-10-CM | POA: Diagnosis not present

## 2020-05-18 DIAGNOSIS — Y92009 Unspecified place in unspecified non-institutional (private) residence as the place of occurrence of the external cause: Secondary | ICD-10-CM

## 2020-05-18 DIAGNOSIS — G894 Chronic pain syndrome: Secondary | ICD-10-CM | POA: Diagnosis present

## 2020-05-18 DIAGNOSIS — Z8249 Family history of ischemic heart disease and other diseases of the circulatory system: Secondary | ICD-10-CM

## 2020-05-18 DIAGNOSIS — Z6841 Body Mass Index (BMI) 40.0 and over, adult: Secondary | ICD-10-CM

## 2020-05-18 DIAGNOSIS — S82201A Unspecified fracture of shaft of right tibia, initial encounter for closed fracture: Principal | ICD-10-CM | POA: Diagnosis present

## 2020-05-18 DIAGNOSIS — I5032 Chronic diastolic (congestive) heart failure: Secondary | ICD-10-CM | POA: Diagnosis present

## 2020-05-18 DIAGNOSIS — Z79899 Other long term (current) drug therapy: Secondary | ICD-10-CM

## 2020-05-18 DIAGNOSIS — D62 Acute posthemorrhagic anemia: Secondary | ICD-10-CM | POA: Diagnosis not present

## 2020-05-18 DIAGNOSIS — K219 Gastro-esophageal reflux disease without esophagitis: Secondary | ICD-10-CM | POA: Diagnosis present

## 2020-05-18 DIAGNOSIS — Z419 Encounter for procedure for purposes other than remedying health state, unspecified: Secondary | ICD-10-CM

## 2020-05-18 DIAGNOSIS — N1832 Chronic kidney disease, stage 3b: Secondary | ICD-10-CM | POA: Diagnosis present

## 2020-05-18 DIAGNOSIS — I10 Essential (primary) hypertension: Secondary | ICD-10-CM | POA: Diagnosis present

## 2020-05-18 DIAGNOSIS — Z9181 History of falling: Secondary | ICD-10-CM

## 2020-05-18 DIAGNOSIS — D696 Thrombocytopenia, unspecified: Secondary | ICD-10-CM | POA: Diagnosis present

## 2020-05-18 DIAGNOSIS — G2581 Restless legs syndrome: Secondary | ICD-10-CM | POA: Diagnosis present

## 2020-05-18 DIAGNOSIS — D638 Anemia in other chronic diseases classified elsewhere: Secondary | ICD-10-CM | POA: Diagnosis present

## 2020-05-18 DIAGNOSIS — Z885 Allergy status to narcotic agent status: Secondary | ICD-10-CM

## 2020-05-18 DIAGNOSIS — M79661 Pain in right lower leg: Secondary | ICD-10-CM | POA: Diagnosis present

## 2020-05-18 DIAGNOSIS — M797 Fibromyalgia: Secondary | ICD-10-CM | POA: Diagnosis present

## 2020-05-18 DIAGNOSIS — R42 Dizziness and giddiness: Secondary | ICD-10-CM | POA: Diagnosis not present

## 2020-05-18 DIAGNOSIS — E119 Type 2 diabetes mellitus without complications: Secondary | ICD-10-CM

## 2020-05-18 DIAGNOSIS — W19XXXA Unspecified fall, initial encounter: Secondary | ICD-10-CM | POA: Diagnosis not present

## 2020-05-18 DIAGNOSIS — Z905 Acquired absence of kidney: Secondary | ICD-10-CM | POA: Diagnosis not present

## 2020-05-18 DIAGNOSIS — Z981 Arthrodesis status: Secondary | ICD-10-CM

## 2020-05-18 DIAGNOSIS — Z950 Presence of cardiac pacemaker: Secondary | ICD-10-CM

## 2020-05-18 DIAGNOSIS — E1122 Type 2 diabetes mellitus with diabetic chronic kidney disease: Secondary | ICD-10-CM | POA: Diagnosis present

## 2020-05-18 DIAGNOSIS — S82874A Nondisplaced pilon fracture of right tibia, initial encounter for closed fracture: Secondary | ICD-10-CM | POA: Diagnosis not present

## 2020-05-18 DIAGNOSIS — G4700 Insomnia, unspecified: Secondary | ICD-10-CM | POA: Diagnosis present

## 2020-05-18 DIAGNOSIS — E875 Hyperkalemia: Secondary | ICD-10-CM | POA: Diagnosis not present

## 2020-05-18 DIAGNOSIS — I495 Sick sinus syndrome: Secondary | ICD-10-CM | POA: Diagnosis present

## 2020-05-18 LAB — CBC WITH DIFFERENTIAL/PLATELET
Abs Immature Granulocytes: 0.04 10*3/uL (ref 0.00–0.07)
Basophils Absolute: 0 10*3/uL (ref 0.0–0.1)
Basophils Relative: 1 %
Eosinophils Absolute: 0.1 10*3/uL (ref 0.0–0.5)
Eosinophils Relative: 1 %
HCT: 38.7 % (ref 36.0–46.0)
Hemoglobin: 12 g/dL (ref 12.0–15.0)
Immature Granulocytes: 1 %
Lymphocytes Relative: 12 %
Lymphs Abs: 1 10*3/uL (ref 0.7–4.0)
MCH: 29.1 pg (ref 26.0–34.0)
MCHC: 31 g/dL (ref 30.0–36.0)
MCV: 93.7 fL (ref 80.0–100.0)
Monocytes Absolute: 0.6 10*3/uL (ref 0.1–1.0)
Monocytes Relative: 7 %
Neutro Abs: 6.9 10*3/uL (ref 1.7–7.7)
Neutrophils Relative %: 78 %
Platelets: 138 10*3/uL — ABNORMAL LOW (ref 150–400)
RBC: 4.13 MIL/uL (ref 3.87–5.11)
RDW: 13.5 % (ref 11.5–15.5)
WBC: 8.7 10*3/uL (ref 4.0–10.5)
nRBC: 0 % (ref 0.0–0.2)

## 2020-05-18 LAB — BASIC METABOLIC PANEL
Anion gap: 10 (ref 5–15)
BUN: 54 mg/dL — ABNORMAL HIGH (ref 8–23)
CO2: 26 mmol/L (ref 22–32)
Calcium: 8.7 mg/dL — ABNORMAL LOW (ref 8.9–10.3)
Chloride: 103 mmol/L (ref 98–111)
Creatinine, Ser: 2.1 mg/dL — ABNORMAL HIGH (ref 0.44–1.00)
GFR calc Af Amer: 25 mL/min — ABNORMAL LOW (ref >60)
GFR calc non Af Amer: 22 mL/min — ABNORMAL LOW (ref >60)
Glucose, Bld: 127 mg/dL — ABNORMAL HIGH (ref 70–99)
Potassium: 4.4 mmol/L (ref 3.5–5.1)
Sodium: 139 mmol/L (ref 135–145)

## 2020-05-18 LAB — GLUCOSE, CAPILLARY: Glucose-Capillary: 270 mg/dL — ABNORMAL HIGH (ref 70–99)

## 2020-05-18 LAB — SARS CORONAVIRUS 2 BY RT PCR (HOSPITAL ORDER, PERFORMED IN ~~LOC~~ HOSPITAL LAB): SARS Coronavirus 2: NEGATIVE

## 2020-05-18 MED ORDER — FENTANYL CITRATE (PF) 100 MCG/2ML IJ SOLN
100.0000 ug | INTRAMUSCULAR | Status: DC | PRN
  Administered 2020-05-18 – 2020-05-21 (×14): 100 ug via INTRAVENOUS
  Filled 2020-05-18 (×14): qty 2

## 2020-05-18 MED ORDER — FENTANYL CITRATE (PF) 100 MCG/2ML IJ SOLN
200.0000 ug | Freq: Once | INTRAMUSCULAR | Status: AC
  Administered 2020-05-18: 200 ug via INTRAVENOUS
  Filled 2020-05-18: qty 4

## 2020-05-18 NOTE — ED Triage Notes (Signed)
Pt arrived via EMS from home, trip and fall this afternoon, pain to right ankle.   18G R outer AC by EMS Given 237mcg Fentanyl en route

## 2020-05-18 NOTE — ED Notes (Signed)
Attempted to call report, RN will call back.

## 2020-05-18 NOTE — ED Notes (Signed)
Patient transferred to [facility] for ortho consult, via stretcher by [facility].

## 2020-05-18 NOTE — ED Notes (Signed)
Report to [facility] ortho.

## 2020-05-18 NOTE — ED Provider Notes (Signed)
[facility] DEPT Provider Note   CSN: [id number] Arrival date & time: 05/18/20  1411     History No chief complaint on file.   [name] is a 79 y.o. female.  HPI    Patient presents after mechanical fall that occurred about 2 hours prior to ED arrival. She arrives via EMS. Patient knowledges multiple medical issues, but states that she was in her usual state of health to the fall, which she recalls was in entirety. She states that she was reaching for telephone, tripped, and twisted her ankle. She denies any pain in any extremity, specifically her hips, head, neck as well. No loss of consciousness, no new weakness anywhere, though she does have pain with range of motion or any motion of her right leg, and inability to bear weight. EMS provided 200 mcg of fentanyl in route and she notes that her pain has gone from severe to tolerable.  It is sharp. Past Medical History:  Diagnosis Date  . Anxiety   . Arthritis   . Cancer ([place])    colon  . Chronic kidney disease    stage 3  . Chronic pain syndrome   . Diabetes ([place])   . Diabetic neuropathy ([place])   . Dyslipidemia   . Early cataracts, bilateral   . Fibromyalgia   . GERD (gastroesophageal reflux disease)   . H/O syncope   . Heart murmur   . Hypertension   . Lumbar spinal stenosis    and scoliosis  . Pneumonia   . PONV (postoperative nausea and vomiting)   . Presence of permanent cardiac pacemaker   . Restless legs   . Spinal headache   . Thyroid nodule     Patient Active Problem List   Diagnosis Date Noted  . Elevated serum creatinine 04/09/2018  . Spinal stenosis at L4-L5 level 02/02/2016  . Cardiac device in situ   . Sinus node dysfunction ([place]) 07/21/2015  . Bradycardia 05/10/2015    Past Surgical History:  Procedure Laterality Date  . APPENDECTOMY    . BREAST SURGERY     lumpectomy  . COLON SURGERY    . DILATION AND CURETTAGE OF UTERUS    . EP IMPLANTABLE DEVICE N/A  07/21/2015   Procedure: Pacemaker Implant;  Surgeon: Deboraha Sprang, MD;  Location: [facility];  Service: Cardiovascular;  Laterality: N/A;  . LUMBAR LAMINECTOMY/DECOMPRESSION MICRODISCECTOMY N/A 02/02/2016   Procedure: DECOMPRESSION L4-L5 WITH INSITE 2 FUSION ;  Surgeon: Melina Schools, MD;  Location: [facility];  Service: Orthopedics;  Laterality: N/A;  . PARTIAL NEPHRECTOMY    . RIGHT/LEFT HEART CATH AND CORONARY ANGIOGRAPHY N/A 05/19/2018   Procedure: RIGHT/LEFT HEART CATH AND CORONARY ANGIOGRAPHY;  Surgeon: Jolaine Artist, MD;  Location: [facility];  Service: Cardiovascular;  Laterality: N/A;  . TONSILLECTOMY    . TUBAL LIGATION       OB History   No obstetric history on file.     Family History  Problem Relation Age of Onset  . CAD Other   . Sick sinus syndrome Brother        has a PPM    Social History   Tobacco Use  . Smoking status: Former Smoker    Start date: 09/10/1953    Quit date: 09/11/1983    Years since quitting: 36.7  . Smokeless tobacco: Never Used  Vaping Use  . Vaping Use: Never used  Substance Use Topics  . Alcohol use: Yes  Alcohol/week: 0.0 standard drinks    Comment: rare glass of wine  . Drug use: No    Home Medications Prior to Admission medications   Medication Sig Start Date End Date Taking? Authorizing Provider  ACCU-CHEK AVIVA PLUS test strip  08/14/19   [provider]  Accu-Chek Softclix Lancets lancets  02/08/20   [provider]  acetaminophen (TYLENOL) 650 MG CR tablet Take 1,300 mg by mouth every 8 (eight) hours.     [provider]  B-D UF III MINI PEN NEEDLES 31G X 5 MM MISC  09/16/19   [provider]  Blood Glucose Monitoring Suppl (ACCU-CHEK AVIVA PLUS) w/Device KIT  02/08/20   [provider]  Cholecalciferol (VITAMIN D) 2000 UNITS tablet Take 2,000 Units by mouth daily.    [provider]  cyclobenzaprine (FLEXERIL) 10 MG tablet Take 10 mg by mouth 3 (three) times  daily as needed for muscle spasms.    [provider]  doxylamine, Sleep, (UNISOM) 25 MG tablet Take 25 mg by mouth at bedtime.    [provider]  DULoxetine (CYMBALTA) 20 MG capsule Take 20 mg by mouth daily.    [provider]  empagliflozin (JARDIANCE) 10 MG TABS tablet Take 1 tablet (10 mg total) by mouth daily before breakfast. 03/08/20   Bensimhon, Shaune Pascal, MD  glucose blood (ACCU-CHEK AVIVA PLUS) test strip TK TO TEST BLOOD SUGAR QD 08/27/17   [provider]  glucose blood (ACCU-CHEK AVIVA PLUS) test strip daily    [provider]  hydrALAZINE (APRESOLINE) 100 MG tablet Take 1 tablet (100 mg total) by mouth 3 (three) times daily. 02/05/20   Bensimhon, Shaune Pascal, MD  Insulin Degludec (TRESIBA FLEXTOUCH [place]) Inject 110 Units into the skin.    [provider]  insulin lispro (HUMALOG KWIKPEN) 100 UNIT/ML KwikPen Humalog KwikPen (U-100) Insulin 100 unit/mL subcutaneous  Inject 10 units eveyday before largest meal    [provider]  irbesartan (AVAPRO) 300 MG tablet Take 300 mg by mouth daily. 09/28/17   [provider]  labetalol (NORMODYNE) 200 MG tablet TAKE 1 TABLET(200 MG) BY MOUTH TWICE DAILY 02/09/20   Deboraha Sprang, MD  omeprazole (PRILOSEC OTC) 20 MG tablet Take 20 mg by mouth daily as needed.     [provider]  pramipexole (MIRAPEX) 0.125 MG tablet Take 0.25 mg by mouth 2 (two) times daily.    [provider]  rosuvastatin (CRESTOR) 10 MG tablet Take 10 mg by mouth daily.  06/23/19   [provider]  torsemide (DEMADEX) 20 MG tablet Take 2 tablets (40 mg total) by mouth daily. Take extra at 2pm if weight is 216 or greater 12/28/19   Deboraha Sprang, MD  traMADol (ULTRAM) 50 MG tablet Take 50 mg by mouth 3 (three) times daily as needed for moderate pain.     [provider]    Allergies    Penicillins, Codeine, Adhesive [tape], Aspirin, Iodine, Ivp dye [iodinated diagnostic  agents], and Sulfa antibiotics  Review of Systems   Review of Systems  Constitutional:       Per HPI, otherwise negative  HENT:       Per HPI, otherwise negative  Respiratory:       Per HPI, otherwise negative  Cardiovascular:       Per HPI, otherwise negative  Gastrointestinal: Negative for vomiting.  Endocrine:       Negative aside from HPI  Genitourinary:  Neg aside from HPI   Musculoskeletal:       Per HPI, otherwise negative  Skin: Negative.  Negative for wound.  Neurological: Negative for syncope, weakness and numbness.    Physical Exam Updated Vital Signs BP 115/90 (BP Location: Right Arm)   Pulse 79   Temp 99.2 F (37.3 C) (Oral)   Resp 17   SpO2 95%   Physical Exam Vitals and nursing note reviewed.  Constitutional:      General: She is not in acute distress.    Appearance: She is well-developed. She is not ill-appearing or diaphoretic.  HENT:     Head: Normocephalic and atraumatic.  Eyes:     Conjunctiva/sclera: Conjunctivae normal.  Cardiovascular:     Rate and Rhythm: Normal rate and regular rhythm.     Pulses: Normal pulses.  Pulmonary:     Effort: Pulmonary effort is normal. No respiratory distress.     Breath sounds: No stridor.  Abdominal:     General: There is no distension.  Musculoskeletal:       Legs:  Skin:    General: Skin is warm and dry.  Neurological:     Mental Status: She is alert and oriented to person, place, and time.     Cranial Nerves: No cranial nerve deficit.     ED Results / Procedures / Treatments   Labs (all labs ordered are listed, but only abnormal results are displayed) Labs Reviewed - No data to display  EKG None  Radiology No results found.  Procedures .Ortho Injury Treatment  Date/Time: 05/18/2020 3:59 PM Performed by: Carmin Muskrat, MD Authorized by: Carmin Muskrat, MD   Consent:    Consent obtained:  Verbal   Consent given by:  Patient   Risks discussed:  Fracture   Alternatives  discussed:  No treatment, alternative treatment and immobilizationInjury location: lower leg Location details: right lower leg Injury type: fracture Fracture type: tibial and fibular shafts Pre-procedure neurovascular assessment: neurovascularly intact Pre-procedure distal perfusion: normal Pre-procedure neurological function: normal Pre-procedure range of motion: reduced  Anesthesia: Local anesthesia used: no  Patient sedated: NoManipulation performed: yes Skeletal traction used: yes Immobilization: splint Splint type: long leg Supplies used: Ortho-Glass Post-procedure neurovascular assessment: post-procedure neurovascularly intact Post-procedure distal perfusion: normal Post-procedure neurological function: normal Post-procedure range of motion: unchanged    (including critical care time)  Medications Ordered in ED Medications - No data to display  ED Course  I have reviewed the triage vital signs and the nursing notes.  Pertinent labs & imaging results that were available during my care of the patient were reviewed by me and considered in my medical decision making (see chart for details).   Update:, On repeat exam patient is calm.  I reviewed her x-rays, discussed and demonstrated picture to her and her daughter. Patient found to have distal fibula, distal tibia and proximal fibula fractures.  4:01 PM Patient completed splinting without complication. Discussed case with our orthopedic colleagues, patient will require admission for surgical repair. Patient and daughter aware of this.  Elderly female, baseline ambulatory, with chronic medical issues, but otherwise in her usual state of health presents after mechanical fall, is found to have complex fracture of the right lower extremity. After splinting here, discussion with our Ortho colleagues, the patient was admitted for further monitoring, management.  Final Clinical Impression(s) / ED Diagnoses Final diagnoses:    Fall, initial encounter  Closed fracture of right tibia and fibula, initial encounter     Carmin Muskrat,  MD 05/18/20 1855

## 2020-05-19 ENCOUNTER — Inpatient Hospital Stay (HOSPITAL_COMMUNITY): Payer: Medicare PPO | Admitting: Certified Registered"

## 2020-05-19 ENCOUNTER — Inpatient Hospital Stay (HOSPITAL_COMMUNITY): Payer: Medicare PPO

## 2020-05-19 ENCOUNTER — Other Ambulatory Visit: Payer: Self-pay

## 2020-05-19 ENCOUNTER — Encounter (HOSPITAL_COMMUNITY)
Admission: EM | Disposition: A | Discharge status: Skilled Nursing Facility | Payer: Self-pay | Source: Home / Self Care | Attending: Orthopedic Surgery

## 2020-05-19 ENCOUNTER — Encounter (HOSPITAL_COMMUNITY): Payer: Self-pay | Admitting: Obstetrics & Gynecology

## 2020-05-19 DIAGNOSIS — S82874A Nondisplaced pilon fracture of right tibia, initial encounter for closed fracture: Secondary | ICD-10-CM

## 2020-05-19 DIAGNOSIS — N189 Chronic kidney disease, unspecified: Secondary | ICD-10-CM | POA: Diagnosis present

## 2020-05-19 DIAGNOSIS — N1832 Chronic kidney disease, stage 3b: Secondary | ICD-10-CM

## 2020-05-19 DIAGNOSIS — Z794 Long term (current) use of insulin: Secondary | ICD-10-CM

## 2020-05-19 DIAGNOSIS — D696 Thrombocytopenia, unspecified: Secondary | ICD-10-CM

## 2020-05-19 DIAGNOSIS — E119 Type 2 diabetes mellitus without complications: Secondary | ICD-10-CM

## 2020-05-19 DIAGNOSIS — E785 Hyperlipidemia, unspecified: Secondary | ICD-10-CM | POA: Diagnosis present

## 2020-05-19 DIAGNOSIS — E1121 Type 2 diabetes mellitus with diabetic nephropathy: Secondary | ICD-10-CM

## 2020-05-19 DIAGNOSIS — S82201A Unspecified fracture of shaft of right tibia, initial encounter for closed fracture: Secondary | ICD-10-CM | POA: Diagnosis present

## 2020-05-19 DIAGNOSIS — I10 Essential (primary) hypertension: Secondary | ICD-10-CM | POA: Diagnosis present

## 2020-05-19 DIAGNOSIS — N179 Acute kidney failure, unspecified: Secondary | ICD-10-CM

## 2020-05-19 DIAGNOSIS — K219 Gastro-esophageal reflux disease without esophagitis: Secondary | ICD-10-CM

## 2020-05-19 DIAGNOSIS — W19XXXA Unspecified fall, initial encounter: Secondary | ICD-10-CM

## 2020-05-19 DIAGNOSIS — N1831 Chronic kidney disease, stage 3a: Secondary | ICD-10-CM

## 2020-05-19 HISTORY — PX: TIBIA IM NAIL INSERTION: SHX2516

## 2020-05-19 LAB — GLUCOSE, CAPILLARY
Glucose-Capillary: 91 mg/dL (ref 70–99)
Glucose-Capillary: 119 mg/dL — ABNORMAL HIGH (ref 70–99)
Glucose-Capillary: 143 mg/dL — ABNORMAL HIGH (ref 70–99)

## 2020-05-19 LAB — TYPE AND SCREEN
ABO/RH(D): O POS
Antibody Screen: NEGATIVE

## 2020-05-19 LAB — SURGICAL PCR SCREEN
MRSA, PCR: NEGATIVE
Staphylococcus aureus: NEGATIVE

## 2020-05-19 SURGERY — INSERTION, INTRAMEDULLARY ROD, TIBIA
Anesthesia: General | Site: Leg Lower | Laterality: Right

## 2020-05-19 MED ORDER — ACETAMINOPHEN 325 MG PO TABS: 325.0000 mg | ORAL_TABLET | Freq: Four times a day (QID) | ORAL | Status: DC | PRN

## 2020-05-19 MED ORDER — CEFAZOLIN SODIUM-DEXTROSE 2-4 GM/100ML-% IV SOLN
INTRAVENOUS | Status: AC
  Filled 2020-05-19: qty 100

## 2020-05-19 MED ORDER — CHLORHEXIDINE GLUCONATE 4 % EX LIQD: 60.0000 mL | Freq: Once | CUTANEOUS | Status: DC

## 2020-05-19 MED ORDER — CHLORHEXIDINE GLUCONATE 0.12 % MT SOLN
15.0000 mL | OROMUCOSAL | Status: AC
  Filled 2020-05-19: qty 15

## 2020-05-19 MED ORDER — CHLORHEXIDINE GLUCONATE 0.12 % MT SOLN
OROMUCOSAL | Status: AC
  Administered 2020-05-19: 15 mL via OROMUCOSAL
  Filled 2020-05-19: qty 15

## 2020-05-19 MED ORDER — LIDOCAINE 2% (20 MG/ML) 5 ML SYRINGE
INTRAMUSCULAR | Status: DC | PRN
  Administered 2020-05-19: 100 mg via INTRAVENOUS

## 2020-05-19 MED ORDER — INSULIN ASPART 100 UNIT/ML ~~LOC~~ SOLN: 0.0000 [IU] | Freq: Every day | SUBCUTANEOUS | Status: DC

## 2020-05-19 MED ORDER — ROCURONIUM BROMIDE 10 MG/ML (PF) SYRINGE
PREFILLED_SYRINGE | INTRAVENOUS | Status: AC
  Filled 2020-05-19: qty 10

## 2020-05-19 MED ORDER — FENTANYL CITRATE (PF) 100 MCG/2ML IJ SOLN: 25.0000 ug | INTRAMUSCULAR | Status: DC | PRN

## 2020-05-19 MED ORDER — FENTANYL CITRATE (PF) 250 MCG/5ML IJ SOLN
INTRAMUSCULAR | Status: AC
  Filled 2020-05-19: qty 5

## 2020-05-19 MED ORDER — DEXAMETHASONE SODIUM PHOSPHATE 10 MG/ML IJ SOLN
INTRAMUSCULAR | Status: DC | PRN
  Administered 2020-05-19: 5 mg via INTRAVENOUS

## 2020-05-19 MED ORDER — ONDANSETRON HCL 4 MG/2ML IJ SOLN
4.0000 mg | Freq: Four times a day (QID) | INTRAMUSCULAR | Status: DC | PRN
  Filled 2020-05-19: qty 2

## 2020-05-19 MED ORDER — INSULIN ASPART 100 UNIT/ML ~~LOC~~ SOLN
0.0000 [IU] | Freq: Three times a day (TID) | SUBCUTANEOUS | Status: DC
  Administered 2020-05-20: 3 [IU] via SUBCUTANEOUS
  Administered 2020-05-20: 5 [IU] via SUBCUTANEOUS
  Administered 2020-05-20: 3 [IU] via SUBCUTANEOUS
  Administered 2020-05-21 (×2): 2 [IU] via SUBCUTANEOUS
  Administered 2020-05-21: 5 [IU] via SUBCUTANEOUS
  Administered 2020-05-22: 2 [IU] via SUBCUTANEOUS
  Administered 2020-05-22: 3 [IU] via SUBCUTANEOUS
  Administered 2020-05-23: 2 [IU] via SUBCUTANEOUS
  Administered 2020-05-24: 3 [IU] via SUBCUTANEOUS

## 2020-05-19 MED ORDER — BACITRACIN ZINC 500 UNIT/GM EX OINT
TOPICAL_OINTMENT | CUTANEOUS | Status: AC
  Filled 2020-05-19: qty 28.35

## 2020-05-19 MED ORDER — LACTATED RINGERS IV SOLN: INTRAVENOUS | Status: DC

## 2020-05-19 MED ORDER — MIDAZOLAM HCL 2 MG/2ML IJ SOLN
INTRAMUSCULAR | Status: AC
  Filled 2020-05-19: qty 2

## 2020-05-19 MED ORDER — PRAMIPEXOLE DIHYDROCHLORIDE 0.25 MG PO TABS
0.2500 mg | ORAL_TABLET | Freq: Two times a day (BID) | ORAL | Status: DC
  Administered 2020-05-19 – 2020-05-24 (×10): 0.25 mg via ORAL
  Filled 2020-05-19 (×11): qty 1

## 2020-05-19 MED ORDER — STERILE WATER FOR IRRIGATION IR SOLN
Status: DC | PRN
  Administered 2020-05-19: 1000 mL

## 2020-05-19 MED ORDER — ONDANSETRON HCL 4 MG PO TABS: 4.0000 mg | ORAL_TABLET | Freq: Four times a day (QID) | ORAL | Status: DC | PRN

## 2020-05-19 MED ORDER — LIDOCAINE 2% (20 MG/ML) 5 ML SYRINGE
INTRAMUSCULAR | Status: AC
  Filled 2020-05-19: qty 5

## 2020-05-19 MED ORDER — METOCLOPRAMIDE HCL 5 MG/ML IJ SOLN: 5.0000 mg | Freq: Three times a day (TID) | INTRAMUSCULAR | Status: DC | PRN

## 2020-05-19 MED ORDER — OMEPRAZOLE MAGNESIUM 20 MG PO TBEC: 20.0000 mg | DELAYED_RELEASE_TABLET | Freq: Every day | ORAL | Status: DC

## 2020-05-19 MED ORDER — VANCOMYCIN HCL 1000 MG IV SOLR
INTRAVENOUS | Status: AC
  Filled 2020-05-19: qty 1000

## 2020-05-19 MED ORDER — PHENYLEPHRINE 40 MCG/ML (10ML) SYRINGE FOR IV PUSH (FOR BLOOD PRESSURE SUPPORT)
PREFILLED_SYRINGE | INTRAVENOUS | Status: DC | PRN
  Administered 2020-05-19: 120 ug via INTRAVENOUS

## 2020-05-19 MED ORDER — INSULIN GLARGINE 100 UNIT/ML ~~LOC~~ SOLN
110.0000 [IU] | Freq: Every morning | SUBCUTANEOUS | Status: DC
  Filled 2020-05-19: qty 1.1

## 2020-05-19 MED ORDER — HYDRALAZINE HCL 50 MG PO TABS
100.0000 mg | ORAL_TABLET | Freq: Three times a day (TID) | ORAL | Status: DC
  Administered 2020-05-19 – 2020-05-21 (×4): 100 mg via ORAL
  Filled 2020-05-19 (×6): qty 2

## 2020-05-19 MED ORDER — ROSUVASTATIN CALCIUM 5 MG PO TABS
10.0000 mg | ORAL_TABLET | Freq: Every day | ORAL | Status: DC
  Administered 2020-05-20 – 2020-05-24 (×5): 10 mg via ORAL
  Filled 2020-05-19 (×5): qty 2

## 2020-05-19 MED ORDER — ROCURONIUM BROMIDE 10 MG/ML (PF) SYRINGE
PREFILLED_SYRINGE | INTRAVENOUS | Status: DC | PRN
  Administered 2020-05-19: 10 mg via INTRAVENOUS
  Administered 2020-05-19: 60 mg via INTRAVENOUS
  Administered 2020-05-19: 10 mg via INTRAVENOUS

## 2020-05-19 MED ORDER — PANTOPRAZOLE SODIUM 40 MG PO TBEC
40.0000 mg | DELAYED_RELEASE_TABLET | Freq: Every day | ORAL | Status: DC
  Administered 2020-05-20 – 2020-05-24 (×5): 40 mg via ORAL
  Filled 2020-05-19 (×5): qty 1

## 2020-05-19 MED ORDER — SODIUM CHLORIDE 0.9 % IV SOLN: INTRAVENOUS | Status: DC

## 2020-05-19 MED ORDER — DULOXETINE HCL 20 MG PO CPEP
20.0000 mg | ORAL_CAPSULE | Freq: Every day | ORAL | Status: DC
  Administered 2020-05-19 – 2020-05-23 (×5): 20 mg via ORAL
  Filled 2020-05-19 (×6): qty 1

## 2020-05-19 MED ORDER — 0.9 % SODIUM CHLORIDE (POUR BTL) OPTIME
TOPICAL | Status: DC | PRN
  Administered 2020-05-19: 1000 mL

## 2020-05-19 MED ORDER — TRAMADOL HCL 50 MG PO TABS
50.0000 mg | ORAL_TABLET | Freq: Two times a day (BID) | ORAL | Status: DC | PRN
  Administered 2020-05-20 (×2): 50 mg via ORAL
  Filled 2020-05-19 (×3): qty 1

## 2020-05-19 MED ORDER — LABETALOL HCL 200 MG PO TABS
200.0000 mg | ORAL_TABLET | Freq: Two times a day (BID) | ORAL | Status: DC
  Administered 2020-05-19 – 2020-05-24 (×9): 200 mg via ORAL
  Filled 2020-05-19 (×11): qty 1

## 2020-05-19 MED ORDER — FENTANYL CITRATE (PF) 250 MCG/5ML IJ SOLN
INTRAMUSCULAR | Status: DC | PRN
Start: 2020-05-19 — End: 2020-05-19
  Administered 2020-05-19 (×3): 50 ug via INTRAVENOUS
  Administered 2020-05-19: 150 ug via INTRAVENOUS

## 2020-05-19 MED ORDER — PROPOFOL 10 MG/ML IV BOLUS
INTRAVENOUS | Status: DC | PRN
  Administered 2020-05-19: 120 mg via INTRAVENOUS

## 2020-05-19 MED ORDER — ENOXAPARIN SODIUM 30 MG/0.3ML ~~LOC~~ SOLN
30.0000 mg | SUBCUTANEOUS | Status: DC
  Administered 2020-05-20 – 2020-05-24 (×5): 30 mg via SUBCUTANEOUS
  Filled 2020-05-19 (×5): qty 0.3

## 2020-05-19 MED ORDER — DOCUSATE SODIUM 100 MG PO CAPS
100.0000 mg | ORAL_CAPSULE | Freq: Two times a day (BID) | ORAL | Status: DC
  Administered 2020-05-19 – 2020-05-23 (×9): 100 mg via ORAL
  Filled 2020-05-19 (×10): qty 1

## 2020-05-19 MED ORDER — SUGAMMADEX SODIUM 200 MG/2ML IV SOLN
INTRAVENOUS | Status: DC | PRN
  Administered 2020-05-19: 200 mg via INTRAVENOUS

## 2020-05-19 MED ORDER — CEFAZOLIN SODIUM-DEXTROSE 1-4 GM/50ML-% IV SOLN
1.0000 g | Freq: Two times a day (BID) | INTRAVENOUS | Status: AC
  Administered 2020-05-20 (×2): 1 g via INTRAVENOUS
  Filled 2020-05-19 (×2): qty 50

## 2020-05-19 MED ORDER — ONDANSETRON HCL 4 MG/2ML IJ SOLN
INTRAMUSCULAR | Status: DC | PRN
  Administered 2020-05-19: 4 mg via INTRAVENOUS

## 2020-05-19 MED ORDER — CYCLOBENZAPRINE HCL 10 MG PO TABS
10.0000 mg | ORAL_TABLET | Freq: Three times a day (TID) | ORAL | Status: DC | PRN
  Administered 2020-05-19 – 2020-05-22 (×4): 10 mg via ORAL
  Filled 2020-05-19 (×4): qty 1

## 2020-05-19 MED ORDER — METOCLOPRAMIDE HCL 5 MG PO TABS: 5.0000 mg | ORAL_TABLET | Freq: Three times a day (TID) | ORAL | Status: DC | PRN

## 2020-05-19 MED ORDER — PROPOFOL 10 MG/ML IV BOLUS
INTRAVENOUS | Status: AC
  Filled 2020-05-19: qty 20

## 2020-05-19 MED ORDER — LABETALOL HCL 5 MG/ML IV SOLN
INTRAVENOUS | Status: DC | PRN
  Administered 2020-05-19 (×2): 5 mg via INTRAVENOUS

## 2020-05-19 MED ORDER — GABAPENTIN 300 MG PO CAPS
300.0000 mg | ORAL_CAPSULE | Freq: Every day | ORAL | Status: DC
  Administered 2020-05-19 – 2020-05-23 (×5): 300 mg via ORAL
  Filled 2020-05-19 (×5): qty 1

## 2020-05-19 MED ORDER — DOXYLAMINE SUCCINATE (SLEEP) 25 MG PO TABS
25.0000 mg | ORAL_TABLET | Freq: Every day | ORAL | Status: DC
  Administered 2020-05-20 – 2020-05-21 (×2): 25 mg via ORAL
  Filled 2020-05-19 (×6): qty 1

## 2020-05-19 MED ORDER — CEFAZOLIN SODIUM-DEXTROSE 2-4 GM/100ML-% IV SOLN
2.0000 g | INTRAVENOUS | Status: AC
  Administered 2020-05-19: 2 g via INTRAVENOUS

## 2020-05-19 SURGICAL SUPPLY — 54 items
BANDAGE ESMARK 6X9 LF (GAUZE/BANDAGES/DRESSINGS) ×1 IMPLANT
BIT DRILL CALIBRATED 4.2 (BIT) ×1 IMPLANT
BIT DRILL SHORT 4.2 (BIT) ×2 IMPLANT
BNDG ELASTIC 4X5.8 VLCR STR LF (GAUZE/BANDAGES/DRESSINGS) ×2 IMPLANT
BNDG ELASTIC 6X5.8 VLCR STR LF (GAUZE/BANDAGES/DRESSINGS) ×2 IMPLANT
BNDG ESMARK 6X9 LF (GAUZE/BANDAGES/DRESSINGS) ×2
BNDG GAUZE ELAST 4 BULKY (GAUZE/BANDAGES/DRESSINGS) ×6 IMPLANT
COVER MAYO STAND STRL (DRAPES) ×2 IMPLANT
COVER SURGICAL LIGHT HANDLE (MISCELLANEOUS) ×4 IMPLANT
CUFF TOURN SGL QUICK 34 (TOURNIQUET CUFF) ×1
CUFF TRNQT CYL 34X4.125X (TOURNIQUET CUFF) ×1 IMPLANT
DRAPE C-ARM 42X72 X-RAY (DRAPES) ×2 IMPLANT
DRAPE C-ARMOR (DRAPES) ×2 IMPLANT
DRAPE HALF SHEET 40X57 (DRAPES) ×2 IMPLANT
DRAPE IMP U-DRAPE 54X76 (DRAPES) ×2 IMPLANT
DRAPE POUCH INSTRU U-SHP 10X18 (DRAPES) ×2 IMPLANT
DRAPE U-SHAPE 47X51 STRL (DRAPES) ×2 IMPLANT
DRAPE UTILITY XL STRL (DRAPES) ×2 IMPLANT
DRILL BIT CALIBRATED 4.2 (BIT) ×2
DRILL BIT SHORT 4.2 (BIT) ×2
DRSG ADAPTIC 3X8 NADH LF (GAUZE/BANDAGES/DRESSINGS) ×2 IMPLANT
DRSG PAD ABDOMINAL 8X10 ST (GAUZE/BANDAGES/DRESSINGS) ×4 IMPLANT
DURAPREP 26ML APPLICATOR (WOUND CARE) ×2 IMPLANT
ELECT CAUTERY BLADE 6.4 (BLADE) ×2 IMPLANT
ELECT REM PT RETURN 9FT ADLT (ELECTROSURGICAL) ×2
ELECTRODE REM PT RTRN 9FT ADLT (ELECTROSURGICAL) ×1 IMPLANT
GAUZE SPONGE 4X4 12PLY STRL (GAUZE/BANDAGES/DRESSINGS) ×2 IMPLANT
GLOVE BIO SURGEON STRL SZ7.5 (GLOVE) ×2 IMPLANT
GLOVE BIOGEL PI IND STRL 8 (GLOVE) ×1 IMPLANT
GLOVE BIOGEL PI INDICATOR 8 (GLOVE) ×1
GOWN STRL REUS W/ TWL LRG LVL3 (GOWN DISPOSABLE) ×2 IMPLANT
GOWN STRL REUS W/ TWL XL LVL3 (GOWN DISPOSABLE) ×1 IMPLANT
GOWN STRL REUS W/TWL LRG LVL3 (GOWN DISPOSABLE) ×2
GOWN STRL REUS W/TWL XL LVL3 (GOWN DISPOSABLE) ×1
GUIDEWIRE 3.2X400 (WIRE) ×2 IMPLANT
KIT BASIN OR (CUSTOM PROCEDURE TRAY) ×2 IMPLANT
MANIFOLD NEPTUNE II (INSTRUMENTS) ×2 IMPLANT
NAIL TIBIAL TI 10X300MM (Nail) ×2 IMPLANT
NS IRRIG 1000ML POUR BTL (IV SOLUTION) ×2 IMPLANT
PACK TOTAL JOINT (CUSTOM PROCEDURE TRAY) ×2 IMPLANT
REAMER ROD DEEP FLUTE 2.5X950 (INSTRUMENTS) ×2 IMPLANT
SCREW LOCK STAR 5X32 (Screw) ×2 IMPLANT
SCREW LOCK STAR 5X38 (Screw) ×2 IMPLANT
SCREW LOCK STAR 5X42 (Screw) ×2 IMPLANT
SCREW LOCK STAR 5X60 (Screw) ×2 IMPLANT
SLEEVE 12.0MM DRILL PROTECTION (SLEEVE) ×2 IMPLANT
STAPLER SKIN PROX WIDE 3.9 (STAPLE) ×2 IMPLANT
SUT ETHILON 3 0 PS 1 (SUTURE) ×8 IMPLANT
SUT VIC AB 0 CT1 27 (SUTURE) ×1
SUT VIC AB 0 CT1 27XBRD ANBCTR (SUTURE) ×1 IMPLANT
SUT VIC AB 2-0 CT1 27 (SUTURE) ×2
SUT VIC AB 2-0 CT1 TAPERPNT 27 (SUTURE) ×2 IMPLANT
TOWEL GREEN STERILE (TOWEL DISPOSABLE) ×4 IMPLANT
WATER STERILE IRR 1000ML POUR (IV SOLUTION) ×2 IMPLANT

## 2020-05-19 NOTE — Consult Note (Signed)
Reason for Consult:Right tib/fib fx Referring Physician: [name] is an 79 y.o. female.  HPI: [name] was at home when she fell. She has a hx/o frequent falls and got her feet tangled in her shoes and the carpet. She had immediate right lower leg pain and knew she had broken it as it was floppy. She was brought to the ED at [facility] where x-rays showed a tib/fib fx and orthopedic surgery was consulted. It was requested for her to be transferred to [facility] for definitive care. She has no c/o other than the leg pain.  Past Medical History:  Diagnosis Date  . Anxiety   . Arthritis   . Cancer ([place])    colon  . Chronic kidney disease    stage 3  . Chronic pain syndrome   . Diabetes ([place])   . Diabetic neuropathy ([place])   . Dyslipidemia   . Early cataracts, bilateral   . Fibromyalgia   . GERD (gastroesophageal reflux disease)   . H/O syncope   . Heart murmur   . Hypertension   . Lumbar spinal stenosis    and scoliosis  . Pneumonia   . PONV (postoperative nausea and vomiting)   . Presence of permanent cardiac pacemaker   . Restless legs   . Spinal headache   . Thyroid nodule     Past Surgical History:  Procedure Laterality Date  . APPENDECTOMY    . BREAST SURGERY     lumpectomy  . COLON SURGERY    . DILATION AND CURETTAGE OF UTERUS    . EP IMPLANTABLE DEVICE N/A 07/21/2015   Procedure: Pacemaker Implant;  Surgeon: Deboraha Sprang, MD;  Location: [facility];  Service: Cardiovascular;  Laterality: N/A;  . LUMBAR LAMINECTOMY/DECOMPRESSION MICRODISCECTOMY N/A 02/02/2016   Procedure: DECOMPRESSION L4-L5 WITH INSITE 2 FUSION ;  Surgeon: Melina Schools, MD;  Location: [facility];  Service: Orthopedics;  Laterality: N/A;  . PARTIAL NEPHRECTOMY    . RIGHT/LEFT HEART CATH AND CORONARY ANGIOGRAPHY N/A 05/19/2018   Procedure: RIGHT/LEFT HEART CATH AND CORONARY ANGIOGRAPHY;  Surgeon: Jolaine Artist, MD;  Location: [facility];  Service: Cardiovascular;  Laterality: N/A;  .  TONSILLECTOMY    . TUBAL LIGATION      Family History  Problem Relation Age of Onset  . CAD Other   . Sick sinus syndrome Brother        has a PPM    Social History:  reports that she quit smoking about 36 years ago. She started smoking about 66 years ago. She has never used smokeless tobacco. She reports current alcohol use. She reports that she does not use drugs.  Allergies:  Allergies  Allergen Reactions  . Penicillins Diarrhea and Other (See Comments)    Severe diarrhea Has patient had a PCN reaction causing immediate rash, facial/tongue/throat swelling, SOB or lightheadedness with hypotension: No Has patient had a PCN reaction causing severe rash involving mucus membranes or skin necrosis: No Has patient had a PCN reaction that required hospitalization No Has patient had a PCN reaction occurring within the last 10 years: No If all of the above answers are "NO", then may proceed with Cephalosporin use.   . Codeine Other (See Comments)    Unknown  . Adhesive [Tape] Rash  . Aspirin Other (See Comments)    Stomach burning  . Iodine Itching, Swelling, Rash and Other (See Comments)    Can use if Benadryl and Prednisone are used first   .  Ivp Dye [Iodinated Diagnostic Agents] Other (See Comments)    Can use if Benadryl and Prednisone are used first    . Sulfa Antibiotics Diarrhea    Medications: I have reviewed the patient's current medications.  Results for orders placed or performed during the hospital encounter of 05/18/20 (from the past 48 hour(s))  Basic metabolic panel     Status: Abnormal   Collection Time: 05/18/20  4:46 PM  Result Value Ref Range   Sodium 139 135 - 145 mmol/L   Potassium 4.4 3.5 - 5.1 mmol/L   Chloride 103 98 - 111 mmol/L   CO2 26 22 - 32 mmol/L   Glucose, Bld 127 (H) 70 - 99 mg/dL    Comment: Glucose reference range applies only to samples taken after fasting for at least 8 hours.   BUN 54 (H) 8 - 23 mg/dL   Creatinine, Ser 2.10 (H) 0.44 -  1.00 mg/dL   Calcium 8.7 (L) 8.9 - 10.3 mg/dL   GFR calc non Af Amer 22 (L) >60 mL/min   GFR calc Af Amer 25 (L) >60 mL/min   Anion gap 10 5 - 15    Comment: Performed at [facility], [place] [address] [place], [place] [address]  CBC with Differential     Status: Abnormal   Collection Time: 05/18/20  4:46 PM  Result Value Ref Range   WBC 8.7 4.0 - 10.5 K/uL   RBC 4.13 3.87 - 5.11 MIL/uL   Hemoglobin 12.0 12.0 - 15.0 g/dL   HCT 38.7 36 - 46 %   MCV 93.7 80.0 - 100.0 fL   MCH 29.1 26.0 - 34.0 pg   MCHC 31.0 30.0 - 36.0 g/dL   RDW 13.5 11.5 - 15.5 %   Platelets 138 (L) 150 - 400 K/uL   nRBC 0.0 0.0 - 0.2 %   Neutrophils Relative % 78 %   Neutro Abs 6.9 1.7 - 7.7 K/uL   Lymphocytes Relative 12 %   Lymphs Abs 1.0 0.7 - 4.0 K/uL   Monocytes Relative 7 %   Monocytes Absolute 0.6 0 - 1 K/uL   Eosinophils Relative 1 %   Eosinophils Absolute 0.1 0 - 0 K/uL   Basophils Relative 1 %   Basophils Absolute 0.0 0 - 0 K/uL   Immature Granulocytes 1 %   Abs Immature Granulocytes 0.04 0.00 - 0.07 K/uL    Comment: Performed at [facility], [place] [address] [place], [place] [address]  SARS Coronavirus 2 by RT PCR (hospital order, performed in [facility] hospital lab)     Status: None   Collection Time: 05/18/20  5:33 PM  Result Value Ref Range   SARS Coronavirus 2 NEGATIVE NEGATIVE    Comment: (NOTE) SARS-CoV-2 target nucleic acids are NOT DETECTED.  The SARS-CoV-2 RNA is generally detectable in upper and lower respiratory specimens during the acute phase of infection. The lowest concentration of SARS-CoV-2 viral copies this assay can detect is 250 copies / mL. A negative result does not preclude SARS-CoV-2 infection and should not be used as the sole basis for treatment or other patient management decisions.  A negative result may occur with improper specimen collection / handling, submission of specimen other than nasopharyngeal swab, presence of viral  mutation(s) within the areas targeted by this assay, and inadequate number of viral copies (<250 copies / mL). A negative result must be combined with clinical observations, patient history, and epidemiological information.  Fact Sheet for Patients:  StrictlyIdeas.no  Fact Sheet for Healthcare Providers: BankingDealers.co.za  This test is not yet approved or  cleared by the [place] FDA and has been authorized for detection and/or diagnosis of SARS-CoV-2 by FDA under an Emergency Use Authorization (EUA).  This EUA will remain in effect (meaning this test can be used) for the duration of the COVID-19 declaration under Section 564(b)(1) of the Act, 21 U.S.C. section 360bbb-3(b)(1), unless the authorization is terminated or revoked sooner.  Performed at [facility], [place] [address], [place], [place] [address]   Surgical pcr screen     Status: None   Collection Time: 05/18/20 10:51 PM   Specimen: Nasal Mucosa; Nasal Swab  Result Value Ref Range   MRSA, PCR NEGATIVE NEGATIVE   Staphylococcus aureus NEGATIVE NEGATIVE    Comment: (NOTE) The Xpert SA Assay (FDA approved for NASAL specimens in patients 62 years of age and older), is one component of a comprehensive surveillance program. It is not intended to diagnose infection nor to guide or monitor treatment. Performed at [facility], [place] [address], [place], [place] [address]   Glucose, capillary     Status: Abnormal   Collection Time: 05/18/20 11:09 PM  Result Value Ref Range   Glucose-Capillary 270 (H) 70 - 99 mg/dL    Comment: Glucose reference range applies only to samples taken after fasting for at least 8 hours.    DG Tibia/Fibula Right  Result Date: 05/18/2020 CLINICAL DATA:  Recent fall with known distal tibial and fibular fractures EXAM: RIGHT TIBIA AND FIBULA - 2 VIEW COMPARISON:  Ankle films from earlier in the same day. FINDINGS: In  addition to the distal tibial and fibular fractures there is a proximal fibular shaft fracture just below the fibular neck. The proximal tibia appears within normal limits. No other focal abnormality is noted. IMPRESSION: Distal tibial fracture with evidence of proximal and distal fibular fractures. Electronically Signed   By: Inez Catalina M.D.   On: 05/18/2020 15:16   DG Ankle Complete Right  Result Date: 05/18/2020 CLINICAL DATA:  Recent trip and fall with ankle pain, initial encounter EXAM: RIGHT ANKLE - COMPLETE 3+ VIEW COMPARISON:  None. FINDINGS: Oblique fracture is noted through the distal tibial diaphysis as well as a comminuted fracture of the distal fibular diaphysis. Medial angulation of the distal fracture fragments is noted. Some slight posterior displacement is noted as well. Severe tarsal degenerative changes are noted in the foot although no fractures are seen in the foot. IMPRESSION: Tibial and fibular fractures as described with medial angulation distally. Electronically Signed   By: Inez Catalina M.D.   On: 05/18/2020 15:15    Review of Systems  HENT: Negative for ear discharge, ear pain, hearing loss and tinnitus.   Eyes: Negative for photophobia and pain.  Respiratory: Negative for cough and shortness of breath.   Cardiovascular: Negative for chest pain.  Gastrointestinal: Negative for abdominal pain, nausea and vomiting.  Genitourinary: Negative for dysuria, flank pain, frequency and urgency.  Musculoskeletal: Positive for arthralgias (Right lower leg). Negative for back pain, myalgias and neck pain.  Neurological: Negative for dizziness and headaches.  Hematological: Does not bruise/bleed easily.  Psychiatric/Behavioral: The patient is not nervous/anxious.    Blood pressure 133/79, pulse 80, temperature 98 F (36.7 C), temperature source Oral, resp. rate 17, height 5\' 3"  (1.6 m), weight 99.7 kg, SpO2 99 %. Physical Exam Constitutional:      General: She is not in acute  distress.  Appearance: She is well-developed. She is not diaphoretic.  HENT:     Head: Normocephalic and atraumatic.  Eyes:     General: No scleral icterus.       Right eye: No discharge.        Left eye: No discharge.     Conjunctiva/sclera: Conjunctivae normal.  Cardiovascular:     Rate and Rhythm: Normal rate and regular rhythm.  Pulmonary:     Effort: Pulmonary effort is normal. No respiratory distress.  Musculoskeletal:     Cervical back: Normal range of motion.     Comments: RLE No traumatic wounds, ecchymosis, or rash  Short leg splint in place  No knee effusion  Knee stable to varus/ valgus and anterior/posterior stress  Sens DPN, SPN, TN intact  Motor EHL 5/5  Toe perfused and ecchymotic, No significant edema  Skin:    General: Skin is warm and dry.  Neurological:     Mental Status: She is alert.  Psychiatric:        Behavior: Behavior normal.     Assessment/Plan: Right tib/fib fx -- Plan IMN today by Dr. Stann Mainland. Please keep NPO. Multiple medical problems including CKD, DM, HLD, GERD, chronic pain, anxiety, and HTN -- May need to get IM involved post-op as she should have been admitted by them I think.    Lisette Abu, PA-C Orthopedic Surgery [phone] 05/19/2020, 3:14 PM

## 2020-05-19 NOTE — Progress Notes (Signed)
Dr. Julious Payer 's PA called and states he is not involving with the patient. She will talked to Dr. Theresa Mulligan group to put in orders for her.

## 2020-05-19 NOTE — Anesthesia Preprocedure Evaluation (Signed)
Anesthesia Evaluation  Patient identified by MRN, date of birth, ID band Patient awake    Reviewed: Allergy & Precautions  History of Anesthesia Complications (+) PONV and POST - OP SPINAL HEADACHE  Airway Mallampati: II  TM Distance: >3 FB     Dental   Pulmonary pneumonia, former smoker,    breath sounds clear to auscultation       Cardiovascular hypertension, + pacemaker + Valvular Problems/Murmurs  Rhythm:Regular Rate:Normal     Neuro/Psych    GI/Hepatic GERD  ,  Endo/Other  diabetes  Renal/GU Renal disease     Musculoskeletal   Abdominal   Peds  Hematology   Anesthesia Other Findings   Reproductive/Obstetrics                             Anesthesia Physical Anesthesia Plan  ASA: III  Anesthesia Plan: General   Post-op Pain Management:    Induction: Intravenous  PONV Risk Score and Plan: 4 or greater and Ondansetron and Dexamethasone  Airway Management Planned: Oral ETT  Additional Equipment:   Intra-op Plan:   Post-operative Plan: Extubation in OR  Informed Consent: I have reviewed the patients History and Physical, chart, labs and discussed the procedure including the risks, benefits and alternatives for the proposed anesthesia with the patient or authorized representative who has indicated his/her understanding and acceptance.     Dental advisory given  Plan Discussed with: CRNA and Anesthesiologist  Anesthesia Plan Comments:         Anesthesia Quick Evaluation

## 2020-05-19 NOTE — Anesthesia Postprocedure Evaluation (Signed)
Anesthesia Post Note  Patient: [name]  Procedure(s) Performed: INTRAMEDULLARY (IM) NAIL TIBIAL (Right Leg Lower)     Patient location during evaluation: PACU Anesthesia Type: General Level of consciousness: awake and alert Pain management: pain level controlled Vital Signs Assessment: post-procedure vital signs reviewed and stable Respiratory status: spontaneous breathing, nonlabored ventilation, respiratory function stable and patient connected to nasal cannula oxygen Cardiovascular status: blood pressure returned to baseline and stable Postop Assessment: no apparent nausea or vomiting Anesthetic complications: no   No complications documented.  Last Vitals:  Vitals:   05/19/20 2146 05/19/20 2200  BP: (!) 156/67   Pulse: 70   Resp:    Temp: 36.9 C   SpO2: 90% 90%    Last Pain:  Vitals:   05/19/20 2200  TempSrc:   PainSc: 7                  Catalina Gravel

## 2020-05-19 NOTE — Brief Op Note (Signed)
05/19/2020  8:50 PM  PATIENT:  [name]  79 y.o. female  PRE-OPERATIVE DIAGNOSIS:   1. Right tibia fx 2. Right fibula fx  POST-OPERATIVE DIAGNOSIS:  same  PROCEDURE:  Procedure(s): INTRAMEDULLARY (IM) NAIL TIBIAL (Right)  SURGEON:  Surgeon(s) and Role:    * Nicholes Stairs, MD - Primary  PHYSICIAN ASSISTANT:  Jonelle Sidle, PA-C  ANESTHESIA:   general  EBL:  300 mL   BLOOD ADMINISTERED:none  DRAINS: none   LOCAL MEDICATIONS USED:  NONE  SPECIMEN:  No Specimen  DISPOSITION OF SPECIMEN:  N/A  COUNTS:  YES  TOURNIQUET:  * No tourniquets in log *  DICTATION: .Note written in EPIC  PLAN OF CARE: Admit to inpatient   PATIENT DISPOSITION:  PACU - hemodynamically stable.   Delay start of Pharmacological VTE agent (>24hrs) due to surgical blood loss or risk of bleeding: not applicable

## 2020-05-19 NOTE — Progress Notes (Signed)
Called attending doctor office for CBG order. Secretary states patient is not in the system, MD will check and call back.

## 2020-05-19 NOTE — Plan of Care (Addendum)
Called OTS, no answering service available for [name]. Called [facility], spoke with Dr. Grandville Silos about needing orders for patient because only orders in were pre-op, asked for the standard orders for her. Still no new orders placed will continue to monitor pt. Will handoff to day shift nurse to page [name]. Patient NPO since MN.    Problem: Education: Goal: Knowledge of General Education information will improve Description: Including pain rating scale, medication(s)/side effects and non-pharmacologic comfort measures Outcome: Progressing   Problem: Activity: Goal: Risk for activity intolerance will decrease Outcome: Progressing   Problem: Pain Managment: Goal: General experience of comfort will improve Outcome: Progressing   Problem: Safety: Goal: Ability to remain free from injury will improve Outcome: Progressing   Problem: Skin Integrity: Goal: Risk for impaired skin integrity will decrease Outcome: Progressing

## 2020-05-19 NOTE — Progress Notes (Signed)
PHARMACY NOTE:  ANTIMICROBIAL RENAL DOSAGE ADJUSTMENT  Current antimicrobial regimen includes a mismatch between antimicrobial dosage and estimated renal function.  As per policy approved by the Pharmacy & Therapeutics and Medical Executive Committees, the antimicrobial dosage will be adjusted accordingly.  Current antimicrobial dosage:  Cefazolin 1gm IV q6h x 3  Indication: surgical prophylaxis  Renal Function:  Estimated Creatinine Clearance: 23.6 mL/min (A) (by C-G formula based on SCr of 2.1 mg/dL (H)). []      On intermittent HD, scheduled: []      On CRRT    Antimicrobial dosage has been changed to:  Cefazolin 1gm IV q12h x 2  Additional comments:   Effie Janoski A. Levada Dy, PharmD, BCPS, FNKF Clinical Pharmacist [place] Please utilize Amion for appropriate phone number to reach the unit pharmacist ([place])   05/19/2020 9:44 PM

## 2020-05-19 NOTE — Consult Note (Signed)
Medical Consultation   Chaselynn Kepple  JAS:[id number]  DOB: Nov 04, 1940  DOA: 05/18/2020  PCP: Clinton Quant, MD   Requesting physician: Silvestre Gunner PA  Reason for consultation: Management of chronic medical issues   History of Present Illness: [name] is an 79 y.o. female is a very pleasant 79 year old female with past medical history of chronic diastolic CHF, type 2 diabetes mellitus, GERD, colon cancer, CKD stage III, symptomatic sinus node dysfunction status post pacemaker placement, hypertension, hyperlipidemia, anxiety, arthritis, chronic wound on left foot, morbid obesity presented to [facility] with fall and right leg pain.  Patient tells me that she was trying to reach for telephone when she tripped and twisted her ankle and fell on the ground on right leg.  Reports severe right leg pain, sharp, 10 out of 10 pain.  She was given fentanyl 200 mcg in route to ED by EMS.  She denies lightheadedness, dizziness, headache, blurry vision, loss of consciousness, head trauma, seizures.  She denies chest pain, shortness of breath, palpitation, leg swelling, fever, chills, nausea, vomiting, abdominal pain, urinary or bowel changes.  No history of smoking, alcohol, recent drug use.  She has chronic wound on left foot for which she is followed by wound care outpatient.  She tells me that her wound is getting better.  Upon arrival to ED: Patient's vital signs stable.  Afebrile with no leukocytosis, CBC shows platelet of 138, CMP shows worsening kidney function, COVID-19 negative, x-ray of right tibia and fibula shows distal tibial fracture with evidence of proximal and distal fibular fracture.  Orthopedic surgery was consulted.  Patient transferred from [facility] to [facility] for a scheduled procedure.  [facility] hospitalist consulted management of chronic medical issues.  Past Medical History:  Diagnosis Date  . Anxiety   . Arthritis   . Cancer ([place])    colon    . Chronic kidney disease    stage 3  . Chronic pain syndrome   . Diabetes ([place])   . Diabetic neuropathy ([place])   . Dyslipidemia   . Early cataracts, bilateral   . Fibromyalgia   . GERD (gastroesophageal reflux disease)   . H/O syncope   . Heart murmur   . Hypertension   . Lumbar spinal stenosis    and scoliosis  . Pneumonia   . PONV (postoperative nausea and vomiting)   . Presence of permanent cardiac pacemaker   . Restless legs   . Spinal headache   . Thyroid nodule          Review of Systems:  ROS As per HPI otherwise 10 point review of systems negative.     Past Medical History: Past Medical History:  Diagnosis Date  . Anxiety   . Arthritis   . Cancer ([place])    colon  . Chronic kidney disease    stage 3  . Chronic pain syndrome   . Diabetes ([place])   . Diabetic neuropathy ([place])   . Dyslipidemia   . Early cataracts, bilateral   . Fibromyalgia   . GERD (gastroesophageal reflux disease)   . H/O syncope   . Heart murmur   . Hypertension   . Lumbar spinal stenosis    and scoliosis  . Pneumonia   . PONV (postoperative nausea and vomiting)   . Presence of permanent cardiac pacemaker   . Restless legs   . Spinal headache   . Thyroid  nodule     Past Surgical History: Past Surgical History:  Procedure Laterality Date  . APPENDECTOMY    . BREAST SURGERY     lumpectomy  . COLON SURGERY    . DILATION AND CURETTAGE OF UTERUS    . EP IMPLANTABLE DEVICE N/A 07/21/2015   Procedure: Pacemaker Implant;  Surgeon: Deboraha Sprang, MD;  Location: [facility];  Service: Cardiovascular;  Laterality: N/A;  . LUMBAR LAMINECTOMY/DECOMPRESSION MICRODISCECTOMY N/A 02/02/2016   Procedure: DECOMPRESSION L4-L5 WITH INSITE 2 FUSION ;  Surgeon: Melina Schools, MD;  Location: [facility];  Service: Orthopedics;  Laterality: N/A;  . PARTIAL NEPHRECTOMY    . RIGHT/LEFT HEART CATH AND CORONARY ANGIOGRAPHY N/A 05/19/2018   Procedure: RIGHT/LEFT HEART CATH AND CORONARY ANGIOGRAPHY;   Surgeon: Jolaine Artist, MD;  Location: [facility];  Service: Cardiovascular;  Laterality: N/A;  . TONSILLECTOMY    . TUBAL LIGATION       Allergies:   Allergies  Allergen Reactions  . Penicillins Diarrhea and Other (See Comments)    Severe diarrhea Has patient had a PCN reaction causing immediate rash, facial/tongue/throat swelling, SOB or lightheadedness with hypotension: No Has patient had a PCN reaction causing severe rash involving mucus membranes or skin necrosis: No Has patient had a PCN reaction that required hospitalization No Has patient had a PCN reaction occurring within the last 10 years: No If all of the above answers are "NO", then may proceed with Cephalosporin use.   . Codeine Other (See Comments)    Unknown  . Adhesive [Tape] Rash  . Aspirin Other (See Comments)    Stomach burning  . Iodine Itching, Swelling, Rash and Other (See Comments)    Can use if Benadryl and Prednisone are used first   . Ivp Dye [Iodinated Diagnostic Agents] Other (See Comments)    Can use if Benadryl and Prednisone are used first    . Sulfa Antibiotics Diarrhea     Social History:  reports that she quit smoking about 36 years ago. She started smoking about 66 years ago. She has never used smokeless tobacco. She reports current alcohol use. She reports that she does not use drugs.   Family History: Family History  Problem Relation Age of Onset  . CAD Other   . Sick sinus syndrome Brother        has a PPM      Physical Exam: Vitals:   05/18/20 2027 05/19/20 0447 05/19/20 0747 05/19/20 1341  BP: (!) 151/64 (!) 144/107 138/86 133/79  Pulse: 75 71 75 80  Resp: 17 19 17 17   Temp: 97.7 F (36.5 C) 98 F (36.7 C) 98.2 F (36.8 C) 98 F (36.7 C)  TempSrc: Oral Oral Oral Oral  SpO2: 92% 92% 95% 99%  Weight: 99.7 kg     Height: 5\' 3"  (1.6 m)       Constitutional: Appearance,  Alert and awake, oriented x3, not in any acute distress. Eyes: PERLA, EOMI, irises  appear normal, anicteric sclera,  ENMT: external ears and nose appear normal, normal hearing or hard of hearing            Lips appears normal, oropharynx mucosa, tongue, posterior pharynx appear normal  Neck: neck appears normal, no masses, normal ROM, no thyromegaly, no JVD  CVS: S1-S2 clear, no murmur rubs or gallops, no LE edema, normal pedal pulses  Respiratory:  clear to auscultation bilaterally, no wheezing, rales or rhonchi. Respiratory effort normal. No accessory muscle use.  Abdomen: soft nontender, nondistended, normal bowel sounds, no hepatosplenomegaly, no hernias  Musculoskeletal: : Right leg: wrapped in dressing.  Able to wiggle her toes.  Distal pulses: Intact. Neuro: Cranial nerves II-XII intact, strength, sensation, reflexes Psych: judgement and insight appear normal, stable mood and affect, mental status Skin: no rashes or lesions or ulcers, no induration or nodules    Data reviewed:  I have personally reviewed following labs and imaging studies Labs:  CBC: Recent Labs  Lab 05/18/20 1646  WBC 8.7  NEUTROABS 6.9  HGB 12.0  HCT 38.7  MCV 93.7  PLT 138*    Basic Metabolic Panel: Recent Labs  Lab 05/18/20 1646  NA 139  K 4.4  CL 103  CO2 26  GLUCOSE 127*  BUN 54*  CREATININE 2.10*  CALCIUM 8.7*   GFR Estimated Creatinine Clearance: 23.6 mL/min (A) (by C-G formula based on SCr of 2.1 mg/dL (H)). Liver Function Tests: No results for input(s): AST, ALT, ALKPHOS, BILITOT, PROT, ALBUMIN in the last 168 hours. No results for input(s): LIPASE, AMYLASE in the last 168 hours. No results for input(s): AMMONIA in the last 168 hours. Coagulation profile No results for input(s): INR, PROTIME in the last 168 hours.  Cardiac Enzymes: No results for input(s): CKTOTAL, CKMB, CKMBINDEX, TROPONINI in the last 168 hours. BNP: Invalid input(s): POCBNP CBG: Recent Labs  Lab 05/18/20 2309  GLUCAP 270*   D-Dimer No results for input(s): DDIMER in the last 72  hours. Hgb A1c No results for input(s): HGBA1C in the last 72 hours. Lipid Profile No results for input(s): CHOL, HDL, LDLCALC, TRIG, CHOLHDL, LDLDIRECT in the last 72 hours. Thyroid function studies No results for input(s): TSH, T4TOTAL, T3FREE, THYROIDAB in the last 72 hours.  Invalid input(s): FREET3 Anemia work up No results for input(s): VITAMINB12, FOLATE, FERRITIN, TIBC, IRON, RETICCTPCT in the last 72 hours. Urinalysis No results found for: COLORURINE, APPEARANCEUR, [place], [place], [place], [place], [place], [place], [place], UROBILINOGEN, NITRITE, [place]   Microbiology Recent Results (from the past 240 hour(s))  SARS Coronavirus 2 by RT PCR (hospital order, performed in [facility] hospital lab)     Status: None   Collection Time: 05/18/20  5:33 PM  Result Value Ref Range Status   SARS Coronavirus 2 NEGATIVE NEGATIVE Final    Comment: (NOTE) SARS-CoV-2 target nucleic acids are NOT DETECTED.  The SARS-CoV-2 RNA is generally detectable in upper and lower respiratory specimens during the acute phase of infection. The lowest concentration of SARS-CoV-2 viral copies this assay can detect is 250 copies / mL. A negative result does not preclude SARS-CoV-2 infection and should not be used as the sole basis for treatment or other patient management decisions.  A negative result may occur with improper specimen collection / handling, submission of specimen other than nasopharyngeal swab, presence of viral mutation(s) within the areas targeted by this assay, and inadequate number of viral copies (<250 copies / mL). A negative result must be combined with clinical observations, patient history, and epidemiological information.  Fact Sheet for Patients:   StrictlyIdeas.no  Fact Sheet for Healthcare Providers: BankingDealers.co.za  This test is not yet approved or  cleared by the [place] FDA and has been  authorized for detection and/or diagnosis of SARS-CoV-2 by FDA under an Emergency Use Authorization (EUA).  This EUA will remain in effect (meaning this test can be used) for the duration of the COVID-19 declaration under Section 564(b)(1) of the Act, 21 U.S.C. section 360bbb-3(b)(1), unless the authorization is terminated or  revoked sooner.  Performed at [facility], [place] [address], [place], [place] [address]   Surgical pcr screen     Status: None   Collection Time: 05/18/20 10:51 PM   Specimen: Nasal Mucosa; Nasal Swab  Result Value Ref Range Status   MRSA, PCR NEGATIVE NEGATIVE Final   Staphylococcus aureus NEGATIVE NEGATIVE Final    Comment: (NOTE) The Xpert SA Assay (FDA approved for NASAL specimens in patients 79 years of age and older), is one component of a comprehensive surveillance program. It is not intended to diagnose infection nor to guide or monitor treatment. Performed at [facility], [place] [address] [place], [place] [address]        Inpatient Medications:   Scheduled Meds: . doxylamine (Sleep)  25 mg Oral QHS  . DULoxetine  20 mg Oral QHS  . gabapentin  300 mg Oral QHS  . hydrALAZINE  100 mg Oral TID  . [START ON 05/20/2020] insulin degludec  110 Units Subcutaneous q AM  . labetalol  200 mg Oral BID  . omeprazole  20 mg Oral Daily  . pramipexole  0.25 mg Oral BID  . rosuvastatin  10 mg Oral Daily   Continuous Infusions:   Radiological Exams on Admission: DG Tibia/Fibula Right  Result Date: 05/18/2020 CLINICAL DATA:  Recent fall with known distal tibial and fibular fractures EXAM: RIGHT TIBIA AND FIBULA - 2 VIEW COMPARISON:  Ankle films from earlier in the same day. FINDINGS: In addition to the distal tibial and fibular fractures there is a proximal fibular shaft fracture just below the fibular neck. The proximal tibia appears within normal limits. No other focal abnormality is noted. IMPRESSION: Distal tibial fracture with  evidence of proximal and distal fibular fractures. Electronically Signed   By: Inez Catalina M.D.   On: 05/18/2020 15:16   DG Ankle Complete Right  Result Date: 05/18/2020 CLINICAL DATA:  Recent trip and fall with ankle pain, initial encounter EXAM: RIGHT ANKLE - COMPLETE 3+ VIEW COMPARISON:  None. FINDINGS: Oblique fracture is noted through the distal tibial diaphysis as well as a comminuted fracture of the distal fibular diaphysis. Medial angulation of the distal fracture fragments is noted. Some slight posterior displacement is noted as well. Severe tarsal degenerative changes are noted in the foot although no fractures are seen in the foot. IMPRESSION: Tibial and fibular fractures as described with medial angulation distally. Electronically Signed   By: Inez Catalina M.D.   On: 05/18/2020 15:15    Impression/Recommendations Principal Problem:   Right tibial fracture Active Problems:   Fall   Hypertension   Diabetes ([place])   Dyslipidemia   GERD (gastroesophageal reflux disease)   Acute on chronic kidney failure (HCC)   Thrombocytopenia (HCC)   Right tibial fracture: Status post fall -Reviewed x-ray of right leg. -Patient is scheduled for intramedullary nailing today with Dr. Stann Mainland. -Patient is NPO. -Tylenol/tramadol/Dilaudid as needed for pain control.   -Monitor H&H closely.  AKI on CKD stage IIIb: -Creatinine: 2.10, GFR: 22 -Continue with gentle hydration.  Hold nephrotoxic medication.  Repeat BMP tomorrow a.m.  Chronic diastolic CHF: Patient appears euvolemic on exam. -BNP: WNL.  Reviewed echo from 05/14/2018 which showed ejection fraction of 50 to 55% -Hold torsemide due to worsening kidney function.  Strict INO's and daily weight.  Monitor signs of fluid overload.  Check electrolytes. -Resume torsemide once kidney function is back to baseline.  Hypertension: Stable -Continue hydralazine, labetalol, hold irbesartan due to worsening kidney function.  Monitor blood pressure  closely.  Sinus node dysfunction status post PPM: -Followed with cardiology outpatient.  Patient denies any symptoms today.  Hyperlipidemia: Continue statin  Type 2 diabetes mellitus: Check A1c -Hold Jardiance.  Started patient on sliding scale insulin, continue Tresiba 110 units daily.  Monitor blood sugar closely.  GERD: Continue PPI  Restless leg syndrome: Continue Mirapex and gabapentin  Depression/anxiety: Continue Cymbalta, Unisom for insomnia  Thrombocytopenia: Platelet of 138 -No signs of active bleeding.  Repeat CBC tomorrow a.m.  Obesity with BMI of 38: -Diet modification/exercise and weight loss recommended.  Thank you for this consultation.  Our [facility] hospitalist team will follow the patient with you.   Time Spent: 35 minutes  Mckinley Jewel M.D. [facility] 05/19/2020, 5:04 PM

## 2020-05-19 NOTE — TOC Initial Note (Addendum)
Transition of Care [facility]) - Initial/Assessment Note    Patient Details  Name: [name] MRN: [id number] Date of Birth: [birth year]  [facility] CM/SW Contact:    Sharin Mons, RN Phone Number: [phone] 05/19/2020, 12:23 PM  Clinical Narrative:                 Admitted s/p fall at home. Suffered closed fracture of right tibia and fibula. Resides alone. States lives @ [facility]. Supportive family...daughter lives 10 min away. PTA independent with ADL's, no DME usage.    [name] (Daughter)     [phone]      Plan: surgery,9/9 per ortho  [facility] team monitoring and following for needs .Marland Kitchen...   05/20/2020     - s/p  right tibia closed reduction and intramedullary nailing ; closed treatment of left fibular shaft fracture with manipulation, 9/9. PT/OT evals pending. TOC team will continue to monitor and follow   Expected Discharge Plan: [place]     Patient Goals and CMS Choice Patient states their goals for this hospitalization and ongoing recovery are:: HOPING TO GET LEG FIXED AND TO WALK AGAIN      Expected Discharge Plan and Services Expected Discharge Plan: [place]   Discharge Planning Services: CM Consult                                          Prior Living Arrangements/Services   Lives with:: Self Patient language and need for interpreter reviewed:: Yes                 Activities of Daily Living Home Assistive Devices/Equipment: Cane (specify quad or straight), Eyeglasses, CBG Meter (single point cane) ADL Screening (condition at time of admission) Patient's cognitive ability adequate to safely complete daily activities?: Yes Is the patient deaf or have difficulty hearing?: No Does the patient have difficulty seeing, even when wearing glasses/contacts?: No Does the patient have difficulty concentrating, remembering, or making decisions?: No Patient able to express need for  assistance with ADLs?: Yes Does the patient have difficulty dressing or bathing?: Yes Independently performs ADLs?: No Communication: Independent Dressing (OT): Needs assistance Is this a change from baseline?: Change from baseline, expected to last >3 days Grooming: Needs assistance Is this a change from baseline?: Change from baseline, expected to last >3 days Feeding: Needs assistance Is this a change from baseline?: Change from baseline, expected to last >3 days Bathing: Needs assistance Is this a change from baseline?: Change from baseline, expected to last >3 days Toileting: Needs assistance Is this a change from baseline?: Change from baseline, expected to last >3days In/Out Bed: Needs assistance Is this a change from baseline?: Change from baseline, expected to last >3 days Walks in Home: Needs assistance Is this a change from baseline?: Change from baseline, expected to last >3 days Does the patient have difficulty walking or climbing stairs?: Yes (secondary broken ankle) Weakness of Legs: Right Weakness of Arms/Hands: None  Permission Sought/Granted   Permission granted to share information with : Yes, Verbal Permission Granted              Emotional Assessment Appearance:: Appears stated age Attitude/Demeanor/Rapport: Engaged Affect (typically observed): Accepting Orientation: : Oriented to Self, Oriented to Place, Oriented to  Time, Oriented to Situation Alcohol / Substance Use: Not Applicable Psych Involvement: No (comment)  Admission diagnosis:  Fall [W19.XXXA] Fall, initial encounter [id number]] Closed fracture of right tibia and fibula, initial encounter [S82.201A, S82.401A] Patient Active Problem List   Diagnosis Date Noted  . Right tibial fracture 05/19/2020  . Fall 05/18/2020  . Elevated serum creatinine 04/09/2018  . Spinal stenosis at L4-L5 level 02/02/2016  . Cardiac device in situ   . Sinus node dysfunction ([place]) 07/21/2015  . Bradycardia  05/10/2015   PCP:  Clinton Quant, MD Pharmacy:   [facility], [address] [place] [place] [address] Phone: [phone] Fax: [facility] [place], [address] [place] [address] Phone: [phone] Fax: [phone]  [id number] [place], [address] AT [address] [place] [address] Phone: [phone] Fax: [phone]     Social Determinants of Health ([place]) Interventions    Readmission Risk Interventions No flowsheet data found.

## 2020-05-19 NOTE — Op Note (Signed)
Date of Surgery: 05/19/2020  INDICATIONS: [name] is a 79 y.o.-year-old female who was involved in a ground-level fall and sustained a right tibia fracture.  She has a history of multiple falls recently.  She states that she has difficulty with her bilateral ankles following some nonoperatively managed fractures in the past.  She has decreased ability to tolerate any real ambulation.  The risks and benefits of the procedure discussed with the patient prior to the procedure and all questions were answered; consent was obtained.  PREOPERATIVE DIAGNOSIS:  1.  Right tibia fracture, closed 2.  Right segmental fibula fracture, closed  POSTOPERATIVE DIAGNOSIS: Same  PROCEDURE:   1. right tibia closed reduction and intramedullary nailing CPT: 41937  2. closed treatment of left fibular shaft fracture with manipulation, CPT - 90240  SURGEON: Geralynn Rile, M.D.  ASSISTANT:  Jonelle Sidle, PA-C.Marland Kitchen  Attestation: PA Mcclung was utilized throughout the procedure to aid in positioning the patient, reduction of fracture, positioning of retractors, as well as implantation of final hardware.  He was also utilized for closure and positioning from the operative theater to the stretcher and to the PACU.  He was scrubbed for the entire procedure.  ANESTHESIA:  general  IV FLUIDS AND URINE: See anesthesia record.  ESTIMATED BLOOD LOSS: 150 mL.  IMPLANTS:  Synthes tibial nail 10 mm x 300 mm 2 proximal 5.0 millimeter screws 2 distal interlock screws 5.0 mm   DRAINS: None.  COMPLICATIONS: None.  Tourniquet: None  DESCRIPTION OF PROCEDURE: The patient was brought to the operating room and placed supine on the operating table.  The patient's leg had been signed prior to the Procedure, by myself in the preoperative holding area.  The patient had the anesthesia placed by the anesthesiologist.  The prep verification and incision time-outs were performed to confirm that this was the correct patient,  site, side and location. The patient had an SCD on the opposite lower extremity. The patient did receive antibiotics prior to the incision and was re-dosed during the procedure as needed at indicated intervals.   The patient had the lower extremity prepped and draped in the standard surgical fashion.  The incision was first made over the quadriceps tendon in the midline and taken down to the skin and subcutaneous tissue to expose the peritenon. The peritenon was incised in line with the skin incision and then a poke hole was made in the quadriceps tendon in the midline.  A knife was then used to longitudinally divide the tendon in line with its fibers, taking care not to cross over any fibers. Then the guide wire was placed, utilizing the suprapatellar approach and sleeve instrumentation, at the proximal, anterior tibia, confirming its location on both AP and lateral views. The wire was drilled into the bone and then the opening reamer was placed over this and maneuvered so that the reamer was parallel with anterior cortex of the tibia. The ball-tipped guide wire was then placed down into the canal towards the fracture site.   We first began with close reduction maneuvers.  We spent approximately half an hour working to reduce this in a closed fashion.  We were unable to achieve acceptable alignment on both anterior and posterior views as well as lateral view.  We then elected to perform a small open incision along the anterior medial tibia.  Just medial to the tibial crest we performed a longitudinal incision of about 5 cm.  We dissected down to the level of the  fracture.  We used a key elevator to access the lateral cortex.  Of note there was interposed muscle into the lateral fracture fragment.  This was removed bluntly.  We then were able to put 2 pointed clamps on the fracture and achieve anatomic alignment on both AP and lateral projections.  The fracture was reduced, care was taken to confirm reduction on  both AP and lateral view, and the wire was passed and confirmed to be in the proper location on both AP and lateral views.  The measuring stick was used to measure the length of the nail.  Sequential reaming was then performed, initial chatter was heard with the 8 mm opening reamer, and we sequentially reamed up to a 11 mm reamer to accept a 10 mm nail.  Then the nail was gently hammered into place over the guide wire and the guide wire was removed. The proximal screws were placed through the interlocking drill guide using the sleeve. The distal screws were placed using the perfect circles technique. All screws were placed in the standard fashion, first incising the skin and then spreading with a tonsil, then drilling, measuring with a depth gauge, and then placing the screws by hand.  The final x-rays were taken in both AP and lateral views to confirm the fracture reduction as well as the placement of all hardware.   The fibula fracture was treated in a closed manner.    The wounds were copiously irrigated with saline and then the peritenon of the quadriceps tendon was closed with 0 Vicryl figure-of-eight interrupted sutures. 2.0 Vicryl was used to close the subcutaneous layer.  3-0 nylon were then used to close all of the open incision wounds.  The wounds were cleaned and dried a final time and a sterile dressing was placed.   A standard sterile compression dressing was applied.  The patient's calf was soft to palpation at the end of the case.  There was no compartment syndrome.  The patient was then transferred to a bed and taken to the recovery room in stable condition.  All counts were correct at the end of the case.   POSTOPERATIVE PLAN: [name] will be 50% weightbearing and will return 2 for suture removal.  [name] will receive DVT prophylaxis.  She will be admitted to the orthopedics floor for postoperative PT and OT.  She would likely need a extended recovery at a skilled nursing  facility.   Victorino December, MD [phone] [facility], [place]

## 2020-05-19 NOTE — Anesthesia Procedure Notes (Signed)
Procedure Name: Intubation Date/Time: 05/19/2020 5:57 PM Performed by: Janace Litten, CRNA Pre-anesthesia Checklist: Patient identified, Emergency Drugs available, Suction available and Patient being monitored Patient Re-evaluated:Patient Re-evaluated prior to induction Oxygen Delivery Method: Circle System Utilized Preoxygenation: Pre-oxygenation with 100% oxygen Induction Type: IV induction Ventilation: Mask ventilation without difficulty Laryngoscope Size: Mac and 3 Grade View: Grade I Tube type: Oral Tube size: 7.5 mm Number of attempts: 1 Airway Equipment and Method: Stylet Placement Confirmation: ETT inserted through vocal cords under direct vision,  positive ETCO2 and breath sounds checked- equal and bilateral Secured at: 21 cm Tube secured with: Tape Dental Injury: Teeth and Oropharynx as per pre-operative assessment

## 2020-05-19 NOTE — Plan of Care (Signed)

## 2020-05-19 NOTE — Transfer of Care (Signed)
Immediate Anesthesia Transfer of Care Note  Patient: [name]  Procedure(s) Performed: INTRAMEDULLARY (IM) NAIL TIBIAL (Right Leg Lower)  Patient Location: PACU  Anesthesia Type:General  Level of Consciousness: drowsy and patient cooperative  Airway & Oxygen Therapy: Patient Spontanous Breathing and Patient connected to face mask oxygen  Post-op Assessment: Report given to RN and Post -op Vital signs reviewed and stable  Post vital signs: Reviewed and stable  Last Vitals:  Vitals Value Taken Time  BP 176/59 05/19/20 2042  Temp    Pulse 74 05/19/20 2046  Resp 19 05/19/20 2046  SpO2 94 % 05/19/20 2046  Vitals shown include unvalidated device data.  Last Pain:  Vitals:   05/19/20 1651  TempSrc:   PainSc: 0-No pain      Patients Stated Pain Goal: 1 (28/24/17 5301)  Complications: No complications documented.

## 2020-05-20 ENCOUNTER — Encounter (HOSPITAL_COMMUNITY): Payer: Self-pay | Admitting: Orthopedic Surgery

## 2020-05-20 LAB — GLUCOSE, CAPILLARY
Glucose-Capillary: 183 mg/dL — ABNORMAL HIGH (ref 70–99)
Glucose-Capillary: 236 mg/dL — ABNORMAL HIGH (ref 70–99)
Glucose-Capillary: 161 mg/dL — ABNORMAL HIGH (ref 70–99)
Glucose-Capillary: 188 mg/dL — ABNORMAL HIGH (ref 70–99)

## 2020-05-20 LAB — CBC WITH DIFFERENTIAL/PLATELET
Abs Immature Granulocytes: 0.04 10*3/uL (ref 0.00–0.07)
Basophils Absolute: 0 10*3/uL (ref 0.0–0.1)
Basophils Relative: 0 %
Eosinophils Absolute: 0 10*3/uL (ref 0.0–0.5)
Eosinophils Relative: 0 %
HCT: 37.4 % (ref 36.0–46.0)
Hemoglobin: 11.2 g/dL — ABNORMAL LOW (ref 12.0–15.0)
Immature Granulocytes: 1 %
Lymphocytes Relative: 8 %
Lymphs Abs: 0.6 10*3/uL — ABNORMAL LOW (ref 0.7–4.0)
MCH: 27.9 pg (ref 26.0–34.0)
MCHC: 29.9 g/dL — ABNORMAL LOW (ref 30.0–36.0)
MCV: 93.3 fL (ref 80.0–100.0)
Monocytes Absolute: 0.4 10*3/uL (ref 0.1–1.0)
Monocytes Relative: 6 %
Neutro Abs: 6.3 10*3/uL (ref 1.7–7.7)
Neutrophils Relative %: 85 %
Platelets: 134 10*3/uL — ABNORMAL LOW (ref 150–400)
RBC: 4.01 MIL/uL (ref 3.87–5.11)
RDW: 13.4 % (ref 11.5–15.5)
WBC: 7.3 10*3/uL (ref 4.0–10.5)
nRBC: 0 % (ref 0.0–0.2)

## 2020-05-20 LAB — BASIC METABOLIC PANEL
Anion gap: 8 (ref 5–15)
BUN: 33 mg/dL — ABNORMAL HIGH (ref 8–23)
CO2: 26 mmol/L (ref 22–32)
Calcium: 8.6 mg/dL — ABNORMAL LOW (ref 8.9–10.3)
Chloride: 106 mmol/L (ref 98–111)
Creatinine, Ser: 1.5 mg/dL — ABNORMAL HIGH (ref 0.44–1.00)
GFR calc Af Amer: 37 mL/min — ABNORMAL LOW (ref >60)
GFR calc non Af Amer: 32 mL/min — ABNORMAL LOW (ref >60)
Glucose, Bld: 212 mg/dL — ABNORMAL HIGH (ref 70–99)
Potassium: 5.3 mmol/L — ABNORMAL HIGH (ref 3.5–5.1)
Sodium: 140 mmol/L (ref 135–145)

## 2020-05-20 LAB — HEMOGLOBIN A1C
Hgb A1c MFr Bld: 6.7 % — ABNORMAL HIGH (ref 4.8–5.6)
Mean Plasma Glucose: 145.59 mg/dL

## 2020-05-20 LAB — MAGNESIUM: Magnesium: 2.1 mg/dL (ref 1.7–2.4)

## 2020-05-20 MED ORDER — INSULIN GLARGINE 100 UNIT/ML ~~LOC~~ SOLN
50.0000 [IU] | Freq: Every day | SUBCUTANEOUS | Status: DC
  Administered 2020-05-20 – 2020-05-24 (×5): 50 [IU] via SUBCUTANEOUS
  Filled 2020-05-20 (×5): qty 0.5

## 2020-05-20 MED ORDER — TORSEMIDE 20 MG PO TABS
20.0000 mg | ORAL_TABLET | Freq: Every day | ORAL | Status: DC
  Administered 2020-05-21: 20 mg via ORAL
  Filled 2020-05-20 (×2): qty 1

## 2020-05-20 MED ORDER — SODIUM ZIRCONIUM CYCLOSILICATE 5 G PO PACK
5.0000 g | PACK | Freq: Once | ORAL | Status: AC
  Administered 2020-05-20: 5 g via ORAL
  Filled 2020-05-20: qty 1

## 2020-05-20 NOTE — Plan of Care (Signed)
  Problem: Pain Managment: Goal: General experience of comfort will improve Outcome: Progressing   Problem: Safety: Goal: Ability to remain free from injury will improve Outcome: Progressing   Problem: Skin Integrity: Goal: Risk for impaired skin integrity will decrease Outcome: Progressing   

## 2020-05-20 NOTE — Evaluation (Signed)
Physical Therapy Evaluation Patient Details Name: [name] MRN: [id number] DOB: [birth year] Today's Date: 05/20/2020   History of Present Illness  79 y.o female presenting after a fall resulting in R tib/fib fx. Pt is now s/p IM tibial nail. PMH includes HTN, DM2, GERD, AKI  Clinical Impression  Pt admitted secondary to problem above with deficits below. Pt required mod A to sit at EOB this session. Upon sitting, HR ranging from upper 130s up to 155 bpm, so did not attempt further transfers. Upon return to supine, pt HR remained the same. RN notified and aware. Feel pt would benefit from SNF level therapies to increase independence and safety prior to return home. Will continue to follow acutely to maximize functional mobility independence and safety.     Follow Up Recommendations SNF;Supervision/Assistance - 24 hour    Equipment Recommendations  Other (comment) (TBD)    Recommendations for Other Services       Precautions / Restrictions Precautions Precautions: Fall Required Braces or Orthoses: Other Brace Other Brace: CAM boot RLE Restrictions Weight Bearing Restrictions: Yes RLE Weight Bearing: Partial weight bearing RLE Partial Weight Bearing Percentage or Pounds: 50% with CAM boot      Mobility  Bed Mobility Overal bed mobility: Needs Assistance Bed Mobility: Supine to Sit;Sit to Supine     Supine to sit: Mod assist Sit to supine: Min assist   General bed mobility comments: Mod A for trunk elevation and assist with RLE. Pt with elevated HR (up to 155 bpm) in sitting, therefore further mobility deferred. Was able to laterally scoot for repositioning. RN notified. Min A for RLE assist.   Transfers                    Ambulation/Gait                Stairs            Wheelchair Mobility    Modified Rankin (Stroke Patients Only)       Balance Overall balance assessment: Needs assistance Sitting-balance support: No upper extremity  supported;Feet supported Sitting balance-Leahy Scale: Fair                                       Pertinent Vitals/Pain Pain Assessment: Faces Faces Pain Scale: Hurts little more Pain Location: R ankle Pain Descriptors / Indicators: Aching;Sore Pain Intervention(s): Limited activity within patient's tolerance;Monitored during session;Repositioned    Home Living Family/patient expects to be discharged to:: Private residence Living Arrangements: Alone Available Help at Discharge: Family;Available PRN/intermittently Type of Home: House Home Access: Stairs to enter Entrance Stairs-Rails: None Entrance Stairs-Number of Steps: 1 Home Layout: One level Home Equipment: Cane - single point;Shower seat      Prior Function Level of Independence: Independent with assistive device(s)         Comments: hx of frequent falls; occasional cane usage     Hand Dominance        Extremity/Trunk Assessment   Upper Extremity Assessment Upper Extremity Assessment: Defer to OT evaluation    Lower Extremity Assessment Lower Extremity Assessment: RLE deficits/detail RLE Deficits / Details: Deficits consistent with post op pain and weakness.        Communication   Communication: No difficulties  Cognition Arousal/Alertness: Awake/alert Behavior During Therapy: WFL for tasks assessed/performed Overall Cognitive Status: Within Functional Limits for tasks assessed  General Comments General comments (skin integrity, edema, etc.): Pt's daughter present in room.     Exercises     Assessment/Plan    PT Assessment Patient needs continued PT services  PT Problem List Decreased strength;Decreased balance;Decreased mobility;Decreased activity tolerance;Decreased knowledge of use of DME;Decreased knowledge of precautions;Pain       PT Treatment Interventions DME instruction;Gait training;Functional mobility  training;Therapeutic activities;Therapeutic exercise;Balance training;Patient/family education    PT Goals (Current goals can be found in the Care Plan section)  Acute Rehab PT Goals Patient Stated Goal: to regain independence PT Goal Formulation: With patient Time For Goal Achievement: 06/03/20 Potential to Achieve Goals: Good    Frequency Min 3X/week   Barriers to discharge Decreased caregiver support      Co-evaluation               AM-PAC PT "6 Clicks" Mobility  Outcome Measure Help needed turning from your back to your side while in a flat bed without using bedrails?: A Little Help needed moving from lying on your back to sitting on the side of a flat bed without using bedrails?: A Lot Help needed moving to and from a bed to a chair (including a wheelchair)?: A Lot Help needed standing up from a chair using your arms (e.g., wheelchair or bedside chair)?: A Lot Help needed to walk in hospital room?: A Lot Help needed climbing 3-5 steps with a railing? : Total 6 Click Score: 12    End of Session   Activity Tolerance: Treatment limited secondary to medical complications (Comment) (elevated HR) Patient left: in bed;with call bell/phone within reach;with bed alarm set;with family/visitor present Nurse Communication: Mobility status;Other (comment) (elevated HR ) PT Visit Diagnosis: Unsteadiness on feet (R26.81);Muscle weakness (generalized) (M62.81);History of falling (Z91.81)    Time: 1136-1150 PT Time Calculation (min) (ACUTE ONLY): 14 min   Charges:   PT Evaluation $PT Eval Moderate Complexity: 1 Mod          Reuel Derby, PT, DPT  Acute Rehabilitation Services  Pager: [phone] Office: [phone]   Rudean Hitt 05/20/2020, 3:03 PM

## 2020-05-20 NOTE — TOC CAGE-AID Note (Signed)
Transition of Care [facility]) - CAGE-AID Screening   Patient Details  Name: [name] MRN: [id number] Date of Birth: 06/12/1941  [facility]) CM/SW Contact:    Emeterio Reeve, [place] Phone Number: 05/20/2020, 2:16 PM   Clinical Narrative:  CSW met with pt at bedside. CSW introduced self and explained her role at the hospital.  PT denies alcohol use and substance use. Pt did not need any resources at this time.    CAGE-AID Screening:    Have You Ever Felt You Ought to Cut Down on Your Drinking or Drug Use?: No Have People Annoyed You By Critizing Your Drinking Or Drug Use?: No Have You Felt Bad Or Guilty About Your Drinking Or Drug Use?: No Have You Ever Had a Drink or Used Drugs First Thing In The Morning to Steady Your Nerves or to Get Rid of a Hangover?: No CAGE-AID Score: 0  Substance Abuse Education Offered: Yes  Substance abuse interventions: Patient Counseling   Emeterio Reeve, Latanya Presser, [place] Social Worker [phone]

## 2020-05-20 NOTE — Progress Notes (Addendum)
Subjective:  Patient reports pain as mild. Overall, she reports she is doing well.   Patient reports some throbbing in the ankle and discomfort when getting up with therapy. She reports she feels that the boot is heavy and rubs a little on her ankle when using it. She denies numbness or tingling. She denies fever or chills. She reports her pain is well controlled. She reports pain with her partial weightbearing and significant movement of the knee. She is 50% weightbearing in a CAM boot. She has voided since surgery.   Patient is a 79 year old female 1 day s/p right Tibial shaft fracture IM nail by Dr. Stann Mainland on 05/19/2020.    She is being followed by the hospitalist team for medical management of multiple concerns including  CKD, Chronic CHF, T2DM, symptomatic sinus node dysfunction, and hypertension. We appreciate their help with management of patient's chronic medical issues.   05/19/2020 PREOPERATIVE DIAGNOSIS:  1.  Right tibia fracture, closed 2.  Right segmental fibula fracture, closed   POSTOPERATIVE DIAGNOSIS: Same   PROCEDURE:   1. right tibia closed reduction and intramedullary nailing CPT: 96283  2. closed treatment of left fibular shaft fracture with manipulation, CPT - 66294   SURGEON: Geralynn Rile, M.D.   Objective:   VITALS:   Vitals:   05/19/20 2146 05/19/20 2200 05/20/20 0300 05/20/20 0740  BP: (!) 156/67  (!) 140/52 (!) 128/45  Pulse: 70  82 69  Resp:    16  Temp: 98.4 F (36.9 C)  98.2 F (36.8 C) (!) 97.4 F (36.3 C)  TempSrc: Oral  Oral Oral  SpO2: 90% 90% 98% 96%  Weight:      Height:       Right lower extremity:  Known ankle deformity at baseline.  Incision: dressing C/D/I except for small amount of bleeding at he proximal thigh incision. Soft dressing at the lower leg.  Capillary refill less than 2 seconds. Dorsalis pedis pulse 2+. Decreased ankle ROM but intact plantar and dorsiflexion. EHL 5/5. Able to wiggle her toes. Knee ROM limited by pain.  Able to passively flex right knee 5-60.  CAM Boot Present in the room.   Lab Results  Component Value Date   WBC 7.3 05/20/2020   HGB 11.2 (L) 05/20/2020   HCT 37.4 05/20/2020   MCV 93.3 05/20/2020   PLT 134 (L) 05/20/2020   BMET    Component Value Date/Time   NA 140 05/20/2020 0501   NA 139 07/29/2019 1051   K 5.3 (H) 05/20/2020 0501   CL 106 05/20/2020 0501   CO2 26 05/20/2020 0501   GLUCOSE 212 (H) 05/20/2020 0501   BUN 33 (H) 05/20/2020 0501   BUN 67 (H) 07/29/2019 1051   CREATININE 1.50 (H) 05/20/2020 0501   CALCIUM 8.6 (L) 05/20/2020 0501   GFRNONAA 32 (L) 05/20/2020 0501   GFRAA 37 (L) 05/20/2020 0501     Assessment/Plan: 1 Day Post-Op   Principal Problem:   Right tibial fracture Active Problems:   Fall   Hypertension   Diabetes ([place])   Dyslipidemia   GERD (gastroesophageal reflux disease)   Acute on chronic kidney failure ([facility])   Thrombocytopenia ([facility])   Closed right tibial fracture   Up with therapy Patient is doing as expected s/p right tibial nail on 05/19/2020. She will work with PT/OT with expected discharge to SNF when ready for recovery. She will be weightbearing 50% on the right lower extremity in the CAM boot. She should  maintain CAM boot when out of the bed. She can take the boot off while resting in the bed to prevent ulcers at the ankle due to previous ankle deformity. She will maintain her dressing for compression and wound coverage. Dressing reinforced proximally due to small amount of bleeding through the bandage at her proximal incision.   We will likely change her dressing prior to discharge and upon discharge we will plan for dry dressing changes daily after that likely with discharge to SNF in approximately 3 days if stable,  Return in 2 weeks at [facility] for suture removal, x-rays,  and wound examination.   Weightbearing Status: 50% weightbearing of the right lower extremity in CAM Boot DVT Prophylaxis: Lovenox SQ 30mg  per day, will  plan to Discharge on eliquis after consultation with hospitalist.   Faythe Casa 05/20/2020, 8:52 AM  Jonelle Sidle PA-C  Physician Assistant with Dr. Rogers, [place]

## 2020-05-20 NOTE — Progress Notes (Signed)
   05/20/20 1045  Assess: MEWS Score  Temp 98.8 F (37.1 C)  BP (!) 124/108  Pulse Rate (!) 116  Resp 18  Level of Consciousness Alert  SpO2 94 %  O2 Device Room Air  Assess: MEWS Score  MEWS Temp 0  MEWS Systolic 0  MEWS Pulse 2  MEWS RR 0  MEWS LOC 0  MEWS Score 2  MEWS Score Color Yellow  Assess: if the MEWS score is Yellow or Red  Were vital signs taken at a resting state? Yes  Focused Assessment Change from prior assessment (see assessment flowsheet)  Early Detection of Sepsis Score *See Row Information* Medium  MEWS guidelines implemented *See Row Information* Yes  Treat  MEWS Interventions Administered scheduled meds/treatments  Pain Scale 0-10  Pain Score 0  Escalate  MEWS: Escalate Yellow: discuss with charge nurse/RN and consider discussing with provider and RRT  Notify: Charge Nurse/RN  Name of Charge Nurse/RN Notified cynthia  Date Charge Nurse/RN Notified 05/20/20  Time Charge Nurse/RN Notified 1050  Notify: Provider  Provider Name/Title Vann  Date Provider Notified 05/20/20  Time Provider Notified 1050  Notification Type Page  Notification Reason Change in status  Notify: Rapid Response  Name of Rapid Response RN Notified N/a    Pt triggered yellow MEWs for  HR of 116 and a BP of 124/108. CN and MD notified, will continue to monitor and watch for new orders.

## 2020-05-20 NOTE — Progress Notes (Signed)
Progress Note    [name]  RSW:[id number] DOB: 04/25/1941  DOA: 05/18/2020 PCP: Clinton Quant, MD    Brief Narrative:    Medical records reviewed and are as summarized below:  [name] is an 79 y.o. female with multiple medical issues including type 2 diabetes, obesity, chronic kidney disease.    Assessment/Plan:   Principal Problem:   Right tibial fracture Active Problems:   Fall   Hypertension   Diabetes ([place])   Dyslipidemia   GERD (gastroesophageal reflux disease)   Acute on chronic kidney failure (HCC)   Thrombocytopenia (HCC)   Closed right tibial fracture   Right tibial fracture: Status post fall -per primary -s/p intramedullary nailing today with Dr. Stann Mainland  AKI on CKD stage IIIb: -appears to be back to baseline -resume torsemide in Am  Hyperkalemia -lokelma x1  Chronic diastolic CHF: Patient appears euvolemic on exam. -BNP: WNL.  Reviewed echo from 05/14/2018 which showed ejection fraction of 50 to 55% -Resume torsemide in the AM -daily weights  Hypertension: Stable -Continue hydralazine, labetalol, hold irbesartan due to worsening kidney function  Sinus node dysfunction status post PPM: -Followed with cardiology outpatient  Hyperlipidemia:  -Continue statin  Type 2 diabetes mellitus:  -Hold Jardiance -sliding scale insulin -start lantus at a lower dose as diet should be more controlled in the hospital  GERD:  -Continue PPI  Restless leg syndrome:  -Continue Mirapex and gabapentin  Depression/anxiety:  -Continue Cymbalta  Thrombocytopenia:  -monitor plts  Obesity  Body mass index is 38.94 kg/m.   Family Communication/Anticipated D/C date and plan/Code Status   DVT prophylaxis: Lovenox ordered. Code Status: Full Code.  Disposition Plan: Status is: Inpatient          Subjective:   Having issues with IV today  Objective:    Vitals:   05/19/20 2146 05/19/20 2200 05/20/20 0300 05/20/20 0740    BP: (!) 156/67  (!) 140/52 (!) 128/45  Pulse: 70  82 69  Resp:    16  Temp: 98.4 F (36.9 C)  98.2 F (36.8 C) (!) 97.4 F (36.3 C)  TempSrc: Oral  Oral Oral  SpO2: 90% 90% 98% 96%  Weight:      Height:        Intake/Output Summary (Last 24 hours) at 05/20/2020 0938 Last data filed at 05/20/2020 0300 Gross per 24 hour  Intake 1340 ml  Output 650 ml  Net 690 ml   Filed Weights   05/18/20 2027 05/19/20 1736  Weight: 99.7 kg 99.7 kg    Exam:  General: Appearance:    Obese female in no acute distress     Lungs:     respirations unlabored  Heart:    Normal heart rate.    MS:   All extremities are intact. Right leg wrapped  Neurologic:   Awake, alert, oriented x 3. No apparent focal neurological           defect.     Data Reviewed:   I have personally reviewed following labs and imaging studies:  Labs: Labs show the following:   Basic Metabolic Panel: Recent Labs  Lab 05/18/20 1646 05/20/20 0501  NA 139 140  K 4.4 5.3*  CL 103 106  CO2 26 26  GLUCOSE 127* 212*  BUN 54* 33*  CREATININE 2.10* 1.50*  CALCIUM 8.7* 8.6*  MG  --  2.1   GFR Estimated Creatinine Clearance: 33.1 mL/min (A) (by C-G formula based on SCr of 1.5 mg/dL (H)).  Liver Function Tests: No results for input(s): AST, ALT, ALKPHOS, BILITOT, PROT, ALBUMIN in the last 168 hours. No results for input(s): LIPASE, AMYLASE in the last 168 hours. No results for input(s): AMMONIA in the last 168 hours. Coagulation profile No results for input(s): INR, PROTIME in the last 168 hours.  CBC: Recent Labs  Lab 05/18/20 1646 05/20/20 0501  WBC 8.7 7.3  NEUTROABS 6.9 6.3  HGB 12.0 11.2*  HCT 38.7 37.4  MCV 93.7 93.3  PLT 138* 134*   Cardiac Enzymes: No results for input(s): CKTOTAL, CKMB, CKMBINDEX, TROPONINI in the last 168 hours. BNP (last 3 results) No results for input(s): PROBNP in the last 8760 hours. CBG: Recent Labs  Lab 05/18/20 2309 05/19/20 1715 05/19/20 2119 05/19/20 2153  05/20/20 0639  GLUCAP 270* 91 119* 143* 183*   D-Dimer: No results for input(s): DDIMER in the last 72 hours. Hgb A1c: Recent Labs    05/20/20 0501  HGBA1C 6.7*   Lipid Profile: No results for input(s): CHOL, HDL, LDLCALC, TRIG, CHOLHDL, LDLDIRECT in the last 72 hours. Thyroid function studies: No results for input(s): TSH, T4TOTAL, T3FREE, THYROIDAB in the last 72 hours.  Invalid input(s): FREET3 Anemia work up: No results for input(s): VITAMINB12, FOLATE, FERRITIN, TIBC, IRON, RETICCTPCT in the last 72 hours. Sepsis Labs: Recent Labs  Lab 05/18/20 1646 05/20/20 0501  WBC 8.7 7.3    Microbiology Recent Results (from the past 240 hour(s))  SARS Coronavirus 2 by RT PCR (hospital order, performed in [place] hospital lab)     Status: None   Collection Time: 05/18/20  5:33 PM  Result Value Ref Range Status   SARS Coronavirus 2 NEGATIVE NEGATIVE Final    Comment: (NOTE) SARS-CoV-2 target nucleic acids are NOT DETECTED.  The SARS-CoV-2 RNA is generally detectable in upper and lower respiratory specimens during the acute phase of infection. The lowest concentration of SARS-CoV-2 viral copies this assay can detect is 250 copies / mL. A negative result does not preclude SARS-CoV-2 infection and should not be used as the sole basis for treatment or other patient management decisions.  A negative result may occur with improper specimen collection / handling, submission of specimen other than nasopharyngeal swab, presence of viral mutation(s) within the areas targeted by this assay, and inadequate number of viral copies (<250 copies / mL). A negative result must be combined with clinical observations, patient history, and epidemiological information.  Fact Sheet for Patients:   StrictlyIdeas.no  Fact Sheet for Healthcare Providers: BankingDealers.co.za  This test is not yet approved or  cleared by the [place] FDA  and has been authorized for detection and/or diagnosis of SARS-CoV-2 by FDA under an Emergency Use Authorization (EUA).  This EUA will remain in effect (meaning this test can be used) for the duration of the COVID-19 declaration under Section 564(b)(1) of the Act, 21 U.S.C. section 360bbb-3(b)(1), unless the authorization is terminated or revoked sooner.  Performed at [facility], [place] [address], [place], [place] [address]   Surgical pcr screen     Status: None   Collection Time: 05/18/20 10:51 PM   Specimen: Nasal Mucosa; Nasal Swab  Result Value Ref Range Status   MRSA, PCR NEGATIVE NEGATIVE Final   Staphylococcus aureus NEGATIVE NEGATIVE Final    Comment: (NOTE) The Xpert SA Assay (FDA approved for NASAL specimens in patients 15 years of age and older), is one component of a comprehensive surveillance program. It is not intended to diagnose infection nor to  guide or monitor treatment. Performed at [facility], [place] [address], [place], [place] [address]     Procedures and diagnostic studies:  DG Tibia/Fibula Right  Result Date: 05/19/2020 CLINICAL DATA:  Right tibial IM nail. EXAM: RIGHT TIBIA AND FIBULA - 2 VIEW; DG C-ARM 1-60 MIN COMPARISON:  Preoperative radiograph yesterday. FINDINGS: Five fluoroscopic spot views obtained in the operating room. Intramedullary nail with proximal and distal locking screws traverse distal tibial fracture. The fracture is in improved alignment compared to preoperative imaging. Segmental fibular fracture is also in improved alignment. Total fluoroscopy time 4 minutes 3 seconds. IMPRESSION: Intraoperative fluoroscopy during right tibial intramedullary nail placement. Electronically Signed   By: Keith Rake M.D.   On: 05/19/2020 20:16   DG Tibia/Fibula Right  Result Date: 05/18/2020 CLINICAL DATA:  Recent fall with known distal tibial and fibular fractures EXAM: RIGHT TIBIA AND FIBULA - 2 VIEW COMPARISON:  Ankle films  from earlier in the same day. FINDINGS: In addition to the distal tibial and fibular fractures there is a proximal fibular shaft fracture just below the fibular neck. The proximal tibia appears within normal limits. No other focal abnormality is noted. IMPRESSION: Distal tibial fracture with evidence of proximal and distal fibular fractures. Electronically Signed   By: Inez Catalina M.D.   On: 05/18/2020 15:16   DG Ankle Complete Right  Result Date: 05/18/2020 CLINICAL DATA:  Recent trip and fall with ankle pain, initial encounter EXAM: RIGHT ANKLE - COMPLETE 3+ VIEW COMPARISON:  None. FINDINGS: Oblique fracture is noted through the distal tibial diaphysis as well as a comminuted fracture of the distal fibular diaphysis. Medial angulation of the distal fracture fragments is noted. Some slight posterior displacement is noted as well. Severe tarsal degenerative changes are noted in the foot although no fractures are seen in the foot. IMPRESSION: Tibial and fibular fractures as described with medial angulation distally. Electronically Signed   By: Inez Catalina M.D.   On: 05/18/2020 15:15   DG C-Arm 1-60 Min  Result Date: 05/19/2020 CLINICAL DATA:  Right tibial IM nail. EXAM: RIGHT TIBIA AND FIBULA - 2 VIEW; DG C-ARM 1-60 MIN COMPARISON:  Preoperative radiograph yesterday. FINDINGS: Five fluoroscopic spot views obtained in the operating room. Intramedullary nail with proximal and distal locking screws traverse distal tibial fracture. The fracture is in improved alignment compared to preoperative imaging. Segmental fibular fracture is also in improved alignment. Total fluoroscopy time 4 minutes 3 seconds. IMPRESSION: Intraoperative fluoroscopy during right tibial intramedullary nail placement. Electronically Signed   By: Keith Rake M.D.   On: 05/19/2020 20:16    Medications:   . docusate sodium  100 mg Oral BID  . doxylamine (Sleep)  25 mg Oral QHS  . DULoxetine  20 mg Oral QHS  . enoxaparin (LOVENOX)  injection  30 mg Subcutaneous Q24H  . gabapentin  300 mg Oral QHS  . hydrALAZINE  100 mg Oral TID  . insulin aspart  0-15 Units Subcutaneous TID WC  . insulin aspart  0-5 Units Subcutaneous QHS  . insulin glargine  50 Units Subcutaneous Daily  . labetalol  200 mg Oral BID  . pantoprazole  40 mg Oral Daily  . pramipexole  0.25 mg Oral BID  . rosuvastatin  10 mg Oral Daily   Continuous Infusions: .  ceFAZolin (ANCEF) IV 1 g (05/20/20 4166)     LOS: 2 days   Geradine Girt  [facility]   How to contact the [facility] Attending or Consulting  provider [place] or covering provider during after hours [place], for this patient?  1. Check the care team in [facility] and look for a) attending/consulting TRH provider listed and b) the [facility] team listed 2. Log into [email or web address] and use [place]'s universal password to access. If you do not have the password, please contact the hospital operator. 3. Locate the [facility] provider you are looking for under [facility] and page to a number that you can be directly reached. 4. If you still have difficulty reaching the provider, please page the [facility] (Director on Call) for the Hospitalists listed on amion for assistance.  05/20/2020, 9:22 AM

## 2020-05-20 NOTE — Progress Notes (Signed)
Orthopedic Tech Progress Note Patient Details:  [name] Jul 05, 1941 [id number]  Ortho Devices Type of Ortho Device: CAM walker Ortho Device/Splint Location: RLE Ortho Device/Splint Interventions: Application, Adjustment   Post Interventions Patient Tolerated: Well Instructions Provided: Adjustment of device, Poper ambulation with device   [name] 05/20/2020, 5:08 AM

## 2020-05-20 NOTE — Plan of Care (Signed)

## 2020-05-20 NOTE — Evaluation (Signed)
Occupational Therapy Evaluation Patient Details Name: [name] MRN: [id number] DOB: 1941-06-28 Today's Date: 05/20/2020    History of Present Illness 79 y.o female presenting after a fall resulting in R tib/fib fx. Pt is now s/p IM tibial nail. PMH includes HTN, DM2, GERD, AKI   Clinical Impression   PTA pt living alone with intermittent assist from family whom lives close by. Pt reports history of many falls, but "none that caused injury". At time of eval, pt presents with ability to complete bed mobility at min A and sit <> stands at min-mod A to maintain NWB status with RW. Pt is currently completing LB BADL at max A level. Pt reports she was beginning to have difficulty with LB dressing and bathing at baseline. She was able to simulate toilet transfer to recliner with mod A with RW and max VC's to maintain RLE PWB status. Given current status, recommend SNF  to support safety, BADL engagement, independent PLOF. OT will continue to follow per POC listed below.    Follow Up Recommendations  SNF;Supervision/Assistance - 24 hour    Equipment Recommendations  3 in 1 bedside commode;Wheelchair (measurements OT);Wheelchair cushion (measurements OT)    Recommendations for Other Services       Precautions / Restrictions Precautions Precautions: Fall Required Braces or Orthoses: Other Brace Other Brace: CAM boot RLE Restrictions Weight Bearing Restrictions: Yes RLE Weight Bearing: Partial weight bearing RLE Partial Weight Bearing Percentage or Pounds: 50% with CAM boot      Mobility Bed Mobility Overal bed mobility: Needs Assistance Bed Mobility: Supine to Sit     Supine to sit: Min assist     General bed mobility comments: assist to get RLE to EOB  Transfers Overall transfer level: Needs assistance Equipment used: Rolling walker (2 wheeled) Transfers: Sit to/from [facility] Sit to Stand: Min assist Stand pivot transfers: Mod assist       General  transfer comment: min A to rise and steady with RW; mod A to pivot to recliner    Balance Overall balance assessment: Needs assistance Sitting-balance support: Bilateral upper extremity supported;Feet supported Sitting balance-Leahy Scale: Fair     Standing balance support: Bilateral upper extremity supported;During functional activity Standing balance-Leahy Scale: Poor Standing balance comment: reliant on external support                           ADL either performed or assessed with clinical judgement   ADL Overall ADL's : Needs assistance/impaired Eating/Feeding: Set up;Sitting   Grooming: Set up;Sitting   Upper Body Bathing: Minimal assistance;Sitting   Lower Body Bathing: Maximal assistance;Sitting/lateral leans;Sit to/from stand Lower Body Bathing Details (indicate cue type and reason): reports she has difficulty reaching over to her feet Upper Body Dressing : Set up;Sitting   Lower Body Dressing: Maximal assistance;Sitting/lateral leans;Sit to/from stand Lower Body Dressing Details (indicate cue type and reason): assist to don CAM boot seated EOB. Pt reports she does not wear socks at baseline because she cannot reach her feet from seated position Toilet Transfer: Moderate assistance;Stand-pivot;BSC Toilet Transfer Details (indicate cue type and reason): simulated to recliner. Cues to maintain 50% WB Toileting- Clothing Manipulation and Hygiene: Moderate assistance;Sitting/lateral lean       Functional mobility during ADLs: Moderate assistance (single point t/f only) General ADL Comments: cues to maintain 50% WB status     Vision Patient Visual Report: No change from baseline       Perception  Praxis      Pertinent Vitals/Pain Pain Assessment: Faces Faces Pain Scale: Hurts little more Pain Location: R ankle Pain Descriptors / Indicators: Aching;Sore Pain Intervention(s): Limited activity within patient's tolerance;Monitored during  session;Repositioned     Hand Dominance     Extremity/Trunk Assessment Upper Extremity Assessment Upper Extremity Assessment: Generalized weakness   Lower Extremity Assessment Lower Extremity Assessment: Generalized weakness       Communication Communication Communication: No difficulties   Cognition Arousal/Alertness: Awake/alert Behavior During Therapy: WFL for tasks assessed/performed Overall Cognitive Status: Within Functional Limits for tasks assessed                                     General Comments       Exercises     Shoulder Instructions      Home Living Family/patient expects to be discharged to:: Private residence Living Arrangements: Alone Available Help at Discharge: Family;Available PRN/intermittently Type of Home: House Home Access: Stairs to enter [employer] of Steps: 1   Home Layout: One level     Bathroom Shower/Tub: Occupational psychologist: Standard     Home Equipment: Cane - single point;Shower seat   Additional Comments: Does not shower in shower- washes up at the sink      Prior Functioning/Environment Level of Independence: Independent with assistive device(s)        Comments: hx of frequent falls; occasional cane usage        OT Problem List: Decreased strength;Decreased knowledge of use of DME or AE;Decreased knowledge of precautions;Decreased activity tolerance;Impaired balance (sitting and/or standing);Pain      OT Treatment/Interventions: Self-care/ADL training;Therapeutic exercise;Patient/family education;Balance training;Therapeutic activities;DME and/or AE instruction    OT Goals(Current goals can be found in the care plan section) Acute Rehab OT Goals Patient Stated Goal: return to independence OT Goal Formulation: With patient Time For Goal Achievement: 06/03/20 Potential to Achieve Goals: Good  OT Frequency: Min 2X/week   Barriers to D/C:            Co-evaluation               AM-PAC OT "6 Clicks" Daily Activity     Outcome Measure Help from another person eating meals?: A Little Help from another person taking care of personal grooming?: A Little Help from another person toileting, which includes using toliet, bedpan, or urinal?: A Lot Help from another person bathing (including washing, rinsing, drying)?: A Lot Help from another person to put on and taking off regular upper body clothing?: A Little Help from another person to put on and taking off regular lower body clothing?: A Lot 6 Click Score: 15   End of Session Equipment Utilized During Treatment: Gait belt;Rolling walker;Other (comment) (R CAM boot) Nurse Communication: Mobility status  Activity Tolerance: Patient tolerated treatment well Patient left: in chair;with call bell/phone within reach;with chair alarm set  OT Visit Diagnosis: Unsteadiness on feet (R26.81);Other abnormalities of gait and mobility (R26.89);Muscle weakness (generalized) (M62.81);Pain Pain - Right/Left: Right Pain - part of body: Ankle and joints of foot                Time: 0920-0951 OT Time Calculation (min): 31 min Charges:  OT General Charges $OT Visit: 1 Visit OT Evaluation $OT Eval Moderate Complexity: 1 Mod OT Treatments $Self Care/Home Management : 8-22 mins  Zenovia Jarred, MSOT, OTR/L Acute Rehabilitation Services [facility] Office Number: [phone]  Pager: [phone]  Zenovia Jarred 05/20/2020, 10:46 AM

## 2020-05-21 LAB — CBC
HCT: 33.2 % — ABNORMAL LOW (ref 36.0–46.0)
Hemoglobin: 10.1 g/dL — ABNORMAL LOW (ref 12.0–15.0)
MCH: 29.1 pg (ref 26.0–34.0)
MCHC: 30.4 g/dL (ref 30.0–36.0)
MCV: 95.7 fL (ref 80.0–100.0)
Platelets: 146 10*3/uL — ABNORMAL LOW (ref 150–400)
RBC: 3.47 MIL/uL — ABNORMAL LOW (ref 3.87–5.11)
RDW: 13.6 % (ref 11.5–15.5)
WBC: 7.3 10*3/uL (ref 4.0–10.5)
nRBC: 0 % (ref 0.0–0.2)

## 2020-05-21 LAB — GLUCOSE, CAPILLARY
Glucose-Capillary: 136 mg/dL — ABNORMAL HIGH (ref 70–99)
Glucose-Capillary: 217 mg/dL — ABNORMAL HIGH (ref 70–99)
Glucose-Capillary: 129 mg/dL — ABNORMAL HIGH (ref 70–99)
Glucose-Capillary: 183 mg/dL — ABNORMAL HIGH (ref 70–99)

## 2020-05-21 LAB — BASIC METABOLIC PANEL
Anion gap: 7 (ref 5–15)
BUN: 52 mg/dL — ABNORMAL HIGH (ref 8–23)
CO2: 28 mmol/L (ref 22–32)
Calcium: 8.7 mg/dL — ABNORMAL LOW (ref 8.9–10.3)
Chloride: 104 mmol/L (ref 98–111)
Creatinine, Ser: 1.72 mg/dL — ABNORMAL HIGH (ref 0.44–1.00)
GFR calc Af Amer: 32 mL/min — ABNORMAL LOW (ref >60)
GFR calc non Af Amer: 27 mL/min — ABNORMAL LOW (ref >60)
Glucose, Bld: 135 mg/dL — ABNORMAL HIGH (ref 70–99)
Potassium: 4.7 mmol/L (ref 3.5–5.1)
Sodium: 139 mmol/L (ref 135–145)

## 2020-05-21 MED ORDER — TRAMADOL HCL 50 MG PO TABS
50.0000 mg | ORAL_TABLET | Freq: Four times a day (QID) | ORAL | Status: DC | PRN
  Administered 2020-05-21: 50 mg via ORAL

## 2020-05-21 MED ORDER — TRAMADOL HCL 50 MG PO TABS
50.0000 mg | ORAL_TABLET | Freq: Four times a day (QID) | ORAL | Status: DC | PRN
  Administered 2020-05-21: 50 mg via ORAL
  Administered 2020-05-22 – 2020-05-24 (×4): 100 mg via ORAL
  Filled 2020-05-21 (×2): qty 2
  Filled 2020-05-21: qty 1
  Filled 2020-05-21 (×3): qty 2

## 2020-05-21 MED ORDER — ACETAMINOPHEN 500 MG PO TABS
1000.0000 mg | ORAL_TABLET | Freq: Three times a day (TID) | ORAL | Status: DC
  Administered 2020-05-21 – 2020-05-24 (×10): 1000 mg via ORAL
  Filled 2020-05-21 (×10): qty 2

## 2020-05-21 MED ORDER — HYDRALAZINE HCL 50 MG PO TABS
50.0000 mg | ORAL_TABLET | Freq: Three times a day (TID) | ORAL | Status: DC
  Administered 2020-05-21 – 2020-05-24 (×9): 50 mg via ORAL
  Filled 2020-05-21 (×9): qty 1

## 2020-05-21 MED ORDER — DIPHENHYDRAMINE HCL 25 MG PO CAPS
25.0000 mg | ORAL_CAPSULE | Freq: Once | ORAL | Status: AC
  Administered 2020-05-21: 25 mg via ORAL
  Filled 2020-05-21: qty 1

## 2020-05-21 MED ORDER — POLYETHYLENE GLYCOL 3350 17 G PO PACK
17.0000 g | PACK | Freq: Every day | ORAL | Status: DC | PRN
  Administered 2020-05-21: 17 g via ORAL
  Filled 2020-05-21: qty 1

## 2020-05-21 NOTE — Plan of Care (Signed)
  Problem: Education: Goal: Knowledge of General Education information will improve Description: Including pain rating scale, medication(s)/side effects and non-pharmacologic comfort measures Outcome: Progressing   Problem: Health Behavior/Discharge Planning: Goal: Ability to manage health-related needs will improve Outcome: Progressing   Problem: Clinical Measurements: Goal: Ability to maintain clinical measurements within normal limits will improve Outcome: Progressing Goal: Will remain free from infection Outcome: Progressing Goal: Diagnostic test results will improve Outcome: Progressing Goal: Respiratory complications will improve Outcome: Progressing Goal: Cardiovascular complication will be avoided Outcome: Progressing   Problem: Activity: Goal: Risk for activity intolerance will decrease Outcome: Progressing   Problem: Pain Managment: Goal: General experience of comfort will improve Outcome: Progressing   Problem: Safety: Goal: Ability to remain free from injury will improve Outcome: Progressing   

## 2020-05-21 NOTE — Progress Notes (Signed)
Subjective: 2 Days Post-Op Procedure(s) (LRB): INTRAMEDULLARY (IM) NAIL TIBIAL (Right) Patient reports pain as mild.    Objective: Vital signs in last 24 hours: Temp:  [97.3 F (36.3 C)-98.8 F (37.1 C)] 98 F (36.7 C) (09/11 0425) Pulse Rate:  [69-116] 70 (09/11 0425) Resp:  [16-18] 18 (09/10 2246) BP: (99-124)/(40-108) 110/57 (09/11 0425) SpO2:  [94 %-95 %] 95 % (09/11 0425)  Intake/Output from previous day: 09/10 0701 - 09/11 0700 In: 360 [P.O.:360] Out: -  Intake/Output this shift: No intake/output data recorded.  Recent Labs    05/18/20 1646 05/20/20 0501 05/21/20 0707  HGB 12.0 11.2* 10.1*   Recent Labs    05/20/20 0501 05/21/20 0707  WBC 7.3 7.3  RBC 4.01 3.47*  HCT 37.4 33.2*  PLT 134* 146*   Recent Labs    05/20/20 0501 05/21/20 0707  NA 140 139  K 5.3* 4.7  CL 106 104  CO2 26 28  BUN 33* 52*  CREATININE 1.50* 1.72*  GLUCOSE 212* 135*  CALCIUM 8.6* 8.7*   No results for input(s): LABPT, INR in the last 72 hours.  Neurologically intact ABD soft Neurovascular intact Sensation intact distally Intact pulses distally Dorsiflexion/Plantar flexion intact Incision: no drainage No cellulitis present Compartment soft no calf pain or sign of DVT   Assessment/Plan: 2 Days Post-Op Procedure(s) (LRB): INTRAMEDULLARY (IM) NAIL TIBIAL (Right) Advance diet Up with therapy Awaiting SNF 50% PWB Lovenox for DVT ppx   Cecilie Kicks 05/21/2020, 10:31 AM

## 2020-05-21 NOTE — Progress Notes (Signed)
Progress Note    [name]  BJY:[id number] DOB: [birth year]  DOA: 05/18/2020 PCP: Clinton Quant, MD    Brief Narrative:    Medical records reviewed and are as summarized below:  [name] is an 79 y.o. female with multiple medical issues including type 2 diabetes, obesity, chronic kidney disease brought in for right tibial fracture. TRH consulted for medical management.    Assessment/Plan:   Principal Problem:   Right tibial fracture Active Problems:   Fall   Hypertension   Diabetes ([place])   Dyslipidemia   GERD (gastroesophageal reflux disease)   Acute on chronic kidney failure (HCC)   Thrombocytopenia (HCC)   Closed right tibial fracture   Right tibial fracture: Status post fall -per primary -s/p intramedullary nailing  with Dr. Stann Mainland 9/9  AKI on CKD stage IIIb: -appears to be back to baseline -resume torsemide at a lower dose and monitor  Hyperkalemia -lokelma x1 -resolved  Chronic diastolic CHF: Patient appears euvolemic on exam. -BNP: WNL.  Reviewed echo from 05/14/2018 which showed ejection fraction of 50 to 55% -Resume torsemide -daily weights  Hypertension: Stable -avoid hypotension -decrease hydralazine  Sinus node dysfunction status post PPM: -Followed with cardiology outpatient  Hyperlipidemia:  -Continue statin  Type 2 diabetes mellitus:  -Hold Jardiance -sliding scale insulin -start lantus at a lower dose as diet should be more controlled in the hospital and would like to avoid hypoglycemia  GERD:  -Continue PPI  Restless leg syndrome:  -Continue Mirapex and gabapentin  Depression/anxiety:  -Continue Cymbalta  Thrombocytopenia:  -monitor plts  Obesity  Body mass index is 38.94 kg/m.   Family Communication/Anticipated D/C date and plan/Code Status   DVT prophylaxis: Lovenox ordered. Code Status: Full Code.  Disposition Plan: Status is: Inpatient          Subjective:   Had increased pain  last PM and needed IV pain meds  Objective:    Vitals:   05/20/20 1441 05/20/20 1825 05/20/20 2246 05/21/20 0425  BP: (!) 115/43 (!) 107/40 (!) 99/42 (!) 110/57  Pulse: 70 71 69 70  Resp: 17 16 18    Temp: (!) 97.3 F (36.3 C) 98.4 F (36.9 C) 98.6 F (37 C) 98 F (36.7 C)  TempSrc: Oral Oral Oral Oral  SpO2: 94% 94% 95% 95%  Weight:      Height:        Intake/Output Summary (Last 24 hours) at 05/21/2020 0953 Last data filed at 05/20/2020 1800 Gross per 24 hour  Intake 120 ml  Output --  Net 120 ml   Filed Weights   05/18/20 2027 05/19/20 1736  Weight: 99.7 kg 99.7 kg    Exam:   General: Appearance:    Obese female in no acute distress     Lungs:    respirations unlabored  Heart:    Normal heart rate.   MS:   All extremities are intact.   Neurologic:   Awake, alert    Data Reviewed:   I have personally reviewed following labs and imaging studies:  Labs: Labs show the following:   Basic Metabolic Panel: Recent Labs  Lab 05/18/20 1646 05/18/20 1646 05/20/20 0501 05/21/20 0707  NA 139  --  140 139  K 4.4   < > 5.3* 4.7  CL 103  --  106 104  CO2 26  --  26 28  GLUCOSE 127*  --  212* 135*  BUN 54*  --  33* 52*  CREATININE 2.10*  --  1.50* 1.72*  CALCIUM 8.7*  --  8.6* 8.7*  MG  --   --  2.1  --    < > = values in this interval not displayed.   GFR Estimated Creatinine Clearance: 28.9 mL/min (A) (by C-G formula based on SCr of 1.72 mg/dL (H)). Liver Function Tests: No results for input(s): AST, ALT, ALKPHOS, BILITOT, PROT, ALBUMIN in the last 168 hours. No results for input(s): LIPASE, AMYLASE in the last 168 hours. No results for input(s): AMMONIA in the last 168 hours. Coagulation profile No results for input(s): INR, PROTIME in the last 168 hours.  CBC: Recent Labs  Lab 05/18/20 1646 05/20/20 0501 05/21/20 0707  WBC 8.7 7.3 7.3  NEUTROABS 6.9 6.3  --   HGB 12.0 11.2* 10.1*  HCT 38.7 37.4 33.2*  MCV 93.7 93.3 95.7  PLT 138* 134* 146*    Cardiac Enzymes: No results for input(s): CKTOTAL, CKMB, CKMBINDEX, TROPONINI in the last 168 hours. BNP (last 3 results) No results for input(s): PROBNP in the last 8760 hours. CBG: Recent Labs  Lab 05/20/20 0639 05/20/20 1128 05/20/20 1625 05/20/20 1944 05/21/20 0631  GLUCAP 183* 236* 161* 188* 136*   D-Dimer: No results for input(s): DDIMER in the last 72 hours. Hgb A1c: Recent Labs    05/20/20 0501  HGBA1C 6.7*   Lipid Profile: No results for input(s): CHOL, HDL, LDLCALC, TRIG, CHOLHDL, LDLDIRECT in the last 72 hours. Thyroid function studies: No results for input(s): TSH, T4TOTAL, T3FREE, THYROIDAB in the last 72 hours.  Invalid input(s): FREET3 Anemia work up: No results for input(s): VITAMINB12, FOLATE, FERRITIN, TIBC, IRON, RETICCTPCT in the last 72 hours. Sepsis Labs: Recent Labs  Lab 05/18/20 1646 05/20/20 0501 05/21/20 0707  WBC 8.7 7.3 7.3    Microbiology Recent Results (from the past 240 hour(s))  SARS Coronavirus 2 by RT PCR (hospital order, performed in [facility] [place] hospital lab)     Status: None   Collection Time: 05/18/20  5:33 PM  Result Value Ref Range Status   SARS Coronavirus 2 NEGATIVE NEGATIVE Final    Comment: (NOTE) SARS-CoV-2 target nucleic acids are NOT DETECTED.  The SARS-CoV-2 RNA is generally detectable in upper and lower respiratory specimens during the acute phase of infection. The lowest concentration of SARS-CoV-2 viral copies this assay can detect is 250 copies / mL. A negative result does not preclude SARS-CoV-2 infection and should not be used as the sole basis for treatment or other patient management decisions.  A negative result may occur with improper specimen collection / handling, submission of specimen other than nasopharyngeal swab, presence of viral mutation(s) within the areas targeted by this assay, and inadequate number of viral copies (<250 copies / mL). A negative result must be combined with  clinical observations, patient history, and epidemiological information.  Fact Sheet for Patients:   StrictlyIdeas.no  Fact Sheet for Healthcare Providers: BankingDealers.co.za  This test is not yet approved or  cleared by the [place] FDA and has been authorized for detection and/or diagnosis of SARS-CoV-2 by FDA under an Emergency Use Authorization (EUA).  This EUA will remain in effect (meaning this test can be used) for the duration of the COVID-19 declaration under Section 564(b)(1) of the Act, 21 U.S.C. section 360bbb-3(b)(1), unless the authorization is terminated or revoked sooner.  Performed at [facility], [place] [address], [place], [place] [address]   Surgical pcr screen     Status: None   Collection Time: 05/18/20 10:51 PM  Specimen: Nasal Mucosa; Nasal Swab  Result Value Ref Range Status   MRSA, PCR NEGATIVE NEGATIVE Final   Staphylococcus aureus NEGATIVE NEGATIVE Final    Comment: (NOTE) The Xpert SA Assay (FDA approved for NASAL specimens in patients 67 years of age and older), is one component of a comprehensive surveillance program. It is not intended to diagnose infection nor to guide or monitor treatment. Performed at [facility], [place] [address], [place], [place] [address]     Procedures and diagnostic studies:  DG Tibia/Fibula Right  Result Date: 05/19/2020 CLINICAL DATA:  Right tibial IM nail. EXAM: RIGHT TIBIA AND FIBULA - 2 VIEW; DG C-ARM 1-60 MIN COMPARISON:  Preoperative radiograph yesterday. FINDINGS: Five fluoroscopic spot views obtained in the operating room. Intramedullary nail with proximal and distal locking screws traverse distal tibial fracture. The fracture is in improved alignment compared to preoperative imaging. Segmental fibular fracture is also in improved alignment. Total fluoroscopy time 4 minutes 3 seconds. IMPRESSION: Intraoperative fluoroscopy during right  tibial intramedullary nail placement. Electronically Signed   By: Keith Rake M.D.   On: 05/19/2020 20:16   DG C-Arm 1-60 Min  Result Date: 05/19/2020 CLINICAL DATA:  Right tibial IM nail. EXAM: RIGHT TIBIA AND FIBULA - 2 VIEW; DG C-ARM 1-60 MIN COMPARISON:  Preoperative radiograph yesterday. FINDINGS: Five fluoroscopic spot views obtained in the operating room. Intramedullary nail with proximal and distal locking screws traverse distal tibial fracture. The fracture is in improved alignment compared to preoperative imaging. Segmental fibular fracture is also in improved alignment. Total fluoroscopy time 4 minutes 3 seconds. IMPRESSION: Intraoperative fluoroscopy during right tibial intramedullary nail placement. Electronically Signed   By: Keith Rake M.D.   On: 05/19/2020 20:16    Medications:   . docusate sodium  100 mg Oral BID  . doxylamine (Sleep)  25 mg Oral QHS  . DULoxetine  20 mg Oral QHS  . enoxaparin (LOVENOX) injection  30 mg Subcutaneous Q24H  . gabapentin  300 mg Oral QHS  . hydrALAZINE  50 mg Oral TID  . insulin aspart  0-15 Units Subcutaneous TID WC  . insulin aspart  0-5 Units Subcutaneous QHS  . insulin glargine  50 Units Subcutaneous Daily  . labetalol  200 mg Oral BID  . pantoprazole  40 mg Oral Daily  . pramipexole  0.25 mg Oral BID  . rosuvastatin  10 mg Oral Daily  . torsemide  20 mg Oral Daily   Continuous Infusions:    LOS: 3 days   Geradine Girt  [facility]   How to contact the [facility] Attending or Consulting provider [place] or covering provider during after hours [place], for this patient?  1. Check the care team in [facility] and look for a) attending/consulting TRH provider listed and b) the [facility] team listed 2. Log into [email or web address] and use 's universal password to access. If you do not have the password, please contact the hospital operator. 3. Locate the [facility] provider you are looking for under [facility] and page to a number  that you can be directly reached. 4. If you still have difficulty reaching the provider, please page the [facility] (Director on Call) for the Hospitalists listed on amion for assistance.  05/21/2020, 9:53 AM

## 2020-05-22 ENCOUNTER — Encounter (HOSPITAL_COMMUNITY): Payer: Self-pay | Admitting: Orthopedic Surgery

## 2020-05-22 LAB — URINALYSIS, ROUTINE W REFLEX MICROSCOPIC
Bilirubin Urine: NEGATIVE
Glucose, UA: 150 mg/dL — AB
Hgb urine dipstick: NEGATIVE
Ketones, ur: NEGATIVE mg/dL
Leukocytes,Ua: NEGATIVE
Nitrite: NEGATIVE
Protein, ur: NEGATIVE mg/dL
Specific Gravity, Urine: 1.008 (ref 1.005–1.030)
pH: 5 (ref 5.0–8.0)

## 2020-05-22 LAB — GLUCOSE, CAPILLARY
Glucose-Capillary: 114 mg/dL — ABNORMAL HIGH (ref 70–99)
Glucose-Capillary: 133 mg/dL — ABNORMAL HIGH (ref 70–99)
Glucose-Capillary: 192 mg/dL — ABNORMAL HIGH (ref 70–99)
Glucose-Capillary: 157 mg/dL — ABNORMAL HIGH (ref 70–99)

## 2020-05-22 MED ORDER — INSULIN ASPART 100 UNIT/ML ~~LOC~~ SOLN: 0.0000 [IU] | Freq: Every day | SUBCUTANEOUS | 11 refills | Status: DC

## 2020-05-22 MED ORDER — INSULIN GLARGINE 100 UNIT/ML ~~LOC~~ SOLN: 50.0000 [IU] | Freq: Every day | SUBCUTANEOUS | 11 refills | Status: DC

## 2020-05-22 MED ORDER — INSULIN ASPART 100 UNIT/ML ~~LOC~~ SOLN: 0.0000 [IU] | Freq: Three times a day (TID) | SUBCUTANEOUS | 11 refills | Status: DC

## 2020-05-22 MED ORDER — DOCUSATE SODIUM 100 MG PO CAPS: 100.0000 mg | ORAL_CAPSULE | Freq: Two times a day (BID) | ORAL | 0 refills | Status: DC

## 2020-05-22 MED ORDER — TORSEMIDE 20 MG PO TABS
40.0000 mg | ORAL_TABLET | Freq: Every day | ORAL | Status: DC
  Administered 2020-05-22 – 2020-05-24 (×2): 40 mg via ORAL
  Filled 2020-05-22 (×3): qty 2

## 2020-05-22 MED ORDER — ACETAMINOPHEN 500 MG PO TABS
1000.0000 mg | ORAL_TABLET | Freq: Three times a day (TID) | ORAL | 0 refills | Status: DC
Start: 2020-05-22 — End: 2021-07-12

## 2020-05-22 MED ORDER — POLYETHYLENE GLYCOL 3350 17 G PO PACK: 17.0000 g | PACK | Freq: Every day | ORAL | 0 refills | Status: DC | PRN

## 2020-05-22 NOTE — Progress Notes (Signed)
Malayja Freund  MRN: [id number] DOB/Age: 81/03/1941 81 y.o. Physician: Ander Slade, M.D. 3 Days Post-Op Procedure(s) (LRB): INTRAMEDULLARY (IM) NAIL TIBIAL (Right)  Subjective: Patient resting comfortably in bed this a.m., good appetite with breakfast, does report that she was mildly "dizzy" as she sat on the bedside commode earlier this morning.  No complaints at this time. Vital Signs Temp:  [97.5 F (36.4 C)-98.2 F (36.8 C)] 98.2 F (36.8 C) (09/12 0851) Pulse Rate:  [70-75] 75 (09/12 0851) Resp:  [15-20] 20 (09/12 0851) BP: (114-159)/(45-61) 159/48 (09/12 0851) SpO2:  [95 %-99 %] 99 % (09/12 0851) Weight:  [106.5 kg] 106.5 kg (09/12 0500)  Lab Results Recent Labs    05/20/20 0501 05/21/20 0707  WBC 7.3 7.3  HGB 11.2* 10.1*  HCT 37.4 33.2*  PLT 134* 146*   BMET Recent Labs    05/20/20 0501 05/21/20 0707  NA 140 139  K 5.3* 4.7  CL 106 104  CO2 26 28  GLUCOSE 212* 135*  BUN 33* 52*  CREATININE 1.50* 1.72*  CALCIUM 8.6* 8.7*   INR  Date Value Ref Range Status  07/21/2015 1.02 0.0 - 1.4 Final     Exam  Right lower extremity elevated and wrapped with Ace bandages.  Chronic deformity noted to the right ankle which is similar to the deformities noted in the left ankle.  She otherwise remains grossly neurovascular intact.    Plan Continue elevation right lower extremity.  50% partial weightbearing right lower extremity.  Discharge planning in progress.  Patient instructed in use of incentive spirometer. Vonceil [place] M Nocole Zammit 05/22/2020, 9:16 AM   Contact # [phone]

## 2020-05-22 NOTE — Plan of Care (Signed)
  Problem: Education: Goal: Knowledge of General Education information will improve Description: Including pain rating scale, medication(s)/side effects and non-pharmacologic comfort measures Outcome: Progressing   Problem: Health Behavior/Discharge Planning: Goal: Ability to manage health-related needs will improve Outcome: Progressing   Problem: Clinical Measurements: Goal: Ability to maintain clinical measurements within normal limits will improve Outcome: Progressing Goal: Will remain free from infection Outcome: Progressing Goal: Diagnostic test results will improve Outcome: Progressing Goal: Respiratory complications will improve Outcome: Progressing Goal: Cardiovascular complication will be avoided Outcome: Progressing   Problem: Activity: Goal: Risk for activity intolerance will decrease Outcome: Progressing   Problem: Nutrition: Goal: Adequate nutrition will be maintained Outcome: Progressing   Problem: Coping: Goal: Level of anxiety will decrease Outcome: Progressing   Problem: Elimination: Goal: Will not experience complications related to bowel motility Outcome: Progressing Goal: Will not experience complications related to urinary retention Outcome: Progressing   Problem: Pain Managment: Goal: General experience of comfort will improve Outcome: Progressing   Problem: Safety: Goal: Ability to remain free from injury will improve Outcome: Progressing   Problem: Skin Integrity: Goal: Risk for impaired skin integrity will decrease Outcome: Progressing    Patient voiced complaints of burning pain when urinating, purewick removed to reduce irritation, MD notified, urine culture ordered(please see orders). Waiting on patient to void.

## 2020-05-22 NOTE — Progress Notes (Signed)
Progress Note    [name]  OXB:[id number] DOB: 09/23/1940  DOA: 05/18/2020 PCP: Clinton Quant, MD    Brief Narrative:    Medical records reviewed and are as summarized below:  [name] is an 79 y.o. female with multiple medical issues including type 2 diabetes, obesity, chronic kidney disease brought in for right tibial fracture. TRH consulted for medical management.     DISCHARGE RECOMMENDATIONS: -med rec reconciled with meds for chronic illnesses -carb mod/heart healthy diet -SNF to adjust insulin and BP medications for optimal control (ARB held for now and insulin cut in half) -bowel regimen  - CBC/BMP in 1 week -daily weights   Assessment/Plan:   Principal Problem:   Right tibial fracture Active Problems:   Fall   Hypertension   Diabetes ([place])   Dyslipidemia   GERD (gastroesophageal reflux disease)   Acute on chronic kidney failure (HCC)   Thrombocytopenia (HCC)   Closed right tibial fracture   Right tibial fracture: Status post fall -per primary -s/p intramedullary nailing  with Dr. Stann Mainland 9/9  AKI on CKD stage IIIb: -appears to be back to baseline -resume torsemide and monitor closely  Hyperkalemia -lokelma x1 -resolved  Anemia -combination of ABLA expected post op and anemia of CD from CKD -trend   Chronic diastolic CHF: Patient appears euvolemic on exam. -BNP: WNL.  Reviewed echo from 05/14/2018 which showed ejection fraction of 50 to 55% -Resume torsemide -daily weights  Hypertension: Stable -avoid hypotension -decreased hydralazine  Sinus node dysfunction status post PPM: -Followed with cardiology outpatient  Hyperlipidemia:  -Continue statin  Type 2 diabetes mellitus:  -resume Jardiance with d/c -sliding scale insulin - lantus at a lower dose as diet should be more controlled in the hospital and would like to avoid hypoglycemia  GERD:  -Continue PPI  Restless leg syndrome:  -Continue Mirapex and  gabapentin  Depression/anxiety:  -Continue Cymbalta  Thrombocytopenia:  -monitor plts  Obesity  Body mass index is 41.59 kg/m.   Family Communication/Anticipated D/C date and plan/Code Status   DVT prophylaxis: Lovenox ordered. Code Status: Full Code.  Disposition Plan: Status is: Inpatient          Subjective:   No overnight events  Objective:    Vitals:   05/21/20 1952 05/22/20 0500 05/22/20 0634 05/22/20 0851  BP: (!) 122/49  (!) 156/61 (!) 159/48  Pulse: 74  74 75  Resp: 15  16 20   Temp: 98.2 F (36.8 C)  97.7 F (36.5 C) 98.2 F (36.8 C)  TempSrc: Oral  Oral Oral  SpO2: 95%  96% 99%  Weight:  106.5 kg    Height:        Intake/Output Summary (Last 24 hours) at 05/22/2020 1203 Last data filed at 05/22/2020 6834 Gross per 24 hour  Intake 480 ml  Output 800 ml  Net -320 ml   Filed Weights   05/18/20 2027 05/19/20 1736 05/22/20 0500  Weight: 99.7 kg 99.7 kg 106.5 kg    Exam:   General: Appearance:    Severely obese female in no acute distress     Lungs:      respirations unlabored  Heart:    Normal heart rate.      Neurologic:   Awake, alert, oriented x 3. No apparent focal neurological           defect.     Data Reviewed:   I have personally reviewed following labs and imaging studies:  Labs: Labs show the following:  Basic Metabolic Panel: Recent Labs  Lab 05/18/20 1646 05/18/20 1646 05/20/20 0501 05/21/20 0707  NA 139  --  140 139  K 4.4   < > 5.3* 4.7  CL 103  --  106 104  CO2 26  --  26 28  GLUCOSE 127*  --  212* 135*  BUN 54*  --  33* 52*  CREATININE 2.10*  --  1.50* 1.72*  CALCIUM 8.7*  --  8.6* 8.7*  MG  --   --  2.1  --    < > = values in this interval not displayed.   GFR Estimated Creatinine Clearance: 30 mL/min (A) (by C-G formula based on SCr of 1.72 mg/dL (H)). Liver Function Tests: No results for input(s): AST, ALT, ALKPHOS, BILITOT, PROT, ALBUMIN in the last 168 hours. No results for input(s):  LIPASE, AMYLASE in the last 168 hours. No results for input(s): AMMONIA in the last 168 hours. Coagulation profile No results for input(s): INR, PROTIME in the last 168 hours.  CBC: Recent Labs  Lab 05/18/20 1646 05/20/20 0501 05/21/20 0707  WBC 8.7 7.3 7.3  NEUTROABS 6.9 6.3  --   HGB 12.0 11.2* 10.1*  HCT 38.7 37.4 33.2*  MCV 93.7 93.3 95.7  PLT 138* 134* 146*   Cardiac Enzymes: No results for input(s): CKTOTAL, CKMB, CKMBINDEX, TROPONINI in the last 168 hours. BNP (last 3 results) No results for input(s): PROBNP in the last 8760 hours. CBG: Recent Labs  Lab 05/21/20 0631 05/21/20 1120 05/21/20 1704 05/21/20 2055 05/22/20 0640  GLUCAP 136* 217* 129* 183* 114*   D-Dimer: No results for input(s): DDIMER in the last 72 hours. Hgb A1c: Recent Labs    05/20/20 0501  HGBA1C 6.7*   Lipid Profile: No results for input(s): CHOL, HDL, LDLCALC, TRIG, CHOLHDL, LDLDIRECT in the last 72 hours. Thyroid function studies: No results for input(s): TSH, T4TOTAL, T3FREE, THYROIDAB in the last 72 hours.  Invalid input(s): FREET3 Anemia work up: No results for input(s): VITAMINB12, FOLATE, FERRITIN, TIBC, IRON, RETICCTPCT in the last 72 hours. Sepsis Labs: Recent Labs  Lab 05/18/20 1646 05/20/20 0501 05/21/20 0707  WBC 8.7 7.3 7.3    Microbiology Recent Results (from the past 240 hour(s))  SARS Coronavirus 2 by RT PCR (hospital order, performed in [place] hospital lab)     Status: None   Collection Time: 05/18/20  5:33 PM  Result Value Ref Range Status   SARS Coronavirus 2 NEGATIVE NEGATIVE Final    Comment: (NOTE) SARS-CoV-2 target nucleic acids are NOT DETECTED.  The SARS-CoV-2 RNA is generally detectable in upper and lower respiratory specimens during the acute phase of infection. The lowest concentration of SARS-CoV-2 viral copies this assay can detect is 250 copies / mL. A negative result does not preclude SARS-CoV-2 infection and should not be used as the  sole basis for treatment or other patient management decisions.  A negative result may occur with improper specimen collection / handling, submission of specimen other than nasopharyngeal swab, presence of viral mutation(s) within the areas targeted by this assay, and inadequate number of viral copies (<250 copies / mL). A negative result must be combined with clinical observations, patient history, and epidemiological information.  Fact Sheet for Patients:   StrictlyIdeas.no  Fact Sheet for Healthcare Providers: BankingDealers.co.za  This test is not yet approved or  cleared by the [place] FDA and has been authorized for detection and/or diagnosis of SARS-CoV-2 by FDA under an Emergency Use Authorization (EUA).  This EUA will remain in effect (meaning this test can be used) for the duration of the COVID-19 declaration under Section 564(b)(1) of the Act, 21 U.S.C. section 360bbb-3(b)(1), unless the authorization is terminated or revoked sooner.  Performed at [facility], [place] [address] [place], [place] [address]   Surgical pcr screen     Status: None   Collection Time: 05/18/20 10:51 PM   Specimen: Nasal Mucosa; Nasal Swab  Result Value Ref Range Status   MRSA, PCR NEGATIVE NEGATIVE Final   Staphylococcus aureus NEGATIVE NEGATIVE Final    Comment: (NOTE) The Xpert SA Assay (FDA approved for NASAL specimens in patients 49 years of age and older), is one component of a comprehensive surveillance program. It is not intended to diagnose infection nor to guide or monitor treatment. Performed at [facility], [place] [address], [place], [place] [address]     Procedures and diagnostic studies:  No results found.  Medications:   . acetaminophen  1,000 mg Oral TID  . docusate sodium  100 mg Oral BID  . doxylamine (Sleep)  25 mg Oral QHS  . DULoxetine  20 mg Oral QHS  . enoxaparin (LOVENOX) injection   30 mg Subcutaneous Q24H  . gabapentin  300 mg Oral QHS  . hydrALAZINE  50 mg Oral TID  . insulin aspart  0-15 Units Subcutaneous TID WC  . insulin aspart  0-5 Units Subcutaneous QHS  . insulin glargine  50 Units Subcutaneous Daily  . labetalol  200 mg Oral BID  . pantoprazole  40 mg Oral Daily  . pramipexole  0.25 mg Oral BID  . rosuvastatin  10 mg Oral Daily  . torsemide  40 mg Oral Daily   Continuous Infusions:    LOS: 4 days   Geradine Girt  [facility]   How to contact the [facility] Attending or Consulting provider [place] or covering provider during after hours [place], for this patient?  1. Check the care team in [facility] and look for a) attending/consulting TRH provider listed and b) the [facility] team listed 2. Log into [email or web address] and use [place] [facility] [place] universal password to access. If you do not have the password, please contact the hospital operator. 3. Locate the [facility] provider you are looking for under [facility] and page to a number that you can be directly reached. 4. If you still have difficulty reaching the provider, please page the [facility] (Director on Call) for the Hospitalists listed on amion for assistance.  05/22/2020, 12:03 PM

## 2020-05-23 LAB — GLUCOSE, CAPILLARY
Glucose-Capillary: 103 mg/dL — ABNORMAL HIGH (ref 70–99)
Glucose-Capillary: 142 mg/dL — ABNORMAL HIGH (ref 70–99)
Glucose-Capillary: 102 mg/dL — ABNORMAL HIGH (ref 70–99)
Glucose-Capillary: 176 mg/dL — ABNORMAL HIGH (ref 70–99)

## 2020-05-23 LAB — SARS CORONAVIRUS 2 BY RT PCR (HOSPITAL ORDER, PERFORMED IN ~~LOC~~ HOSPITAL LAB): SARS Coronavirus 2: NEGATIVE

## 2020-05-23 MED ORDER — TRAMADOL HCL 50 MG PO TABS
100.0000 mg | ORAL_TABLET | Freq: Three times a day (TID) | ORAL | 0 refills | Status: DC | PRN
Start: 2020-05-23 — End: 2020-10-26

## 2020-05-23 MED ORDER — TRAMADOL HCL 50 MG PO TABS: 50.0000 mg | ORAL_TABLET | Freq: Four times a day (QID) | ORAL | Status: DC | PRN

## 2020-05-23 MED ORDER — APIXABAN 2.5 MG PO TABS: 2.5000 mg | ORAL_TABLET | Freq: Two times a day (BID) | ORAL | 0 refills | Status: DC

## 2020-05-23 NOTE — Progress Notes (Signed)
PT Cancellation Note  Patient Details Name: [name] MRN: [id number] DOB: [birth year]   Cancelled Treatment:    Reason Eval/Treat Not Completed: (P) Other (comment) (Checked on patient at 1518 and just transferred to commode with RN, pt reports she needed extended time to toilet and asked PTA to return in about 30 min, returned at 1554 and patient back to bed with NT and reports feeling dizzy.  Will f/u per POC.)   [name] Eli Hose 05/23/2020, 3:58 PM  Erasmo Leventhal , PTA Acute Rehabilitation Services Pager [phone] Office [phone]

## 2020-05-23 NOTE — Progress Notes (Signed)
Occupational Therapy Treatment Patient Details Name: [name] MRN: [id number] DOB: [birth year] Today's Date: 05/23/2020    History of present illness 79 y.o female presenting after a fall resulting in R tib/fib fx. Pt is now s/p IM tibial nail. PMH includes HTN, DM2, GERD, AKI   OT comments  PTA pt independent with ADL and mobility. Pt wanting to go home, however after session today she agrees to rehab at SNF due to the level of difficulty she has with mobility and ADL tasks, her increased risk for falls and her limited support at home after DC. Will continue to follow acutely to facilitate safe DC to SNF for rehab.   Follow Up Recommendations  SNF;Supervision/Assistance - 24 hour    Equipment Recommendations  3 in 1 bedside commode;Wheelchair (measurements OT);Wheelchair cushion (measurements OT)    Recommendations for Other Services      Precautions / Restrictions Precautions Precautions: Fall Required Braces or Orthoses: Other Brace (cam boot - on when walking) Other Brace: CAM boot RLE Restrictions Weight Bearing Restrictions: Yes RLE Weight Bearing: Partial weight bearing RLE Partial Weight Bearing Percentage or Pounds: 50%       Mobility Bed Mobility Overal bed mobility: Needs Assistance       Supine to sit: HOB elevated;Supervision        Transfers Overall transfer level: Needs assistance Equipment used: Rolling walker (2 wheeled) Transfers: Sit to/from [facility] Sit to Stand: Mod assist Stand pivot transfers: Min assist       General transfer comment: vc for hand placement    Balance Overall balance assessment: Needs assistance   Sitting balance-Leahy Scale: Good       Standing balance-Leahy Scale: Poor                             ADL either performed or assessed with clinical judgement   ADL Overall ADL's : Needs assistance/impaired                     Lower Body Dressing: Moderate  assistance;Sitting/lateral leans;Bed level Lower Body Dressing Details (indicate cue type and reason): difficulty releasing RW to donn clothing; min A to donn Cam boot             Functional mobility during ADLs: Rolling walker;Cueing for safety;Cueing for sequencing;Moderate assistance General ADL Comments: limited ability to copmlete mobility while maintaining WBS     Vision       Perception     Praxis      Cognition Arousal/Alertness: Awake/alert Behavior During Therapy: WFL for tasks assessed/performed Overall Cognitive Status: Within Functional Limits for tasks assessed                                          Exercises     Shoulder Instructions       General Comments      Pertinent Vitals/ Pain       Pain Assessment: 0-10 Pain Score: 6  (with weight bearing) Pain Location: R ankle Pain Descriptors / Indicators: Aching;Sore Pain Intervention(s): Limited activity within patient's tolerance;Repositioned  Home Living                                          Prior  Functioning/Environment              Frequency  Min 2X/week        Progress Toward Goals  OT Goals(current goals can now be found in the care plan section)  Progress towards OT goals: Progressing toward goals  Acute Rehab OT Goals Patient Stated Goal: to go home if able OT Goal Formulation: With patient Time For Goal Achievement: 06/03/20 Potential to Achieve Goals: Good ADL Goals Pt Will Perform Lower Body Bathing: with min assist;sitting/lateral leans;sit to/from stand Pt Will Perform Lower Body Dressing: with min assist;sitting/lateral leans;sit to/from stand;with adaptive equipment Pt Will Transfer to Toilet: with min assist;ambulating;regular height toilet;grab bars Pt Will Perform Tub/Shower Transfer: Shower transfer;ambulating;with min assist;rolling walker  Plan Discharge plan remains appropriate    Co-evaluation                  AM-PAC OT "6 Clicks" Daily Activity     Outcome Measure   Help from another person eating meals?: None Help from another person taking care of personal grooming?: A Little Help from another person toileting, which includes using toliet, bedpan, or urinal?: A Lot Help from another person bathing (including washing, rinsing, drying)?: A Lot Help from another person to put on and taking off regular upper body clothing?: A Little Help from another person to put on and taking off regular lower body clothing?: A Lot 6 Click Score: 16    End of Session Equipment Utilized During Treatment: Gait belt;Rolling walker  OT Visit Diagnosis: Unsteadiness on feet (R26.81);Other abnormalities of gait and mobility (R26.89);Muscle weakness (generalized) (M62.81);Pain Pain - Right/Left: Right Pain - part of body: Ankle and joints of foot   Activity Tolerance Patient tolerated treatment well   Patient Left in chair;with call bell/phone within reach;with chair alarm set   Nurse Communication Mobility status;Precautions;Weight bearing status        Time: 0925-0950 OT Time Calculation (min): 25 min  Charges: OT General Charges $OT Visit: 1 Visit OT Treatments $Self Care/Home Management : 23-37 mins  Maurie Boettcher, OT/L   Acute OT Clinical Specialist [place] Pager [phone] Office [phone]    [facility] 05/23/2020, 9:57 AM

## 2020-05-23 NOTE — TOC Progression Note (Addendum)
Transition of Care [facility]) - Progression Note    Patient Details  Name: Allona Gondek MRN: [id number] Date of Birth: 11-17-1940  Transition [facility] CM/SW Contact  Sharin Mons, RN Phone Number: [phone]  05/23/2020, 11:13 AM  Clinical Narrative:     RNCM received consult for possible SNF placement at time of discharge. RNCM spoke with patient regarding PT recommendation of SNF placement at time of discharge. Patient reported she is currently unable to care for self independently at  home given current physical needs and fall risk. Patient expressed understanding of PT recommendation and is agreeable to SNF placement at time of discharge. Patient reports no preference for  SNF. RNCM discussed insurance authorization process and provided Medicare SNF ratings list. Patient expressed being hopeful for rehab and to feel better soon. No further questions reported at this time. RNCM to continue to follow and assist with discharge planning needs. Pt is fully COVID vaccinated.   SNF referral faxed out ....BED OFFERS PENDING   SNF Authorization received, PLAN ID # [id number], APPROVED FOR 3 DAYS, START DATE 9/13, FAX # FOR [place],  [phone]  Expected Discharge Plan: Skilled Nursing Facility Barriers to Discharge: Insurance Authorization, No SNF bed  Expected Discharge Plan and Services Expected Discharge Plan: [place]   Discharge Planning Services: CM Consult                                           Social Determinants of Health (SDOH) Interventions    Readmission Risk Interventions No flowsheet data found.

## 2020-05-23 NOTE — Care Management Important Message (Signed)
Important Message  Patient Details  Name: [name] MRN: [id number] Date of Birth: [birth year]   Medicare Important Message Given:  Yes - Important Message mailed due to current National Emergency  Verbal consent obtained due to current National Emergency  Relationship to patient: Self Contact Name: Trisha Ken Call Date: 05/23/20  Time: 1351 Phone: [phone] Outcome: Spoke with contact Important Message mailed to: Patient address on file    Delorse Lek 05/23/2020, 1:51 PM

## 2020-05-23 NOTE — Progress Notes (Signed)
Patient stable for d/c.  Please see note from 9/12 with d/c recommendations.  Patient c/o dysuria. U/A not suggestive of infection, suspect related to [place]. Eulogio Bear DO

## 2020-05-23 NOTE — Discharge Instructions (Signed)
-  Okay for 50% weightbearing on the right lower extremity.  -Wear your fracture boot whenever you are up and weightbearing.  You may remove the boot when you are nonweightbearing or resting.  -Maintain strict elevation at all times with your "toes above nose."  -For mild to moderate pain use Tylenol and Advil around-the-clock.  She also apply ice to the knee and ankle as needed for pain.  She do this for 30 minutes out of each hour.  For breakthrough pain use oxycodone as necessary.  -For the prevention of blood clots you will take Eliquis twice per day for 6 weeks.

## 2020-05-23 NOTE — Discharge Summary (Signed)
Patient ID: [name] MRN: [id number] DOB/AGE: [birth year] 81 y.o.  Admit date: 05/18/2020 Discharge date:   Primary Diagnosis:  Right tibia fracture  Admission Diagnoses:  Past Medical History:  Diagnosis Date  . Anxiety   . Arthritis   . Cancer ([place])    colon  . Chronic kidney disease    stage 3  . Chronic pain syndrome   . Diabetes ([place])   . Diabetic neuropathy ([place])   . Dyslipidemia   . Early cataracts, bilateral   . Fibromyalgia   . GERD (gastroesophageal reflux disease)   . H/O syncope   . Heart murmur   . Hypertension   . Lumbar spinal stenosis    and scoliosis  . Pneumonia   . PONV (postoperative nausea and vomiting)   . Presence of permanent cardiac pacemaker   . Restless legs   . Spinal headache   . Thyroid nodule    Discharge Diagnoses:   Principal Problem:   Right tibial fracture Active Problems:   Fall   Hypertension   Diabetes ([place])   Dyslipidemia   GERD (gastroesophageal reflux disease)   Acute on chronic kidney failure (HCC)   Thrombocytopenia (HCC)   Closed right tibial fracture  Estimated body mass index is 39.52 kg/m as calculated from the following:   Height as of this encounter: 5' 3"  (1.6 m).   Weight as of this encounter: 101.2 kg.  Procedure:  Procedure(s) (LRB): INTRAMEDULLARY (IM) NAIL TIBIAL (Right)   Consults: internal medicine  HPI: [name] had an unfortunate ground-level fall that resulted in a right tibia fracture.  This was treated with an intramedullary nail and postoperative admission. Laboratory Data: Admission on 05/18/2020  Component Date Value Ref Range Status  . Sodium 05/18/2020 139  135 - 145 mmol/L Final  . Potassium 05/18/2020 4.4  3.5 - 5.1 mmol/L Final  . Chloride 05/18/2020 103  98 - 111 mmol/L Final  . CO2 05/18/2020 26  22 - 32 mmol/L Final  . Glucose, Bld 05/18/2020 127* 70 - 99 mg/dL Final   Glucose reference range applies only to samples taken after fasting for at least 8 hours.  . BUN 05/18/2020  54* 8 - 23 mg/dL Final  . Creatinine, Ser 05/18/2020 2.10* 0.44 - 1.00 mg/dL Final  . Calcium 05/18/2020 8.7* 8.9 - 10.3 mg/dL Final  . GFR calc non Af Amer 05/18/2020 22* >60 mL/min Final  . GFR calc Af Amer 05/18/2020 25* >60 mL/min Final  . Anion gap 05/18/2020 10  5 - 15 Final   Performed at [facility], [place] [address], [place], [place] [address]  . WBC 05/18/2020 8.7  4.0 - 10.5 K/uL Final  . RBC 05/18/2020 4.13  3.87 - 5.11 MIL/uL Final  . Hemoglobin 05/18/2020 12.0  12.0 - 15.0 g/dL Final  . HCT 05/18/2020 38.7  36 - 46 % Final  . MCV 05/18/2020 93.7  80.0 - 100.0 fL Final  . MCH 05/18/2020 29.1  26.0 - 34.0 pg Final  . MCHC 05/18/2020 31.0  30.0 - 36.0 g/dL Final  . RDW 05/18/2020 13.5  11.5 - 15.5 % Final  . Platelets 05/18/2020 138* 150 - 400 K/uL Final  . nRBC 05/18/2020 0.0  0.0 - 0.2 % Final  . Neutrophils Relative % 05/18/2020 78  % Final  . Neutro Abs 05/18/2020 6.9  1.7 - 7.7 K/uL Final  . Lymphocytes Relative 05/18/2020 12  % Final  . Lymphs Abs 05/18/2020 1.0  0.7 - 4.0 K/uL Final  .  Monocytes Relative 05/18/2020 7  % Final  . Monocytes Absolute 05/18/2020 0.6  0 - 1 K/uL Final  . Eosinophils Relative 05/18/2020 1  % Final  . Eosinophils Absolute 05/18/2020 0.1  0 - 0 K/uL Final  . Basophils Relative 05/18/2020 1  % Final  . Basophils Absolute 05/18/2020 0.0  0 - 0 K/uL Final  . Immature Granulocytes 05/18/2020 1  % Final  . Abs Immature Granulocytes 05/18/2020 0.04  0.00 - 0.07 K/uL Final   Performed at [facility], [place] [address] [place], [place] [address]  . SARS Coronavirus 2 05/18/2020 NEGATIVE  NEGATIVE Final   Comment: (NOTE) SARS-CoV-2 target nucleic acids are NOT DETECTED.  The SARS-CoV-2 RNA is generally detectable in upper and lower respiratory specimens during the acute phase of infection. The lowest concentration of SARS-CoV-2 viral copies this assay can detect is 250 copies / mL. A negative result does not  preclude SARS-CoV-2 infection and should not be used as the sole basis for treatment or other patient management decisions.  A negative result may occur with improper specimen collection / handling, submission of specimen other than nasopharyngeal swab, presence of viral mutation(s) within the areas targeted by this assay, and inadequate number of viral copies (<250 copies / mL). A negative result must be combined with clinical observations, patient history, and epidemiological information.  Fact Sheet for Patients:   StrictlyIdeas.no  Fact Sheet for Healthcare Providers: BankingDealers.co.za  This test is not yet approved or                           cleared by the [place] FDA and has been authorized for detection and/or diagnosis of SARS-CoV-2 by FDA under an Emergency Use Authorization (EUA).  This EUA will remain in effect (meaning this test can be used) for the duration of the COVID-19 declaration under Section 564(b)(1) of the Act, 21 U.S.C. section 360bbb-3(b)(1), unless the authorization is terminated or revoked sooner.  Performed at [facility], [place] [address] [place], [place] [address]   . MRSA, PCR 05/18/2020 NEGATIVE  NEGATIVE Final  . Staphylococcus aureus 05/18/2020 NEGATIVE  NEGATIVE Final   Comment: (NOTE) The Xpert SA Assay (FDA approved for NASAL specimens in patients 42 years of age and older), is one component of a comprehensive surveillance program. It is not intended to diagnose infection nor to guide or monitor treatment. Performed at [facility], [place] [address], [place], [place] [address]   . Glucose-Capillary 05/18/2020 270* 70 - 99 mg/dL Final   Glucose reference range applies only to samples taken after fasting for at least 8 hours.  . Glucose-Capillary 05/19/2020 91  70 - 99 mg/dL Final   Glucose reference range applies only to samples taken after fasting for at least 8  hours.  . WBC 05/20/2020 7.3  4.0 - 10.5 K/uL Final  . RBC 05/20/2020 4.01  3.87 - 5.11 MIL/uL Final  . Hemoglobin 05/20/2020 11.2* 12.0 - 15.0 g/dL Final  . HCT 05/20/2020 37.4  36 - 46 % Final  . MCV 05/20/2020 93.3  80.0 - 100.0 fL Final  . MCH 05/20/2020 27.9  26.0 - 34.0 pg Final  . MCHC 05/20/2020 29.9* 30.0 - 36.0 g/dL Final  . RDW 05/20/2020 13.4  11.5 - 15.5 % Final  . Platelets 05/20/2020 134* 150 - 400 K/uL Final  . nRBC 05/20/2020 0.0  0.0 - 0.2 % Final  . Neutrophils Relative %  05/20/2020 85  % Final  . Neutro Abs 05/20/2020 6.3  1.7 - 7.7 K/uL Final  . Lymphocytes Relative 05/20/2020 8  % Final  . Lymphs Abs 05/20/2020 0.6* 0.7 - 4.0 K/uL Final  . Monocytes Relative 05/20/2020 6  % Final  . Monocytes Absolute 05/20/2020 0.4  0 - 1 K/uL Final  . Eosinophils Relative 05/20/2020 0  % Final  . Eosinophils Absolute 05/20/2020 0.0  0 - 0 K/uL Final  . Basophils Relative 05/20/2020 0  % Final  . Basophils Absolute 05/20/2020 0.0  0 - 0 K/uL Final  . Immature Granulocytes 05/20/2020 1  % Final  . Abs Immature Granulocytes 05/20/2020 0.04  0.00 - 0.07 K/uL Final   Performed at [facility], [place] [address], [place], [place] [address]  . Sodium 05/20/2020 140  135 - 145 mmol/L Final  . Potassium 05/20/2020 5.3* 3.5 - 5.1 mmol/L Final  . Chloride 05/20/2020 106  98 - 111 mmol/L Final  . CO2 05/20/2020 26  22 - 32 mmol/L Final  . Glucose, Bld 05/20/2020 212* 70 - 99 mg/dL Final   Glucose reference range applies only to samples taken after fasting for at least 8 hours.  . BUN 05/20/2020 33* 8 - 23 mg/dL Final  . Creatinine, Ser 05/20/2020 1.50* 0.44 - 1.00 mg/dL Final  . Calcium 05/20/2020 8.6* 8.9 - 10.3 mg/dL Final  . GFR calc non Af Amer 05/20/2020 32* >60 mL/min Final  . GFR calc Af Amer 05/20/2020 37* >60 mL/min Final  . Anion gap 05/20/2020 8  5 - 15 Final   Performed at [facility], [place] [address], [place], [place] [address]  . ABO/RH(D) 05/19/2020 O POS    Final  . Antibody Screen 05/19/2020 NEG   Final  . Sample Expiration 05/19/2020    Final                   Value:05/22/2020,2359 Performed at [facility], [place] [address], [place], [place] [address]   . Glucose-Capillary 05/19/2020 119* 70 - 99 mg/dL Final   Glucose reference range applies only to samples taken after fasting for at least 8 hours.  . Comment 1 05/19/2020 Notify RN   Final  . Comment 2 05/19/2020 Document in Chart   Final  . Hgb A1c MFr Bld 05/20/2020 6.7* 4.8 - 5.6 % Final   Comment: (NOTE) Pre diabetes:          5.7%-6.4%  Diabetes:              >6.4%  Glycemic control for   <7.0% adults with diabetes   . Mean Plasma Glucose 05/20/2020 145.59  mg/dL Final   Performed at [place] [address] [place], [place] [address]  . Glucose-Capillary 05/19/2020 143* 70 - 99 mg/dL Final   Glucose reference range applies only to samples taken after fasting for at least 8 hours.  . Magnesium 05/20/2020 2.1  1.7 - 2.4 mg/dL Final   Performed at [facility], [place] [address] [place], [place] [address]  . Glucose-Capillary 05/20/2020 183* 70 - 99 mg/dL Final   Glucose reference range applies only to samples taken after fasting for at least 8 hours.  . Glucose-Capillary 05/20/2020 236* 70 - 99 mg/dL Final   Glucose reference range applies only to samples taken after fasting for at least 8 hours.  . Glucose-Capillary 05/20/2020 161* 70 - 99 mg/dL Final   Glucose reference range  applies only to samples taken after fasting for at least 8 hours.  . WBC 05/21/2020 7.3  4.0 - 10.5 K/uL Final  . RBC 05/21/2020 3.47* 3.87 - 5.11 MIL/uL Final  . Hemoglobin 05/21/2020 10.1* 12.0 - 15.0 g/dL Final  . HCT 05/21/2020 33.2* 36 - 46 % Final  . MCV 05/21/2020 95.7  80.0 - 100.0 fL Final  . MCH 05/21/2020 29.1  26.0 - 34.0 pg Final  . MCHC 05/21/2020 30.4  30.0 - 36.0 g/dL Final  . RDW 05/21/2020 13.6  11.5 - 15.5 % Final  . Platelets 05/21/2020 146* 150 - 400 K/uL Final   . nRBC 05/21/2020 0.0  0.0 - 0.2 % Final   Performed at [place] [address], [place], [place] [address]  . Sodium 05/21/2020 139  135 - 145 mmol/L Final  . Potassium 05/21/2020 4.7  3.5 - 5.1 mmol/L Final  . Chloride 05/21/2020 104  98 - 111 mmol/L Final  . CO2 05/21/2020 28  22 - 32 mmol/L Final  . Glucose, Bld 05/21/2020 135* 70 - 99 mg/dL Final   Glucose reference range applies only to samples taken after fasting for at least 8 hours.  . BUN 05/21/2020 52* 8 - 23 mg/dL Final  . Creatinine, Ser 05/21/2020 1.72* 0.44 - 1.00 mg/dL Final  . Calcium 05/21/2020 8.7* 8.9 - 10.3 mg/dL Final  . GFR calc non Af Amer 05/21/2020 27* >60 mL/min Final  . GFR calc Af Amer 05/21/2020 32* >60 mL/min Final  . Anion gap 05/21/2020 7  5 - 15 Final   Performed at [facility], [place] [address], [place], [place] [address]  . Glucose-Capillary 05/20/2020 188* 70 - 99 mg/dL Final   Glucose reference range applies only to samples taken after fasting for at least 8 hours.  . Glucose-Capillary 05/21/2020 136* 70 - 99 mg/dL Final   Glucose reference range applies only to samples taken after fasting for at least 8 hours.  . Glucose-Capillary 05/21/2020 217* 70 - 99 mg/dL Final   Glucose reference range applies only to samples taken after fasting for at least 8 hours.  . Glucose-Capillary 05/21/2020 129* 70 - 99 mg/dL Final   Glucose reference range applies only to samples taken after fasting for at least 8 hours.  . Glucose-Capillary 05/21/2020 183* 70 - 99 mg/dL Final   Glucose reference range applies only to samples taken after fasting for at least 8 hours.  . Comment 1 05/21/2020 Document in Chart   Final  . Glucose-Capillary 05/22/2020 114* 70 - 99 mg/dL Final   Glucose reference range applies only to samples taken after fasting for at least 8 hours.  . Comment 1 05/22/2020 Document in Chart   Final  . Glucose-Capillary 05/22/2020 133* 70 - 99 mg/dL Final   Glucose reference range  applies only to samples taken after fasting for at least 8 hours.  . Glucose-Capillary 05/22/2020 192* 70 - 99 mg/dL Final   Glucose reference range applies only to samples taken after fasting for at least 8 hours.  . Color, Urine 05/22/2020 YELLOW  YELLOW Final  . APPearance 05/22/2020 CLEAR  CLEAR Final  . Specific Gravity, Urine 05/22/2020 1.008  1.005 - 1.030 Final  . pH 05/22/2020 5.0  5.0 - 8.0 Final  . Glucose, UA 05/22/2020 150* NEGATIVE mg/dL Final  . Hgb urine dipstick 05/22/2020 NEGATIVE  NEGATIVE Final  . Bilirubin Urine 05/22/2020 NEGATIVE  NEGATIVE Final  . Ketones, ur 05/22/2020 NEGATIVE  NEGATIVE  mg/dL Final  . Protein, ur 05/22/2020 NEGATIVE  NEGATIVE mg/dL Final  . Nitrite 05/22/2020 NEGATIVE  NEGATIVE Final  . Chalmers Guest 05/22/2020 NEGATIVE  NEGATIVE Final  . RBC / HPF 05/22/2020 0-5  0 - 5 RBC/hpf Final  . WBC, UA 05/22/2020 0-5  0 - 5 WBC/hpf Final  . Bacteria, UA 05/22/2020 RARE* NONE SEEN Final  . Squamous Epithelial / LPF 05/22/2020 0-5  0 - 5 Final  . Hyaline Casts, UA 05/22/2020 PRESENT   Final   Performed at [facility], [place] [address], [place], [place] [address]  . Glucose-Capillary 05/22/2020 157* 70 - 99 mg/dL Final   Glucose reference range applies only to samples taken after fasting for at least 8 hours.  . Glucose-Capillary 05/23/2020 103* 70 - 99 mg/dL Final   Glucose reference range applies only to samples taken after fasting for at least 8 hours.  . Glucose-Capillary 05/23/2020 142* 70 - 99 mg/dL Final   Glucose reference range applies only to samples taken after fasting for at least 8 hours.     X-Rays:DG Tibia/Fibula Right  Result Date: 05/19/2020 CLINICAL DATA:  Right tibial IM nail. EXAM: RIGHT TIBIA AND FIBULA - 2 VIEW; DG C-ARM 1-60 MIN COMPARISON:  Preoperative radiograph yesterday. FINDINGS: Five fluoroscopic spot views obtained in the operating room. Intramedullary nail with proximal and distal locking screws traverse distal tibial  fracture. The fracture is in improved alignment compared to preoperative imaging. Segmental fibular fracture is also in improved alignment. Total fluoroscopy time 4 minutes 3 seconds. IMPRESSION: Intraoperative fluoroscopy during right tibial intramedullary nail placement. Electronically Signed   By: Keith Rake M.D.   On: 05/19/2020 20:16   DG Tibia/Fibula Right  Result Date: 05/18/2020 CLINICAL DATA:  Recent fall with known distal tibial and fibular fractures EXAM: RIGHT TIBIA AND FIBULA - 2 VIEW COMPARISON:  Ankle films from earlier in the same day. FINDINGS: In addition to the distal tibial and fibular fractures there is a proximal fibular shaft fracture just below the fibular neck. The proximal tibia appears within normal limits. No other focal abnormality is noted. IMPRESSION: Distal tibial fracture with evidence of proximal and distal fibular fractures. Electronically Signed   By: Inez Catalina M.D.   On: 05/18/2020 15:16   DG Ankle Complete Right  Result Date: 05/18/2020 CLINICAL DATA:  Recent trip and fall with ankle pain, initial encounter EXAM: RIGHT ANKLE - COMPLETE 3+ VIEW COMPARISON:  None. FINDINGS: Oblique fracture is noted through the distal tibial diaphysis as well as a comminuted fracture of the distal fibular diaphysis. Medial angulation of the distal fracture fragments is noted. Some slight posterior displacement is noted as well. Severe tarsal degenerative changes are noted in the foot although no fractures are seen in the foot. IMPRESSION: Tibial and fibular fractures as described with medial angulation distally. Electronically Signed   By: Inez Catalina M.D.   On: 05/18/2020 15:15   DG C-Arm 1-60 Min  Result Date: 05/19/2020 CLINICAL DATA:  Right tibial IM nail. EXAM: RIGHT TIBIA AND FIBULA - 2 VIEW; DG C-ARM 1-60 MIN COMPARISON:  Preoperative radiograph yesterday. FINDINGS: Five fluoroscopic spot views obtained in the operating room. Intramedullary nail with proximal and distal  locking screws traverse distal tibial fracture. The fracture is in improved alignment compared to preoperative imaging. Segmental fibular fracture is also in improved alignment. Total fluoroscopy time 4 minutes 3 seconds. IMPRESSION: Intraoperative fluoroscopy during right tibial intramedullary nail placement. Electronically Signed   By: Aurther Loft.D.  On: 05/19/2020 20:16    EKG: Orders placed or performed during the hospital encounter of 05/18/20  . EKG 12-Lead  . EKG 12-Lead     Hospital Course: [name] is a 78 y.o. who was admitted to Hospital. They were brought to the operating room on 05/19/2020 and underwent Procedure(s): INTRAMEDULLARY (IM) NAIL TIBIAL.  Patient tolerated the procedure well and was later transferred to the recovery room and then to the orthopaedic floor for postoperative care.  They were given PO and IV analgesics for pain control following their surgery.  They were given 24 hours of postoperative antibiotics of  Anti-infectives (From admission, onward)   Start     Dose/Rate Route Frequency Ordered Stop   05/20/20 0600  ceFAZolin (ANCEF) IVPB 1 g/50 mL premix        1 g 100 mL/hr over 30 Minutes Intravenous Every 12 hours 05/19/20 2138 05/20/20 2216   05/19/20 1745  ceFAZolin (ANCEF) IVPB 2g/100 mL premix        2 g 200 mL/hr over 30 Minutes Intravenous On call to O.R. 05/19/20 1710 05/19/20 1810   05/19/20 1711  ceFAZolin (ANCEF) 2-4 GM/100ML-% IVPB       Note to Pharmacy: Ladoris Gene   : cabinet override      05/19/20 1711 05/19/20 1825     and started on DVT prophylaxis in the form of Lovenox.   PT and OT were ordered Discharge planning consulted to help with postop disposition and equipment needs.  Patient had a good night on the evening of surgery.  They started to get up OOB with therapy on day one. Continued to work with therapy into day two.  Dressing was changed on day two and the incision was clean dry and intact with minimal drainage from the  distal wound.  She was neurovascularly intact as well.  Throughout her hospitalization she was followed by the internal medicine service.  They were very helpful in managing her chronic kidney disease as well as chronic and stable medical conditions including hypertension, diabetes mellitus, stage III chronic kidney disease as well as acute on chronic anemia.  Ultimately, she has progressed well enough with therapy to be suitable for discharge home from the hospital to a skilled nursing facility.  We appreciate the hospitalist input throughout this admission..    Diet: Diabetic diet Activity:PWB Follow-up:in 2 weeks Disposition - Skilled nursing facility Discharged Condition: good    Allergies as of 05/23/2020      Reactions   Penicillins Diarrhea, Other (See Comments)   Severe diarrhea Has patient had a PCN reaction causing immediate rash, facial/tongue/throat swelling, SOB or lightheadedness with hypotension: No Has patient had a PCN reaction causing severe rash involving mucus membranes or skin necrosis: No Has patient had a PCN reaction that required hospitalization No Has patient had a PCN reaction occurring within the last 10 years: No If all of the above answers are "NO", then may proceed with Cephalosporin use.   Codeine Other (See Comments)   Unknown   Adhesive [tape] Rash   Aspirin Other (See Comments)   Stomach burning   Iodine Itching, Swelling, Rash, Other (See Comments)   Can use if Benadryl and Prednisone are used first    Ivp Dye [iodinated Diagnostic Agents] Other (See Comments)   Can use if Benadryl and Prednisone are used first    Sulfa Antibiotics Diarrhea      Medication List    STOP taking these medications   acetaminophen 650 MG  CR tablet Commonly known as: TYLENOL Replaced by: acetaminophen 500 MG tablet   irbesartan 300 MG tablet Commonly known as: AVAPRO   TRESIBA FLEXTOUCH [place]     TAKE these medications   Accu-Chek Aviva Plus test strip Generic  drug: glucose blood daily   Accu-Chek Aviva Plus test strip Generic drug: glucose blood TK TO TEST BLOOD SUGAR QD   Accu-Chek Aviva Plus test strip Generic drug: glucose blood   Accu-Chek Aviva Plus w/Device Kit   Accu-Chek Softclix Lancets lancets   acetaminophen 500 MG tablet Commonly known as: TYLENOL Take 2 tablets (1,000 mg total) by mouth 3 (three) times daily. Replaces: acetaminophen 650 MG CR tablet   apixaban 2.5 MG Tabs tablet Commonly known as: Eliquis Take 1 tablet (2.5 mg total) by mouth 2 (two) times daily for 28 days.   B-D UF III MINI PEN NEEDLES 31G X 5 MM Misc Generic drug: Insulin Pen Needle   cyclobenzaprine 10 MG tablet Commonly known as: FLEXERIL Take 10 mg by mouth 3 (three) times daily as needed for muscle spasms.   docusate sodium 100 MG capsule Commonly known as: COLACE Take 1 capsule (100 mg total) by mouth 2 (two) times daily.   doxylamine (Sleep) 25 MG tablet Commonly known as: UNISOM Take 25 mg by mouth at bedtime.   DULoxetine 20 MG capsule Commonly known as: CYMBALTA Take 20 mg by mouth at bedtime.   empagliflozin 10 MG Tabs tablet Commonly known as: Jardiance Take 1 tablet (10 mg total) by mouth daily before breakfast.   gabapentin 300 MG capsule Commonly known as: NEURONTIN Take 300 mg by mouth at bedtime.   hydrALAZINE 100 MG tablet Commonly known as: APRESOLINE Take 1 tablet (100 mg total) by mouth 3 (three) times daily.   insulin aspart 100 UNIT/ML injection Commonly known as: novoLOG Inject 0-15 Units into the skin 3 (three) times daily with meals.   insulin aspart 100 UNIT/ML injection Commonly known as: novoLOG Inject 0-5 Units into the skin at bedtime.   insulin glargine 100 UNIT/ML injection Commonly known as: LANTUS Inject 0.5 mLs (50 Units total) into the skin daily.   labetalol 200 MG tablet Commonly known as: NORMODYNE TAKE 1 TABLET(200 MG) BY MOUTH TWICE DAILY What changed: See the new instructions.     omeprazole 20 MG tablet Commonly known as: PRILOSEC OTC Take 20 mg by mouth daily.   polyethylene glycol 17 g packet Commonly known as: MIRALAX / GLYCOLAX Take 17 g by mouth daily as needed for moderate constipation.   pramipexole 0.125 MG tablet Commonly known as: MIRAPEX Take 0.25 mg by mouth 2 (two) times daily.   rosuvastatin 10 MG tablet Commonly known as: CRESTOR Take 10 mg by mouth daily.   torsemide 20 MG tablet Commonly known as: DEMADEX Take 2 tablets (40 mg total) by mouth daily. Take extra at 2pm if weight is 216 or greater   traMADol 50 MG tablet Commonly known as: ULTRAM Take 1-2 tablets (50-100 mg total) by mouth every 6 (six) hours as needed for moderate pain. What changed:   how much to take  when to take this   traMADol 50 MG tablet Commonly known as: ULTRAM Take 2 tablets (100 mg total) by mouth 3 (three) times daily as needed for moderate pain or severe pain (1 tablet for moderate, 2 for severe). What changed: You were already taking a medication with the same name, and this prescription was added. Make sure you understand how and when to take  each.   Vitamin D 50 MCG (2000 UT) tablet Take 2,000 Units by mouth daily.       Follow-up Information    Nicholes Stairs, MD In 2 weeks.   Specialty: Orthopedic Surgery Why: For suture removal, For wound re-check Contact information: [address] [place] [address] [phone]        Pomposini, [name], MD Follow up in 1 week(s).   Specialty: Internal Medicine       Sueanne Margarita, MD .   Specialty: Cardiology Contact information: [address] [place] [address] [phone]               Signed: Geralynn Rile, MD Orthopaedic Surgery 05/23/2020, 12:27 PM

## 2020-05-24 LAB — GLUCOSE, CAPILLARY
Glucose-Capillary: 102 mg/dL — ABNORMAL HIGH (ref 70–99)
Glucose-Capillary: 193 mg/dL — ABNORMAL HIGH (ref 70–99)

## 2020-05-24 NOTE — Progress Notes (Signed)
Discharge summary packet/pertinent documents provided to [facility]. Pt alert/oriented in no apparent distress.D/C to [facility] as ordered.

## 2020-05-24 NOTE — Progress Notes (Signed)
Report given to [place], staff nurse at [place], [place] and concerns were fully answered.

## 2020-05-24 NOTE — Progress Notes (Signed)
Patient was held overnight in hospital awaiting bed to come available at [facility].  No changes from prior d/c summary.  At rehab continue to play close attention to blood pressure and blood sugars. Eulogio Bear DO

## 2020-05-24 NOTE — TOC Transition Note (Signed)
Transition of Care [facility]) - CM/SW Discharge Note   Patient Details  Name: [name] MRN: [id number] Date of Birth: 1941/07/31  Transition of Care [facility]) CM/SW Contact:  Sharin Mons, RN Phone Number: 05/24/2020, 12:45 PM   Clinical Narrative:     Patient will DC to: [facility]. Anticipated DC date: 05/24/2020 Family notified: daughter Transport by: [facility]          S/p Right tibia fracture repair, 9/9  Per MD patient ready for DC today.RN, patient, patient's family, and facility notified of DC. Discharge Summary and FL2 sent to facility. RN to call report prior to discharge [phone]). DC packet on chart. Ambulance transport requested for patient.   RNCM will sign off for now as intervention is no longer needed. Please consult [place] again if new needs arise.   Final next level of care: [facility].) Barriers to Discharge: No Barriers Identified   Patient Goals and CMS Choice Patient states their goals for this hospitalization and ongoing recovery are:: HOPING TO GET LEG FIXED AND TO [place] AGAIN      Discharge Placement                       Discharge Plan and Services   Discharge Planning Services: CM Consult                                 Social Determinants of Health (SDOH) Interventions     Readmission Risk Interventions No flowsheet data found.

## 2020-06-14 ENCOUNTER — Ambulatory Visit (INDEPENDENT_AMBULATORY_CARE_PROVIDER_SITE_OTHER): Payer: Medicare PPO

## 2020-06-14 DIAGNOSIS — I495 Sick sinus syndrome: Secondary | ICD-10-CM

## 2020-06-16 LAB — CUP PACEART REMOTE DEVICE CHECK
Battery Remaining Longevity: 67 mo
Battery Voltage: 3 V
Brady Statistic AP VP Percent: 0.13 %
Brady Statistic AP VS Percent: 99.34 %
Brady Statistic AS VP Percent: 0 %
Brady Statistic AS VS Percent: 0.53 %
Brady Statistic RA Percent Paced: 99.34 %
Brady Statistic RV Percent Paced: 0.13 %
Date Time Interrogation Session: 20211006115342
Implantable Lead Implant Date: 20161110
Implantable Lead Implant Date: 20161110
Implantable Lead Location: 753859
Implantable Lead Location: 753860
Implantable Lead Model: 5076
Implantable Lead Model: 5076
Implantable Pulse Generator Implant Date: 20161110
Lead Channel Impedance Value: 361 Ohm
Lead Channel Impedance Value: 380 Ohm
Lead Channel Impedance Value: 399 Ohm
Lead Channel Impedance Value: 437 Ohm
Lead Channel Pacing Threshold Amplitude: 0.625 V
Lead Channel Pacing Threshold Amplitude: 1.625 V
Lead Channel Pacing Threshold Pulse Width: 0.4 ms
Lead Channel Pacing Threshold Pulse Width: 0.4 ms
Lead Channel Sensing Intrinsic Amplitude: 4 mV
Lead Channel Sensing Intrinsic Amplitude: 4 mV
Lead Channel Sensing Intrinsic Amplitude: 6.25 mV
Lead Channel Sensing Intrinsic Amplitude: 6.25 mV
Lead Channel Setting Pacing Amplitude: 1.5 V
Lead Channel Setting Pacing Amplitude: 3.5 V
Lead Channel Setting Pacing Pulse Width: 0.4 ms
Lead Channel Setting Sensing Sensitivity: 2.8 mV

## 2020-06-17 NOTE — Progress Notes (Signed)
Remote pacemaker transmission.   

## 2020-07-26 ENCOUNTER — Other Ambulatory Visit: Payer: Self-pay

## 2020-07-26 ENCOUNTER — Encounter (HOSPITAL_BASED_OUTPATIENT_CLINIC_OR_DEPARTMENT_OTHER): Payer: Medicare PPO | Attending: Internal Medicine | Admitting: Internal Medicine

## 2020-07-26 DIAGNOSIS — Z85038 Personal history of other malignant neoplasm of large intestine: Secondary | ICD-10-CM | POA: Insufficient documentation

## 2020-07-26 DIAGNOSIS — Z950 Presence of cardiac pacemaker: Secondary | ICD-10-CM | POA: Diagnosis not present

## 2020-07-26 DIAGNOSIS — E1142 Type 2 diabetes mellitus with diabetic polyneuropathy: Secondary | ICD-10-CM | POA: Insufficient documentation

## 2020-07-26 DIAGNOSIS — L97811 Non-pressure chronic ulcer of other part of right lower leg limited to breakdown of skin: Secondary | ICD-10-CM | POA: Insufficient documentation

## 2020-07-26 DIAGNOSIS — I5032 Chronic diastolic (congestive) heart failure: Secondary | ICD-10-CM | POA: Insufficient documentation

## 2020-07-26 DIAGNOSIS — K219 Gastro-esophageal reflux disease without esophagitis: Secondary | ICD-10-CM | POA: Diagnosis not present

## 2020-07-26 DIAGNOSIS — I130 Hypertensive heart and chronic kidney disease with heart failure and stage 1 through stage 4 chronic kidney disease, or unspecified chronic kidney disease: Secondary | ICD-10-CM | POA: Diagnosis not present

## 2020-07-26 DIAGNOSIS — E1151 Type 2 diabetes mellitus with diabetic peripheral angiopathy without gangrene: Secondary | ICD-10-CM | POA: Insufficient documentation

## 2020-07-26 DIAGNOSIS — G4733 Obstructive sleep apnea (adult) (pediatric): Secondary | ICD-10-CM | POA: Insufficient documentation

## 2020-07-26 DIAGNOSIS — E1122 Type 2 diabetes mellitus with diabetic chronic kidney disease: Secondary | ICD-10-CM | POA: Insufficient documentation

## 2020-07-26 DIAGNOSIS — N183 Chronic kidney disease, stage 3 unspecified: Secondary | ICD-10-CM | POA: Diagnosis not present

## 2020-07-26 DIAGNOSIS — E11622 Type 2 diabetes mellitus with other skin ulcer: Secondary | ICD-10-CM | POA: Diagnosis present

## 2020-07-26 DIAGNOSIS — I890 Lymphedema, not elsewhere classified: Secondary | ICD-10-CM | POA: Insufficient documentation

## 2020-07-26 DIAGNOSIS — M48061 Spinal stenosis, lumbar region without neurogenic claudication: Secondary | ICD-10-CM | POA: Diagnosis not present

## 2020-07-26 NOTE — Progress Notes (Signed)
[name], [name] ([id number]) Visit Report for 07/26/2020 Abuse/Suicide Risk Screen Details Patient Name: Date of Service: [name], [name] 07/26/2020 2:45 PM Medical Record Number: [id number] Patient Account Number: [id number] Date of Birth/Sex: Treating RN: [birth year] (79 y.o. [name] Primary Care Lavene Penagos: Margaretha Sheffield Other Clinician: Referring Sejla Marzano: Treating Glenroy Crossen/Extender: Benedetto Coons in Treatment: 0 Abuse/Suicide Risk Screen Items Answer ABUSE RISK SCREEN: Has anyone close to you tried to hurt or harm you recentlyo No Do you feel uncomfortable with anyone in your familyo No Has anyone forced you do things that you didnt want to doo No Electronic Signature(s) Signed: 07/26/2020 5:21:16 PM By: Baruch Gouty RN, BSN Entered By: Baruch Gouty on 07/26/2020 15:40:27 -------------------------------------------------------------------------------- Activities of Daily Living Details Patient Name: Date of Service: [name], [name] 07/26/2020 2:45 PM Medical Record Number: [id number] Patient Account Number: [id number] Date of Birth/Sex: Treating RN: 02/26/1941 (79 y.o. [name] Primary Care Dayveon Halley: Margaretha Sheffield Other Clinician: Referring Dima Ferrufino: Treating Abella Shugart/Extender: Benedetto Coons in Treatment: 0 Activities of Daily Living Items Answer Activities of Daily Living (Please select one for each item) Drive Automobile Completely Able T Medications ake Completely Able Use T elephone Completely Able Care for Appearance Completely Able Use T oilet Completely Able Bath / Shower Completely Able Dress Self Completely Able Feed Self Completely Able Walk Completely Able Get In / Out Bed Completely Able Housework Completely Able Prepare Meals Completely [place] for Self Completely Able Electronic Signature(s) Signed: 07/26/2020 5:21:16 PM By: Baruch Gouty RN, BSN Entered By: Baruch Gouty on 07/26/2020 15:40:50 -------------------------------------------------------------------------------- Education Screening Details Patient Name: Date of Service: [name], [name] 07/26/2020 2:45 PM Medical Record Number: [id number] Patient Account Number: [id number] Date of Birth/Sex: Treating RN: 12/11/1940 (79 y.o. [name] Primary Care Kingston Shawgo: Margaretha Sheffield Other Clinician: Referring Deontray Hunnicutt: Treating Ramah Langhans/Extender: Benedetto Coons in Treatment: 0 Primary Learner Assessed: Patient Learning Preferences/Education Level/Primary Language Learning Preference: Explanation, Demonstration, Printed Material Highest Education Level: High School Preferred Language: English Cognitive Barrier Language Barrier: No Translator Needed: No Memory Deficit: No Emotional Barrier: No Cultural/Religious Beliefs Affecting Medical Care: No Physical Barrier Impaired Vision: Yes Glasses Impaired Hearing: No Decreased Hand dexterity: No Knowledge/Comprehension Knowledge Level: High Comprehension Level: High Ability to understand written instructions: High Ability to understand verbal instructions: High Motivation Anxiety Level: Calm Cooperation: Cooperative Education Importance: Acknowledges Need Interest in Health Problems: Asks Questions Perception: Coherent Willingness to Engage in Self-Management High Activities: Readiness to Engage in Self-Management High Activities: Electronic Signature(s) Signed: 07/26/2020 5:21:16 PM By: Baruch Gouty RN, BSN Entered By: Baruch Gouty on 07/26/2020 15:41:24 -------------------------------------------------------------------------------- Fall Risk Assessment Details Patient Name: Date of Service: [name], [name] 07/26/2020 2:45 PM Medical Record Number: [id number] Patient Account Number: [id number] Date of Birth/Sex: Treating RN: 1940-12-19 (79 y.o. [name] Primary Care Tila Millirons: Margaretha Sheffield Other Clinician: Referring Sakiyah Shur: Treating Shrita Thien/Extender: Benedetto Coons in Treatment: 0 Fall Risk Assessment Items Have you had 2 or more falls in the last 12 monthso 0 Yes Have you had any fall that resulted in injury in the last 12 monthso 0 Yes FALLS RISK SCREEN History of falling - immediate or within 3 months 25 Yes Secondary diagnosis (Do you have 2 or more medical diagnoseso) 0 No Ambulatory aid None/bed rest/wheelchair/nurse 0 Yes Crutches/cane/walker 0 No Furniture 0 No Intravenous therapy Access/Saline/Heparin Lock 0 No Gait/Transferring Normal/ bed rest/ wheelchair 0 Yes Weak (short steps with or without shuffle, stooped  but able to lift head while walking, may seek 0 No support from furniture) Impaired (short steps with shuffle, may have difficulty arising from chair, head down, impaired 0 No balance) Mental Status Oriented to own ability 0 Yes Electronic Signature(s) Signed: 07/26/2020 5:21:16 PM By: Baruch Gouty RN, BSN Entered By: Baruch Gouty on 07/26/2020 15:41:56 -------------------------------------------------------------------------------- Foot Assessment Details Patient Name: Date of Service: [name], [name] 07/26/2020 2:45 PM Medical Record Number: [id number] Patient Account Number: [id number] Date of Birth/Sex: Treating RN: Apr 05, 1941 (79 y.o. [name] Primary Care Esdras Delair: Margaretha Sheffield Other Clinician: Referring Estreya Clay: Treating Darrielle Pflieger/Extender: Benedetto Coons in Treatment: 0 Foot Assessment Items Site Locations + = Sensation present, - = Sensation absent, C = Callus, U = Ulcer R = Redness, W = Warmth, M = Maceration, PU = Pre-ulcerative lesion F = Fissure, S = Swelling, D = Dryness Assessment Right: Left: Other Deformity: No No Prior Foot Ulcer: No No Prior Amputation: No No Charcot Joint: No  No Ambulatory Status: Ambulatory Without Help Gait: Steady Electronic Signature(s) Signed: 07/26/2020 5:21:16 PM By: Baruch Gouty RN, BSN Entered By: Baruch Gouty on 07/26/2020 15:42:55 -------------------------------------------------------------------------------- Nutrition Risk Screening Details Patient Name: Date of Service: [name], [name] 07/26/2020 2:45 PM Medical Record Number: [id number] Patient Account Number: [id number] Date of Birth/Sex: Treating RN: 10/04/1940 (79 y.o. [name] Primary Care Aimi Essner: Margaretha Sheffield Other Clinician: Referring Raedyn Klinck: Treating Pepper Kerrick/Extender: Benedetto Coons in Treatment: 0 Height (in): 63 Weight (lbs): 200 Body Mass Index (BMI): 35.4 Nutrition Risk Screening Items Score Screening NUTRITION RISK SCREEN: I have an illness or condition that made me change the kind and/or amount of food I eat 0 No I eat fewer than two meals per day 0 No I eat few fruits and vegetables, or milk products 0 No I have three or more drinks of beer, liquor or wine almost every day 0 No I have tooth or mouth problems that make it hard for me to eat 0 No I don't always have enough money to buy the food I need 0 No I eat alone most of the time 1 Yes I take three or more different prescribed or over-the-counter drugs a day 1 Yes Without wanting to, I have lost or gained 10 pounds in the last six months 0 No I am not always physically able to shop, cook and/or feed myself 0 No Nutrition Protocols Good Risk Protocol 0 No interventions needed Moderate Risk Protocol High Risk Proctocol Risk Level: Good Risk Score: 2 Electronic Signature(s) Signed: 07/26/2020 5:21:16 PM By: Baruch Gouty RN, BSN Entered By: Baruch Gouty on 07/26/2020 15:42:37

## 2020-07-27 NOTE — Progress Notes (Signed)
KEMANI, HEIDEL ([id number]) Visit Report for 07/26/2020 Allergy List Details Patient Name: Date of Service: FUJIE, DICKISON 07/26/2020 2:45 PM Medical Record Number: [id number] Patient Account Number: [id number] Date of Birth/Sex: Treating RN: 29-Sep-1940 (79 y.o. Elam Dutch Primary Care Jearld Hemp: Margaretha Sheffield Other Clinician: Referring Shirlene Andaya: Treating Adalyna Godbee/Extender: Benedetto Coons in Treatment: 0 Allergies Active Allergies penicillin codeine adhesive aspirin iodine Iodinated Contrast Media Sulfa (Sulfonamide Antibiotics) Allergy Notes Electronic Signature(s) Signed: 07/26/2020 5:21:16 PM By: Baruch Gouty RN, BSN Entered By: Baruch Gouty on 07/26/2020 15:30:44 -------------------------------------------------------------------------------- Arrival Information Details Patient Name: Date of Service: SAHIRA, CATALDI 07/26/2020 2:45 PM Medical Record Number: [id number] Patient Account Number: [id number] Date of Birth/Sex: Treating RN: [birth year] (79 y.o. Elam Dutch Primary Care Garhett Bernhard: Margaretha Sheffield Other Clinician: Referring Heber Hoog: Treating Harnoor Kohles/Extender: Benedetto Coons in Treatment: 0 Visit Information Patient Arrived: Ambulatory Arrival Time: 15:23 Accompanied By: self Transfer Assistance: None Patient Identification Verified: Yes Secondary Verification Process Completed: Yes Patient Requires Transmission-Based Precautions: No Patient Has Alerts: Yes Patient Alerts: R ABI N/C History Since Last Visit Had a fall or experienced change in activities of daily living that may affect risk of falls: Yes Signs or symptoms of abuse/neglect since last visito No Hospitalized since last visit: Yes Implantable device outside of the clinic excluding cellular tissue based products placed in the center since last visit: No Electronic Signature(s) Signed: 07/26/2020 5:21:16 PM By: Baruch Gouty RN, BSN Signed: 07/26/2020 5:21:16 PM By: Baruch Gouty RN, BSN Entered By: Baruch Gouty on 07/26/2020 15:52:57 -------------------------------------------------------------------------------- Clinic Level of Care Assessment Details Patient Name: Date of Service: TAKYLA, KUCHERA 07/26/2020 2:45 PM Medical Record Number: [id number] Patient Account Number: [id number] Date of Birth/Sex: Treating RN: 02-14-1941 (79 y.o. [name] Primary Care Lyn Deemer: Margaretha Sheffield Other Clinician: Referring Aradia Estey: Treating Johna Kearl/Extender: Benedetto Coons in Treatment: 0 Clinic Level of Care Assessment Items TOOL 1 Quantity Score X- 1 0 Use when EandM and Procedure is performed on INITIAL visit ASSESSMENTS - Nursing Assessment / Reassessment X- 1 20 General Physical Exam (combine w/ comprehensive assessment (listed just below) when performed on new pt. evals) X- 1 25 Comprehensive Assessment (HX, ROS, Risk Assessments, Wounds Hx, etc.) ASSESSMENTS - Wound and Skin Assessment / Reassessment []  - 0 Dermatologic / Skin Assessment (not related to wound area) ASSESSMENTS - Ostomy and/or Continence Assessment and Care []  - 0 Incontinence Assessment and Management []  - 0 Ostomy Care Assessment and Management (repouching, etc.) PROCESS - Coordination of Care X - Simple Patient / Family Education for ongoing care 1 15 []  - 0 Complex (extensive) Patient / Family Education for ongoing care X- 1 10 Staff obtains Programmer, systems, Records, T Results / Process Orders est []  - 0 Staff telephones HHA, Nursing Homes / Clarify orders / etc []  - 0 Routine Transfer to another Facility (non-emergent condition) []  - 0 Routine Hospital Admission (non-emergent condition) X- 1 15 New Admissions / Biomedical engineer / Ordering NPWT Apligraf, etc. , []  - 0 Emergency Hospital Admission (emergent condition) PROCESS - Special Needs []  - 0 Pediatric / Minor Patient  Management []  - 0 Isolation Patient Management []  - 0 Hearing / Language / Visual special needs []  - 0 Assessment of Community assistance (transportation, D/C planning, etc.) []  - 0 Additional assistance / Altered mentation []  - 0 Support Surface(s) Assessment (bed, cushion, seat, etc.) INTERVENTIONS - Miscellaneous []  - 0 External ear exam []  - 0 Patient Transfer (multiple staff / Civil Service fast streamer /  Similar devices) []  - 0 Simple Staple / Suture removal (25 or less) []  - 0 Complex Staple / Suture removal (26 or more) []  - 0 Hypo/Hyperglycemic Management (do not check if billed separately) X- 1 15 Ankle / Brachial Index (ABI) - do not check if billed separately Has the patient been seen at the hospital within the last three years: Yes Total Score: 100 Level Of Care: New/Established - Level 3 Electronic Signature(s) Signed: 07/26/2020 5:00:30 PM By: Carlene Coria RN Entered By: Carlene Coria on 07/26/2020 16:16:26 -------------------------------------------------------------------------------- Encounter Discharge Information Details Patient Name: Date of Service: SHANEA, KARNEY 07/26/2020 2:45 PM Medical Record Number: [id number] Patient Account Number: [id number] Date of Birth/Sex: Treating RN: Sep 11, 1940 (79 y.o. [name] Primary Care Lutisha Knoche: Margaretha Sheffield Other Clinician: Referring Lizzeth Meder: Treating Marbella Markgraf/Extender: Benedetto Coons in Treatment: 0 Encounter Discharge Information Items Discharge Condition: Stable Ambulatory Status: Ambulatory Discharge Destination: Home Transportation: Private Auto Accompanied By: daughter Schedule Follow-up Appointment: Yes Clinical Summary of Care: Patient Declined Electronic Signature(s) Signed: 07/27/2020 5:17:00 PM By: Levan Hurst RN, BSN Entered By: Levan Hurst on 07/26/2020 16:35:16 -------------------------------------------------------------------------------- Lower Extremity  Assessment Details Patient Name: Date of Service: ATHEENA, SPANO 07/26/2020 2:45 PM Medical Record Number: [id number] Patient Account Number: [id number] Date of Birth/Sex: Treating RN: 02-Nov-1940 (79 y.o. Elam Dutch Primary Care Lylliana Kitamura: Margaretha Sheffield Other Clinician: Referring Kella Splinter: Treating Fidencio Duddy/Extender: Benedetto Coons in Treatment: 0 Edema Assessment Assessed: Shirlyn Goltz: No] Patrice Paradise: No] Edema: [Left: Ye] [Right: s] Calf Left: Right: Point of Measurement: From Medial Instep 41 cm Ankle Left: Right: Point of Measurement: From Medial Instep 26.5 cm Vascular Assessment Pulses: Dorsalis Pedis Palpable: [Right:Yes] Notes right DP pulse noncompressible Electronic Signature(s) Signed: 07/26/2020 5:21:16 PM By: Baruch Gouty RN, BSN Entered By: Baruch Gouty on 07/26/2020 15:51:40 -------------------------------------------------------------------------------- Multi Wound Chart Details Patient Name: Date of Service: [name] 07/26/2020 2:45 PM Medical Record Number: [id number] Patient Account Number: [id number] Date of Birth/Sex: Treating RN: July 14, 1941 (79 y.o. [name] Primary Care Reubin Bushnell: Margaretha Sheffield Other Clinician: Referring Nema Oatley: Treating Edessa Jakubowicz/Extender: Benedetto Coons in Treatment: 0 Vital Signs Height(in): 63 Pulse(bpm): 75 Weight(lbs): 200 Blood Pressure(mmHg): 149/74 Body Mass Index(BMI): 35 Temperature(F): 98.4 Respiratory Rate(breaths/min): 18 Photos: [3:No Photos Right, Medial Lower Leg] [4:No Photos Right, Proximal, Medial Lower Leg] [N/A:N/A N/A] Wound Location: [3:Blister] [4:Blister] [N/A:N/A] Wounding Event: [3:Venous Leg Ulcer] [4:Venous Leg Ulcer] [N/A:N/A] Primary Etiology: [3:Diabetic Wound/Ulcer of the Lower] [4:Diabetic Wound/Ulcer of the Lower] [N/A:N/A] Secondary Etiology: [3:Extremity Lymphedema, Congestive Heart] [4:Extremity Lymphedema,  Congestive Heart] [N/A:N/A] Comorbid History: [3:Failure, Hypertension, Peripheral Venous Disease, Type II Diabetes, Osteoarthritis, Neuropathy, Confinement Anxiety 07/12/2020] [4:Failure, Hypertension, Peripheral Venous Disease, Type II Diabetes, Osteoarthritis, Neuropathy,  Confinement Anxiety 07/11/2020] [N/A:N/A] Date Acquired: [3:0] [4:0] [N/A:N/A] Weeks of Treatment: [3:Open] [4:Open] [N/A:N/A] Wound Status: [3:1.5x1x0.1] [4:0.7x1.8x0.1] [N/A:N/A] Measurements L x W x D (cm) [3:1.178] [4:0.99] [N/A:N/A] A (cm) : rea [3:0.118] [4:0.099] [N/A:N/A] Volume (cm) : [3:Full Thickness Without Exposed] [4:Full Thickness Without Exposed] [N/A:N/A] Classification: [3:Support Structures Small] [4:Support Structures Small] [N/A:N/A] Exudate Amount: [3:Serous] [4:Serous] [N/A:N/A] Exudate Type: [3:amber] [4:amber] [N/A:N/A] Exudate Color: [3:Flat and Intact] [4:Flat and Intact] [N/A:N/A] Wound Margin: [3:Large (67-100%)] [4:Large (67-100%)] [N/A:N/A] Granulation Amount: [3:Red] [4:Red] [N/A:N/A] Granulation Quality: [3:None Present (0%)] [4:None Present (0%)] [N/A:N/A] Necrotic Amount: [3:Fat Layer (Subcutaneous Tissue): Yes Fat Layer (Subcutaneous Tissue): Yes N/A] Exposed Structures: [3:Fascia: No Tendon: No Muscle: No Joint: No Bone: No Medium (34-66%)] [4:Fascia: No Tendon: No Muscle: No Joint: No Bone: No  Small (1-33%)] [N/A:N/A] Treatment Notes Electronic Signature(s) Signed: 07/26/2020 4:54:15 PM By: Linton Ham MD Signed: 07/26/2020 5:00:30 PM By: Carlene Coria RN Entered By: Linton Ham on 07/26/2020 16:24:27 -------------------------------------------------------------------------------- Multi-Disciplinary Care Plan Details Patient Name: Date of Service: LYSANDRA, LOUGHMILLER 07/26/2020 2:45 PM Medical Record Number: [id number] Patient Account Number: [id number] Date of Birth/Sex: Treating RN: November 07, 1940 (79 y.o. [name] Primary Care Kemal Amores: Margaretha Sheffield Other  Clinician: Referring Marthe Dant: Treating Estefanny Moler/Extender: Benedetto Coons in Treatment: 0 Active Inactive Wound/Skin Impairment Nursing Diagnoses: Knowledge deficit related to ulceration/compromised skin integrity Goals: Patient/caregiver will verbalize understanding of skin care regimen Date Initiated: 07/26/2020 Target Resolution Date: 08/25/2020 Goal Status: Active Ulcer/skin breakdown will have a volume reduction of 30% by week 4 Date Initiated: 07/26/2020 Target Resolution Date: 08/25/2020 Goal Status: Active Interventions: Assess patient/caregiver ability to obtain necessary supplies Assess patient/caregiver ability to perform ulcer/skin care regimen upon admission and as needed Assess ulceration(s) every visit Notes: Electronic Signature(s) Signed: 07/26/2020 5:00:30 PM By: Carlene Coria RN Entered By: Carlene Coria on 07/26/2020 16:14:39 -------------------------------------------------------------------------------- Pain Assessment Details Patient Name: Date of Service: MCKAELA, HOWLEY 07/26/2020 2:45 PM Medical Record Number: [id number] Patient Account Number: [id number] Date of Birth/Sex: Treating RN: 07-04-1941 (79 y.o. Elam Dutch Primary Care Barrett Holthaus: Margaretha Sheffield Other Clinician: Referring Kenyon Eshleman: Treating Jannelly Bergren/Extender: Benedetto Coons in Treatment: 0 Active Problems Location of Pain Severity and Description of Pain Patient Has Paino Yes Site Locations Pain Location: Pain Location: Generalized Pain With Dressing Change: No Duration of the Pain. Constant / Intermittento Constant Rate the pain. Current Pain Level: 0 Worst Pain Level: 9 Least Pain Level: 0 Character of Pain Describe the Pain: Aching, Other: with movement Pain Management and Medication Current Pain Management: Medication: Yes Is the Current Pain Management Adequate: Adequate How does your wound impact your  activities of daily livingo Sleep: No Bathing: No Appetite: No Relationship With Others: No Bladder Continence: No Emotions: No Bowel Continence: No Work: No Toileting: No Drive: No Dressing: No Hobbies: No Notes c/o of right upper leg pain with movement Electronic Signature(s) Signed: 07/26/2020 5:21:16 PM By: Baruch Gouty RN, BSN Entered By: Baruch Gouty on 07/26/2020 15:49:50 -------------------------------------------------------------------------------- Patient/Caregiver Education Details Patient Name: Date of Service: [name] 11/16/2021andnbsp2:45 PM Medical Record Number: [id number] Patient Account Number: [id number] Date of Birth/Gender: Treating RN: April 13, 1941 (79 y.o. [name] Primary Care Physician: Margaretha Sheffield Other Clinician: Referring Physician: Treating Physician/Extender: Benedetto Coons in Treatment: 0 Education Assessment Education Provided To: Patient Education Topics Provided Wound/Skin Impairment: Methods: Explain/Verbal Responses: State content correctly Electronic Signature(s) Signed: 07/26/2020 5:00:30 PM By: Carlene Coria RN Entered By: Carlene Coria on 07/26/2020 16:14:52 -------------------------------------------------------------------------------- Wound Assessment Details Patient Name: Date of Service: KOURTLYNN, TREVOR 07/26/2020 2:45 PM Medical Record Number: [id number] Patient Account Number: [id number] Date of Birth/Sex: Treating RN: 1940/10/25 (79 y.o. Elam Dutch Primary Care Dhalia Zingaro: Margaretha Sheffield Other Clinician: Referring Arfa Lamarca: Treating Byrdie Miyazaki/Extender: Benedetto Coons in Treatment: 0 Wound Status Wound Number: 3 Primary Venous Leg Ulcer Etiology: Wound Location: Right, Medial Lower Leg Secondary Diabetic Wound/Ulcer of the Lower Extremity Wounding Event: Blister Etiology: Date Acquired: 07/12/2020 Wound Open Weeks Of Treatment:  0 Status: Clustered Wound: No Comorbid Lymphedema, Congestive Heart Failure, Hypertension, Peripheral History: Venous Disease, Type II Diabetes, Osteoarthritis, Neuropathy, Confinement Anxiety Wound Measurements Length: (cm) 1.5 Width: (cm) 1 Depth: (cm) 0.1 Area: (cm) 1.178 Volume: (cm) 0.118 % Reduction in Area: % Reduction in Volume: Epithelialization: Medium (  34-66%) Tunneling: No Undermining: No Wound Description Classification: Full Thickness Without Exposed Support Structures Wound Margin: Flat and Intact Exudate Amount: Small Exudate Type: Serous Exudate Color: amber Foul Odor After Cleansing: No Slough/Fibrino No Wound Bed Granulation Amount: Large (67-100%) Exposed Structure Granulation Quality: Red Fascia Exposed: No Necrotic Amount: None Present (0%) Fat Layer (Subcutaneous Tissue) Exposed: Yes Tendon Exposed: No Muscle Exposed: No Joint Exposed: No Bone Exposed: No Treatment Notes Wound #3 (Right, Medial Lower Leg) 1. Cleanse With Wound Cleanser 2. Periwound Care TCA Cream 3. Primary Dressing Applied Hydrofera Blue 4. Secondary Dressing ABD Pad Dry Gauze 6. Support Layer Applied 3 layer compression Water quality scientist) Signed: 07/26/2020 5:21:16 PM By: Baruch Gouty RN, BSN Entered By: Baruch Gouty on 07/26/2020 15:45:21 -------------------------------------------------------------------------------- Wound Assessment Details Patient Name: Date of Service: DIANNA, EWALD 07/26/2020 2:45 PM Medical Record Number: [id number] Patient Account Number: [id number] Date of Birth/Sex: Treating RN: 09/09/1941 (79 y.o. Elam Dutch Primary Care Ka Bench: Margaretha Sheffield Other Clinician: Referring Clydine Parkison: Treating Scottie Stanish/Extender: Benedetto Coons in Treatment: 0 Wound Status Wound Number: 4 Primary Venous Leg Ulcer Etiology: Wound Location: Right, Proximal, Medial Lower Leg Secondary Diabetic  Wound/Ulcer of the Lower Extremity Wounding Event: Blister Etiology: Date Acquired: 07/11/2020 Wound Open Weeks Of Treatment: 0 Status: Clustered Wound: No Comorbid Lymphedema, Congestive Heart Failure, Hypertension, Peripheral History: Venous Disease, Type II Diabetes, Osteoarthritis, Neuropathy, Confinement Anxiety Wound Measurements Length: (cm) 0.7 Width: (cm) 1.8 Depth: (cm) 0.1 Area: (cm) 0.99 Volume: (cm) 0.099 % Reduction in Area: % Reduction in Volume: Epithelialization: Small (1-33%) Tunneling: No Undermining: No Wound Description Classification: Full Thickness Without Exposed Support Structures Wound Margin: Flat and Intact Exudate Amount: Small Exudate Type: Serous Exudate Color: amber Foul Odor After Cleansing: No Slough/Fibrino No Wound Bed Granulation Amount: Large (67-100%) Exposed Structure Granulation Quality: Red Fascia Exposed: No Necrotic Amount: None Present (0%) Fat Layer (Subcutaneous Tissue) Exposed: Yes Tendon Exposed: No Muscle Exposed: No Joint Exposed: No Bone Exposed: No Treatment Notes Wound #4 (Right, Proximal, Medial Lower Leg) 1. Cleanse With Wound Cleanser 2. Periwound Care TCA Cream 3. Primary Dressing Applied Hydrofera Blue 4. Secondary Dressing ABD Pad Dry Gauze 6. Support Layer Applied 3 layer compression Water quality scientist) Signed: 07/26/2020 5:21:16 PM By: Baruch Gouty RN, BSN Entered By: Baruch Gouty on 07/26/2020 15:47:08 -------------------------------------------------------------------------------- [place] Details Patient Name: Date of Service: DANAE, OLAND 07/26/2020 2:45 PM Medical Record Number: [id number] Patient Account Number: [id number] Date of Birth/Sex: Treating RN: 1941-01-11 (79 y.o. Elam Dutch Primary Care Anabella Capshaw: Margaretha Sheffield Other Clinician: Referring Camyra Vaeth: Treating Ashlin Hidalgo/Extender: Benedetto Coons in Treatment: 0 Vital  Signs Time Taken: 15:29 Temperature (F): 98.4 Height (in): 63 Pulse (bpm): 75 Source: Stated Respiratory Rate (breaths/min): 18 Weight (lbs): 200 Blood Pressure (mmHg): 149/74 Source: Stated Reference Range: 80 - 120 mg / dl Body Mass Index (BMI): 35.4 Electronic Signature(s) Signed: 07/26/2020 5:21:16 PM By: Baruch Gouty RN, BSN Entered By: Baruch Gouty on 07/26/2020 15:30:25

## 2020-07-28 NOTE — Progress Notes (Signed)
TONA, QUALLEY ([id number]) Visit Report for 07/26/2020 HPI Details Patient Name: Date of Service: [name], [name] 07/26/2020 2:45 PM Medical Record Number: [id number] Patient Account Number: [id number] Date of Birth/Sex: Treating RN: September 24, 1940 (79 y.o. [name] Primary Care Provider: Margaretha Sheffield Other Clinician: Referring Provider: Treating Provider/Extender: Benedetto Coons in Treatment: 0 History of Present Illness HPI Description: ADMISSION 03/28/2020 This is a 79 year old woman who is accompanied by her daughter. She is moving to [place] from [place] where she lives. Infectious been going to the wound care center in [place] for the last 2 months. She apparently did not tolerate compression early on in that clinic stay. She is just using dry gauze over the wounds when she came in today. She tells me she has had both arterial and venous reflux studies although she does not have copies of these. She was apparently offered an ablation but I have no further information on that. She also is a type II diabetic. She was noted to have wounds on her lower extremities during a CHF clinic visit with Dr. Haroldine Laws and she was referred here. She has several scattered areas on the right medial lower leg and a large area of superficial ulceration on the posterior medial left lower leg. past medical history; chronic kidney disease stage III, gastroesophageal reflux disease, type 2 diabetes with neuropathy, chronic diastolic heart failure, obstructive sleep apnea, hypertension, restless leg syndrome, pulmonary venous hypertension, history of colon CA, pacemaker and apparently venous reflux. We did not do arterial studies in the clinic today we are going to try to get the results that were already done within the last 2 months in [place] 7/26; patient readmitted to the clinic last week with predominantly chronic venous wounds almost circumferentially in  the left lower leg and the right leg medially. She also has secondary lymphedema. We put her in compression. She went to the ER in [place] on Friday they remove the wrap on the left leg diagnosed her with cellulitis and put her on doxycycline. They apparently also did a ultrasound that was negative for DVT . We did not get any vascular information from [facility] where she apparently had arterial studies and venous studies. She is currently moving from [place] to [place] is up on her feet quite a bit. She was referred to vein and vascular here but never got to the appointment. ABIs were obtained here at 0.89 on the right and 1.02 on the left 8/9-Patient returns after establishing in clinic last visit, we have been doing 3 layer compression on both sides with calcium alginate, she is following up with the vein and vascular when she gets an appointment. The right side is healed the left side is still got some small open areas She has appt on Wed at Vein and vascular at which point she will come in for Nurse visit here 8/16; the patient arrives with all of her wounds closed. She has bilateral Juzo stockings. She saw Dr. Oneida Alar on 8/11 to review her reflux studies from the same day. On the right she had no evidence of DVT or SVT She did have deep venous reflux in the common femoral vein as well as superficial venous reflux in the . small saphenous vein of the mid calf on the left again no thrombus in the deep vein or superficial veins. Deep vein reflux in the common femoral vein as well as the great saphenous vein. She saw Dr. Oneida Alar in consult. Also of note he  noted that she had a normal arterial scan done in [place] in April 2021, we were never able to obtain this. His feeling was that the patient did have a dilated greater saphenous vein however minimal to no evidence of reflux in her superficial venous system. She also was felt to have mild deep vein reflux and some degree of lymphedema.  He recommended continued compression of the lower extremities with intermittent Unna boots if necessary. She was told to elevate her legs. If she develops recurrent ulcerations they were not opposed to revisiting the repeat venous reflux exam to see if she develops worsening reflux for consideration of laser ablation READMISSION 07/26/2020 Patient is now an 79 year old woman who lives in [place]. We had her in the clinic in the summer with wounds on her bilateral lower legs secondary to chronic venous insufficiency with some degree of lymphedema. She was seen by Dr. Oneida Alar in August. This was predominantly to review her venous reflux. She quoted normal arterial studies done in [place] in April 2021 but I have never been able to see these myself.Marland Kitchen His feeling was the patient did have a bilateral great dilated greater saphenous vein however minimal to no evidence of reflux in her superficial venous system. She was also felt to have mild deep vein reflux and some degree of lymphedema. He recommended compression and leg elevation.Marland Kitchen He was not opposed to repeating her venous reflux studies at some point if she develops worse to see if she develops worsening reflux for consideration of ablation. We discharged the patient with juxta lite stockings bilaterally and she has been compliant with wearing these. Unfortunately in September she had a fall suffering a right tibial fracture requiring an IM nail with a rod. She also had fibular fractures in at least one place. She was sent to skilled facility for rehabilitation she is now back at home. She had a DVT rule out study on 9/24 that was negative for DVT . She tells [place] that in the last 2 or 3 weeks she is developed blisters on her right lower extremity that ruptured into 2 open wounds. She has not been able to get these to close over. She religiously uses her juxta lite stockings on both legs and she gets them on is tight as she can. Originally the left  leg was the larger of the 2 legs but since the patient had surgery the right leg is more swollen and tense than the left. She also tells me she had a second fall yesterday and has had pain in the left dorsal midfoot. She has diabetic neuropathy with Charcot foot left greater than right. Past medical history includes chronic kidney disease, hypertension, type 2 diabetes with peripheral neuropathy, congestive heart failure, hypertension, history of colon CA, lumbar stenosis ABIs in our clinic were noncompressible bilaterally Electronic Signature(s) Signed: 07/26/2020 4:54:15 PM By: Linton Ham MD Entered By: Linton Ham on 07/26/2020 16:29:40 -------------------------------------------------------------------------------- Physical Exam Details Patient Name: Date of Service: [name], [name] 07/26/2020 2:45 PM Medical Record Number: [id number] Patient Account Number: [id number] Date of Birth/Sex: Treating RN: 26-Mar-1941 (79 y.o. [name] Primary Care Provider: Margaretha Sheffield Other Clinician: Referring Provider: Treating Provider/Extender: Benedetto Coons in Treatment: 0 Constitutional Patient is hypertensive.. Pulse regular and within target range for patient.Marland Kitchen Respirations regular, non-labored and within target range.. Temperature is normal and within the target range for the patient.Marland Kitchen Appears in no distress. Eyes Conjunctivae clear. No discharge.no icterus. Cardiovascular Right popliteal pulses palpable. Her pedal  pulses are palpable at the , right dorsalis pedis and right dorsalis and right posterior tibial. Musculoskeletal Charcot feet and ankle especially on the left. She has point tenderness in the left third metatarsal mid aspect. Given her degree of neuropathy I think this is probably significant. Integumentary (Hair, Skin) She has skin changes of chronic venous stasis on the right greater than left lower legs. Psychiatric appears at  normal baseline. Notes Wound exam; she has 2 relatively large but superficial wounds on the right anterior lower tibial area. She has some degree of chronic venous stasis but no evidence of cellulitis. I do not believe she has any significant PAD to compromise blood flow to these areas and compression Electronic Signature(s) Signed: 07/26/2020 4:54:15 PM By: Linton Ham MD Entered By: Linton Ham on 07/26/2020 16:31:48 -------------------------------------------------------------------------------- Physician Orders Details Patient Name: Date of Service: ALYSSON, GEIST 07/26/2020 2:45 PM Medical Record Number: [id number] Patient Account Number: [id number] Date of Birth/Sex: Treating RN: [birth year] (79 y.o. [name] Primary Care Provider: Margaretha Sheffield Other Clinician: Referring Provider: Treating Provider/Extender: Benedetto Coons in Treatment: 0 Verbal / Phone Orders: No Diagnosis Coding Follow-up Appointments Return Appointment in 2 weeks. Nurse Visit: - next week Dressing Change Frequency Do not change entire dressing for one week. Skin Barriers/Peri-Wound Care Wound #3 Right,Medial Lower Leg TCA Cream or Ointment Wound #4 Right,Proximal,Medial Lower Leg TCA Cream or Ointment Wound Cleansing May shower with protection. Primary Wound Dressing Wound #3 Right,Medial Lower Leg Hydrofera Blue Wound #4 Right,Proximal,Medial Lower Leg Hydrofera Blue Secondary Dressing Wound #3 Right,Medial Lower Leg Dry Gauze ABD pad Wound #4 Right,Proximal,Medial Lower Leg Dry Gauze ABD pad Edema Control 3 Layer Compression System - Right Lower Extremity Radiology X-ray, foot - s/p fall with severe pain , swelling Electronic Signature(s) Signed: 07/26/2020 4:54:15 PM By: Linton Ham MD Signed: 07/26/2020 5:00:30 PM By: Carlene Coria RN Entered By: Carlene Coria on 07/26/2020 16:21:51 Prescription  07/26/2020 -------------------------------------------------------------------------------- Adline Mango MD Patient Name: Provider: 02-23-1941 [id number] Date of Birth: NPI#Jesse Sans Sex: DEA #: [id number] Phone #: License #: [place] Patient Address: [address] [place], [place] [address] [place], [place] [address] [phone] Allergies penicillin; codeine; adhesive; aspirin; iodine; Iodinated Contrast Media; Sulfa (Sulfonamide Antibiotics) Provider's Orders X-ray, foot - s/p fall with severe pain , swelling Hand Signature: Date(s): Electronic Signature(s) Signed: 07/26/2020 4:54:15 PM By: Linton Ham MD Signed: 07/26/2020 5:00:30 PM By: Carlene Coria RN Entered By: Carlene Coria on 07/26/2020 16:21:52 -------------------------------------------------------------------------------- Problem List Details Patient Name: Date of Service: [name], [name] 07/26/2020 2:45 PM Medical Record Number: [id number] Patient Account Number: [id number] Date of Birth/Sex: Treating RN: 12/04/1940 (79 y.o. [name] Primary Care Provider: Margaretha Sheffield Other Clinician: Referring Provider: Treating Provider/Extender: Benedetto Coons in Treatment: 0 Active Problems ICD-10 Encounter Code Description Active Date MDM Diagnosis I87.331 Chronic venous hypertension (idiopathic) with ulcer and inflammation of right 07/26/2020 No Yes lower extremity I89.0 Lymphedema, not elsewhere classified 07/26/2020 No Yes L97.811 Non-pressure chronic ulcer of other part of right lower leg limited to breakdown 07/26/2020 No Yes of skin E11.40 Type 2 diabetes mellitus with diabetic neuropathy, unspecified 07/26/2020 No Yes Inactive Problems Resolved Problems Electronic Signature(s) Signed: 07/26/2020 4:54:15 PM By: Linton Ham MD Entered By: Linton Ham  on 07/26/2020 16:23:40 -------------------------------------------------------------------------------- Progress Note Details Patient Name: Date of Service: [name] 07/26/2020 2:45 PM Medical Record Number:  [id number] Patient Account Number: [id number] Date of Birth/Sex: Treating RN: May 05, 1941 (79 y.o. [name] Primary Care Provider: Margaretha Sheffield Other Clinician: Referring Provider: Treating Provider/Extender: Benedetto Coons in Treatment: 0 Subjective History of Present Illness (HPI) ADMISSION 03/28/2020 This is a 79 year old woman who is accompanied by her daughter. She is moving to [place] from [place] where she lives. Infectious been going to the wound care center in [place] for the last 2 months. She apparently did not tolerate compression early on in that clinic stay. She is just using dry gauze over the wounds when she came in today. She tells me she has had both arterial and venous reflux studies although she does not have copies of these. She was apparently offered an ablation but I have no further information on that. She also is a type II diabetic. She was noted to have wounds on her lower extremities during a CHF clinic visit with Dr. Haroldine Laws and she was referred here. She has several scattered areas on the right medial lower leg and a large area of superficial ulceration on the posterior medial left lower leg. past medical history; chronic kidney disease stage III, gastroesophageal reflux disease, type 2 diabetes with neuropathy, chronic diastolic heart failure, obstructive sleep apnea, hypertension, restless leg syndrome, pulmonary venous hypertension, history of colon CA, pacemaker and apparently venous reflux. We did not do arterial studies in the clinic today we are going to try to get the results that were already done within the last 2 months in [place] 7/26; patient readmitted to the clinic last week with  predominantly chronic venous wounds almost circumferentially in the left lower leg and the right leg medially. She also has secondary lymphedema. We put her in compression. She went to the ER in [place] on Friday they remove the wrap on the left leg diagnosed her with cellulitis and put her on doxycycline. They apparently also did a ultrasound that was negative for DVT . We did not get any vascular information from [facility] where she apparently had arterial studies and venous studies. She is currently moving from [place] to [place] is up on her feet quite a bit. She was referred to vein and vascular here but never got to the appointment. ABIs were obtained here at 0.89 on the right and 1.02 on the left 8/9-Patient returns after establishing in clinic last visit, we have been doing 3 layer compression on both sides with calcium alginate, she is following up with the vein and vascular when she gets an appointment. The right side is healed the left side is still got some small open areas She has appt on Wed at Vein and vascular at which point she will come in for Nurse visit here 8/16; the patient arrives with all of her wounds closed. She has bilateral Juzo stockings. She saw Dr. Oneida Alar on 8/11 to review her reflux studies from the same day. On the right she had no evidence of DVT or SVT She did have deep venous reflux in the common femoral vein as well as superficial venous reflux in the . small saphenous vein of the mid calf on the left again no thrombus in the deep vein or superficial veins. Deep vein reflux in the common femoral vein as well as the great saphenous vein. She saw Dr. Oneida Alar in consult. Also of note he noted that she had a normal arterial scan done in [place] in April 2021, we were never able to obtain this. His feeling  was that the patient did have a dilated greater saphenous vein however minimal to no evidence of reflux in her superficial venous system. She also was  felt to have mild deep vein reflux and some degree of lymphedema. He recommended continued compression of the lower extremities with intermittent Unna boots if necessary. She was told to elevate her legs. If she develops recurrent ulcerations they were not opposed to revisiting the repeat venous reflux exam to see if she develops worsening reflux for consideration of laser ablation READMISSION 07/26/2020 Patient is now an 79 year old woman who lives in [place]. We had her in the clinic in the summer with wounds on her bilateral lower legs secondary to chronic venous insufficiency with some degree of lymphedema. She was seen by Dr. Oneida Alar in August. This was predominantly to review her venous reflux. She quoted normal arterial studies done in [place] in April 2021 but I have never been able to see these myself.Marland Kitchen His feeling was the patient did have a bilateral great dilated greater saphenous vein however minimal to no evidence of reflux in her superficial venous system. She was also felt to have mild deep vein reflux and some degree of lymphedema. He recommended compression and leg elevation.Marland Kitchen He was not opposed to repeating her venous reflux studies at some point if she develops worse to see if she develops worsening reflux for consideration of ablation. We discharged the patient with juxta lite stockings bilaterally and she has been compliant with wearing these. Unfortunately in September she had a fall suffering a right tibial fracture requiring an IM nail with a rod. She also had fibular fractures in at least one place. She was sent to skilled facility for rehabilitation she is now back at home. She had a DVT rule out study on 9/24 that was negative for DVT . She tells [place] that in the last 2 or 3 weeks she is developed blisters on her right lower extremity that ruptured into 2 open wounds. She has not been able to get these to close over. She religiously uses her juxta lite stockings on both  legs and she gets them on is tight as she can. Originally the left leg was the larger of the 2 legs but since the patient had surgery the right leg is more swollen and tense than the left. She also tells me she had a second fall yesterday and has had pain in the left dorsal midfoot. She has diabetic neuropathy with Charcot foot left greater than right. Past medical history includes chronic kidney disease, hypertension, type 2 diabetes with peripheral neuropathy, congestive heart failure, hypertension, history of colon CA, lumbar stenosis ABIs in our clinic were noncompressible bilaterally Patient History Information obtained from Patient. Allergies penicillin, codeine, adhesive, aspirin, iodine, Iodinated Contrast Media, Sulfa (Sulfonamide Antibiotics) Family History Diabetes - Siblings, Heart Disease - Mother, Hypertension - Mother, Kidney Disease - Siblings, No family history of Cancer, Hereditary Spherocytosis, Lung Disease, Seizures, Stroke, Thyroid Problems, Tuberculosis. Social History Never smoker, Marital Status - Widowed, Alcohol Use - Rarely, Drug Use - No History, Caffeine Use - Daily. Medical History Eyes Denies history of Cataracts, Glaucoma, Optic Neuritis Ear/Nose/Mouth/Throat Denies history of Chronic sinus problems/congestion, Middle ear problems Hematologic/Lymphatic Patient has history of Lymphedema Denies history of Anemia, Hemophilia, Human Immunodeficiency Virus, Sickle Cell Disease Respiratory Denies history of Aspiration, Asthma, Chronic Obstructive Pulmonary Disease (COPD), Pneumothorax, Sleep Apnea, Tuberculosis Cardiovascular Patient has history of Congestive Heart Failure, Hypertension, Peripheral Venous Disease Denies history of Angina, Arrhythmia, Coronary Artery  Disease, Deep Vein Thrombosis, Hypotension, Myocardial Infarction, Peripheral Arterial Disease, Phlebitis, Vasculitis Gastrointestinal Denies history of Cirrhosis , Colitis, Crohnoos, Hepatitis A,  Hepatitis B, Hepatitis C Endocrine Patient has history of Type II Diabetes Denies history of Type I Diabetes Genitourinary Denies history of End Stage Renal Disease Immunological Denies history of Lupus Erythematosus, Raynaudoos, Scleroderma Integumentary (Skin) Denies history of History of Burn Musculoskeletal Patient has history of Osteoarthritis Neurologic Patient has history of Neuropathy Denies history of Dementia, Quadriplegia, Paraplegia, Seizure Disorder Oncologic Denies history of Received Chemotherapy, Received Radiation Psychiatric Patient has history of Confinement Anxiety Denies history of Anorexia/bulimia Hospitalization/Surgery History - tibia nail insertion right. - cardiac cath. - lumbar laminectomy. - pacemaker implanted. - appendectomy. - lumpectomy left breast. - DandC uterus. - partial nephrectomy. - tosillectomy. - tubal ligation. Medical A Surgical History Notes nd Constitutional Symptoms (General Health) obesity Genitourinary CKD stage 3 Oncologic hx colon CA Review of Systems (ROS) Constitutional Symptoms (General Health) Denies complaints or symptoms of Fatigue, Fever, Chills, Marked Weight Change. Eyes Complains or has symptoms of Glasses / Contacts. Respiratory Complains or has symptoms of Shortness of Breath. Denies complaints or symptoms of Chronic or frequent coughs. Cardiovascular Denies complaints or symptoms of Chest pain. Gastrointestinal Denies complaints or symptoms of Frequent diarrhea, Nausea, Vomiting. Genitourinary Denies complaints or symptoms of Frequent urination. Integumentary (Skin) Complains or has symptoms of Wounds - right lower leg. Musculoskeletal Denies complaints or symptoms of Muscle Pain, Muscle Weakness. Neurologic Complains or has symptoms of Numbness/parasthesias. Psychiatric Complains or has symptoms of Claustrophobia. Denies complaints or symptoms of Suicidal. Objective Constitutional Patient is  hypertensive.. Pulse regular and within target range for patient.Marland Kitchen Respirations regular, non-labored and within target range.. Temperature is normal and within the target range for the patient.Marland Kitchen Appears in no distress. Vitals Time Taken: 3:29 PM, Height: 63 in, Source: Stated, Weight: 200 lbs, Source: Stated, BMI: 35.4, Temperature: 98.4 F, Pulse: 75 bpm, Respiratory Rate: 18 breaths/min, Blood Pressure: 149/74 mmHg. Eyes Conjunctivae clear. No discharge.no icterus. Cardiovascular Right popliteal pulses palpable. Her pedal pulses are palpable at the , right dorsalis pedis and right dorsalis and right posterior tibial. Musculoskeletal Charcot feet and ankle especially on the left. She has point tenderness in the left third metatarsal mid aspect. Given her degree of neuropathy I think this is probably significant. Psychiatric appears at normal baseline. General Notes: Wound exam; she has 2 relatively large but superficial wounds on the right anterior lower tibial area. She has some degree of chronic venous stasis but no evidence of cellulitis. I do not believe she has any significant PAD to compromise blood flow to these areas and compression Integumentary (Hair, Skin) She has skin changes of chronic venous stasis on the right greater than left lower legs. Wound #3 status is Open. Original cause of wound was Blister. The wound is located on the Right,Medial Lower Leg. The wound measures 1.5cm length x 1cm width x 0.1cm depth; 1.178cm^2 area and 0.118cm^3 volume. There is Fat Layer (Subcutaneous Tissue) exposed. There is no tunneling or undermining noted. There is a small amount of serous drainage noted. The wound margin is flat and intact. There is large (67-100%) red granulation within the wound bed. There is no necrotic tissue within the wound bed. Wound #4 status is Open. Original cause of wound was Blister. The wound is located on the Right,Proximal,Medial Lower Leg. The wound measures  0.7cm length x 1.8cm width x 0.1cm depth; 0.99cm^2 area and 0.099cm^3 volume. There is Fat Layer (Subcutaneous Tissue) exposed.  There is no tunneling or undermining noted. There is a small amount of serous drainage noted. The wound margin is flat and intact. There is large (67-100%) red granulation within the wound bed. There is no necrotic tissue within the wound bed. Assessment Active Problems ICD-10 Chronic venous hypertension (idiopathic) with ulcer and inflammation of right lower extremity Lymphedema, not elsewhere classified Non-pressure chronic ulcer of other part of right lower leg limited to breakdown of skin Type 2 diabetes mellitus with diabetic neuropathy, unspecified Plan Follow-up Appointments: Return Appointment in 2 weeks. Nurse Visit: - next week Dressing Change Frequency: Do not change entire dressing for one week. Skin Barriers/Peri-Wound Care: Wound #3 Right,Medial Lower Leg: TCA Cream or Ointment Wound #4 Right,Proximal,Medial Lower Leg: TCA Cream or Ointment Wound Cleansing: May shower with protection. Primary Wound Dressing: Wound #3 Right,Medial Lower Leg: Hydrofera Blue Wound #4 Right,Proximal,Medial Lower Leg: Hydrofera Blue Secondary Dressing: Wound #3 Right,Medial Lower Leg: Dry Gauze ABD pad Wound #4 Right,Proximal,Medial Lower Leg: Dry Gauze ABD pad Edema Control: 3 Layer Compression System - Right Lower Extremity Radiology ordered were: X-ray, foot - s/p fall with severe pain , swelling 1. TCA and ointment to the periwound skin. 2. We will use silver alginate, ABDs under 3 layer compression 3. The patient had arterial studies in April 2021 in [place]. They were quoted by Dr. Oneida Alar but I never actually saw these results nevertheless Dr. Oneida Alar felt this was satisfactory and I am comfortable with it going to 3 layer compression. I would like to see what I am dealing with distally before I went to for however. 4. She is going to  bring her juxta lite stockings in the next week so she can demonstrate to the nurses whether she is capable of getting to at least 30 mm compression 5. The patient has point tenderness in the mid part of her left third metatarsal after a fall yesterday. She is not complaining of pain when she stands although with her degree of neuropathy I think she deserves an x-ray and I will go ahead and order this. I spent 35 minutes in review of this patient's past medical history, face-to-face evaluation and preparation of this record Electronic Signature(s) Signed: 07/26/2020 4:54:15 PM By: Linton Ham MD Entered By: Linton Ham on 07/26/2020 16:33:49 -------------------------------------------------------------------------------- HxROS Details Patient Name: Date of Service: [name], [name] 07/26/2020 2:45 PM Medical Record Number: [id number] Patient Account Number: [id number] Date of Birth/Sex: Treating RN: April 04, 1941 (79 y.o. Elam Dutch Primary Care Provider: Margaretha Sheffield Other Clinician: Referring Provider: Treating Provider/Extender: Benedetto Coons in Treatment: 0 Information Obtained From Patient Constitutional Symptoms (General Health) Complaints and Symptoms: Negative for: Fatigue; Fever; Chills; Marked Weight Change Medical History: Past Medical History Notes: obesity Eyes Complaints and Symptoms: Positive for: Glasses / Contacts Medical History: Negative for: Cataracts; Glaucoma; Optic Neuritis Respiratory Complaints and Symptoms: Positive for: Shortness of Breath Negative for: Chronic or frequent coughs Medical History: Negative for: Aspiration; Asthma; Chronic Obstructive Pulmonary Disease (COPD); Pneumothorax; Sleep Apnea; Tuberculosis Cardiovascular Complaints and Symptoms: Negative for: Chest pain Medical History: Positive for: Congestive Heart Failure; Hypertension; Peripheral Venous Disease Negative for: Angina;  Arrhythmia; Coronary Artery Disease; Deep Vein Thrombosis; Hypotension; Myocardial Infarction; Peripheral Arterial Disease; Phlebitis; Vasculitis Gastrointestinal Complaints and Symptoms: Negative for: Frequent diarrhea; Nausea; Vomiting Medical History: Negative for: Cirrhosis ; Colitis; Crohns; Hepatitis A; Hepatitis B; Hepatitis C Genitourinary Complaints and Symptoms: Negative for: Frequent urination Medical History: Negative for: End Stage Renal Disease Past Medical History Notes:  CKD stage 3 Integumentary (Skin) Complaints and Symptoms: Positive for: Wounds - right lower leg Medical History: Negative for: History of Burn Musculoskeletal Complaints and Symptoms: Negative for: Muscle Pain; Muscle Weakness Medical History: Positive for: Osteoarthritis Neurologic Complaints and Symptoms: Positive for: Numbness/parasthesias Medical History: Positive for: Neuropathy Negative for: Dementia; Quadriplegia; Paraplegia; Seizure Disorder Psychiatric Complaints and Symptoms: Positive for: Claustrophobia Negative for: Suicidal Medical History: Positive for: Confinement Anxiety Negative for: Anorexia/bulimia Ear/Nose/Mouth/Throat Medical History: Negative for: Chronic sinus problems/congestion; Middle ear problems Hematologic/Lymphatic Medical History: Positive for: Lymphedema Negative for: Anemia; Hemophilia; Human Immunodeficiency Virus; Sickle Cell Disease Endocrine Medical History: Positive for: Type II Diabetes Negative for: Type I Diabetes Time with diabetes: 68 Treated with: Insulin Blood sugar tested every day: Yes Tested : 2-3 times per day Immunological Medical History: Negative for: Lupus Erythematosus; Raynauds; Scleroderma Oncologic Medical History: Negative for: Received Chemotherapy; Received Radiation Past Medical History Notes: hx colon CA Immunizations Pneumococcal Vaccine: Received Pneumococcal Vaccination: No Implantable  Devices Yes Hospitalization / Surgery History Type of Hospitalization/Surgery tibia nail insertion right cardiac cath lumbar laminectomy pacemaker implanted appendectomy lumpectomy left breast DandC uterus partial nephrectomy tosillectomy tubal ligation Family and Social History Cancer: No; Diabetes: Yes - Siblings; Heart Disease: Yes - Mother; Hereditary Spherocytosis: No; Hypertension: Yes - Mother; Kidney Disease: Yes - Siblings; Lung Disease: No; Seizures: No; Stroke: No; Thyroid Problems: No; Tuberculosis: No; Never smoker; Marital Status - Widowed; Alcohol Use: Rarely; Drug Use: No History; Caffeine Use: Daily; Financial Concerns: No; Food, Clothing or Shelter Needs: No; Support System Lacking: No; Transportation Concerns: No Engineer, maintenance) Signed: 07/26/2020 4:54:15 PM By: Linton Ham MD Signed: 07/26/2020 5:21:16 PM By: Baruch Gouty RN, BSN Signed: 07/26/2020 5:21:16 PM By: Baruch Gouty RN, BSN Entered By: Baruch Gouty on 07/26/2020 15:40:17 -------------------------------------------------------------------------------- SuperBill Details Patient Name: Date of Service: [name], [name] 07/26/2020 Medical Record Number: [id number] Patient Account Number: [id number] Date of Birth/Sex: Treating RN: Nov 14, 1940 (79 y.o. [name] Primary Care Provider: Margaretha Sheffield Other Clinician: Referring Provider: Treating Provider/Extender: Benedetto Coons in Treatment: 0 Diagnosis Coding ICD-10 Codes Code Description [phone] Chronic venous hypertension (idiopathic) with ulcer and inflammation of right lower extremity I89.0 Lymphedema, not elsewhere classified L97.811 Non-pressure chronic ulcer of other part of right lower leg limited to breakdown of skin E11.40 Type 2 diabetes mellitus with diabetic neuropathy, unspecified Facility Procedures CPT4 Code: [id number] Description: 99213 - WOUND CARE VISIT-LEV 3 EST  PT Modifier: Quantity: 1 Physician Procedures : CPT4 Code Description Modifier [id number] - WC PHYS LEVEL 4 - EST PT ICD-10 Diagnosis Description I87.331 Chronic venous hypertension (idiopathic) with ulcer and inflammation of right lower extremity I89.0 Lymphedema, not elsewhere classified  L97.811 Non-pressure chronic ulcer of other part of right lower leg limited to breakdown of skin E11.40 Type 2 diabetes mellitus with diabetic neuropathy, unspecified Quantity: 1 Electronic Signature(s) Signed: 07/26/2020 5:00:30 PM By: Carlene Coria RN Signed: 07/28/2020 9:03:38 AM By: Linton Ham MD Previous Signature: 07/26/2020 4:54:15 PM Version By: Linton Ham MD Entered By: Carlene Coria on 07/26/2020 17:00:05

## 2020-07-29 ENCOUNTER — Other Ambulatory Visit (HOSPITAL_COMMUNITY): Payer: Self-pay | Admitting: Internal Medicine

## 2020-08-02 ENCOUNTER — Encounter (HOSPITAL_BASED_OUTPATIENT_CLINIC_OR_DEPARTMENT_OTHER): Payer: Medicare PPO | Admitting: Internal Medicine

## 2020-08-09 ENCOUNTER — Other Ambulatory Visit (HOSPITAL_COMMUNITY): Payer: Self-pay | Admitting: *Deleted

## 2020-08-09 ENCOUNTER — Other Ambulatory Visit: Payer: Self-pay

## 2020-08-09 ENCOUNTER — Telehealth (HOSPITAL_COMMUNITY): Payer: Self-pay | Admitting: Internal Medicine

## 2020-08-09 ENCOUNTER — Encounter (HOSPITAL_BASED_OUTPATIENT_CLINIC_OR_DEPARTMENT_OTHER): Payer: Medicare PPO | Admitting: Internal Medicine

## 2020-08-09 DIAGNOSIS — E11622 Type 2 diabetes mellitus with other skin ulcer: Secondary | ICD-10-CM | POA: Diagnosis not present

## 2020-08-09 MED ORDER — HYDRALAZINE HCL 100 MG PO TABS: 100.0000 mg | ORAL_TABLET | Freq: Three times a day (TID) | ORAL | 6 refills | Status: AC

## 2020-08-09 NOTE — Progress Notes (Signed)
[name], [name] ([id number]) Visit Report for 08/09/2020 HPI Details Patient Name: Date of Service: [name], [name] 08/09/2020 2:30 PM Medical Record Number: [id number] Patient Account Number: [id number] Date of Birth/Sex: Treating RN: 12-06-1940 (79 y.o. [name] Primary Care Provider: Margaretha Sheffield Other Clinician: Referring Provider: Treating Provider/Extender: Benedetto Coons in Treatment: 2 History of Present Illness HPI Description: ADMISSION 03/28/2020 This is a 79 year old woman who is accompanied by her daughter. She is moving to [place] from [place] where she lives. Infectious been going to the wound care center in [place] for the last 2 months. She apparently did not tolerate compression early on in that clinic stay. She is just using dry gauze over the wounds when she came in today. She tells me she has had both arterial and venous reflux studies although she does not have copies of these. She was apparently offered an ablation but I have no further information on that. She also is a type II diabetic. She was noted to have wounds on her lower extremities during a CHF clinic visit with Dr. Haroldine Laws and she was referred here. She has several scattered areas on the right medial lower leg and a large area of superficial ulceration on the posterior medial left lower leg. past medical history; chronic kidney disease stage III, gastroesophageal reflux disease, type 2 diabetes with neuropathy, chronic diastolic heart failure, obstructive sleep apnea, hypertension, restless leg syndrome, pulmonary venous hypertension, history of colon CA, pacemaker and apparently venous reflux. We did not do arterial studies in the clinic today we are going to try to get the results that were already done within the last 2 months in [place] 7/26; patient readmitted to the clinic last week with predominantly chronic venous wounds almost circumferentially in  the left lower leg and the right leg medially. She also has secondary lymphedema. We put her in compression. She went to the ER in [place] on Friday they remove the wrap on the left leg diagnosed her with cellulitis and put her on doxycycline. They apparently also did a ultrasound that was negative for DVT . We did not get any vascular information from [facility] where she apparently had arterial studies and venous studies. She is currently moving from [place] to [place] is up on her feet quite a bit. She was referred to vein and vascular here but never got to the appointment. ABIs were obtained here at 0.89 on the right and 1.02 on the left 8/9-Patient returns after establishing in clinic last visit, we have been doing 3 layer compression on both sides with calcium alginate, she is following up with the vein and vascular when she gets an appointment. The right side is healed the left side is still got some small open areas She has appt on Wed at Vein and vascular at which point she will come in for Nurse visit here 8/16; the patient arrives with all of her wounds closed. She has bilateral Juzo stockings. She saw Dr. Oneida Alar on 8/11 to review her reflux studies from the same day. On the right she had no evidence of DVT or SVT She did have deep venous reflux in the common femoral vein as well as superficial venous reflux in the . small saphenous vein of the mid calf on the left again no thrombus in the deep vein or superficial veins. Deep vein reflux in the common femoral vein as well as the great saphenous vein. She saw Dr. Oneida Alar in consult. Also of note he  noted that she had a normal arterial scan done in [place] in April 2021, we were never able to obtain this. His feeling was that the patient did have a dilated greater saphenous vein however minimal to no evidence of reflux in her superficial venous system. She also was felt to have mild deep vein reflux and some degree of lymphedema.  He recommended continued compression of the lower extremities with intermittent Unna boots if necessary. She was told to elevate her legs. If she develops recurrent ulcerations they were not opposed to revisiting the repeat venous reflux exam to see if she develops worsening reflux for consideration of laser ablation READMISSION 07/26/2020 Patient is now an 79 year old woman who lives in [place]. We had her in the clinic in the summer with wounds on her bilateral lower legs secondary to chronic venous insufficiency with some degree of lymphedema. She was seen by Dr. Oneida Alar in August. This was predominantly to review her venous reflux. She quoted normal arterial studies done in [place] in April 2021 but I have never been able to see these myself.Marland Kitchen His feeling was the patient did have a bilateral great dilated greater saphenous vein however minimal to no evidence of reflux in her superficial venous system. She was also felt to have mild deep vein reflux and some degree of lymphedema. He recommended compression and leg elevation.Marland Kitchen He was not opposed to repeating her venous reflux studies at some point if she develops worse to see if she develops worsening reflux for consideration of ablation. We discharged the patient with juxta lite stockings bilaterally and she has been compliant with wearing these. Unfortunately in September she had a fall suffering a right tibial fracture requiring an IM nail with a rod. She also had fibular fractures in at least one place. She was sent to skilled facility for rehabilitation she is now back at home. She had a DVT rule out study on 9/24 that was negative for DVT . She tells [place] that in the last 2 or 3 weeks she is developed blisters on her right lower extremity that ruptured into 2 open wounds. She has not been able to get these to close over. She religiously uses her juxta lite stockings on both legs and she gets them on is tight as she can. Originally the left  leg was the larger of the 2 legs but since the patient had surgery the right leg is more swollen and tense than the left. She also tells me she had a second fall yesterday and has had pain in the left dorsal midfoot. She has diabetic neuropathy with Charcot foot left greater than right. Past medical history includes chronic kidney disease, hypertension, type 2 diabetes with peripheral neuropathy, congestive heart failure, hypertension, history of colon CA, lumbar stenosis ABIs in our clinic were noncompressible bilaterally 11/30; the wounds in the right lower extremity are healed anteriorly. She has both Tubigrip and her juxta lite stockings she uses the Tubigrip as the primary layer. We went over this with her today she has the bilateral stockings. She will need to adjust her compression to roughly 30 mmHg Electronic Signature(s) Signed: 08/09/2020 5:54:56 PM By: Linton Ham MD Entered By: Linton Ham on 08/09/2020 16:04:35 -------------------------------------------------------------------------------- Physical Exam Details Patient Name: Date of Service: [name], [name] 08/09/2020 2:30 PM Medical Record Number: [id number] Patient Account Number: [id number] Date of Birth/Sex: Treating RN: 1941-09-07 (79 y.o. [name] Primary Care Provider: Margaretha Sheffield Other Clinician: Referring Provider: Treating Provider/Extender: Bernerd Limbo  Weeks in Treatment: 2 Constitutional Patient is hypertensive.. Pulse regular and within target range for patient.Marland Kitchen Respirations regular, non-labored and within target range.. Temperature is normal and within the target range for the patient.Marland Kitchen Appears in no distress. Cardiovascular Pedal pulses palpable. Musculoskeletal She is still tender over roughly the third metatarsal head on the left she did not go to get her x-ray done. I am doubtful this is fracture. Notes Right anterior lower leg wounds are all healed. Her edema  control is adequate Electronic Signature(s) Signed: 08/09/2020 5:54:56 PM By: Linton Ham MD Entered By: Linton Ham on 08/09/2020 16:05:27 -------------------------------------------------------------------------------- Physician Orders Details Patient Name: Date of Service: [name], [name] 08/09/2020 2:30 PM Medical Record Number: [id number] Patient Account Number: [id number] Date of Birth/Sex: Treating RN: 1941/02/02 (79 y.o. [name] Primary Care Provider: Margaretha Sheffield Other Clinician: Referring Provider: Treating Provider/Extender: Benedetto Coons in Treatment: 2 Verbal / Phone Orders: No Diagnosis Coding ICD-10 Coding Code Description [phone] Chronic venous hypertension (idiopathic) with ulcer and inflammation of right lower extremity I89.0 Lymphedema, not elsewhere classified L97.811 Non-pressure chronic ulcer of other part of right lower leg limited to breakdown of skin E11.40 Type 2 diabetes mellitus with diabetic neuropathy, unspecified Discharge From [facility] Services Discharge from [place] Edema Control Patient to wear own compression stockings Avoid standing for long periods of time Elevate legs to the level of the heart or above for 30 minutes daily and/or when sitting, a frequency of: Exercise regularly Support Garment 30-40 mm/Hg pressure to: Electronic Signature(s) Signed: 08/09/2020 5:54:56 PM By: Linton Ham MD Signed: 08/09/2020 5:56:00 PM By: Carlene Coria RN Entered By: Carlene Coria on 08/09/2020 15:42:08 -------------------------------------------------------------------------------- Problem List Details Patient Name: Date of Service: [name], [name] 08/09/2020 2:30 PM Medical Record Number: [id number] Patient Account Number: [id number] Date of Birth/Sex: Treating RN: September 17, 1940 (79 y.o. [name] Primary Care Provider: Margaretha Sheffield Other Clinician: Referring Provider: Treating  Provider/Extender: Benedetto Coons in Treatment: 2 Active Problems ICD-10 Encounter Code Description Active Date MDM Diagnosis I87.331 Chronic venous hypertension (idiopathic) with ulcer and inflammation of right 07/26/2020 No Yes lower extremity I89.0 Lymphedema, not elsewhere classified 07/26/2020 No Yes L97.811 Non-pressure chronic ulcer of other part of right lower leg limited to breakdown 07/26/2020 No Yes of skin E11.40 Type 2 diabetes mellitus with diabetic neuropathy, unspecified 07/26/2020 No Yes Inactive Problems Resolved Problems Electronic Signature(s) Signed: 08/09/2020 5:54:56 PM By: Linton Ham MD Entered By: Linton Ham on 08/09/2020 16:01:39 -------------------------------------------------------------------------------- Progress Note Details Patient Name: Date of Service: [name], [name] 08/09/2020 2:30 PM Medical Record Number: [id number] Patient Account Number: [id number] Date of Birth/Sex: Treating RN: Mar 18, 1941 (79 y.o. [name] Primary Care Provider: Margaretha Sheffield Other Clinician: Referring Provider: Treating Provider/Extender: Benedetto Coons in Treatment: 2 Subjective History of Present Illness (HPI) ADMISSION 03/28/2020 This is a 79 year old woman who is accompanied by her daughter. She is moving to [place] from [place] where she lives. Infectious been going to the wound care center in [place] for the last 2 months. She apparently did not tolerate compression early on in that clinic stay. She is just using dry gauze over the wounds when she came in today. She tells me she has had both arterial and venous reflux studies although she does not have copies of these. She was apparently offered an ablation but I have no further information on that. She also is a type II diabetic. She was noted to have wounds on her  lower extremities during a CHF clinic visit with Dr. Haroldine Laws  and she was referred here. She has several scattered areas on the right medial lower leg and a large area of superficial ulceration on the posterior medial left lower leg. past medical history; chronic kidney disease stage III, gastroesophageal reflux disease, type 2 diabetes with neuropathy, chronic diastolic heart failure, obstructive sleep apnea, hypertension, restless leg syndrome, pulmonary venous hypertension, history of colon CA, pacemaker and apparently venous reflux. We did not do arterial studies in the clinic today we are going to try to get the results that were already done within the last 2 months in [place] 7/26; patient readmitted to the clinic last week with predominantly chronic venous wounds almost circumferentially in the left lower leg and the right leg medially. She also has secondary lymphedema. We put her in compression. She went to the ER in [place] on Friday they remove the wrap on the left leg diagnosed her with cellulitis and put her on doxycycline. They apparently also did a ultrasound that was negative for DVT . We did not get any vascular information from [facility] where she apparently had arterial studies and venous studies. She is currently moving from [place] to [place] is up on her feet quite a bit. She was referred to vein and vascular here but never got to the appointment. ABIs were obtained here at 0.89 on the right and 1.02 on the left 8/9-Patient returns after establishing in clinic last visit, we have been doing 3 layer compression on both sides with calcium alginate, she is following up with the vein and vascular when she gets an appointment. The right side is healed the left side is still got some small open areas She has appt on Wed at Vein and vascular at which point she will come in for Nurse visit here 8/16; the patient arrives with all of her wounds closed. She has bilateral Juzo stockings. She saw Dr. Oneida Alar on 8/11 to review her reflux  studies from the same day. On the right she had no evidence of DVT or SVT She did have deep venous reflux in the common femoral vein as well as superficial venous reflux in the . small saphenous vein of the mid calf on the left again no thrombus in the deep vein or superficial veins. Deep vein reflux in the common femoral vein as well as the great saphenous vein. She saw Dr. Oneida Alar in consult. Also of note he noted that she had a normal arterial scan done in [place] in April 2021, we were never able to obtain this. His feeling was that the patient did have a dilated greater saphenous vein however minimal to no evidence of reflux in her superficial venous system. She also was felt to have mild deep vein reflux and some degree of lymphedema. He recommended continued compression of the lower extremities with intermittent Unna boots if necessary. She was told to elevate her legs. If she develops recurrent ulcerations they were not opposed to revisiting the repeat venous reflux exam to see if she develops worsening reflux for consideration of laser ablation READMISSION 07/26/2020 Patient is now an 79 year old woman who lives in [place]. We had her in the clinic in the summer with wounds on her bilateral lower legs secondary to chronic venous insufficiency with some degree of lymphedema. She was seen by Dr. Oneida Alar in August. This was predominantly to review her venous reflux. She quoted normal arterial studies done in [place] in April 2021 but I  have never been able to see these myself.Marland Kitchen His feeling was the patient did have a bilateral great dilated greater saphenous vein however minimal to no evidence of reflux in her superficial venous system. She was also felt to have mild deep vein reflux and some degree of lymphedema. He recommended compression and leg elevation.Marland Kitchen He was not opposed to repeating her venous reflux studies at some point if she develops worse to see if she develops worsening reflux  for consideration of ablation. We discharged the patient with juxta lite stockings bilaterally and she has been compliant with wearing these. Unfortunately in September she had a fall suffering a right tibial fracture requiring an IM nail with a rod. She also had fibular fractures in at least one place. She was sent to skilled facility for rehabilitation she is now back at home. She had a DVT rule out study on 9/24 that was negative for DVT . She tells [place] that in the last 2 or 3 weeks she is developed blisters on her right lower extremity that ruptured into 2 open wounds. She has not been able to get these to close over. She religiously uses her juxta lite stockings on both legs and she gets them on is tight as she can. Originally the left leg was the larger of the 2 legs but since the patient had surgery the right leg is more swollen and tense than the left. She also tells me she had a second fall yesterday and has had pain in the left dorsal midfoot. She has diabetic neuropathy with Charcot foot left greater than right. Past medical history includes chronic kidney disease, hypertension, type 2 diabetes with peripheral neuropathy, congestive heart failure, hypertension, history of colon CA, lumbar stenosis ABIs in our clinic were noncompressible bilaterally 11/30; the wounds in the right lower extremity are healed anteriorly. She has both Tubigrip and her juxta lite stockings she uses the Tubigrip as the primary layer. We went over this with her today she has the bilateral stockings. She will need to adjust her compression to roughly 30 mmHg Objective Constitutional Patient is hypertensive.. Pulse regular and within target range for patient.Marland Kitchen Respirations regular, non-labored and within target range.. Temperature is normal and within the target range for the patient.Marland Kitchen Appears in no distress. Vitals Time Taken: 2:54 PM, Height: 63 in, Weight: 200 lbs, BMI: 35.4, T emperature: 97.7 F, Pulse: 84  bpm, Respiratory Rate: 18 breaths/min, Blood Pressure: 155/75 mmHg, Capillary Blood Glucose: 51 mg/dl. General Notes: pt. said she ate right after checking sugar but did not re-check. no s/s of hypoglycemi Cardiovascular Pedal pulses palpable. Musculoskeletal She is still tender over roughly the third metatarsal head on the left she did not go to get her x-ray done. I am doubtful this is fracture. General Notes: Right anterior lower leg wounds are all healed. Her edema control is adequate Integumentary (Hair, Skin) Wound #3 status is Healed - Epithelialized. Original cause of wound was Blister. The wound is located on the Right,Medial Lower Leg. The wound measures 0cm length x 0cm width x 0cm depth; 0cm^2 area and 0cm^3 volume. There is no tunneling or undermining noted. There is a none present amount of drainage noted. The wound margin is flat and intact. There is no granulation within the wound bed. There is no necrotic tissue within the wound bed. Wound #4 status is Healed - Epithelialized. Original cause of wound was Blister. The wound is located on the Right,Proximal,Medial Lower Leg. The wound measures 0cm length x  0cm width x 0cm depth; 0cm^2 area and 0cm^3 volume. There is no tunneling or undermining noted. There is a none present amount of drainage noted. The wound margin is flat and intact. There is no granulation within the wound bed. There is no necrotic tissue within the wound bed. Assessment Active Problems ICD-10 Chronic venous hypertension (idiopathic) with ulcer and inflammation of right lower extremity Lymphedema, not elsewhere classified Non-pressure chronic ulcer of other part of right lower leg limited to breakdown of skin Type 2 diabetes mellitus with diabetic neuropathy, unspecified Plan Discharge From [facility] Services: Discharge from [place] Edema Control: Patient to wear own compression stockings Avoid standing for long periods of time Elevate legs to the  level of the heart or above for 30 minutes daily and/or when sitting, a frequency of: Exercise regularly Support Garment 30-40 mm/Hg pressure to: 1. The patient can be discharged 2. She is going to use Tubigrip's as the undersurface of external compression garments. I am looking for 30 mm at least of pressure Electronic Signature(s) Signed: 08/09/2020 5:54:56 PM By: Linton Ham MD Entered By: Linton Ham on 08/09/2020 52:84:13 -------------------------------------------------------------------------------- SuperBill Details Patient Name: Date of Service: [name], [name] 08/09/2020 Medical Record Number: [id number] Patient Account Number: [id number] Date of Birth/Sex: Treating RN: October 18, 1940 (79 y.o. [name] Primary Care Provider: Margaretha Sheffield Other Clinician: Referring Provider: Treating Provider/Extender: Benedetto Coons in Treatment: 2 Diagnosis Coding ICD-10 Codes Code Description [phone] Chronic venous hypertension (idiopathic) with ulcer and inflammation of right lower extremity I89.0 Lymphedema, not elsewhere classified L97.811 Non-pressure chronic ulcer of other part of right lower leg limited to breakdown of skin E11.40 Type 2 diabetes mellitus with diabetic neuropathy, unspecified Facility Procedures CPT4 Code: [id number] Description: [phone] - WOUND CARE VISIT-LEV 2 EST PT Modifier: Quantity: 1 Physician Procedures : CPT4 Code Description Modifier [id number] - WC PHYS LEVEL 2 - EST PT ICD-10 Diagnosis Description I87.331 Chronic venous hypertension (idiopathic) with ulcer and inflammation of right lower extremity I89.0 Lymphedema, not elsewhere classified  L97.811 Non-pressure chronic ulcer of other part of right lower leg limited to breakdown of skin Quantity: 1 Electronic Signature(s) Signed: 08/09/2020 5:54:56 PM By: Linton Ham MD Entered By: Linton Ham on 08/09/2020 16:06:33

## 2020-08-09 NOTE — Progress Notes (Signed)
[name], [name] ([id number]) Visit Report for 08/09/2020 Arrival Information Details Patient Name: Date of Service: [name], [name] 08/09/2020 2:30 PM Medical Record Number: [id number] Patient Account Number: [id number] Date of Birth/Sex: Treating RN: 1940/10/20 (79 y.o. [name], [name] Primary Care [name]: Margaretha Sheffield Other Clinician: Referring [name]: Treating [name]: Benedetto Coons in Treatment: 2 Visit Information History Since Last Visit Added or deleted any medications: No Patient Arrived: Ambulatory Any new allergies or adverse reactions: No Arrival Time: 14:51 Had a fall or experienced change in Yes Accompanied By: self activities of daily living that may affect Transfer Assistance: None risk of falls: Patient Identification Verified: Yes Signs or symptoms of abuse/neglect since last visito No Secondary Verification Process Completed: Yes Hospitalized since last visit: No Patient Requires Transmission-Based Precautions: No Implantable device outside of the clinic excluding No Patient Has Alerts: Yes cellular tissue based products placed in the center Patient Alerts: R ABI N/C since last visit: Has Dressing in Place as Prescribed: Yes Pain Present Now: No Electronic Signature(s) Signed: 08/09/2020 5:52:12 PM By: Rhae Hammock RN Entered By: Rhae Hammock on 08/09/2020 14:53:43 -------------------------------------------------------------------------------- Clinic Level of Care Assessment Details Patient Name: Date of Service: [name], [name] 08/09/2020 2:30 PM Medical Record Number: [id number] Patient Account Number: [id number] Date of Birth/Sex: Treating RN: [birth year] (79 y.o. [name] Primary Care [name]: Margaretha Sheffield Other Clinician: Referring [name]: Treating [name]: Benedetto Coons in Treatment: 2 Clinic Level of Care Assessment Items TOOL 4 Quantity  Score X- 1 0 Use when only an EandM is performed on FOLLOW-UP visit ASSESSMENTS - Nursing Assessment / Reassessment X- 1 10 Reassessment of Co-morbidities (includes updates in patient status) X- 1 5 Reassessment of Adherence to Treatment Plan ASSESSMENTS - Wound and Skin A ssessment / Reassessment X - Simple Wound Assessment / Reassessment - one wound 1 5 []  - 0 Complex Wound Assessment / Reassessment - multiple wounds []  - 0 Dermatologic / Skin Assessment (not related to wound area) ASSESSMENTS - Focused Assessment []  - 0 Circumferential Edema Measurements - multi extremities []  - 0 Nutritional Assessment / Counseling / Intervention []  - 0 Lower Extremity Assessment (monofilament, tuning fork, pulses) []  - 0 Peripheral Arterial Disease Assessment (using hand held doppler) ASSESSMENTS - Ostomy and/or Continence Assessment and Care []  - 0 Incontinence Assessment and Management []  - 0 Ostomy Care Assessment and Management (repouching, etc.) PROCESS - Coordination of Care X - Simple Patient / Family Education for ongoing care 1 15 []  - 0 Complex (extensive) Patient / Family Education for ongoing care []  - 0 Staff obtains Programmer, systems, Records, T Results / Process Orders est []  - 0 Staff telephones HHA, Nursing Homes / Clarify orders / etc []  - 0 Routine Transfer to another Facility (non-emergent condition) []  - 0 Routine Hospital Admission (non-emergent condition) []  - 0 New Admissions / Biomedical engineer / Ordering NPWT Apligraf, etc. , []  - 0 Emergency Hospital Admission (emergent condition) X- 1 10 Simple Discharge Coordination []  - 0 Complex (extensive) Discharge Coordination PROCESS - Special Needs []  - 0 Pediatric / Minor Patient Management []  - 0 Isolation Patient Management []  - 0 Hearing / Language / Visual special needs []  - 0 Assessment of Community assistance (transportation, D/C planning, etc.) []  - 0 Additional assistance / Altered  mentation []  - 0 Support Surface(s) Assessment (bed, cushion, seat, etc.) INTERVENTIONS - Wound Cleansing / Measurement X - Simple Wound Cleansing - one wound 1 5 []  - 0 Complex Wound Cleansing - multiple  wounds X- 1 5 Wound Imaging (photographs - any number of wounds) []  - 0 Wound Tracing (instead of photographs) X- 1 5 Simple Wound Measurement - one wound []  - 0 Complex Wound Measurement - multiple wounds INTERVENTIONS - Wound Dressings []  - 0 Small Wound Dressing one or multiple wounds []  - 0 Medium Wound Dressing one or multiple wounds []  - 0 Large Wound Dressing one or multiple wounds []  - 0 Application of Medications - topical []  - 0 Application of Medications - injection INTERVENTIONS - Miscellaneous []  - 0 External ear exam []  - 0 Specimen Collection (cultures, biopsies, blood, body fluids, etc.) []  - 0 Specimen(s) / Culture(s) sent or taken to Lab for analysis []  - 0 Patient Transfer (multiple staff / Civil Service fast streamer / Similar devices) []  - 0 Simple Staple / Suture removal (25 or less) []  - 0 Complex Staple / Suture removal (26 or more) []  - 0 Hypo / Hyperglycemic Management (close monitor of Blood Glucose) []  - 0 Ankle / Brachial Index (ABI) - do not check if billed separately X- 1 5 Vital Signs Has the patient been seen at the hospital within the last three years: Yes Total Score: 65 Level Of Care: New/Established - Level 2 Electronic Signature(s) Signed: 08/09/2020 5:56:00 PM By: Carlene Coria RN Entered By: Carlene Coria on 08/09/2020 15:44:28 -------------------------------------------------------------------------------- Encounter Discharge Information Details Patient Name: Date of Service: [name], [name] 08/09/2020 2:30 PM Medical Record Number: [id number] Patient Account Number: [id number] Date of Birth/Sex: Treating RN: [birth year] (79 y.o. [name] Primary Care Tamma Brigandi: Margaretha Sheffield Other Clinician: Referring Havanna Groner: Treating  Hila Bolding/Extender: Benedetto Coons in Treatment: 2 Encounter Discharge Information Items Discharge Condition: Stable Ambulatory Status: Ambulatory Discharge Destination: Home Transportation: Private Auto Accompanied By: self Schedule Follow-up Appointment: No Clinical Summary of Care: Electronic Signature(s) Signed: 08/09/2020 5:54:32 PM By: Deon Pilling Entered By: Deon Pilling on 08/09/2020 15:56:41 -------------------------------------------------------------------------------- Lower Extremity Assessment Details Patient Name: Date of Service: LAKEETA, DOBOSZ 08/09/2020 2:30 PM Medical Record Number: [id number] Patient Account Number: [id number] Date of Birth/Sex: Treating RN: 1940-09-27 (79 y.o. [name], [name] Primary Care Shaquan Missey: Margaretha Sheffield Other Clinician: Referring Tavaria Mackins: Treating Mischele Detter/Extender: Benedetto Coons in Treatment: 2 Edema Assessment Assessed: Shirlyn Goltz: No] Patrice Paradise: Yes] Edema: [Left: Ye] [Right: s] Calf Left: Right: Point of Measurement: From Medial Instep 41 cm Ankle Left: Right: Point of Measurement: From Medial Instep 26.5 cm Vascular Assessment Pulses: Dorsalis Pedis Palpable: [Right:Yes] Posterior Tibial Doppler Audible: [Right:Yes] Electronic Signature(s) Signed: 08/09/2020 5:52:12 PM By: Rhae Hammock RN Entered By: Rhae Hammock on 08/09/2020 14:58:38 -------------------------------------------------------------------------------- Multi Wound Chart Details Patient Name: Date of Service: MADELEIN, MAHADEO 08/09/2020 2:30 PM Medical Record Number: [id number] Patient Account Number: [id number] Date of Birth/Sex: Treating RN: [birth year] (79 y.o. [name] Primary Care Rondalyn Belford: Margaretha Sheffield Other Clinician: Referring Greydon Betke: Treating Keyosha Tiedt/Extender: Benedetto Coons in Treatment: 2 Vital Signs Height(in): 68 Capillary Blood  Glucose(mg/dl): 51 Weight(lbs): 200 Pulse(bpm): 54 Body Mass Index(BMI): 35 Blood Pressure(mmHg): 155/75 Temperature(F): 97.7 Respiratory Rate(breaths/min): 18 Photos: [3:No Photos Right, Medial Lower Leg] [4:No Photos Right, Proximal, Medial Lower Leg] [N/A:N/A N/A] Wound Location: [3:Blister] [4:Blister] [N/A:N/A] Wounding Event: [3:Venous Leg Ulcer] [4:Venous Leg Ulcer] [N/A:N/A] Primary Etiology: [3:Diabetic Wound/Ulcer of the Lower] [4:Diabetic Wound/Ulcer of the Lower] [N/A:N/A] Secondary Etiology: [3:Extremity Lymphedema, Congestive Heart] [4:Extremity Lymphedema, Congestive Heart] [N/A:N/A] Comorbid History: [3:Failure, Hypertension, Peripheral Venous Disease, Type II Diabetes, Osteoarthritis, Neuropathy, Confinement Anxiety 07/12/2020] [4:Failure, Hypertension, Peripheral Venous Disease, Type II Diabetes,  Osteoarthritis, Neuropathy,  Confinement Anxiety 07/11/2020] [N/A:N/A] Date Acquired: [3:2] [4:2] [N/A:N/A] Weeks of Treatment: [3:Healed - Epithelialized] [4:Healed - Epithelialized] [N/A:N/A] Wound Status: [3:0x0x0] [4:0x0x0] [N/A:N/A] Measurements L x W x D (cm) [3:0] [4:0] [N/A:N/A] A (cm) : rea [3:0] [4:0] [N/A:N/A] Volume (cm) : [3:100.00%] [4:100.00%] [N/A:N/A] % Reduction in Area: [3:100.00%] [4:100.00%] [N/A:N/A] % Reduction in Volume: [3:Full Thickness Without Exposed] [4:Full Thickness Without Exposed] [N/A:N/A] Classification: [3:Support Structures None Present] [4:Support Structures None Present] [N/A:N/A] Exudate Amount: [3:Flat and Intact] [4:Flat and Intact] [N/A:N/A] Wound Margin: [3:None Present (0%)] [4:None Present (0%)] [N/A:N/A] Granulation Amount: [3:None Present (0%)] [4:None Present (0%)] [N/A:N/A] Necrotic Amount: [3:Fascia: No] [4:Fascia: No] [N/A:N/A] Exposed Structures: [3:Fat Layer (Subcutaneous Tissue): No Tendon: No Muscle: No Joint: No Bone: No Large (67-100%)] [4:Fat Layer (Subcutaneous Tissue): No Tendon: No Muscle: No Joint: No Bone: No  Large (67-100%)] [N/A:N/A] Treatment Notes Electronic Signature(s) Signed: 08/09/2020 5:54:56 PM By: Linton Ham MD Signed: 08/09/2020 5:56:00 PM By: Carlene Coria RN Entered By: Linton Ham on 08/09/2020 16:01:47 -------------------------------------------------------------------------------- Multi-Disciplinary Care Plan Details Patient Name: Date of Service: QUIN, MCPHERSON 08/09/2020 2:30 PM Medical Record Number: [id number] Patient Account Number: [id number] Date of Birth/Sex: Treating RN: Jul 10, 1941 (79 y.o. [name] Primary Care Maxey Ransom: Margaretha Sheffield Other Clinician: Referring Kerrington Greenhalgh: Treating Conception Doebler/Extender: Benedetto Coons in Treatment: 2 Active Inactive Electronic Signature(s) Signed: 08/09/2020 5:56:00 PM By: Carlene Coria RN Entered By: Carlene Coria on 08/09/2020 15:43:19 -------------------------------------------------------------------------------- Pain Assessment Details Patient Name: Date of Service: SHERESE, HEYWARD 08/09/2020 2:30 PM Medical Record Number: [id number] Patient Account Number: [id number] Date of Birth/Sex: Treating RN: 06-25-1941 (79 y.o. Benjaman Lobe Primary Care Chelsye Suhre: Margaretha Sheffield Other Clinician: Referring Sofiah Lyne: Treating Riona Lahti/Extender: Benedetto Coons in Treatment: 2 Active Problems Location of Pain Severity and Description of Pain Patient Has Paino No Site Locations Rate the pain. Current Pain Level: 0 Pain Management and Medication Current Pain Management: Electronic Signature(s) Signed: 08/09/2020 5:52:12 PM By: Rhae Hammock RN Entered By: Rhae Hammock on 08/09/2020 14:56:42 -------------------------------------------------------------------------------- Patient/Caregiver Education Details Patient Name: Date of Service: [name] Doe 11/30/2021andnbsp2:30 PM Medical Record Number: [id number] Patient Account Number:  [id number] Date of Birth/Gender: Treating RN: 1941-02-07 (79 y.o. [name] Primary Care Physician: Margaretha Sheffield Other Clinician: Referring Physician: Treating Physician/Extender: Benedetto Coons in Treatment: 2 Education Assessment Education Provided To: Patient Education Topics Provided Wound/Skin Impairment: Methods: Explain/Verbal Responses: State content correctly Electronic Signature(s) Signed: 08/09/2020 5:56:00 PM By: Carlene Coria RN Entered By: Carlene Coria on 08/09/2020 15:43:42 -------------------------------------------------------------------------------- Wound Assessment Details Patient Name: Date of Service: ADELEE, HANNULA 08/09/2020 2:30 PM Medical Record Number: [id number] Patient Account Number: [id number] Date of Birth/Sex: Treating RN: [birth year] (79 y.o. [name], [name] Primary Care Raeden Belzer: Margaretha Sheffield Other Clinician: Referring Denorris Reust: Treating Kery Batzel/Extender: Benedetto Coons in Treatment: 2 Wound Status Wound Number: 3 Primary Venous Leg Ulcer Etiology: Wound Location: Right, Medial Lower Leg Secondary Diabetic Wound/Ulcer of the Lower Extremity Wounding Event: Blister Etiology: Date Acquired: 07/12/2020 Wound Healed - Epithelialized Weeks Of Treatment: 2 Status: Clustered Wound: No Comorbid Lymphedema, Congestive Heart Failure, Hypertension, Peripheral History: Venous Disease, Type II Diabetes, Osteoarthritis, Neuropathy, Confinement Anxiety Wound Measurements Length: (cm) Width: (cm) Depth: (cm) Area: (cm) Volume: (cm) 0 % Reduction in Area: 100% 0 % Reduction in Volume: 100% 0 Epithelialization: Large (67-100%) 0 Tunneling: No 0 Undermining: No Wound Description Classification: Full Thickness Without Exposed Support Structures Wound Margin: Flat and Intact Exudate Amount: None Present Foul  Odor After Cleansing: No Slough/Fibrino No Wound  Bed Granulation Amount: None Present (0%) Exposed Structure Necrotic Amount: None Present (0%) Fascia Exposed: No Fat Layer (Subcutaneous Tissue) Exposed: No Tendon Exposed: No Muscle Exposed: No Joint Exposed: No Bone Exposed: No Electronic Signature(s) Signed: 08/09/2020 5:52:12 PM By: Rhae Hammock RN Signed: 08/09/2020 5:56:00 PM By: Carlene Coria RN Entered By: Carlene Coria on 08/09/2020 15:48:21 -------------------------------------------------------------------------------- Wound Assessment Details Patient Name: Date of Service: [name], NAPIERALA 08/09/2020 2:30 PM Medical Record Number: [id number] Patient Account Number: [id number] Date of Birth/Sex: Treating RN: [birth year] (79 y.o. [name], [name] Primary Care Sloane Junkin: Margaretha Sheffield Other Clinician: Referring Naama Sappington: Treating Cellie Dardis/Extender: Benedetto Coons in Treatment: 2 Wound Status Wound Number: 4 Primary Venous Leg Ulcer Etiology: Wound Location: Right, Proximal, Medial Lower Leg Secondary Diabetic Wound/Ulcer of the Lower Extremity Wounding Event: Blister Etiology: Date Acquired: 07/11/2020 Wound Healed - Epithelialized Weeks Of Treatment: 2 Status: Clustered Wound: No Comorbid Lymphedema, Congestive Heart Failure, Hypertension, Peripheral History: Venous Disease, Type II Diabetes, Osteoarthritis, Neuropathy, Confinement Anxiety Wound Measurements Length: (cm) Width: (cm) Depth: (cm) Area: (cm) Volume: (cm) 0 % Reduction in Area: 100% 0 % Reduction in Volume: 100% 0 Epithelialization: Large (67-100%) 0 Tunneling: No 0 Undermining: No Wound Description Classification: Full Thickness Without Exposed Support Structures Wound Margin: Flat and Intact Exudate Amount: None Present Foul Odor After Cleansing: No Slough/Fibrino No Wound Bed Granulation Amount: None Present (0%) Exposed Structure Necrotic Amount: None Present (0%) Fascia Exposed: No Fat Layer  (Subcutaneous Tissue) Exposed: No Tendon Exposed: No Muscle Exposed: No Joint Exposed: No Bone Exposed: No Electronic Signature(s) Signed: 08/09/2020 5:52:12 PM By: Rhae Hammock RN Signed: 08/09/2020 5:56:00 PM By: Carlene Coria RN Entered By: Carlene Coria on 08/09/2020 15:48:41 -------------------------------------------------------------------------------- [name] Details Patient Name: Date of Service: [name] Doe 08/09/2020 2:30 PM Medical Record Number: [id number] Patient Account Number: [id number] Date of Birth/Sex: Treating RN: 1940/12/20 (79 y.o. [name], [name] Primary Care Beren Yniguez: Margaretha Sheffield Other Clinician: Referring Epic Tribbett: Treating Issaih Kaus/Extender: Benedetto Coons in Treatment: 2 Vital Signs Time Taken: 14:54 Temperature (F): 97.7 Height (in): 63 Pulse (bpm): 84 Weight (lbs): 200 Respiratory Rate (breaths/min): 18 Body Mass Index (BMI): 35.4 Blood Pressure (mmHg): 155/75 Capillary Blood Glucose (mg/dl): 51 Reference Range: 80 - 120 mg / dl Notes pt. said she ate right after checking sugar but did not re-check. no s/s of hypoglycemi Electronic Signature(s) Signed: 08/09/2020 5:52:12 PM By: Rhae Hammock RN Entered By: Rhae Hammock on 08/09/2020 14:56:35

## 2020-08-09 NOTE — Telephone Encounter (Signed)
Pt request refill for hydralazine, please change pharmacy to [facility] [address], pt has moved to the area, she can be reached @[phone], if needed. Thanks

## 2020-08-09 NOTE — Progress Notes (Signed)
[name], [name] ([id number]) Visit Report for 08/09/2020 Fall Risk Assessment Details Patient Name: Date of Service: [name], [name] 08/09/2020 2:30 PM Medical Record Number: [id number] Patient Account Number: [id number] Date of Birth/Sex: Treating RN: Feb 07, 1941 (79 y.o. [name], [name] Primary Care Hollace Michelli: Margaretha Sheffield Other Clinician: Referring Jasmia Angst: Treating Ramaj Frangos/Extender: Benedetto Coons in Treatment: 2 Fall Risk Assessment Items Have you had 2 or more falls in the last 12 monthso 0 Yes Have you had any fall that resulted in injury in the last 12 monthso 0 Yes FALLS RISK SCREEN History of falling - immediate or within 3 months 25 Yes Secondary diagnosis (Do you have 2 or more medical diagnoseso) 15 Yes Ambulatory aid None/bed rest/wheelchair/nurse 0 No Crutches/cane/walker 0 No Furniture 0 No Intravenous therapy Access/Saline/Heparin Lock 0 No Gait/Transferring Normal/ bed rest/ wheelchair 0 No Weak (short steps with or without shuffle, stooped but able to lift head while walking, may seek 10 Yes support from furniture) Impaired (short steps with shuffle, may have difficulty arising from chair, head down, impaired 0 No balance) Mental Status Oriented to own ability 0 No Electronic Signature(s) Signed: 08/09/2020 5:52:12 PM By: Rhae Hammock RN Entered By: Rhae Hammock on 08/09/2020 14:54:24

## 2020-08-16 ENCOUNTER — Encounter (HOSPITAL_BASED_OUTPATIENT_CLINIC_OR_DEPARTMENT_OTHER): Payer: Medicare PPO | Admitting: Internal Medicine

## 2020-09-16 ENCOUNTER — Ambulatory Visit (INDEPENDENT_AMBULATORY_CARE_PROVIDER_SITE_OTHER): Payer: Medicare PPO

## 2020-09-16 DIAGNOSIS — I495 Sick sinus syndrome: Secondary | ICD-10-CM | POA: Diagnosis not present

## 2020-09-16 LAB — CUP PACEART REMOTE DEVICE CHECK
Battery Remaining Longevity: 67 mo
Battery Voltage: 3 V
Brady Statistic AP VP Percent: 0.27 %
Brady Statistic AP VS Percent: 98.39 %
Brady Statistic AS VP Percent: 0 %
Brady Statistic AS VS Percent: 1.35 %
Brady Statistic RA Percent Paced: 98.61 %
Brady Statistic RV Percent Paced: 0.27 %
Date Time Interrogation Session: 20220107081436
Implantable Lead Implant Date: 20161110
Implantable Lead Implant Date: 20161110
Implantable Lead Location: 753859
Implantable Lead Location: 753860
Implantable Lead Model: 5076
Implantable Lead Model: 5076
Implantable Pulse Generator Implant Date: 20161110
Lead Channel Impedance Value: 399 Ohm
Lead Channel Impedance Value: 399 Ohm
Lead Channel Impedance Value: 437 Ohm
Lead Channel Impedance Value: 456 Ohm
Lead Channel Pacing Threshold Amplitude: 0.625 V
Lead Channel Pacing Threshold Amplitude: 1.75 V
Lead Channel Pacing Threshold Pulse Width: 0.4 ms
Lead Channel Pacing Threshold Pulse Width: 0.4 ms
Lead Channel Sensing Intrinsic Amplitude: 5.125 mV
Lead Channel Sensing Intrinsic Amplitude: 5.125 mV
Lead Channel Sensing Intrinsic Amplitude: 6.375 mV
Lead Channel Sensing Intrinsic Amplitude: 6.375 mV
Lead Channel Setting Pacing Amplitude: 1.5 V
Lead Channel Setting Pacing Amplitude: 3.5 V
Lead Channel Setting Pacing Pulse Width: 0.4 ms
Lead Channel Setting Sensing Sensitivity: 2.8 mV

## 2020-09-18 NOTE — Progress Notes (Incomplete)
Advanced Heart Failure Clinic Note   Date:  09/18/2020   ID:  [name], DOB 11/16/1940, MRN [id number]  Location: Home  Provider location: [facility] Type of Visit: Established patient  PCP:  Pomposini, [name], MD  Cardiologist:  Fransico Him, MD Primary HF: Bensimhon  Chief Complaint: Heart Failure follow-up   History of Present Illness:  [name] is a 80 y.o. female with diastolic HF, DM, colon CA, CKD stage IIIa, severe OSA, sinus node dysfunction s/p MDT PM 2016 and venous stasis ulcers  In July 2019, she had 12 pound weight gain and she was sent to the ED by her PCP. She was sent home on metolazone every other day for 14 days. She took 4 doses and her creatinine went up to 3 so metolazone was stopped.   R/L cath in 9/19. Normal cors. Elevated filling pressures with pulmonary venous HTN.   Has moved to [place]. Following with wound clinic for LE lymphedema and sore on LLE. Not moving around much. SOB with mild exertion. No change. Sleeps in recliner. Was prescribed CPAP but she won't wear it.   Sleep study 10/19: AHI 49  Studies: PFTs 05/23/18: FEV1: 1.72 FEV1/FVC ratio: 77% DLCO: 70%  R/LHC 05/19/18: Ao = 192/74 (118) LV = 201/26 RA = 14 RV = 53/18 PA = 53/19 (38) PCW = 27 (v = 41) Fick cardiac output/index = 5.6/2.8 PVR = 2.0 WU Ao sat = 95% PA sat = 64%, 66% Assessment: 1. Normal coronaries with ectactic vessels suggestive of longstanding HTN 2. Severe HTN 3. Normal LV function 4. Signficantly elevated filling pressures with pulmonary venous HTN in setting of holding diuretics for 2 days Plan/Discussion: Filling pressures and BP elevated. Will resume diuretics. Will need aggressive titration of ant-HTN regimen.  Echo 05/14/18: - Left ventricle: Posterior basal and inferolateral hypokinesis The   cavity size was moderately dilated. Wall thickness was increased   in a pattern of mild LVH. Systolic function was normal.  The   estimated ejection fraction was in the range of 50% to 55%. Left   ventricular diastolic function parameters were normal. - Aortic valve: Sclerosis without stenosis. - Mitral valve: Moderately calcified annulus. Moderately thickened,   mildly calcified leaflets . - Left atrium: The atrium was mildly dilated. - Atrial septum: No defect or patent foramen ovale was identified.   06/21/2015 Myoview   Nuclear stress EF: 64%.  There was no ST segment deviation noted during stress.  The study is normal.  This is a low risk study. No ischemia identified.  SH: Former 30-40 pack year smoker, quit 25 years ago.    [name] denies symptoms worrisome for COVID 19.   Past Medical History:  Diagnosis Date  . Anxiety   . Arthritis   . Cancer ([place])    colon  . Chronic kidney disease    stage 3  . Chronic pain syndrome   . Diabetes ([place])   . Diabetic neuropathy ([place])   . Dyslipidemia   . Early cataracts, bilateral   . Fibromyalgia   . GERD (gastroesophageal reflux disease)   . H/O syncope   . Heart murmur   . Hypertension   . Lumbar spinal stenosis    and scoliosis  . Pneumonia   . PONV (postoperative nausea and vomiting)   . Presence of permanent cardiac pacemaker   . Restless legs   . Spinal headache   . Thyroid nodule    Past  Surgical History:  Procedure Laterality Date  . APPENDECTOMY    . BREAST SURGERY     lumpectomy  . COLON SURGERY    . DILATION AND CURETTAGE OF UTERUS    . EP IMPLANTABLE DEVICE N/A 07/21/2015   Procedure: Pacemaker Implant;  Surgeon: Deboraha Sprang, MD;  Location: [facility];  Service: Cardiovascular;  Laterality: N/A;  . LUMBAR LAMINECTOMY/DECOMPRESSION MICRODISCECTOMY N/A 02/02/2016   Procedure: DECOMPRESSION L4-L5 WITH INSITE 2 FUSION ;  Surgeon: Melina Schools, MD;  Location: [facility];  Service: Orthopedics;  Laterality: N/A;  . PARTIAL NEPHRECTOMY    . RIGHT/LEFT HEART CATH AND CORONARY ANGIOGRAPHY N/A 05/19/2018   Procedure:  RIGHT/LEFT HEART CATH AND CORONARY ANGIOGRAPHY;  Surgeon: Jolaine Artist, MD;  Location: [facility];  Service: Cardiovascular;  Laterality: N/A;  . TIBIA IM NAIL INSERTION Right 05/19/2020   Procedure: INTRAMEDULLARY (IM) NAIL TIBIAL;  Surgeon: Nicholes Stairs, MD;  Location: [facility];  Service: Orthopedics;  Laterality: Right;  . TONSILLECTOMY    . TUBAL LIGATION       Current Outpatient Medications  Medication Sig Dispense Refill  . ACCU-CHEK AVIVA PLUS test strip     . Accu-Chek Softclix Lancets lancets     . acetaminophen (TYLENOL) 500 MG tablet Take 2 tablets (1,000 mg total) by mouth 3 (three) times daily. 30 tablet 0  . apixaban (ELIQUIS) 2.5 MG TABS tablet Take 1 tablet (2.5 mg total) by mouth 2 (two) times daily for 28 days. 56 tablet 0  . B-D UF III MINI PEN NEEDLES 31G X 5 MM MISC     . Blood Glucose Monitoring Suppl (ACCU-CHEK AVIVA PLUS) w/Device KIT     . Cholecalciferol (VITAMIN D) 2000 UNITS tablet Take 2,000 Units by mouth daily.    . cyclobenzaprine (FLEXERIL) 10 MG tablet Take 10 mg by mouth 3 (three) times daily as needed for muscle spasms.    Marland Kitchen docusate sodium (COLACE) 100 MG capsule Take 1 capsule (100 mg total) by mouth 2 (two) times daily. 10 capsule 0  . doxylamine, Sleep, (UNISOM) 25 MG tablet Take 25 mg by mouth at bedtime.    . DULoxetine (CYMBALTA) 20 MG capsule Take 20 mg by mouth at bedtime.     . empagliflozin (JARDIANCE) 10 MG TABS tablet Take 1 tablet (10 mg total) by mouth daily before breakfast. 90 tablet 3  . gabapentin (NEURONTIN) 300 MG capsule Take 300 mg by mouth at bedtime.     Marland Kitchen glucose blood (ACCU-CHEK AVIVA PLUS) test strip TK TO TEST BLOOD SUGAR QD    . glucose blood (ACCU-CHEK AVIVA PLUS) test strip daily    . hydrALAZINE (APRESOLINE) 100 MG tablet Take 1 tablet (100 mg total) by mouth 3 (three) times daily. 90 tablet 6  . insulin aspart (NOVOLOG) 100 UNIT/ML injection Inject 0-15 Units into the skin 3 (three) times daily with  meals. 10 mL 11  . insulin aspart (NOVOLOG) 100 UNIT/ML injection Inject 0-5 Units into the skin at bedtime. 10 mL 11  . insulin glargine (LANTUS) 100 UNIT/ML injection Inject 0.5 mLs (50 Units total) into the skin daily. 10 mL 11  . labetalol (NORMODYNE) 200 MG tablet TAKE 1 TABLET(200 MG) BY MOUTH TWICE DAILY (Patient taking differently: Take 200 mg by mouth 2 (two) times daily. ) 180 tablet 3  . omeprazole (PRILOSEC OTC) 20 MG tablet Take 20 mg by mouth daily.     . polyethylene glycol (MIRALAX / GLYCOLAX) 17 g packet  Take 17 g by mouth daily as needed for moderate constipation. 14 each 0  . pramipexole (MIRAPEX) 0.125 MG tablet Take 0.25 mg by mouth 2 (two) times daily.    . rosuvastatin (CRESTOR) 10 MG tablet Take 10 mg by mouth daily.     Marland Kitchen torsemide (DEMADEX) 20 MG tablet Take 2 tablets (40 mg total) by mouth daily. Take extra at 2pm if weight is 216 or greater 225 tablet 3  . traMADol (ULTRAM) 50 MG tablet Take 1-2 tablets (50-100 mg total) by mouth every 6 (six) hours as needed for moderate pain. 30 tablet   . traMADol (ULTRAM) 50 MG tablet Take 2 tablets (100 mg total) by mouth 3 (three) times daily as needed for moderate pain or severe pain (1 tablet for moderate, 2 for severe). 40 tablet 0   No current facility-administered medications for this encounter.    Allergies:   Penicillins, Codeine, Adhesive [tape], Aspirin, Iodine, Ivp dye [iodinated diagnostic agents], and Sulfa antibiotics   Social History:  The patient  reports that she quit smoking about 37 years ago. She started smoking about 67 years ago. She has never used smokeless tobacco. She reports current alcohol use. She reports that she does not use drugs.   Family History:  The patient's family history includes CAD in an other family member; Sick sinus syndrome in her brother.   ROS:  Please see the history of present illness.   All other systems are personally reviewed and negative.   There were no vitals filed for this  visit.  Exam:   General:  Well appearing. No resp difficulty HEENT: normal Neck: supple. no JVD. Carotids 2+ bilat; no bruits. No lymphadenopathy or thryomegaly appreciated. Cor: PMI nondisplaced. Regular rate & rhythm. No rubs, gallops or murmurs. Lungs: clear Abdomen: obese soft, nontender, nondistended. No hepatosplenomegaly. No bruits or masses. Good bowel sounds. Extremities: no cyanosis, clubbing, rash, 2+ ankle edema with sore on LLE Neuro: alert & orientedx3, cranial nerves grossly intact. moves all 4 extremities w/o difficulty. Affect pleasant  Recent Labs: 12/10/2019: B Natriuretic Peptide 47.3 05/20/2020: Magnesium 2.1 05/21/2020: BUN 52; Creatinine, Ser 1.72; Hemoglobin 10.1; Platelets 146; Potassium 4.7; Sodium 139  Personally reviewed   Wt Readings from Last 3 Encounters:  05/23/20 101.2 kg (223 lb 1.7 oz)  04/20/20 95.3 kg (210 lb)  03/10/20 99.3 kg (219 lb)      ASSESSMENT AND PLAN:  1. Chronic Diastolic Heart Failure  - Echo 05/14/18: EF 50-55%, posterior basal and inferolateral HK, mild LVH, LA mildly dilated - R/LHC 05/19/18: normal coronaries, elevated filling pressure (had held diuretics x2 days), severe HTN. - Stable NYHA III-IIIB  - Volume status looks stable.  LE edema appears to be mostly venous insufficiency - Continue torsemide 40 daily. - Absolutely needs to wear compression hose or leg wraps for LE lymphedema. Will refer to wound center for LLE wound.  - Hold off on spiro with previous AKI - Continue Jardiance  2. HTN, severe, - Blood pressure well controlled. Continue current regimen.  3. OSA  - severe, AHI 49 by PSG in 10/19 - f/u testing AHI 11 - Had televisit with Dr. Radford Pax on 09/30/19. She refused CPAP due to claustrophobia.  - Continues to refuse   4. Dyspnea - LHC 05/19/18 with normal coronaries - PFTs with DLCO 70% - Likely mostly due to obesity + OSA - Stressed need again for weight loss with low-carb diet   5. CKD Stage IIIb-IV -  Creatinine 1.6 in  2/21.  - Recheck today - Has appt with [facility]  6. Sinus node dysfunction s/p MDT PPM - Follows with Dr Caryl Comes - Good HR variation with walking around clinic last visit 64 resting to 95 bpm walking  7. DM2 - Continue Jardiance  8. Severe obesity - stressed need for more activity and [employer] - There is no height or weight on file to calculate BMI.   [name] Bickers, MD  09/18/2020 10:11 PM  Advanced Heart Failure [facility] [address] Heart and [place] [address] [phone] (office) [phone] (fax)

## 2020-09-20 ENCOUNTER — Inpatient Hospital Stay (HOSPITAL_COMMUNITY): Admission: RE | Admit: 2020-09-20 | Payer: Medicare PPO | Source: Ambulatory Visit | Admitting: Internal Medicine

## 2020-09-30 NOTE — Progress Notes (Signed)
Remote pacemaker transmission.   

## 2020-10-12 ENCOUNTER — Other Ambulatory Visit: Payer: Self-pay | Admitting: Family Medicine

## 2020-10-12 DIAGNOSIS — Z780 Asymptomatic menopausal state: Secondary | ICD-10-CM

## 2020-10-13 ENCOUNTER — Encounter (HOSPITAL_COMMUNITY): Payer: Self-pay

## 2020-10-13 NOTE — Progress Notes (Signed)
Record request from [facility] received. All OV notes and most recent labs sent to [phone]

## 2020-10-18 DIAGNOSIS — M6788 Other specified disorders of synovium and tendon, other site: Secondary | ICD-10-CM | POA: Diagnosis not present

## 2020-10-18 DIAGNOSIS — M25522 Pain in left elbow: Secondary | ICD-10-CM | POA: Diagnosis not present

## 2020-10-25 NOTE — H&P (View-Only) (Signed)
Advanced Heart Failure Clinic Note   Date:  10/26/2020   ID:  [name], DOB Jan 12, 1941, MRN [id number]  Location: Home  Provider location: [facility] Type of Visit: Established patient  PCP:  Caren Macadam, MD  Cardiologist:  Fransico Him, MD Primary HF: Bensimhon  Chief Complaint: Heart Failure follow-up   History of Present Illness:  [name] is a 80 y.o. female with history of diastolic heart failure, DM, GERD, colon cancer, CKD stage III, 2016 pacemaker (MDT) due to symptomatic sinus node dysfunction.  In 7/19, she had 12 pound weight and she was sent to the ED by her PCP. She was sent home on metolazone every other day for 14 days. She took 4 doses and her creatinine went up to 3 so metolazone was stopped.   R/L cath in 9/19. Normal cors. Elevated filling pressures with pulmonary venous HTN.   Had fall with R tibia fracture in 9/21   She is here for f/u. Remains very SOB with any exertion. Denies edema, orthopnea or PND. LE wound has healed. Not following diet closely.    Sleep study 10/19: AHI 49  Studies: PFTs 05/23/18: FEV1: 1.72 FEV1/FVC ratio: 77% DLCO: 70%  R/LHC 05/19/18: Ao = 192/74 (118) LV = 201/26 RA = 14 RV = 53/18 PA = 53/19 (38) PCW = 27 (v = 41) Fick cardiac output/index = 5.6/2.8 PVR = 2.0 WU Ao sat = 95% PA sat = 64%, 66% Assessment: 1. Normal coronaries with ectactic vessels suggestive of longstanding HTN 2. Severe HTN 3. Normal LV function 4. Signficantly elevated filling pressures with pulmonary venous HTN in setting of holding diuretics for 2 days Plan/Discussion: Filling pressures and BP elevated. Will resume diuretics. Will need aggressive titration of ant-HTN regimen.  Echo 05/14/18: - Left ventricle: Posterior basal and inferolateral hypokinesis The   cavity size was moderately dilated. Wall thickness was increased   in a pattern of mild LVH. Systolic function was normal. The   estimated  ejection fraction was in the range of 50% to 55%. Left   ventricular diastolic function parameters were normal. - Aortic valve: Sclerosis without stenosis. - Mitral valve: Moderately calcified annulus. Moderately thickened,   mildly calcified leaflets . - Left atrium: The atrium was mildly dilated. - Atrial septum: No defect or patent foramen ovale was identified.   06/21/2015 Myoview   Nuclear stress EF: 64%.  There was no ST segment deviation noted during stress.  The study is normal.  This is a low risk study. No ischemia identified.  SH: Former 30-40 pack year smoker, quit 25 years ago.     Past Medical History:  Diagnosis Date  . Anxiety   . Arthritis   . Cancer ([place])    colon  . CHF (congestive heart failure) ([place])   . Chronic kidney disease    stage 3  . Chronic pain syndrome   . Diabetes ([place])   . Diabetic neuropathy ([place])   . Dyslipidemia   . Early cataracts, bilateral   . Fibromyalgia   . GERD (gastroesophageal reflux disease)   . H/O syncope   . Heart murmur   . Hypertension   . Lumbar spinal stenosis    and scoliosis  . Pneumonia   . PONV (postoperative nausea and vomiting)   . Presence of permanent cardiac pacemaker   . Restless legs   . Spinal headache   . Thyroid nodule    Past Surgical History:  Procedure Laterality  Date  . APPENDECTOMY    . BREAST SURGERY     lumpectomy  . COLON SURGERY    . DILATION AND CURETTAGE OF UTERUS    . EP IMPLANTABLE DEVICE N/A 07/21/2015   Procedure: Pacemaker Implant;  Surgeon: Deboraha Sprang, MD;  Location: [facility];  Service: Cardiovascular;  Laterality: N/A;  . LUMBAR LAMINECTOMY/DECOMPRESSION MICRODISCECTOMY N/A 02/02/2016   Procedure: DECOMPRESSION L4-L5 WITH INSITE 2 FUSION ;  Surgeon: Melina Schools, MD;  Location: [facility];  Service: Orthopedics;  Laterality: N/A;  . PARTIAL NEPHRECTOMY    . RIGHT/LEFT HEART CATH AND CORONARY ANGIOGRAPHY N/A 05/19/2018   Procedure: RIGHT/LEFT HEART CATH AND  CORONARY ANGIOGRAPHY;  Surgeon: Jolaine Artist, MD;  Location: [facility];  Service: Cardiovascular;  Laterality: N/A;  . TIBIA IM NAIL INSERTION Right 05/19/2020   Procedure: INTRAMEDULLARY (IM) NAIL TIBIAL;  Surgeon: Nicholes Stairs, MD;  Location: [facility];  Service: Orthopedics;  Laterality: Right;  . TONSILLECTOMY    . TUBAL LIGATION       Current Outpatient Medications  Medication Sig Dispense Refill  . ACCU-CHEK AVIVA PLUS test strip     . Accu-Chek Softclix Lancets lancets     . acetaminophen (TYLENOL) 500 MG tablet Take 2 tablets (1,000 mg total) by mouth 3 (three) times daily. 30 tablet 0  . B-D UF III MINI PEN NEEDLES 31G X 5 MM MISC     . Blood Glucose Monitoring Suppl (ACCU-CHEK AVIVA PLUS) w/Device KIT     . calcium carbonate (CALCIUM 600) 600 MG TABS tablet Take 600 mg by mouth daily with breakfast.    . Cholecalciferol (VITAMIN D) 2000 UNITS tablet Take 2,000 Units by mouth daily.    . cyclobenzaprine (FLEXERIL) 10 MG tablet Take 10 mg by mouth 3 (three) times daily as needed for muscle spasms.    Marland Kitchen doxylamine, Sleep, (UNISOM) 25 MG tablet Take 25 mg by mouth at bedtime.    . DULoxetine (CYMBALTA) 20 MG capsule Take 20 mg by mouth at bedtime.     . empagliflozin (JARDIANCE) 10 MG TABS tablet Take 1 tablet (10 mg total) by mouth daily before breakfast. 90 tablet 3  . gabapentin (NEURONTIN) 300 MG capsule Take 300 mg by mouth at bedtime.     Marland Kitchen glucose blood (ACCU-CHEK AVIVA PLUS) test strip TK TO TEST BLOOD SUGAR QD    . glucose blood (ACCU-CHEK AVIVA PLUS) test strip daily    . hydrALAZINE (APRESOLINE) 100 MG tablet Take 1 tablet (100 mg total) by mouth 3 (three) times daily. 90 tablet 6  . Insulin Degludec (TRESIBA FLEXTOUCH [place]) Inject 90 Units into the skin daily.    Marland Kitchen labetalol (NORMODYNE) 200 MG tablet TAKE 1 TABLET(200 MG) BY MOUTH TWICE DAILY 180 tablet 3  . magnesium oxide (MAG-OX) 400 MG tablet Take 400 mg by mouth daily.    Marland Kitchen omeprazole (PRILOSEC OTC)  20 MG tablet Take 20 mg by mouth daily.     Marland Kitchen oxyCODONE-acetaminophen (PERCOCET/ROXICET) 5-325 MG tablet Take 1 tablet by mouth every 4 (four) hours as needed for severe pain.    . pramipexole (MIRAPEX) 0.125 MG tablet Take 0.25 mg by mouth 2 (two) times daily.    . rosuvastatin (CRESTOR) 10 MG tablet Take 10 mg by mouth daily.     Marland Kitchen torsemide (DEMADEX) 20 MG tablet Take 2 tablets (40 mg total) by mouth daily. Take extra at 2pm if weight is 216 or greater 225 tablet 3  No current facility-administered medications for this encounter.    Allergies:   Penicillins, Codeine, Adhesive [tape], Aspirin, Iodine, Ivp dye [iodinated diagnostic agents], and Sulfa antibiotics   Social History:  The patient  reports that she quit smoking about 37 years ago. She started smoking about 67 years ago. She has never used smokeless tobacco. She reports current alcohol use. She reports that she does not use drugs.   Family History:  The patient's family history includes CAD in an other family member; Sick sinus syndrome in her brother.   ROS:  Please see the history of present illness.   All other systems are personally reviewed and negative.   Vitals:   10/26/20 1128  BP: 140/70  Pulse: 80  SpO2: 96%  Weight: 101.6 kg (224 lb)    Exam:   General:  Obese female No resp difficulty HEENT: normal Neck: supple. no JVD. Carotids 2+ bilat; no bruits. No lymphadenopathy or thryomegaly appreciated. Cor: PMI nondisplaced. Regular rate & rhythm. 2/6 AS Lungs: clear Abdomen: obese soft, nontender, nondistended. No hepatosplenomegaly. No bruits or masses. Good bowel sounds. Extremities: no cyanosis, clubbing, rash, 1+ lymphedema at ankles Neuro: alert & orientedx3, cranial nerves grossly intact. moves all 4 extremities w/o difficulty. Affect pleasant   Recent Labs: 12/10/2019: B Natriuretic Peptide 47.3 05/20/2020: Magnesium 2.1 05/21/2020: BUN 52; Creatinine, Ser 1.72; Hemoglobin 10.1; Platelets 146; Potassium  4.7; Sodium 139  Personally reviewed   Wt Readings from Last 3 Encounters:  10/26/20 101.6 kg (224 lb)  05/23/20 101.2 kg (223 lb 1.7 oz)  04/20/20 95.3 kg (210 lb)      ASSESSMENT AND PLAN:  1. Chronic Diastolic Heart Failure  - Echo 05/14/18: EF 50-55%, posterior basal and inferolateral HK, mild LVH, LA mildly dilated - R/LHC 05/19/18: normal coronaries, elevated filling pressure (had held diuretics x2 days), severe HTN. - Stable NYHA III dyspnea - Volume status looks ok.  LE edema appears to be mostly venous insufficiency - Continue torsemide 40 daily. - Continue compression hose or leg wraps for LE lymphedema.  - Hold off on spiro with previous AKI and hyperkalemia - Continue Jardiance - I don't think volume overload is the main issue here but certainly could be contributing. I suspect main issues are obesity and deconditioning. Given ongoing NYHA III symptoms with check RHC to assess filling pressures and look for PH.  - [place] repeat echo to recheck EF and AS murmur  2. HTN, severe, - Blood pressure well controlled. Continue current regimen.  3. OSA  - severe, AHI 49 by PSG in 10/19 - f/u testing AHI 11 - Had televisit with Dr. Radford Pax on 09/30/19. She refused CPAP due to claustrophobia.  - Continues to refuse   4. Dyspnea - LHC 05/19/18 with normal coronaries - PFTs with DLCO 70% - Likely mostly due to obesity + OSA - Stressed need again for weight loss with low-carb diet  - See plan as above  5. CKD Stage IIIb-IV - Creatinine 1.6 in 2/21.  - Labs today - Has appt with [facility]  6. Sinus node dysfunction s/p MDT PPM - Follows with Dr Caryl Comes - Good HR variation with walking around clinic last visit 64 resting to 95 bpm walking  7. DM - Continue Jardiance  8. Severe obesity - again stressed need for more activity and [place] Body mass index is 39.68 kg/m.     Glori Bickers, MD  10/26/2020 11:45 AM  Advanced Heart Failure  [place] [address]  [address] Heart and [place] [address] [phone] (office) [phone] (fax)

## 2020-10-25 NOTE — Progress Notes (Signed)
Advanced Heart Failure Clinic Note   Date:  10/26/2020   ID:  [name], DOB Jan 12, 1941, MRN [id number]  Location: Home  Provider location: [facility] Type of Visit: Established patient  PCP:  Caren Macadam, MD  Cardiologist:  Fransico Him, MD Primary HF: Jashira Cotugno  Chief Complaint: Heart Failure follow-up   History of Present Illness:  [name] is a 80 y.o. female with history of diastolic heart failure, DM, GERD, colon cancer, CKD stage III, 2016 pacemaker (MDT) due to symptomatic sinus node dysfunction.  In 7/19, she had 12 pound weight and she was sent to the ED by her PCP. She was sent home on metolazone every other day for 14 days. She took 4 doses and her creatinine went up to 3 so metolazone was stopped.   R/L cath in 9/19. Normal cors. Elevated filling pressures with pulmonary venous HTN.   Had fall with R tibia fracture in 9/21   She is here for f/u. Remains very SOB with any exertion. Denies edema, orthopnea or PND. LE wound has healed. Not following diet closely.    Sleep study 10/19: AHI 49  Studies: PFTs 05/23/18: FEV1: 1.72 FEV1/FVC ratio: 77% DLCO: 70%  R/LHC 05/19/18: Ao = 192/74 (118) LV = 201/26 RA = 14 RV = 53/18 PA = 53/19 (38) PCW = 27 (v = 41) Fick cardiac output/index = 5.6/2.8 PVR = 2.0 WU Ao sat = 95% PA sat = 64%, 66% Assessment: 1. Normal coronaries with ectactic vessels suggestive of longstanding HTN 2. Severe HTN 3. Normal LV function 4. Signficantly elevated filling pressures with pulmonary venous HTN in setting of holding diuretics for 2 days Plan/Discussion: Filling pressures and BP elevated. Will resume diuretics. Will need aggressive titration of ant-HTN regimen.  Echo 05/14/18: - Left ventricle: Posterior basal and inferolateral hypokinesis The   cavity size was moderately dilated. Wall thickness was increased   in a pattern of mild LVH. Systolic function was normal. The   estimated  ejection fraction was in the range of 50% to 55%. Left   ventricular diastolic function parameters were normal. - Aortic valve: Sclerosis without stenosis. - Mitral valve: Moderately calcified annulus. Moderately thickened,   mildly calcified leaflets . - Left atrium: The atrium was mildly dilated. - Atrial septum: No defect or patent foramen ovale was identified.   06/21/2015 Myoview   Nuclear stress EF: 64%.  There was no ST segment deviation noted during stress.  The study is normal.  This is a low risk study. No ischemia identified.  SH: Former 30-40 pack year smoker, quit 25 years ago.     Past Medical History:  Diagnosis Date  . Anxiety   . Arthritis   . Cancer ([place])    colon  . CHF (congestive heart failure) ([place])   . Chronic kidney disease    stage 3  . Chronic pain syndrome   . Diabetes ([place])   . Diabetic neuropathy ([place])   . Dyslipidemia   . Early cataracts, bilateral   . Fibromyalgia   . GERD (gastroesophageal reflux disease)   . H/O syncope   . Heart murmur   . Hypertension   . Lumbar spinal stenosis    and scoliosis  . Pneumonia   . PONV (postoperative nausea and vomiting)   . Presence of permanent cardiac pacemaker   . Restless legs   . Spinal headache   . Thyroid nodule    Past Surgical History:  Procedure Laterality  Date  . APPENDECTOMY    . BREAST SURGERY     lumpectomy  . COLON SURGERY    . DILATION AND CURETTAGE OF UTERUS    . EP IMPLANTABLE DEVICE N/A 07/21/2015   Procedure: Pacemaker Implant;  Surgeon: Deboraha Sprang, MD;  Location: [facility];  Service: Cardiovascular;  Laterality: N/A;  . LUMBAR LAMINECTOMY/DECOMPRESSION MICRODISCECTOMY N/A 02/02/2016   Procedure: DECOMPRESSION L4-L5 WITH INSITE 2 FUSION ;  Surgeon: Melina Schools, MD;  Location: [facility];  Service: Orthopedics;  Laterality: N/A;  . PARTIAL NEPHRECTOMY    . RIGHT/LEFT HEART CATH AND CORONARY ANGIOGRAPHY N/A 05/19/2018   Procedure: RIGHT/LEFT HEART CATH AND  CORONARY ANGIOGRAPHY;  Surgeon: Jolaine Artist, MD;  Location: [facility];  Service: Cardiovascular;  Laterality: N/A;  . TIBIA IM NAIL INSERTION Right 05/19/2020   Procedure: INTRAMEDULLARY (IM) NAIL TIBIAL;  Surgeon: Nicholes Stairs, MD;  Location: [facility];  Service: Orthopedics;  Laterality: Right;  . TONSILLECTOMY    . TUBAL LIGATION       Current Outpatient Medications  Medication Sig Dispense Refill  . ACCU-CHEK AVIVA PLUS test strip     . Accu-Chek Softclix Lancets lancets     . acetaminophen (TYLENOL) 500 MG tablet Take 2 tablets (1,000 mg total) by mouth 3 (three) times daily. 30 tablet 0  . B-D UF III MINI PEN NEEDLES 31G X 5 MM MISC     . Blood Glucose Monitoring Suppl (ACCU-CHEK AVIVA PLUS) w/Device KIT     . calcium carbonate (CALCIUM 600) 600 MG TABS tablet Take 600 mg by mouth daily with breakfast.    . Cholecalciferol (VITAMIN D) 2000 UNITS tablet Take 2,000 Units by mouth daily.    . cyclobenzaprine (FLEXERIL) 10 MG tablet Take 10 mg by mouth 3 (three) times daily as needed for muscle spasms.    Marland Kitchen doxylamine, Sleep, (UNISOM) 25 MG tablet Take 25 mg by mouth at bedtime.    . DULoxetine (CYMBALTA) 20 MG capsule Take 20 mg by mouth at bedtime.     . empagliflozin (JARDIANCE) 10 MG TABS tablet Take 1 tablet (10 mg total) by mouth daily before breakfast. 90 tablet 3  . gabapentin (NEURONTIN) 300 MG capsule Take 300 mg by mouth at bedtime.     Marland Kitchen glucose blood (ACCU-CHEK AVIVA PLUS) test strip TK TO TEST BLOOD SUGAR QD    . glucose blood (ACCU-CHEK AVIVA PLUS) test strip daily    . hydrALAZINE (APRESOLINE) 100 MG tablet Take 1 tablet (100 mg total) by mouth 3 (three) times daily. 90 tablet 6  . Insulin Degludec (TRESIBA FLEXTOUCH [place]) Inject 90 Units into the skin daily.    Marland Kitchen labetalol (NORMODYNE) 200 MG tablet TAKE 1 TABLET(200 MG) BY MOUTH TWICE DAILY 180 tablet 3  . magnesium oxide (MAG-OX) 400 MG tablet Take 400 mg by mouth daily.    Marland Kitchen omeprazole (PRILOSEC OTC)  20 MG tablet Take 20 mg by mouth daily.     Marland Kitchen oxyCODONE-acetaminophen (PERCOCET/ROXICET) 5-325 MG tablet Take 1 tablet by mouth every 4 (four) hours as needed for severe pain.    . pramipexole (MIRAPEX) 0.125 MG tablet Take 0.25 mg by mouth 2 (two) times daily.    . rosuvastatin (CRESTOR) 10 MG tablet Take 10 mg by mouth daily.     Marland Kitchen torsemide (DEMADEX) 20 MG tablet Take 2 tablets (40 mg total) by mouth daily. Take extra at 2pm if weight is 216 or greater 225 tablet 3  No current facility-administered medications for this encounter.    Allergies:   Penicillins, Codeine, Adhesive [tape], Aspirin, Iodine, Ivp dye [iodinated diagnostic agents], and Sulfa antibiotics   Social History:  The patient  reports that she quit smoking about 37 years ago. She started smoking about 67 years ago. She has never used smokeless tobacco. She reports current alcohol use. She reports that she does not use drugs.   Family History:  The patient's family history includes CAD in an other family member; Sick sinus syndrome in her brother.   ROS:  Please see the history of present illness.   All other systems are personally reviewed and negative.   Vitals:   10/26/20 1128  BP: 140/70  Pulse: 80  SpO2: 96%  Weight: 101.6 kg (224 lb)    Exam:   General:  Obese female No resp difficulty HEENT: normal Neck: supple. no JVD. Carotids 2+ bilat; no bruits. No lymphadenopathy or thryomegaly appreciated. Cor: PMI nondisplaced. Regular rate & rhythm. 2/6 AS Lungs: clear Abdomen: obese soft, nontender, nondistended. No hepatosplenomegaly. No bruits or masses. Good bowel sounds. Extremities: no cyanosis, clubbing, rash, 1+ lymphedema at ankles Neuro: alert & orientedx3, cranial nerves grossly intact. moves all 4 extremities w/o difficulty. Affect pleasant   Recent Labs: 12/10/2019: B Natriuretic Peptide 47.3 05/20/2020: Magnesium 2.1 05/21/2020: BUN 52; Creatinine, Ser 1.72; Hemoglobin 10.1; Platelets 146; Potassium  4.7; Sodium 139  Personally reviewed   Wt Readings from Last 3 Encounters:  10/26/20 101.6 kg (224 lb)  05/23/20 101.2 kg (223 lb 1.7 oz)  04/20/20 95.3 kg (210 lb)      ASSESSMENT AND PLAN:  1. Chronic Diastolic Heart Failure  - Echo 05/14/18: EF 50-55%, posterior basal and inferolateral HK, mild LVH, LA mildly dilated - R/LHC 05/19/18: normal coronaries, elevated filling pressure (had held diuretics x2 days), severe HTN. - Stable NYHA III dyspnea - Volume status looks ok.  LE edema appears to be mostly venous insufficiency - Continue torsemide 40 daily. - Continue compression hose or leg wraps for LE lymphedema.  - Hold off on spiro with previous AKI and hyperkalemia - Continue Jardiance - I don't think volume overload is the main issue here but certainly could be contributing. I suspect main issues are obesity and deconditioning. Given ongoing NYHA III symptoms with check RHC to assess filling pressures and look for PH.  - [place] repeat echo to recheck EF and AS murmur  2. HTN, severe, - Blood pressure well controlled. Continue current regimen.  3. OSA  - severe, AHI 49 by PSG in 10/19 - f/u testing AHI 11 - Had televisit with Dr. Radford Pax on 09/30/19. She refused CPAP due to claustrophobia.  - Continues to refuse   4. Dyspnea - LHC 05/19/18 with normal coronaries - PFTs with DLCO 70% - Likely mostly due to obesity + OSA - Stressed need again for weight loss with low-carb diet  - See plan as above  5. CKD Stage IIIb-IV - Creatinine 1.6 in 2/21.  - Labs today - Has appt with [facility]  6. Sinus node dysfunction s/p MDT PPM - Follows with Dr Caryl Comes - Good HR variation with walking around clinic last visit 64 resting to 95 bpm walking  7. DM - Continue Jardiance  8. Severe obesity - again stressed need for more activity and [place] Body mass index is 39.68 kg/m.     Glori Bickers, MD  10/26/2020 11:45 AM  Advanced Heart Failure  [facility] [address]  [address] Heart and [place] [address] [phone] (office) [phone] (fax)

## 2020-10-26 ENCOUNTER — Ambulatory Visit (INDEPENDENT_AMBULATORY_CARE_PROVIDER_SITE_OTHER): Payer: Medicare PPO

## 2020-10-26 ENCOUNTER — Other Ambulatory Visit: Payer: Self-pay

## 2020-10-26 ENCOUNTER — Other Ambulatory Visit (HOSPITAL_COMMUNITY): Payer: Self-pay

## 2020-10-26 ENCOUNTER — Encounter (HOSPITAL_COMMUNITY): Payer: Self-pay | Admitting: Internal Medicine

## 2020-10-26 ENCOUNTER — Ambulatory Visit: Payer: Medicare PPO | Admitting: Podiatry

## 2020-10-26 ENCOUNTER — Encounter: Payer: Self-pay | Admitting: Podiatry

## 2020-10-26 ENCOUNTER — Ambulatory Visit (HOSPITAL_COMMUNITY)
Admission: RE | Admit: 2020-10-26 | Discharge: 2020-10-26 | Disposition: A | Discharge status: Home / Self Care | Payer: Medicare PPO | Source: Ambulatory Visit | Attending: Internal Medicine | Admitting: Internal Medicine

## 2020-10-26 VITALS — BP 140/70 | HR 80 | Wt 224.0 lb

## 2020-10-26 DIAGNOSIS — G2581 Restless legs syndrome: Secondary | ICD-10-CM | POA: Insufficient documentation

## 2020-10-26 DIAGNOSIS — E1122 Type 2 diabetes mellitus with diabetic chronic kidney disease: Secondary | ICD-10-CM | POA: Insufficient documentation

## 2020-10-26 DIAGNOSIS — Z905 Acquired absence of kidney: Secondary | ICD-10-CM | POA: Diagnosis not present

## 2020-10-26 DIAGNOSIS — I509 Heart failure, unspecified: Secondary | ICD-10-CM | POA: Insufficient documentation

## 2020-10-26 DIAGNOSIS — M14679 Charcot's joint, unspecified ankle and foot: Secondary | ICD-10-CM

## 2020-10-26 DIAGNOSIS — M79671 Pain in right foot: Secondary | ICD-10-CM

## 2020-10-26 DIAGNOSIS — I10 Essential (primary) hypertension: Secondary | ICD-10-CM

## 2020-10-26 DIAGNOSIS — I5022 Chronic systolic (congestive) heart failure: Secondary | ICD-10-CM

## 2020-10-26 DIAGNOSIS — Z950 Presence of cardiac pacemaker: Secondary | ICD-10-CM | POA: Insufficient documentation

## 2020-10-26 DIAGNOSIS — Z794 Long term (current) use of insulin: Secondary | ICD-10-CM | POA: Diagnosis not present

## 2020-10-26 DIAGNOSIS — E1149 Type 2 diabetes mellitus with other diabetic neurological complication: Secondary | ICD-10-CM

## 2020-10-26 DIAGNOSIS — I495 Sick sinus syndrome: Secondary | ICD-10-CM | POA: Diagnosis not present

## 2020-10-26 DIAGNOSIS — I5032 Chronic diastolic (congestive) heart failure: Secondary | ICD-10-CM

## 2020-10-26 DIAGNOSIS — M14672 Charcot's joint, left ankle and foot: Secondary | ICD-10-CM | POA: Diagnosis not present

## 2020-10-26 DIAGNOSIS — Z79899 Other long term (current) drug therapy: Secondary | ICD-10-CM | POA: Diagnosis not present

## 2020-10-26 DIAGNOSIS — M14671 Charcot's joint, right ankle and foot: Secondary | ICD-10-CM | POA: Diagnosis not present

## 2020-10-26 DIAGNOSIS — Z6839 Body mass index (BMI) 39.0-39.9, adult: Secondary | ICD-10-CM | POA: Diagnosis not present

## 2020-10-26 DIAGNOSIS — E785 Hyperlipidemia, unspecified: Secondary | ICD-10-CM | POA: Insufficient documentation

## 2020-10-26 DIAGNOSIS — Z87891 Personal history of nicotine dependence: Secondary | ICD-10-CM | POA: Diagnosis not present

## 2020-10-26 DIAGNOSIS — N1832 Chronic kidney disease, stage 3b: Secondary | ICD-10-CM

## 2020-10-26 DIAGNOSIS — M79672 Pain in left foot: Secondary | ICD-10-CM

## 2020-10-26 DIAGNOSIS — G4733 Obstructive sleep apnea (adult) (pediatric): Secondary | ICD-10-CM

## 2020-10-26 DIAGNOSIS — G8929 Other chronic pain: Secondary | ICD-10-CM | POA: Insufficient documentation

## 2020-10-26 DIAGNOSIS — E7800 Pure hypercholesterolemia, unspecified: Secondary | ICD-10-CM | POA: Insufficient documentation

## 2020-10-26 DIAGNOSIS — E1140 Type 2 diabetes mellitus with diabetic neuropathy, unspecified: Secondary | ICD-10-CM | POA: Insufficient documentation

## 2020-10-26 DIAGNOSIS — E559 Vitamin D deficiency, unspecified: Secondary | ICD-10-CM | POA: Insufficient documentation

## 2020-10-26 DIAGNOSIS — N184 Chronic kidney disease, stage 4 (severe): Secondary | ICD-10-CM | POA: Diagnosis not present

## 2020-10-26 DIAGNOSIS — M21969 Unspecified acquired deformity of unspecified lower leg: Secondary | ICD-10-CM | POA: Insufficient documentation

## 2020-10-26 DIAGNOSIS — I130 Hypertensive heart and chronic kidney disease with heart failure and stage 1 through stage 4 chronic kidney disease, or unspecified chronic kidney disease: Secondary | ICD-10-CM | POA: Insufficient documentation

## 2020-10-26 HISTORY — DX: Heart failure, unspecified: I50.9

## 2020-10-26 LAB — CBC
HCT: 39.2 % (ref 36.0–46.0)
Hemoglobin: 11.9 g/dL — ABNORMAL LOW (ref 12.0–15.0)
MCH: 26.9 pg (ref 26.0–34.0)
MCHC: 30.4 g/dL (ref 30.0–36.0)
MCV: 88.5 fL (ref 80.0–100.0)
Platelets: 162 10*3/uL (ref 150–400)
RBC: 4.43 MIL/uL (ref 3.87–5.11)
RDW: 15.2 % (ref 11.5–15.5)
WBC: 6.1 10*3/uL (ref 4.0–10.5)
nRBC: 0 % (ref 0.0–0.2)

## 2020-10-26 LAB — BASIC METABOLIC PANEL
Anion gap: 9 (ref 5–15)
BUN: 57 mg/dL — ABNORMAL HIGH (ref 8–23)
CO2: 31 mmol/L (ref 22–32)
Calcium: 9.5 mg/dL (ref 8.9–10.3)
Chloride: 100 mmol/L (ref 98–111)
Creatinine, Ser: 0.76 mg/dL (ref 0.44–1.00)
GFR, Estimated: >60 mL/min (ref >60)
Glucose, Bld: 79 mg/dL (ref 70–99)
Potassium: 4.3 mmol/L (ref 3.5–5.1)
Sodium: 140 mmol/L (ref 135–145)

## 2020-10-26 NOTE — Patient Instructions (Signed)
Labs done today, your results will be available in MyChart, we will contact you for abnormal readings.  Your physician has requested that you have an echocardiogram. Echocardiography is a painless test that uses sound waves to create images of your heart. It provides your doctor with information about the size and shape of your heart and how well your heart's chambers and valves are working. This procedure takes approximately one hour. There are no restrictions for this procedure.  Please call our office in August to schedule your follow up appointment  If you have any questions or concerns before your next appointment please send [place] a message through [place] or call our office at [phone].    TO LEAVE A MESSAGE FOR THE NURSE SELECT OPTION 2, PLEASE LEAVE A MESSAGE INCLUDING: . YOUR NAME . DATE OF BIRTH . CALL BACK NUMBER . REASON FOR CALL**this is important as we prioritize the call backs  YOU WILL RECEIVE A CALL BACK THE SAME DAY AS LONG AS YOU CALL BEFORE 4:00 PM    HEART CATHETERIZATION INSTRUCTIONS  You are scheduled for a Cardiac Catheterization on Wednesday, February 23 with Dr. Glori Bickers.  1. Please arrive at the [facility] (Main Entrance A) at [facility]: [address] [place], [place] [address] at 6:30 AM (This time is two hours before your procedure to ensure your preparation). Free valet parking service is available.   Special note: Every effort is made to have your procedure done on time. Please understand that emergencies sometimes delay scheduled procedures.  2. Diet: Do not eat solid foods after midnight.  The patient may have clear liquids until 5am upon the day of the procedure.  3. COVID TEST: Monday 2/21 at 10:50am   [place], [place] [address]  4. Medication instructions in preparation for your procedure:  DO NOT TAKE TORSEMIDE Wednesday MORNING  CUT YOUR INSULIN DOSE IN HALF Tuesday NIGHT AND DO NOT TAKE Wednesday  MORNING   On the morning of your procedure, take any morning medicines NOT listed above.  You may use sips of water.  5. Plan for one night stay--bring personal belongings. 6. Bring a current list of your medications and current insurance cards. 7. You MUST have a responsible person to drive you home. 8. Someone MUST be with you the first 24 hours after you arrive home or your discharge will be delayed. 9. Please wear clothes that are easy to get on and off and wear slip-on shoes.  Thank you for allowing [place] to care for you!   -- [facility]

## 2020-10-26 NOTE — Addendum Note (Signed)
Encounter addended by: Jolaine Artist, MD on: 10/26/2020 1:04 PM  Actions taken: Level of Service modified, Visit diagnoses modified

## 2020-10-27 ENCOUNTER — Other Ambulatory Visit: Payer: Self-pay | Admitting: Podiatry

## 2020-10-27 DIAGNOSIS — R2689 Other abnormalities of gait and mobility: Secondary | ICD-10-CM | POA: Diagnosis not present

## 2020-10-27 DIAGNOSIS — E1140 Type 2 diabetes mellitus with diabetic neuropathy, unspecified: Secondary | ICD-10-CM

## 2020-10-27 DIAGNOSIS — M14671 Charcot's joint, right ankle and foot: Secondary | ICD-10-CM

## 2020-10-27 DIAGNOSIS — M14672 Charcot's joint, left ankle and foot: Secondary | ICD-10-CM

## 2020-10-27 NOTE — Progress Notes (Signed)
Subjective:   Patient ID: [name], female   DOB: 80 y.o.   MRN: [id number]   HPI Patient presents with her daughter stating that she has severe flatfeet chronic swelling of her ankles and is unable to find shoes that can accommodate deformity.  Patient states that they are both a problem the left is worse than the right and this is been going on for a number of years with long-term diabetes and moderate obesity.  Patient does not smoke and is not active   Review of Systems  All other systems reviewed and are negative.       Objective:  Physical Exam Vitals and nursing note reviewed.  Constitutional:      Appearance: She is well-developed and well-nourished.  Cardiovascular:     Pulses: Intact distal pulses.  Pulmonary:     Effort: Pulmonary effort is normal.  Musculoskeletal:        General: Normal range of motion.  Skin:    General: Skin is warm.  Neurological:     Mental Status: She is alert.     Neurovascular status indicates diminishment of pulses DP PT but present with moderate diminishment sharp dull vibratory bilateral.  Patient does have significant collapse medial longitudinal arch with history of Charcot foot structure left over right and does have venous insufficiency.  Patient is a long-term diabetic with risk factors associated with that also     Assessment:  Patient who has multiple risk factors including long-term diabetes moderate obesity venous insufficiency with Charcot foot structure left over right     Plan:  H&P reviewed condition and recommended to attempt diabetic shoes with the possibility that she will need molded shoes.  We are going to have her see the ped orthotist and be measured for diabetic shoes and we will decide on whether or not this is an appropriate help for this patient  X-rays indicate that there is severe collapse medial longitudinal arch left over right

## 2020-10-31 ENCOUNTER — Other Ambulatory Visit (HOSPITAL_COMMUNITY)
Admission: RE | Admit: 2020-10-31 | Discharge: 2020-10-31 | Disposition: A | Discharge status: Home / Self Care | Payer: Medicare PPO | Source: Ambulatory Visit | Attending: Internal Medicine | Admitting: Internal Medicine

## 2020-10-31 DIAGNOSIS — Z20822 Contact with and (suspected) exposure to covid-19: Secondary | ICD-10-CM | POA: Diagnosis not present

## 2020-10-31 DIAGNOSIS — Z01812 Encounter for preprocedural laboratory examination: Secondary | ICD-10-CM | POA: Diagnosis not present

## 2020-10-31 LAB — SARS CORONAVIRUS 2 (TAT 6-24 HRS): SARS Coronavirus 2: NEGATIVE

## 2020-11-02 ENCOUNTER — Other Ambulatory Visit: Payer: Self-pay

## 2020-11-02 ENCOUNTER — Encounter (HOSPITAL_COMMUNITY): Payer: Self-pay | Admitting: Internal Medicine

## 2020-11-02 ENCOUNTER — Ambulatory Visit (HOSPITAL_COMMUNITY)
Admission: RE | Admit: 2020-11-02 | Discharge: 2020-11-02 | Disposition: A | Discharge status: Home / Self Care | Payer: Medicare PPO | Attending: Internal Medicine | Admitting: Internal Medicine

## 2020-11-02 ENCOUNTER — Encounter (HOSPITAL_COMMUNITY)
Admission: RE | Disposition: A | Discharge status: Home / Self Care | Payer: Self-pay | Source: Home / Self Care | Attending: Internal Medicine

## 2020-11-02 DIAGNOSIS — Z6839 Body mass index (BMI) 39.0-39.9, adult: Secondary | ICD-10-CM | POA: Insufficient documentation

## 2020-11-02 DIAGNOSIS — G4733 Obstructive sleep apnea (adult) (pediatric): Secondary | ICD-10-CM | POA: Diagnosis not present

## 2020-11-02 DIAGNOSIS — Z794 Long term (current) use of insulin: Secondary | ICD-10-CM | POA: Insufficient documentation

## 2020-11-02 DIAGNOSIS — I5032 Chronic diastolic (congestive) heart failure: Secondary | ICD-10-CM | POA: Diagnosis not present

## 2020-11-02 DIAGNOSIS — Z79899 Other long term (current) drug therapy: Secondary | ICD-10-CM | POA: Insufficient documentation

## 2020-11-02 DIAGNOSIS — Z950 Presence of cardiac pacemaker: Secondary | ICD-10-CM | POA: Diagnosis not present

## 2020-11-02 DIAGNOSIS — I495 Sick sinus syndrome: Secondary | ICD-10-CM | POA: Insufficient documentation

## 2020-11-02 DIAGNOSIS — E1122 Type 2 diabetes mellitus with diabetic chronic kidney disease: Secondary | ICD-10-CM | POA: Diagnosis not present

## 2020-11-02 DIAGNOSIS — Z87891 Personal history of nicotine dependence: Secondary | ICD-10-CM | POA: Insufficient documentation

## 2020-11-02 DIAGNOSIS — R0609 Other forms of dyspnea: Secondary | ICD-10-CM | POA: Diagnosis not present

## 2020-11-02 DIAGNOSIS — I5022 Chronic systolic (congestive) heart failure: Secondary | ICD-10-CM

## 2020-11-02 DIAGNOSIS — N1832 Chronic kidney disease, stage 3b: Secondary | ICD-10-CM | POA: Insufficient documentation

## 2020-11-02 DIAGNOSIS — I130 Hypertensive heart and chronic kidney disease with heart failure and stage 1 through stage 4 chronic kidney disease, or unspecified chronic kidney disease: Secondary | ICD-10-CM | POA: Diagnosis not present

## 2020-11-02 HISTORY — PX: RIGHT HEART CATH: CATH118263

## 2020-11-02 LAB — GLUCOSE, CAPILLARY
Glucose-Capillary: 64 mg/dL — ABNORMAL LOW (ref 70–99)
Glucose-Capillary: 119 mg/dL — ABNORMAL HIGH (ref 70–99)

## 2020-11-02 LAB — POCT I-STAT EG7
Acid-Base Excess: 3 mmol/L — ABNORMAL HIGH (ref 0.0–2.0)
Acid-Base Excess: 6 mmol/L — ABNORMAL HIGH (ref 0.0–2.0)
Acid-Base Excess: 6 mmol/L — ABNORMAL HIGH (ref 0.0–2.0)
Bicarbonate: 28.5 mmol/L — ABNORMAL HIGH (ref 20.0–28.0)
Bicarbonate: 31.3 mmol/L — ABNORMAL HIGH (ref 20.0–28.0)
Bicarbonate: 31.6 mmol/L — ABNORMAL HIGH (ref 20.0–28.0)
Calcium, Ion: 0.95 mmol/L — ABNORMAL LOW (ref 1.15–1.40)
Calcium, Ion: 1.2 mmol/L (ref 1.15–1.40)
Calcium, Ion: 1.25 mmol/L (ref 1.15–1.40)
HCT: 30 % — ABNORMAL LOW (ref 36.0–46.0)
HCT: 34 % — ABNORMAL LOW (ref 36.0–46.0)
HCT: 35 % — ABNORMAL LOW (ref 36.0–46.0)
Hemoglobin: 10.2 g/dL — ABNORMAL LOW (ref 12.0–15.0)
Hemoglobin: 11.6 g/dL — ABNORMAL LOW (ref 12.0–15.0)
Hemoglobin: 11.9 g/dL — ABNORMAL LOW (ref 12.0–15.0)
O2 Saturation: 69 %
O2 Saturation: 73 %
O2 Saturation: 76 %
Potassium: 3 mmol/L — ABNORMAL LOW (ref 3.5–5.1)
Potassium: 3.6 mmol/L (ref 3.5–5.1)
Potassium: 3.7 mmol/L (ref 3.5–5.1)
Sodium: 141 mmol/L (ref 135–145)
Sodium: 142 mmol/L (ref 135–145)
Sodium: 147 mmol/L — ABNORMAL HIGH (ref 135–145)
TCO2: 30 mmol/L (ref 22–32)
TCO2: 33 mmol/L — ABNORMAL HIGH (ref 22–32)
TCO2: 33 mmol/L — ABNORMAL HIGH (ref 22–32)
pCO2, Ven: 47.2 mmHg (ref 44.0–60.0)
pCO2, Ven: 48 mmHg (ref 44.0–60.0)
pCO2, Ven: 49.8 mmHg (ref 44.0–60.0)
pH, Ven: 7.389 (ref 7.250–7.430)
pH, Ven: 7.411 (ref 7.250–7.430)
pH, Ven: 7.422 (ref 7.250–7.430)
pO2, Ven: 37 mmHg (ref 32.0–45.0)
pO2, Ven: 39 mmHg (ref 32.0–45.0)
pO2, Ven: 40 mmHg (ref 32.0–45.0)

## 2020-11-02 SURGERY — RIGHT HEART CATH
Anesthesia: LOCAL

## 2020-11-02 MED ORDER — ASPIRIN 81 MG PO CHEW: 81.0000 mg | CHEWABLE_TABLET | ORAL | Status: DC

## 2020-11-02 MED ORDER — HYDRALAZINE HCL 20 MG/ML IJ SOLN
10.0000 mg | INTRAMUSCULAR | Status: DC | PRN
Start: 2020-11-02 — End: 2020-11-02

## 2020-11-02 MED ORDER — DEXTROSE 50 % IV SOLN
INTRAVENOUS | Status: AC
  Filled 2020-11-02: qty 50

## 2020-11-02 MED ORDER — ACETAMINOPHEN 325 MG PO TABS: 650.0000 mg | ORAL_TABLET | ORAL | Status: DC | PRN

## 2020-11-02 MED ORDER — SODIUM CHLORIDE 0.9 % IV SOLN: INTRAVENOUS | Status: DC

## 2020-11-02 MED ORDER — SODIUM CHLORIDE 0.9% FLUSH: 3.0000 mL | Freq: Two times a day (BID) | INTRAVENOUS | Status: DC

## 2020-11-02 MED ORDER — ONDANSETRON HCL 4 MG/2ML IJ SOLN: 4.0000 mg | Freq: Four times a day (QID) | INTRAMUSCULAR | Status: DC | PRN

## 2020-11-02 MED ORDER — DEXTROSE 50 % IV SOLN
25.0000 mL | Freq: Once | INTRAVENOUS | Status: AC
  Administered 2020-11-02: 25 mL via INTRAVENOUS

## 2020-11-02 MED ORDER — SODIUM CHLORIDE 0.9 % IV SOLN: 250.0000 mL | INTRAVENOUS | Status: DC | PRN

## 2020-11-02 MED ORDER — SODIUM CHLORIDE 0.9% FLUSH: 3.0000 mL | INTRAVENOUS | Status: DC | PRN

## 2020-11-02 MED ORDER — LIDOCAINE HCL (PF) 1 % IJ SOLN
INTRAMUSCULAR | Status: AC
  Filled 2020-11-02: qty 30

## 2020-11-02 MED ORDER — HEPARIN (PORCINE) IN NACL 1000-0.9 UT/500ML-% IV SOLN
INTRAVENOUS | Status: DC | PRN
  Administered 2020-11-02: 500 mL

## 2020-11-02 MED ORDER — HEPARIN (PORCINE) IN NACL 1000-0.9 UT/500ML-% IV SOLN
INTRAVENOUS | Status: AC
  Filled 2020-11-02: qty 500

## 2020-11-02 MED ORDER — LABETALOL HCL 5 MG/ML IV SOLN: 10.0000 mg | INTRAVENOUS | Status: DC | PRN

## 2020-11-02 MED ORDER — LIDOCAINE HCL (PF) 1 % IJ SOLN
INTRAMUSCULAR | Status: DC | PRN
  Administered 2020-11-02: 2 mL

## 2020-11-02 SURGICAL SUPPLY — 7 items
CATH BALLN WEDGE 5F 110CM (CATHETERS) ×2 IMPLANT
GUIDEWIRE .025 260CM (WIRE) ×2 IMPLANT
KIT MICROPUNCTURE NIT STIFF (SHEATH) ×2 IMPLANT
PACK CARDIAC CATHETERIZATION (CUSTOM PROCEDURE TRAY) ×2 IMPLANT
SHEATH GLIDE SLENDER 4/5FR (SHEATH) ×2 IMPLANT
SHEATH PROBE COVER 6X72 (BAG) ×2 IMPLANT
TRANSDUCER W/STOPCOCK (MISCELLANEOUS) ×2 IMPLANT

## 2020-11-02 NOTE — Interval H&P Note (Signed)
History and Physical Interval Note:  11/02/2020 8:28 AM  [name]  has presented today for surgery, with the diagnosis of heart failure/pulmonary HTN.  The various methods of treatment have been discussed with the patient and family. After consideration of risks, benefits and other options for treatment, the patient has consented to  Procedure(s): RIGHT HEART CATH (N/A) as a surgical intervention.  The patient's history has been reviewed, patient examined, no change in status, stable for surgery.  I have reviewed the patient's chart and labs.  Questions were answered to the patient's satisfaction.     [name]

## 2020-11-02 NOTE — Discharge Instructions (Signed)
Brachial Site Care   This sheet gives you information about how to care for yourself after your procedure. Your health care provider may also give you more specific instructions. If you have problems or questions, contact your health care provider. What can I expect after the procedure? After the procedure, it is common to have:  Bruising and tenderness at the catheter insertion area. Follow these instructions at home: Medicines  Take over-the-counter and prescription medicines only as told by your health care provider. Insertion site care 1. Follow instructions from your health care provider about how to take care of your insertion site. Make sure you: ? Wash your hands with soap and water before you change your bandage (dressing). If soap and water are not available, use hand sanitizer. ? Remove your dressing as told by your health care provider. In 24 hours 2. Check your insertion site every day for signs of infection. Check for: ? Redness, swelling, or pain. ? Fluid or blood. ? Pus or a bad smell. ? Warmth. 3. Do not take baths, swim, or use a hot tub until your health care provider approves. 4. You may shower 24-48 hours after the procedure, or as directed by your health care provider. ? Remove the dressing and gently wash the site with plain soap and water. ? Pat the area dry with a clean towel. ? Do not rub the site. That could cause bleeding. 5. Do not apply powder or lotion to the site. Activity    1. Do not lift anything that is heavier than 10 lb (4.5 kg), or the limit that you are told, until your health care provider says that it is safe.  For 2 days 2. Ask your health care provider when it is okay to: ? Return to work or school. ? Resume usual physical activities or sports. ? Resume sexual activity. General instructions  If the catheter site starts to bleed, raise your arm and put firm pressure on the site.   If you went home on the same day as your procedure, a  responsible adult should be with you for the first 24 hours after you arrive home.  Keep all follow-up visits as told by your health care provider. This is important. Contact a health care provider if:  You have a fever.  You have redness, swelling, or yellow drainage around your insertion site. Get help right away if:  You have unusual pain at the brachial site.  The catheter insertion area swells very fast.  The insertion area is bleeding, and the bleeding does not stop when you hold steady pressure on the area. These symptoms may represent a serious problem that is an emergency. Do not wait to see if the symptoms will go away. Get medical help right away. Call your local emergency services (911 in the [place] Do not drive yourself to the hospital. Summary  After the procedure, it is common to have bruising and tenderness at the site.  Follow instructions from your health care provider about how to take care of your radial site wound. Check the wound every day for signs of infection.  Do not lift anything that is heavier than 10 lb (4.5 kg), or the limit that you are told, until your health care provider says that it is safe. This information is not intended to replace advice given to you by your health care provider. Make sure you discuss any questions you have with your health care provider. Document Revised: 10/02/2017 Document Reviewed: 10/02/2017  Elsevier Patient Education  [employer].

## 2020-11-04 DIAGNOSIS — E118 Type 2 diabetes mellitus with unspecified complications: Secondary | ICD-10-CM | POA: Diagnosis not present

## 2020-11-04 DIAGNOSIS — I10 Essential (primary) hypertension: Secondary | ICD-10-CM | POA: Diagnosis not present

## 2020-11-04 DIAGNOSIS — E876 Hypokalemia: Secondary | ICD-10-CM | POA: Diagnosis not present

## 2020-11-04 DIAGNOSIS — E559 Vitamin D deficiency, unspecified: Secondary | ICD-10-CM | POA: Diagnosis not present

## 2020-11-04 DIAGNOSIS — E7800 Pure hypercholesterolemia, unspecified: Secondary | ICD-10-CM | POA: Diagnosis not present

## 2020-11-10 DIAGNOSIS — H52223 Regular astigmatism, bilateral: Secondary | ICD-10-CM | POA: Diagnosis not present

## 2020-11-10 DIAGNOSIS — E119 Type 2 diabetes mellitus without complications: Secondary | ICD-10-CM | POA: Diagnosis not present

## 2020-11-10 DIAGNOSIS — I10 Essential (primary) hypertension: Secondary | ICD-10-CM | POA: Diagnosis not present

## 2020-11-10 DIAGNOSIS — H2513 Age-related nuclear cataract, bilateral: Secondary | ICD-10-CM | POA: Diagnosis not present

## 2020-11-14 ENCOUNTER — Ambulatory Visit (INDEPENDENT_AMBULATORY_CARE_PROVIDER_SITE_OTHER): Payer: Medicare PPO | Admitting: Podiatry

## 2020-11-14 ENCOUNTER — Other Ambulatory Visit: Payer: Self-pay

## 2020-11-14 DIAGNOSIS — M14679 Charcot's joint, unspecified ankle and foot: Secondary | ICD-10-CM

## 2020-11-14 DIAGNOSIS — E1140 Type 2 diabetes mellitus with diabetic neuropathy, unspecified: Secondary | ICD-10-CM

## 2020-11-14 DIAGNOSIS — E1149 Type 2 diabetes mellitus with other diabetic neurological complication: Secondary | ICD-10-CM

## 2020-11-14 NOTE — Progress Notes (Signed)
Patient presented for foam casting for 3 pair custom diabetic shoe inserts. Patient is measured with a [employer] device to be a size 9 wide.  Diabetic shoes are chosen from the [employer] Catalog. The shoes chosen are 1201  The patient stated that she had tried a AFO brace from either [employer] or [employer] and did not like it due to it rubbed the outside of her foot.  The patient will be contracted when the shoes and inserts are ready to be picked up.

## 2020-11-21 ENCOUNTER — Telehealth: Payer: Self-pay | Admitting: Internal Medicine

## 2020-11-21 NOTE — Telephone Encounter (Signed)
*  STAT* If patient is at the pharmacy, call can be transferred to refill team.   1. Which medications need to be refilled? (please list name of each medication and dose if known) torsemide (DEMADEX) 20 MG tablet  2. Which pharmacy/location (including street and city if local pharmacy) is medication to be sent to? [facility], [place]  3. Do they need a 30 day or 90 day supply? 90 day

## 2020-11-22 MED ORDER — TORSEMIDE 20 MG PO TABS: 40.0000 mg | ORAL_TABLET | Freq: Every day | ORAL | 0 refills | Status: DC

## 2020-11-22 NOTE — Telephone Encounter (Signed)
Pt's medication was sent to pt's pharmacy as requested. Confirmation received.  °

## 2020-11-23 ENCOUNTER — Ambulatory Visit (HOSPITAL_COMMUNITY): Payer: Medicare PPO

## 2020-11-29 DIAGNOSIS — M6788 Other specified disorders of synovium and tendon, other site: Secondary | ICD-10-CM | POA: Diagnosis not present

## 2020-12-15 ENCOUNTER — Other Ambulatory Visit (HOSPITAL_COMMUNITY): Payer: Self-pay | Admitting: Chiropractic Medicine

## 2020-12-15 DIAGNOSIS — M5459 Other low back pain: Secondary | ICD-10-CM

## 2020-12-15 DIAGNOSIS — M545 Low back pain, unspecified: Secondary | ICD-10-CM

## 2020-12-16 ENCOUNTER — Ambulatory Visit (INDEPENDENT_AMBULATORY_CARE_PROVIDER_SITE_OTHER): Payer: Medicare PPO

## 2020-12-16 DIAGNOSIS — I495 Sick sinus syndrome: Secondary | ICD-10-CM | POA: Diagnosis not present

## 2020-12-19 ENCOUNTER — Telehealth: Payer: Self-pay | Admitting: Podiatry

## 2020-12-19 NOTE — Telephone Encounter (Signed)
Pt left message checking on status of diabetic shoes.  I returned call and told pt we have not received the documents from her pcp and we have faxed them multiple times. She was going to call the pcp office.

## 2020-12-20 LAB — CUP PACEART REMOTE DEVICE CHECK
Battery Remaining Longevity: 60 mo
Battery Voltage: 2.99 V
Brady Statistic AP VP Percent: 0.11 %
Brady Statistic AP VS Percent: 99.39 %
Brady Statistic AS VP Percent: 0 %
Brady Statistic AS VS Percent: 0.5 %
Brady Statistic RA Percent Paced: 99.38 %
Brady Statistic RV Percent Paced: 0.12 %
Date Time Interrogation Session: 20220408054159
Implantable Lead Implant Date: 20161110
Implantable Lead Implant Date: 20161110
Implantable Lead Location: 753859
Implantable Lead Location: 753860
Implantable Lead Model: 5076
Implantable Lead Model: 5076
Implantable Pulse Generator Implant Date: 20161110
Lead Channel Impedance Value: 361 Ohm
Lead Channel Impedance Value: 361 Ohm
Lead Channel Impedance Value: 399 Ohm
Lead Channel Impedance Value: 418 Ohm
Lead Channel Pacing Threshold Amplitude: 0.5 V
Lead Channel Pacing Threshold Amplitude: 1.375 V
Lead Channel Pacing Threshold Pulse Width: 0.4 ms
Lead Channel Pacing Threshold Pulse Width: 0.4 ms
Lead Channel Sensing Intrinsic Amplitude: 2.625 mV
Lead Channel Sensing Intrinsic Amplitude: 2.625 mV
Lead Channel Sensing Intrinsic Amplitude: 5.5 mV
Lead Channel Sensing Intrinsic Amplitude: 5.5 mV
Lead Channel Setting Pacing Amplitude: 1.5 V
Lead Channel Setting Pacing Amplitude: 2.75 V
Lead Channel Setting Pacing Pulse Width: 0.4 ms
Lead Channel Setting Sensing Sensitivity: 2.8 mV

## 2020-12-29 ENCOUNTER — Ambulatory Visit (HOSPITAL_COMMUNITY)
Admission: RE | Admit: 2020-12-29 | Discharge: 2020-12-29 | Disposition: A | Discharge status: Home / Self Care | Payer: Medicare PPO | Source: Ambulatory Visit | Attending: Internal Medicine | Admitting: Internal Medicine

## 2020-12-29 ENCOUNTER — Other Ambulatory Visit: Payer: Self-pay

## 2020-12-29 DIAGNOSIS — I5022 Chronic systolic (congestive) heart failure: Secondary | ICD-10-CM | POA: Diagnosis not present

## 2020-12-29 NOTE — Progress Notes (Signed)
Remote pacemaker transmission.   

## 2020-12-29 NOTE — Progress Notes (Signed)
  Echocardiogram 2D Echocardiogram has been performed.  [name] 12/29/2020, 4:24 PM

## 2021-01-03 ENCOUNTER — Telehealth (HOSPITAL_COMMUNITY): Payer: Self-pay | Admitting: *Deleted

## 2021-01-03 NOTE — Telephone Encounter (Signed)
Pt left VM requesting echo results. No results have been released called pt to inform but voicemail not set up. Pt requested return call to phone number [phone]

## 2021-01-05 LAB — ECHOCARDIOGRAM COMPLETE
AR max vel: 1.44 cm2
AV Area VTI: 1.35 cm2
AV Area mean vel: 1.3 cm2
AV Mean grad: 21 mmHg
AV Peak grad: 32.5 mmHg
Ao pk vel: 2.85 m/s
Area-P 1/2: 2.17 cm2
S' Lateral: 3.1 cm

## 2021-01-06 ENCOUNTER — Other Ambulatory Visit: Payer: Self-pay

## 2021-01-06 ENCOUNTER — Ambulatory Visit (HOSPITAL_COMMUNITY)
Admission: RE | Admit: 2021-01-06 | Discharge: 2021-01-06 | Disposition: A | Discharge status: Home / Self Care | Payer: Medicare PPO | Source: Ambulatory Visit | Attending: Chiropractic Medicine | Admitting: Chiropractic Medicine

## 2021-01-06 DIAGNOSIS — M545 Low back pain, unspecified: Secondary | ICD-10-CM | POA: Insufficient documentation

## 2021-01-06 DIAGNOSIS — M5459 Other low back pain: Secondary | ICD-10-CM | POA: Insufficient documentation

## 2021-01-06 NOTE — Progress Notes (Signed)
Per order, Changed device settings for MRI to  DOO at 95 bpm  Will program device back to pre-MRI settings after completion of exam, and send transmission 

## 2021-01-10 ENCOUNTER — Telehealth (HOSPITAL_COMMUNITY): Payer: Self-pay | Admitting: *Deleted

## 2021-01-10 NOTE — Telephone Encounter (Signed)
Pt left vm requesting echo results. I called pt back and spoke with her daughter she is aware of results.

## 2021-01-24 DIAGNOSIS — H25043 Posterior subcapsular polar age-related cataract, bilateral: Secondary | ICD-10-CM | POA: Diagnosis not present

## 2021-01-24 DIAGNOSIS — H18413 Arcus senilis, bilateral: Secondary | ICD-10-CM | POA: Diagnosis not present

## 2021-01-24 DIAGNOSIS — H2513 Age-related nuclear cataract, bilateral: Secondary | ICD-10-CM | POA: Diagnosis not present

## 2021-01-24 DIAGNOSIS — H25013 Cortical age-related cataract, bilateral: Secondary | ICD-10-CM | POA: Diagnosis not present

## 2021-01-24 DIAGNOSIS — H2512 Age-related nuclear cataract, left eye: Secondary | ICD-10-CM | POA: Diagnosis not present

## 2021-01-26 ENCOUNTER — Telehealth: Payer: Self-pay | Admitting: Podiatry

## 2021-01-26 DIAGNOSIS — N2581 Secondary hyperparathyroidism of renal origin: Secondary | ICD-10-CM | POA: Diagnosis not present

## 2021-01-26 DIAGNOSIS — D631 Anemia in chronic kidney disease: Secondary | ICD-10-CM | POA: Diagnosis not present

## 2021-01-26 DIAGNOSIS — I5030 Unspecified diastolic (congestive) heart failure: Secondary | ICD-10-CM | POA: Diagnosis not present

## 2021-01-26 DIAGNOSIS — E1122 Type 2 diabetes mellitus with diabetic chronic kidney disease: Secondary | ICD-10-CM | POA: Diagnosis not present

## 2021-01-26 DIAGNOSIS — E785 Hyperlipidemia, unspecified: Secondary | ICD-10-CM | POA: Diagnosis not present

## 2021-01-26 DIAGNOSIS — N189 Chronic kidney disease, unspecified: Secondary | ICD-10-CM | POA: Diagnosis not present

## 2021-01-26 DIAGNOSIS — I129 Hypertensive chronic kidney disease with stage 1 through stage 4 chronic kidney disease, or unspecified chronic kidney disease: Secondary | ICD-10-CM | POA: Diagnosis not present

## 2021-01-26 DIAGNOSIS — N1832 Chronic kidney disease, stage 3b: Secondary | ICD-10-CM | POA: Diagnosis not present

## 2021-01-26 NOTE — Telephone Encounter (Signed)
Pt left message checking on status of diabetic shoes it has been over a month and she has not heard anything.  I called pts number she left and no voicemail is set up. So I called cell number and it was not pts name so I did not leave a message. We are waiting on inserts they are in production. It took a while to get the documents needed from pcp.

## 2021-01-30 ENCOUNTER — Telehealth: Payer: Self-pay | Admitting: Podiatry

## 2021-01-30 ENCOUNTER — Telehealth (HOSPITAL_COMMUNITY): Payer: Self-pay | Admitting: *Deleted

## 2021-01-30 NOTE — Telephone Encounter (Signed)
Received fax from [facility], pt needs clearance for cataract extraction  Per Dr Haroldine Laws pt is medically stable to proceed  Form faxed back to them at [phone]

## 2021-01-30 NOTE — Telephone Encounter (Signed)
Pt returned my call on Friday when I was out of the office and left message for me to call.  I returned call and explained that the inserts for her diabetic shoes are in production and I will call when they come in. It may take a few weeks with the holiday.

## 2021-01-31 DIAGNOSIS — M5416 Radiculopathy, lumbar region: Secondary | ICD-10-CM | POA: Diagnosis not present

## 2021-02-05 NOTE — Addendum Note (Signed)
Encounter addended by: Annie Paras on: 02/05/2021 11:27 AM  Actions taken: Letter saved

## 2021-02-08 ENCOUNTER — Other Ambulatory Visit: Payer: Self-pay | Admitting: Internal Medicine

## 2021-02-10 DIAGNOSIS — E7800 Pure hypercholesterolemia, unspecified: Secondary | ICD-10-CM | POA: Diagnosis not present

## 2021-02-10 DIAGNOSIS — E559 Vitamin D deficiency, unspecified: Secondary | ICD-10-CM | POA: Diagnosis not present

## 2021-02-10 DIAGNOSIS — I10 Essential (primary) hypertension: Secondary | ICD-10-CM | POA: Diagnosis not present

## 2021-02-10 DIAGNOSIS — E118 Type 2 diabetes mellitus with unspecified complications: Secondary | ICD-10-CM | POA: Diagnosis not present

## 2021-02-10 DIAGNOSIS — N289 Disorder of kidney and ureter, unspecified: Secondary | ICD-10-CM | POA: Diagnosis not present

## 2021-02-13 ENCOUNTER — Other Ambulatory Visit: Payer: Self-pay

## 2021-02-13 ENCOUNTER — Ambulatory Visit (INDEPENDENT_AMBULATORY_CARE_PROVIDER_SITE_OTHER): Payer: Medicare PPO | Admitting: Podiatry

## 2021-02-13 DIAGNOSIS — E1140 Type 2 diabetes mellitus with diabetic neuropathy, unspecified: Secondary | ICD-10-CM

## 2021-02-13 DIAGNOSIS — E1149 Type 2 diabetes mellitus with other diabetic neurological complication: Secondary | ICD-10-CM

## 2021-02-13 DIAGNOSIS — M14679 Charcot's joint, unspecified ankle and foot: Secondary | ICD-10-CM

## 2021-02-13 DIAGNOSIS — M14671 Charcot's joint, right ankle and foot: Secondary | ICD-10-CM | POA: Diagnosis not present

## 2021-02-13 NOTE — Progress Notes (Signed)
The patient presented to the office today to pick up diabetic shoes and 3 pair diabetic custom inserts.  1 pair of inserts were put in the shoes and the shoes were fitted to the patient. The patient states they are comfortable and free of defect. She was satisfied with the fit of the shoe. Instructions for break in and wear were dispensed. The patient signed the delivery documentation and break in instruction form.  If any concerns or questions arise, she is instructed to call the office

## 2021-02-14 ENCOUNTER — Other Ambulatory Visit: Payer: Self-pay | Admitting: Family Medicine

## 2021-02-14 DIAGNOSIS — N289 Disorder of kidney and ureter, unspecified: Secondary | ICD-10-CM

## 2021-02-22 ENCOUNTER — Other Ambulatory Visit: Payer: Medicare PPO

## 2021-02-23 ENCOUNTER — Ambulatory Visit
Admission: RE | Admit: 2021-02-23 | Discharge: 2021-02-23 | Disposition: A | Discharge status: Home / Self Care | Payer: Medicare PPO | Source: Ambulatory Visit | Attending: Family Medicine | Admitting: Family Medicine

## 2021-02-23 DIAGNOSIS — N289 Disorder of kidney and ureter, unspecified: Secondary | ICD-10-CM

## 2021-02-23 DIAGNOSIS — N281 Cyst of kidney, acquired: Secondary | ICD-10-CM | POA: Diagnosis not present

## 2021-02-24 ENCOUNTER — Encounter: Payer: Medicare PPO | Admitting: Internal Medicine

## 2021-03-03 ENCOUNTER — Telehealth: Payer: Self-pay | Admitting: Podiatry

## 2021-03-03 NOTE — Telephone Encounter (Signed)
Pt left message that the shoes she got are to short.  I returned the call and scheduled pt for next week to see one of the assistants to be re measured.

## 2021-03-08 ENCOUNTER — Other Ambulatory Visit: Payer: Medicare PPO

## 2021-03-14 ENCOUNTER — Other Ambulatory Visit: Payer: Medicare PPO

## 2021-03-29 ENCOUNTER — Other Ambulatory Visit: Payer: Medicare PPO

## 2021-03-31 DIAGNOSIS — K130 Diseases of lips: Secondary | ICD-10-CM | POA: Diagnosis not present

## 2021-04-12 ENCOUNTER — Other Ambulatory Visit: Payer: Medicare PPO

## 2021-04-18 DIAGNOSIS — Z79899 Other long term (current) drug therapy: Secondary | ICD-10-CM | POA: Diagnosis not present

## 2021-04-18 DIAGNOSIS — Z5181 Encounter for therapeutic drug level monitoring: Secondary | ICD-10-CM | POA: Diagnosis not present

## 2021-04-27 DIAGNOSIS — E109 Type 1 diabetes mellitus without complications: Secondary | ICD-10-CM | POA: Diagnosis not present

## 2021-04-28 ENCOUNTER — Encounter: Payer: Medicare PPO | Admitting: Internal Medicine

## 2021-04-28 DIAGNOSIS — Z959 Presence of cardiac and vascular implant and graft, unspecified: Secondary | ICD-10-CM

## 2021-04-28 DIAGNOSIS — I495 Sick sinus syndrome: Secondary | ICD-10-CM

## 2021-05-03 DIAGNOSIS — E109 Type 1 diabetes mellitus without complications: Secondary | ICD-10-CM | POA: Diagnosis not present

## 2021-05-04 NOTE — Progress Notes (Signed)
Electrophysiology Office Note Date: 05/05/2021  ID:  [name], DOB 1941-08-23, MRN [id number]  PCP: Caren Macadam, MD Primary Cardiologist: Fransico Him, MD Electrophysiologist: Virl Axe, MD   CC: Pacemaker follow-up  [name] is a 80 y.o. female seen today for Virl Axe, MD for routine electrophysiology followup.  Since last being seen in our clinic the patient reports doing well overall.  she denies chest pain, palpitations, dyspnea, PND, orthopnea, nausea, vomiting, dizziness, syncope, edema, weight gain, or early satiety.  Device History: Medtronic Dual Chamber PPM implanted 07/2015 for SND  Past Medical History:  Diagnosis Date   Anxiety    Arthritis    Cancer ([place])    colon   CHF (congestive heart failure) (HCC)    Chronic kidney disease    stage 3   Chronic pain syndrome    Diabetes (HCC)    Diabetic neuropathy (HCC)    Dyslipidemia    Early cataracts, bilateral    Fibromyalgia    GERD (gastroesophageal reflux disease)    H/O syncope    Heart murmur    Hypertension    Lumbar spinal stenosis    and scoliosis   Pneumonia    PONV (postoperative nausea and vomiting)    Presence of permanent cardiac pacemaker    Restless legs    Spinal headache    Thyroid nodule    Past Surgical History:  Procedure Laterality Date   APPENDECTOMY     BREAST SURGERY     lumpectomy   COLON SURGERY     DILATION AND CURETTAGE OF UTERUS     EP IMPLANTABLE DEVICE N/A 07/21/2015   Procedure: Pacemaker Implant;  Surgeon: Deboraha Sprang, MD;  Location: [place] [facility];  Service: Cardiovascular;  Laterality: N/A;   LUMBAR LAMINECTOMY/DECOMPRESSION MICRODISCECTOMY N/A 02/02/2016   Procedure: DECOMPRESSION L4-L5 WITH INSITE 2 FUSION ;  Surgeon: Melina Schools, MD;  Location: [place];  Service: Orthopedics;  Laterality: N/A;   PARTIAL NEPHRECTOMY     RIGHT HEART CATH N/A 11/02/2020   Procedure: RIGHT HEART CATH;  Surgeon: Jolaine Artist, MD;  Location: [place]  [facility];  Service: Cardiovascular;  Laterality: N/A;   RIGHT/LEFT HEART CATH AND CORONARY ANGIOGRAPHY N/A 05/19/2018   Procedure: RIGHT/LEFT HEART CATH AND CORONARY ANGIOGRAPHY;  Surgeon: Jolaine Artist, MD;  Location: [place] [facility];  Service: Cardiovascular;  Laterality: N/A;   TIBIA IM NAIL INSERTION Right 05/19/2020   Procedure: INTRAMEDULLARY (IM) NAIL TIBIAL;  Surgeon: Nicholes Stairs, MD;  Location: [place];  Service: Orthopedics;  Laterality: Right;   TONSILLECTOMY     TUBAL LIGATION      Current Outpatient Medications  Medication Sig Dispense Refill   ACCU-CHEK AVIVA PLUS test strip      Accu-Chek Softclix Lancets lancets      acetaminophen (TYLENOL) 500 MG tablet Take 2 tablets (1,000 mg total) by mouth 3 (three) times daily. 30 tablet 0   B-D UF III MINI PEN NEEDLES 31G X 5 MM MISC      Blood Glucose Monitoring Suppl (ACCU-CHEK AVIVA PLUS) w/Device KIT      calcium carbonate (OS-CAL) 600 MG TABS tablet Take 600 mg by mouth daily with breakfast.     Cholecalciferol (VITAMIN D) 2000 UNITS tablet Take 2,000 Units by mouth daily.     Cromolyn Sodium (NASAL ALLERGY NA) Place 1 spray into the nose at bedtime as needed (sleep).     cyclobenzaprine (FLEXERIL) 10 MG tablet Take 10 mg by  mouth 3 (three) times daily as needed for muscle spasms.     diclofenac Sodium (VOLTAREN) 1 % GEL Apply 1 application topically at bedtime as needed (sleep).     diphenhydrAMINE (BENADRYL) 25 MG tablet Take 25 mg by mouth daily as needed for itching or allergies.     doxylamine, Sleep, (UNISOM) 25 MG tablet Take 25 mg by mouth at bedtime.     DULoxetine (CYMBALTA) 20 MG capsule Take 20 mg by mouth at bedtime.      empagliflozin (JARDIANCE) 10 MG TABS tablet Take 1 tablet (10 mg total) by mouth daily before breakfast. (Patient taking differently: Take 10 mg by mouth daily.) 90 tablet 3   gabapentin (NEURONTIN) 300 MG capsule Take 300 mg by mouth at bedtime.      hydrALAZINE (APRESOLINE) 100 MG  tablet Take 1 tablet (100 mg total) by mouth 3 (three) times daily. 90 tablet 6   HYDROcodone-acetaminophen (NORCO) 10-325 MG tablet Take 0.5-1 tablets by mouth 4 (four) times daily as needed for pain.     insulin degludec (TRESIBA FLEXTOUCH) 200 UNIT/ML FlexTouch Pen Inject 90 Units into the skin daily.     irbesartan (AVAPRO) 300 MG tablet Take 300 mg by mouth daily.     labetalol (NORMODYNE) 200 MG tablet TAKE 1 TABLET(200 MG) BY MOUTH TWICE DAILY 180 tablet 3   magnesium oxide (MAG-OX) 400 MG tablet Take 400 mg by mouth daily.     omeprazole (PRILOSEC OTC) 20 MG tablet Take 20 mg by mouth daily as needed (acid reflux).     pramipexole (MIRAPEX) 0.125 MG tablet Take 0.25 mg by mouth 2 (two) times daily.     rosuvastatin (CRESTOR) 10 MG tablet Take 10 mg by mouth daily.      torsemide (DEMADEX) 20 MG tablet TAKE 2 TABLETS BY MOUTH DAILY.  TAKE EXTRA TABLET  AT 2:00PM IF WEIGHT IS 216 OR HIGHER 225 tablet 0   traMADol (ULTRAM) 50 MG tablet Take 2 tablets by mouth daily as needed.     No current facility-administered medications for this visit.    Allergies:   Penicillins, Codeine, Adhesive [tape], Aspirin, Crab (diagnostic), Iodine, Ivp dye [iodinated diagnostic agents], and Sulfa antibiotics   Social History: Social History   Socioeconomic History   Marital status: Widowed    Spouse name: Not on file   Number of children: Not on file   Years of education: Not on file   Highest education level: Not on file  Occupational History   Not on file  Tobacco Use   Smoking status: Former    Types: Cigarettes    Start date: 09/10/1953    Quit date: 09/11/1983    Years since quitting: 37.6   Smokeless tobacco: Never  Vaping Use   Vaping Use: Never used  Substance and Sexual Activity   Alcohol use: Yes    Alcohol/week: 0.0 standard drinks    Comment: rare glass of wine   Drug use: No   Sexual activity: Not on file  Other Topics Concern   Not on file  Social History Narrative   Not on  file   Social Determinants of Health   Financial Resource Strain: Not on file  Food Insecurity: Not on file  Transportation Needs: Not on file  Physical Activity: Not on file  Stress: Not on file  Social Connections: Not on file  Intimate Partner Violence: Not on file    Family History: Family History  Problem Relation Age of Onset  CAD Other    Sick sinus syndrome Brother        has a PPM     Review of Systems: All other systems reviewed and are otherwise negative except as noted above.  Physical Exam: Vitals:   05/05/21 1137  Pulse: 74  SpO2: 94%  Weight: 218 lb (98.9 kg)  Height: 5' 3"  (1.6 m)     GEN- The patient is well appearing, alert and oriented x 3 today.   HEENT: normocephalic, atraumatic; sclera clear, conjunctiva pink; hearing intact; oropharynx clear; neck supple  Lungs- Clear to ausculation bilaterally, normal work of breathing.  No wheezes, rales, rhonchi Heart- Regular rate and rhythm, no murmurs, rubs or gallops  GI- soft, non-tender, non-distended, bowel sounds present  Extremities- no clubbing or cyanosis. No edema MS- no significant deformity or atrophy Skin- warm and dry, no rash or lesion; PPM pocket well healed Psych- euthymic mood, full affect Neuro- strength and sensation are intact  PPM Interrogation- reviewed in detail today,  See PACEART report  EKG:  EKG is ordered today. Personal review of ekg ordered today shows AP VS at 74 bpm   Recent Labs: 05/20/2020: Magnesium 2.1 10/26/2020: BUN 57; Creatinine, Ser 0.76; Platelets 162 11/02/2020: Hemoglobin 10.2; Potassium 3.0; Sodium 147   Wt Readings from Last 3 Encounters:  05/05/21 218 lb (98.9 kg)  11/02/20 224 lb (101.6 kg)  10/26/20 224 lb (101.6 kg)     Other studies Reviewed: Additional studies/ records that were reviewed today include: Previous EP office notes, Previous remote checks, Most recent labwork.   Assessment and Plan:  1. SND s/p Medtronic PPM  Normal PPM  function See Pace Art report No changes today  2. Chronic diastolic CHF Echo 10/8977 LVEF 60-65%   Current medicines are reviewed at length with the patient today.    Labs/ tests ordered today include:  Orders Placed This Encounter  Procedures   CBC   Basic metabolic panel   EKG 15-WCHJ   Disposition:   Follow up with Dr. Caryl Comes in 12 Months   Signed, Annamaria Helling  05/05/2021 11:52 AM  [facility] [address] [place]  [place] [id number] [phone] (office) [phone] (fax)

## 2021-05-05 ENCOUNTER — Other Ambulatory Visit: Payer: Self-pay | Admitting: Student

## 2021-05-05 ENCOUNTER — Other Ambulatory Visit: Payer: Self-pay

## 2021-05-05 ENCOUNTER — Encounter: Payer: Self-pay | Admitting: Student

## 2021-05-05 ENCOUNTER — Ambulatory Visit (INDEPENDENT_AMBULATORY_CARE_PROVIDER_SITE_OTHER): Payer: Medicare PPO | Admitting: Student

## 2021-05-05 VITALS — BP 100/40 | HR 74 | Ht 63.0 in | Wt 218.0 lb

## 2021-05-05 DIAGNOSIS — I10 Essential (primary) hypertension: Secondary | ICD-10-CM

## 2021-05-05 DIAGNOSIS — I495 Sick sinus syndrome: Secondary | ICD-10-CM

## 2021-05-05 DIAGNOSIS — I5022 Chronic systolic (congestive) heart failure: Secondary | ICD-10-CM

## 2021-05-05 DIAGNOSIS — G4733 Obstructive sleep apnea (adult) (pediatric): Secondary | ICD-10-CM

## 2021-05-05 LAB — BASIC METABOLIC PANEL
BUN/Creatinine Ratio: 28 (ref 12–28)
BUN: 75 mg/dL — ABNORMAL HIGH (ref 8–27)
CO2: 22 mmol/L (ref 20–29)
Calcium: 9 mg/dL (ref 8.7–10.3)
Chloride: 97 mmol/L (ref 96–106)
Creatinine, Ser: 2.71 mg/dL — ABNORMAL HIGH (ref 0.57–1.00)
Glucose: 172 mg/dL — ABNORMAL HIGH (ref 65–99)
Potassium: 4.8 mmol/L (ref 3.5–5.2)
Sodium: 138 mmol/L (ref 134–144)
eGFR: 17 mL/min/{1.73_m2} — ABNORMAL LOW (ref >59)

## 2021-05-05 LAB — CUP PACEART INCLINIC DEVICE CHECK
Battery Remaining Longevity: 60 mo
Battery Voltage: 2.99 V
Brady Statistic AP VP Percent: 0.05 %
Brady Statistic AP VS Percent: 99.83 %
Brady Statistic AS VP Percent: 0 %
Brady Statistic AS VS Percent: 0.12 %
Brady Statistic RA Percent Paced: 99.84 %
Brady Statistic RV Percent Paced: 0.05 %
Date Time Interrogation Session: 20220826122850
Implantable Lead Implant Date: 20161110
Implantable Lead Implant Date: 20161110
Implantable Lead Location: 753859
Implantable Lead Location: 753860
Implantable Lead Model: 5076
Implantable Lead Model: 5076
Implantable Pulse Generator Implant Date: 20161110
Lead Channel Impedance Value: 361 Ohm
Lead Channel Impedance Value: 361 Ohm
Lead Channel Impedance Value: 418 Ohm
Lead Channel Impedance Value: 437 Ohm
Lead Channel Pacing Threshold Amplitude: 0.625 V
Lead Channel Pacing Threshold Amplitude: 1.5 V
Lead Channel Pacing Threshold Pulse Width: 0.4 ms
Lead Channel Pacing Threshold Pulse Width: 0.4 ms
Lead Channel Sensing Intrinsic Amplitude: 3.625 mV
Lead Channel Sensing Intrinsic Amplitude: 3.625 mV
Lead Channel Sensing Intrinsic Amplitude: 5 mV
Lead Channel Sensing Intrinsic Amplitude: 5.75 mV
Lead Channel Setting Pacing Amplitude: 1.5 V
Lead Channel Setting Pacing Amplitude: 3 V
Lead Channel Setting Pacing Pulse Width: 0.4 ms
Lead Channel Setting Sensing Sensitivity: 2.8 mV

## 2021-05-05 LAB — CBC
Hematocrit: 37.8 % (ref 34.0–46.6)
Hemoglobin: 12.5 g/dL (ref 11.1–15.9)
MCH: 29.8 pg (ref 26.6–33.0)
MCHC: 33.1 g/dL (ref 31.5–35.7)
MCV: 90 fL (ref 79–97)
Platelets: 154 10*3/uL (ref 150–450)
RBC: 4.2 x10E6/uL (ref 3.77–5.28)
RDW: 13.4 % (ref 11.7–15.4)
WBC: 5.6 10*3/uL (ref 3.4–10.8)

## 2021-05-05 NOTE — Patient Instructions (Signed)
Medication Instructions:  Your physician recommends that you continue on your current medications as directed. Please refer to the Current Medication list given to you today.  *If you need a refill on your cardiac medications before your next appointment, please call your pharmacy*   Lab Work: TODAY: BMET, CBC  If you have labs (blood work) drawn today and your tests are completely normal, you will receive your results only by: [place] (if you have MyChart) OR A paper copy in the mail If you have any lab test that is abnormal or we need to change your treatment, we will call you to review the results.    Follow-Up: At [facility], you and your health needs are our priority.  As part of our continuing mission to provide you with exceptional heart care, we have created designated Provider Care Teams.  These Care Teams include your primary Cardiologist (physician) and Advanced Practice Providers (APPs -  Physician Assistants and Nurse Practitioners) who all work together to provide you with the care you need, when you need it.  We recommend signing up for the patient portal called "MyChart".  Sign up information is provided on this After Visit Summary.  MyChart is used to connect with patients for Virtual Visits (Telemedicine).  Patients are able to view lab/test results, encounter notes, upcoming appointments, etc.  Non-urgent messages can be sent to your provider as well.   To learn more about what you can do with MyChart, go to [email or web address].    Your next appointment:   1 year(s)  The format for your next appointment:   In Person  Provider:   You may see Virl Axe, MD or one of the following Advanced Practice Providers on your designated Care Team:   [name], [place] "[facility]" [place], [place]

## 2021-05-08 ENCOUNTER — Other Ambulatory Visit: Payer: Self-pay

## 2021-05-08 ENCOUNTER — Other Ambulatory Visit: Payer: Medicare PPO | Admitting: *Deleted

## 2021-05-08 ENCOUNTER — Other Ambulatory Visit: Payer: Self-pay | Admitting: *Deleted

## 2021-05-08 ENCOUNTER — Telehealth: Payer: Self-pay | Admitting: Student

## 2021-05-08 DIAGNOSIS — I5022 Chronic systolic (congestive) heart failure: Secondary | ICD-10-CM

## 2021-05-08 MED ORDER — TORSEMIDE 20 MG PO TABS: ORAL_TABLET | ORAL | 3 refills | Status: DC

## 2021-05-08 NOTE — Telephone Encounter (Signed)
Attempted to call the pt back to endorse recommendations per Oda Kilts PA-C, and pt did not answer and mailbox was not working at this time.

## 2021-05-08 NOTE — Telephone Encounter (Signed)
Shirley Friar, PA-C  Sent: Mon May 08, 2021  8:49 AM  To: Carylon Perches, CMA          Message    Please have her follow up with the prescriber of that medication for recommendations on alternate medications. It's possible it could have been contributing, and also possible for it to cause further damage while her kidney function is elevated.     She should hold Celebrex and seek an alternate medication via whomever manages her arthritis. Typically we recommend tylenol, which may not be as effective but is overall safer as long as she does not have liver issues.      Legrand Como [address] [place], PA-C   05/08/2021 8:50 AM

## 2021-05-08 NOTE — Telephone Encounter (Signed)
Pt c/o medication issue:  1. Name of Medication: Celebrex 200 mg 2. How are you currently taking this medication (dosage and times per day)? As directed  3. Are you having a reaction (difficulty breathing--STAT)? no  4. What is your medication issue?  Pt had labwork on 05/05/21 and pt was advised that her kidney function was too low. Pt feels like its low because one of her meds was not on her meds list Clelebrex 200mg  1 x per day

## 2021-05-09 ENCOUNTER — Telehealth: Payer: Self-pay | Admitting: Student

## 2021-05-09 DIAGNOSIS — Z79899 Other long term (current) drug therapy: Secondary | ICD-10-CM

## 2021-05-09 DIAGNOSIS — N1832 Chronic kidney disease, stage 3b: Secondary | ICD-10-CM

## 2021-05-09 DIAGNOSIS — I5022 Chronic systolic (congestive) heart failure: Secondary | ICD-10-CM

## 2021-05-09 LAB — BASIC METABOLIC PANEL
BUN/Creatinine Ratio: 29 — ABNORMAL HIGH (ref 12–28)
BUN: 46 mg/dL — ABNORMAL HIGH (ref 8–27)
CO2: 24 mmol/L (ref 20–29)
Calcium: 9.3 mg/dL (ref 8.7–10.3)
Chloride: 102 mmol/L (ref 96–106)
Creatinine, Ser: 1.61 mg/dL — ABNORMAL HIGH (ref 0.57–1.00)
Glucose: 64 mg/dL — ABNORMAL LOW (ref 65–99)
Potassium: 5 mmol/L (ref 3.5–5.2)
Sodium: 141 mmol/L (ref 134–144)
eGFR: 32 mL/min/{1.73_m2} — ABNORMAL LOW (ref >59)

## 2021-05-09 LAB — PRO B NATRIURETIC PEPTIDE: NT-Pro BNP: 596 pg/mL (ref 0–738)

## 2021-05-09 MED ORDER — TORSEMIDE 20 MG PO TABS: ORAL_TABLET | ORAL | 1 refills | Status: DC

## 2021-05-09 NOTE — Telephone Encounter (Signed)
Shirley Friar, PA-C  05/09/2021  7:28 AM EDT Back to Top    Creatinine trending down.  Please have her take torsemide 20 mg daily alternating with 40 mg daily for now. Needs another BMET in 1 week.   Will forward to HF team as well    The patient has been notified of the result and verbalized understanding.  All questions (if any) were answered.  Pt will come in for repeat BMET on 05/16/21.  Confirmed the pharmacy of choice with the pt.  Phoned in torsemide dose change to the pharmacist at [facility].  He advised to write out take torsemide 20 mg po EOD, take 40 mg po on alternating days.   Pt verbalized understanding and agrees with this plan.

## 2021-05-09 NOTE — Telephone Encounter (Signed)
Patient is returning phone call to [facility]. Its the medication that Dr. Harrell Gave and nurse had take off whether when should resume it or not.

## 2021-05-09 NOTE — Telephone Encounter (Signed)
Pt made aware of recommendations per Oda Kilts PA-C.  Pt states she will contact the prescribing MD of her Celebrex about this, and if alternating medicine needs to be advised in it's place.  Pt states she is already taking tramadol and tylenol as needed for pain.  Pt verbalized understanding and agrees with this plan.

## 2021-05-16 ENCOUNTER — Other Ambulatory Visit: Payer: Medicare PPO | Admitting: *Deleted

## 2021-05-16 ENCOUNTER — Other Ambulatory Visit: Payer: Self-pay

## 2021-05-16 DIAGNOSIS — I5022 Chronic systolic (congestive) heart failure: Secondary | ICD-10-CM

## 2021-05-16 DIAGNOSIS — N1832 Chronic kidney disease, stage 3b: Secondary | ICD-10-CM

## 2021-05-16 DIAGNOSIS — Z79899 Other long term (current) drug therapy: Secondary | ICD-10-CM | POA: Diagnosis not present

## 2021-05-16 LAB — BASIC METABOLIC PANEL
BUN/Creatinine Ratio: 22 (ref 12–28)
BUN: 40 mg/dL — ABNORMAL HIGH (ref 8–27)
CO2: 22 mmol/L (ref 20–29)
Calcium: 9.1 mg/dL (ref 8.7–10.3)
Chloride: 100 mmol/L (ref 96–106)
Creatinine, Ser: 1.82 mg/dL — ABNORMAL HIGH (ref 0.57–1.00)
Glucose: 278 mg/dL — ABNORMAL HIGH (ref 65–99)
Potassium: 4.4 mmol/L (ref 3.5–5.2)
Sodium: 141 mmol/L (ref 134–144)
eGFR: 27 mL/min/{1.73_m2} — ABNORMAL LOW (ref >59)

## 2021-05-18 DIAGNOSIS — L249 Irritant contact dermatitis, unspecified cause: Secondary | ICD-10-CM | POA: Diagnosis not present

## 2021-05-23 ENCOUNTER — Telehealth: Payer: Self-pay | Admitting: Student

## 2021-05-23 DIAGNOSIS — E109 Type 1 diabetes mellitus without complications: Secondary | ICD-10-CM | POA: Diagnosis not present

## 2021-05-23 NOTE — Telephone Encounter (Signed)
Shirley Friar, PA-C  05/23/2021  8:53 AM EDT Back to Top    Her Cr appears to have stabilized but remains quite higher than 6 months prior.    Needs HF follow up with adjustment of torsemide   Tried to call patient back. No answer.

## 2021-05-23 NOTE — Telephone Encounter (Signed)
Follow Up:    Patient would like her lab results from last week please.

## 2021-05-24 ENCOUNTER — Other Ambulatory Visit: Payer: Self-pay

## 2021-05-24 ENCOUNTER — Ambulatory Visit (INDEPENDENT_AMBULATORY_CARE_PROVIDER_SITE_OTHER): Payer: Medicare PPO | Admitting: *Deleted

## 2021-05-24 DIAGNOSIS — E1140 Type 2 diabetes mellitus with diabetic neuropathy, unspecified: Secondary | ICD-10-CM

## 2021-05-24 DIAGNOSIS — M14679 Charcot's joint, unspecified ankle and foot: Secondary | ICD-10-CM

## 2021-05-24 DIAGNOSIS — E1149 Type 2 diabetes mellitus with other diabetic neurological complication: Secondary | ICD-10-CM

## 2021-05-24 NOTE — Progress Notes (Signed)
Patient presents today for a problem with her diabetic shoes.   Patient states the shoe feels too tight and rubbing the tips of her toes. She has severe PTTD. Her ankle tends to rub any shoe that cuts too low. She is satisfied with the style of shoe, but hoping getting a larger size with help with the rubbing at the toes.    I recommended sending the shoes back and getting a slightly larger size. I did measure her on the men's brannock device and she does measure a 7.5, which is the size she had.   We will send back: Apex 1209 (unisex shoe) size Men's 7.5/8  Reorder: Apex 1209 (unisex shoe) Men's 8.5/9  Insoles were kept in the office in a shoe box to check fit with reordered shoes.  Patient will be contacted for a fitting appointment once the reordered shoes arrive in office.

## 2021-05-26 DIAGNOSIS — Z23 Encounter for immunization: Secondary | ICD-10-CM | POA: Diagnosis not present

## 2021-05-26 DIAGNOSIS — G4700 Insomnia, unspecified: Secondary | ICD-10-CM | POA: Diagnosis not present

## 2021-05-26 DIAGNOSIS — I10 Essential (primary) hypertension: Secondary | ICD-10-CM | POA: Diagnosis not present

## 2021-05-26 DIAGNOSIS — E118 Type 2 diabetes mellitus with unspecified complications: Secondary | ICD-10-CM | POA: Diagnosis not present

## 2021-05-26 DIAGNOSIS — N1832 Chronic kidney disease, stage 3b: Secondary | ICD-10-CM | POA: Diagnosis not present

## 2021-05-26 DIAGNOSIS — R232 Flushing: Secondary | ICD-10-CM | POA: Diagnosis not present

## 2021-05-29 DIAGNOSIS — N184 Chronic kidney disease, stage 4 (severe): Secondary | ICD-10-CM | POA: Diagnosis not present

## 2021-06-06 NOTE — Progress Notes (Signed)
[facility] Note   Date:  06/07/2021   ID:  [name], DOB [birth year], MRN [id number]  Location: Home  Provider location: [facility] Type of Visit: Established patient  PCP:  Caren Macadam, MD  Cardiologist:  Fransico Him, MD Primary HF: [place]  Chief Complaint: Heart Failure follow-up   History of Present Illness:  [name] is a 80 y.o. female with history of diastolic heart failure, DM, GERD, colon cancer, CKD stage III, 2016 pacemaker (MDT) due to symptomatic sinus node dysfunction.  In 7/19, she had 12 pound weight and she was sent to the ED by her PCP. She was sent home on metolazone every other day for 14 days. She took 4 doses and her creatinine went up to 3 so metolazone was stopped.   R/L cath in 9/19. Normal cors. Elevated filling pressures with pulmonary venous HTN.   Had fall with R tibia fracture in 9/21    Having more dyspnea at follow up 2/22, arranged for RHC.  RHC (2/22) showed mild to moderate elevated filling pressures with prominent v waves in PCWP tracing, mild pulmonary HTN and high CO with no evidence of intracardiac shunting. Echo (4/22) showed EF 60-65%, RV ok, mild AS.  SCr remained elevated on lab check at EP visit.  Today she returns for HF follow up after recent labs showed worsening kidney function. She was instructed to hold her irbesartan x 2 days then resume, however she remains off of this. Torsemide dose slightly decreased as well. She remains SOB with any activity. Legs are swelling and has some dizziness. Due for cataract surgery soon. Denies CP. Chronically sleeps in recliner.  Appetite ok.  Weight at home 209 pounds, but does not weigh regularly. Taking all medications.  Was told that her Nephrologist reviewed her recent labs and did not need to see her sooner than next appt scheduled for 01/2022.  Cardiac Studies: - Echo (4/22): EF 60-65%, RV ok, mild AS mean gradient 21.0  mmHg  - RHC (2/22):    RA = 9 RV = 49/13 PA = 49/17 (33) PCW = 22 ( v waves 28) Fick cardiac output/index = 8.0/3.9 PVR = 1.6 WU FA sat = 95% PA sat = 74%, 73% High SVC sat = 69%  Assessment: 1. Mild to moderately elevated filling pressures with prominent v waves in PCWP tracing suggestive of diastolic dysfunction versus mitral regurgitation 2. Mild pulmonary venous HTN 3. High cardiac output with no evidence of intracardiac shunting  - PFTs 05/23/18: FEV1: 1.72 FEV1/FVC ratio: 77% DLCO: 70%  - R/LHC 05/19/18: Ao = 192/74 (118) LV = 201/26 RA = 14 RV = 53/18 PA = 53/19 (38) PCW = 27 (v = 41) Fick cardiac output/index = 5.6/2.8 PVR = 2.0 WU Ao sat = 95% PA sat = 64%, 66% Assessment: 1. Normal coronaries with ectactic vessels suggestive of longstanding HTN 2. Severe HTN 3. Normal LV function 4. Signficantly elevated filling pressures with pulmonary venous HTN in setting of holding diuretics for 2 days Plan/Discussion: Filling pressures and BP elevated. Will resume diuretics. Will need aggressive titration of ant-HTN regimen.  - Echo 05/14/18: - Left ventricle: Posterior basal and inferolateral hypokinesis The   cavity size was moderately dilated. Wall thickness was increased   in a pattern of mild LVH. Systolic function was normal. The   estimated ejection fraction was in the range of 50% to 55%. Left   ventricular diastolic function parameters were normal. -  Aortic valve: Sclerosis without stenosis. - Mitral valve: Moderately calcified annulus. Moderately thickened,   mildly calcified leaflets . - Left atrium: The atrium was mildly dilated. - Atrial septum: No defect or patent foramen ovale was identified.   -Sleep study 10/19: AHI 49  - 06/21/2015 Myoview  Nuclear stress EF: 64%. There was no ST segment deviation noted during stress. The study is normal. This is a low risk study. No ischemia identified.  SH: Former 30-40 pack year smoker, quit 25 years ago.    Past Medical History:  Diagnosis Date   Anxiety    Arthritis    Cancer ([place])    colon   CHF (congestive heart failure) (HCC)    Chronic kidney disease    stage 3   Chronic pain syndrome    Diabetes (HCC)    Diabetic neuropathy (HCC)    Dyslipidemia    Early cataracts, bilateral    Fibromyalgia    GERD (gastroesophageal reflux disease)    H/O syncope    Heart murmur    Hypertension    Lumbar spinal stenosis    and scoliosis   Pneumonia    PONV (postoperative nausea and vomiting)    Presence of permanent cardiac pacemaker    Restless legs    Spinal headache    Thyroid nodule    Past Surgical History:  Procedure Laterality Date   APPENDECTOMY     BREAST SURGERY     lumpectomy   COLON SURGERY     DILATION AND CURETTAGE OF UTERUS     EP IMPLANTABLE DEVICE N/A 07/21/2015   Procedure: Pacemaker Implant;  Surgeon: Deboraha Sprang, MD;  Location: [facility];  Service: Cardiovascular;  Laterality: N/A;   LUMBAR LAMINECTOMY/DECOMPRESSION MICRODISCECTOMY N/A 02/02/2016   Procedure: DECOMPRESSION L4-L5 WITH INSITE 2 FUSION ;  Surgeon: Melina Schools, MD;  Location: [facility];  Service: Orthopedics;  Laterality: N/A;   PARTIAL NEPHRECTOMY     RIGHT HEART CATH N/A 11/02/2020   Procedure: RIGHT HEART CATH;  Surgeon: Jolaine Artist, MD;  Location: [facility];  Service: Cardiovascular;  Laterality: N/A;   RIGHT/LEFT HEART CATH AND CORONARY ANGIOGRAPHY N/A 05/19/2018   Procedure: RIGHT/LEFT HEART CATH AND CORONARY ANGIOGRAPHY;  Surgeon: Jolaine Artist, MD;  Location: [facility];  Service: Cardiovascular;  Laterality: N/A;   TIBIA IM NAIL INSERTION Right 05/19/2020   Procedure: INTRAMEDULLARY (IM) NAIL TIBIAL;  Surgeon: Nicholes Stairs, MD;  Location: [facility];  Service: Orthopedics;  Laterality: Right;   TONSILLECTOMY     TUBAL LIGATION     Current Outpatient Medications  Medication Sig Dispense Refill   ACCU-CHEK AVIVA PLUS test strip      Accu-Chek  Softclix Lancets lancets      acetaminophen (TYLENOL) 500 MG tablet Take 2 tablets (1,000 mg total) by mouth 3 (three) times daily. 30 tablet 0   B-D UF III MINI PEN NEEDLES 31G X 5 MM MISC      Blood Glucose Monitoring Suppl (ACCU-CHEK AVIVA PLUS) w/Device KIT      calcium carbonate (OS-CAL) 600 MG TABS tablet Take 600 mg by mouth daily with breakfast.     Cholecalciferol (VITAMIN D) 2000 UNITS tablet Take 2,000 Units by mouth daily.     Cromolyn Sodium (NASAL ALLERGY NA) Place 1 spray into the nose at bedtime as needed (sleep).     cyclobenzaprine (FLEXERIL) 10 MG tablet Take 10 mg by mouth 3 (three) times daily as needed for muscle  spasms.     diclofenac Sodium (VOLTAREN) 1 % GEL Apply 1 application topically at bedtime as needed (sleep).     diphenhydrAMINE (BENADRYL) 25 MG tablet Take 25 mg by mouth daily as needed for itching or allergies.     DULoxetine (CYMBALTA) 20 MG capsule Take 20 mg by mouth daily.     empagliflozin (JARDIANCE) 10 MG TABS tablet Take 1 tablet (10 mg total) by mouth daily before breakfast. (Patient taking differently: Take 10 mg by mouth daily.) 90 tablet 3   gabapentin (NEURONTIN) 300 MG capsule Take 300 mg by mouth at bedtime.      hydrALAZINE (APRESOLINE) 100 MG tablet Take 1 tablet (100 mg total) by mouth 3 (three) times daily. 90 tablet 6   insulin degludec (TRESIBA FLEXTOUCH) 200 UNIT/ML FlexTouch Pen Inject 70 Units into the skin daily.     labetalol (NORMODYNE) 200 MG tablet TAKE 1 TABLET(200 MG) BY MOUTH TWICE DAILY 180 tablet 3   omeprazole (PRILOSEC OTC) 20 MG tablet Take 20 mg by mouth daily as needed (acid reflux).     pramipexole (MIRAPEX) 0.125 MG tablet Take 0.25 mg by mouth 2 (two) times daily.     rosuvastatin (CRESTOR) 10 MG tablet Take 10 mg by mouth daily.      torsemide (DEMADEX) 20 MG tablet Take 1 tablet (20 mg total) by mouth every other day, take 2 tablets (40 mg total) by mouth on alternating days. 45 tablet 1   traMADol (ULTRAM) 50 MG  tablet Take 2 tablets by mouth daily as needed.     No current facility-administered medications for this encounter.   Allergies:   Penicillins, Codeine, Adhesive [tape], Aspirin, Crab (diagnostic), Iodine, Ivp dye [iodinated diagnostic agents], and Sulfa antibiotics   Social History:  The patient  reports that she quit smoking about 37 years ago. Her smoking use included cigarettes. She started smoking about 67 years ago. She has never used smokeless tobacco. She reports current alcohol use. She reports that she does not use drugs.   Family History:  The patient's family history includes CAD in an other family member; Sick sinus syndrome in her brother.   ROS:  Please see the history of present illness.   All other systems are personally reviewed and negative.   Recent Labs: 05/05/2021: Hemoglobin 12.5; Platelets 154 05/08/2021: NT-Pro BNP 596 05/16/2021: BUN 40; Creatinine, Ser 1.82; Potassium 4.4; Sodium 141  Personally reviewed   Wt Readings from Last 3 Encounters:  06/07/21 98.3 kg (216 lb 12.8 oz)  05/05/21 98.9 kg (218 lb)  11/02/20 101.6 kg (224 lb)    BP 138/70   Pulse 72   Wt 98.3 kg (216 lb 12.8 oz)   SpO2 93%   BMI 38.40 kg/m   Exam:   General:  NAD. No resp difficulty, arrived in [facility] HEENT: Normal Neck: Supple. Thick neck, JVD difficult. Carotids 2+ bilat; no bruits. No lymphadenopathy or thryomegaly appreciated. Cor: PMI nondisplaced. Regular rate & rhythm. No rubs, gallops, 2/6 AS Lungs: Clear Abdomen: Obese, nontender, nondistended. No hepatosplenomegaly. No bruits or masses. Good bowel sounds. Extremities: No cyanosis, clubbing, rash, 2-3+ BLE edema Neuro: Alert & oriented x 3, cranial nerves grossly intact. Moves all 4 extremities w/o difficulty. Affect pleasant.  Device Interrogation (personally reviewed): Daily activity <1 hr, several small bursts of ?AT/AF.  ASSESSMENT AND PLAN:  1. Chronic Diastolic Heart Failure  - Echo 05/14/18: EF 50-55%, posterior basal  and inferolateral HK, mild LVH, LA mildly dilated - R/LHC 05/19/18:  normal coronaries, elevated filling pressure (had held diuretics x2 days), severe HTN. - Echo (4/22): EF 60-65%, RV ok, mild AS mean gradient 21.0 mmHg - NYHA III-early IIIb,  suspect she is limited more by body habitus and general deconditioning. - Volume looks OK, LE appears more consistent with venous insufficiency, ReDs 31%. Weight down 8 lbs.  - Will place compression hose. She is agreeable to this - Continue torsemide 40 mg daily alternating with 20 mg every other day. - Continue Jardiance 10 mg daily. - Hold off on ARB with elevated SCr. - Hold off on spiro with previous AKI and hyperkalemia - BMET and BNP today.  2. HTN, severe, - Blood pressure well controlled.  - Continue current regimen.  3. OSA  - severe, AHI 49 by PSG in 10/19 - f/u testing AHI 11 - Had televisit with Dr. Radford Pax on 09/30/19. She refused CPAP due to claustrophobia.  - Continues to refuse    4. Dyspnea - LHC 05/19/18 with normal coronaries. - PFTs with DLCO 70%. - Likely mostly due to obesity + OSA - Stressed need again for weight loss with low-carb diet.  - See plan as above.   5. CKD Stage IIIb-IV - Followed by [facility] - SCr 1.8 - Labs today. - On SGLT2i for now   6. Sinus node dysfunction s/p MDT PPM - Follows with Dr Caryl Comes.   7. DM - Continue Jardiance.  8. Severe obesity - Again stressed need for more activity and [place] Body mass index is 38.4 kg/m.   Follow up in 3-4 months with Dr. Melynda Keller, FNP  06/07/2021 3:04 PM  [facility] [address] [facility] [place] [address] [phone] (office) [phone] (fax)

## 2021-06-07 ENCOUNTER — Ambulatory Visit (HOSPITAL_COMMUNITY)
Admission: RE | Admit: 2021-06-07 | Discharge: 2021-06-07 | Disposition: A | Discharge status: Home / Self Care | Payer: Medicare PPO | Source: Ambulatory Visit | Attending: Family Medicine | Admitting: Family Medicine

## 2021-06-07 ENCOUNTER — Encounter (HOSPITAL_COMMUNITY): Payer: Self-pay

## 2021-06-07 ENCOUNTER — Other Ambulatory Visit: Payer: Self-pay

## 2021-06-07 VITALS — BP 138/70 | HR 72 | Wt 216.8 lb

## 2021-06-07 DIAGNOSIS — Z6838 Body mass index (BMI) 38.0-38.9, adult: Secondary | ICD-10-CM | POA: Insufficient documentation

## 2021-06-07 DIAGNOSIS — Z794 Long term (current) use of insulin: Secondary | ICD-10-CM | POA: Diagnosis not present

## 2021-06-07 DIAGNOSIS — Z79899 Other long term (current) drug therapy: Secondary | ICD-10-CM | POA: Diagnosis not present

## 2021-06-07 DIAGNOSIS — I495 Sick sinus syndrome: Secondary | ICD-10-CM | POA: Diagnosis not present

## 2021-06-07 DIAGNOSIS — Z7984 Long term (current) use of oral hypoglycemic drugs: Secondary | ICD-10-CM | POA: Diagnosis not present

## 2021-06-07 DIAGNOSIS — Z09 Encounter for follow-up examination after completed treatment for conditions other than malignant neoplasm: Secondary | ICD-10-CM | POA: Diagnosis not present

## 2021-06-07 DIAGNOSIS — G4733 Obstructive sleep apnea (adult) (pediatric): Secondary | ICD-10-CM | POA: Insufficient documentation

## 2021-06-07 DIAGNOSIS — I5022 Chronic systolic (congestive) heart failure: Secondary | ICD-10-CM | POA: Diagnosis not present

## 2021-06-07 DIAGNOSIS — R42 Dizziness and giddiness: Secondary | ICD-10-CM | POA: Diagnosis not present

## 2021-06-07 DIAGNOSIS — Z87891 Personal history of nicotine dependence: Secondary | ICD-10-CM | POA: Diagnosis not present

## 2021-06-07 DIAGNOSIS — E1122 Type 2 diabetes mellitus with diabetic chronic kidney disease: Secondary | ICD-10-CM | POA: Diagnosis not present

## 2021-06-07 DIAGNOSIS — R0600 Dyspnea, unspecified: Secondary | ICD-10-CM

## 2021-06-07 DIAGNOSIS — Z950 Presence of cardiac pacemaker: Secondary | ICD-10-CM | POA: Insufficient documentation

## 2021-06-07 DIAGNOSIS — E1140 Type 2 diabetes mellitus with diabetic neuropathy, unspecified: Secondary | ICD-10-CM | POA: Insufficient documentation

## 2021-06-07 DIAGNOSIS — I10 Essential (primary) hypertension: Secondary | ICD-10-CM

## 2021-06-07 DIAGNOSIS — Z888 Allergy status to other drugs, medicaments and biological substances status: Secondary | ICD-10-CM | POA: Diagnosis not present

## 2021-06-07 DIAGNOSIS — I5032 Chronic diastolic (congestive) heart failure: Secondary | ICD-10-CM | POA: Insufficient documentation

## 2021-06-07 DIAGNOSIS — R0602 Shortness of breath: Secondary | ICD-10-CM | POA: Insufficient documentation

## 2021-06-07 DIAGNOSIS — N1832 Chronic kidney disease, stage 3b: Secondary | ICD-10-CM | POA: Diagnosis not present

## 2021-06-07 DIAGNOSIS — M7989 Other specified soft tissue disorders: Secondary | ICD-10-CM | POA: Diagnosis not present

## 2021-06-07 DIAGNOSIS — I130 Hypertensive heart and chronic kidney disease with heart failure and stage 1 through stage 4 chronic kidney disease, or unspecified chronic kidney disease: Secondary | ICD-10-CM | POA: Insufficient documentation

## 2021-06-07 DIAGNOSIS — E1136 Type 2 diabetes mellitus with diabetic cataract: Secondary | ICD-10-CM | POA: Insufficient documentation

## 2021-06-07 DIAGNOSIS — Z8249 Family history of ischemic heart disease and other diseases of the circulatory system: Secondary | ICD-10-CM | POA: Insufficient documentation

## 2021-06-07 LAB — BASIC METABOLIC PANEL
Anion gap: 9 (ref 5–15)
BUN: 51 mg/dL — ABNORMAL HIGH (ref 8–23)
CO2: 30 mmol/L (ref 22–32)
Calcium: 9.7 mg/dL (ref 8.9–10.3)
Chloride: 97 mmol/L — ABNORMAL LOW (ref 98–111)
Creatinine, Ser: 1.9 mg/dL — ABNORMAL HIGH (ref 0.44–1.00)
GFR, Estimated: 26 mL/min — ABNORMAL LOW (ref >60)
Glucose, Bld: 103 mg/dL — ABNORMAL HIGH (ref 70–99)
Potassium: 3.5 mmol/L (ref 3.5–5.1)
Sodium: 136 mmol/L (ref 135–145)

## 2021-06-07 LAB — BRAIN NATRIURETIC PEPTIDE: B Natriuretic Peptide: 26.2 pg/mL (ref 0.0–100.0)

## 2021-06-07 NOTE — Patient Instructions (Signed)
It was great to see you today! No medication changes are needed at this time.   Labs today We will only contact you if something comes back abnormal or we need to make some changes. Otherwise no news is good news!   Please wear your compression hose daily, place them on as soon as you get up in the morning and remove before you go to bed at night.   Your physician recommends that you schedule a follow-up appointment in: 4 weeks  in the [facility]NP[facility] and in 3-4 months with Dr Haroldine Laws  Do the following things EVERYDAY: Weigh yourself in the morning before breakfast. Write it down and keep it in a log. Take your medicines as prescribed Eat low salt foods--Limit salt (sodium) to 2000 mg per day.  Stay as active as you can everyday Limit all fluids for the day to less than 2 liters  At the [facility], you and your health needs are our priority. As part of our continuing mission to provide you with exceptional heart care, we have created designated Provider Care Teams. These Care Teams include your primary Cardiologist (physician) and Advanced Practice Providers (APPs- Physician Assistants and Nurse Practitioners) who all work together to provide you with the care you need, when you need it.   You may see any of the following providers on your designated Care Team at your next follow up: Dr Glori Bickers Dr Loralie Champagne Dr Patrice Paradise, NP Lyda Jester, [place] Ginnie Smart Audry Riles, PharmD   Please be sure to bring in all your medications bottles to every appointment.

## 2021-06-07 NOTE — Progress Notes (Signed)
ReDS Vest / Clip - 06/07/21 1500       ReDS Vest / Clip   Station Marker B    Ruler Value 40    ReDS Value Range Low volume    ReDS Actual Value 31

## 2021-06-08 ENCOUNTER — Telehealth (HOSPITAL_COMMUNITY): Payer: Self-pay

## 2021-06-08 MED ORDER — POTASSIUM CHLORIDE CRYS ER 20 MEQ PO TBCR: 20.0000 meq | EXTENDED_RELEASE_TABLET | Freq: Every day | ORAL | 3 refills | Status: DC

## 2021-06-08 NOTE — Telephone Encounter (Signed)
-----   Message from Rafael Bihari, [place] sent at 06/07/2021  4:52 PM EDT ----- K is low end of normal. With diuretic use, please start 20 KCL daily and we will recheck at next visit.

## 2021-06-12 NOTE — Telephone Encounter (Signed)
Pt called stating she can not take potassium it is giving her terrible diarrhea. Pt stopped taking it yesterday. I asked pt if she took any medication for diarrhea and she said she took some imodium and it helped. I explained to patient the risks of having low potassium but told her I would follow up with a provider and call her back.    Routed to [employer] for advice

## 2021-06-12 NOTE — Telephone Encounter (Signed)
Called pt no answer/no vm set up. Will try patient again tomorrow.

## 2021-06-13 NOTE — Telephone Encounter (Signed)
Pt aware and agreeable.  

## 2021-06-15 ENCOUNTER — Other Ambulatory Visit: Payer: Medicare PPO

## 2021-06-16 ENCOUNTER — Ambulatory Visit (INDEPENDENT_AMBULATORY_CARE_PROVIDER_SITE_OTHER): Payer: Medicare PPO

## 2021-06-16 DIAGNOSIS — I495 Sick sinus syndrome: Secondary | ICD-10-CM

## 2021-06-20 LAB — CUP PACEART REMOTE DEVICE CHECK
Battery Remaining Longevity: 52 mo
Battery Voltage: 2.99 V
Brady Statistic AP VP Percent: 0.05 %
Brady Statistic AP VS Percent: 99.9 %
Brady Statistic AS VP Percent: 0 %
Brady Statistic AS VS Percent: 0.05 %
Brady Statistic RA Percent Paced: 99.93 %
Brady Statistic RV Percent Paced: 0.05 %
Date Time Interrogation Session: 20221007173158
Implantable Lead Implant Date: 20161110
Implantable Lead Implant Date: 20161110
Implantable Lead Location: 753859
Implantable Lead Location: 753860
Implantable Lead Model: 5076
Implantable Lead Model: 5076
Implantable Pulse Generator Implant Date: 20161110
Lead Channel Impedance Value: 361 Ohm
Lead Channel Impedance Value: 380 Ohm
Lead Channel Impedance Value: 399 Ohm
Lead Channel Impedance Value: 437 Ohm
Lead Channel Pacing Threshold Amplitude: 0.625 V
Lead Channel Pacing Threshold Amplitude: 1.5 V
Lead Channel Pacing Threshold Pulse Width: 0.4 ms
Lead Channel Pacing Threshold Pulse Width: 0.4 ms
Lead Channel Sensing Intrinsic Amplitude: 4.25 mV
Lead Channel Sensing Intrinsic Amplitude: 4.25 mV
Lead Channel Sensing Intrinsic Amplitude: 5.375 mV
Lead Channel Sensing Intrinsic Amplitude: 5.375 mV
Lead Channel Setting Pacing Amplitude: 1.5 V
Lead Channel Setting Pacing Amplitude: 3.25 V
Lead Channel Setting Pacing Pulse Width: 0.4 ms
Lead Channel Setting Sensing Sensitivity: 2.8 mV

## 2021-06-21 DIAGNOSIS — D631 Anemia in chronic kidney disease: Secondary | ICD-10-CM | POA: Diagnosis not present

## 2021-06-21 DIAGNOSIS — E1122 Type 2 diabetes mellitus with diabetic chronic kidney disease: Secondary | ICD-10-CM | POA: Diagnosis not present

## 2021-06-21 DIAGNOSIS — N189 Chronic kidney disease, unspecified: Secondary | ICD-10-CM | POA: Diagnosis not present

## 2021-06-21 DIAGNOSIS — I5030 Unspecified diastolic (congestive) heart failure: Secondary | ICD-10-CM | POA: Diagnosis not present

## 2021-06-21 DIAGNOSIS — N2581 Secondary hyperparathyroidism of renal origin: Secondary | ICD-10-CM | POA: Diagnosis not present

## 2021-06-21 DIAGNOSIS — I129 Hypertensive chronic kidney disease with stage 1 through stage 4 chronic kidney disease, or unspecified chronic kidney disease: Secondary | ICD-10-CM | POA: Diagnosis not present

## 2021-06-21 DIAGNOSIS — E785 Hyperlipidemia, unspecified: Secondary | ICD-10-CM | POA: Diagnosis not present

## 2021-06-21 DIAGNOSIS — N1832 Chronic kidney disease, stage 3b: Secondary | ICD-10-CM | POA: Diagnosis not present

## 2021-06-22 ENCOUNTER — Other Ambulatory Visit: Payer: Self-pay

## 2021-06-22 ENCOUNTER — Ambulatory Visit: Payer: Medicare PPO | Admitting: *Deleted

## 2021-06-22 DIAGNOSIS — M14679 Charcot's joint, unspecified ankle and foot: Secondary | ICD-10-CM

## 2021-06-22 DIAGNOSIS — E1149 Type 2 diabetes mellitus with other diabetic neurological complication: Secondary | ICD-10-CM

## 2021-06-22 DIAGNOSIS — E1140 Type 2 diabetes mellitus with diabetic neuropathy, unspecified: Secondary | ICD-10-CM

## 2021-06-22 NOTE — Progress Notes (Signed)
Patient presents today to pick up reordered diabetic shoes.  Patient was dispensed 1 pair of diabetic shoes. Fit was satisfactory. Instructions for break-in and wear was reviewed.  Re-appointment for regularly scheduled diabetic foot care visits or if they should experience any trouble with the shoes or insoles.  

## 2021-06-27 NOTE — Progress Notes (Signed)
Remote pacemaker transmission.   

## 2021-06-30 DIAGNOSIS — H2511 Age-related nuclear cataract, right eye: Secondary | ICD-10-CM | POA: Diagnosis not present

## 2021-06-30 DIAGNOSIS — H2513 Age-related nuclear cataract, bilateral: Secondary | ICD-10-CM | POA: Diagnosis not present

## 2021-06-30 DIAGNOSIS — H2512 Age-related nuclear cataract, left eye: Secondary | ICD-10-CM | POA: Diagnosis not present

## 2021-07-04 ENCOUNTER — Encounter (HOSPITAL_COMMUNITY): Payer: Medicare PPO

## 2021-07-11 NOTE — Progress Notes (Addendum)
Advanced Heart Failure Clinic Note   Date:  07/12/2021   ID:  [name], DOB 01-Apr-1941, MRN [id number]  Location: Home  Provider location: [facility] Type of Visit: Established patient  PCP:  Caren Macadam, MD  Cardiologist:  Fransico Him, MD Nephrology: Dr. Justin Mend Primary HF: B[place]hon  Chief Complaint: Heart Failure follow-up   HPI: [name] is a 80 y.o.Marland Kitchen female with history of diastolic heart failure, DM, GERD, colon cancer, CKD stage III, 2016 pacemaker (MDT) due to symptomatic sinus node dysfunction.  In 7/19, she had 12 pound weight and she was sent to the ED by her PCP. She was sent home on metolazone every other day for 14 days. She took 4 doses and her creatinine went up to 3 so metolazone was stopped.   R/L cath in 9/19. Normal cors. Elevated filling pressures with pulmonary venous HTN.   Had fall with R tibia fracture in 9/21    Having more dyspnea at follow up 2/22, arranged for RHC and repeat echo.  RHC (2/22) showed mild to moderate elevated filling pressures with prominent v waves in PCWP tracing, mild pulmonary HTN and high CO with no evidence of intracardiac shunting. Echo (4/22) showed EF 60-65%, RV ok, mild AS.  SCr remained elevated on lab check at EP visit and irbesartan stopped.  Today she returns for HF follow up. She is not significantly SOB with walking on flat ground, breathing is about the same. Overall feeling fine. Not steady on her feet and has some light headedness, no falls recently. Denies CP, abnormal bleeding, or edema. Chronically sleeps in a recliner. Appetite ok, has been limiting fluids recently to 30 oz/day. No fever or chills. Weight at home 204 pounds. Taking all medications.   Cardiac Studies: - Echo (4/22): EF 60-65%, RV ok, mild AS mean gradient 21.0 mmHg  - RHC (2/22):  RA = 9 RV = 49/13 PA = 49/17 (33) PCW = 22 ( v waves 28) Fick cardiac output/index = 8.0/3.9 PVR = 1.6 WU FA sat =  95% PA sat = 74%, 73% High SVC sat = 69%  Assessment: 1. Mild to moderately elevated filling pressures with prominent v waves in PCWP tracing suggestive of diastolic dysfunction versus mitral regurgitation 2. Mild pulmonary venous HTN 3. High cardiac output with no evidence of intracardiac shunting  - PFTs 05/23/18: FEV1: 1.72 FEV1/FVC ratio: 77% DLCO: 70%  - R/LHC 05/19/18: Ao = 192/74 (118) LV = 201/26 RA = 14 RV = 53/18 PA = 53/19 (38) PCW = 27 (v = 41) Fick cardiac output/index = 5.6/2.8 PVR = 2.0 WU Ao sat = 95% PA sat = 64%, 66% Assessment: 1. Normal coronaries with ectactic vessels suggestive of longstanding HTN 2. Severe HTN 3. Normal LV function 4. Signficantly elevated filling pressures with pulmonary venous HTN in setting of holding diuretics for 2 days Plan/Discussion: Filling pressures and BP elevated. Will resume diuretics. Will need aggressive titration of ant-HTN regimen.  - Echo 05/14/18: - Left ventricle: Posterior basal and inferolateral hypokinesis The   cavity size was moderately dilated. Wall thickness was increased   in a pattern of mild LVH. Systolic function was normal. The   estimated ejection fraction was in the range of 50% to 55%. Left   ventricular diastolic function parameters were normal. - Aortic valve: Sclerosis without stenosis. - Mitral valve: Moderately calcified annulus. Moderately thickened,   mildly calcified leaflets . - Left atrium: The atrium was mildly dilated. -  Atrial septum: No defect or patent foramen ovale was identified.   -Sleep study 10/19: AHI 49  - 06/21/2015 Myoview  Nuclear stress EF: 64%. There was no ST segment deviation noted during stress. The study is normal. This is a low risk study. No ischemia identified.  SH: Former 30-40 pack year smoker, quit 25 years ago.   Past Medical History:  Diagnosis Date   Anxiety    Arthritis    Cancer ([place])    colon   CHF (congestive heart failure) (HCC)    Chronic  kidney disease    stage 3   Chronic pain syndrome    Diabetes (HCC)    Diabetic neuropathy (HCC)    Dyslipidemia    Early cataracts, bilateral    Fibromyalgia    GERD (gastroesophageal reflux disease)    H/O syncope    Heart murmur    Hypertension    Lumbar spinal stenosis    and scoliosis   Pneumonia    PONV (postoperative nausea and vomiting)    Presence of permanent cardiac pacemaker    Restless legs    Spinal headache    Thyroid nodule    Past Surgical History:  Procedure Laterality Date   APPENDECTOMY     BREAST SURGERY     lumpectomy   COLON SURGERY     DILATION AND CURETTAGE OF UTERUS     EP IMPLANTABLE DEVICE N/A 07/21/2015   Procedure: Pacemaker Implant;  Surgeon: Deboraha Sprang, MD;  Location: [facility];  Service: Cardiovascular;  Laterality: N/A;   LUMBAR LAMINECTOMY/DECOMPRESSION MICRODISCECTOMY N/A 02/02/2016   Procedure: DECOMPRESSION L4-L5 WITH INSITE 2 FUSION ;  Surgeon: Melina Schools, MD;  Location: [facility];  Service: Orthopedics;  Laterality: N/A;   PARTIAL NEPHRECTOMY     RIGHT HEART CATH N/A 11/02/2020   Procedure: RIGHT HEART CATH;  Surgeon: Jolaine Artist, MD;  Location: [facility];  Service: Cardiovascular;  Laterality: N/A;   RIGHT/LEFT HEART CATH AND CORONARY ANGIOGRAPHY N/A 05/19/2018   Procedure: RIGHT/LEFT HEART CATH AND CORONARY ANGIOGRAPHY;  Surgeon: Jolaine Artist, MD;  Location: [facility];  Service: Cardiovascular;  Laterality: N/A;   TIBIA IM NAIL INSERTION Right 05/19/2020   Procedure: INTRAMEDULLARY (IM) NAIL TIBIAL;  Surgeon: Nicholes Stairs, MD;  Location: [facility];  Service: Orthopedics;  Laterality: Right;   TONSILLECTOMY     TUBAL LIGATION     Current Outpatient Medications  Medication Sig Dispense Refill   ACCU-CHEK AVIVA PLUS test strip      Accu-Chek Softclix Lancets lancets      B-D UF III MINI PEN NEEDLES 31G X 5 MM MISC      Blood Glucose Monitoring Suppl (ACCU-CHEK AVIVA PLUS) w/Device KIT       calcium carbonate (OS-CAL) 600 MG TABS tablet Take 600 mg by mouth daily with breakfast.     Cholecalciferol (VITAMIN D) 2000 UNITS tablet Take 2,000 Units by mouth daily.     Cromolyn Sodium (NASAL ALLERGY NA) Place 1 spray into the nose at bedtime as needed (sleep).     cyclobenzaprine (FLEXERIL) 10 MG tablet Take 10 mg by mouth 3 (three) times daily as needed for muscle spasms.     diclofenac Sodium (VOLTAREN) 1 % GEL Apply 1 application topically at bedtime as needed (sleep).     diphenhydrAMINE (BENADRYL) 25 MG tablet Take 25 mg by mouth daily as needed for itching or allergies.     DULoxetine (CYMBALTA) 20 MG capsule  Take 20 mg by mouth daily.     empagliflozin (JARDIANCE) 10 MG TABS tablet Take 1 tablet (10 mg total) by mouth daily before breakfast. 90 tablet 3   gabapentin (NEURONTIN) 300 MG capsule Take 300 mg by mouth at bedtime.      hydrALAZINE (APRESOLINE) 100 MG tablet Take 1 tablet (100 mg total) by mouth 3 (three) times daily. 90 tablet 6   insulin degludec (TRESIBA FLEXTOUCH) 200 UNIT/ML FlexTouch Pen Inject 70 Units into the skin daily.     labetalol (NORMODYNE) 200 MG tablet TAKE 1 TABLET(200 MG) BY MOUTH TWICE DAILY 180 tablet 3   potassium chloride SA (KLOR-CON) 20 MEQ tablet Take 1 tablet (20 mEq total) by mouth daily. 90 tablet 3   pramipexole (MIRAPEX) 0.125 MG tablet Take 0.25 mg by mouth 2 (two) times daily.     rosuvastatin (CRESTOR) 10 MG tablet Take 10 mg by mouth daily.      torsemide (DEMADEX) 20 MG tablet Take 1 tablet (20 mg total) by mouth every other day, take 2 tablets (40 mg total) by mouth on alternating days. 45 tablet 1   traMADol (ULTRAM) 50 MG tablet Take 2 tablets by mouth daily as needed.     No current facility-administered medications for this encounter.   Allergies:   Penicillins, Codeine, Adhesive [tape], Aspirin, Crab (diagnostic), Iodine, Ivp dye [iodinated diagnostic agents], and Sulfa antibiotics   Social History:  The patient  reports that  she quit smoking about 37 years ago. Her smoking use included cigarettes. She started smoking about 67 years ago. She has never used smokeless tobacco. She reports current alcohol use. She reports that she does not use drugs.   Family History:  The patient's family history includes CAD in an other family member; Sick sinus syndrome in her brother.   ROS:  Please see the history of present illness.   All other systems are personally reviewed and negative.   Recent Labs: 05/05/2021: Hemoglobin 12.5; Platelets 154 05/08/2021: NT-Pro BNP 596 06/07/2021: B Natriuretic Peptide 26.2; BUN 51; Creatinine, Ser 1.90; Potassium 3.5; Sodium 136  Personally reviewed   Wt Readings from Last 3 Encounters:  07/12/21 95.9 kg (211 lb 6.4 oz)  06/07/21 98.3 kg (216 lb 12.8 oz)  05/05/21 98.9 kg (218 lb)    BP (!) 142/62   Pulse 79   Wt 95.9 kg (211 lb 6.4 oz)   SpO2 93%   BMI 37.45 kg/m   Exam:   General:  NAD. No resp difficulty HEENT: Normal Neck: Supple. No JVD. Carotids 2+ bilat; no bruits. No lymphadenopathy or thryomegaly appreciated. Cor: PMI nondisplaced. Regular rate & rhythm. No rubs, gallops, 2/6 AS Lungs: Clear Abdomen: Obese,  nontender, nondistended. No hepatosplenomegaly. No bruits or masses. Good bowel sounds. Extremities: No cyanosis, clubbing, rash, 2+ BLE pre-tibial edema Neuro: Alert & oriented x 3, cranial nerves grossly intact. Moves all 4 extremities w/o difficulty. Affect pleasant.  Device Interrogation (personally reviewed): Daily activity 0.6 hr, no recent AF  ReDs: 30%  ASSESSMENT AND PLAN:  1. Chronic Diastolic Heart Failure  - Echo 05/14/18: EF 50-55%, posterior basal and inferolateral HK, mild LVH, LA mildly dilated - R/LHC 05/19/18: normal coronaries, elevated filling pressure (had held diuretics x2 days), severe HTN. - RHC (2/22): Mild to moderate elevated filling pressures, mild pulm venous HTN, high CO with no shunting. - Echo (4/22): EF 60-65%, RV ok, mild AS mean  gradient 21.0 mmHg - Stable NYHA II-III, suspect she is limited more by  body habitus and general deconditioning. - Volume looks OK, LEE appears more consistent with venous insufficiency, ReDs 30%. Weight down 5 lbs.  - Needs better compliance w/ compression hose. She is agreeable to this. May need UNNA boots, she will notify clinic if swelling does not improve. - Continue torsemide 40 mg daily alternating with 20 mg every other day. - Continue Jardiance 10 mg daily. - No ARB with elevated SCr. - No spiro with previous AKI and hyperkalemia. - BMET today.  2. HTN, severe - Blood pressure controlled.  - Continue current regimen.  3. OSA  - Severe, AHI 49 by PSG in 10/19 - f/u testing AHI 11 - Had televisit with Dr. Radford Pax on 09/30/19. She refused CPAP due to claustrophobia.  - Continues to refuse.    4. Dyspnea - LHC 05/19/18 with normal coronaries. - PFTs with DLCO 70%. - RHC (2/22): Mild pulmonary venous hypertension - Likely mostly due to obesity + OSA; seems less bothersome to her today. - Stressed need again for weight loss with low-carb diet.    5. CKD Stage IIIb-IV - Followed by Dr. Justin Mend - Baseline SCr 1.8 - On SGLT2i for now - Labs today.   6. Sinus node dysfunction s/p MDT PPM - Follows with Dr Caryl Comes.   7. DM - Continue Jardiance.  8. Severe obesity - Again stressed need for more activity and [employer]. - Body mass index is 37.45 kg/m.   Follow up with Dr. Haroldine Laws as scheduled.   [place], FNP  07/12/2021 2:50 PM  [facility] [address] [facility] [place] [address] [phone] (office) [phone] (fax)

## 2021-07-12 ENCOUNTER — Other Ambulatory Visit: Payer: Self-pay

## 2021-07-12 ENCOUNTER — Encounter (HOSPITAL_COMMUNITY): Payer: Self-pay

## 2021-07-12 ENCOUNTER — Ambulatory Visit (HOSPITAL_COMMUNITY)
Admission: RE | Admit: 2021-07-12 | Discharge: 2021-07-12 | Disposition: A | Discharge status: Home / Self Care | Payer: Medicare PPO | Source: Ambulatory Visit | Attending: Internal Medicine | Admitting: Internal Medicine

## 2021-07-12 VITALS — BP 142/62 | HR 79 | Wt 211.4 lb

## 2021-07-12 DIAGNOSIS — I10 Essential (primary) hypertension: Secondary | ICD-10-CM

## 2021-07-12 DIAGNOSIS — I5022 Chronic systolic (congestive) heart failure: Secondary | ICD-10-CM | POA: Diagnosis not present

## 2021-07-12 DIAGNOSIS — Z79899 Other long term (current) drug therapy: Secondary | ICD-10-CM | POA: Diagnosis not present

## 2021-07-12 DIAGNOSIS — Z85038 Personal history of other malignant neoplasm of large intestine: Secondary | ICD-10-CM | POA: Insufficient documentation

## 2021-07-12 DIAGNOSIS — N1832 Chronic kidney disease, stage 3b: Secondary | ICD-10-CM | POA: Insufficient documentation

## 2021-07-12 DIAGNOSIS — Z794 Long term (current) use of insulin: Secondary | ICD-10-CM | POA: Diagnosis not present

## 2021-07-12 DIAGNOSIS — Z87891 Personal history of nicotine dependence: Secondary | ICD-10-CM | POA: Diagnosis not present

## 2021-07-12 DIAGNOSIS — I272 Pulmonary hypertension, unspecified: Secondary | ICD-10-CM | POA: Insufficient documentation

## 2021-07-12 DIAGNOSIS — I495 Sick sinus syndrome: Secondary | ICD-10-CM | POA: Diagnosis not present

## 2021-07-12 DIAGNOSIS — Z7984 Long term (current) use of oral hypoglycemic drugs: Secondary | ICD-10-CM | POA: Insufficient documentation

## 2021-07-12 DIAGNOSIS — Z885 Allergy status to narcotic agent status: Secondary | ICD-10-CM | POA: Diagnosis not present

## 2021-07-12 DIAGNOSIS — Z6837 Body mass index (BMI) 37.0-37.9, adult: Secondary | ICD-10-CM | POA: Insufficient documentation

## 2021-07-12 DIAGNOSIS — E1122 Type 2 diabetes mellitus with diabetic chronic kidney disease: Secondary | ICD-10-CM | POA: Insufficient documentation

## 2021-07-12 DIAGNOSIS — Z888 Allergy status to other drugs, medicaments and biological substances status: Secondary | ICD-10-CM | POA: Diagnosis not present

## 2021-07-12 DIAGNOSIS — Z886 Allergy status to analgesic agent status: Secondary | ICD-10-CM | POA: Diagnosis not present

## 2021-07-12 DIAGNOSIS — R0600 Dyspnea, unspecified: Secondary | ICD-10-CM

## 2021-07-12 DIAGNOSIS — G4733 Obstructive sleep apnea (adult) (pediatric): Secondary | ICD-10-CM | POA: Insufficient documentation

## 2021-07-12 DIAGNOSIS — Z882 Allergy status to sulfonamides status: Secondary | ICD-10-CM | POA: Insufficient documentation

## 2021-07-12 DIAGNOSIS — I130 Hypertensive heart and chronic kidney disease with heart failure and stage 1 through stage 4 chronic kidney disease, or unspecified chronic kidney disease: Secondary | ICD-10-CM | POA: Insufficient documentation

## 2021-07-12 DIAGNOSIS — Z8249 Family history of ischemic heart disease and other diseases of the circulatory system: Secondary | ICD-10-CM | POA: Insufficient documentation

## 2021-07-12 DIAGNOSIS — Z950 Presence of cardiac pacemaker: Secondary | ICD-10-CM | POA: Diagnosis not present

## 2021-07-12 DIAGNOSIS — Z880 Allergy status to penicillin: Secondary | ICD-10-CM | POA: Insufficient documentation

## 2021-07-12 DIAGNOSIS — I5032 Chronic diastolic (congestive) heart failure: Secondary | ICD-10-CM | POA: Diagnosis not present

## 2021-07-12 DIAGNOSIS — K219 Gastro-esophageal reflux disease without esophagitis: Secondary | ICD-10-CM | POA: Insufficient documentation

## 2021-07-12 LAB — BASIC METABOLIC PANEL
Anion gap: 11 (ref 5–15)
BUN: 46 mg/dL — ABNORMAL HIGH (ref 8–23)
CO2: 27 mmol/L (ref 22–32)
Calcium: 9.2 mg/dL (ref 8.9–10.3)
Chloride: 101 mmol/L (ref 98–111)
Creatinine, Ser: 1.86 mg/dL — ABNORMAL HIGH (ref 0.44–1.00)
GFR, Estimated: 27 mL/min — ABNORMAL LOW (ref >60)
Glucose, Bld: 164 mg/dL — ABNORMAL HIGH (ref 70–99)
Potassium: 3.4 mmol/L — ABNORMAL LOW (ref 3.5–5.1)
Sodium: 139 mmol/L (ref 135–145)

## 2021-07-12 MED ORDER — LABETALOL HCL 200 MG PO TABS: 200.0000 mg | ORAL_TABLET | Freq: Two times a day (BID) | ORAL | 5 refills | Status: DC

## 2021-07-12 NOTE — Progress Notes (Signed)
ReDS Vest / Clip - 07/12/21 1400       ReDS Vest / Clip   Station Marker B    Ruler Value 34    ReDS Value Range Low volume    ReDS Actual Value 30

## 2021-07-12 NOTE — Patient Instructions (Signed)
Labs were done today, if any labs are abnormal the clinic will call you  RESTART Labetalol 200 mg 1 tablet 2 times a day  PLEASE LIMIT YOUR FLUID INTAKE TO 64 OUNCES OR 2 LITERS DAILY   Your physician recommends that you schedule a follow-up appointment in: please keep appointment scheduled with Dr. Haroldine Laws   PLEASE WEAR COMPRESSION HOSE  At [facility], you and your health needs are our priority. As part of our continuing mission to provide you with exceptional heart care, we have created designated Provider Care Teams. These Care Teams include your primary Cardiologist (physician) and Advanced Practice Providers (APPs- Physician Assistants and Nurse Practitioners) who all work together to provide you with the care you need, when you need it.   You may see any of the following providers on your designated Care Team at your next follow up: Dr Glori Bickers Dr Haynes Kerns, NP Lyda Jester, [place] [facility] [place], [place] Audry Riles, PharmD   Please be sure to bring in all your medications bottles to every appointment.   If you have any questions or concerns before your next appointment please send [place] a message through [place] or call our office at [phone].    TO LEAVE A MESSAGE FOR THE NURSE SELECT OPTION 2, PLEASE LEAVE A MESSAGE INCLUDING: YOUR NAME DATE OF BIRTH CALL BACK NUMBER REASON FOR CALL**this is important as we prioritize the call backs  YOU WILL RECEIVE A CALL BACK THE SAME DAY AS LONG AS YOU CALL BEFORE 4:00 PM

## 2021-07-13 ENCOUNTER — Telehealth (HOSPITAL_COMMUNITY): Payer: Self-pay

## 2021-07-13 DIAGNOSIS — I5032 Chronic diastolic (congestive) heart failure: Secondary | ICD-10-CM

## 2021-07-13 MED ORDER — POTASSIUM CHLORIDE CRYS ER 20 MEQ PO TBCR: 40.0000 meq | EXTENDED_RELEASE_TABLET | Freq: Every day | ORAL | 3 refills | Status: DC

## 2021-07-13 NOTE — Telephone Encounter (Signed)
Pt aware of lab results. Pt taking 20 meq daily. Will increase to 40 meq daily and recheck labs in 2 weeks. Verbalized understanding.

## 2021-07-13 NOTE — Telephone Encounter (Signed)
-----   Message from Rafael Bihari, Addy sent at 07/13/2021  7:51 AM EDT ----- Potassium is low. Has she been consistently take 20 mEq of KCL? If so, please increase to 40 mEq daily. Repeat BMET in 2 weeks

## 2021-07-26 ENCOUNTER — Other Ambulatory Visit (HOSPITAL_COMMUNITY): Payer: Medicare PPO

## 2021-07-28 DIAGNOSIS — H2511 Age-related nuclear cataract, right eye: Secondary | ICD-10-CM | POA: Diagnosis not present

## 2021-07-28 DIAGNOSIS — H2513 Age-related nuclear cataract, bilateral: Secondary | ICD-10-CM | POA: Diagnosis not present

## 2021-08-08 ENCOUNTER — Other Ambulatory Visit (HOSPITAL_COMMUNITY): Payer: Medicare PPO

## 2021-08-11 ENCOUNTER — Other Ambulatory Visit (HOSPITAL_COMMUNITY): Payer: Self-pay | Admitting: *Deleted

## 2021-08-11 MED ORDER — POTASSIUM CHLORIDE CRYS ER 20 MEQ PO TBCR: 40.0000 meq | EXTENDED_RELEASE_TABLET | Freq: Every day | ORAL | 3 refills | Status: DC

## 2021-08-15 ENCOUNTER — Other Ambulatory Visit: Payer: Self-pay

## 2021-08-15 ENCOUNTER — Ambulatory Visit (HOSPITAL_COMMUNITY)
Admission: RE | Admit: 2021-08-15 | Discharge: 2021-08-15 | Disposition: A | Discharge status: Home / Self Care | Payer: Medicare PPO | Source: Ambulatory Visit | Attending: Cardiology | Admitting: Cardiology

## 2021-08-15 DIAGNOSIS — I5032 Chronic diastolic (congestive) heart failure: Secondary | ICD-10-CM | POA: Insufficient documentation

## 2021-08-15 LAB — BASIC METABOLIC PANEL
Anion gap: 12 (ref 5–15)
BUN: 53 mg/dL — ABNORMAL HIGH (ref 8–23)
CO2: 27 mmol/L (ref 22–32)
Calcium: 9.3 mg/dL (ref 8.9–10.3)
Chloride: 97 mmol/L — ABNORMAL LOW (ref 98–111)
Creatinine, Ser: 2.07 mg/dL — ABNORMAL HIGH (ref 0.44–1.00)
GFR, Estimated: 23 mL/min — ABNORMAL LOW (ref >60)
Glucose, Bld: 149 mg/dL — ABNORMAL HIGH (ref 70–99)
Potassium: 3.6 mmol/L (ref 3.5–5.1)
Sodium: 136 mmol/L (ref 135–145)

## 2021-08-16 ENCOUNTER — Other Ambulatory Visit (HOSPITAL_COMMUNITY): Payer: Self-pay | Admitting: *Deleted

## 2021-08-16 MED ORDER — POTASSIUM CHLORIDE CRYS ER 20 MEQ PO TBCR: 40.0000 meq | EXTENDED_RELEASE_TABLET | Freq: Every day | ORAL | 3 refills | Status: DC

## 2021-08-21 DIAGNOSIS — E109 Type 1 diabetes mellitus without complications: Secondary | ICD-10-CM | POA: Diagnosis not present

## 2021-08-23 ENCOUNTER — Other Ambulatory Visit: Payer: Medicare PPO

## 2021-08-23 ENCOUNTER — Other Ambulatory Visit: Payer: Self-pay | Admitting: Family Medicine

## 2021-08-23 DIAGNOSIS — Z780 Asymptomatic menopausal state: Secondary | ICD-10-CM

## 2021-08-25 ENCOUNTER — Telehealth: Payer: Self-pay | Admitting: Podiatry

## 2021-08-25 NOTE — Telephone Encounter (Signed)
Pt called and left message stating she got the diabetic shoes a while ago and does not like them and they are not comfortable. She is asking if she can return them and she states she has not worn them outside.  I returned call and she states the shoes are ugly and she will never wear them and her feet slip in them. I tried to schedule pt to come in to see Aaron Edelman but she stated she did not have her calendar with her and could not get up to get it and will call back.

## 2021-08-29 ENCOUNTER — Encounter (HOSPITAL_COMMUNITY): Payer: Medicare PPO | Admitting: Internal Medicine

## 2021-09-06 DIAGNOSIS — G894 Chronic pain syndrome: Secondary | ICD-10-CM | POA: Diagnosis not present

## 2021-09-07 ENCOUNTER — Ambulatory Visit: Payer: Medicare PPO

## 2021-09-13 DIAGNOSIS — D631 Anemia in chronic kidney disease: Secondary | ICD-10-CM | POA: Diagnosis not present

## 2021-09-13 DIAGNOSIS — I5030 Unspecified diastolic (congestive) heart failure: Secondary | ICD-10-CM | POA: Diagnosis not present

## 2021-09-13 DIAGNOSIS — E1122 Type 2 diabetes mellitus with diabetic chronic kidney disease: Secondary | ICD-10-CM | POA: Diagnosis not present

## 2021-09-13 DIAGNOSIS — N390 Urinary tract infection, site not specified: Secondary | ICD-10-CM | POA: Diagnosis not present

## 2021-09-13 DIAGNOSIS — I129 Hypertensive chronic kidney disease with stage 1 through stage 4 chronic kidney disease, or unspecified chronic kidney disease: Secondary | ICD-10-CM | POA: Diagnosis not present

## 2021-09-13 DIAGNOSIS — N184 Chronic kidney disease, stage 4 (severe): Secondary | ICD-10-CM | POA: Diagnosis not present

## 2021-09-13 DIAGNOSIS — N2581 Secondary hyperparathyroidism of renal origin: Secondary | ICD-10-CM | POA: Diagnosis not present

## 2021-09-19 ENCOUNTER — Other Ambulatory Visit: Payer: Self-pay

## 2021-09-19 ENCOUNTER — Ambulatory Visit: Payer: Medicare Other

## 2021-09-19 DIAGNOSIS — E1149 Type 2 diabetes mellitus with other diabetic neurological complication: Secondary | ICD-10-CM

## 2021-09-19 DIAGNOSIS — M14679 Charcot's joint, unspecified ankle and foot: Secondary | ICD-10-CM

## 2021-09-19 DIAGNOSIS — E1140 Type 2 diabetes mellitus with diabetic neuropathy, unspecified: Secondary | ICD-10-CM

## 2021-09-19 NOTE — Progress Notes (Signed)
SITUATION Reason for Consult: Follow-up with diabetic shoes and insoles Patient / Caregiver Report: Patient cannot wear shoes and insoles  OBJECTIVE DATA History / Diagnosis:    ICD-10-CM   1. Diabetic neuropathy with neurologic complication (HCC)  N36.14    E11.49     2. Charcot's joint of ankle, unspecified laterality  M14.679       Change in Pathology: None  ACTIONS PERFORMED Patient's equipment was checked for structural stability and fit. Recasted and remeasured patient for A3200W 9W shoes and A5513 insoles Device(s) intact and fit is excellent. All questions answered and concerns addressed.  PLAN Fit in four weeks. Plan of care discussed with and agreed upon by patient / caregiver.

## 2021-09-22 DIAGNOSIS — I10 Essential (primary) hypertension: Secondary | ICD-10-CM | POA: Diagnosis not present

## 2021-09-22 DIAGNOSIS — E118 Type 2 diabetes mellitus with unspecified complications: Secondary | ICD-10-CM | POA: Diagnosis not present

## 2021-09-22 DIAGNOSIS — I509 Heart failure, unspecified: Secondary | ICD-10-CM | POA: Diagnosis not present

## 2021-09-22 DIAGNOSIS — Z23 Encounter for immunization: Secondary | ICD-10-CM | POA: Diagnosis not present

## 2021-09-22 DIAGNOSIS — M25511 Pain in right shoulder: Secondary | ICD-10-CM | POA: Diagnosis not present

## 2021-09-27 NOTE — Progress Notes (Incomplete)
Advanced Heart Failure Clinic Note   Date:  09/27/2021   ID:  Adreanna Fickel, DOB 03/15/1941, MRN [id number]  Location: Home  Provider location: [facility] Type of Visit: Established patient  PCP:  Caren Macadam, MD  Cardiologist:  Fransico Him, MD Nephrology: Dr. Justin Mend Primary HF: Bensimhon  Chief Complaint: Heart Failure follow-up   HPI: Chania Kochanski is a 81 y.o.Marland Kitchen female with history of diastolic heart failure, DM, GERD, colon cancer, CKD stage III, 2016 pacemaker (MDT) due to symptomatic sinus node dysfunction.  In 7/19, she had 12 pound weight and she was sent to the ED by her PCP. She was sent home on metolazone every other day for 14 days. She took 4 doses and her creatinine went up to 3 so metolazone was stopped.   R/L cath in 9/19. Normal cors. Elevated filling pressures with pulmonary venous HTN.   Had fall with R tibia fracture in 9/21    Having more dyspnea at follow up 2/22, arranged for RHC and repeat echo.  RHC (2/22) showed mild to moderate elevated filling pressures with prominent v waves in PCWP tracing, mild pulmonary HTN and high CO with no evidence of intracardiac shunting. Echo (4/22) showed EF 60-65%, RV ok, mild AS.  SCr remained elevated on lab check at EP visit and irbesartan stopped.  Today she returns for HF follow up. She is not significantly SOB with walking on flat ground, breathing is about the same. Overall feeling fine. Not steady on her feet and has some light headedness, no falls recently. Denies CP, abnormal bleeding, or edema. Chronically sleeps in a recliner. Appetite ok, has been limiting fluids recently to 30 oz/day. No fever or chills. Weight at home 204 pounds. Taking all medications.   Cardiac Studies: - Echo (4/22): EF 60-65%, RV ok, mild AS mean gradient 21.0 mmHg  - RHC (2/22):  RA = 9 RV = 49/13 PA = 49/17 (33) PCW = 22 ( v waves 28) Fick cardiac output/index = 8.0/3.9 PVR = 1.6 WU FA sat =  95% PA sat = 74%, 73% High SVC sat = 69%  Assessment: 1. Mild to moderately elevated filling pressures with prominent v waves in PCWP tracing suggestive of diastolic dysfunction versus mitral regurgitation 2. Mild pulmonary venous HTN 3. High cardiac output with no evidence of intracardiac shunting  - PFTs 05/23/18: FEV1: 1.72 FEV1/FVC ratio: 77% DLCO: 70%  - R/LHC 05/19/18: Ao = 192/74 (118) LV = 201/26 RA = 14 RV = 53/18 PA = 53/19 (38) PCW = 27 (v = 41) Fick cardiac output/index = 5.6/2.8 PVR = 2.0 WU Ao sat = 95% PA sat = 64%, 66% Assessment: 1. Normal coronaries with ectactic vessels suggestive of longstanding HTN 2. Severe HTN 3. Normal LV function 4. Signficantly elevated filling pressures with pulmonary venous HTN in setting of holding diuretics for 2 days Plan/Discussion: Filling pressures and BP elevated. Will resume diuretics. Will need aggressive titration of ant-HTN regimen.  - Echo 05/14/18: - Left ventricle: Posterior basal and inferolateral hypokinesis The   cavity size was moderately dilated. Wall thickness was increased   in a pattern of mild LVH. Systolic function was normal. The   estimated ejection fraction was in the range of 50% to 55%. Left   ventricular diastolic function parameters were normal. - Aortic valve: Sclerosis without stenosis. - Mitral valve: Moderately calcified annulus. Moderately thickened,   mildly calcified leaflets . - Left atrium: The atrium was mildly dilated. -  Atrial septum: No defect or patent foramen ovale was identified.   -Sleep study 10/19: AHI 49  - 06/21/2015 Myoview  Nuclear stress EF: 64%. There was no ST segment deviation noted during stress. The study is normal. This is a low risk study. No ischemia identified.  SH: Former 30-40 pack year smoker, quit 25 years ago.   Past Medical History:  Diagnosis Date   Anxiety    Arthritis    Cancer ([place])    colon   CHF (congestive heart failure) (HCC)    Chronic  kidney disease    stage 3   Chronic pain syndrome    Diabetes (HCC)    Diabetic neuropathy (HCC)    Dyslipidemia    Early cataracts, bilateral    Fibromyalgia    GERD (gastroesophageal reflux disease)    H/O syncope    Heart murmur    Hypertension    Lumbar spinal stenosis    and scoliosis   Pneumonia    PONV (postoperative nausea and vomiting)    Presence of permanent cardiac pacemaker    Restless legs    Spinal headache    Thyroid nodule    Past Surgical History:  Procedure Laterality Date   APPENDECTOMY     BREAST SURGERY     lumpectomy   COLON SURGERY     DILATION AND CURETTAGE OF UTERUS     EP IMPLANTABLE DEVICE N/A 07/21/2015   Procedure: Pacemaker Implant;  Surgeon: Deboraha Sprang, MD;  Location: [place] [facility];  Service: Cardiovascular;  Laterality: N/A;   LUMBAR LAMINECTOMY/DECOMPRESSION MICRODISCECTOMY N/A 02/02/2016   Procedure: DECOMPRESSION L4-L5 WITH INSITE 2 FUSION ;  Surgeon: Melina Schools, MD;  Location: [place];  Service: Orthopedics;  Laterality: N/A;   PARTIAL NEPHRECTOMY     RIGHT HEART CATH N/A 11/02/2020   Procedure: RIGHT HEART CATH;  Surgeon: Jolaine Artist, MD;  Location: [place] [facility];  Service: Cardiovascular;  Laterality: N/A;   RIGHT/LEFT HEART CATH AND CORONARY ANGIOGRAPHY N/A 05/19/2018   Procedure: RIGHT/LEFT HEART CATH AND CORONARY ANGIOGRAPHY;  Surgeon: Jolaine Artist, MD;  Location: [place] [facility];  Service: Cardiovascular;  Laterality: N/A;   TIBIA IM NAIL INSERTION Right 05/19/2020   Procedure: INTRAMEDULLARY (IM) NAIL TIBIAL;  Surgeon: Nicholes Stairs, MD;  Location: [place];  Service: Orthopedics;  Laterality: Right;   TONSILLECTOMY     TUBAL LIGATION     Current Outpatient Medications  Medication Sig Dispense Refill   ACCU-CHEK AVIVA PLUS test strip      Accu-Chek Softclix Lancets lancets      B-D UF III MINI PEN NEEDLES 31G X 5 MM MISC      Blood Glucose Monitoring Suppl (ACCU-CHEK AVIVA PLUS) w/Device KIT       calcium carbonate (OS-CAL) 600 MG TABS tablet Take 600 mg by mouth daily with breakfast.     Cholecalciferol (VITAMIN D) 2000 UNITS tablet Take 2,000 Units by mouth daily.     Cromolyn Sodium (NASAL ALLERGY NA) Place 1 spray into the nose at bedtime as needed (sleep).     cyclobenzaprine (FLEXERIL) 10 MG tablet Take 10 mg by mouth 3 (three) times daily as needed for muscle spasms.     diclofenac Sodium (VOLTAREN) 1 % GEL Apply 1 application topically at bedtime as needed (sleep).     diphenhydrAMINE (BENADRYL) 25 MG tablet Take 25 mg by mouth daily as needed for itching or allergies.     DULoxetine (CYMBALTA) 20 MG capsule  Take 20 mg by mouth daily.     empagliflozin (JARDIANCE) 10 MG TABS tablet Take 1 tablet (10 mg total) by mouth daily before breakfast. 90 tablet 3   gabapentin (NEURONTIN) 300 MG capsule Take 300 mg by mouth at bedtime.      hydrALAZINE (APRESOLINE) 100 MG tablet Take 1 tablet (100 mg total) by mouth 3 (three) times daily. 90 tablet 6   insulin degludec (TRESIBA FLEXTOUCH) 200 UNIT/ML FlexTouch Pen Inject 70 Units into the skin daily.     labetalol (NORMODYNE) 200 MG tablet Take 1 tablet (200 mg total) by mouth 2 (two) times daily. 60 tablet 5   potassium chloride SA (KLOR-CON M) 20 MEQ tablet Take 2 tablets (40 mEq total) by mouth daily. 180 tablet 3   pramipexole (MIRAPEX) 0.125 MG tablet Take 0.25 mg by mouth 2 (two) times daily.     rosuvastatin (CRESTOR) 10 MG tablet Take 10 mg by mouth daily.      torsemide (DEMADEX) 20 MG tablet Take 1 tablet (20 mg total) by mouth every other day, take 2 tablets (40 mg total) by mouth on alternating days. 45 tablet 1   traMADol (ULTRAM) 50 MG tablet Take 2 tablets by mouth daily as needed.     No current facility-administered medications for this visit.   Allergies:   Penicillins, Codeine, Adhesive [tape], Aspirin, Crab (diagnostic), Iodine, Ivp dye [iodinated contrast media], and Sulfa antibiotics   Social History:  The patient   reports that she quit smoking about 38 years ago. Her smoking use included cigarettes. She started smoking about 68 years ago. She has never used smokeless tobacco. She reports current alcohol use. She reports that she does not use drugs.   Family History:  The patient's family history includes CAD in an other family member; Sick sinus syndrome in her brother.   ROS:  Please see the history of present illness.   All other systems are personally reviewed and negative.   Recent Labs: 05/05/2021: Hemoglobin 12.5; Platelets 154 05/08/2021: NT-Pro BNP 596 06/07/2021: B Natriuretic Peptide 26.2 08/15/2021: BUN 53; Creatinine, Ser 2.07; Potassium 3.6; Sodium 136  Personally reviewed   Wt Readings from Last 3 Encounters:  07/12/21 95.9 kg (211 lb 6.4 oz)  06/07/21 98.3 kg (216 lb 12.8 oz)  05/05/21 98.9 kg (218 lb)    There were no vitals taken for this visit.  Exam:   General:  NAD. No resp difficulty HEENT: Normal Neck: Supple. No JVD. Carotids 2+ bilat; no bruits. No lymphadenopathy or thryomegaly appreciated. Cor: PMI nondisplaced. Regular rate & rhythm. No rubs, gallops, 2/6 AS Lungs: Clear Abdomen: Obese,  nontender, nondistended. No hepatosplenomegaly. No bruits or masses. Good bowel sounds. Extremities: No cyanosis, clubbing, rash, 2+ BLE pre-tibial edema Neuro: Alert & oriented x 3, cranial nerves grossly intact. Moves all 4 extremities w/o difficulty. Affect pleasant.  Device Interrogation (personally reviewed): Daily activity 0.6 hr, no recent AF  ReDs: 30%  ASSESSMENT AND PLAN:  1. Chronic Diastolic Heart Failure  - Echo 05/14/18: EF 50-55%, posterior basal and inferolateral HK, mild LVH, LA mildly dilated - R/LHC 05/19/18: normal coronaries, elevated filling pressure (had held diuretics x2 days), severe HTN. - RHC (2/22): Mild to moderate elevated filling pressures, mild pulm venous HTN, high CO with no shunting. - Echo (4/22): EF 60-65%, RV ok, mild AS mean gradient 21.0 mmHg -  Stable NYHA II-III, suspect she is limited more by body habitus and general deconditioning. - Volume looks OK, LEE appears more  consistent with venous insufficiency, ReDs 30%. Weight down 5 lbs.  - Needs better compliance w/ compression hose. She is agreeable to this. May need UNNA boots, she will notify clinic if swelling does not improve. - Continue torsemide 40 mg daily alternating with 20 mg every other day. - Continue Jardiance 10 mg daily. - No ARB with elevated SCr. - No spiro with previous AKI and hyperkalemia. - BMET today.  2. HTN, severe - Blood pressure controlled.  - Continue current regimen.  3. OSA  - Severe, AHI 49 by PSG in 10/19 - f/u testing AHI 11 - Had televisit with Dr. Radford Pax on 09/30/19. She refused CPAP due to claustrophobia.  - Continues to refuse.    4. Dyspnea - LHC 05/19/18 with normal coronaries. - PFTs with DLCO 70%. - RHC (2/22): Mild pulmonary venous hypertension - Likely mostly due to obesity + OSA; seems less bothersome to her today. - Stressed need again for weight loss with low-carb diet.    5. CKD Stage IIIb-IV - Followed by Dr. Justin Mend - Baseline SCr 1.8 - On SGLT2i for now - Labs today.   6. Sinus node dysfunction s/p MDT PPM - Follows with Dr Caryl Comes.   7. DM - Continue Jardiance.  8. Severe obesity - Again stressed need for more activity and [employer]. - There is no height or weight on file to calculate BMI.   Follow up with Dr. Haroldine Laws as scheduled.   [place], FNP  09/27/2021 9:15 AM  [facility] [place] [address] [facility] and [place] [address] [phone] (office) [phone] (fax)

## 2021-09-28 DIAGNOSIS — N184 Chronic kidney disease, stage 4 (severe): Secondary | ICD-10-CM | POA: Diagnosis not present

## 2021-09-28 DIAGNOSIS — U071 COVID-19: Secondary | ICD-10-CM | POA: Diagnosis not present

## 2021-09-29 ENCOUNTER — Inpatient Hospital Stay (HOSPITAL_COMMUNITY)
Admission: RE | Admit: 2021-09-29 | Discharge: 2021-09-29 | Disposition: A | Discharge status: Home / Self Care | Payer: Medicare PPO | Source: Ambulatory Visit

## 2021-10-18 ENCOUNTER — Ambulatory Visit: Payer: Medicare Other

## 2021-10-19 ENCOUNTER — Telehealth (HOSPITAL_COMMUNITY): Payer: Self-pay

## 2021-10-19 NOTE — Telephone Encounter (Signed)
Called to confirm/remind patient of their appointment at the [facility] on 10/20/21.   Patient reminded to bring all medications and/or complete list.  Confirmed patient has transportation. Gave directions, instructed to utilize [place] parking.  Confirmed appointment prior to ending call.

## 2021-10-20 ENCOUNTER — Encounter (HOSPITAL_COMMUNITY): Payer: Self-pay

## 2021-10-20 ENCOUNTER — Ambulatory Visit (INDEPENDENT_AMBULATORY_CARE_PROVIDER_SITE_OTHER): Payer: Medicare Other

## 2021-10-20 ENCOUNTER — Ambulatory Visit (HOSPITAL_COMMUNITY)
Admission: RE | Admit: 2021-10-20 | Discharge: 2021-10-20 | Disposition: A | Discharge status: Home / Self Care | Payer: Medicare Other | Source: Ambulatory Visit | Attending: Family Medicine | Admitting: Family Medicine

## 2021-10-20 ENCOUNTER — Other Ambulatory Visit: Payer: Self-pay

## 2021-10-20 VITALS — BP 144/60 | HR 76 | Wt 212.4 lb

## 2021-10-20 DIAGNOSIS — G4733 Obstructive sleep apnea (adult) (pediatric): Secondary | ICD-10-CM | POA: Insufficient documentation

## 2021-10-20 DIAGNOSIS — E1122 Type 2 diabetes mellitus with diabetic chronic kidney disease: Secondary | ICD-10-CM | POA: Diagnosis not present

## 2021-10-20 DIAGNOSIS — I5032 Chronic diastolic (congestive) heart failure: Secondary | ICD-10-CM | POA: Diagnosis not present

## 2021-10-20 DIAGNOSIS — I10 Essential (primary) hypertension: Secondary | ICD-10-CM | POA: Diagnosis not present

## 2021-10-20 DIAGNOSIS — Z7901 Long term (current) use of anticoagulants: Secondary | ICD-10-CM | POA: Diagnosis not present

## 2021-10-20 DIAGNOSIS — F40240 Claustrophobia: Secondary | ICD-10-CM | POA: Insufficient documentation

## 2021-10-20 DIAGNOSIS — N1832 Chronic kidney disease, stage 3b: Secondary | ICD-10-CM | POA: Diagnosis not present

## 2021-10-20 DIAGNOSIS — M7989 Other specified soft tissue disorders: Secondary | ICD-10-CM | POA: Diagnosis not present

## 2021-10-20 DIAGNOSIS — N184 Chronic kidney disease, stage 4 (severe): Secondary | ICD-10-CM | POA: Diagnosis not present

## 2021-10-20 DIAGNOSIS — I350 Nonrheumatic aortic (valve) stenosis: Secondary | ICD-10-CM | POA: Diagnosis not present

## 2021-10-20 DIAGNOSIS — I130 Hypertensive heart and chronic kidney disease with heart failure and stage 1 through stage 4 chronic kidney disease, or unspecified chronic kidney disease: Secondary | ICD-10-CM | POA: Diagnosis not present

## 2021-10-20 DIAGNOSIS — Z7984 Long term (current) use of oral hypoglycemic drugs: Secondary | ICD-10-CM | POA: Diagnosis not present

## 2021-10-20 DIAGNOSIS — Z794 Long term (current) use of insulin: Secondary | ICD-10-CM | POA: Diagnosis not present

## 2021-10-20 DIAGNOSIS — I272 Pulmonary hypertension, unspecified: Secondary | ICD-10-CM | POA: Insufficient documentation

## 2021-10-20 DIAGNOSIS — M549 Dorsalgia, unspecified: Secondary | ICD-10-CM | POA: Diagnosis not present

## 2021-10-20 DIAGNOSIS — K219 Gastro-esophageal reflux disease without esophagitis: Secondary | ICD-10-CM | POA: Diagnosis not present

## 2021-10-20 DIAGNOSIS — Z6837 Body mass index (BMI) 37.0-37.9, adult: Secondary | ICD-10-CM | POA: Insufficient documentation

## 2021-10-20 DIAGNOSIS — I495 Sick sinus syndrome: Secondary | ICD-10-CM

## 2021-10-20 DIAGNOSIS — E669 Obesity, unspecified: Secondary | ICD-10-CM | POA: Insufficient documentation

## 2021-10-20 DIAGNOSIS — I5022 Chronic systolic (congestive) heart failure: Secondary | ICD-10-CM

## 2021-10-20 DIAGNOSIS — E1136 Type 2 diabetes mellitus with diabetic cataract: Secondary | ICD-10-CM | POA: Insufficient documentation

## 2021-10-20 DIAGNOSIS — Z79899 Other long term (current) drug therapy: Secondary | ICD-10-CM | POA: Insufficient documentation

## 2021-10-20 DIAGNOSIS — R0602 Shortness of breath: Secondary | ICD-10-CM | POA: Insufficient documentation

## 2021-10-20 DIAGNOSIS — R0600 Dyspnea, unspecified: Secondary | ICD-10-CM | POA: Diagnosis not present

## 2021-10-20 LAB — BASIC METABOLIC PANEL
Anion gap: 10 (ref 5–15)
BUN: 47 mg/dL — ABNORMAL HIGH (ref 8–23)
CO2: 27 mmol/L (ref 22–32)
Calcium: 9 mg/dL (ref 8.9–10.3)
Chloride: 101 mmol/L (ref 98–111)
Creatinine, Ser: 1.75 mg/dL — ABNORMAL HIGH (ref 0.44–1.00)
GFR, Estimated: 29 mL/min — ABNORMAL LOW (ref >60)
Glucose, Bld: 119 mg/dL — ABNORMAL HIGH (ref 70–99)
Potassium: 3.8 mmol/L (ref 3.5–5.1)
Sodium: 138 mmol/L (ref 135–145)

## 2021-10-20 LAB — BRAIN NATRIURETIC PEPTIDE: B Natriuretic Peptide: 43.9 pg/mL (ref 0.0–100.0)

## 2021-10-20 MED ORDER — AMLODIPINE BESYLATE 5 MG PO TABS: 5.0000 mg | ORAL_TABLET | Freq: Every day | ORAL | 3 refills | Status: DC

## 2021-10-20 MED ORDER — TORSEMIDE 20 MG PO TABS: 40.0000 mg | ORAL_TABLET | Freq: Every day | ORAL | 11 refills | Status: DC

## 2021-10-20 NOTE — Progress Notes (Signed)
Advanced Heart Failure Clinic Note   Date:  10/20/2021   ID:  [name], DOB [birth year], MRN [id number]  Location: Home  Provider location: [facility] Type of Visit: Established patient  PCP:  Caren Macadam, MD  Cardiologist:  Fransico Him, MD Nephrology: Dr. Justin Mend Primary HF: Bensimhon  Chief Complaint: Heart Failure follow-up   HPI: [name] is a 81 y.o.Marland Kitchen female with history of diastolic heart failure, DM, GERD, colon cancer, CKD stage III, 2016 pacemaker (MDT) due to symptomatic sinus node dysfunction.  In 7/19, she had 12 pound weight and she was sent to the ED by her PCP. She was sent home on metolazone every other day for 14 days. She took 4 doses and her creatinine went up to 3 so metolazone was stopped.   R/L cath in 9/19. Normal cors. Elevated filling pressures with pulmonary venous HTN.   Had fall with R tibia fracture in 9/21    Having more dyspnea at follow up 2/22, arranged for RHC and repeat echo.  RHC (2/22) showed mild to moderate elevated filling pressures with prominent v waves in PCWP tracing, mild pulmonary HTN and high CO with no evidence of intracardiac shunting. Echo (4/22) showed EF 60-65%, RV ok, mild AS.  SCr remained elevated on lab check at EP visit 8/22 and irbesartan stopped.  Today she returns for HF follow up. Last follow up 11/22 she had NYHA III symptoms, volume was ok. She missed her follow up lat month. She has days where she is more SOB, back pain continues to cause significant limitations in her mobility. She is SOB with minimal activity and has LE swelling, for which she is wearing compression hose. Denies palpitations, abnormal bleeding, CP, or dizziness. Chronically sleeps reclined. Appetite ok. No fever or chills. Weight at home 205 pounds. Taking all medications. Checks BP some at home, but does not remember readings.   Cardiac Studies: - Echo (4/22): EF 60-65%, RV ok, mild AS mean gradient 21.0  mmHg  - RHC (2/22):  RA = 9 RV = 49/13 PA = 49/17 (33) PCW = 22 ( v waves 28) Fick cardiac output/index = 8.0/3.9 PVR = 1.6 WU FA sat = 95% PA sat = 74%, 73% High SVC sat = 69%  Assessment: 1. Mild to moderately elevated filling pressures with prominent v waves in PCWP tracing suggestive of diastolic dysfunction versus mitral regurgitation 2. Mild pulmonary venous HTN 3. High cardiac output with no evidence of intracardiac shunting  - PFTs 05/23/18: FEV1: 1.72 FEV1/FVC ratio: 77% DLCO: 70%  - R/LHC 05/19/18: Ao = 192/74 (118) LV = 201/26 RA = 14 RV = 53/18 PA = 53/19 (38) PCW = 27 (v = 41) Fick cardiac output/index = 5.6/2.8 PVR = 2.0 WU Ao sat = 95% PA sat = 64%, 66% Assessment: 1. Normal coronaries with ectactic vessels suggestive of longstanding HTN 2. Severe HTN 3. Normal LV function 4. Signficantly elevated filling pressures with pulmonary venous HTN in setting of holding diuretics for 2 days Plan/Discussion: Filling pressures and BP elevated. Will resume diuretics. Will need aggressive titration of ant-HTN regimen.  - Echo 05/14/18: - Left ventricle: Posterior basal and inferolateral hypokinesis The   cavity size was moderately dilated. Wall thickness was increased   in a pattern of mild LVH. Systolic function was normal. The   estimated ejection fraction was in the range of 50% to 55%. Left   ventricular diastolic function parameters were normal. - Aortic valve:  Sclerosis without stenosis. - Mitral valve: Moderately calcified annulus. Moderately thickened,   mildly calcified leaflets . - Left atrium: The atrium was mildly dilated. - Atrial septum: No defect or patent foramen ovale was identified.   -Sleep study 10/19: AHI 49  - 06/21/2015 Myoview  Nuclear stress EF: 64%. There was no ST segment deviation noted during stress. The study is normal. This is a low risk study. No ischemia identified.  SH: Former 30-40 pack year smoker, quit 25 years ago.    Past Medical History:  Diagnosis Date   Anxiety    Arthritis    Cancer ([place])    colon   CHF (congestive heart failure) (HCC)    Chronic kidney disease    stage 3   Chronic pain syndrome    Diabetes (HCC)    Diabetic neuropathy (HCC)    Dyslipidemia    Early cataracts, bilateral    Fibromyalgia    GERD (gastroesophageal reflux disease)    H/O syncope    Heart murmur    Hypertension    Lumbar spinal stenosis    and scoliosis   Pneumonia    PONV (postoperative nausea and vomiting)    Presence of permanent cardiac pacemaker    Restless legs    Spinal headache    Thyroid nodule    Past Surgical History:  Procedure Laterality Date   APPENDECTOMY     BREAST SURGERY     lumpectomy   COLON SURGERY     DILATION AND CURETTAGE OF UTERUS     EP IMPLANTABLE DEVICE N/A 07/21/2015   Procedure: Pacemaker Implant;  Surgeon: Deboraha Sprang, MD;  Location: [place] [place];  Service: Cardiovascular;  Laterality: N/A;   LUMBAR LAMINECTOMY/DECOMPRESSION MICRODISCECTOMY N/A 02/02/2016   Procedure: DECOMPRESSION L4-L5 WITH INSITE 2 FUSION ;  Surgeon: Melina Schools, MD;  Location: [facility];  Service: Orthopedics;  Laterality: N/A;   PARTIAL NEPHRECTOMY     RIGHT HEART CATH N/A 11/02/2020   Procedure: RIGHT HEART CATH;  Surgeon: Jolaine Artist, MD;  Location: [place] [facility];  Service: Cardiovascular;  Laterality: N/A;   RIGHT/LEFT HEART CATH AND CORONARY ANGIOGRAPHY N/A 05/19/2018   Procedure: RIGHT/LEFT HEART CATH AND CORONARY ANGIOGRAPHY;  Surgeon: Jolaine Artist, MD;  Location: [place] [facility];  Service: Cardiovascular;  Laterality: N/A;   TIBIA IM NAIL INSERTION Right 05/19/2020   Procedure: INTRAMEDULLARY (IM) NAIL TIBIAL;  Surgeon: Nicholes Stairs, MD;  Location: [place];  Service: Orthopedics;  Laterality: Right;   TONSILLECTOMY     TUBAL LIGATION     Current Outpatient Medications  Medication Sig Dispense Refill   ACCU-CHEK AVIVA PLUS test strip      Accu-Chek  Softclix Lancets lancets      B-D UF III MINI PEN NEEDLES 31G X 5 MM MISC      Blood Glucose Monitoring Suppl (ACCU-CHEK AVIVA PLUS) w/Device KIT      calcium carbonate (OS-CAL) 600 MG TABS tablet Take 600 mg by mouth daily with breakfast.     Cholecalciferol (VITAMIN D) 2000 UNITS tablet Take 2,000 Units by mouth daily.     Cromolyn Sodium (NASAL ALLERGY NA) Place 1 spray into the nose at bedtime as needed (sleep).     cyclobenzaprine (FLEXERIL) 10 MG tablet Take 10 mg by mouth 3 (three) times daily as needed for muscle spasms.     diclofenac Sodium (VOLTAREN) 1 % GEL Apply 1 application topically at bedtime as needed (sleep).  diphenhydrAMINE (BENADRYL) 25 MG tablet Take 25 mg by mouth daily as needed for itching or allergies.     DULoxetine (CYMBALTA) 20 MG capsule Take 20 mg by mouth daily.     empagliflozin (JARDIANCE) 10 MG TABS tablet Take 1 tablet (10 mg total) by mouth daily before breakfast. 90 tablet 3   hydrALAZINE (APRESOLINE) 100 MG tablet Take 1 tablet (100 mg total) by mouth 3 (three) times daily. 90 tablet 6   insulin degludec (TRESIBA FLEXTOUCH) 200 UNIT/ML FlexTouch Pen Inject 75 Units into the skin daily.     labetalol (NORMODYNE) 200 MG tablet Take 1 tablet (200 mg total) by mouth 2 (two) times daily. 60 tablet 5   potassium chloride SA (KLOR-CON M) 20 MEQ tablet Take 2 tablets (40 mEq total) by mouth daily. (Patient taking differently: Take 20 mEq by mouth daily.) 180 tablet 3   pramipexole (MIRAPEX) 0.125 MG tablet Take 0.25 mg by mouth 2 (two) times daily.     rosuvastatin (CRESTOR) 10 MG tablet Take 10 mg by mouth daily.      torsemide (DEMADEX) 20 MG tablet Take 1 tablet (20 mg total) by mouth every other day, take 2 tablets (40 mg total) by mouth on alternating days. 45 tablet 1   traMADol (ULTRAM) 50 MG tablet Take 2 tablets by mouth daily as needed.     gabapentin (NEURONTIN) 300 MG capsule Take 300 mg by mouth at bedtime.  (Patient not taking: Reported on  10/20/2021)     No current facility-administered medications for this encounter.   Allergies:   Penicillins, Codeine, Adhesive [tape], Aspirin, Crab (diagnostic), Iodine, Ivp dye [iodinated contrast media], and Sulfa antibiotics   Social History:  The patient  reports that she quit smoking about 38 years ago. Her smoking use included cigarettes. She started smoking about 68 years ago. She has never used smokeless tobacco. She reports current alcohol use. She reports that she does not use drugs.   Family History:  The patient's family history includes CAD in an other family member; Sick sinus syndrome in her brother.   ROS:  Please see the history of present illness.   All other systems are personally reviewed and negative.   Recent Labs: 05/05/2021: Hemoglobin 12.5; Platelets 154 05/08/2021: NT-Pro BNP 596 06/07/2021: B Natriuretic Peptide 26.2 08/15/2021: BUN 53; Creatinine, Ser 2.07; Potassium 3.6; Sodium 136  Personally reviewed   Wt Readings from Last 3 Encounters:  10/20/21 96.3 kg (212 lb 6.4 oz)  07/12/21 95.9 kg (211 lb 6.4 oz)  06/07/21 98.3 kg (216 lb 12.8 oz)    BP (!) 144/60 (BP Location: Right Arm, Patient Position: Sitting)    Pulse 76    Wt 96.3 kg (212 lb 6.4 oz)    SpO2 94%    BMI 37.62 kg/m   Physical Exam:   General:  NAD. No resp difficulty, walked into clinic HEENT: Normal Neck: Supple. thick neck. Carotids 2+ bilat; no bruits. No lymphadenopathy or thryomegaly appreciated. Cor: PMI nondisplaced. Regular rate & rhythm. No rubs, gallops, 2/6 AS Lungs: Clear Abdomen: Obese, nontender, nondistended. No hepatosplenomegaly. No bruits or masses. Good bowel sounds. Extremities: No cyanosis, clubbing, rash; 2+ BLE pre-tibial edema Neuro: Alert & oriented x 3, cranial nerves grossly intact. Moves all 4 extremities w/o difficulty. Affect pleasant.  Device Interrogation (personally reviewed): Daily activity 1 hr, no recent AF  ECG: NSR, a-paced (personally  reviewed)  ASSESSMENT AND PLAN:  1. Chronic Diastolic Heart Failure  - Echo 05/14/18: EF 50-55%,  posterior basal and inferolateral HK, mild LVH, LA mildly dilated - R/LHC 05/19/18: normal coronaries, elevated filling pressure (had held diuretics x2 days), severe HTN. - RHC (2/22): Mild to moderate elevated filling pressures, mild pulm venous HTN, high CO with no shunting. - Echo (4/22): EF 60-65%, RV ok, mild AS mean gradient 21.0 mmHg - Stable NYHA III, suspect she is limited more by body habitus and general deconditioning. - Volume looks mildly up, however LEE appears more consistent with venous insufficiency. - Continue w/ compression hose.  - Increase torsemide to 40 mg. - Continue Jardiance 10 mg daily. - No ARB/ARNi/spiro with SCr >2. - BMET today. - Update echo.  2. HTN, severe - Elevated  - Start amlodipine 5 mg daily.  3. Aortic Stenosis - mild by echo 4/22, mean gradient 21 - update echo  4. OSA  - Severe, AHI 49 by PSG in 10/19 - f/u testing AHI 11 - Had televisit with Dr. Radford Pax on 09/30/19. She refused CPAP due to claustrophobia.  - Continues to refuse.    5. Dyspnea - LHC 05/19/18 with normal coronaries. - PFTs with DLCO 70%. - RHC (2/22): Mild pulmonary venous hypertension - Likely mostly due to obesity + OSA.. - Needs weight loss + CPAP   6. CKD Stage IV - Followed by Dr. Justin Mend - Baseline SCr 1.8 - On SGLT2i for now - Labs today.   7. Sinus node dysfunction s/p MDT PPM - Follows with Dr Caryl Comes.   8. DM - Continue Jardiance. - No GU symptoms.  9. Obesity - Needs weight loss. Could consider referral to pharmacy for semaglutide. - Body mass index is 37.62 kg/m.   Follow up with APP in 4 weeks (BP check) and in 12 weeks with Dr. Haroldine Laws + echo.   [place], FNP  10/20/2021 1:34 PM  [facility] [place] [address] [facility] and [place] [address] [phone] (office) [phone]  (fax)

## 2021-10-20 NOTE — Patient Instructions (Addendum)
START Amlodipine 5mg  (1 tab) daily  INCREASE Torsemide to 40mg  (2 tabs) daily  Labs today and repeat in 10 days We will only contact you if something comes back abnormal or we need to make some changes. Otherwise no news is good news!  Your physician recommends that you schedule a follow-up appointment in: 4 weeks with NP and 3 months with Dr Haroldine Laws and an ECHO  Please call office at [phone] option [phone] if you have any questions or concerns.   At the [facility], you and your health needs are our priority. As part of our continuing mission to provide you with exceptional heart care, we have created designated Provider Care Teams. These Care Teams include your primary Cardiologist (physician) and Advanced Practice Providers (APPs- Physician Assistants and Nurse Practitioners) who all work together to provide you with the care you need, when you need it.   You may see any of the following providers on your designated Care Team at your next follow up: Dr Glori Bickers Dr Haynes Kerns, NP Lyda Jester, [place] [facility] [place], [place] Audry Riles, PharmD   Please be sure to bring in all your medications bottles to every appointment.

## 2021-10-21 LAB — CUP PACEART REMOTE DEVICE CHECK
Battery Remaining Longevity: 54 mo
Battery Voltage: 2.99 V
Brady Statistic AP VP Percent: 0.15 %
Brady Statistic AP VS Percent: 99.14 %
Brady Statistic AS VP Percent: 0 %
Brady Statistic AS VS Percent: 0.71 %
Brady Statistic RA Percent Paced: 99.26 %
Brady Statistic RV Percent Paced: 0.15 %
Date Time Interrogation Session: 20230210144028
Implantable Lead Implant Date: 20161110
Implantable Lead Implant Date: 20161110
Implantable Lead Location: 753859
Implantable Lead Location: 753860
Implantable Lead Model: 5076
Implantable Lead Model: 5076
Implantable Pulse Generator Implant Date: 20161110
Lead Channel Impedance Value: 361 Ohm
Lead Channel Impedance Value: 380 Ohm
Lead Channel Impedance Value: 399 Ohm
Lead Channel Impedance Value: 437 Ohm
Lead Channel Pacing Threshold Amplitude: 0.625 V
Lead Channel Pacing Threshold Amplitude: 1.625 V
Lead Channel Pacing Threshold Pulse Width: 0.4 ms
Lead Channel Pacing Threshold Pulse Width: 0.4 ms
Lead Channel Sensing Intrinsic Amplitude: 1.875 mV
Lead Channel Sensing Intrinsic Amplitude: 1.875 mV
Lead Channel Sensing Intrinsic Amplitude: 4.875 mV
Lead Channel Sensing Intrinsic Amplitude: 4.875 mV
Lead Channel Setting Pacing Amplitude: 1.5 V
Lead Channel Setting Pacing Amplitude: 3.25 V
Lead Channel Setting Pacing Pulse Width: 0.4 ms
Lead Channel Setting Sensing Sensitivity: 2.8 mV

## 2021-10-24 NOTE — Progress Notes (Signed)
Remote pacemaker transmission.   

## 2021-10-27 ENCOUNTER — Ambulatory Visit: Payer: Medicare Other

## 2021-10-31 ENCOUNTER — Other Ambulatory Visit: Payer: Self-pay

## 2021-10-31 ENCOUNTER — Ambulatory Visit (HOSPITAL_COMMUNITY)
Admission: RE | Admit: 2021-10-31 | Discharge: 2021-10-31 | Disposition: A | Discharge status: Home / Self Care | Payer: Medicare Other | Source: Ambulatory Visit | Attending: Internal Medicine | Admitting: Internal Medicine

## 2021-10-31 DIAGNOSIS — I5022 Chronic systolic (congestive) heart failure: Secondary | ICD-10-CM | POA: Diagnosis not present

## 2021-10-31 LAB — BASIC METABOLIC PANEL
Anion gap: 13 (ref 5–15)
BUN: 56 mg/dL — ABNORMAL HIGH (ref 8–23)
CO2: 26 mmol/L (ref 22–32)
Calcium: 9.1 mg/dL (ref 8.9–10.3)
Chloride: 98 mmol/L (ref 98–111)
Creatinine, Ser: 2.01 mg/dL — ABNORMAL HIGH (ref 0.44–1.00)
GFR, Estimated: 24 mL/min — ABNORMAL LOW (ref >60)
Glucose, Bld: 275 mg/dL — ABNORMAL HIGH (ref 70–99)
Potassium: 4 mmol/L (ref 3.5–5.1)
Sodium: 137 mmol/L (ref 135–145)

## 2021-11-01 DIAGNOSIS — L249 Irritant contact dermatitis, unspecified cause: Secondary | ICD-10-CM | POA: Diagnosis not present

## 2021-11-01 DIAGNOSIS — I872 Venous insufficiency (chronic) (peripheral): Secondary | ICD-10-CM | POA: Diagnosis not present

## 2021-11-02 ENCOUNTER — Ambulatory Visit: Payer: Medicare Other

## 2021-11-02 ENCOUNTER — Other Ambulatory Visit: Payer: Self-pay

## 2021-11-02 DIAGNOSIS — M14679 Charcot's joint, unspecified ankle and foot: Secondary | ICD-10-CM

## 2021-11-02 DIAGNOSIS — E1140 Type 2 diabetes mellitus with diabetic neuropathy, unspecified: Secondary | ICD-10-CM

## 2021-11-02 NOTE — Progress Notes (Signed)
SITUATION Reason for Consult: Follow-up with diabetic shoes and insoles Patient / Caregiver Report: Patient is satisfied with shoes and insoles as fit  OBJECTIVE DATA History / Diagnosis:    ICD-10-CM   1. Diabetic neuropathy with neurologic complication (HCC)  V33.17    E11.49     2. Charcot's joint of ankle, unspecified laterality  M14.679       Change in Pathology: None  ACTIONS PERFORMED Patient's equipment was checked for structural stability and fit. Fit [id number] [employer]. Patient is satisfied with fit and function and with custom orthotics. Device(s) intact and fit is excellent. All questions answered and concerns addressed.  PLAN Follow-up as needed (PRN). Plan of care discussed with and agreed upon by patient / caregiver.

## 2021-11-03 ENCOUNTER — Other Ambulatory Visit (HOSPITAL_COMMUNITY): Payer: Self-pay | Admitting: Family Medicine

## 2021-11-15 NOTE — Progress Notes (Signed)
Advanced Heart Failure Clinic Note   Date:  11/17/2021   ID:  [name], DOB Nov 04, 1940, MRN [id number]  Location: Home  Provider location: [facility] Type of Visit: Established patient  PCP:  Caren Macadam, MD  Cardiologist:  Fransico Him, MD Nephrology: Dr. Justin Mend Primary HF: B[place]hon  Chief Complaint: Heart Failure follow-up   HPI: [name] is a 81 y.o.Marland Kitchen female with history of diastolic heart failure, DM, GERD, colon cancer, CKD stage III, 2016 pacemaker (MDT) due to symptomatic sinus node dysfunction.  In 7/19, she had 12 pound weight and she was sent to the ED by her PCP. She was sent home on metolazone every other day for 14 days. She took 4 doses and her creatinine went up to 3 so metolazone was stopped.   R/L cath in 9/19. Normal cors. Elevated filling pressures with pulmonary venous HTN.   Had fall with R tibia fracture in 9/21    Having more dyspnea at follow up 2/22, arranged for RHC and repeat echo.  RHC (2/22) showed mild to moderate elevated filling pressures with prominent v waves in PCWP tracing, mild pulmonary HTN and high CO with no evidence of intracardiac shunting. Echo (4/22) showed EF 60-65%, RV ok, mild AS.  SCr remained elevated on lab check at EP visit 8/22 and irbesartan stopped.  Follow up 11/22 she had NYHA III symptoms, volume was ok. Volume mildly up at 2/23 visit, torsemide increased to 40 daily, echo ordered.  Today she returns for HF follow up. She is SOB walking around the house and with ADLs, back pain continues to cause significant limitations in her mobility. Unable to do housework. She has a daughter who helps her some. Continues to have LE swelling, did not wear compression hose today. Chronically sleeps in recliner.  Denies CP, dizziness, palpitations, or PND/Orthopnea. Appetite ok. No fever or chills. Weight at home 205 pounds. Taking all medications.    Cardiac Studies: - Echo (4/22): EF 60-65%,  RV ok, mild AS mean gradient 21.0 mmHg  - RHC (2/22):  RA = 9 RV = 49/13 PA = 49/17 (33) PCW = 22 ( v waves 28) Fick cardiac output/index = 8.0/3.9 PVR = 1.6 WU FA sat = 95% PA sat = 74%, 73% High SVC sat = 69%  Assessment: 1. Mild to moderately elevated filling pressures with prominent v waves in PCWP tracing suggestive of diastolic dysfunction versus mitral regurgitation 2. Mild pulmonary venous HTN 3. High cardiac output with no evidence of intracardiac shunting  - PFTs 05/23/18: FEV1: 1.72 FEV1/FVC ratio: 77% DLCO: 70%  - R/LHC 05/19/18: Ao = 192/74 (118) LV = 201/26 RA = 14 RV = 53/18 PA = 53/19 (38) PCW = 27 (v = 41) Fick cardiac output/index = 5.6/2.8 PVR = 2.0 WU Ao sat = 95% PA sat = 64%, 66% Assessment: 1. Normal coronaries with ectactic vessels suggestive of longstanding HTN 2. Severe HTN 3. Normal LV function 4. Signficantly elevated filling pressures with pulmonary venous HTN in setting of holding diuretics for 2 days Plan/Discussion: Filling pressures and BP elevated. Will resume diuretics. Will need aggressive titration of ant-HTN regimen.  - Echo 05/14/18: - Left ventricle: Posterior basal and inferolateral hypokinesis The   cavity size was moderately dilated. Wall thickness was increased   in a pattern of mild LVH. Systolic function was normal. The   estimated ejection fraction was in the range of 50% to 55%. Left   ventricular diastolic function  parameters were normal. - Aortic valve: Sclerosis without stenosis. - Mitral valve: Moderately calcified annulus. Moderately thickened,   mildly calcified leaflets . - Left atrium: The atrium was mildly dilated. - Atrial septum: No defect or patent foramen ovale was identified.   -Sleep study 10/19: AHI 49  - 06/21/2015 Myoview  Nuclear stress EF: 64%. There was no ST segment deviation noted during stress. The study is normal. This is a low risk study. No ischemia identified.  SH: Former 30-40 pack  year smoker, quit 25 years ago.   Past Medical History:  Diagnosis Date   Anxiety    Arthritis    Cancer ([place])    colon   CHF (congestive heart failure) (HCC)    Chronic kidney disease    stage 3   Chronic pain syndrome    Diabetes (HCC)    Diabetic neuropathy (HCC)    Dyslipidemia    Early cataracts, bilateral    Fibromyalgia    GERD (gastroesophageal reflux disease)    H/O syncope    Heart murmur    Hypertension    Lumbar spinal stenosis    and scoliosis   Pneumonia    PONV (postoperative nausea and vomiting)    Presence of permanent cardiac pacemaker    Restless legs    Spinal headache    Thyroid nodule    Past Surgical History:  Procedure Laterality Date   APPENDECTOMY     BREAST SURGERY     lumpectomy   COLON SURGERY     DILATION AND CURETTAGE OF UTERUS     EP IMPLANTABLE DEVICE N/A 07/21/2015   Procedure: Pacemaker Implant;  Surgeon: Deboraha Sprang, MD;  Location: [place];  Service: Cardiovascular;  Laterality: N/A;   LUMBAR LAMINECTOMY/DECOMPRESSION MICRODISCECTOMY N/A 02/02/2016   Procedure: DECOMPRESSION L4-L5 WITH INSITE 2 FUSION ;  Surgeon: Melina Schools, MD;  Location: [facility];  Service: Orthopedics;  Laterality: N/A;   PARTIAL NEPHRECTOMY     RIGHT HEART CATH N/A 11/02/2020   Procedure: RIGHT HEART CATH;  Surgeon: Jolaine Artist, MD;  Location: [facility];  Service: Cardiovascular;  Laterality: N/A;   RIGHT/LEFT HEART CATH AND CORONARY ANGIOGRAPHY N/A 05/19/2018   Procedure: RIGHT/LEFT HEART CATH AND CORONARY ANGIOGRAPHY;  Surgeon: Jolaine Artist, MD;  Location: [place];  Service: Cardiovascular;  Laterality: N/A;   TIBIA IM NAIL INSERTION Right 05/19/2020   Procedure: INTRAMEDULLARY (IM) NAIL TIBIAL;  Surgeon: Nicholes Stairs, MD;  Location: [facility];  Service: Orthopedics;  Laterality: Right;   TONSILLECTOMY     TUBAL LIGATION     Current Outpatient Medications  Medication Sig Dispense Refill   ACCU-CHEK AVIVA PLUS  test strip      Accu-Chek Softclix Lancets lancets      amLODipine (NORVASC) 5 MG tablet Take 1 tablet (5 mg total) by mouth daily. 180 tablet 3   B-D UF III MINI PEN NEEDLES 31G X 5 MM MISC      Blood Glucose Monitoring Suppl (ACCU-CHEK AVIVA PLUS) w/Device KIT      calcium carbonate (OS-CAL) 600 MG TABS tablet Patient takes 1 tablet by mouth twice a week.     Cholecalciferol (VITAMIN D) 2000 UNITS tablet Take 2,000 Units by mouth daily.     Cromolyn Sodium (NASAL ALLERGY NA) Place 1 spray into the nose at bedtime as needed (sleep).     cyclobenzaprine (FLEXERIL) 10 MG tablet Take 10 mg by mouth 3 (three) times daily as needed  for muscle spasms.     diclofenac Sodium (VOLTAREN) 1 % GEL Apply 1 application topically at bedtime as needed (sleep).     diphenhydrAMINE (BENADRYL) 25 MG tablet Take 25 mg by mouth daily as needed for itching or allergies.     DULoxetine (CYMBALTA) 20 MG capsule Take 20 mg by mouth daily.     empagliflozin (JARDIANCE) 10 MG TABS tablet Take 1 tablet (10 mg total) by mouth daily before breakfast. 90 tablet 3   gabapentin (NEURONTIN) 300 MG capsule Take 300 mg by mouth at bedtime.     hydrALAZINE (APRESOLINE) 100 MG tablet Take 1 tablet (100 mg total) by mouth 3 (three) times daily. 90 tablet 6   insulin degludec (TRESIBA FLEXTOUCH) 200 UNIT/ML FlexTouch Pen Inject 75 Units into the skin daily.     labetalol (NORMODYNE) 200 MG tablet TAKE ONE TABLET BY MOUTH TWICE A DAY 180 tablet 3   potassium chloride SA (KLOR-CON M) 20 MEQ tablet Take 2 tablets (40 mEq total) by mouth daily. 180 tablet 3   pramipexole (MIRAPEX) 0.125 MG tablet Take 0.25 mg by mouth 2 (two) times daily.     rosuvastatin (CRESTOR) 10 MG tablet Take 10 mg by mouth daily.      torsemide (DEMADEX) 20 MG tablet Take 2 tablets (40 mg total) by mouth daily. 60 tablet 11   traMADol (ULTRAM) 50 MG tablet Take 2 tablets by mouth daily as needed.     No current facility-administered medications for this  encounter.   Allergies:   Penicillins, Codeine, Adhesive [tape], Aspirin, Crab (diagnostic), Iodine, Ivp dye [iodinated contrast media], and Sulfa antibiotics   Social History:  The patient  reports that she quit smoking about 38 years ago. Her smoking use included cigarettes. She started smoking about 68 years ago. She has never used smokeless tobacco. She reports current alcohol use. She reports that she does not use drugs.   Family History:  The patient's family history includes CAD in an other family member; Sick sinus syndrome in her brother.   ROS:  Please see the history of present illness.   All other systems are personally reviewed and negative.   Recent Labs: 05/05/2021: Hemoglobin 12.5; Platelets 154 05/08/2021: NT-Pro BNP 596 10/20/2021: B Natriuretic Peptide 43.9 10/31/2021: BUN 56; Creatinine, Ser 2.01; Potassium 4.0; Sodium 137  Personally reviewed   Wt Readings from Last 3 Encounters:  11/17/21 96.3 kg (212 lb 3.2 oz)  10/20/21 96.3 kg (212 lb 6.4 oz)  07/12/21 95.9 kg (211 lb 6.4 oz)    BP (!) 150/66    Pulse 88    Wt 96.3 kg (212 lb 3.2 oz)    SpO2 96%    BMI 37.59 kg/m   Physical Exam:   General:  NAD. No resp difficulty HEENT: Normal Neck: Supple. Thick neck. Carotids 2+ bilat; no bruits. No lymphadenopathy or thryomegaly appreciated. Cor: PMI nondisplaced. Regular rate & rhythm. No rubs, gallops, 2/6 AS Lungs: Clear Abdomen: Obese, nontender, nondistended. No hepatosplenomegaly. No bruits or masses. Good bowel sounds. Extremities: No cyanosis, clubbing, rash, 2-3+ BLE edema to thighs Neuro: Alert & oriented x 3, cranial nerves grossly intact. Moves all 4 extremities w/o difficulty. Affect pleasant.  Device Interrogation (personally reviewed): 0.9 hr/day activity, no recent AF, no VT.  Assessment & Plan: 1. Acute on Chronic Diastolic Heart Failure  - Echo 05/14/18: EF 50-55%, posterior basal and inferolateral HK, mild LVH, LA mildly dilated - R/LHC 05/19/18: normal  coronaries, elevated filling pressure (had held  diuretics x2 days), severe HTN. - RHC (2/22): Mild to moderate elevated filling pressures, mild pulm venous HTN, high CO with no shunting. - Echo (4/22): EF 60-65%, RV ok, mild AS mean gradient 21.0 mmHg - Stable NYHA III, suspect she is limited body habitus and general deconditioning. - Volume up, she does have LE venous insufficiency, but 2-3+ BLE edema today. - Arrange ABIs and place UNNA boots. Rx given for compression hose if unable to arrange UNNA boots.  - Give metolazone 2.5 mg x 1 + extra 40 KCL for one dose. - Increase torsemide to 40 mg bid x 3 days, then back to 40 mg daily. - Continue Jardiance 10 mg daily. - No ARB/ARNi/spiro with SCr >2. - Update echo next visit. - Recent labs reviewed and look ok, K 4.0, SCr 2.01 - BMET at follow up in 1 week.  2. HTN, severe - Elevated. Diurese as above. - Continue hydralazine 100 mg tid. - Continue labetalol 200 mg bid. - Continue amlodipine 5 mg daily.  3. Aortic Stenosis - Mild by echo 4/22, mean gradient 21 - Update echo  4. OSA  - Severe, AHI 49 by PSG in 10/19 - f/u testing AHI 11 - Had televisit with Dr. Radford Pax on 09/30/19. She refused CPAP due to claustrophobia.  - Continues to refuse.    5. Dyspnea - LHC 05/19/18 with normal coronaries. - PFTs with DLCO 70%. - RHC (2/22): Mild pulmonary venous hypertension - Likely mostly due to obesity + OSA.. - Needs weight loss + CPAP   6. CKD Stage IV - Followed by Dr. Justin Mend - Baseline SCr 1.8-2 - On SGLT2i for now. - Follow labs with diuresis.   7. Sinus node dysfunction s/p MDT PPM - Follows with Dr Caryl Comes.   8. DM - Continue Jardiance. - No GU symptoms.  9. Obesity - Needs weight loss.  - Body mass index is 37.59 kg/m.   Follow up in 1-2 weeks with APP and in 2 months with Dr. Haroldine Laws + echo as scheduled.   Thornhill, FNP  11/17/2021 2:14 PM  Advanced Heart Failure [place] [address] [facility] [place] [address] [phone] (office) [phone] (fax)

## 2021-11-16 ENCOUNTER — Telehealth (HOSPITAL_COMMUNITY): Payer: Self-pay

## 2021-11-16 NOTE — Telephone Encounter (Signed)
Called to confirm/remind patient of their appointment at the [facility] on 11/17/21.  ? ?Patient reminded to bring all medications and/or complete list. ? ?Confirmed patient has transportation. Gave directions, instructed to utilize [place] parking. ? ?Confirmed appointment prior to ending call.  ? ?

## 2021-11-17 ENCOUNTER — Encounter (HOSPITAL_COMMUNITY): Payer: Self-pay

## 2021-11-17 ENCOUNTER — Other Ambulatory Visit: Payer: Self-pay

## 2021-11-17 ENCOUNTER — Ambulatory Visit (HOSPITAL_COMMUNITY)
Admission: RE | Admit: 2021-11-17 | Discharge: 2021-11-17 | Disposition: A | Discharge status: Home / Self Care | Payer: Medicare Other | Source: Ambulatory Visit | Attending: Cardiology | Admitting: Cardiology

## 2021-11-17 VITALS — BP 150/66 | HR 88 | Wt 212.2 lb

## 2021-11-17 DIAGNOSIS — K219 Gastro-esophageal reflux disease without esophagitis: Secondary | ICD-10-CM | POA: Insufficient documentation

## 2021-11-17 DIAGNOSIS — I10 Essential (primary) hypertension: Secondary | ICD-10-CM | POA: Diagnosis not present

## 2021-11-17 DIAGNOSIS — I130 Hypertensive heart and chronic kidney disease with heart failure and stage 1 through stage 4 chronic kidney disease, or unspecified chronic kidney disease: Secondary | ICD-10-CM | POA: Insufficient documentation

## 2021-11-17 DIAGNOSIS — Z950 Presence of cardiac pacemaker: Secondary | ICD-10-CM | POA: Insufficient documentation

## 2021-11-17 DIAGNOSIS — I272 Pulmonary hypertension, unspecified: Secondary | ICD-10-CM | POA: Insufficient documentation

## 2021-11-17 DIAGNOSIS — G4733 Obstructive sleep apnea (adult) (pediatric): Secondary | ICD-10-CM | POA: Insufficient documentation

## 2021-11-17 DIAGNOSIS — R0600 Dyspnea, unspecified: Secondary | ICD-10-CM | POA: Diagnosis not present

## 2021-11-17 DIAGNOSIS — E669 Obesity, unspecified: Secondary | ICD-10-CM | POA: Insufficient documentation

## 2021-11-17 DIAGNOSIS — I350 Nonrheumatic aortic (valve) stenosis: Secondary | ICD-10-CM

## 2021-11-17 DIAGNOSIS — N184 Chronic kidney disease, stage 4 (severe): Secondary | ICD-10-CM

## 2021-11-17 DIAGNOSIS — I5033 Acute on chronic diastolic (congestive) heart failure: Secondary | ICD-10-CM | POA: Diagnosis not present

## 2021-11-17 DIAGNOSIS — Z794 Long term (current) use of insulin: Secondary | ICD-10-CM

## 2021-11-17 DIAGNOSIS — I5032 Chronic diastolic (congestive) heart failure: Secondary | ICD-10-CM

## 2021-11-17 DIAGNOSIS — E1136 Type 2 diabetes mellitus with diabetic cataract: Secondary | ICD-10-CM | POA: Insufficient documentation

## 2021-11-17 DIAGNOSIS — Z79899 Other long term (current) drug therapy: Secondary | ICD-10-CM | POA: Diagnosis not present

## 2021-11-17 DIAGNOSIS — I495 Sick sinus syndrome: Secondary | ICD-10-CM | POA: Diagnosis not present

## 2021-11-17 DIAGNOSIS — Z85038 Personal history of other malignant neoplasm of large intestine: Secondary | ICD-10-CM | POA: Diagnosis not present

## 2021-11-17 DIAGNOSIS — E1122 Type 2 diabetes mellitus with diabetic chronic kidney disease: Secondary | ICD-10-CM | POA: Diagnosis not present

## 2021-11-17 DIAGNOSIS — N1832 Chronic kidney disease, stage 3b: Secondary | ICD-10-CM | POA: Diagnosis not present

## 2021-11-17 DIAGNOSIS — Z6837 Body mass index (BMI) 37.0-37.9, adult: Secondary | ICD-10-CM | POA: Diagnosis not present

## 2021-11-17 DIAGNOSIS — Z7984 Long term (current) use of oral hypoglycemic drugs: Secondary | ICD-10-CM | POA: Diagnosis not present

## 2021-11-17 MED ORDER — METOLAZONE 2.5 MG PO TABS: 2.5000 mg | ORAL_TABLET | Freq: Once | ORAL | 0 refills | Status: DC

## 2021-11-17 NOTE — Patient Instructions (Signed)
Medication Changes: ? ?Increase Torsemide to 40 mg (2 tabs) Twice daily FOR 3 DAYS ONLY, then take 40 mg (2 tabs) Daily ? ?Take Metolazone 2.5 mg for 1 dose ? ?Take an extra 40 meq (2 tabs) of Potassium when you take Metolazone ? ?Lab Work: ? ?Your physician recommends that you return for lab work in: 1-2 weeks ? ?Testing/Procedures: ? ?Your physician has requested that you have an ankle brachial index (ABI). During this test an ultrasound and blood pressure cuff are used to evaluate the arteries that supply the arms and legs with blood. Allow thirty minutes for this exam. There are no restrictions or special instructions. THIS IS DONE AT [facility], THEY WILL CALL YOU TO SCHEDULE ? ?Referrals: ? ?You have been referred to [facility] for Unna boots, they will call you to set this up ? ?Special Instructions // Education: ? ?Do the following things EVERYDAY: ?Weigh yourself in the morning before breakfast. Write it down and keep it in a log. ?Take your medicines as prescribed ?Eat low salt foods--Limit salt (sodium) to 2000 mg per day.  ?Stay as active as you can everyday ?Limit all fluids for the day to less than 2 liters ? ?Please wear your compression hose daily, place them on as soon as you get up in the morning and remove before you go to bed at night. ? ?Follow-Up in: 2-3 weeks ? ?At the [facility], you and your health needs are our priority. We have a designated team specialized in the treatment of Heart Failure. This Care Team includes your primary Heart Failure Specialized Cardiologist (physician), Advanced Practice Providers (APPs- Physician Assistants and Nurse Practitioners), and Pharmacist who all work together to provide you with the care you need, when you need it.  ? ?You may see any of the following providers on your designated Care Team at your next follow up: ? ?Dr Glori Bickers ?Dr Loralie Champagne ?Darrick Grinder, NP ?Lyda Jester, PA ?Jessica Milford,NP ?Marlyce Huge,  PA ?Audry Riles, PharmD ? ? ?Please be sure to bring in all your medications bottles to every appointment.  ? ?Need to Contact us: ? ?If you have any questions or concerns before your next appointment please send [place] a message through [place] or call our office at [phone].   ? ?TO LEAVE A MESSAGE FOR THE NURSE SELECT OPTION 2, PLEASE LEAVE A MESSAGE INCLUDING: ?YOUR NAME ?DATE OF BIRTH ?CALL BACK NUMBER ?REASON FOR CALL**this is important as we prioritize the call backs ? ?YOU WILL RECEIVE A CALL BACK THE SAME DAY AS LONG AS YOU CALL BEFORE 4:00 PM ? ? ?

## 2021-11-22 ENCOUNTER — Other Ambulatory Visit: Payer: Self-pay | Admitting: Internal Medicine

## 2021-11-23 ENCOUNTER — Other Ambulatory Visit: Payer: Self-pay

## 2021-11-23 ENCOUNTER — Ambulatory Visit (HOSPITAL_COMMUNITY)
Admission: RE | Admit: 2021-11-23 | Discharge: 2021-11-23 | Disposition: A | Discharge status: Home / Self Care | Payer: Medicare Other | Source: Ambulatory Visit | Attending: Family Medicine | Admitting: Family Medicine

## 2021-11-23 DIAGNOSIS — D631 Anemia in chronic kidney disease: Secondary | ICD-10-CM | POA: Diagnosis not present

## 2021-11-23 DIAGNOSIS — N2581 Secondary hyperparathyroidism of renal origin: Secondary | ICD-10-CM | POA: Diagnosis not present

## 2021-11-23 DIAGNOSIS — E1122 Type 2 diabetes mellitus with diabetic chronic kidney disease: Secondary | ICD-10-CM | POA: Diagnosis not present

## 2021-11-23 DIAGNOSIS — N184 Chronic kidney disease, stage 4 (severe): Secondary | ICD-10-CM | POA: Diagnosis not present

## 2021-11-23 DIAGNOSIS — I129 Hypertensive chronic kidney disease with stage 1 through stage 4 chronic kidney disease, or unspecified chronic kidney disease: Secondary | ICD-10-CM | POA: Diagnosis not present

## 2021-11-23 DIAGNOSIS — I5032 Chronic diastolic (congestive) heart failure: Secondary | ICD-10-CM | POA: Diagnosis not present

## 2021-11-23 DIAGNOSIS — I5030 Unspecified diastolic (congestive) heart failure: Secondary | ICD-10-CM | POA: Diagnosis not present

## 2021-11-28 ENCOUNTER — Other Ambulatory Visit (HOSPITAL_COMMUNITY): Payer: Medicare Other

## 2021-11-29 ENCOUNTER — Other Ambulatory Visit (HOSPITAL_COMMUNITY): Payer: Medicare Other

## 2021-11-30 ENCOUNTER — Ambulatory Visit (HOSPITAL_COMMUNITY)
Admission: RE | Admit: 2021-11-30 | Discharge: 2021-11-30 | Disposition: A | Discharge status: Home / Self Care | Payer: Medicare Other | Source: Ambulatory Visit | Attending: Adult Health | Admitting: Adult Health

## 2021-11-30 ENCOUNTER — Other Ambulatory Visit: Payer: Self-pay

## 2021-11-30 ENCOUNTER — Other Ambulatory Visit: Payer: Self-pay | Admitting: Cardiology

## 2021-11-30 DIAGNOSIS — I5032 Chronic diastolic (congestive) heart failure: Secondary | ICD-10-CM | POA: Diagnosis not present

## 2021-11-30 LAB — BASIC METABOLIC PANEL
Anion gap: 13 (ref 5–15)
BUN: 80 mg/dL — ABNORMAL HIGH (ref 8–23)
CO2: 27 mmol/L (ref 22–32)
Calcium: 9.3 mg/dL (ref 8.9–10.3)
Chloride: 96 mmol/L — ABNORMAL LOW (ref 98–111)
Creatinine, Ser: 2.38 mg/dL — ABNORMAL HIGH (ref 0.44–1.00)
GFR, Estimated: 20 mL/min — ABNORMAL LOW (ref >60)
Glucose, Bld: 112 mg/dL — ABNORMAL HIGH (ref 70–99)
Potassium: 3.7 mmol/L (ref 3.5–5.1)
Sodium: 136 mmol/L (ref 135–145)

## 2021-12-07 ENCOUNTER — Telehealth (HOSPITAL_COMMUNITY): Payer: Self-pay

## 2021-12-07 NOTE — Telephone Encounter (Signed)
Called and left patient a voicemail to confirm/remind patient of their appointment at the [facility] on 12/07/21.  ? ? ?

## 2021-12-08 ENCOUNTER — Encounter (HOSPITAL_COMMUNITY): Payer: Medicare Other

## 2021-12-08 NOTE — Progress Notes (Incomplete)
?  ? ?Advanced Heart Failure Clinic Note ? ? ?Date:  12/08/2021  ? ?ID:  [name], DOB May 20, 1941, MRN [id number]  Location: Home  ?Provider location: [facility] ?Type of Visit: Established patient ? ?PCP:  Caren Macadam, MD  ?Cardiologist:  Fransico Him, MD ?Nephrology: Dr. Justin Mend ?Primary HF: [place] ? ?Chief Complaint: Heart Failure follow-up ?  ?HPI: ?[name] is a 81 y.o.Marland Kitchen female with history of diastolic heart failure, DM, GERD, colon cancer, CKD stage III, 2016 pacemaker (MDT) due to symptomatic sinus node dysfunction. ? ?In 7/19, she had 12 pound weight and she was sent to the ED by her PCP. She was sent home on metolazone every other day for 14 days. She took 4 doses and her creatinine went up to 3 so metolazone was stopped.  ? ?R/L cath in 9/19. Normal cors. Elevated filling pressures with pulmonary venous HTN.  ? ?Had fall with R tibia fracture in 9/21  ?  ?Having more dyspnea at follow up 2/22, arranged for RHC and repeat echo. ? ?RHC (2/22) showed mild to moderate elevated filling pressures with prominent v waves in PCWP tracing, mild pulmonary HTN and high CO with no evidence of intracardiac shunting. Echo (4/22) showed EF 60-65%, RV ok, mild AS. ? ?SCr remained elevated on lab check at EP visit 8/22 and irbesartan stopped. ? ?Follow up 11/22 she had NYHA III symptoms, volume was ok. Volume mildly up at 2/23 visit, torsemide increased to 40 daily, echo ordered. ? ?Today she returns for HF follow up. She is SOB walking around the house and with ADLs, back pain continues to cause significant limitations in her mobility. Unable to do housework. She has a daughter who helps her some. Continues to have LE swelling, did not wear compression hose today. Chronically sleeps in recliner.  Denies CP, dizziness, palpitations, or PND/Orthopnea. Appetite ok. No fever or chills. Weight at home 205 pounds. Taking all medications.  ? ? ?Cardiac Studies: ?- Echo (4/22): EF 60-65%,  RV ok, mild AS mean gradient 21.0 mmHg ? ?- RHC (2/22):  ?RA = 9 ?RV = 49/13 ?PA = 49/17 (33) ?PCW = 22 ( v waves 28) ?Fick cardiac output/index = 8.0/3.9 ?PVR = 1.6 WU ?FA sat = 95% ?PA sat = 74%, 73% ?High SVC sat = 69% ? ?Assessment: ?1. Mild to moderately elevated filling pressures with prominent v waves in PCWP tracing suggestive of diastolic dysfunction versus mitral regurgitation ?2. Mild pulmonary venous HTN ?3. High cardiac output with no evidence of intracardiac shunting ? ?- PFTs 05/23/18: ?FEV1: 1.72 ?FEV1/FVC ratio: 77% ?DLCO: 70% ? ?- R/LHC 05/19/18: ?Ao = 192/74 (118) ?LV = 201/26 ?RA = 14 ?RV = 53/18 ?PA = 53/19 (38) ?PCW = 27 (v = 41) ?Fick cardiac output/index = 5.6/2.8 ?PVR = 2.0 WU ?Ao sat = 95% ?PA sat = 64%, 66% ?Assessment: ?1. Normal coronaries with ectactic vessels suggestive of longstanding HTN ?2. Severe HTN ?3. Normal LV function ?4. Signficantly elevated filling pressures with pulmonary venous HTN in setting of holding diuretics for 2 days ?Plan/Discussion: ?Filling pressures and BP elevated. Will resume diuretics. Will need aggressive titration of ant-HTN regimen. ? ?- Echo 05/14/18: ?- Left ventricle: Posterior basal and inferolateral hypokinesis The ?  cavity size was moderately dilated. Wall thickness was increased ?  in a pattern of mild LVH. Systolic function was normal. The ?  estimated ejection fraction was in the range of 50% to 55%. Left ?  ventricular diastolic function  parameters were normal. ?- Aortic valve: Sclerosis without stenosis. ?- Mitral valve: Moderately calcified annulus. Moderately thickened, ?  mildly calcified leaflets . ?- Left atrium: The atrium was mildly dilated. ?- Atrial septum: No defect or patent foramen ovale was identified.  ? ?-Sleep study 10/19: AHI 49 ? ?- 06/21/2015 Myoview  ?Nuclear stress EF: 64%. ?There was no ST segment deviation noted during stress. ?The study is normal. ?This is a low risk study. No ischemia identified. ? ?SH: Former 30-40 pack  year smoker, quit 25 years ago.  ? ?Past Medical History:  ?Diagnosis Date  ? Anxiety   ? Arthritis   ? Cancer [facility])   ? colon  ? CHF (congestive heart failure) ([place])   ? Chronic kidney disease   ? stage 3  ? Chronic pain syndrome   ? Diabetes ([place])   ? Diabetic neuropathy ([place])   ? Dyslipidemia   ? Early cataracts, bilateral   ? Fibromyalgia   ? GERD (gastroesophageal reflux disease)   ? H/O syncope   ? Heart murmur   ? Hypertension   ? Lumbar spinal stenosis   ? and scoliosis  ? Pneumonia   ? PONV (postoperative nausea and vomiting)   ? Presence of permanent cardiac pacemaker   ? Restless legs   ? Spinal headache   ? Thyroid nodule   ? ?Past Surgical History:  ?Procedure Laterality Date  ? APPENDECTOMY    ? BREAST SURGERY    ? lumpectomy  ? COLON SURGERY    ? DILATION AND CURETTAGE OF UTERUS    ? EP IMPLANTABLE DEVICE N/A 07/21/2015  ? Procedure: Pacemaker Implant;  Surgeon: Deboraha Sprang, MD;  Location: [place] [facility];  Service: Cardiovascular;  Laterality: N/A;  ? LUMBAR LAMINECTOMY/DECOMPRESSION MICRODISCECTOMY N/A 02/02/2016  ? Procedure: DECOMPRESSION L4-L5 WITH INSITE 2 FUSION ;  Surgeon: Melina Schools, MD;  Location: [place];  Service: Orthopedics;  Laterality: N/A;  ? PARTIAL NEPHRECTOMY    ? RIGHT HEART CATH N/A 11/02/2020  ? Procedure: RIGHT HEART CATH;  Surgeon: Jolaine Artist, MD;  Location: [place] [facility];  Service: Cardiovascular;  Laterality: N/A;  ? RIGHT/LEFT HEART CATH AND CORONARY ANGIOGRAPHY N/A 05/19/2018  ? Procedure: RIGHT/LEFT HEART CATH AND CORONARY ANGIOGRAPHY;  Surgeon: Jolaine Artist, MD;  Location: [place] CV [facility];  Service: Cardiovascular;  Laterality: N/A;  ? TIBIA IM NAIL INSERTION Right 05/19/2020  ? Procedure: INTRAMEDULLARY (IM) NAIL TIBIAL;  Surgeon: Nicholes Stairs, MD;  Location: [place];  Service: Orthopedics;  Laterality: Right;  ? TONSILLECTOMY    ? TUBAL LIGATION    ? ?Current Outpatient Medications  ?Medication Sig Dispense Refill  ? ACCU-CHEK AVIVA PLUS  test strip     ? Accu-Chek Softclix Lancets lancets     ? amLODipine (NORVASC) 5 MG tablet Take 1 tablet (5 mg total) by mouth daily. 180 tablet 3  ? B-D UF III MINI PEN NEEDLES 31G X 5 MM MISC     ? Blood Glucose Monitoring Suppl (ACCU-CHEK AVIVA PLUS) w/Device KIT     ? calcium carbonate (OS-CAL) 600 MG TABS tablet Patient takes 1 tablet by mouth twice a week.    ? Cholecalciferol (VITAMIN D) 2000 UNITS tablet Take 2,000 Units by mouth daily.    ? Cromolyn Sodium (NASAL ALLERGY NA) Place 1 spray into the nose at bedtime as needed (sleep).    ? cyclobenzaprine (FLEXERIL) 10 MG tablet Take 10 mg by mouth 3 (three) times daily as needed  for muscle spasms.    ? diclofenac Sodium (VOLTAREN) 1 % GEL Apply 1 application topically at bedtime as needed (sleep).    ? diphenhydrAMINE (BENADRYL) 25 MG tablet Take 25 mg by mouth daily as needed for itching or allergies.    ? DULoxetine (CYMBALTA) 20 MG capsule Take 20 mg by mouth daily.    ? empagliflozin (JARDIANCE) 10 MG TABS tablet Take 1 tablet (10 mg total) by mouth daily before breakfast. 90 tablet 3  ? gabapentin (NEURONTIN) 300 MG capsule Take 300 mg by mouth at bedtime.    ? hydrALAZINE (APRESOLINE) 100 MG tablet Take 1 tablet (100 mg total) by mouth 3 (three) times daily. 90 tablet 6  ? insulin degludec (TRESIBA FLEXTOUCH) 200 UNIT/ML FlexTouch Pen Inject 75 Units into the skin daily.    ? labetalol (NORMODYNE) 200 MG tablet TAKE ONE TABLET BY MOUTH TWICE A DAY 180 tablet 3  ? metolazone (ZAROXOLYN) 2.5 MG tablet Take 1 tablet (2.5 mg total) by mouth once for 1 dose. 1 tablet 0  ? potassium chloride SA (KLOR-CON M) 20 MEQ tablet Take 2 tablets (40 mEq total) by mouth daily. 180 tablet 3  ? pramipexole (MIRAPEX) 0.125 MG tablet Take 0.25 mg by mouth 2 (two) times daily.    ? rosuvastatin (CRESTOR) 10 MG tablet Take 10 mg by mouth daily.     ? torsemide (DEMADEX) 20 MG tablet Take 2 tablets (40 mg total) by mouth daily. 60 tablet 11  ? traMADol (ULTRAM) 50 MG tablet  Take 2 tablets by mouth daily as needed.    ? ?No current facility-administered medications for this visit.  ? ?Allergies:   Penicillins, Codeine, Adhesive [tape], Aspirin, Crab (diagnostic), Iodine, Iv

## 2022-01-17 ENCOUNTER — Encounter (HOSPITAL_COMMUNITY): Payer: Self-pay | Admitting: Internal Medicine

## 2022-01-17 ENCOUNTER — Ambulatory Visit (HOSPITAL_COMMUNITY)
Admission: RE | Admit: 2022-01-17 | Discharge: 2022-01-17 | Disposition: A | Discharge status: Home / Self Care | Payer: Medicare Other | Source: Ambulatory Visit | Attending: Internal Medicine | Admitting: Internal Medicine

## 2022-01-17 ENCOUNTER — Ambulatory Visit (HOSPITAL_BASED_OUTPATIENT_CLINIC_OR_DEPARTMENT_OTHER)
Admission: RE | Admit: 2022-01-17 | Discharge: 2022-01-17 | Disposition: A | Discharge status: Home / Self Care | Payer: Medicare Other | Source: Ambulatory Visit | Attending: Internal Medicine | Admitting: Internal Medicine

## 2022-01-17 VITALS — BP 164/80 | HR 88 | Wt 217.4 lb

## 2022-01-17 DIAGNOSIS — G4719 Other hypersomnia: Secondary | ICD-10-CM

## 2022-01-17 DIAGNOSIS — I5022 Chronic systolic (congestive) heart failure: Secondary | ICD-10-CM

## 2022-01-17 DIAGNOSIS — N184 Chronic kidney disease, stage 4 (severe): Secondary | ICD-10-CM

## 2022-01-17 DIAGNOSIS — E118 Type 2 diabetes mellitus with unspecified complications: Secondary | ICD-10-CM | POA: Diagnosis not present

## 2022-01-17 DIAGNOSIS — I350 Nonrheumatic aortic (valve) stenosis: Secondary | ICD-10-CM

## 2022-01-17 DIAGNOSIS — I5032 Chronic diastolic (congestive) heart failure: Secondary | ICD-10-CM | POA: Diagnosis not present

## 2022-01-17 DIAGNOSIS — Z950 Presence of cardiac pacemaker: Secondary | ICD-10-CM | POA: Diagnosis not present

## 2022-01-17 LAB — ECHOCARDIOGRAM COMPLETE
AR max vel: 0.85 cm2
AV Area VTI: 0.88 cm2
AV Area mean vel: 0.87 cm2
AV Mean grad: 13 mmHg
AV Peak grad: 24.4 mmHg
Ao pk vel: 2.47 m/s
Area-P 1/2: 2.73 cm2
S' Lateral: 3.4 cm

## 2022-01-17 MED ORDER — POTASSIUM CHLORIDE CRYS ER 20 MEQ PO TBCR: 20.0000 meq | EXTENDED_RELEASE_TABLET | Freq: Every day | ORAL | 3 refills | Status: DC

## 2022-01-17 MED ORDER — METOLAZONE 2.5 MG PO TABS: 2.5000 mg | ORAL_TABLET | ORAL | 1 refills | Status: DC

## 2022-01-17 NOTE — Patient Instructions (Signed)
Medication Changes: ? ?Take Metolazone 2.5 mg every Friday ? ?Take an extra 20 meq (1 tab) of Potassium on Fridays when you take Metolazone ? ?Lab Work: ? ?None ? ?Testing/Procedures: ? ?Your provider has recommended that you have a home sleep study.  We have provided you with the equipment in our office today. Please download the app and follow the instructions. YOUR PIN NUMBER IS: [id number]. Once you have completed the test you just dispose of the equipment, the information is automatically uploaded to [place] via blue-tooth technology. If your test is positive for sleep apnea and you need a home CPAP machine you will be contacted by Dr Theodosia Blender office [facility]) to set this up. ? ? ?Referrals: ? ?You have been referred to Pharmacy Clinic for weight loss medication, they will call you to discuss ? ?Special Instructions // Education: ? ?Do the following things EVERYDAY: ?Weigh yourself in the morning before breakfast. Write it down and keep it in a log. ?Take your medicines as prescribed ?Eat low salt foods--Limit salt (sodium) to 2000 mg per day.  ?Stay as active as you can everyday ?Limit all fluids for the day to less than 2 liters ? ?Limit your fluid intake to less than 2 liters of fluid per day. Fluid includes all drinks, coffee, juice, ice chips, soup, jello, and all other liquids. ? ?Wrap your legs every day ? ?Follow-Up in: 1 month ? ?At the [facility], you and your health needs are our priority. We have a designated team specialized in the treatment of Heart Failure. This Care Team includes your primary Heart Failure Specialized Cardiologist (physician), Advanced Practice Providers (APPs- Physician Assistants and Nurse Practitioners), and Pharmacist who all work together to provide you with the care you need, when you need it.  ? ?You may see any of the following providers on your designated Care Team at your next follow up: ? ?Dr Glori Bickers ?Dr Loralie Champagne ?Darrick Grinder, NP ?Lyda Jester, PA ?Jessica Milford,NP ?Marlyce Huge, PA ?Audry Riles, PharmD ? ? ?Please be sure to bring in all your medications bottles to every appointment.  ? ?Need to Contact us: ? ?If you have any questions or concerns before your next appointment please send [place] a message through [place] or call our office at [phone].   ? ?TO LEAVE A MESSAGE FOR THE NURSE SELECT OPTION 2, PLEASE LEAVE A MESSAGE INCLUDING: ?YOUR NAME ?DATE OF BIRTH ?CALL BACK NUMBER ?REASON FOR CALL**this is important as we prioritize the call backs ? ?YOU WILL RECEIVE A CALL BACK THE SAME DAY AS LONG AS YOU CALL BEFORE 4:00 PM ? ? ?

## 2022-01-17 NOTE — Progress Notes (Signed)
?  ? ?Advanced Heart Failure Clinic Note ? ? ?Date:  01/17/2022  ? ?ID:  [name], DOB [birth year], MRN [id number]  Location: Home  ?Provider location: [facility] ?Type of Visit: Established patient ? ?PCP:  Caren Macadam, MD  ?Cardiologist:  Fransico Him, MD ?Nephrology: Dr. Justin Mend ?Primary HF: [name] ? ?Chief Complaint: Heart Failure follow-up ?  ?HPI: ?[name] is a 81 y.o.Marland Kitchen female with history of diastolic heart failure, DM, GERD, colon cancer, CKD stage III, 2016 pacemaker (MDT) due to symptomatic sinus node dysfunction. ? ?In 7/19, she had 12 pound weight and she was sent to the ED by her PCP. She was sent home on metolazone every other day for 14 days. She took 4 doses and her creatinine went up to 3 so metolazone was stopped.  ? ?R/L cath in 9/19. Normal cors. Elevated filling pressures with pulmonary venous HTN.  ? ?Had fall with R tibia fracture in 9/21  ?  ?Having more dyspnea at follow up 2/22, arranged for RHC and repeat echo. ? ?RHC (2/22) showed mild to moderate elevated filling pressures with prominent v waves in PCWP tracing, mild pulmonary HTN and high CO with no evidence of intracardiac shunting. Echo (4/22) showed EF 60-65%, RV ok, mild AS. ? ?SCr remained elevated on lab check at EP visit 8/22 and irbesartan stopped. ? ?Echo today 01/17/22 EF 60-65% G1DD RV normal. RVSP 31 ? ?Today she returns for HF follow up. Continues to be SOB with minimal activity. Takes torsemide 40 daily. Doesn't use sliding scale. + LE edema. Weighs herself every day.Says he stays thirsty al the time. Drinks a lot of cold water. BP typically 140/70. Unable to wear compression stockings. Doesn't keep her feet elevated. Never followed through with treatment for OSA ? ? ?Cardiac Studies: ?- Echo (4/22): EF 60-65%, RV ok, mild AS mean gradient 21.0 mmHg ? ?- RHC (2/22):  ?RA = 9 ?RV = 49/13 ?PA = 49/17 (33) ?PCW = 22 ( v waves 28) ?Fick cardiac output/index = 8.0/3.9 ?PVR = 1.6 WU ?FA  sat = 95% ?PA sat = 74%, 73% ?High SVC sat = 69% ? ?Assessment: ?1. Mild to moderately elevated filling pressures with prominent v waves in PCWP tracing suggestive of diastolic dysfunction versus mitral regurgitation ?2. Mild pulmonary venous HTN ?3. High cardiac output with no evidence of intracardiac shunting ? ?- PFTs 05/23/18: ?FEV1: 1.72 ?FEV1/FVC ratio: 77% ?DLCO: 70% ? ?- R/LHC 05/19/18: ?Ao = 192/74 (118) ?LV = 201/26 ?RA = 14 ?RV = 53/18 ?PA = 53/19 (38) ?PCW = 27 (v = 41) ?Fick cardiac output/index = 5.6/2.8 ?PVR = 2.0 WU ?Ao sat = 95% ?PA sat = 64%, 66% ?Assessment: ?1. Normal coronaries with ectactic vessels suggestive of longstanding HTN ?2. Severe HTN ?3. Normal LV function ?4. Signficantly elevated filling pressures with pulmonary venous HTN in setting of holding diuretics for 2 days ?Plan/Discussion: ?Filling pressures and BP elevated. Will resume diuretics. Will need aggressive titration of ant-HTN regimen. ? ?- Echo 05/14/18: ?- Left ventricle: Posterior basal and inferolateral hypokinesis The ?  cavity size was moderately dilated. Wall thickness was increased ?  in a pattern of mild LVH. Systolic function was normal. The ?  estimated ejection fraction was in the range of 50% to 55%. Left ?  ventricular diastolic function parameters were normal. ?- Aortic valve: Sclerosis without stenosis. ?- Mitral valve: Moderately calcified annulus. Moderately thickened, ?  mildly calcified leaflets . ?- Left atrium: The atrium was  mildly dilated. ?- Atrial septum: No defect or patent foramen ovale was identified.  ? ?-Sleep study 10/19: AHI 49 ? ?- 06/21/2015 Myoview  ?Nuclear stress EF: 64%. ?There was no ST segment deviation noted during stress. ?The study is normal. ?This is a low risk study. No ischemia identified. ? ?SH: Former 30-40 pack year smoker, quit 25 years ago.  ? ?Past Medical History:  ?Diagnosis Date  ? Anxiety   ? Arthritis   ? Cancer [facility])   ? colon  ? CHF (congestive heart failure) ([place])   ?  Chronic kidney disease   ? stage 3  ? Chronic pain syndrome   ? Diabetes ([place])   ? Diabetic neuropathy ([place])   ? Dyslipidemia   ? Early cataracts, bilateral   ? Fibromyalgia   ? GERD (gastroesophageal reflux disease)   ? H/O syncope   ? Heart murmur   ? Hypertension   ? Lumbar spinal stenosis   ? and scoliosis  ? Pneumonia   ? PONV (postoperative nausea and vomiting)   ? Presence of permanent cardiac pacemaker   ? Restless legs   ? Spinal headache   ? Thyroid nodule   ? ?Past Surgical History:  ?Procedure Laterality Date  ? APPENDECTOMY    ? BREAST SURGERY    ? lumpectomy  ? COLON SURGERY    ? DILATION AND CURETTAGE OF UTERUS    ? EP IMPLANTABLE DEVICE N/A 07/21/2015  ? Procedure: Pacemaker Implant;  Surgeon: Deboraha Sprang, MD;  Location: [place] [facility];  Service: Cardiovascular;  Laterality: N/A;  ? LUMBAR LAMINECTOMY/DECOMPRESSION MICRODISCECTOMY N/A 02/02/2016  ? Procedure: DECOMPRESSION L4-L5 WITH INSITE 2 FUSION ;  Surgeon: Melina Schools, MD;  Location: [place];  Service: Orthopedics;  Laterality: N/A;  ? PARTIAL NEPHRECTOMY    ? RIGHT HEART CATH N/A 11/02/2020  ? Procedure: RIGHT HEART CATH;  Surgeon: Jolaine Artist, MD;  Location: [place] CV LAB;  Service: Cardiovascular;  Laterality: N/A;  ? RIGHT/LEFT HEART CATH AND CORONARY ANGIOGRAPHY N/A 05/19/2018  ? Procedure: RIGHT/LEFT HEART CATH AND CORONARY ANGIOGRAPHY;  Surgeon: Jolaine Artist, MD;  Location: [place] [facility];  Service: Cardiovascular;  Laterality: N/A;  ? TIBIA IM NAIL INSERTION Right 05/19/2020  ? Procedure: INTRAMEDULLARY (IM) NAIL TIBIAL;  Surgeon: Nicholes Stairs, MD;  Location: [place];  Service: Orthopedics;  Laterality: Right;  ? TONSILLECTOMY    ? TUBAL LIGATION    ? ?Current Outpatient Medications  ?Medication Sig Dispense Refill  ? ACCU-CHEK AVIVA PLUS test strip     ? Accu-Chek Softclix Lancets lancets     ? amLODipine (NORVASC) 5 MG tablet Take 1 tablet (5 mg total) by mouth daily. 180 tablet 3  ? B-D UF III MINI PEN  NEEDLES 31G X 5 MM MISC     ? Blood Glucose Monitoring Suppl (ACCU-CHEK AVIVA PLUS) w/Device KIT     ? calcium carbonate (OS-CAL) 600 MG TABS tablet Patient takes 1 tablet by mouth twice a week.    ? Cholecalciferol (VITAMIN D) 2000 UNITS tablet Take 2,000 Units by mouth daily.    ? Cromolyn Sodium (NASAL ALLERGY NA) Place 1 spray into the nose at bedtime as needed (sleep).    ? cyclobenzaprine (FLEXERIL) 10 MG tablet Take 10 mg by mouth 3 (three) times daily as needed for muscle spasms.    ? diclofenac Sodium (VOLTAREN) 1 % GEL Apply 1 application topically at bedtime as needed (sleep).    ? diphenhydrAMINE (BENADRYL) 25  MG tablet Take 25 mg by mouth daily as needed for itching or allergies.    ? DULoxetine (CYMBALTA) 20 MG capsule Take 20 mg by mouth daily.    ? empagliflozin (JARDIANCE) 10 MG TABS tablet Take 1 tablet (10 mg total) by mouth daily before breakfast. 90 tablet 3  ? gabapentin (NEURONTIN) 300 MG capsule Take 300 mg by mouth at bedtime.    ? hydrALAZINE (APRESOLINE) 100 MG tablet Take 1 tablet (100 mg total) by mouth 3 (three) times daily. 90 tablet 6  ? insulin degludec (TRESIBA FLEXTOUCH) 200 UNIT/ML FlexTouch Pen Inject 75 Units into the skin daily.    ? labetalol (NORMODYNE) 200 MG tablet TAKE ONE TABLET BY MOUTH TWICE A DAY 180 tablet 3  ? Potassium Chloride (KLOR-CON PO) Take 20 mg by mouth daily.    ? pramipexole (MIRAPEX) 0.125 MG tablet Take 0.25 mg by mouth 2 (two) times daily.    ? rosuvastatin (CRESTOR) 10 MG tablet Take 10 mg by mouth daily.     ? torsemide (DEMADEX) 20 MG tablet Take 2 tablets (40 mg total) by mouth daily. 60 tablet 11  ? traMADol (ULTRAM) 50 MG tablet Take 2 tablets by mouth daily as needed. 4 times a day if    ? ?No current facility-administered medications for this encounter.  ? ?Allergies:   Penicillins, Codeine, Adhesive [tape], Aspirin, Crab (diagnostic), Iodine, Ivp dye [iodinated contrast media], and Sulfa antibiotics  ? ?Social History:  The patient  reports  that she quit smoking about 38 years ago. Her smoking use included cigarettes. She started smoking about 68 years ago. She has never used smokeless tobacco. She reports current alcohol use. She reports that s

## 2022-01-17 NOTE — Progress Notes (Signed)
?  Echocardiogram ?2D Echocardiogram has been performed. ? ?[name] ?01/17/2022, 2:59 PM ?

## 2022-01-18 ENCOUNTER — Telehealth: Payer: Self-pay | Admitting: Pharmacist Clinician (PhC)/ Clinical Pharmacy Specialist

## 2022-01-18 NOTE — Telephone Encounter (Signed)
Spoke with patient, she has dx of DM, so Ozempic will be covered.  Scheduled at [place] office for next week ?

## 2022-01-18 NOTE — Telephone Encounter (Signed)
-----   Message from Leeroy Bock, [place] sent at 01/18/2022  8:21 AM EDT ----- ? ?----- Message ----- ?From: Scarlette Calico, RN ?Sent: 01/17/2022   4:56 PM EDT ?To: Leeroy Bock, RPH-CPP ? ?Hey, ref for weight loss meds please, thanks ? ? ?

## 2022-01-19 ENCOUNTER — Other Ambulatory Visit: Payer: Medicare PPO

## 2022-01-24 DIAGNOSIS — N184 Chronic kidney disease, stage 4 (severe): Secondary | ICD-10-CM | POA: Diagnosis not present

## 2022-01-24 DIAGNOSIS — I10 Essential (primary) hypertension: Secondary | ICD-10-CM | POA: Diagnosis not present

## 2022-01-24 DIAGNOSIS — G8929 Other chronic pain: Secondary | ICD-10-CM | POA: Diagnosis not present

## 2022-01-24 DIAGNOSIS — R600 Localized edema: Secondary | ICD-10-CM | POA: Diagnosis not present

## 2022-01-24 DIAGNOSIS — E559 Vitamin D deficiency, unspecified: Secondary | ICD-10-CM | POA: Diagnosis not present

## 2022-01-24 DIAGNOSIS — G2581 Restless legs syndrome: Secondary | ICD-10-CM | POA: Diagnosis not present

## 2022-01-24 DIAGNOSIS — Z0000 Encounter for general adult medical examination without abnormal findings: Secondary | ICD-10-CM | POA: Diagnosis not present

## 2022-01-24 DIAGNOSIS — E118 Type 2 diabetes mellitus with unspecified complications: Secondary | ICD-10-CM | POA: Diagnosis not present

## 2022-01-24 DIAGNOSIS — E782 Mixed hyperlipidemia: Secondary | ICD-10-CM | POA: Diagnosis not present

## 2022-01-25 ENCOUNTER — Telehealth (HOSPITAL_COMMUNITY): Payer: Self-pay | Admitting: Surgery

## 2022-01-25 ENCOUNTER — Telehealth (HOSPITAL_COMMUNITY): Payer: Self-pay | Admitting: *Deleted

## 2022-01-25 ENCOUNTER — Ambulatory Visit: Payer: Medicare Other

## 2022-01-25 DIAGNOSIS — E782 Mixed hyperlipidemia: Secondary | ICD-10-CM | POA: Insufficient documentation

## 2022-01-25 DIAGNOSIS — G4700 Insomnia, unspecified: Secondary | ICD-10-CM | POA: Insufficient documentation

## 2022-01-25 DIAGNOSIS — N184 Chronic kidney disease, stage 4 (severe): Secondary | ICD-10-CM | POA: Insufficient documentation

## 2022-01-25 NOTE — Telephone Encounter (Signed)
Pt left vm to r/s appointment today.   Message routed to [facility]

## 2022-01-25 NOTE — Telephone Encounter (Signed)
I called patient to remind her to complete the ordered home sleep study.  She tells me that her family is sick and "no one is available to help me".  She says that she has not forgotten and will complete the study as soon as she is able.

## 2022-01-25 NOTE — Progress Notes (Deleted)
Patient ID: [name]                 DOB: [birth year]                    MRN: [id number]     HPI: [name] is a 81 y.o. female patient referred to pharmacy clinic by Dr Haroldine Laws to initiate weight loss therapy with GLP1-RA. PMH is significant for obesity, T2DM (A1c 7.3), CHF, CKD, and HTN. Most recent BMI 38.63.  *** If diabetic and on insulin/sulfonylurea, can consider reducing dose to reduce risk of hypoglycemia  *** Follow-up visit  Assess % weight loss Assess adverse effects Missed doses  Current weight management medications:   Previously tried meds:  Current meds that may affect weight:  Baseline weight/BMI:  Insurance payor:   Diet:  -Breakfast: -Lunch: -Dinner: -Snacks: -Drinks:  Exercise:   Family History:   Social History:   Labs: Lab Results  Component Value Date   HGBA1C 6.7 (H) 05/20/2020    Wt Readings from Last 1 Encounters:  01/17/22 217 lb 6.4 oz (98.6 kg)    BP Readings from Last 1 Encounters:  01/17/22 (!) 164/80   Pulse Readings from Last 1 Encounters:  01/17/22 88    No results found for: CHOL, TRIG, HDL, CHOLHDL, VLDL, LDLCALC, LDLDIRECT  Past Medical History:  Diagnosis Date   Anxiety    Arthritis    Cancer ([place])    colon   CHF (congestive heart failure) ([place])    Chronic kidney disease    stage 3   Chronic pain syndrome    Diabetes ([place])    Diabetic neuropathy ([place])    Dyslipidemia    Early cataracts, bilateral    Fibromyalgia    GERD (gastroesophageal reflux disease)    H/O syncope    Heart murmur    Hypertension    Lumbar spinal stenosis    and scoliosis   Pneumonia    PONV (postoperative nausea and vomiting)    Presence of permanent cardiac pacemaker    Restless legs    Spinal headache    Thyroid nodule     Current Outpatient Medications on File Prior to Visit  Medication Sig Dispense Refill   ACCU-CHEK AVIVA PLUS test strip      Accu-Chek Softclix Lancets lancets      acetaminophen (TYLENOL)  650 MG CR tablet 2 tablets     amLODipine (NORVASC) 5 MG tablet Take 1 tablet (5 mg total) by mouth daily. 180 tablet 3   amLODipine (NORVASC) 5 MG tablet 1 tablet     B-D UF III MINI PEN NEEDLES 31G X 5 MM MISC      Blood Glucose Monitoring Suppl (ACCU-CHEK AVIVA PLUS) w/Device KIT      calcium carbonate (OS-CAL) 600 MG TABS tablet Patient takes 1 tablet by mouth twice a week.     Cholecalciferol (VITAMIN D) 2000 UNITS tablet Take 2,000 Units by mouth daily.     Continuous Blood Gluc Sensor (FREESTYLE LIBRE 2 SENSOR) MISC See admin instructions.     Cromolyn Sodium (NASAL ALLERGY NA) Place 1 spray into the nose at bedtime as needed (sleep).     cyclobenzaprine (FLEXERIL) 10 MG tablet Take 10 mg by mouth 3 (three) times daily as needed for muscle spasms.     diclofenac Sodium (VOLTAREN) 1 % GEL Apply 1 application topically at bedtime as needed (sleep).     diphenhydrAMINE (BENADRYL) 25 MG tablet Take 25 mg by mouth  daily as needed for itching or allergies.     DULoxetine (CYMBALTA) 20 MG capsule Take 20 mg by mouth daily.     empagliflozin (JARDIANCE) 10 MG TABS tablet Take 1 tablet (10 mg total) by mouth daily before breakfast. 90 tablet 3   gabapentin (NEURONTIN) 300 MG capsule Take 300 mg by mouth at bedtime.     hydrALAZINE (APRESOLINE) 100 MG tablet Take 1 tablet (100 mg total) by mouth 3 (three) times daily. 90 tablet 6   insulin degludec (TRESIBA FLEXTOUCH) 100 UNIT/ML FlexTouch Pen DIAL AND INJECT 75 UNITS UNDER THE SKIN ONCE A DAY     insulin degludec (TRESIBA FLEXTOUCH) 200 UNIT/ML FlexTouch Pen Inject 75 Units into the skin daily.     labetalol (NORMODYNE) 200 MG tablet TAKE ONE TABLET BY MOUTH TWICE A DAY 180 tablet 3   metolazone (ZAROXOLYN) 2.5 MG tablet Take 1 tablet (2.5 mg total) by mouth once a week. Every Friday 5 tablet 1   potassium chloride SA (KLOR-CON M) 20 MEQ tablet Take 1 tablet (20 mEq total) by mouth daily. Take extra tab on Friday with metolazone 35 tablet 3    pramipexole (MIRAPEX) 0.125 MG tablet Take 0.25 mg by mouth 2 (two) times daily.     rosuvastatin (CRESTOR) 10 MG tablet Take 10 mg by mouth daily.      torsemide (DEMADEX) 20 MG tablet Take 2 tablets (40 mg total) by mouth daily. 60 tablet 11   traMADol (ULTRAM) 50 MG tablet Take 2 tablets by mouth daily as needed. 4 times a day if     triamcinolone ointment (KENALOG) 0.1 % SMARTSIG:2 Topical Twice Daily     No current facility-administered medications on file prior to visit.    Allergies  Allergen Reactions   Penicillins Diarrhea and Other (See Comments)    Tolerated Cephalosporin Date: 05/19/20.    Severe diarrhea Has patient had a PCN reaction causing immediate rash, facial/tongue/throat swelling, SOB or lightheadedness with hypotension: No Has patient had a PCN reaction causing severe rash involving mucus membranes or skin necrosis: No Has patient had a PCN reaction that required hospitalization No Has patient had a PCN reaction occurring within the last 10 years: No If all of the above answers are "NO", then may proceed with Cephalosporin use.    Codeine Other (See Comments)    Unknown   Adhesive [Tape] Rash   Aspirin Other (See Comments)    Stomach burning   Crab (Diagnostic) Rash   Iodine Itching, Swelling, Rash and Other (See Comments)    Can use if Benadryl and Prednisone are used first    Ivp Dye [Iodinated Contrast Media] Other (See Comments)    Can use if Benadryl and Prednisone are used first     Sulfa Antibiotics Diarrhea     Assessment/Plan:  1. Weight loss - Patient has not met goal of at least 5% of body weight loss with comprehensive lifestyle modifications alone in the past 3-6 months. Pharmacotherapy is appropriate to pursue as augmentation. Will start ***. Confirmed patient not ***pregnant and no personal or family history of medullary thyroid carcinoma (MTC) or Multiple Endocrine Neoplasia syndrome type 2 (MEN 2).   Advised patient on common side effects  including nausea, diarrhea, dyspepsia, decreased appetite, and fatigue. Counseled patient on reducing meal size and how to titrate medication to minimize side effects. Counseled patient to call if intolerable side effects or if experiencing dehydration, abdominal pain, or dizziness. Patient will adhere to dietary modifications and will  target at least 150 minutes of moderate intensity exercise weekly.   Injection technique reviewed at today's visit and patient successfully self-administered first dose of *** into the fatty tissue of the abdomen.  Titration Plan:  Will plan to follow the titration plan as below, pending patient is tolerating each dose before increasing to the next. Can slow titration if needed for tolerability.    -Month 1: Inject *** SQ once weekly x 4 weeks -Month 2: Inject *** SQ once weekly x 4 weeks -Month 3: Inject *** SQ once weekly x 4 weeks -Month 4+: Inject *** SQ once weekly   Follow up in ***.

## 2022-01-29 NOTE — Telephone Encounter (Signed)
Called and spoke w/pt and they stated that the pcp placed hem on ozempic. Routing to dr. Brion Aliment to make her aware.

## 2022-02-01 ENCOUNTER — Encounter (INDEPENDENT_AMBULATORY_CARE_PROVIDER_SITE_OTHER): Payer: Medicare Other | Admitting: Cardiology

## 2022-02-01 ENCOUNTER — Encounter (HOSPITAL_COMMUNITY): Payer: Medicare Other

## 2022-02-01 DIAGNOSIS — G4733 Obstructive sleep apnea (adult) (pediatric): Secondary | ICD-10-CM | POA: Diagnosis not present

## 2022-02-05 ENCOUNTER — Ambulatory Visit: Payer: Medicare Other

## 2022-02-05 DIAGNOSIS — G4719 Other hypersomnia: Secondary | ICD-10-CM

## 2022-02-05 NOTE — Procedures (Signed)
   SLEEP STUDY REPORT  Patient Information Study Date: 02/01/22 Patient Name: [name] Patient ID: [id number] Birth Date: December 03, 2040 Age: 81 Gender: Female Referring Physician: Glori Bickers, MD  TEST DESCRIPTION: Home sleep apnea testing was completed using the WatchPat, a Type 1 device, utilizing  peripheral arterial tonometry (PAT), chest movement, actigraphy, pulse oximetry, pulse rate, body position and snore.  AHI was calculated with apnea and hypopnea using valid sleep time as the denominator. RDI includes apneas,  hypopneas, and RERAs. The data acquired and the scoring of sleep and all associated events were performed in  accordance with the recommended standards and specifications as outlined in the AASM Manual for the Scoring of  Sleep and Associated Events 2.2.0 (2015).  FINDINGS: 1. Severe Obstructive Sleep Apnea with AHI 31.1/hr.  2. No Central Sleep Apnea with pAHIc 3.9/hr. 3. Oxygen desaturations as low as 81%. 4. Mild snoring was present. O2 sats were < 88% for 2.4 min. 5. Total sleep time was 4 hrs and 5 min. 6. 10.9% of total sleep time was spent in REM sleep.  7. Shortened sleep onset latency at 6 min.  8. Normal REM sleep onset latency at 74 min.  9. Total awakenings were 35 .   DIAGNOSIS:  Severe Obstructive Sleep Apnea (G47.33)  RECOMMENDATIONS: 1. Clinical correlation of these findings is necessary. The decision to treat obstructive sleep apnea (OSA) is usually  based on the presence of apnea symptoms or the presence of associated medical conditions such as Hypertension,  Congestive Heart Failure, Atrial Fibrillation or Obesity. The most common symptoms of OSA are snoring, gasping for  breath while sleeping, daytime sleepiness and fatigue.   2. Initiating apnea therapy is recommended given the presence of symptoms and/or associated conditions.  Recommend proceeding with one of the following:    a. Auto-CPAP therapy with a pressure range of  5-20cm H2O.  b. An oral appliance (OA) that can be obtained from certain dentists with expertise in sleep medicine. These are  primarily of use in non-obese patients with mild and moderate disease.   c. An ENT consultation which may be useful to look for specific causes of obstruction and possible treatment  options.   d. If patient is intolerant to PAP therapy, consider referral to ENT for evaluation for hypoglossal nerve stimulator.   3. Close follow-up is necessary to ensure success with CPAP or oral appliance therapy for maximum benefit .  4. A follow-up oximetry study on CPAP is recommended to assess the adequacy of therapy and determine the need  for supplemental oxygen or the potential need for Bi-level therapy. An arterial blood gas to determine the adequacy of  baseline ventilation and oxygenation should also be considered.  5. Healthy sleep recommendations include: adequate nightly sleep (normal 7-9 hrs/night), avoidance of caffeine after  noon and alcohol near bedtime, and maintaining a sleep environment that is cool, dark and quiet.  6. Weight loss for overweight patients is recommended. Even modest amounts of weight loss can significantly  improve the severity of sleep apnea.  7. Snoring recommendations include: weight loss where appropriate, side sleeping, and avoidance of alcohol before  bed.  8. Operation of motor vehicle should be avoided when sleepy.  Signature: Electronically Signed: 02/05/22 Fransico Him, MD; [facility]; [employer], American Board of  Sleep Medicine

## 2022-02-09 ENCOUNTER — Telehealth: Payer: Self-pay | Admitting: *Deleted

## 2022-02-09 ENCOUNTER — Other Ambulatory Visit: Payer: Self-pay | Admitting: Cardiology

## 2022-02-09 DIAGNOSIS — G4733 Obstructive sleep apnea (adult) (pediatric): Secondary | ICD-10-CM

## 2022-02-09 NOTE — Telephone Encounter (Signed)
-----   Message from Sueanne Margarita, MD sent at 02/05/2022  1:45 PM EDT ----- Please let patient know that they have sleep apnea.  Recommend therapeutic CPAP titration for treatment of patient's sleep disordered breathing.  If unable to perform an in lab titration then initiate ResMed auto CPAP from 4 to 15cm H2O with heated humidity and mask of choice and overnight pulse ox on CPAP.

## 2022-02-09 NOTE — Telephone Encounter (Signed)
Called the patient and informed her of her sleep study results and recommendations. She declined to do CPAP at this time. States that she has tried this once before and due to being extremely claustrophobic, she feels that she just cannot do it. I told her that there's different types of masks. Maybe she can use a nasl mask. She states that she has tried those as well and  could not tolerate them either. She will contact a ENT to see how she can get qualified for inspire. She states that she is currently working on getting her weight down. Ordering provider will be notified of her decision.

## 2022-02-14 ENCOUNTER — Telehealth (HOSPITAL_COMMUNITY): Payer: Self-pay

## 2022-02-14 NOTE — Telephone Encounter (Signed)
Called to confirm/remind patient of their appointment at the [facility] on 02/16/22.   Patient reminded to bring all medications and/or complete list.  Confirmed patient has transportation. Gave directions, instructed to utilize [address] parking.  Confirmed appointment prior to ending call.

## 2022-02-14 NOTE — Telephone Encounter (Signed)
Called the patient to inform her per Dr Radford Pax that she would have to try CPAP for at least six months to even be considered for the inspire device, and she states that she knows that she cannot do that. She will just "do like I have been doing." Patient states that she has been sleeping in a recliner for about 10 years. She will continue on doing so. She is declining OSA treatment. Dr Radford Pax will be notified of the patient's decision.

## 2022-02-15 NOTE — Progress Notes (Incomplete)
Advanced [facility] Failure Clinic Note   Date:  02/15/2022   ID:  [name], DOB Sep 03, 1941, MRN [id number]  Location: Home  Provider location: [facility] Type of Visit: Established patient  PCP:  Caren Macadam, MD  Cardiologist:  Fransico Him, MD Nephrology: Dr. Justin Mend Primary HF: Bensimhon  Chief Complaint: Heart Failure follow-up   HPI: [name] is a 81 y.o.Mar[place] Kitchen female with history of diastolic heart failure, DM, GERD, colon cancer, CKD stage III, 2016 pacemaker (MDT) due to symptomatic sinus node dysfunction.  In 7/19, she had 12 pound weight and she was sent to the ED by her PCP. She was sent home on metolazone every other day for 14 days. She took 4 doses and her creatinine went up to 3 so metolazone was stopped.   R/L cath in 9/19. Normal cors. Elevated filling pressures with pulmonary venous HTN.   Had fall with R tibia fracture in 9/21    Having more dyspnea at follow up 2/22, arranged for RHC and repeat echo.  RHC (2/22) showed mild to moderate elevated filling pressures with prominent v waves in PCWP tracing, mild pulmonary HTN and high CO with no evidence of intracardiac shunting. Echo (4/22) showed EF 60-65%, RV ok, mild AS.  SCr remained elevated on lab check at EP visit 8/22 and irbesartan stopped.  Last seen for f/u 05/10. Appeared volume overloaded. More dyspneic and drinking a lot of fluid. Not wearing compression stockings or elevating legs. Once weekly metolazone added to Torsemide.   Here today for 4 week f/u.   Cardiac Studies: - Echo (5/23): EF 60-65% G1DD RV normal. RVSP 31  - Echo (4/22): EF 60-65%, RV ok, mild AS mean gradient 21.0 mmHg  - RHC (2/22):  RA = 9 RV = 49/13 PA = 49/17 (33) PCW = 22 ( v waves 28) Fick cardiac output/index = 8.0/3.9 PVR = 1.6 WU FA sat = 95% PA sat = 74%, 73% High SVC sat = 69%  Assessment: 1. Mild to moderately elevated filling pressures with prominent v waves in PCWP tracing  suggestive of diastolic dysfunction versus mitral regurgitation 2. Mild pulmonary venous HTN 3. High cardiac output with no evidence of intracardiac shunting  - PFTs 05/23/18: FEV1: 1.72 FEV1/FVC ratio: 77% DLCO: 70%  - R/LHC 05/19/18: Ao = 192/74 (118) LV = 201/26 RA = 14 RV = 53/18 PA = 53/19 (38) PCW = 27 (v = 41) Fick cardiac output/index = 5.6/2.8 PVR = 2.0 WU Ao sat = 95% PA sat = 64%, 66% Assessment: 1. Normal coronaries with ectactic vessels suggestive of longstanding HTN 2. Severe HTN 3. Normal LV function 4. Signficantly elevated filling pressures with pulmonary venous HTN in setting of holding diuretics for 2 days Plan/Discussion: Filling pressures and BP elevated. Will resume diuretics. Will need aggressive titration of ant-HTN regimen.  - Echo 05/14/18: - Left ventricle: Posterior basal and inferolateral hypokinesis The   cavity size was moderately dilated. Wall thickness was increased   in a pattern of mild LVH. Systolic function was normal. The   estimated ejection fraction was in the range of 50% to 55%. Left   ventricular diastolic function parameters were normal. - Aortic valve: Sclerosis without stenosis. - Mitral valve: Moderately calcified annulus. Moderately thickened,   mildly calcified leaflets . - Left atrium: The atrium was mildly dilated. - Atrial septum: No defect or patent foramen ovale was identified.   -Sleep study 10/19: AHI 49  - 06/21/2015 Myoview  Nuclear stress EF: 64%. There was no ST segment deviation noted during stress. The study is normal. This is a low risk study. No ischemia identified.  SH: Former 30-40 pack year smoker, quit 25 years ago.   Past Medical History:  Diagnosis Date   Anxiety    Arthritis    Cancer ([place])    colon   CHF (congestive heart failure) (HCC)    Chronic kidney disease    stage 3   Chronic pain syndrome    Diabetes (HCC)    Diabetic neuropathy (HCC)    Dyslipidemia    Early cataracts, bilateral     Fibromyalgia    GERD (gastroesophageal reflux disease)    H/O syncope    Heart murmur    Hypertension    Lumbar spinal stenosis    and scoliosis   Pneumonia    PONV (postoperative nausea and vomiting)    Presence of permanent cardiac pacemaker    Restless legs    Spinal headache    Thyroid nodule    Past Surgical History:  Procedure Laterality Date   APPENDECTOMY     BREAST SURGERY     lumpectomy   COLON SURGERY     DILATION AND CURETTAGE OF UTERUS     EP IMPLANTABLE DEVICE N/A 07/21/2015   Procedure: Pacemaker Implant;  Surgeon: Deboraha Sprang, MD;  Location: [facility];  Service: Cardiovascular;  Laterality: N/A;   LUMBAR LAMINECTOMY/DECOMPRESSION MICRODISCECTOMY N/A 02/02/2016   Procedure: DECOMPRESSION L4-L5 WITH INSITE 2 FUSION ;  Surgeon: Melina Schools, MD;  Location: [facility];  Service: Orthopedics;  Laterality: N/A;   PARTIAL NEPHRECTOMY     RIGHT HEART CATH N/A 11/02/2020   Procedure: RIGHT HEART CATH;  Surgeon: Jolaine Artist, MD;  Location: [facility];  Service: Cardiovascular;  Laterality: N/A;   RIGHT/LEFT HEART CATH AND CORONARY ANGIOGRAPHY N/A 05/19/2018   Procedure: RIGHT/LEFT HEART CATH AND CORONARY ANGIOGRAPHY;  Surgeon: Jolaine Artist, MD;  Location: [facility];  Service: Cardiovascular;  Laterality: N/A;   TIBIA IM NAIL INSERTION Right 05/19/2020   Procedure: INTRAMEDULLARY (IM) NAIL TIBIAL;  Surgeon: Nicholes Stairs, MD;  Location: [facility];  Service: Orthopedics;  Laterality: Right;   TONSILLECTOMY     TUBAL LIGATION     Current Outpatient Medications  Medication Sig Dispense Refill   ACCU-CHEK AVIVA PLUS test strip      Accu-Chek Softclix Lancets lancets      acetaminophen (TYLENOL) 650 MG CR tablet 2 tablets     amLODipine (NORVASC) 5 MG tablet Take 1 tablet (5 mg total) by mouth daily. 180 tablet 3   amLODipine (NORVASC) 5 MG tablet 1 tablet     B-D UF III MINI PEN NEEDLES 31G X 5 MM MISC      Blood Glucose Monitoring  Suppl (ACCU-CHEK AVIVA PLUS) w/Device KIT      calcium carbonate (OS-CAL) 600 MG TABS tablet Patient takes 1 tablet by mouth twice a week.     Cholecalciferol (VITAMIN D) 2000 UNITS tablet Take 2,000 Units by mouth daily.     Continuous Blood Gluc Sensor (FREESTYLE LIBRE 2 SENSOR) MISC See admin instructions.     Cromolyn Sodium (NASAL ALLERGY NA) Place 1 spray into the nose at bedtime as needed (sleep).     cyclobenzaprine (FLEXERIL) 10 MG tablet Take 10 mg by mouth 3 (three) times daily as needed for muscle spasms.     diclofenac Sodium (VOLTAREN) 1 % GEL Apply 1  application topically at bedtime as needed (sleep).     diphenhydrAMINE (BENADRYL) 25 MG tablet Take 25 mg by mouth daily as needed for itching or allergies.     DULoxetine (CYMBALTA) 20 MG capsule Take 20 mg by mouth daily.     empagliflozin (JARDIANCE) 10 MG TABS tablet Take 1 tablet (10 mg total) by mouth daily before breakfast. 90 tablet 3   gabapentin (NEURONTIN) 300 MG capsule Take 300 mg by mouth at bedtime.     hydrALAZINE (APRESOLINE) 100 MG tablet Take 1 tablet (100 mg total) by mouth 3 (three) times daily. 90 tablet 6   insulin degludec (TRESIBA FLEXTOUCH) 100 UNIT/ML FlexTouch Pen DIAL AND INJECT 75 UNITS UNDER THE SKIN ONCE A DAY     insulin degludec (TRESIBA FLEXTOUCH) 200 UNIT/ML FlexTouch Pen Inject 75 Units into the skin daily.     labetalol (NORMODYNE) 200 MG tablet TAKE ONE TABLET BY MOUTH TWICE A DAY 180 tablet 3   metolazone (ZAROXOLYN) 2.5 MG tablet Take 1 tablet (2.5 mg total) by mouth once a week. Every Friday 5 tablet 1   potassium chloride SA (KLOR-CON M) 20 MEQ tablet Take 1 tablet (20 mEq total) by mouth daily. Take extra tab on Friday with metolazone 35 tablet 3   pramipexole (MIRAPEX) 0.125 MG tablet Take 0.25 mg by mouth 2 (two) times daily.     rosuvastatin (CRESTOR) 10 MG tablet Take 10 mg by mouth daily.      torsemide (DEMADEX) 20 MG tablet Take 2 tablets (40 mg total) by mouth daily. 60 tablet 11    traMADol (ULTRAM) 50 MG tablet Take 2 tablets by mouth daily as needed. 4 times a day if     triamcinolone ointment (KENALOG) 0.1 % SMARTSIG:2 Topical Twice Daily     No current facility-administered medications for this visit.   Allergies:   Penicillins, Codeine, Adhesive [tape], Aspirin, Crab (diagnostic), Iodine, Ivp dye [iodinated contrast media], and Sulfa antibiotics   Social History:  The patient  reports that she quit smoking about 38 years ago. Her smoking use included cigarettes. She started smoking about 68 years ago. She has never used smokeless tobacco. She reports current alcohol use. She reports that she does not use drugs.   Family History:  The patient's family history includes CAD in an other family member; Sick sinus syndrome in her brother.   ROS:  Please see the history of present illness.   All other systems are personally reviewed and negative.   Recent Labs: 05/05/2021: Hemoglobin 12.5; Platelets 154 05/08/2021: NT-Pro BNP 596 10/20/2021: B Natriuretic Peptide 43.9 11/30/2021: BUN 80; Creatinine, Ser 2.38; Potassium 3.7; Sodium 136  Personally reviewed   Wt Readings from Last 3 Encounters:  01/17/22 98.6 kg (217 lb 6.4 oz)  11/17/21 96.3 kg (212 lb 3.2 oz)  10/20/21 96.3 kg (212 lb 6.4 oz)    There were no vitals taken for this visit.  Physical Exam:   General:  Elderly obese woman No resp difficulty HEENT: normal Neck: supple. no JVD. Carotids 2+ bilat; no bruits. No lymphadenopathy or thryomegaly appreciated. Cor: PMI nondisplaced. Regular rate & rhythm. 2/6 AS Lungs: clear Abdomen: obese soft, nontender, nondistended. No hepatosplenomegaly. No bruits or masses. Good bowel sounds. Extremities: no cyanosis, clubbing, rash, 3+ chronic edema Neuro: alert & orientedx3, cranial nerves grossly intact. moves all 4 extremities w/o difficulty. Affect pleasant   Device Interrogation (personally reviewed): 0.9 hr/day activity, no recent AF, no VT.  Assessment &  Plan:  1.  Acute on Chronic Diastolic Heart Failure  - Echo 05/14/18: EF 50-55%, posterior basal and inferolateral HK, mild LVH, LA mildly dilated - R/LHC 05/19/18: normal coronaries, elevated filling pressure (had held diuretics x2 days), severe HTN. - RHC (2/22): Mild to moderate elevated filling pressures, mild pulm venous HTN, high CO with no shunting. - Echo (4/22): EF 60-65%, RV ok, mild AS mean gradient 21.0 mmHg - Echo today 01/17/22 EF 60-65% G1DD RV normal. RVSP 31 - Stable NYHA IIIb, suspect she is limited body habitus (she was 105 pounds when she graduate HS) and general deconditioning but also has significant LE edema. (Which I suspect she also has severe venous insufficiency) - Long talk about need to comply with various strategies we have discussed multiple times including: fluid restriction, sliding-scale diuretics, compression hose, elevation, treatment of OSA - Volume status *** - Continue torsemide 40 mg daily + weekly metolazone with 40kcl.  - Continue Jardiance 10 mg daily. - No ARB/ARNi/spiro with SCr >2. - Check labs  2. HTN, severe - BP *** - Continue hydralazine 100 mg tid. - Continue labetalol 200 mg bid. - Continue amlodipine 5 mg daily.  3. Aortic Stenosis - Mild by echo 4/22, mean gradient 21 - Echo today 01/17/22 EF 60-65% G1DD RV normal. RVSP 31  4. OSA  - Sleep study 05/23 severe OSA with AHI 31 - Continues to refuse CPAP - Would be willing to consider Inspire device but must try CPAP for at least 6 months before she would qualify   5. Dyspnea - LHC 05/19/18 with normal coronaries. - PFTs with DLCO 70%. - RHC (2/22): Mild pulmonary venous hypertension - suspect she is limited body habitus (she was 105 pounds when she graduate HS) and general deconditioning but also has significant LE edema. (Which I suspect she also has severe venous insufficiency)  6. CKD Stage IV - Followed by [facility] - Baseline SCr 2.0-2.4  - On SGLT2i  - Labs today   7.  Sinus node dysfunction s/p MDT PPM - Follows with Dr Caryl Comes.   8. DM - Continue Jardiance. - No GU symptoms.  9. Obesity - Needs weight loss.  - There is no height or weight on file to calculate BMI.  - Referred for GLP1RA at last visit - she no showed appointment with pharmacist    Follow-up: [facility], PA-C  02/15/2022 4:40 PM  [facility] [place] [address] [phone] (office) [phone] (fax)

## 2022-02-16 ENCOUNTER — Encounter (HOSPITAL_COMMUNITY): Payer: Medicare Other

## 2022-02-21 DIAGNOSIS — M5416 Radiculopathy, lumbar region: Secondary | ICD-10-CM | POA: Diagnosis not present

## 2022-02-21 DIAGNOSIS — G894 Chronic pain syndrome: Secondary | ICD-10-CM | POA: Diagnosis not present

## 2022-02-21 DIAGNOSIS — M5459 Other low back pain: Secondary | ICD-10-CM | POA: Diagnosis not present

## 2022-03-12 ENCOUNTER — Telehealth (HOSPITAL_COMMUNITY): Payer: Self-pay

## 2022-03-12 NOTE — Telephone Encounter (Signed)
Called and was unable to leave patient a voice message to confirm/remind patient of their appointment at the [facility] on 03/14/22.

## 2022-03-14 ENCOUNTER — Encounter (HOSPITAL_COMMUNITY): Payer: Medicare Other

## 2022-04-20 ENCOUNTER — Telehealth: Payer: Self-pay | Admitting: Internal Medicine

## 2022-04-20 NOTE — Telephone Encounter (Signed)
Patient is calling stating her monitor is not sending in her transmission. It does not show a green arrow, there are question marks and orange lights. Please advise.

## 2022-04-20 NOTE — Telephone Encounter (Signed)
Spoke with patient with tech support on the line. Her monitor was not working because she doesn't have the cell adapter and her handheld life is depleted. They are sending her out a new one and it will take 7-10 business days

## 2022-04-23 ENCOUNTER — Telehealth (HOSPITAL_COMMUNITY): Payer: Self-pay

## 2022-04-23 NOTE — Telephone Encounter (Signed)
Called to confirm/remind patient of their appointment at the [facility] on 04/24/22.   Patient reminded to bring all medications and/or complete list.  Confirmed patient has transportation. Gave directions, instructed to utilize [place] parking.  Confirmed appointment prior to ending call.

## 2022-04-24 ENCOUNTER — Encounter (HOSPITAL_COMMUNITY): Payer: Medicare Other

## 2022-04-30 ENCOUNTER — Ambulatory Visit (INDEPENDENT_AMBULATORY_CARE_PROVIDER_SITE_OTHER): Payer: Medicare Other

## 2022-04-30 DIAGNOSIS — I5022 Chronic systolic (congestive) heart failure: Secondary | ICD-10-CM | POA: Diagnosis not present

## 2022-05-02 LAB — CUP PACEART REMOTE DEVICE CHECK
Battery Remaining Longevity: 43 mo
Battery Voltage: 2.97 V
Brady Statistic AP VP Percent: 0.08 %
Brady Statistic AP VS Percent: 99.65 %
Brady Statistic AS VP Percent: 0 %
Brady Statistic AS VS Percent: 0.27 %
Brady Statistic RA Percent Paced: 99.63 %
Brady Statistic RV Percent Paced: 0.09 %
Date Time Interrogation Session: 20230820140348
Implantable Lead Implant Date: 20161110
Implantable Lead Implant Date: 20161110
Implantable Lead Location: 753859
Implantable Lead Location: 753860
Implantable Lead Model: 5076
Implantable Lead Model: 5076
Implantable Pulse Generator Implant Date: 20161110
Lead Channel Impedance Value: 361 Ohm
Lead Channel Impedance Value: 361 Ohm
Lead Channel Impedance Value: 399 Ohm
Lead Channel Impedance Value: 437 Ohm
Lead Channel Pacing Threshold Amplitude: 0.625 V
Lead Channel Pacing Threshold Amplitude: 1.75 V
Lead Channel Pacing Threshold Pulse Width: 0.4 ms
Lead Channel Pacing Threshold Pulse Width: 0.4 ms
Lead Channel Sensing Intrinsic Amplitude: 3 mV
Lead Channel Sensing Intrinsic Amplitude: 3 mV
Lead Channel Sensing Intrinsic Amplitude: 4.25 mV
Lead Channel Sensing Intrinsic Amplitude: 4.25 mV
Lead Channel Setting Pacing Amplitude: 1.5 V
Lead Channel Setting Pacing Amplitude: 3.5 V
Lead Channel Setting Pacing Pulse Width: 0.4 ms
Lead Channel Setting Sensing Sensitivity: 2.8 mV

## 2022-05-04 ENCOUNTER — Telehealth (HOSPITAL_COMMUNITY): Payer: Self-pay | Admitting: Cardiology

## 2022-05-04 DIAGNOSIS — R600 Localized edema: Secondary | ICD-10-CM | POA: Diagnosis not present

## 2022-05-04 DIAGNOSIS — I509 Heart failure, unspecified: Secondary | ICD-10-CM | POA: Diagnosis not present

## 2022-05-04 DIAGNOSIS — N184 Chronic kidney disease, stage 4 (severe): Secondary | ICD-10-CM | POA: Diagnosis not present

## 2022-05-04 NOTE — Telephone Encounter (Signed)
Pt aware and voice understanding. Reports she was taking metolazone every week and recently stopped. Pt would like to keep upcoming apt 9/13-advised to return call 9/1 with an update.

## 2022-05-04 NOTE — Telephone Encounter (Signed)
Dr Mannie Stabile (PCP with [facility])) called with concerns with patients increased SOB and lower extremity swelling. Crackles heard in RLL upon exam Weight 213 today -reports compliance with medications.   Provider would like advice with fluid management to prevent ER visit in the near future   Please return call to PCP [phone] and patients daughter with recommendations

## 2022-05-15 DIAGNOSIS — N184 Chronic kidney disease, stage 4 (severe): Secondary | ICD-10-CM | POA: Diagnosis not present

## 2022-05-15 DIAGNOSIS — I509 Heart failure, unspecified: Secondary | ICD-10-CM | POA: Diagnosis not present

## 2022-05-15 DIAGNOSIS — E118 Type 2 diabetes mellitus with unspecified complications: Secondary | ICD-10-CM | POA: Diagnosis not present

## 2022-05-22 ENCOUNTER — Telehealth (HOSPITAL_COMMUNITY): Payer: Self-pay

## 2022-05-22 NOTE — Telephone Encounter (Signed)
Called to confirm/remind patient of their appointment at the [facility] on 05/23/22.   Patient reminded to bring all medications and/or complete list.  Confirmed patient has transportation. Gave directions, instructed to utilize [facility] parking.  Confirmed appointment prior to ending call.

## 2022-05-23 ENCOUNTER — Ambulatory Visit (HOSPITAL_COMMUNITY)
Admission: RE | Admit: 2022-05-23 | Discharge: 2022-05-23 | Disposition: A | Discharge status: Home / Self Care | Payer: Medicare Other | Source: Ambulatory Visit | Attending: Family Medicine | Admitting: Family Medicine

## 2022-05-23 ENCOUNTER — Encounter (HOSPITAL_COMMUNITY): Payer: Self-pay

## 2022-05-23 VITALS — BP 126/60 | HR 77 | Wt 211.6 lb

## 2022-05-23 DIAGNOSIS — I10 Essential (primary) hypertension: Secondary | ICD-10-CM | POA: Diagnosis not present

## 2022-05-23 DIAGNOSIS — I495 Sick sinus syndrome: Secondary | ICD-10-CM

## 2022-05-23 DIAGNOSIS — Z79899 Other long term (current) drug therapy: Secondary | ICD-10-CM | POA: Insufficient documentation

## 2022-05-23 DIAGNOSIS — K219 Gastro-esophageal reflux disease without esophagitis: Secondary | ICD-10-CM | POA: Diagnosis not present

## 2022-05-23 DIAGNOSIS — I350 Nonrheumatic aortic (valve) stenosis: Secondary | ICD-10-CM | POA: Diagnosis not present

## 2022-05-23 DIAGNOSIS — E1122 Type 2 diabetes mellitus with diabetic chronic kidney disease: Secondary | ICD-10-CM | POA: Insufficient documentation

## 2022-05-23 DIAGNOSIS — N184 Chronic kidney disease, stage 4 (severe): Secondary | ICD-10-CM

## 2022-05-23 DIAGNOSIS — F40240 Claustrophobia: Secondary | ICD-10-CM | POA: Insufficient documentation

## 2022-05-23 DIAGNOSIS — I130 Hypertensive heart and chronic kidney disease with heart failure and stage 1 through stage 4 chronic kidney disease, or unspecified chronic kidney disease: Secondary | ICD-10-CM | POA: Diagnosis not present

## 2022-05-23 DIAGNOSIS — Z794 Long term (current) use of insulin: Secondary | ICD-10-CM

## 2022-05-23 DIAGNOSIS — E669 Obesity, unspecified: Secondary | ICD-10-CM | POA: Insufficient documentation

## 2022-05-23 DIAGNOSIS — Z6837 Body mass index (BMI) 37.0-37.9, adult: Secondary | ICD-10-CM | POA: Diagnosis not present

## 2022-05-23 DIAGNOSIS — I5033 Acute on chronic diastolic (congestive) heart failure: Secondary | ICD-10-CM | POA: Diagnosis not present

## 2022-05-23 DIAGNOSIS — I5032 Chronic diastolic (congestive) heart failure: Secondary | ICD-10-CM | POA: Diagnosis not present

## 2022-05-23 DIAGNOSIS — G4733 Obstructive sleep apnea (adult) (pediatric): Secondary | ICD-10-CM | POA: Diagnosis not present

## 2022-05-23 DIAGNOSIS — R0600 Dyspnea, unspecified: Secondary | ICD-10-CM | POA: Diagnosis not present

## 2022-05-23 DIAGNOSIS — Z7984 Long term (current) use of oral hypoglycemic drugs: Secondary | ICD-10-CM | POA: Diagnosis not present

## 2022-05-23 DIAGNOSIS — Z85038 Personal history of other malignant neoplasm of large intestine: Secondary | ICD-10-CM | POA: Diagnosis not present

## 2022-05-23 DIAGNOSIS — E1136 Type 2 diabetes mellitus with diabetic cataract: Secondary | ICD-10-CM | POA: Insufficient documentation

## 2022-05-23 DIAGNOSIS — I272 Pulmonary hypertension, unspecified: Secondary | ICD-10-CM | POA: Diagnosis not present

## 2022-05-23 DIAGNOSIS — N898 Other specified noninflammatory disorders of vagina: Secondary | ICD-10-CM | POA: Diagnosis not present

## 2022-05-23 DIAGNOSIS — N1832 Chronic kidney disease, stage 3b: Secondary | ICD-10-CM | POA: Diagnosis not present

## 2022-05-23 LAB — BASIC METABOLIC PANEL
Anion gap: 4 — ABNORMAL LOW (ref 5–15)
BUN: 30 mg/dL — ABNORMAL HIGH (ref 8–23)
CO2: 37 mmol/L — ABNORMAL HIGH (ref 22–32)
Calcium: 8.9 mg/dL (ref 8.9–10.3)
Chloride: 100 mmol/L (ref 98–111)
Creatinine, Ser: 1.62 mg/dL — ABNORMAL HIGH (ref 0.44–1.00)
GFR, Estimated: 31 mL/min — ABNORMAL LOW (ref >60)
Glucose, Bld: 104 mg/dL — ABNORMAL HIGH (ref 70–99)
Potassium: 3.5 mmol/L (ref 3.5–5.1)
Sodium: 141 mmol/L (ref 135–145)

## 2022-05-23 LAB — BRAIN NATRIURETIC PEPTIDE: B Natriuretic Peptide: 67.7 pg/mL (ref 0.0–100.0)

## 2022-05-23 MED ORDER — POTASSIUM CHLORIDE CRYS ER 20 MEQ PO TBCR: 20.0000 meq | EXTENDED_RELEASE_TABLET | Freq: Every day | ORAL | 11 refills | Status: DC

## 2022-05-23 MED ORDER — METOLAZONE 2.5 MG PO TABS: 2.5000 mg | ORAL_TABLET | ORAL | 3 refills | Status: DC

## 2022-05-23 NOTE — Progress Notes (Signed)
Advanced Heart Failure Clinic Note   Date:  05/23/2022   ID:  [name], DOB [birth year], MRN [id number]  Location: Home  Provider location: [facility] Type of Visit: Established patient  PCP:  Caren Macadam, MD  Cardiologist:  Fransico Him, MD Nephrology: Dr. Royce Macadamia HF Cardiologist: Dr. Haroldine Laws  Chief Complaint: Heart Failure follow-up   HPI: [name] is a 81 y.o.Marland Kitchen female with history of diastolic heart failure, DM, GERD, colon cancer, CKD stage III, 2016 pacemaker (MDT) due to symptomatic sinus node dysfunction.  In 7/19, she had 12 pound weight and she was sent to the ED by her PCP. She was sent home on metolazone every other day for 14 days. She took 4 doses and her creatinine went up to 3 so metolazone was stopped.   R/L cath in 9/19. Normal cors. Elevated filling pressures with pulmonary venous HTN.   Had fall with R tibia fracture in 9/21    Having more dyspnea at follow up 2/22, arranged for RHC and repeat echo.  RHC (2/22) showed mild to moderate elevated filling pressures with prominent v waves in PCWP tracing, mild pulmonary HTN and high CO with no evidence of intracardiac shunting. Echo (4/22) showed EF 60-65%, RV ok, mild AS.  SCr remained elevated on lab check at EP visit 8/22 and irbesartan stopped.  Echo 01/17/22 EF 60-65% G1DD RV normal. RVSP 31  Today she returns for HF follow up with daughter. Overall feeling fine. She is SOB walking on flat ground with her cane, stable over past year.  She is able to complete ADLs if she takes her time. Continues with LE swelling and wears compression hose. Has not been taking metolazone weekly on a regular basis, as she was only given 5 tablets. Denies palpitations, CP, dizziness, or PND/Orthopnea. Chronically sleeps in recliner. Appetite ok. No fever or chills. Weight at home 205 pounds. Unable to tolerate CPAP. Has some vaginal irritation, does not feel like yeast infection, but  noticeable.   Cardiac Studies: - Echo (5/23): EF 60-65%, grade I DD, RV normal, RVSP 31  - Echo (4/22): EF 60-65%, RV ok, mild AS mean gradient 21.0 mmHg  - RHC (2/22):  RA = 9 RV = 49/13 PA = 49/17 (33) PCW = 22 ( v waves 28) Fick cardiac output/index = 8.0/3.9 PVR = 1.6 WU FA sat = 95% PA sat = 74%, 73% High SVC sat = 69%  Assessment: 1. Mild to moderately elevated filling pressures with prominent v waves in PCWP tracing suggestive of diastolic dysfunction versus mitral regurgitation 2. Mild pulmonary venous HTN 3. High cardiac output with no evidence of intracardiac shunting  - PFTs 05/23/18: FEV1: 1.72 FEV1/FVC ratio: 77% DLCO: 70%  - R/LHC 05/19/18: Ao = 192/74 (118) LV = 201/26 RA = 14 RV = 53/18 PA = 53/19 (38) PCW = 27 (v = 41) Fick cardiac output/index = 5.6/2.8 PVR = 2.0 WU Ao sat = 95% PA sat = 64%, 66% Assessment: 1. Normal coronaries with ectactic vessels suggestive of longstanding HTN 2. Severe HTN 3. Normal LV function 4. Signficantly elevated filling pressures with pulmonary venous HTN in setting of holding diuretics for 2 days Plan/Discussion: Filling pressures and BP elevated. Will resume diuretics. Will need aggressive titration of ant-HTN regimen.  - Echo 05/14/18: EF 50-55%, mild LVH - Aortic valve: Sclerosis without stenosis. - Mitral valve: Moderately calcified annulus. Moderately thickened,   mildly calcified leaflets . - Left atrium: The atrium  was mildly dilated. - Atrial septum: No defect or patent foramen ovale was identified.   -Sleep study (10/19): AHI 49  - Myoview (06/21/15): Nuclear stress EF: 64%. There was no ST segment deviation noted during stress. The study is normal. This is a low risk study. No ischemia identified.  SH: Former 30-40 pack year smoker, quit 25 years ago.   Past Medical History:  Diagnosis Date   Anxiety    Arthritis    Cancer ([place])    colon   CHF (congestive heart failure) (HCC)    Chronic kidney  disease    stage 3   Chronic pain syndrome    Diabetes (HCC)    Diabetic neuropathy (HCC)    Dyslipidemia    Early cataracts, bilateral    Fibromyalgia    GERD (gastroesophageal reflux disease)    H/O syncope    Heart murmur    Hypertension    Lumbar spinal stenosis    and scoliosis   Pneumonia    PONV (postoperative nausea and vomiting)    Presence of permanent cardiac pacemaker    Restless legs    Spinal headache    Thyroid nodule    Past Surgical History:  Procedure Laterality Date   APPENDECTOMY     BREAST SURGERY     lumpectomy   COLON SURGERY     DILATION AND CURETTAGE OF UTERUS     EP IMPLANTABLE DEVICE N/A 07/21/2015   Procedure: Pacemaker Implant;  Surgeon: Deboraha Sprang, MD;  Location: [facility];  Service: Cardiovascular;  Laterality: N/A;   LUMBAR LAMINECTOMY/DECOMPRESSION MICRODISCECTOMY N/A 02/02/2016   Procedure: DECOMPRESSION L4-L5 WITH INSITE 2 FUSION ;  Surgeon: Melina Schools, MD;  Location: [facility];  Service: Orthopedics;  Laterality: N/A;   PARTIAL NEPHRECTOMY     RIGHT HEART CATH N/A 11/02/2020   Procedure: RIGHT HEART CATH;  Surgeon: Jolaine Artist, MD;  Location: [facility];  Service: Cardiovascular;  Laterality: N/A;   RIGHT/LEFT HEART CATH AND CORONARY ANGIOGRAPHY N/A 05/19/2018   Procedure: RIGHT/LEFT HEART CATH AND CORONARY ANGIOGRAPHY;  Surgeon: Jolaine Artist, MD;  Location: [facility];  Service: Cardiovascular;  Laterality: N/A;   TIBIA IM NAIL INSERTION Right 05/19/2020   Procedure: INTRAMEDULLARY (IM) NAIL TIBIAL;  Surgeon: Nicholes Stairs, MD;  Location: [facility];  Service: Orthopedics;  Laterality: Right;   TONSILLECTOMY     TUBAL LIGATION     Current Outpatient Medications  Medication Sig Dispense Refill   ACCU-CHEK AVIVA PLUS test strip      Accu-Chek Softclix Lancets lancets      acetaminophen (TYLENOL) 650 MG CR tablet 2 tablets     amLODipine (NORVASC) 5 MG tablet Take 1 tablet (5 mg total) by mouth  daily. 180 tablet 3   B-D UF III MINI PEN NEEDLES 31G X 5 MM MISC      Blood Glucose Monitoring Suppl (ACCU-CHEK AVIVA PLUS) w/Device KIT      calcium carbonate (OS-CAL) 600 MG TABS tablet Patient takes 1 tablet by mouth twice a week.     Cholecalciferol (VITAMIN D) 2000 UNITS tablet Take 2,000 Units by mouth daily.     Continuous Blood Gluc Sensor (FREESTYLE LIBRE 2 SENSOR) MISC See admin instructions.     Cromolyn Sodium (NASAL ALLERGY NA) Place 1 spray into the nose at bedtime as needed (sleep).     cyclobenzaprine (FLEXERIL) 10 MG tablet Take 10 mg by mouth 3 (three) times daily as needed for  muscle spasms.     diclofenac Sodium (VOLTAREN) 1 % GEL Apply 1 application topically at bedtime as needed (sleep).     diphenhydrAMINE (BENADRYL) 25 MG tablet Take 25 mg by mouth daily as needed for itching or allergies.     DULoxetine (CYMBALTA) 20 MG capsule Take 20 mg by mouth daily.     empagliflozin (JARDIANCE) 10 MG TABS tablet Take 1 tablet (10 mg total) by mouth daily before breakfast. 90 tablet 3   gabapentin (NEURONTIN) 300 MG capsule Take 300 mg by mouth at bedtime.     hydrALAZINE (APRESOLINE) 100 MG tablet Take 1 tablet (100 mg total) by mouth 3 (three) times daily. 90 tablet 6   insulin degludec (TRESIBA FLEXTOUCH) 100 UNIT/ML FlexTouch Pen DIAL AND INJECT 75 UNITS UNDER THE SKIN ONCE A DAY     insulin degludec (TRESIBA FLEXTOUCH) 200 UNIT/ML FlexTouch Pen Inject 75 Units into the skin daily.     labetalol (NORMODYNE) 200 MG tablet TAKE ONE TABLET BY MOUTH TWICE A DAY 180 tablet 3   metolazone (ZAROXOLYN) 2.5 MG tablet Take 1 tablet (2.5 mg total) by mouth once a week. Every Friday 5 tablet 1   OZEMPIC, 0.25 OR 0.5 MG/DOSE, 2 MG/3ML SOPN Inject into the skin.     potassium chloride SA (KLOR-CON M) 20 MEQ tablet Take 1 tablet (20 mEq total) by mouth daily. Take extra tab on Friday with metolazone 35 tablet 3   pramipexole (MIRAPEX) 0.125 MG tablet Take 0.25 mg by mouth 2 (two) times daily.      rosuvastatin (CRESTOR) 10 MG tablet Take 10 mg by mouth daily.      torsemide (DEMADEX) 20 MG tablet Take 2 tablets (40 mg total) by mouth daily. 60 tablet 11   traMADol (ULTRAM) 50 MG tablet Take 2 tablets by mouth daily as needed. 4 times a day if     triamcinolone ointment (KENALOG) 0.1 % SMARTSIG:2 Topical Twice Daily     No current facility-administered medications for this encounter.   Allergies:   Penicillins, Codeine, Adhesive [tape], Aspirin, Crab (diagnostic), Iodine, Ivp dye [iodinated contrast media], and Sulfa antibiotics   Social History:  The patient  reports that she quit smoking about 38 years ago. Her smoking use included cigarettes. She started smoking about 68 years ago. She has never used smokeless tobacco. She reports current alcohol use. She reports that she does not use drugs.   Family History:  The patient's family history includes CAD in an other family member; Sick sinus syndrome in her brother.   ROS:  Please see the history of present illness.   All other systems are personally reviewed and negative.   Recent Labs: 10/20/2021: B Natriuretic Peptide 43.9 11/30/2021: BUN 80; Creatinine, Ser 2.38; Potassium 3.7; Sodium 136  Personally reviewed   Wt Readings from Last 3 Encounters:  05/23/22 96 kg (211 lb 9.6 oz)  01/17/22 98.6 kg (217 lb 6.4 oz)  11/17/21 96.3 kg (212 lb 3.2 oz)    BP 126/60   Pulse 77   Wt 96 kg (211 lb 9.6 oz)   SpO2 92%   BMI 37.48 kg/m   Physical Exam:   General:  NAD. No resp difficulty, walked into clinic. HEENT: Normal Neck: Supple. No JVD. Carotids 2+ bilat; no bruits. No lymphadenopathy or thryomegaly appreciated. Cor: PMI nondisplaced. Regular rate & rhythm. No rubs, gallops, 2/6 AS Lungs: Clear Abdomen: Obese, soft, nontender, nondistended. No hepatosplenomegaly. No bruits or masses. Good bowel sounds. Extremities: No cyanosis,  clubbing, rash, 2-3+ chronic BLE edema, wearing compression hose. Neuro: Alert & oriented x 3,  cranial nerves grossly intact. Moves all 4 extremities w/o difficulty. Affect pleasant.  Assessment & Plan: 1. Acute on Chronic Diastolic Heart Failure  - Echo 05/14/18: EF 50-55%, posterior basal and inferolateral HK, mild LVH, LA mildly dilated - R/LHC 05/19/18: normal coronaries, elevated filling pressure (had held diuretics x2 days), severe HTN. - RHC (2/22): Mild to moderate elevated filling pressures, mild pulm venous HTN, high CO with no shunting. - Echo (4/22): EF 60-65%, RV ok, mild AS mean gradient 21.0 mmHg - Echo (01/17/22): EF 60-65% G1DD RV normal. RVSP 31 - Stable NYHA IIIb, suspect she is limited body habitus (she was 105 pounds when she graduate HS) and general deconditioning but also has significant LE edema. (Which I suspect she also has severe venous insufficiency) - Long talk about need to comply with various strategies we have discussed multiple times including: fluid restriction, sliding-scale diuretics, compression hose, elevation, treatment of OSA. - Volume looks OK today. - Continue torsemide 40 mg daily + weekly metolazone with 40kcl. Will make sure she has refills.  - Continue Jardiance 10 mg daily. Watch for GU issues. - No ARB/ARNi/spiro with SCr >2. - Labs today.  2. HTN, severe - Well controlled. - Continue hydralazine 100 mg tid. - Continue labetalol 200 mg bid. - Continue amlodipine 5 mg daily.  3. Aortic Stenosis - Mild by echo 4/22, mean gradient 21 - Echo 01/17/22 EF 60-65% G1DD RV normal. RVSP 31  4. OSA  - Severe, AHI 49 by PSG in 10/19 - f/u testing AHI 11 - Had televisit with Dr. Radford Pax on 09/30/19. She refused CPAP due to claustrophobia.  - Continues to refuse CPAP - Would be willing to consider Inspire device - Repeat HST showed severe, AHI 31.1. Refer back to Dr. Radford Pax to discuss options.   5. Dyspnea - LHC 05/19/18 with normal coronaries. - PFTs with DLCO 70%. - RHC (2/22): Mild pulmonary venous hypertension - Suspect she is limited body  habitus (she was 105 pounds when she graduate HS) and general deconditioning but also has significant LE edema. (Which I suspect she also has severe venous insufficiency)  6. CKD Stage IV - Followed byCKD  - Baseline SCr 2.0-2.4  - On SGLT2i  - Labs today. Will forward to Dr. Royce Macadamia.    7. Sinus node dysfunction s/p MDT PPM - Follows with Dr Caryl Comes.   8. DM - Continue Jardiance. - I asked her to notify clinic if she has GU symptoms.  9. Obesity - Needs weight loss.  - Body mass index is 37.48 kg/m.  - Continue Ozempic.  10. Vaginal irritation - Try unscented Dove soap and can try OTC Monistat cream to irritated area. - Needs PCP follow up if not resolving. - Would stop SGLT2i if issues persist.  Follow up in 3-4 months with Dr. Haroldine Laws.   Emlyn, [place]  05/23/2022 11:43 AM  Advanced Heart Failure [place] [address] [phone] (office) [phone] (fax)

## 2022-05-23 NOTE — Patient Instructions (Signed)
Labs done today. We will contact you only if your labs are abnormal.  Continue Metolazone weekly with extra 2 tablets of potassium.   No other medication changes were made. Please continue all current medications as prescribed.  You have been referred to Dr. Radford Pax. Her office will contact you to schedule an appointment.   Try unscented white dove soap  and monistat cream.  Your physician recommends that you schedule a follow-up appointment in: 3-4 months with Dr. Haroldine Laws. Please contact our office in December to schedule a January 2023 appointment.   If you have any questions or concerns before your next appointment please send [place] a message through [place] or call our office at [phone].    TO LEAVE A MESSAGE FOR THE NURSE SELECT OPTION 2, PLEASE LEAVE A MESSAGE INCLUDING: YOUR NAME DATE OF BIRTH CALL BACK NUMBER REASON FOR CALL**this is important as we prioritize the call backs  YOU WILL RECEIVE A CALL BACK THE SAME DAY AS LONG AS YOU CALL BEFORE 4:00 PM   Do the following things EVERYDAY: Weigh yourself in the morning before breakfast. Write it down and keep it in a log. Take your medicines as prescribed Eat low salt foods--Limit salt (sodium) to 2000 mg per day.  Stay as active as you can everyday Limit all fluids for the day to less than 2 liters   At the [facility], you and your health needs are our priority. As part of our continuing mission to provide you with exceptional heart care, we have created designated Provider Care Teams. These Care Teams include your primary Cardiologist (physician) and Advanced Practice Providers (APPs- Physician Assistants and Nurse Practitioners) who all work together to provide you with the care you need, when you need it.   You may see any of the following providers on your designated Care Team at your next follow up: Dr Glori Bickers Dr Haynes Kerns, NP Lyda Jester, [place] Audry Riles,  PharmD   Please be sure to bring in all your medications bottles to every appointment.

## 2022-05-27 NOTE — Progress Notes (Signed)
Remote pacemaker transmission.   

## 2022-05-30 DIAGNOSIS — E1122 Type 2 diabetes mellitus with diabetic chronic kidney disease: Secondary | ICD-10-CM | POA: Diagnosis not present

## 2022-05-30 DIAGNOSIS — N184 Chronic kidney disease, stage 4 (severe): Secondary | ICD-10-CM | POA: Diagnosis not present

## 2022-05-30 DIAGNOSIS — E876 Hypokalemia: Secondary | ICD-10-CM | POA: Diagnosis not present

## 2022-05-30 DIAGNOSIS — I5030 Unspecified diastolic (congestive) heart failure: Secondary | ICD-10-CM | POA: Diagnosis not present

## 2022-05-30 DIAGNOSIS — I129 Hypertensive chronic kidney disease with stage 1 through stage 4 chronic kidney disease, or unspecified chronic kidney disease: Secondary | ICD-10-CM | POA: Diagnosis not present

## 2022-06-01 ENCOUNTER — Ambulatory Visit: Payer: Medicare Other | Admitting: Nurse Practitioner

## 2022-06-04 ENCOUNTER — Other Ambulatory Visit (HOSPITAL_COMMUNITY): Payer: Self-pay | Admitting: *Deleted

## 2022-06-04 MED ORDER — METOLAZONE 2.5 MG PO TABS: 2.5000 mg | ORAL_TABLET | ORAL | 3 refills | Status: DC

## 2022-06-20 DIAGNOSIS — M5459 Other low back pain: Secondary | ICD-10-CM | POA: Diagnosis not present

## 2022-06-20 DIAGNOSIS — K5903 Drug induced constipation: Secondary | ICD-10-CM | POA: Diagnosis not present

## 2022-06-20 DIAGNOSIS — Z5181 Encounter for therapeutic drug level monitoring: Secondary | ICD-10-CM | POA: Diagnosis not present

## 2022-06-20 DIAGNOSIS — M961 Postlaminectomy syndrome, not elsewhere classified: Secondary | ICD-10-CM | POA: Diagnosis not present

## 2022-06-20 DIAGNOSIS — Z79899 Other long term (current) drug therapy: Secondary | ICD-10-CM | POA: Diagnosis not present

## 2022-06-20 DIAGNOSIS — G894 Chronic pain syndrome: Secondary | ICD-10-CM | POA: Diagnosis not present

## 2022-07-05 ENCOUNTER — Other Ambulatory Visit: Payer: Medicare PPO

## 2022-07-05 ENCOUNTER — Other Ambulatory Visit: Payer: Self-pay | Admitting: Internal Medicine

## 2022-07-27 ENCOUNTER — Telehealth: Payer: Self-pay | Admitting: *Deleted

## 2022-07-27 ENCOUNTER — Ambulatory Visit: Payer: Medicare Other | Admitting: Cardiovascular Disease

## 2022-07-27 NOTE — Progress Notes (Deleted)
No chief complaint on file.  History of Present Illness: 81 yo female with history of colon cancer, CKD stage 3, DM, hyperlipidemia, GERD, fibromyalgia, sinus node dysfunction s/p pacemaker placement, chronic diastolic CHF and HTN who is added onto my DOD schedule today for cardiac follow up. She is followed in our office by Dr. Caryl Comes and in the CHF clinic by Dr. Haroldine Laws. Echo in May 2023 with LVEF=60-65% with grade 1 diastolic dysfunction. Mild aortic stenosis. Cardiac cath in 2019 with normal coronary arteries. She is followed closely in our advanced heart failure clinic. She was seen there in September 2023. She has been on Torsemide 40 mg daily with weekly metolazone. She has not been on an ARB or aldactone with CKD.   Primary Care Physician: Caren Macadam, MD  Primary Cardiologist: Dr. Caryl Comes  Past Medical History:  Diagnosis Date   Anxiety    Arthritis    Cancer [facility])    colon   CHF (congestive heart failure) ([place])    Chronic kidney disease    stage 3   Chronic pain syndrome    Diabetes ([place])    Diabetic neuropathy ([place])    Dyslipidemia    Early cataracts, bilateral    Fibromyalgia    GERD (gastroesophageal reflux disease)    H/O syncope    Heart murmur    Hypertension    Lumbar spinal stenosis    and scoliosis   Pneumonia    PONV (postoperative nausea and vomiting)    Presence of permanent cardiac pacemaker    Restless legs    Spinal headache    Thyroid nodule     Past Surgical History:  Procedure Laterality Date   APPENDECTOMY     BREAST SURGERY     lumpectomy   COLON SURGERY     DILATION AND CURETTAGE OF UTERUS     EP IMPLANTABLE DEVICE N/A 07/21/2015   Procedure: Pacemaker Implant;  Surgeon: Deboraha Sprang, MD;  Location: [facility];  Service: Cardiovascular;  Laterality: N/A;   LUMBAR LAMINECTOMY/DECOMPRESSION MICRODISCECTOMY N/A 02/02/2016   Procedure: DECOMPRESSION L4-L5 WITH INSITE 2 FUSION ;  Surgeon: Melina Schools, MD;  Location: [facility];   Service: Orthopedics;  Laterality: N/A;   PARTIAL NEPHRECTOMY     RIGHT HEART CATH N/A 11/02/2020   Procedure: RIGHT HEART CATH;  Surgeon: Jolaine Artist, MD;  Location: [facility];  Service: Cardiovascular;  Laterality: N/A;   RIGHT/LEFT HEART CATH AND CORONARY ANGIOGRAPHY N/A 05/19/2018   Procedure: RIGHT/LEFT HEART CATH AND CORONARY ANGIOGRAPHY;  Surgeon: Jolaine Artist, MD;  Location: [facility];  Service: Cardiovascular;  Laterality: N/A;   TIBIA IM NAIL INSERTION Right 05/19/2020   Procedure: INTRAMEDULLARY (IM) NAIL TIBIAL;  Surgeon: Nicholes Stairs, MD;  Location: [facility];  Service: Orthopedics;  Laterality: Right;   TONSILLECTOMY     TUBAL LIGATION      Current Outpatient Medications  Medication Sig Dispense Refill   ACCU-CHEK AVIVA PLUS test strip      Accu-Chek Softclix Lancets lancets      acetaminophen (TYLENOL) 650 MG CR tablet 2 tablets     amLODipine (NORVASC) 5 MG tablet Take 1 tablet (5 mg total) by mouth daily. 180 tablet 3   B-D UF III MINI PEN NEEDLES 31G X 5 MM MISC      Blood Glucose Monitoring Suppl (ACCU-CHEK AVIVA PLUS) w/Device KIT      calcium carbonate (OS-CAL) 600 MG TABS tablet Patient takes 1  tablet by mouth twice a week.     Cholecalciferol (VITAMIN D) 2000 UNITS tablet Take 2,000 Units by mouth daily.     Continuous Blood Gluc Sensor (FREESTYLE LIBRE 2 SENSOR) MISC See admin instructions.     Cromolyn Sodium (NASAL ALLERGY NA) Place 1 spray into the nose at bedtime as needed (sleep).     cyclobenzaprine (FLEXERIL) 10 MG tablet Take 10 mg by mouth 3 (three) times daily as needed for muscle spasms.     diclofenac Sodium (VOLTAREN) 1 % GEL Apply 1 application topically at bedtime as needed (sleep).     diphenhydrAMINE (BENADRYL) 25 MG tablet Take 25 mg by mouth daily as needed for itching or allergies.     DULoxetine (CYMBALTA) 20 MG capsule Take 20 mg by mouth daily.     empagliflozin (JARDIANCE) 10 MG TABS tablet Take 1 tablet (10  mg total) by mouth daily before breakfast. 90 tablet 3   gabapentin (NEURONTIN) 300 MG capsule Take 300 mg by mouth at bedtime.     hydrALAZINE (APRESOLINE) 100 MG tablet Take 1 tablet (100 mg total) by mouth 3 (three) times daily. 90 tablet 6   insulin degludec (TRESIBA FLEXTOUCH) 100 UNIT/ML FlexTouch Pen DIAL AND INJECT 75 UNITS UNDER THE SKIN ONCE A DAY     insulin degludec (TRESIBA FLEXTOUCH) 200 UNIT/ML FlexTouch Pen Inject 75 Units into the skin daily.     labetalol (NORMODYNE) 200 MG tablet TAKE ONE TABLET BY MOUTH TWICE A DAY 180 tablet 3   metolazone (ZAROXOLYN) 2.5 MG tablet Take 1 tablet (2.5 mg total) by mouth once a week. Every Friday 12 tablet 3   OZEMPIC, 0.25 OR 0.5 MG/DOSE, 2 MG/3ML SOPN Inject into the skin.     potassium chloride SA (KLOR-CON M) 20 MEQ tablet Take 1 tablet (20 mEq total) by mouth daily. Take 2 extra tab on Friday with metolazone 40 tablet 11   pramipexole (MIRAPEX) 0.125 MG tablet Take 0.25 mg by mouth 2 (two) times daily.     rosuvastatin (CRESTOR) 10 MG tablet Take 10 mg by mouth daily.      torsemide (DEMADEX) 20 MG tablet Take 2 tablets (40 mg total) by mouth daily. 60 tablet 11   traMADol (ULTRAM) 50 MG tablet Take 2 tablets by mouth daily as needed. 4 times a day if     triamcinolone ointment (KENALOG) 0.1 % SMARTSIG:2 Topical Twice Daily     No current facility-administered medications for this visit.    Allergies  Allergen Reactions   Penicillins Diarrhea and Other (See Comments)    Tolerated Cephalosporin Date: 05/19/20.    Severe diarrhea Has patient had a PCN reaction causing immediate rash, facial/tongue/throat swelling, SOB or lightheadedness with hypotension: No Has patient had a PCN reaction causing severe rash involving mucus membranes or skin necrosis: No Has patient had a PCN reaction that required hospitalization No Has patient had a PCN reaction occurring within the last 10 years: No If all of the above answers are "NO", then may  proceed with Cephalosporin use.    Codeine Other (See Comments)    Unknown   Adhesive [Tape] Rash   Aspirin Other (See Comments)    Stomach burning   Crab (Diagnostic) Rash   Iodine Itching, Swelling, Rash and Other (See Comments)    Can use if Benadryl and Prednisone are used first    Ivp Dye [Iodinated Contrast Media] Other (See Comments)    Can use if Benadryl and Prednisone are used first  Sulfa Antibiotics Diarrhea    Social History   Socioeconomic History   Marital status: Widowed    Spouse name: Not on file   Number of children: Not on file   Years of education: Not on file   Highest education level: Not on file  Occupational History   Not on file  Tobacco Use   Smoking status: Former    Types: Cigarettes    Start date: 09/10/1953    Quit date: 09/11/1983    Years since quitting: 38.9   Smokeless tobacco: Never  Vaping Use   Vaping Use: Never used  Substance and Sexual Activity   Alcohol use: Yes    Alcohol/week: 0.0 standard drinks of alcohol    Comment: rare glass of wine   Drug use: No   Sexual activity: Not on file  Other Topics Concern   Not on file  Social History Narrative   Not on file   Social Determinants of Health   Financial Resource Strain: Not on file  Food Insecurity: Not on file  Transportation Needs: Not on file  Physical Activity: Not on file  Stress: Not on file  Social Connections: Not on file  Intimate Partner Violence: Not on file    Family History  Problem Relation Age of Onset   CAD Other    Sick sinus syndrome Brother        has a PPM    Review of Systems:  As stated in the HPI and otherwise negative.   There were no vitals taken for this visit.  Physical Examination: General: Well developed, well nourished, NAD  HEENT: OP clear, mucus membranes moist  SKIN: warm, dry. No rashes. Neuro: No focal deficits  Musculoskeletal: Muscle strength 5/5 all ext  Psychiatric: Mood and affect normal  Neck: No JVD, no carotid  bruits, no thyromegaly, no lymphadenopathy.  Lungs:Clear bilaterally, no wheezes, rhonci, crackles Cardiovascular: Regular rate and rhythm. No murmurs, gallops or rubs. Abdomen:Soft. Bowel sounds present. Non-tender.  Extremities: No lower extremity edema. Pulses are 2 + in the bilateral DP/PT.  EKG:  EKG {ACTION; IS/IS [id number]} ordered today. The ekg ordered today demonstrates ***  Recent Labs: 05/23/2022: B Natriuretic Peptide 67.7; BUN 30; Creatinine, Ser 1.62; Potassium 3.5; Sodium 141   Lipid Panel No results found for: "CHOL", "TRIG", "HDL", "CHOLHDL", "VLDL", "LDLCALC", "LDLDIRECT"   Wt Readings from Last 3 Encounters:  05/23/22 211 lb 9.6 oz (96 kg)  01/17/22 217 lb 6.4 oz (98.6 kg)  11/17/21 212 lb 3.2 oz (96.3 kg)      Assessment and Plan:   1.   Labs/ tests ordered today include:  No orders of the defined types were placed in this encounter.    Disposition:   F/U with me in ***    Signed, Lauree Chandler, MD, [facility] 07/27/2022 12:04 PM    [facility] [place], [place],   [address] Phone: [phone]; Fax: [phone]

## 2022-07-27 NOTE — Telephone Encounter (Signed)
Called and spoke with the patient's daughter, [name] [facility]).   In reviewing the patient's chart for her appointment with Dr. Angelena Form today, it was determined her appointment should be with Dr. Radford Pax for discussion of her OSA and Inspire as she does not wish to use CPAP.  The referral is from [facility] where she already sees Dr. Haroldine Laws for chronic diastolic heart failure.   Message sent to scheduling to arrange an appointment with Dr. Radford Pax.

## 2022-07-30 ENCOUNTER — Ambulatory Visit (INDEPENDENT_AMBULATORY_CARE_PROVIDER_SITE_OTHER): Payer: Medicare Other

## 2022-07-30 DIAGNOSIS — Z950 Presence of cardiac pacemaker: Secondary | ICD-10-CM | POA: Diagnosis not present

## 2022-07-30 DIAGNOSIS — I495 Sick sinus syndrome: Secondary | ICD-10-CM | POA: Diagnosis not present

## 2022-07-31 LAB — CUP PACEART REMOTE DEVICE CHECK
Battery Remaining Longevity: 44 mo
Battery Voltage: 2.97 V
Brady Statistic AP VP Percent: 0.13 %
Brady Statistic AP VS Percent: 99.67 %
Brady Statistic AS VP Percent: 0 %
Brady Statistic AS VS Percent: 0.2 %
Brady Statistic RA Percent Paced: 99.62 %
Brady Statistic RV Percent Paced: 0.15 %
Date Time Interrogation Session: 20231120192550
Implantable Lead Connection Status: 753985
Implantable Lead Connection Status: 753985
Implantable Lead Implant Date: 20161110
Implantable Lead Implant Date: 20161110
Implantable Lead Location: 753859
Implantable Lead Location: 753860
Implantable Lead Model: 5076
Implantable Lead Model: 5076
Implantable Pulse Generator Implant Date: 20161110
Lead Channel Impedance Value: 361 Ohm
Lead Channel Impedance Value: 380 Ohm
Lead Channel Impedance Value: 399 Ohm
Lead Channel Impedance Value: 437 Ohm
Lead Channel Pacing Threshold Amplitude: 0.625 V
Lead Channel Pacing Threshold Amplitude: 2 V
Lead Channel Pacing Threshold Pulse Width: 0.4 ms
Lead Channel Pacing Threshold Pulse Width: 0.4 ms
Lead Channel Sensing Intrinsic Amplitude: 3.125 mV
Lead Channel Sensing Intrinsic Amplitude: 3.125 mV
Lead Channel Sensing Intrinsic Amplitude: 4.25 mV
Lead Channel Sensing Intrinsic Amplitude: 4.25 mV
Lead Channel Setting Pacing Amplitude: 1.5 V
Lead Channel Setting Pacing Amplitude: 4 V
Lead Channel Setting Pacing Pulse Width: 0.4 ms
Lead Channel Setting Sensing Sensitivity: 2.8 mV
Zone Setting Status: 755011
Zone Setting Status: 755011

## 2022-08-01 DIAGNOSIS — K5900 Constipation, unspecified: Secondary | ICD-10-CM | POA: Diagnosis not present

## 2022-08-01 DIAGNOSIS — Z23 Encounter for immunization: Secondary | ICD-10-CM | POA: Diagnosis not present

## 2022-08-01 DIAGNOSIS — E118 Type 2 diabetes mellitus with unspecified complications: Secondary | ICD-10-CM | POA: Diagnosis not present

## 2022-08-24 DIAGNOSIS — N184 Chronic kidney disease, stage 4 (severe): Secondary | ICD-10-CM | POA: Diagnosis not present

## 2022-09-12 NOTE — Progress Notes (Signed)
Remote pacemaker transmission.   

## 2022-09-25 DIAGNOSIS — M5459 Other low back pain: Secondary | ICD-10-CM | POA: Diagnosis not present

## 2022-09-25 DIAGNOSIS — M5416 Radiculopathy, lumbar region: Secondary | ICD-10-CM | POA: Diagnosis not present

## 2022-09-25 DIAGNOSIS — M961 Postlaminectomy syndrome, not elsewhere classified: Secondary | ICD-10-CM | POA: Diagnosis not present

## 2022-09-26 DIAGNOSIS — I129 Hypertensive chronic kidney disease with stage 1 through stage 4 chronic kidney disease, or unspecified chronic kidney disease: Secondary | ICD-10-CM | POA: Diagnosis not present

## 2022-09-26 DIAGNOSIS — E1122 Type 2 diabetes mellitus with diabetic chronic kidney disease: Secondary | ICD-10-CM | POA: Diagnosis not present

## 2022-09-26 DIAGNOSIS — E876 Hypokalemia: Secondary | ICD-10-CM | POA: Diagnosis not present

## 2022-09-26 DIAGNOSIS — I5030 Unspecified diastolic (congestive) heart failure: Secondary | ICD-10-CM | POA: Diagnosis not present

## 2022-09-26 DIAGNOSIS — N184 Chronic kidney disease, stage 4 (severe): Secondary | ICD-10-CM | POA: Diagnosis not present

## 2022-09-27 IMAGING — MR MR LUMBAR SPINE W/O CM
5 of 6 series · 30 of 48 positions shown · non-contrast
Comparison: MRI of the lumbar spine November 28, 2015.

CLINICAL DATA: Low back pain.

EXAM:
MRI LUMBAR SPINE WITHOUT CONTRAST
TECHNIQUE: Multiplanar, multisequence MR imaging of the lumbar spine was
performed. No intravenous contrast was administered.

[Series 5: T2 · sagittal · 4.0mm · 0.73mm/px · 7 of 20 slices shown (1 of 3)]
[im 1/20]
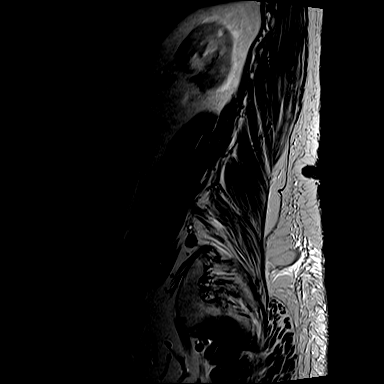
[im 4/20]
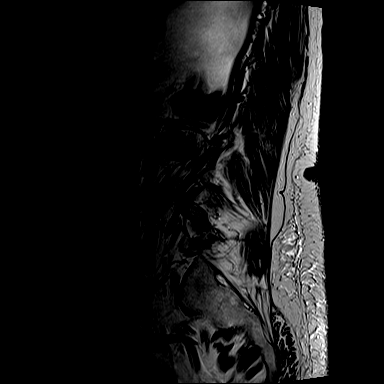
[im 7/20]
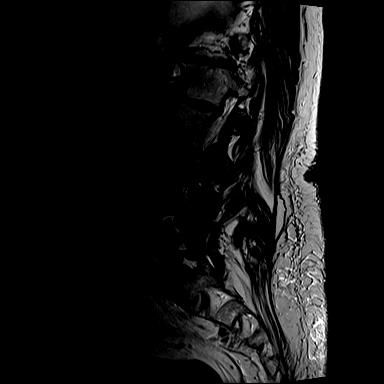
[im 10/20]
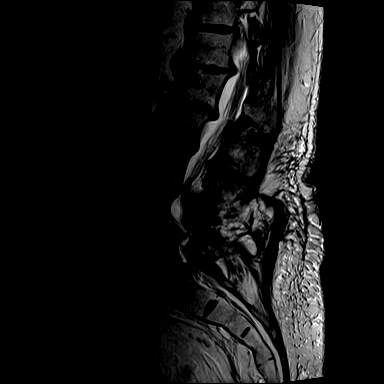
[im 13/20]
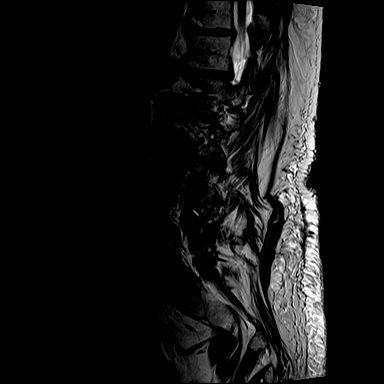
[im 16/20]
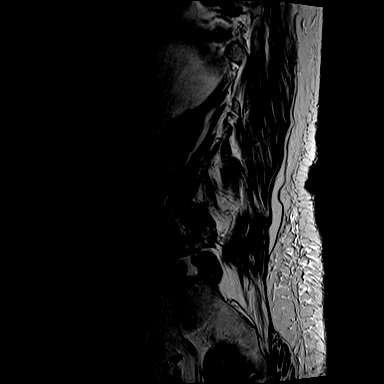
[im 20/20]
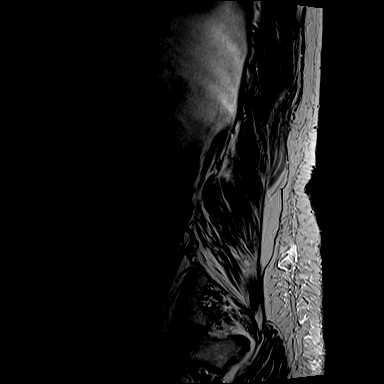

[Series 7: T1 · sagittal · 4.0mm · 0.88mm/px · 6 of 20 slices shown (1 of 2)]
[im 1/20]
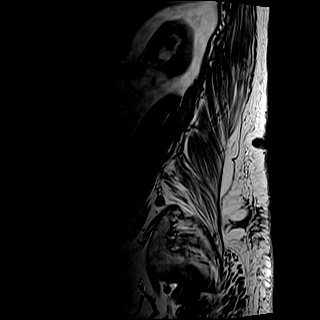
[im 4/20]
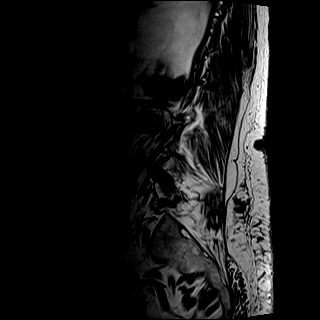
[im 8/20]
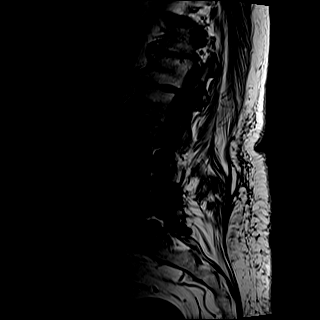
[im 12/20]
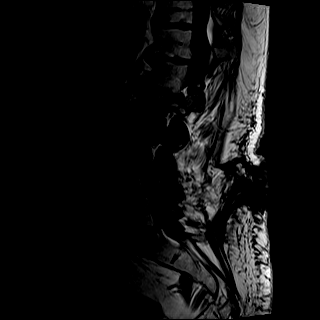
[im 16/20]
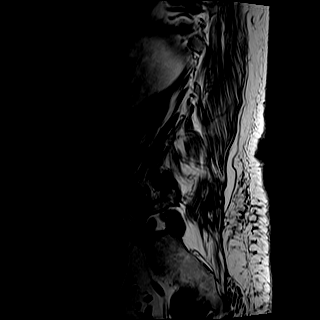
[im 20/20]
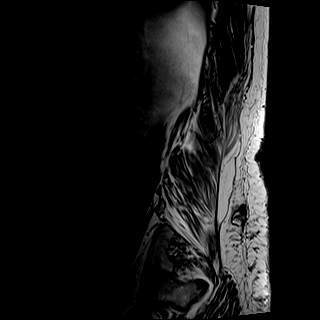

[Series 8: T2 · coronal · 4.0mm · 0.73mm/px · 8 of 24 slices shown (2 of 3)]
[im 1/24]
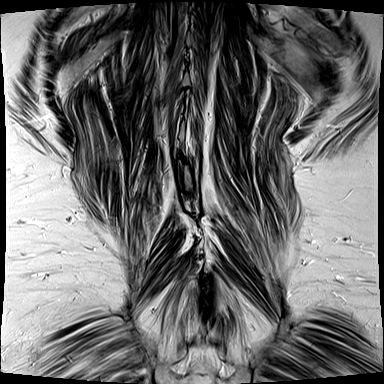
[im 4/24]
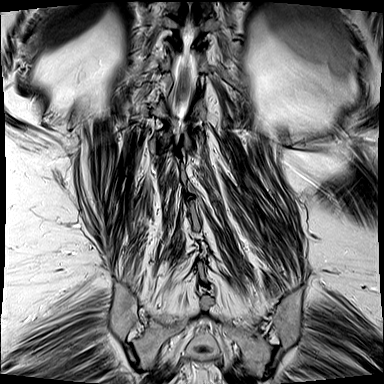
[im 7/24]
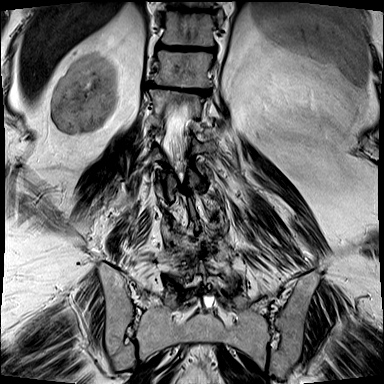
[im 10/24]
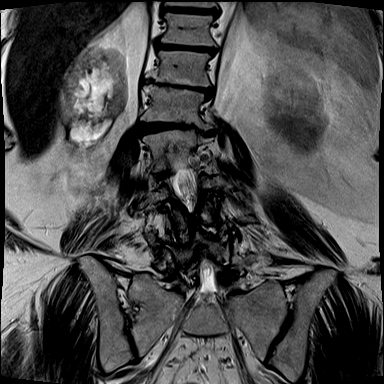
[im 14/24]
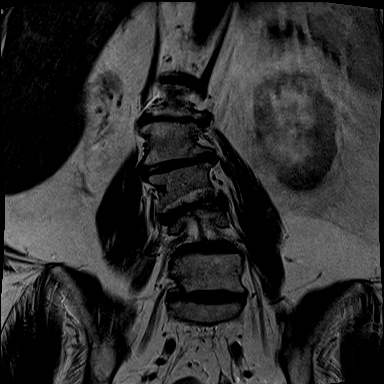
[im 17/24]
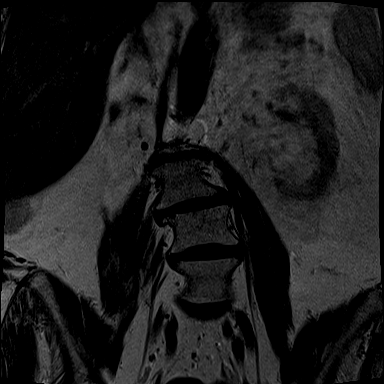
[im 20/24]
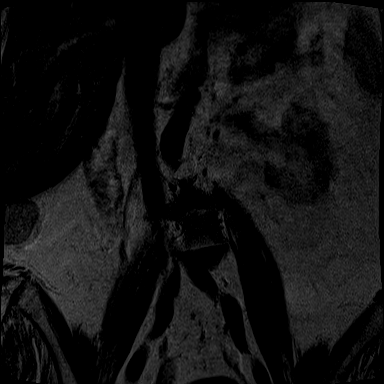
[im 24/24]
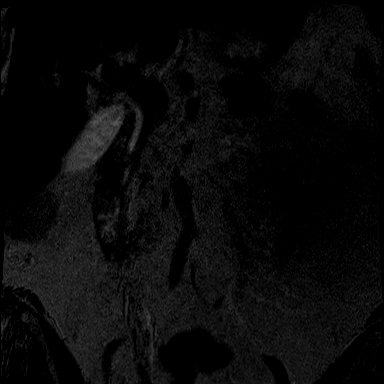

[Series 9: T2 · axial · 4.0mm · 0.57mm/px · z∈[-92,+95]mm · 8 of 32 slices shown (3 of 3)]
[im 1/32]
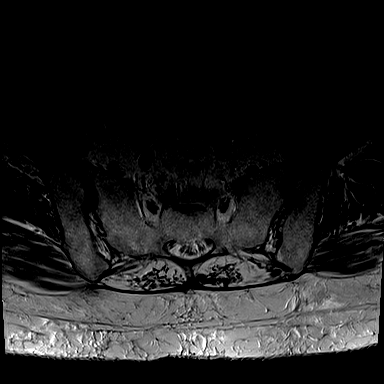
[im 4/32]
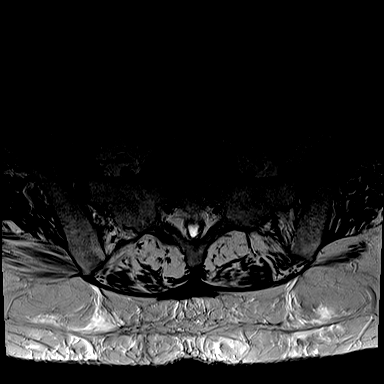
[im 11/32]
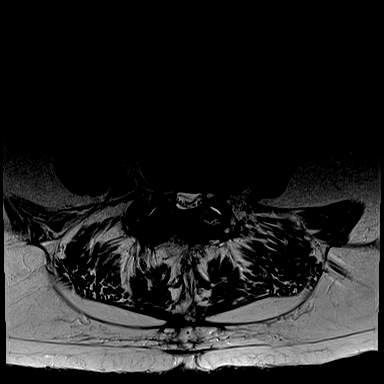
[im 14/32]
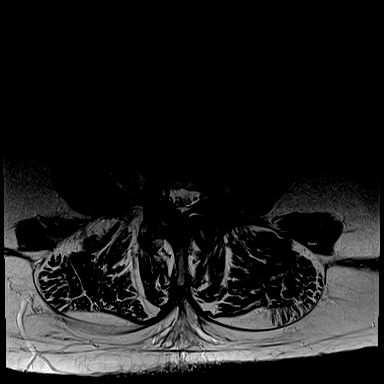
[im 18/32]
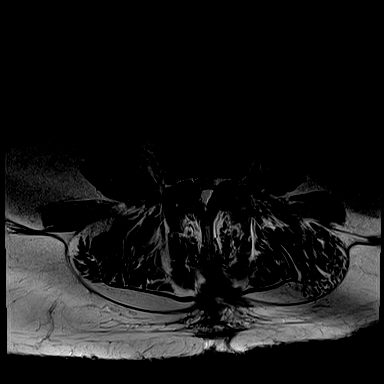
[im 21/32]
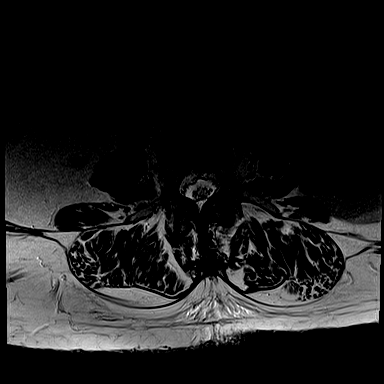
[im 28/32]
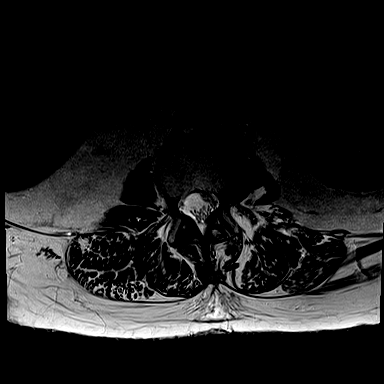
[im 32/32]
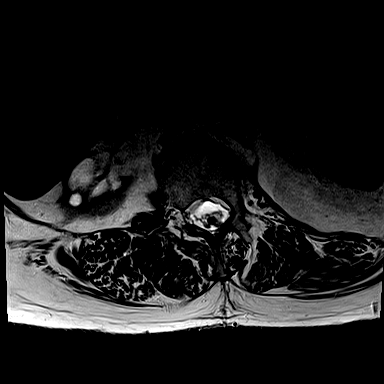

[Series 10: T1 · axial · 4.0mm · 0.34mm/px · 1 of 32 slices shown (2 of 2)]
[im 1/32]
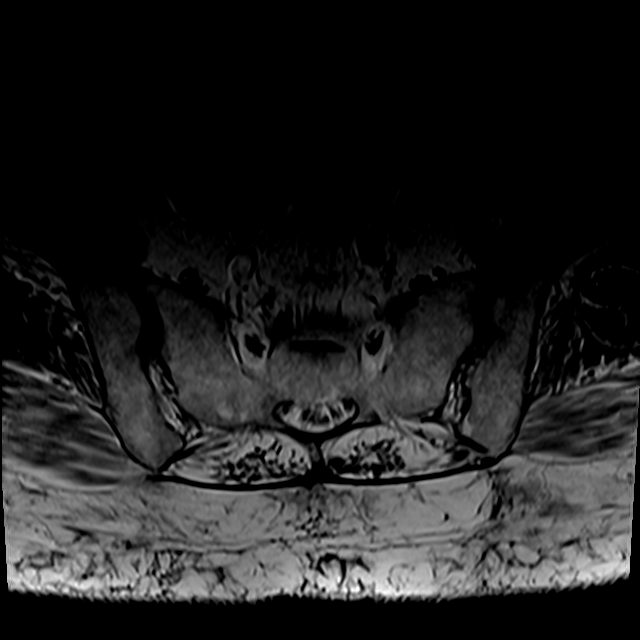

[30 of 48 positions shown; findings below may reference images not displayed]

FINDINGS: Segmentation:  Standard.

Alignment: Dextroconvex scoliosis of the lumbar spine. Grade 1
anterolisthesis of L4 over L5. Small retrolisthesis at L1-2, L2-3
and L3-4.

Vertebrae: No fracture, evidence of discitis, or bone lesion. Stable
appearance of sclerotic focus in the L3 vertebral body, likely a
bone island. Endplate degenerative changes at L1-2, L2-3 and L3-4.
Postsurgical changes from L4-5 decompression.

Conus medullaris and cauda equina: Conus extends to the L1 level.
Conus and cauda equina appear normal.

Paraspinal and other soft tissues: Irregularly-shaped cystic
structure in the inferior right renal pole, incompletely evaluated.

Disc levels:

T12-L1: Imaged only on the sagittal plane. Loss of disc height and
disc bulge causing indentation on the thecal sac and on the anterior
aspect of the conus medullaris without significant spinal canal or
neural foraminal stenosis.

L1-2: Loss of disc height, disc bulge with superimposed left
foraminal/far lateral disc protrude and moderate facet degenerative
changes resulting in narrowing of the left subarticular zone and
moderate left neural foraminal narrowing. No significant change from
prior.

L2-3: Loss of disc height, right asymmetric disc bulge and moderate
facet degenerative changes resulting in narrowing of the right
subarticular zone mild bilateral neural foraminal narrowing.

L3-4: Mild loss of disc height, disc bulge, prominent hypertrophic
facet degenerative changes resulting in severe spinal canal stenosis
and moderate bilateral neural foraminal narrowing. Findings have
significantly progressed since prior MRI.

L4-5: Status post decompression with interval resolution of the
spinal canal stenosis. There is residual disc bulge/disc uncovering
and prominent facet degenerative changes/bone graft. Moderate
bilateral neural foraminal narrowing.

L5-S1: Mild loss of disc height, shallow disc bulge mildly
asymmetric to the right, advanced facet degenerative changes, more
pronounced on the right with joint effusion. Findings result in
moderate right neural foraminal narrowing. No spinal canal stenosis.
Findings have progressed since prior MRI.
IMPRESSION: 1. Postsurgical changes from L4-5 decompression with interval
resolution of the L4-5 spinal canal stenosis. Moderate bilateral
neural foraminal narrowing at this level.
2. Interval progression of degenerative changes at L3-4 with severe
spinal canal stenosis and moderate bilateral neural foraminal
narrowing.
3. Moderate right neural foraminal narrowing at L5-S1, progressed
since prior MRI.
4. Irregularly-shaped cystic structure in the inferior right renal
pole, incompletely evaluated. Recommend renal ultrasound for further
evaluation.

## 2022-10-03 ENCOUNTER — Ambulatory Visit: Payer: Medicare Other | Admitting: Cardiology

## 2022-10-05 ENCOUNTER — Ambulatory Visit: Payer: Medicare Other | Attending: Cardiology | Admitting: Cardiology

## 2022-10-05 ENCOUNTER — Encounter: Payer: Self-pay | Admitting: Cardiology

## 2022-10-05 VITALS — BP 120/58 | HR 73 | Ht 63.0 in | Wt 208.2 lb

## 2022-10-05 DIAGNOSIS — G4733 Obstructive sleep apnea (adult) (pediatric): Secondary | ICD-10-CM | POA: Diagnosis not present

## 2022-10-05 DIAGNOSIS — I10 Essential (primary) hypertension: Secondary | ICD-10-CM | POA: Diagnosis not present

## 2022-10-05 NOTE — Progress Notes (Signed)
Sleep Medicine Consult NOte  Date:  10/05/2022   ID:  [name], DOB [birth year], MRN [id number]  PCP:  Caren Macadam, MD  Cardiologist:  Glori Bickers, MD Sleep Medicine:  Fransico Him, MD Electrophysiologist:  Virl Axe, MD   Chief Complaint:  New Patient>>OSA  History of Present Illness:    [name] is a 82 y.o. female  with a hx of CKD stage 3, DM, DHL, GERD and PPM who was referred for sleep study for snoring and obesity. She underwent sleep study showing  Mild OSA with an AHI of 11.2/hr with no central events and O2 sats as low as 85% with moderate snoring and nocturnal hypoxemia.  CPAP titration was recommended.  She opted not to proceed with CPAP titration.    Dr. Haroldine Laws saw her back in May 23 and she was still adamant about not using CPAP.  She was interested in the [place] device.  He repeated her sleep study which showed severe OSA with an AHI of 31/hr and no central events.  She is now referred to Sleep Medicine by Dr. Haroldine Laws for evaluation of treatment options for OSA.  She tells me that she feels tired all day.  She sleeps poorly and only gets about 3 hours of sleep.  She has very poor sleep hygiene and sleeps with all the lights on and the TV on all night.  She has always been scared of the dark and now cannot sleep without the lights.   Prior CV studies:   The following studies were reviewed today:  Sleep study  Past Medical History:  Diagnosis Date   Anxiety    Arthritis    Cancer ([place])    colon   CHF (congestive heart failure) (HCC)    Chronic kidney disease    stage 3   Chronic pain syndrome    Diabetes (HCC)    Diabetic neuropathy (HCC)    Dyslipidemia    Early cataracts, bilateral    Fibromyalgia    GERD (gastroesophageal reflux disease)    H/O syncope    Heart murmur    Hypertension    Lumbar spinal stenosis    and scoliosis   Pneumonia    PONV (postoperative nausea and vomiting)    Presence of permanent cardiac pacemaker     Restless legs    Spinal headache    Thyroid nodule    Past Surgical History:  Procedure Laterality Date   APPENDECTOMY     BREAST SURGERY     lumpectomy   COLON SURGERY     DILATION AND CURETTAGE OF UTERUS     EP IMPLANTABLE DEVICE N/A 07/21/2015   Procedure: Pacemaker Implant;  Surgeon: Deboraha Sprang, MD;  Location: [facility];  Service: Cardiovascular;  Laterality: N/A;   LUMBAR LAMINECTOMY/DECOMPRESSION MICRODISCECTOMY N/A 02/02/2016   Procedure: DECOMPRESSION L4-L5 WITH INSITE 2 FUSION ;  Surgeon: Melina Schools, MD;  Location: [facility];  Service: Orthopedics;  Laterality: N/A;   PARTIAL NEPHRECTOMY     RIGHT HEART CATH N/A 11/02/2020   Procedure: RIGHT HEART CATH;  Surgeon: Jolaine Artist, MD;  Location: [facility];  Service: Cardiovascular;  Laterality: N/A;   RIGHT/LEFT HEART CATH AND CORONARY ANGIOGRAPHY N/A 05/19/2018   Procedure: RIGHT/LEFT HEART CATH AND CORONARY ANGIOGRAPHY;  Surgeon: Jolaine Artist, MD;  Location: [facility];  Service: Cardiovascular;  Laterality: N/A;   TIBIA IM NAIL INSERTION Right 05/19/2020   Procedure: INTRAMEDULLARY (IM) NAIL TIBIAL;  Surgeon:  Nicholes Stairs, MD;  Location: [facility];  Service: Orthopedics;  Laterality: Right;   TONSILLECTOMY     TUBAL LIGATION       Current Meds  Medication Sig   acetaminophen (TYLENOL) 650 MG CR tablet 2 tablets   B-D UF III MINI PEN NEEDLES 31G X 5 MM MISC    Blood Glucose Monitoring Suppl (ACCU-CHEK AVIVA PLUS) w/Device KIT    calcium carbonate (OS-CAL) 600 MG TABS tablet Patient takes 1 tablet by mouth twice a week.   Cholecalciferol (VITAMIN D) 2000 UNITS tablet Take 2,000 Units by mouth daily.   Continuous Blood Gluc Sensor (FREESTYLE LIBRE 2 SENSOR) MISC See admin instructions.   cyclobenzaprine (FLEXERIL) 10 MG tablet Take 10 mg by mouth 3 (three) times daily as needed for muscle spasms.   diclofenac Sodium (VOLTAREN) 1 % GEL Apply 1 application topically at bedtime as  needed (sleep).   diphenhydrAMINE (BENADRYL) 25 MG tablet Take 25 mg by mouth daily as needed for itching or allergies.   DULoxetine (CYMBALTA) 20 MG capsule Take 20 mg by mouth daily.   gabapentin (NEURONTIN) 300 MG capsule Take 300 mg by mouth at bedtime.   hydrALAZINE (APRESOLINE) 100 MG tablet Take 1 tablet (100 mg total) by mouth 3 (three) times daily.   insulin degludec (TRESIBA FLEXTOUCH) 100 UNIT/ML FlexTouch Pen 50 Units as directed.   labetalol (NORMODYNE) 200 MG tablet TAKE ONE TABLET BY MOUTH TWICE A DAY   metolazone (ZAROXOLYN) 2.5 MG tablet Take 1 tablet (2.5 mg total) by mouth once a week. Every Friday   OZEMPIC, 0.25 OR 0.5 MG/DOSE, 2 MG/3ML SOPN Inject into the skin.   potassium chloride SA (KLOR-CON M) 20 MEQ tablet Take 1 tablet (20 mEq total) by mouth daily. Take 2 extra tab on Friday with metolazone   pramipexole (MIRAPEX) 0.125 MG tablet Take 0.25 mg by mouth 2 (two) times daily.   rosuvastatin (CRESTOR) 10 MG tablet Take 10 mg by mouth daily.    torsemide (DEMADEX) 20 MG tablet Take 2 tablets (40 mg total) by mouth daily.   traMADol (ULTRAM) 50 MG tablet Take 2 tablets by mouth daily as needed. 4 times a day if   triamcinolone ointment (KENALOG) 0.1 % SMARTSIG:2 Topical Twice Daily   [DISCONTINUED] ACCU-CHEK AVIVA PLUS test strip    [DISCONTINUED] Accu-Chek Softclix Lancets lancets    [DISCONTINUED] Cromolyn Sodium (NASAL ALLERGY NA) Place 1 spray into the nose at bedtime as needed (sleep).   [DISCONTINUED] empagliflozin (JARDIANCE) 10 MG TABS tablet Take 1 tablet (10 mg total) by mouth daily before breakfast.   [DISCONTINUED] insulin degludec (TRESIBA FLEXTOUCH) 200 UNIT/ML FlexTouch Pen Inject 75 Units into the skin daily.     Allergies:   Penicillins, Codeine, Adhesive [tape], Aspirin, Crab (diagnostic), Iodine, Ivp dye [iodinated contrast media], and Sulfa antibiotics   Social History   Tobacco Use   Smoking status: Former    Types: Cigarettes    Start date:  09/10/1953    Quit date: 09/11/1983    Years since quitting: 39.0   Smokeless tobacco: Never  Vaping Use   Vaping Use: Never used  Substance Use Topics   Alcohol use: Yes    Alcohol/week: 0.0 standard drinks of alcohol    Comment: rare glass of wine   Drug use: No     Family Hx: The patient's family history includes CAD in an other family member; Sick sinus syndrome in her brother.  ROS:   Please see the history of present  illness.     All other systems reviewed and are negative.   Labs/Other Tests and Data Reviewed:    Recent Labs: 05/23/2022: B Natriuretic Peptide 67.7; BUN 30; Creatinine, Ser 1.62; Potassium 3.5; Sodium 141   Recent Lipid Panel No results found for: "CHOL", "TRIG", "HDL", "CHOLHDL", "LDLCALC", "LDLDIRECT"  Wt Readings from Last 3 Encounters:  10/05/22 208 lb 3.2 oz (94.4 kg)  05/23/22 211 lb 9.6 oz (96 kg)  01/17/22 217 lb 6.4 oz (98.6 kg)     Objective:    Vital Signs:  BP (!) 120/58   Pulse 73   Ht '5\' 3"'$  (1.6 m)   Wt 208 lb 3.2 oz (94.4 kg)   SpO2 93%   BMI 36.88 kg/m    GEN: Well nourished, well developed in no acute distress HEENT: Normal NECK: No JVD; No carotid bruits LYMPHATICS: No lymphadenopathy CARDIAC:RRR, no murmurs, rubs, gallops RESPIRATORY:  Clear to auscultation without rales, wheezing or rhonchi  ABDOMEN: Soft, non-tender, non-distended MUSCULOSKELETAL:  No edema; No deformity  SKIN: Warm and dry NEUROLOGIC:  Alert and oriented x 3 PSYCHIATRIC:  Normal affect  ASSESSMENT & PLAN:    1.  OSA  -She has a hx of mild OSA with an AHI of 11/hr and nocturnal hypoxemia in the past.  Due to severe claustrophobia she refused CPAP.   -repeat sleep study after stating that she would be interested in [place] if she qualified, showed severe OSA with AHI 31/hr.  -I explained to her today that typically insurance will not pay for [facility] unless a patient has tried CPAP for at least 6 months -also, her BMI is > 32, although they are  starting to place devices in patients with BMI > 32 -I will refer to Dr. Redmond Baseman to see if he would agree to implant and see if insurance would cover without a trial of CPAP -she has very poor sleep hygiene and sleeps with her lights and TV on all night and only sleeps 3 hours>>she needs to try to improve this -Remember to establish a good bedtime routine, and work on sleep hygiene.  -Limit daytime naps , avoid stimulants such as caffeine and nicotine close to bedtime, exercise daily to promote sleep quality, avoid heavy , spicy, fried , or rich foods before bed.  -Ensure adequate exposure to natural light during the day,establish a relaxing bedtime routine with a pleasant sleep environment ( Bedroom between 60 and 67 degrees, turn off bright lights , TV or device screens screens , consider black out curtains or white noise machines) -Do not drive if sleepy.  2.  HTN -BP is controlled -continue prescription drug management with Labetolol '200mg'$  BID, Hydralazine '100mg'$  TID with PRN refills  Medication Adjustments/Labs and Tests Ordered: Current medicines are reviewed at length with the patient today.  Concerns regarding medicines are outlined above.  Tests Ordered: No orders of the defined types were placed in this encounter.  Medication Changes: No orders of the defined types were placed in this encounter.   Disposition:  Follow up PRN  Signed, Fransico Him, MD  10/05/2022 2:01 PM    [place]

## 2022-10-05 NOTE — Patient Instructions (Addendum)
Medication Instructions:  Your physician recommends that you continue on your current medications as directed. Please refer to the Current Medication list given to you today.  *If you need a refill on your cardiac medications before your next appointment, please call your pharmacy*   Lab Work: None.  If you have labs (blood work) drawn today and your tests are completely normal, you will receive your results only by: [place] (if you have MyChart) OR A paper copy in the mail If you have any lab test that is abnormal or we need to change your treatment, we will call you to review the results.   Testing/Procedures: None.   Follow-Up: At [facility], you and your health needs are our priority.  As part of our continuing mission to provide you with exceptional heart care, we have created designated Provider Care Teams.  These Care Teams include your primary Cardiologist (physician) and Advanced Practice Providers (APPs -  Physician Assistants and Nurse Practitioners) who all work together to provide you with the care you need, when you need it.  We recommend signing up for the patient portal called "MyChart".  Sign up information is provided on this After Visit Summary.  MyChart is used to connect with patients for Virtual Visits (Telemedicine).  Patients are able to view lab/test results, encounter notes, upcoming appointments, etc.  Non-urgent messages can be sent to your provider as well.   To learn more about what you can do with MyChart, go to [email or web address].    Your next appointment will be as needed with:   Provider:   Fransico Him, MD     Other Instructions You have been referred to Dr. Melida Quitter, ENT for [facility] device Assessment.  [address], [place], [place] [address] Phone: [phone]

## 2022-10-05 NOTE — Addendum Note (Signed)
Addended by: Joni Reining on: 10/05/2022 02:12 PM   Modules accepted: Orders

## 2022-10-23 ENCOUNTER — Other Ambulatory Visit: Payer: Self-pay

## 2022-10-23 DIAGNOSIS — G4733 Obstructive sleep apnea (adult) (pediatric): Secondary | ICD-10-CM

## 2022-10-25 ENCOUNTER — Other Ambulatory Visit (HOSPITAL_COMMUNITY): Payer: Self-pay | Admitting: Family Medicine

## 2022-10-29 ENCOUNTER — Ambulatory Visit (INDEPENDENT_AMBULATORY_CARE_PROVIDER_SITE_OTHER): Payer: Medicare Other

## 2022-10-29 DIAGNOSIS — I495 Sick sinus syndrome: Secondary | ICD-10-CM

## 2022-10-29 LAB — CUP PACEART REMOTE DEVICE CHECK
Battery Remaining Longevity: 40 mo
Battery Voltage: 2.97 V
Brady Statistic AP VP Percent: 0.09 %
Brady Statistic AP VS Percent: 99.76 %
Brady Statistic AS VP Percent: 0 %
Brady Statistic AS VS Percent: 0.16 %
Brady Statistic RA Percent Paced: 99.72 %
Brady Statistic RV Percent Paced: 0.1 %
Date Time Interrogation Session: 20240219084222
Implantable Lead Connection Status: 753985
Implantable Lead Connection Status: 753985
Implantable Lead Implant Date: 20161110
Implantable Lead Implant Date: 20161110
Implantable Lead Location: 753859
Implantable Lead Location: 753860
Implantable Lead Model: 5076
Implantable Lead Model: 5076
Implantable Pulse Generator Implant Date: 20161110
Lead Channel Impedance Value: 342 Ohm
Lead Channel Impedance Value: 361 Ohm
Lead Channel Impedance Value: 380 Ohm
Lead Channel Impedance Value: 418 Ohm
Lead Channel Pacing Threshold Amplitude: 0.625 V
Lead Channel Pacing Threshold Amplitude: 1.75 V
Lead Channel Pacing Threshold Pulse Width: 0.4 ms
Lead Channel Pacing Threshold Pulse Width: 0.4 ms
Lead Channel Sensing Intrinsic Amplitude: 3.75 mV
Lead Channel Sensing Intrinsic Amplitude: 3.75 mV
Lead Channel Sensing Intrinsic Amplitude: 4.25 mV
Lead Channel Sensing Intrinsic Amplitude: 4.25 mV
Lead Channel Setting Pacing Amplitude: 1.5 V
Lead Channel Setting Pacing Amplitude: 4.5 V
Lead Channel Setting Pacing Pulse Width: 0.4 ms
Lead Channel Setting Sensing Sensitivity: 2.8 mV
Zone Setting Status: 755011
Zone Setting Status: 755011

## 2022-11-23 DIAGNOSIS — L97911 Non-pressure chronic ulcer of unspecified part of right lower leg limited to breakdown of skin: Secondary | ICD-10-CM | POA: Diagnosis not present

## 2022-11-23 DIAGNOSIS — R600 Localized edema: Secondary | ICD-10-CM | POA: Diagnosis not present

## 2022-11-23 DIAGNOSIS — E118 Type 2 diabetes mellitus with unspecified complications: Secondary | ICD-10-CM | POA: Diagnosis not present

## 2022-11-23 DIAGNOSIS — E11621 Type 2 diabetes mellitus with foot ulcer: Secondary | ICD-10-CM | POA: Diagnosis not present

## 2022-11-23 DIAGNOSIS — N184 Chronic kidney disease, stage 4 (severe): Secondary | ICD-10-CM | POA: Diagnosis not present

## 2022-11-23 DIAGNOSIS — E1122 Type 2 diabetes mellitus with diabetic chronic kidney disease: Secondary | ICD-10-CM | POA: Diagnosis not present

## 2022-12-12 NOTE — Progress Notes (Signed)
Remote pacemaker transmission.   

## 2022-12-25 ENCOUNTER — Encounter (HOSPITAL_BASED_OUTPATIENT_CLINIC_OR_DEPARTMENT_OTHER): Payer: Medicare Other | Attending: Internal Medicine | Admitting: Internal Medicine

## 2022-12-25 DIAGNOSIS — I890 Lymphedema, not elsewhere classified: Secondary | ICD-10-CM | POA: Diagnosis not present

## 2022-12-25 DIAGNOSIS — E1122 Type 2 diabetes mellitus with diabetic chronic kidney disease: Secondary | ICD-10-CM | POA: Insufficient documentation

## 2022-12-25 DIAGNOSIS — E1151 Type 2 diabetes mellitus with diabetic peripheral angiopathy without gangrene: Secondary | ICD-10-CM | POA: Diagnosis not present

## 2022-12-25 DIAGNOSIS — Z85038 Personal history of other malignant neoplasm of large intestine: Secondary | ICD-10-CM | POA: Insufficient documentation

## 2022-12-25 DIAGNOSIS — L97822 Non-pressure chronic ulcer of other part of left lower leg with fat layer exposed: Secondary | ICD-10-CM | POA: Diagnosis not present

## 2022-12-25 DIAGNOSIS — I130 Hypertensive heart and chronic kidney disease with heart failure and stage 1 through stage 4 chronic kidney disease, or unspecified chronic kidney disease: Secondary | ICD-10-CM | POA: Diagnosis not present

## 2022-12-25 DIAGNOSIS — I5032 Chronic diastolic (congestive) heart failure: Secondary | ICD-10-CM | POA: Diagnosis not present

## 2022-12-25 DIAGNOSIS — Z950 Presence of cardiac pacemaker: Secondary | ICD-10-CM | POA: Insufficient documentation

## 2022-12-25 DIAGNOSIS — E11622 Type 2 diabetes mellitus with other skin ulcer: Secondary | ICD-10-CM

## 2022-12-25 DIAGNOSIS — Z833 Family history of diabetes mellitus: Secondary | ICD-10-CM | POA: Insufficient documentation

## 2022-12-25 DIAGNOSIS — E1142 Type 2 diabetes mellitus with diabetic polyneuropathy: Secondary | ICD-10-CM | POA: Diagnosis not present

## 2022-12-25 DIAGNOSIS — L97812 Non-pressure chronic ulcer of other part of right lower leg with fat layer exposed: Secondary | ICD-10-CM | POA: Diagnosis not present

## 2022-12-25 DIAGNOSIS — Z8249 Family history of ischemic heart disease and other diseases of the circulatory system: Secondary | ICD-10-CM | POA: Insufficient documentation

## 2022-12-25 DIAGNOSIS — N184 Chronic kidney disease, stage 4 (severe): Secondary | ICD-10-CM | POA: Diagnosis not present

## 2022-12-25 DIAGNOSIS — I87311 Chronic venous hypertension (idiopathic) with ulcer of right lower extremity: Secondary | ICD-10-CM | POA: Diagnosis not present

## 2022-12-25 NOTE — Progress Notes (Signed)
CONNIE, MURRIETTA ([id number]) [id number] Page 1 of 3 Visit Report for 12/25/2022 Allergy List Details Patient Name: Date of Service: [name], [name] 12/25/2022 12:30 PM Medical Record Number: [id number] Patient Account Number: [id number] Date of Birth/Sex: Treating RN: December 01, 1940 (82 y.o. Female) Fonnie Mu Primary Care Lihanna Biever: Aliene Beams Other Clinician: Referring Markale Birdsell: Treating Delany Steury/Extender: Teodoro Kil Weeks in Treatment: 0 Allergies Active Allergies penicillin codeine adhesive aspirin iodine Iodinated Contrast Media Sulfa (Sulfonamide Antibiotics) Allergy Notes Electronic Signature(s) Unsigned Previous Signature: 12/25/2022 11:10:16 AM Version By: Fonnie Mu RN Entered By: Redmond Pulling on 12/25/2022 12:58:45 -------------------------------------------------------------------------------- Arrival Information Details Patient Name: Date of Service: [name], [name] 12/25/2022 12:30 PM Medical Record Number: [id number] Patient Account Number: [id number] Date of Birth/Sex: Treating RN: 10-Mar-1941 (82 y.o. Female) Redmond Pulling Primary Care Caprina Wussow: Aliene Beams Other Clinician: Referring Arber Wiemers: Treating Kynnedy Carreno/Extender: Teodoro Kil Weeks in Treatment: 0 Visit Information Patient Arrived: Cane Arrival Time: 12:56 Accompanied By: self Transfer Assistance: None Patient Identification Verified: Yes Secondary Verification Process Completed: Yes History Since Last Visit Added or deleted any medications: No Any new allergies or adverse reactions: No Had a fall or experienced change in activities of daily living that may affect risk of falls: No Signs or symptoms of abuse/neglect since last visito No Hospitalized since last visit: No Implantable device outside of the clinic excluding cellular tissue based products placed in the center since last visit: No Electronic  Signature(s) Unsigned Entered By: Redmond Pulling on 12/25/2022 12:58:02 [name] ([id number]) [id number] Page 2 of 3 -------------------------------------------------------------------------------- Multi Wound Chart Details Patient Name: Date of Service: [name], [name] 12/25/2022 12:30 PM Medical Record Number: [id number] Patient Account Number: [id number] Date of Birth/Sex: Treating RN: [birth year] (82 y.o. Female) Primary Care Rennie Rouch: Aliene Beams Other Clinician: Referring Elza Varricchio: Treating Burgandy Hackworth/Extender: Teodoro Kil Weeks in Treatment: 0 [Treatment Notes:Wound Assessments Treatment Notes] Electronic Signature(s) Unsigned Entered By: Geralyn Corwin on 12/25/2022 13:00:56 -------------------------------------------------------------------------------- Wound Assessment Details Patient Name: Date of Service: [name], [name] 12/25/2022 12:30 PM Medical Record Number: [id number] Patient Account Number: [id number] Date of Birth/Sex: Treating RN: [birth year] (82 y.o. Female) Shawn Stall Primary Care Annisha Baar: Aliene Beams Other Clinician: Referring Aalijah Lanphere: Treating Ommie Degeorge/Extender: Teodoro Kil Weeks in Treatment: 0 Wound Status Wound Number: 5 Primary Lymphedema Etiology: Wound Location: Right, Anterior Lower Leg Wound Open Wounding Event: Gradually Appeared Status: Date Acquired: 11/22/2022 Comorbid Lymphedema, Congestive Heart Failure, Hypertension, Peripheral Weeks Of Treatment: 0 History: Venous Disease, Type II Diabetes, Osteoarthritis, Neuropathy, Clustered Wound: No Confinement Anxiety Photos Wound Measurements Length: (cm) 0.8 Width: (cm) 0.5 Depth: (cm) 0.2 Area: (cm) 0.314 Volume: (cm) 0.063 % Reduction in Area: % Reduction in Volume: Epithelialization: Small (1-33%) Tunneling: No Undermining: No Wound Description Classification: Full Thickness Without Exposed  Suppor Wound Margin: Distinct, outline attached Exudate Amount: Medium Exudate Type: Serosanguineous Exudate Color: red, brown t Structures Foul Odor After Cleansing: No Slough/Fibrino Yes Wound Bed Granulation Amount: Large (67-100%) Exposed Structure Granulation Quality: Red, Pink Fascia Exposed: No Necrotic Amount: Small (1-33%) Fat Layer (Subcutaneous Tissue) Exposed: Yes Budzik, Jadesola ([id number]) [id number] Page 3 of 3 Necrotic Quality: Adherent Slough Tendon Exposed: No Muscle Exposed: No Joint Exposed: No Bone Exposed: No Periwound Skin Texture Texture Color No Abnormalities Noted: No No Abnormalities Noted: No Callus: No Atrophie Blanche: No Crepitus: No Cyanosis: No Excoriation: No Ecchymosis: No Induration: No Erythema: No Rash: No Hemosiderin Staining: Yes Scarring: No Mottled: No Pallor: No Moisture Rubor: No No Abnormalities Noted: No Dry / Scaly: No Maceration: No  Electronic Signature(s) Unsigned Entered By: Shawn Stall on 12/25/2022 13:02:53 Signature(s): Date(s):

## 2022-12-25 NOTE — Progress Notes (Signed)
NOELL, LORENSEN ([id number]) [id number] Page 1 of 4 Visit Report for 12/25/2022 Abuse Risk Screen Details Patient Name: Date of Service: [name], [name] 12/25/2022 12:30 PM Medical Record Number: [id number] Patient Account Number: [id number] Date of Birth/Sex: Treating RN: 1941/05/17 (82 y.o. [name] Primary Care Toshika Parrow: Aliene Beams Other Clinician: Referring Esme Freund: Treating Eduarda Scrivens/Extender: Teodoro Kil Weeks in Treatment: 0 Abuse Risk Screen Items Answer ABUSE RISK SCREEN: Has anyone close to you tried to hurt or harm you recentlyo No Do you feel uncomfortable with anyone in your familyo No Has anyone forced you do things that you didnt want to doo No Electronic Signature(s) Signed: 12/25/2022 3:49:55 PM By: Redmond Pulling RN, BSN Entered By: Redmond Pulling on 12/25/2022 13:03:24 -------------------------------------------------------------------------------- Activities of Daily Living Details Patient Name: Date of Service: [name], [name] 12/25/2022 12:30 PM Medical Record Number: [id number] Patient Account Number: [id number] Date of Birth/Sex: Treating RN: 05-28-1941 (82 y.o. [name] Primary Care Fynn Adel: Aliene Beams Other Clinician: Referring Donold Marotto: Treating Fernando Stoiber/Extender: Teodoro Kil Weeks in Treatment: 0 Activities of Daily Living Items Answer Activities of Daily Living (Please select one for each item) Drive Automobile Completely Able T Medications ake Completely Able Use T elephone Completely Able Care for Appearance Completely Able Use T oilet Completely Able Bath / Shower Completely Able Dress Self Completely Able Feed Self Completely Able Walk Completely Able Get In / Out Bed Completely Able Housework Completely Able Prepare Meals Completely Able Handle Money Completely Able Shop for Self Completely Able Electronic Signature(s) Signed: 12/25/2022  3:49:55 PM By: Redmond Pulling RN, BSN Entered By: Redmond Pulling on 12/25/2022 13:03:53 -------------------------------------------------------------------------------- Education Screening Details Patient Name: Date of Service: [name], [name] 12/25/2022 12:30 PM Medical Record Number: [id number] Patient Account Number: [id number] Date of Birth/Sex: Treating RN: 1940-09-13 (82 y.o. [name] Primary Care Deno Sida: Aliene Beams Other Clinician: Referring Admir Candelas: Treating Rashay Barnette/Extender: Teodoro Kil Weeks in TreatmentIzora Gala Clovis, Bosie Clos ([id number]) [id number] Page 2 of 4 Learning Preferences/Education Level/Primary Language Learning Preference: Explanation, Demonstration, Printed Material Highest Education Level: College or Above Preferred Language: Economist Language Barrier: No Translator Needed: No Memory Deficit: No Emotional Barrier: No Cultural/Religious Beliefs Affecting Medical Care: No Physical Barrier Impaired Vision: No Impaired Hearing: No Decreased Hand dexterity: No Knowledge/Comprehension Knowledge Level: High Comprehension Level: High Ability to understand written instructions: High Ability to understand verbal instructions: High Motivation Anxiety Level: Calm Cooperation: Cooperative Education Importance: Acknowledges Need Interest in Health Problems: Asks Questions Perception: Coherent Willingness to Engage in Self-Management High Activities: Readiness to Engage in Self-Management High Activities: Electronic Signature(s) Signed: 12/25/2022 3:49:55 PM By: Redmond Pulling RN, BSN Entered By: Redmond Pulling on 12/25/2022 13:04:32 -------------------------------------------------------------------------------- Fall Risk Assessment Details Patient Name: Date of Service: [name], [name] 12/25/2022 12:30 PM Medical Record Number: [id number] Patient Account Number: [id number] Date  of Birth/Sex: Treating RN: 1940/12/30 (82 y.o. [name] Primary Care Tahj Njoku: Aliene Beams Other Clinician: Referring Reagen Goates: Treating Zahir Eisenhour/Extender: Teodoro Kil Weeks in Treatment: 0 Fall Risk Assessment Items Have you had 2 or more falls in the last 12 monthso 0 Yes Have you had any fall that resulted in injury in the last 12 monthso 0 No FALLS RISK SCREEN History of falling - immediate or within 3 months 25 Yes Secondary diagnosis (Do you have 2 or more medical diagnoseso) 15 Yes Ambulatory aid None/bed rest/wheelchair/nurse 0 No Crutches/cane/walker 15 Yes Furniture 0 No Intravenous therapy Access/Saline/Heparin Lock 0 No Gait/Transferring Normal/ bed rest/  wheelchair 0 No Weak (short steps with or without shuffle, stooped but able to lift head while walking, may seek 10 Yes support from furniture) Impaired (short steps with shuffle, may have difficulty arising from chair, head down, impaired 0 No balance) Mental Status Oriented to own ability 0 Yes Overestimates or forgets limitations 0 No Risk Level: High Risk Score: 65 [name], [name] ([id number]) [id number] [id number] Page 3 of 4 Electronic Signature(s) -------------------------------------------------------------------------------- Foot Assessment Details Patient Name: Date of Service: [name], [name] 12/25/2022 12:30 PM Medical Record Number: [id number] Patient Account Number: [id number] Date of Birth/Sex: Treating RN: [birth year] (82 y.o. [name] Primary Care Capricia Serda: Aliene Beams Other Clinician: Referring Kaidence Callaway: Treating Alhassan Everingham/Extender: Teodoro Kil Weeks in Treatment: 0 Foot Assessment Items Site Locations + = Sensation present, - = Sensation absent, C = Callus, U = Ulcer R = Redness, W = Warmth, M = Maceration, PU = Pre-ulcerative lesion F = Fissure, S = Swelling, D = Dryness Assessment Right: Left: Other  Deformity: No No Prior Foot Ulcer: No No Prior Amputation: No No Charcot Joint: No No Ambulatory Status: Gait: Electronic Signature(s) Signed: 12/25/2022 3:49:55 PM By: Redmond Pulling RN, BSN Entered By: Redmond Pulling on 12/25/2022 13:08:00 -------------------------------------------------------------------------------- Nutrition Risk Screening Details Patient Name: Date of Service: [name], [name] 12/25/2022 12:30 PM Medical Record Number: [id number] Patient Account Number: [id number] Date of Birth/Sex: Treating RN: Apr 06, 1941 (82 y.o. [name] Primary Care Calab Sachse: Aliene Beams Other Clinician: Referring Hennie Gosa: Treating Riaz Onorato/Extender: Teodoro Kil Weeks in Treatment: 0 Height (in): 63 Weight (lbs): 200 Body Mass Index (BMI): 35.4 [name], [name] ([id number]) [id number] Page 4 of 4 Nutrition Risk Screening Items Score Screening NUTRITION RISK SCREEN: I have an illness or condition that made me change the kind and/or amount of food I eat 0 No I eat fewer than two meals per day 0 No I eat few fruits and vegetables, or milk products 0 No I have three or more drinks of beer, liquor or wine almost every day 0 No I have tooth or mouth problems that make it hard for me to eat 0 No I don't always have enough money to buy the food I need 0 No I eat alone most of the time 0 No I take three or more different prescribed or over-the-counter drugs a day 1 Yes Without wanting to, I have lost or gained 10 pounds in the last six months 0 No I am not always physically able to shop, cook and/or feed myself 0 No Nutrition Protocols Good Risk Protocol Moderate Risk Protocol High Risk Proctocol Risk Level: Good Risk Score: 1 Electronic Signature(s) Signed: 12/25/2022 3:49:55 PM By: Redmond Pulling RN, BSN Entered By: Redmond Pulling on 12/25/2022 13:06:13

## 2022-12-25 NOTE — Progress Notes (Addendum)
[name], [name] ([id number]) [id number] Page 1 of 10 Visit Report for 12/25/2022 Chief Complaint Document Details Patient Name: Date of Service: [name], [name] 12/25/2022 12:30 PM Medical Record Number: [id number] Patient Account Number: [id number] Date of Birth/Sex: Treating RN: [birth year] (82 y.o. F) Primary Care Provider: Aliene Beams Other Clinician: Referring Provider: Treating Provider/Extender: Teodoro Kil Weeks in Treatment: 0 Information Obtained from: Patient Chief Complaint 03/28/2020; patient is here for review of wounds on her bilateral lower legs 12/25/2022; bilateral lower extremity wound Electronic Signature(s) Signed: 12/25/2022 2:11:27 PM By: Geralyn Corwin DO Entered By: Geralyn Corwin on 12/25/2022 13:25:10 -------------------------------------------------------------------------------- HPI Details Patient Name: Date of Service: [name], [name] 12/25/2022 12:30 PM Medical Record Number: [id number] Patient Account Number: [id number] Date of Birth/Sex: Treating RN: 1941-08-26 (82 y.o. F) Primary Care Provider: Aliene Beams Other Clinician: Referring Provider: Treating Provider/Extender: Teodoro Kil Weeks in Treatment: 0 History of Present Illness HPI Description: ADMISSION 03/28/2020 This is a 82 year old woman who is accompanied by her daughter. She is moving to [place] from [place] where she lives. Infectious been going to the wound care center in [place] for the last 2 months. She apparently did not tolerate compression early on in that clinic stay. She is just using dry gauze over the wounds when she came in today. She tells me she has had both arterial and venous reflux studies although she does not have copies of these. She was apparently offered an ablation but I have no further information on that. She also is a type II diabetic. She was noted to have wounds on her  lower extremities during a CHF clinic visit with Dr. Gala Romney and she was referred here. She has several scattered areas on the right medial lower leg and a large area of superficial ulceration on the posterior medial left lower leg. past medical history; chronic kidney disease stage III, gastroesophageal reflux disease, type 2 diabetes with neuropathy, chronic diastolic heart failure, obstructive sleep apnea, hypertension, restless leg syndrome, pulmonary venous hypertension, history of colon CA, pacemaker and apparently venous reflux. We did not do arterial studies in the clinic today we are going to try to get the results that were already done within the last 2 months in [place] 7/26; patient readmitted to the clinic last week with predominantly chronic venous wounds almost circumferentially in the left lower leg and the right leg medially. She also has secondary lymphedema. We put her in compression. She went to the ER in [place] on Friday they remove the wrap on the left leg diagnosed her with cellulitis and put her on doxycycline. They apparently also did a ultrasound that was negative for DVT . We did not get any vascular information from [facility] where she apparently had arterial studies and venous studies. She is currently moving from [place] to [place] is up on her feet quite a bit. She was referred to vein and vascular here but never got to the appointment. ABIs were obtained here at 0.89 on the right and 1.02 on the left 8/9-Patient returns after establishing in clinic last visit, we have been doing 3 layer compression on both sides with calcium alginate, she is following up with the vein and vascular when she gets an appointment. The right side is healed the left side is still got some small open areas She has appt on Wed at Vein and vascular at which point she will come in for Nurse visit here 8/16; the patient arrives with all of her wounds closed.  She has bilateral  Juzo stockings. She saw Dr. Darrick Penna on 8/11 to review her reflux studies from the same day. On the right she had no evidence of DVT or SVT She did have deep venous reflux in the common femoral vein as well as superficial venous reflux in the . small saphenous vein of the mid calf on the left again no thrombus in the deep vein or superficial veins. Deep vein reflux in the common femoral vein as well as the great saphenous vein. She saw Dr. Darrick Penna in consult. Also of note he noted that she had a normal arterial scan done in [place] in April 2021, we were never able to obtain this. His feeling was that the patient did have a dilated greater saphenous vein however minimal to no evidence of reflux in her superficial venous system. She also was felt to have mild deep vein reflux and some degree of lymphedema. He recommended continued compression of the lower extremities with intermittent Unna boots if necessary. She was told to elevate her legs. If she develops recurrent ulcerations they were not opposed to revisiting the repeat venous reflux exam to see if she develops worsening reflux for consideration of laser ablation READMISSION 07/26/2020 [name], [name] ([id number]) [id number] Page 2 of 10 Patient is now an 82 year old woman who lives in [place]. We had her in the clinic in the summer with wounds on her bilateral lower legs secondary to chronic venous insufficiency with some degree of lymphedema. She was seen by Dr. Darrick Penna in August. This was predominantly to review her venous reflux. She quoted normal arterial studies done in [place] in April 2021 but I have never been able to see these myself.Marland Kitchen His feeling was the patient did have a bilateral great dilated greater saphenous vein however minimal to no evidence of reflux in her superficial venous system. She was also felt to have mild deep vein reflux and some degree of lymphedema. He recommended compression and leg  elevation.Marland Kitchen He was not opposed to repeating her venous reflux studies at some point if she develops worse to see if she develops worsening reflux for consideration of ablation. We discharged the patient with juxta lite stockings bilaterally and she has been compliant with wearing these. Unfortunately in September she had a fall suffering a right tibial fracture requiring an IM nail with a rod. She also had fibular fractures in at least one place. She was sent to skilled facility for rehabilitation she is now back at home. She had a DVT rule out study on 9/24 that was negative for DVT . She tells [place] that in the last 2 or 3 weeks she is developed blisters on her right lower extremity that ruptured into 2 open wounds. She has not been able to get these to close over. She religiously uses her juxta lite stockings on both legs and she gets them on is tight as she can. Originally the left leg was the larger of the 2 legs but since the patient had surgery the right leg is more swollen and tense than the left. She also tells me she had a second fall yesterday and has had pain in the left dorsal midfoot. She has diabetic neuropathy with Charcot foot left greater than right. Past medical history includes chronic kidney disease, hypertension, type 2 diabetes with peripheral neuropathy, congestive heart failure, hypertension, history of colon CA, lumbar stenosis ABIs in our clinic were noncompressible bilaterally 11/30; the wounds in the right lower extremity are healed anteriorly. She has  both Tubigrip and her juxta lite stockings she uses the Tubigrip as the primary layer. We went over this with her today she has the bilateral stockings. She will need to adjust her compression to roughly 30 mmHg 12/25/2022 [name] is a 82 year old female with a past medical history Of CKD stage IV, lymphedema/venous insufficiency, controlled type 2 diabetes that presents the clinic for a 7-month history of  bilateral lower extremity wounds. She has difficulty controlling the edema and is currently on metolazone 2.5 mg once weekly and torsemide 40 mg daily. She sleeps in a recliner. She has been using antibiotic ointment to the wound beds. She states she has Juxta lite compressions which she uses daily. She currently denies signs of infection. Electronic Signature(s) Signed: 12/25/2022 2:11:27 PM By: Geralyn Corwin DO Entered By: Geralyn Corwin on 12/25/2022 13:32:31 -------------------------------------------------------------------------------- Physical Exam Details Patient Name: Date of Service: [name], [name] 12/25/2022 12:30 PM Medical Record Number: [id number] Patient Account Number: [id number] Date of Birth/Sex: Treating RN: [birth year] (82 y.o. F) Primary Care Provider: Aliene Beams Other Clinician: Referring Provider: Treating Provider/Extender: Teodoro Kil Weeks in Treatment: 0 Constitutional respirations regular, non-labored and within target range for patient.. Cardiovascular 2+ dorsalis pedis/posterior tibialis pulses. Psychiatric pleasant and cooperative. Notes 1 open wound to the lower extremities bilaterally with granulation tissue and scant nonviable tissue. Significant hemosiderin staining noted throughout the legs. 3+ pitting edema to the knees bilaterally. No signs of surrounding infection including increased warmth, erythema or purulent drainage. Electronic Signature(s) Signed: 12/25/2022 2:11:27 PM By: Geralyn Corwin DO Entered By: Geralyn Corwin on 12/25/2022 13:33:17 -------------------------------------------------------------------------------- Physician Orders Details Patient Name: Date of Service: [name], [name] 12/25/2022 12:30 PM Medical Record Number: [id number] Patient Account Number: [id number] Date of Birth/Sex: Treating RN: Jul 21, 1941 (82 y.o. Orville Govern Primary Care Provider: Aliene Beams Other Clinician: Referring  Provider: Treating Provider/Extender: Teodoro Kil Weeks in Treatment: 0 Verbal / Phone Orders: No Diagnosis Coding [name], [name] ([id number]) [id number] Page 3 of 10 ICD-10 Coding Code Description [phone] Non-pressure chronic ulcer of other part of right lower leg with fat layer exposed L97.822 Non-pressure chronic ulcer of other part of left lower leg with fat layer exposed I87.311 Chronic venous hypertension (idiopathic) with ulcer of right lower extremity E11.622 Type 2 diabetes mellitus with other skin ulcer I89.0 Lymphedema, not elsewhere classified Follow-up Appointments ppointment in 1 week. - Dr Mikey Bussing - Room 7 01/01/2023 @ Return A Anesthetic Wound #5 Right,Anterior Lower Leg (In clinic) Topical Lidocaine 4% applied to wound bed Wound #6 Left,Plantar Foot (In clinic) Topical Lidocaine 4% applied to wound bed Bathing/ Shower/ Hygiene May shower with protection but do not get wound dressing(s) wet. Protect dressing(s) with water repellant cover (for example, large plastic bag) or a cast cover and may then take shower. Edema Control - Lymphedema / SCD / Other Bilateral Lower Extremities Elevate legs to the level of the heart or above for 30 minutes daily and/or when sitting for 3-4 times a day throughout the day. Avoid standing for long periods of time. Wound Treatment Wound #5 - Lower Leg Wound Laterality: Right, Anterior Cleanser: Soap and Water 1 x Per Week/30 Days Discharge Instructions: May shower and wash wound with dial antibacterial soap and water prior to dressing change. Cleanser: Vashe 5.8 (oz) 1 x Per Week/30 Days Discharge Instructions: Cleanse the wound with Vashe prior to applying a clean dressing using gauze sponges, not tissue or cotton balls. Peri-Wound Care: Sween Lotion (Moisturizing lotion) 1 x Per Week/30  Days Discharge Instructions: Apply moisturizing lotion as directed Prim Dressing: Hydrofera Blue  Ready Transfer Foam, 2.5x2.5 (in/in) 1 x Per Week/30 Days ary Discharge Instructions: Apply directly to wound bed as directed Prim Dressing: Santyl Ointment 1 x Per Week/30 Days ary Discharge Instructions: Apply nickel thick amount to wound bed as instructed Secondary Dressing: ABD Pad, 5x9 1 x Per Week/30 Days Discharge Instructions: Apply over primary dressing as directed. Secured With: Coban Self-Adherent Wrap 4x5 (in/yd) 1 x Per Week/30 Days Discharge Instructions: Secure with Coban as directed. Secured With: [employer], 4.5x3.1 (in/yd) 1 x Per Week/30 Days Discharge Instructions: Secure with Kerlix as directed. Secured With: 47M Medipore H Soft Cloth Surgical T ape, 4 x 10 (in/yd) 1 x Per Week/30 Days Discharge Instructions: Secure with tape as directed. Secured With: Unna 1 x Per Week/30 Days Discharge Instructions: to top of wrap to keep from sliding down Wound #6 - Foot Wound Laterality: Plantar, Left Cleanser: Soap and Water 1 x Per Week/30 Days Discharge Instructions: May shower and wash wound with dial antibacterial soap and water prior to dressing change. Cleanser: Vashe 5.8 (oz) 1 x Per Week/30 Days Discharge Instructions: Cleanse the wound with Vashe prior to applying a clean dressing using gauze sponges, not tissue or cotton balls. Peri-Wound Care: Sween Lotion (Moisturizing lotion) 1 x Per Week/30 Days Discharge Instructions: Apply moisturizing lotion as directed Prim Dressing: Hydrofera Blue Ready Transfer Foam, 2.5x2.5 (in/in) 1 x Per Week/30 Days ary Discharge Instructions: Apply directly to wound bed as directed Prim Dressing: Santyl Ointment 1 x Per Week/30 Days ary Discharge Instructions: Apply nickel thick amount to wound bed as instructed Bosserman, Bosie Clos ([id number]) [id number] Page 4 of 10 Secondary Dressing: ABD Pad, 5x9 1 x Per Week/30 Days Discharge Instructions: Apply over primary dressing as directed. Secured With: Coban  Self-Adherent Wrap 4x5 (in/yd) 1 x Per Week/30 Days Discharge Instructions: Secure with Coban as directed. Secured With: [employer], 4.5x3.1 (in/yd) 1 x Per Week/30 Days Discharge Instructions: Secure with Kerlix as directed. Secured With: 47M Medipore H Soft Cloth Surgical T ape, 4 x 10 (in/yd) 1 x Per Week/30 Days Discharge Instructions: Secure with tape as directed. Secured With: Unna 1 x Per Week/30 Days Discharge Instructions: to top of wrap to keep from sliding down Patient Medications llergies: penicillin, codeine, adhesive, aspirin, iodine, Iodinated Contrast Media, Sulfa (Sulfonamide Antibiotics) A Notifications Medication Indication Start End 12/25/2022 lidocaine DOSE topical 4 % cream - cream topical once daily Electronic Signature(s) Signed: 12/25/2022 2:11:27 PM By: Geralyn Corwin DO Entered By: Geralyn Corwin on 12/25/2022 13:33:33 -------------------------------------------------------------------------------- Problem List Details Patient Name: Date of Service: [name], [name] 12/25/2022 12:30 PM Medical Record Number: [id number] Patient Account Number: [id number] Date of Birth/Sex: Treating RN: 1941/01/03 (82 y.o. F) Primary Care Provider: Aliene Beams Other Clinician: Referring Provider: Treating Provider/Extender: Teodoro Kil Weeks in Treatment: 0 Active Problems ICD-10 Encounter Code Description Active Date MDM Diagnosis L97.812 Non-pressure chronic ulcer of other part of right lower leg with fat layer 12/25/2022 No Yes exposed L97.822 Non-pressure chronic ulcer of other part of left lower leg with fat layer exposed4/16/2024 No Yes I87.311 Chronic venous hypertension (idiopathic) with ulcer of right lower extremity 12/25/2022 No Yes E11.622 Type 2 diabetes mellitus with other skin ulcer 12/25/2022 No Yes I89.0 Lymphedema, not elsewhere classified 12/25/2022 No Yes Inactive Problems Resolved Problems Electronic Signature(s) TEAH, VOTAW ([id number]) [id number] Page 5 of 10 Signed: 12/25/2022 2:11:27 PM By: Geralyn Corwin DO Entered By: Geralyn Corwin on 12/25/2022  13:24:40 -------------------------------------------------------------------------------- Progress Note Details Patient Name: Date of Service: [name], [name] 12/25/2022 12:30 PM Medical Record Number: [id number] Patient Account Number: [id number] Date of Birth/Sex: Treating RN: March 11, 1941 (82 y.o. F) Primary Care Provider: Aliene Beams Other Clinician: Referring Provider: Treating Provider/Extender: Teodoro Kil Weeks in Treatment: 0 Subjective Chief Complaint Information obtained from Patient 03/28/2020; patient is here for review of wounds on her bilateral lower legs 12/25/2022; bilateral lower extremity wound History of Present Illness (HPI) ADMISSION 03/28/2020 This is a 82 year old woman who is accompanied by her daughter. She is moving to [place] from [place] where she lives. Infectious been going to the wound care center in [place] for the last 2 months. She apparently did not tolerate compression early on in that clinic stay. She is just using dry gauze over the wounds when she came in today. She tells me she has had both arterial and venous reflux studies although she does not have copies of these. She was apparently offered an ablation but I have no further information on that. She also is a type II diabetic. She was noted to have wounds on her lower extremities during a CHF clinic visit with Dr. Gala Romney and she was referred here. She has several scattered areas on the right medial lower leg and a large area of superficial ulceration on the posterior medial left lower leg. past medical history; chronic kidney disease stage III, gastroesophageal reflux disease, type 2 diabetes with neuropathy, chronic diastolic heart failure, obstructive sleep apnea, hypertension, restless leg  syndrome, pulmonary venous hypertension, history of colon CA, pacemaker and apparently venous reflux. We did not do arterial studies in the clinic today we are going to try to get the results that were already done within the last 2 months in [place] 7/26; patient readmitted to the clinic last week with predominantly chronic venous wounds almost circumferentially in the left lower leg and the right leg medially. She also has secondary lymphedema. We put her in compression. She went to the ER in [place] on Friday they remove the wrap on the left leg diagnosed her with cellulitis and put her on doxycycline. They apparently also did a ultrasound that was negative for DVT . We did not get any vascular information from [facility] where she apparently had arterial studies and venous studies. She is currently moving from [place] to [place] is up on her feet quite a bit. She was referred to vein and vascular here but never got to the appointment. ABIs were obtained here at 0.89 on the right and 1.02 on the left 8/9-Patient returns after establishing in clinic last visit, we have been doing 3 layer compression on both sides with calcium alginate, she is following up with the vein and vascular when she gets an appointment. The right side is healed the left side is still got some small open areas She has appt on Wed at Vein and vascular at which point she will come in for Nurse visit here 8/16; the patient arrives with all of her wounds closed. She has bilateral Juzo stockings. She saw Dr. Darrick Penna on 8/11 to review her reflux studies from the same day. On the right she had no evidence of DVT or SVT She did have deep venous reflux in the common femoral vein as well as superficial venous reflux in the . small saphenous vein of the mid calf on the left again no thrombus in the deep vein or superficial veins. Deep vein reflux in the common femoral  vein as well as the great saphenous vein. She saw Dr.  Darrick Penna in consult. Also of note he noted that she had a normal arterial scan done in [place] in April 2021, we were never able to obtain this. His feeling was that the patient did have a dilated greater saphenous vein however minimal to no evidence of reflux in her superficial venous system. She also was felt to have mild deep vein reflux and some degree of lymphedema. He recommended continued compression of the lower extremities with intermittent Unna boots if necessary. She was told to elevate her legs. If she develops recurrent ulcerations they were not opposed to revisiting the repeat venous reflux exam to see if she develops worsening reflux for consideration of laser ablation READMISSION 07/26/2020 Patient is now an 82 year old woman who lives in [place]. We had her in the clinic in the summer with wounds on her bilateral lower legs secondary to chronic venous insufficiency with some degree of lymphedema. She was seen by Dr. Darrick Penna in August. This was predominantly to review her venous reflux. She quoted normal arterial studies done in [place] in April 2021 but I have never been able to see these myself.Marland Kitchen His feeling was the patient did have a bilateral great dilated greater saphenous vein however minimal to no evidence of reflux in her superficial venous system. She was also felt to have mild deep vein reflux and some degree of lymphedema. He recommended compression and leg elevation.Marland Kitchen He was not opposed to repeating her venous reflux studies at some point if she develops worse to see if she develops worsening reflux for consideration of ablation. We discharged the patient with juxta lite stockings bilaterally and she has been compliant with wearing these. Unfortunately in September she had a fall suffering a right tibial fracture requiring an IM nail with a rod. She also had fibular fractures in at least one place. She was sent to skilled facility for rehabilitation she is now back at  home. She had a DVT rule out study on 9/24 that was negative for DVT . She tells [place] that in the last 2 or 3 weeks she is developed blisters on her right lower extremity that ruptured into 2 open wounds. She has not been able to get these to close over. She religiously uses her juxta lite stockings on both legs and she gets them on is tight as she can. Originally the left leg was the larger of the 2 legs but since the patient had surgery the right leg is more swollen and tense than the left. She also tells me she had a second fall yesterday and has had pain in the left dorsal midfoot. She has diabetic neuropathy with Charcot foot left greater than right. Past medical history includes chronic kidney disease, hypertension, type 2 diabetes with peripheral neuropathy, congestive heart failure, hypertension, history of colon CA, lumbar stenosis ABIs in our clinic were noncompressible bilaterally 11/30; the wounds in the right lower extremity are healed anteriorly. She has both Tubigrip and her juxta lite stockings she uses the Tubigrip as the primary layer. We went over this with her today she has the bilateral stockings. She will need to adjust her compression to roughly 30 mmHg [name], [name] ([id number]) [id number].pdf Page 6 of 10 12/25/2022 [name] is a 82 year old female with a past medical history Of CKD stage IV, lymphedema/venous insufficiency, controlled type 2 diabetes that presents the clinic for a 1oo65-month history of bilateral lower extremity wounds. She has difficulty controlling  the edema and is currently on metolazone 2.5 mg once weekly and torsemide 40 mg daily. She sleeps in a recliner. She has been using antibiotic ointment to the wound beds. She states she has Juxta lite compressions which she uses daily. She currently denies signs of infection. Patient History Information obtained from Patient. Allergies penicillin, codeine, adhesive, aspirin,  iodine, Iodinated Contrast Media, Sulfa (Sulfonamide Antibiotics) Family History Diabetes - Siblings, Heart Disease - Mother, Hypertension - Mother, Kidney Disease - Siblings, No family history of Cancer, Hereditary Spherocytosis, Lung Disease, Seizures, Stroke, Thyroid Problems, Tuberculosis. Social History Never smoker, Marital Status - Widowed, Alcohol Use - Rarely, Drug Use - No History, Caffeine Use - Daily. Medical History Eyes Denies history of Cataracts, Glaucoma, Optic Neuritis Ear/Nose/Mouth/Throat Denies history of Chronic sinus problems/congestion, Middle ear problems Hematologic/Lymphatic Patient has history of Lymphedema Denies history of Anemia, Hemophilia, Human Immunodeficiency Virus, Sickle Cell Disease Respiratory Denies history of Aspiration, Asthma, Chronic Obstructive Pulmonary Disease (COPD), Pneumothorax, Sleep Apnea, Tuberculosis Cardiovascular Patient has history of Congestive Heart Failure, Hypertension, Peripheral Venous Disease Denies history of Angina, Arrhythmia, Coronary Artery Disease, Deep Vein Thrombosis, Hypotension, Myocardial Infarction, Peripheral Arterial Disease, Phlebitis, Vasculitis Gastrointestinal Denies history of Cirrhosis , Colitis, Crohnoos, Hepatitis A, Hepatitis B, Hepatitis C Endocrine Patient has history of Type II Diabetes Denies history of Type I Diabetes Genitourinary Denies history of End Stage Renal Disease Immunological Denies history of Lupus Erythematosus, Raynaudoos, Scleroderma Integumentary (Skin) Denies history of History of Burn Musculoskeletal Patient has history of Osteoarthritis Neurologic Patient has history of Neuropathy Denies history of Dementia, Quadriplegia, Paraplegia, Seizure Disorder Oncologic Denies history of Received Chemotherapy, Received Radiation Psychiatric Patient has history of Confinement Anxiety Denies history of Anorexia/bulimia Hospitalization/Surgery History - tibia nail insertion  right. - cardiac cath. - lumbar laminectomy. - pacemaker implanted. - appendectomy. - lumpectomy left breast. - DandC uterus. - partial nephrectomy. - tosillectomy. - tubal ligation. Medical A Surgical History Notes nd Constitutional Symptoms (General Health) obesity Gastrointestinal GERD Endocrine thyroid nodule Genitourinary CKD stage 3 Musculoskeletal fibromyalgia Oncologic hx colon CA Review of Systems (ROS) Musculoskeletal lumbar spinal stenosis Objective Constitutional respirations regular, non-labored and within target range for patient.Marland Kitchen DAMYA, COMLEY ([id number]) [id number] Page 7 of 10 Vitals Time Taken: 12:50 PM, Temperature: 99.0 F, Pulse: 76 bpm, Respiratory Rate: 20 breaths/min, Blood Pressure: 170/74 mmHg. Cardiovascular 2+ dorsalis pedis/posterior tibialis pulses. Psychiatric pleasant and cooperative. General Notes: 1 open wound to the lower extremities bilaterally with granulation tissue and scant nonviable tissue. Significant hemosiderin staining noted throughout the legs. 3+ pitting edema to the knees bilaterally. No signs of surrounding infection including increased warmth, erythema or purulent drainage. Integumentary (Hair, Skin) Wound #5 status is Open. Original cause of wound was Gradually Appeared. The date acquired was: 11/22/2022. The wound is located on the Right,Anterior Lower Leg. The wound measures 0.8cm length x 0.5cm width x 0.2cm depth; 0.314cm^2 area and 0.063cm^3 volume. There is Fat Layer (Subcutaneous Tissue) exposed. There is no tunneling or undermining noted. There is a medium amount of serosanguineous drainage noted. The wound margin is distinct with the outline attached to the wound base. There is large (67-100%) red, pink granulation within the wound bed. There is a small (1-33%) amount of necrotic tissue within the wound bed including Adherent Slough. The periwound skin appearance exhibited: Hemosiderin  Staining. The periwound skin appearance did not exhibit: Callus, Crepitus, Excoriation, Induration, Rash, Scarring, Dry/Scaly, Maceration, Atrophie Blanche, Cyanosis, Ecchymosis, Mottled, Pallor, Rubor, Erythema. Wound #6 status is Open. Original cause of wound was  Blister. The date acquired was: 12/10/2022. The wound is located on the Left,Plantar Foot. The wound measures 0.4cm length x 0.4cm width x 0.1cm depth; 0.126cm^2 area and 0.013cm^3 volume. There is Fat Layer (Subcutaneous Tissue) exposed. There is no tunneling or undermining noted. There is a medium amount of serosanguineous drainage noted. The wound margin is distinct with the outline attached to the wound base. There is medium (34-66%) red, pink granulation within the wound bed. There is a medium (34-66%) amount of necrotic tissue within the wound bed including Adherent Slough. The periwound skin appearance did not exhibit: Callus, Crepitus, Excoriation, Induration, Rash, Scarring, Dry/Scaly, Maceration, Atrophie Blanche, Cyanosis, Ecchymosis, Hemosiderin Staining, Mottled, Pallor, Rubor, Erythema. Assessment Active Problems ICD-10 Non-pressure chronic ulcer of other part of right lower leg with fat layer exposed Non-pressure chronic ulcer of other part of left lower leg with fat layer exposed Chronic venous hypertension (idiopathic) with ulcer of right lower extremity Type 2 diabetes mellitus with other skin ulcer Lymphedema, not elsewhere classified Patient presents with a 1-60-month history of nonhealing ulcers to the lower extremities bilaterally secondary to venous insufficiency and lymphedema. Her ABIs in office were 1.16 on the left and 1.11 on the right. She has palpable pedal pulses. She should have adequate blood flow for healing. For now I recommended Kerlix/Coban with Hydrofera Blue and Santyl. She knows to not get the wraps wet or keep them on for more than 7 days. She sleeps in a recliner with her legs down. I strongly  recommended she elevate her legs. She states she will try to do so while in the recliner. She will follow back up in 1 week. Plan Follow-up Appointments: Return Appointment in 1 week. - Dr Mikey Bussing - Room 7 01/01/2023 @ Anesthetic: Wound #5 Right,Anterior Lower Leg: (In clinic) Topical Lidocaine 4% applied to wound bed Wound #6 Left,Plantar Foot: (In clinic) Topical Lidocaine 4% applied to wound bed Bathing/ Shower/ Hygiene: May shower with protection but do not get wound dressing(s) wet. Protect dressing(s) with water repellant cover (for example, large plastic bag) or a cast cover and may then take shower. Edema Control - Lymphedema / SCD / Other: Elevate legs to the level of the heart or above for 30 minutes daily and/or when sitting for 3-4 times a day throughout the day. Avoid standing for long periods of time. The following medication(s) was prescribed: lidocaine topical 4 % cream cream topical once daily was prescribed at facility WOUND #5: - Lower Leg Wound Laterality: Right, Anterior Cleanser: Soap and Water 1 x Per Week/30 Days Discharge Instructions: May shower and wash wound with dial antibacterial soap and water prior to dressing change. Cleanser: Vashe 5.8 (oz) 1 x Per Week/30 Days Discharge Instructions: Cleanse the wound with Vashe prior to applying a clean dressing using gauze sponges, not tissue or cotton balls. Peri-Wound Care: Sween Lotion (Moisturizing lotion) 1 x Per Week/30 Days Discharge Instructions: Apply moisturizing lotion as directed Prim Dressing: Hydrofera Blue Ready Transfer Foam, 2.5x2.5 (in/in) 1 x Per Week/30 Days ary Discharge Instructions: Apply directly to wound bed as directed Prim Dressing: Santyl Ointment 1 x Per Week/30 Days ary Discharge Instructions: Apply nickel thick amount to wound bed as instructed Secondary Dressing: ABD Pad, 5x9 1 x Per Week/30 Days Discharge Instructions: Apply over primary dressing as directed. Secured With: Coban  Self-Adherent Wrap 4x5 (in/yd) 1 x Per Week/30 Days Discharge Instructions: Secure with Coban as directed. Secured With: [employer], 4.5x3.1 (in/yd) 1 x Per Week/30 Days Discharge Instructions: Secure  with Kerlix as directed. Secured With: 95M Medipore H Soft Cloth Surgical T ape, 4 x 10 (in/yd) 1 x Per Week/30 Days Discharge Instructions: Secure with tape as directed. [name], [name] ([id number]) [id number] Page 8 of 10 Secured With: Unna 1 x Per Week/30 Days Discharge Instructions: to top of wrap to keep from sliding down WOUND #6: - Foot Wound Laterality: Plantar, Left Cleanser: Soap and Water 1 x Per Week/30 Days Discharge Instructions: May shower and wash wound with dial antibacterial soap and water prior to dressing change. Cleanser: Vashe 5.8 (oz) 1 x Per Week/30 Days Discharge Instructions: Cleanse the wound with Vashe prior to applying a clean dressing using gauze sponges, not tissue or cotton balls. Peri-Wound Care: Sween Lotion (Moisturizing lotion) 1 x Per Week/30 Days Discharge Instructions: Apply moisturizing lotion as directed Prim Dressing: Hydrofera Blue Ready Transfer Foam, 2.5x2.5 (in/in) 1 x Per Week/30 Days ary Discharge Instructions: Apply directly to wound bed as directed Prim Dressing: Santyl Ointment 1 x Per Week/30 Days ary Discharge Instructions: Apply nickel thick amount to wound bed as instructed Secondary Dressing: ABD Pad, 5x9 1 x Per Week/30 Days Discharge Instructions: Apply over primary dressing as directed. Secured With: Coban Self-Adherent Wrap 4x5 (in/yd) 1 x Per Week/30 Days Discharge Instructions: Secure with Coban as directed. Secured With: [employer], 4.5x3.1 (in/yd) 1 x Per Week/30 Days Discharge Instructions: Secure with Kerlix as directed. Secured With: 95M Medipore H Soft Cloth Surgical T ape, 4 x 10 (in/yd) 1 x Per Week/30 Days Discharge Instructions: Secure with tape as directed. Secured With: Unna  1 x Per Week/30 Days Discharge Instructions: to top of wrap to keep from sliding down 1. Hydrofera Blue with Santyl under Kerlix/Coban to the lower extremities bilaterally 2. Follow-up in 1 week. Electronic Signature(s) Signed: 12/25/2022 5:13:02 PM By: Shawn Stall RN, BSN Signed: 12/26/2022 9:48:19 AM By: Geralyn Corwin DO Previous Signature: 12/25/2022 2:11:27 PM Version By: Geralyn Corwin DO Entered By: Shawn Stall on 12/25/2022 17:01:57 -------------------------------------------------------------------------------- HxROS Details Patient Name: Date of Service: VAISHALI, BAISE 12/25/2022 12:30 PM Medical Record Number: [id number] Patient Account Number: [id number] Date of Birth/Sex: Treating RN: [birth year] (82 y.o. Ardis Rowan, [name] Primary Care Provider: Aliene Beams Other Clinician: Referring Provider: Treating Provider/Extender: Teodoro Kil Weeks in Treatment: 0 Information Obtained From Patient Constitutional Symptoms (General Health) Medical History: Past Medical History Notes: obesity Eyes Medical History: Negative for: Cataracts; Glaucoma; Optic Neuritis Ear/Nose/Mouth/Throat Medical History: Negative for: Chronic sinus problems/congestion; Middle ear problems Hematologic/Lymphatic Medical History: Positive for: Lymphedema Negative for: Anemia; Hemophilia; Human Immunodeficiency Virus; Sickle Cell Disease Respiratory Medical History: Negative for: Aspiration; Asthma; Chronic Obstructive Pulmonary Disease (COPD); Pneumothorax; Sleep Apnea; Tuberculosis Cardiovascular Medical HistoryRAYYAN, ORSBORN ([id number]) [id number] Page 9 of 10 Positive for: Congestive Heart Failure; Hypertension; Peripheral Venous Disease Negative for: Angina; Arrhythmia; Coronary Artery Disease; Deep Vein Thrombosis; Hypotension; Myocardial Infarction; Peripheral Arterial Disease; Phlebitis; Vasculitis Gastrointestinal Medical  History: Negative for: Cirrhosis ; Colitis; Crohns; Hepatitis A; Hepatitis B; Hepatitis C Past Medical History Notes: GERD Endocrine Medical History: Positive for: Type II Diabetes Negative for: Type I Diabetes Past Medical History Notes: thyroid nodule Time with diabetes: 15 Treated with: Insulin Blood sugar tested every day: Yes Tested : 2-3 times per day Genitourinary Medical History: Negative for: End Stage Renal Disease Past Medical History Notes: CKD stage 3 Immunological Medical History: Negative for: Lupus Erythematosus; Raynauds; Scleroderma Integumentary (Skin) Medical History: Negative for: History of Burn Musculoskeletal Complaints and Symptoms: Review of System Notes: lumbar spinal stenosis  Medical History: Positive for: Osteoarthritis Past Medical History Notes: fibromyalgia Neurologic Medical History: Positive for: Neuropathy Negative for: Dementia; Quadriplegia; Paraplegia; Seizure Disorder Oncologic Medical History: Negative for: Received Chemotherapy; Received Radiation Past Medical History Notes: hx colon CA Psychiatric Medical History: Positive for: Confinement Anxiety Negative for: Anorexia/bulimia Immunizations Pneumococcal Vaccine: Received Pneumococcal Vaccination: No Implantable Devices Yes Hospitalization / Surgery History Type of Hospitalization/Surgery tibia nail insertion right Rigg, Marisah ([id number]) [id number] Page 10 of 10 cardiac cath lumbar laminectomy pacemaker implanted appendectomy lumpectomy left breast DandC uterus partial nephrectomy tosillectomy tubal ligation Family and Social History Cancer: No; Diabetes: Yes - Siblings; Heart Disease: Yes - Mother; Hereditary Spherocytosis: No; Hypertension: Yes - Mother; Kidney Disease: Yes - Siblings; Lung Disease: No; Seizures: No; Stroke: No; Thyroid Problems: No; Tuberculosis: No; Never smoker; Marital Status - Widowed; Alcohol Use:  Rarely; Drug Use: No History; Caffeine Use: Daily; Financial Concerns: No; Food, Clothing or Shelter Needs: No; Support System Lacking: No; Transportation Concerns: No Electronic Signature(s) Signed: 12/25/2022 2:11:27 PM By: Geralyn Corwin DO Signed: 12/25/2022 3:49:55 PM By: Redmond Pulling RN, BSN Signed: 12/28/2022 11:19:08 AM By: Fonnie Mu RN Previous Signature: 12/25/2022 10:18:30 AM Version By: Geralyn Corwin DO Previous Signature: 12/25/2022 11:10:16 AM Version By: Fonnie Mu RN Entered By: Redmond Pulling on 12/25/2022 13:03:08 -------------------------------------------------------------------------------- SuperBill Details Patient Name: Date of Service: KAELENE, ELLISTON 12/25/2022 Medical Record Number: [id number] Patient Account Number: [id number] Date of Birth/Sex: Treating RN: 12/01/1940 (82 y.o. F) Primary Care Provider: Aliene Beams Other Clinician: Referring Provider: Treating Provider/Extender: Teodoro Kil Weeks in Treatment: 0 Diagnosis Coding ICD-10 Codes Code Description [phone] Non-pressure chronic ulcer of other part of right lower leg with fat layer exposed L97.822 Non-pressure chronic ulcer of other part of left lower leg with fat layer exposed I87.311 Chronic venous hypertension (idiopathic) with ulcer of right lower extremity E11.622 Type 2 diabetes mellitus with other skin ulcer I89.0 Lymphedema, not elsewhere classified Facility Procedures : CPT4 Code: [id number] Description: 99214 - WOUND CARE VISIT-LEV 4 EST PT Modifier: 24 Quantity: 1 Physician Procedures : CPT4 Code Description Modifier [id number] - WC PHYS LEVEL 4 - EST PT ICD-10 Diagnosis Description L97.812 Non-pressure chronic ulcer of other part of right lower leg with fat layer exposed L97.822 Non-pressure chronic ulcer of other part of left  lower leg with fat layer exposed I87.311 Chronic venous hypertension (idiopathic) with ulcer of right lower extremity  E11.622 Type 2 diabetes mellitus with other skin ulcer Quantity: 1 Electronic Signature(s) Signed: 12/25/2022 2:11:27 PM By: Geralyn Corwin DO Signed: 12/25/2022 3:49:55 PM By: Redmond Pulling RN, BSN Entered By: Redmond Pulling on 12/25/2022 13:53:33

## 2022-12-31 ENCOUNTER — Ambulatory Visit (HOSPITAL_BASED_OUTPATIENT_CLINIC_OR_DEPARTMENT_OTHER): Payer: Medicare Other | Admitting: Internal Medicine

## 2023-01-01 ENCOUNTER — Encounter (HOSPITAL_BASED_OUTPATIENT_CLINIC_OR_DEPARTMENT_OTHER): Payer: Medicare Other | Admitting: Internal Medicine

## 2023-01-01 DIAGNOSIS — Z85038 Personal history of other malignant neoplasm of large intestine: Secondary | ICD-10-CM | POA: Diagnosis not present

## 2023-01-01 DIAGNOSIS — Z833 Family history of diabetes mellitus: Secondary | ICD-10-CM | POA: Diagnosis not present

## 2023-01-01 DIAGNOSIS — L97822 Non-pressure chronic ulcer of other part of left lower leg with fat layer exposed: Secondary | ICD-10-CM

## 2023-01-01 DIAGNOSIS — Z8249 Family history of ischemic heart disease and other diseases of the circulatory system: Secondary | ICD-10-CM | POA: Diagnosis not present

## 2023-01-01 DIAGNOSIS — E11622 Type 2 diabetes mellitus with other skin ulcer: Secondary | ICD-10-CM | POA: Diagnosis not present

## 2023-01-01 DIAGNOSIS — I5032 Chronic diastolic (congestive) heart failure: Secondary | ICD-10-CM | POA: Diagnosis not present

## 2023-01-01 DIAGNOSIS — E1122 Type 2 diabetes mellitus with diabetic chronic kidney disease: Secondary | ICD-10-CM | POA: Diagnosis not present

## 2023-01-01 DIAGNOSIS — I130 Hypertensive heart and chronic kidney disease with heart failure and stage 1 through stage 4 chronic kidney disease, or unspecified chronic kidney disease: Secondary | ICD-10-CM | POA: Diagnosis not present

## 2023-01-01 DIAGNOSIS — I890 Lymphedema, not elsewhere classified: Secondary | ICD-10-CM | POA: Diagnosis not present

## 2023-01-01 DIAGNOSIS — N184 Chronic kidney disease, stage 4 (severe): Secondary | ICD-10-CM | POA: Diagnosis not present

## 2023-01-01 DIAGNOSIS — L97812 Non-pressure chronic ulcer of other part of right lower leg with fat layer exposed: Secondary | ICD-10-CM | POA: Diagnosis not present

## 2023-01-01 DIAGNOSIS — I87311 Chronic venous hypertension (idiopathic) with ulcer of right lower extremity: Secondary | ICD-10-CM

## 2023-01-01 DIAGNOSIS — E1142 Type 2 diabetes mellitus with diabetic polyneuropathy: Secondary | ICD-10-CM | POA: Diagnosis not present

## 2023-01-01 DIAGNOSIS — E1151 Type 2 diabetes mellitus with diabetic peripheral angiopathy without gangrene: Secondary | ICD-10-CM | POA: Diagnosis not present

## 2023-01-01 DIAGNOSIS — Z950 Presence of cardiac pacemaker: Secondary | ICD-10-CM | POA: Diagnosis not present

## 2023-01-01 NOTE — Progress Notes (Addendum)
[name] ([id number]) [id number] Page 1 of 10 Visit Report for 01/01/2023 Chief Complaint Document Details Patient Name: Date of Service: [name], [name] 01/01/2023 11:15 A M Medical Record Number: [id number] Patient Account Number: [id number] Date of Birth/Sex: Treating RN: 17-Dec-1940 (82 y.o. F) Primary Care Provider: Aliene Beams Other Clinician: Referring Provider: Treating Provider/Extender: Marguerite Olea in Treatment: 1 Information Obtained from: Patient Chief Complaint 03/28/2020; patient is here for review of wounds on her bilateral lower legs 12/25/2022; bilateral lower extremity wound Electronic Signature(s) Signed: 01/01/2023 12:19:49 PM By: Geralyn Corwin DO Entered By: Geralyn Corwin on 01/01/2023 12:17:11 -------------------------------------------------------------------------------- HPI Details Patient Name: Date of Service: [name] 01/01/2023 11:15 A M Medical Record Number: [id number] Patient Account Number: [id number] Date of Birth/Sex: Treating RN: 1941-06-12 (82 y.o. F) Primary Care Provider: Aliene Beams Other Clinician: Referring Provider: Treating Provider/Extender: Marguerite Olea in Treatment: 1 History of Present Illness HPI Description: ADMISSION 03/28/2020 This is a 82 year old woman who is accompanied by her daughter. She is moving to [place] from [place] where she lives. Infectious been going to the wound care center in [place] for the last 2 months. She apparently did not tolerate compression early on in that clinic stay. She is just using dry gauze over the wounds when she came in today. She tells me she has had both arterial and venous reflux studies although she does not have copies of these. She was apparently offered an ablation but I have no further information on that. She also is a type II diabetic. She was noted to have wounds on her  lower extremities during a CHF clinic visit with Dr. Gala Romney and she was referred here. She has several scattered areas on the right medial lower leg and a large area of superficial ulceration on the posterior medial left lower leg. past medical history; chronic kidney disease stage III, gastroesophageal reflux disease, type 2 diabetes with neuropathy, chronic diastolic heart failure, obstructive sleep apnea, hypertension, restless leg syndrome, pulmonary venous hypertension, history of colon CA, pacemaker and apparently venous reflux. We did not do arterial studies in the clinic today we are going to try to get the results that were already done within the last 2 months in [place] 7/26; patient readmitted to the clinic last week with predominantly chronic venous wounds almost circumferentially in the left lower leg and the right leg medially. She also has secondary lymphedema. We put her in compression. She went to the ER in [place] on Friday they remove the wrap on the left leg diagnosed her with cellulitis and put her on doxycycline. They apparently also did a ultrasound that was negative for DVT . We did not get any vascular information from [facility] where she apparently had arterial studies and venous studies. She is currently moving from [place] to [place] is up on her feet quite a bit. She was referred to vein and vascular here but never got to the appointment. ABIs were obtained here at 0.89 on the right and 1.02 on the left 8/9-Patient returns after establishing in clinic last visit, we have been doing 3 layer compression on both sides with calcium alginate, she is following up with the vein and vascular when she gets an appointment. The right side is healed the left side is still got some small open areas She has appt on Wed at Vein and vascular at which point she will come in for Nurse visit here 8/16; the patient arrives with all of her  wounds closed. She has bilateral  Juzo stockings. She saw Dr. Darrick Penna on 8/11 to review her reflux studies from the same day. On the right she had no evidence of DVT or SVT She did have deep venous reflux in the common femoral vein as well as superficial venous reflux in the . small saphenous vein of the mid calf on the left again no thrombus in the deep vein or superficial veins. Deep vein reflux in the common femoral vein as well as the great saphenous vein. She saw Dr. Darrick Penna in consult. Also of note he noted that she had a normal arterial scan done in [place] in April 2021, we were never able to obtain this. His feeling was that the patient did have a dilated greater saphenous vein however minimal to no evidence of reflux in her superficial venous system. She also was felt to have mild deep vein reflux and some degree of lymphedema. He recommended continued compression of the lower extremities with intermittent Unna boots if necessary. She was told to elevate her legs. If she develops recurrent ulcerations they were not opposed to revisiting the repeat venous reflux exam to see if she develops worsening reflux for consideration of laser ablation READMISSION 07/26/2020 [name], [name] ([id number]) [id number] Page 2 of 10 Patient is now an 82 year old woman who lives in [place]. We had her in the clinic in the summer with wounds on her bilateral lower legs secondary to chronic venous insufficiency with some degree of lymphedema. She was seen by Dr. Darrick Penna in August. This was predominantly to review her venous reflux. She quoted normal arterial studies done in [place] in April 2021 but I have never been able to see these myself.Marland Kitchen His feeling was the patient did have a bilateral great dilated greater saphenous vein however minimal to no evidence of reflux in her superficial venous system. She was also felt to have mild deep vein reflux and some degree of lymphedema. He recommended compression and leg  elevation.Marland Kitchen He was not opposed to repeating her venous reflux studies at some point if she develops worse to see if she develops worsening reflux for consideration of ablation. We discharged the patient with juxta lite stockings bilaterally and she has been compliant with wearing these. Unfortunately in September she had a fall suffering a right tibial fracture requiring an IM nail with a rod. She also had fibular fractures in at least one place. She was sent to skilled facility for rehabilitation she is now back at home. She had a DVT rule out study on 9/24 that was negative for DVT . She tells [place] that in the last 2 or 3 weeks she is developed blisters on her right lower extremity that ruptured into 2 open wounds. She has not been able to get these to close over. She religiously uses her juxta lite stockings on both legs and she gets them on is tight as she can. Originally the left leg was the larger of the 2 legs but since the patient had surgery the right leg is more swollen and tense than the left. She also tells me she had a second fall yesterday and has had pain in the left dorsal midfoot. She has diabetic neuropathy with Charcot foot left greater than right. Past medical history includes chronic kidney disease, hypertension, type 2 diabetes with peripheral neuropathy, congestive heart failure, hypertension, history of colon CA, lumbar stenosis ABIs in our clinic were noncompressible bilaterally 11/30; the wounds in the right lower extremity are healed anteriorly.  She has both Tubigrip and her juxta lite stockings she uses the Tubigrip as the primary layer. We went over this with her today she has the bilateral stockings. She will need to adjust her compression to roughly 30 mmHg 12/25/2022 Mrs. Breindy Meadow is a 82 year old female with a past medical history Of CKD stage IV, lymphedema/venous insufficiency, controlled type 2 diabetes that presents the clinic for a 40-month history of  bilateral lower extremity wounds. She has difficulty controlling the edema and is currently on metolazone 2.5 mg once weekly and torsemide 40 mg daily. She sleeps in a recliner. She has been using antibiotic ointment to the wound beds. She states she has Juxta lite compressions which she uses daily. She currently denies signs of infection. 4/23; patient presents for follow-up. We have been using Santyl and Hydrofera Blue under Kerlix/Coban to the lower extremities bilaterally. She tolerated this well. Electronic Signature(s) Signed: 01/01/2023 12:19:49 PM By: Geralyn Corwin DO Entered By: Geralyn Corwin on 01/01/2023 12:17:38 -------------------------------------------------------------------------------- Physical Exam Details Patient Name: Date of Service: LABELLA, ZAHRADNIK 01/01/2023 11:15 A M Medical Record Number: [id number] Patient Account Number: [id number] Date of Birth/Sex: Treating RN: 1940/09/11 (82 y.o. F) Primary Care Provider: Aliene Beams Other Clinician: Referring Provider: Treating Provider/Extender: Marguerite Olea in Treatment: 1 Constitutional respirations regular, non-labored and within target range for patient.. Cardiovascular 2+ dorsalis pedis/posterior tibialis pulses. Psychiatric pleasant and cooperative. Notes 1 open wound to the lower extremities bilaterally with granulation tissue and scant nonviable tissue. Significant hemosiderin staining noted throughout the legs. 2+ pitting edema to the knees bilaterally. No signs of surrounding infection including increased warmth, erythema or purulent drainage. Electronic Signature(s) Signed: 01/01/2023 12:19:49 PM By: Geralyn Corwin DO Entered By: Geralyn Corwin on 01/01/2023 12:18:24 -------------------------------------------------------------------------------- Physician Orders Details Patient Name: Date of Service: KYLIEE, ORTEGO 01/01/2023 11:15 A M Medical Record Number:  [id number] Patient Account Number: [id number] Date of Birth/Sex: Treating RN: 12/09/1940 (82 y.o. [place] Govern Primary Care Provider: Aliene Beams Other Clinician: Referring Provider: Treating Provider/Extender: Marguerite Olea in Treatment: 1 Verbal / Phone OrdersMariea Stable ([id number]) [id number] Page 3 of 10 Diagnosis Coding Follow-up Appointments ppointment in 1 week. - Dr Mikey Bussing - Room 7 01/08/23 @ 2:00 Return A Anesthetic Wound #5 Right,Anterior Lower Leg (In clinic) Topical Lidocaine 4% applied to wound bed Wound #6 Left,Dorsal Foot (In clinic) Topical Lidocaine 4% applied to wound bed Bathing/ Shower/ Hygiene May shower with protection but do not get wound dressing(s) wet. Protect dressing(s) with water repellant cover (for example, large plastic bag) or a cast cover and may then take shower. Edema Control - Lymphedema / SCD / Other Bilateral Lower Extremities Elevate legs to the level of the heart or above for 30 minutes daily and/or when sitting for 3-4 times a day throughout the day. Avoid standing for long periods of time. Wound Treatment Wound #5 - Lower Leg Wound Laterality: Right, Anterior Cleanser: Soap and Water 1 x Per Week/30 Days Discharge Instructions: May shower and wash wound with dial antibacterial soap and water prior to dressing change. Cleanser: Vashe 5.8 (oz) 1 x Per Week/30 Days Discharge Instructions: Cleanse the wound with Vashe prior to applying a clean dressing using gauze sponges, not tissue or cotton balls. Peri-Wound Care: Sween Lotion (Moisturizing lotion) 1 x Per Week/30 Days Discharge Instructions: Apply moisturizing lotion as directed Prim Dressing: Maxorb Extra Ag+ Alginate Dressing, 4x4.75 (in/in) 1 x Per Week/30 Days ary Discharge Instructions: Apply to wound bed as  instructed Secondary Dressing: ABD Pad, 5x9 1 x Per Week/30 Days Discharge Instructions: Apply over primary  dressing as directed. Secured With: Unna 1 x Per Week/30 Days Discharge Instructions: to top of wrap to keep from sliding down Compression Wrap: Urgo K2 Lite, two layer compression system, regular 1 x Per Week/30 Days Discharge Instructions: Apply Urgo K2 Lite as directed (alternative to 3 layer compression). Wound #6 - Foot Wound Laterality: Dorsal, Left Cleanser: Soap and Water 1 x Per Week/30 Days Discharge Instructions: May shower and wash wound with dial antibacterial soap and water prior to dressing change. Cleanser: Vashe 5.8 (oz) 1 x Per Week/30 Days Discharge Instructions: Cleanse the wound with Vashe prior to applying a clean dressing using gauze sponges, not tissue or cotton balls. Peri-Wound Care: Sween Lotion (Moisturizing lotion) 1 x Per Week/30 Days Discharge Instructions: Apply moisturizing lotion as directed Prim Dressing: Maxorb Extra Ag+ Alginate Dressing, 4x4.75 (in/in) 1 x Per Week/30 Days ary Discharge Instructions: Apply to wound bed as instructed Secondary Dressing: ABD Pad, 5x9 1 x Per Week/30 Days Discharge Instructions: Apply over primary dressing as directed. Secured With: Unna 1 x Per Week/30 Days Discharge Instructions: to top of wrap to keep from sliding down Compression Wrap: Urgo K2 Lite, two layer compression system, regular 1 x Per Week/30 Days Discharge Instructions: Apply Urgo K2 Lite as directed (alternative to 3 layer compression). Patient Medications llergies: penicillin, codeine, adhesive, aspirin, iodine, Iodinated Contrast Media, Sulfa (Sulfonamide Antibiotics) A Notifications Medication Indication Start End 01/01/2023 lidocaine DOSE topical 4 % cream - cream topical once daily Hanken, Sheritta ([id number]) [phone].pdf Page 4 of 10 Electronic Signature(s) Signed: 01/01/2023 12:19:49 PM By: Geralyn Corwin DO Entered By: Geralyn Corwin on 01/01/2023  12:18:31 -------------------------------------------------------------------------------- Problem List Details Patient Name: Date of Service: SMRITI, BARKOW 01/01/2023 11:15 A M Medical Record Number: [id number] Patient Account Number: [id number] Date of Birth/Sex: Treating RN: Feb 19, 1941 (82 y.o. F) Primary Care Provider: Aliene Beams Other Clinician: Referring Provider: Treating Provider/Extender: Marguerite Olea in Treatment: 1 Active Problems ICD-10 Encounter Code Description Active Date MDM Diagnosis L97.812 Non-pressure chronic ulcer of other part of right lower leg with fat layer 12/25/2022 No Yes exposed L97.822 Non-pressure chronic ulcer of other part of left lower leg with fat layer exposed4/16/2024 No Yes I87.311 Chronic venous hypertension (idiopathic) with ulcer of right lower extremity 12/25/2022 No Yes E11.622 Type 2 diabetes mellitus with other skin ulcer 12/25/2022 No Yes I89.0 Lymphedema, not elsewhere classified 12/25/2022 No Yes Inactive Problems Resolved Problems Electronic Signature(s) Signed: 01/01/2023 12:19:49 PM By: Geralyn Corwin DO Entered By: Geralyn Corwin on 01/01/2023 12:16:58 -------------------------------------------------------------------------------- Progress Note Details Patient Name: Date of Service: KEVA, DARTY 01/01/2023 11:15 A M Medical Record Number: [id number] Patient Account Number: [id number] Date of Birth/Sex: Treating RN: 1941-03-13 (82 y.o. F) Primary Care Provider: Aliene Beams Other Clinician: Referring Provider: Treating Provider/Extender: Marguerite Olea in Treatment: 1 Subjective Chief Complaint Information obtained from Patient 03/28/2020; patient is here for review of wounds on her bilateral lower legs 12/25/2022; bilateral lower extremity wound History of Present Illness (HPI) ADMISSION 03/28/2020 [name] ([id number]) [id number] Page  5 of 10 This is a 82 year old woman who is accompanied by her daughter. She is moving to [place] from [place] where she lives. Infectious been going to the wound care center in [place] for the last 2 months. She apparently did not tolerate compression early on in that clinic stay. She is just using dry gauze over the wounds when she  came in today. She tells me she has had both arterial and venous reflux studies although she does not have copies of these. She was apparently offered an ablation but I have no further information on that. She also is a type II diabetic. She was noted to have wounds on her lower extremities during a CHF clinic visit with Dr. Gala Romney and she was referred here. She has several scattered areas on the right medial lower leg and a large area of superficial ulceration on the posterior medial left lower leg. past medical history; chronic kidney disease stage III, gastroesophageal reflux disease, type 2 diabetes with neuropathy, chronic diastolic heart failure, obstructive sleep apnea, hypertension, restless leg syndrome, pulmonary venous hypertension, history of colon CA, pacemaker and apparently venous reflux. We did not do arterial studies in the clinic today we are going to try to get the results that were already done within the last 2 months in [place] 7/26; patient readmitted to the clinic last week with predominantly chronic venous wounds almost circumferentially in the left lower leg and the right leg medially. She also has secondary lymphedema. We put her in compression. She went to the ER in [place] on Friday they remove the wrap on the left leg diagnosed her with cellulitis and put her on doxycycline. They apparently also did a ultrasound that was negative for DVT . We did not get any vascular information from [facility] where she apparently had arterial studies and venous studies. She is currently moving from [place] to [place] is up on  her feet quite a bit. She was referred to vein and vascular here but never got to the appointment. ABIs were obtained here at 0.89 on the right and 1.02 on the left 8/9-Patient returns after establishing in clinic last visit, we have been doing 3 layer compression on both sides with calcium alginate, she is following up with the vein and vascular when she gets an appointment. The right side is healed the left side is still got some small open areas She has appt on Wed at Vein and vascular at which point she will come in for Nurse visit here 8/16; the patient arrives with all of her wounds closed. She has bilateral Juzo stockings. She saw Dr. Darrick Penna on 8/11 to review her reflux studies from the same day. On the right she had no evidence of DVT or SVT She did have deep venous reflux in the common femoral vein as well as superficial venous reflux in the . small saphenous vein of the mid calf on the left again no thrombus in the deep vein or superficial veins. Deep vein reflux in the common femoral vein as well as the great saphenous vein. She saw Dr. Darrick Penna in consult. Also of note he noted that she had a normal arterial scan done in [place] in April 2021, we were never able to obtain this. His feeling was that the patient did have a dilated greater saphenous vein however minimal to no evidence of reflux in her superficial venous system. She also was felt to have mild deep vein reflux and some degree of lymphedema. He recommended continued compression of the lower extremities with intermittent Unna boots if necessary. She was told to elevate her legs. If she develops recurrent ulcerations they were not opposed to revisiting the repeat venous reflux exam to see if she develops worsening reflux for consideration of laser ablation READMISSION 07/26/2020 Patient is now an 82 year old woman who lives in [place]. We had her in  the clinic in the summer with wounds on her bilateral lower legs secondary  to chronic venous insufficiency with some degree of lymphedema. She was seen by Dr. Darrick Penna in August. This was predominantly to review her venous reflux. She quoted normal arterial studies done in [place] in April 2021 but I have never been able to see these myself.Marland Kitchen His feeling was the patient did have a bilateral great dilated greater saphenous vein however minimal to no evidence of reflux in her superficial venous system. She was also felt to have mild deep vein reflux and some degree of lymphedema. He recommended compression and leg elevation.Marland Kitchen He was not opposed to repeating her venous reflux studies at some point if she develops worse to see if she develops worsening reflux for consideration of ablation. We discharged the patient with juxta lite stockings bilaterally and she has been compliant with wearing these. Unfortunately in September she had a fall suffering a right tibial fracture requiring an IM nail with a rod. She also had fibular fractures in at least one place. She was sent to skilled facility for rehabilitation she is now back at home. She had a DVT rule out study on 9/24 that was negative for DVT . She tells [place] that in the last 2 or 3 weeks she is developed blisters on her right lower extremity that ruptured into 2 open wounds. She has not been able to get these to close over. She religiously uses her juxta lite stockings on both legs and she gets them on is tight as she can. Originally the left leg was the larger of the 2 legs but since the patient had surgery the right leg is more swollen and tense than the left. She also tells me she had a second fall yesterday and has had pain in the left dorsal midfoot. She has diabetic neuropathy with Charcot foot left greater than right. Past medical history includes chronic kidney disease, hypertension, type 2 diabetes with peripheral neuropathy, congestive heart failure, hypertension, history of colon CA, lumbar stenosis ABIs in our  clinic were noncompressible bilaterally 11/30; the wounds in the right lower extremity are healed anteriorly. She has both Tubigrip and her juxta lite stockings she uses the Tubigrip as the primary layer. We went over this with her today she has the bilateral stockings. She will need to adjust her compression to roughly 30 mmHg 12/25/2022 Mrs. Kalis Friese is a 82 year old female with a past medical history Of CKD stage IV, lymphedema/venous insufficiency, controlled type 2 diabetes that presents the clinic for a 1oo71-month history of bilateral lower extremity wounds. She has difficulty controlling the edema and is currently on metolazone 2.5 mg once weekly and torsemide 40 mg daily. She sleeps in a recliner. She has been using antibiotic ointment to the wound beds. She states she has Juxta lite compressions which she uses daily. She currently denies signs of infection. 4/23; patient presents for follow-up. We have been using Santyl and Hydrofera Blue under Kerlix/Coban to the lower extremities bilaterally. She tolerated this well. Patient History Information obtained from Patient. Family History Diabetes - Siblings, Heart Disease - Mother, Hypertension - Mother, Kidney Disease - Siblings, No family history of Cancer, Hereditary Spherocytosis, Lung Disease, Seizures, Stroke, Thyroid Problems, Tuberculosis. Social History Never smoker, Marital Status - Widowed, Alcohol Use - Rarely, Drug Use - No History, Caffeine Use - Daily. Medical History Eyes Denies history of Cataracts, Glaucoma, Optic Neuritis Ear/Nose/Mouth/Throat Denies history of Chronic sinus problems/congestion, Middle ear problems Hematologic/Lymphatic Patient has  history of Lymphedema Denies history of Anemia, Hemophilia, Human Immunodeficiency Virus, Sickle Cell Disease Respiratory Babich, Virdia ([id number]) [phone].pdf Page 6 of 10 Denies history of Aspiration, Asthma, Chronic Obstructive  Pulmonary Disease (COPD), Pneumothorax, Sleep Apnea, Tuberculosis Cardiovascular Patient has history of Congestive Heart Failure, Hypertension, Peripheral Venous Disease Denies history of Angina, Arrhythmia, Coronary Artery Disease, Deep Vein Thrombosis, Hypotension, Myocardial Infarction, Peripheral Arterial Disease, Phlebitis, Vasculitis Gastrointestinal Denies history of Cirrhosis , Colitis, Crohnoos, Hepatitis A, Hepatitis B, Hepatitis C Endocrine Patient has history of Type II Diabetes Denies history of Type I Diabetes Genitourinary Denies history of End Stage Renal Disease Immunological Denies history of Lupus Erythematosus, Raynaudoos, Scleroderma Integumentary (Skin) Denies history of History of Burn Musculoskeletal Patient has history of Osteoarthritis Neurologic Patient has history of Neuropathy Denies history of Dementia, Quadriplegia, Paraplegia, Seizure Disorder Oncologic Denies history of Received Chemotherapy, Received Radiation Psychiatric Patient has history of Confinement Anxiety Denies history of Anorexia/bulimia Hospitalization/Surgery History - tibia nail insertion right. - cardiac cath. - lumbar laminectomy. - pacemaker implanted. - appendectomy. - lumpectomy left breast. - DandC uterus. - partial nephrectomy. - tosillectomy. - tubal ligation. Medical A Surgical History Notes nd Constitutional Symptoms (General Health) obesity Gastrointestinal GERD Endocrine thyroid nodule Genitourinary CKD stage 3 Musculoskeletal fibromyalgia Oncologic hx colon CA Objective Constitutional respirations regular, non-labored and within target range for patient.. Vitals Time Taken: 11:17 AM, Temperature: 98.6 F, Pulse: 85 bpm, Respiratory Rate: 20 breaths/min, Blood Pressure: 167/69 mmHg. Cardiovascular 2+ dorsalis pedis/posterior tibialis pulses. Psychiatric pleasant and cooperative. General Notes: 1 open wound to the lower extremities bilaterally with  granulation tissue and scant nonviable tissue. Significant hemosiderin staining noted throughout the legs. 2+ pitting edema to the knees bilaterally. No signs of surrounding infection including increased warmth, erythema or purulent drainage. Integumentary (Hair, Skin) Wound #5 status is Open. Original cause of wound was Gradually Appeared. The date acquired was: 11/22/2022. The wound has been in treatment 1 weeks. The wound is located on the Right,Anterior Lower Leg. The wound measures 1.4cm length x 1cm width x 0.1cm depth; 1.1cm^2 area and 0.11cm^3 volume. There is Fat Layer (Subcutaneous Tissue) exposed. There is no tunneling or undermining noted. There is a medium amount of serosanguineous drainage noted. The wound margin is distinct with the outline attached to the wound base. There is large (67-100%) red, pink granulation within the wound bed. There is a small (1- 33%) amount of necrotic tissue within the wound bed including Adherent Slough. The periwound skin appearance exhibited: Hemosiderin Staining. The periwound skin appearance did not exhibit: Callus, Crepitus, Excoriation, Induration, Rash, Scarring, Dry/Scaly, Maceration, Atrophie Blanche, Cyanosis, Ecchymosis, Mottled, Pallor, Rubor, Erythema. Wound #6 status is Open. Original cause of wound was Blister. The date acquired was: 12/10/2022. The wound has been in treatment 1 weeks. The wound is located on the Left,Dorsal Foot. The wound measures 0cm length x 0cm width x 0cm depth; 0cm^2 area and 0cm^3 volume. There is Fat Layer (Subcutaneous Tissue) exposed. There is no tunneling or undermining noted. There is a none present amount of drainage noted. The wound margin is distinct with the outline attached to the wound base. There is no granulation within the wound bed. There is a large (67-100%) amount of necrotic tissue within the wound bed including Eschar. The periwound skin appearance did not exhibit: Callus, Crepitus, Excoriation,  Induration, Rash, Scarring, Dry/Scaly, Maceration, Atrophie Blanche, Cyanosis, Ecchymosis, Hemosiderin Staining, Mottled, Pallor, Rubor, Erythema. KATIELYNN, HORAN ([id number]) [id number] Page 7 of 10 Assessment Active Problems ICD-10 Non-pressure chronic ulcer of other  part of right lower leg with fat layer exposed Non-pressure chronic ulcer of other part of left lower leg with fat layer exposed Chronic venous hypertension (idiopathic) with ulcer of right lower extremity Type 2 diabetes mellitus with other skin ulcer Lymphedema, not elsewhere classified Wounds are stable. Swelling is still uncontrolled. I recommended going up to 3 layer compression. Will switch the dressing to silver alginate from [facility]. Follow-up in 1 week. Procedures Wound #5 Pre-procedure diagnosis of Wound #5 is a Lymphedema located on the Right,Anterior Lower Leg . There was a Three Layer Compression Therapy Procedure by Redmond Pulling, RN. Post procedure Diagnosis Wound #5: Same as Pre-Procedure Wound #6 Pre-procedure diagnosis of Wound #6 is a Diabetic Wound/Ulcer of the Lower Extremity located on the Left,Dorsal Foot . There was a Three Layer Compression Therapy Procedure by Redmond Pulling, RN. Post procedure Diagnosis Wound #6: Same as Pre-Procedure Plan Follow-up Appointments: Return Appointment in 1 week. - Dr Mikey Bussing - Room 7 01/08/23 @ 2:00 Anesthetic: Wound #5 Right,Anterior Lower Leg: (In clinic) Topical Lidocaine 4% applied to wound bed Wound #6 Left,Dorsal Foot: (In clinic) Topical Lidocaine 4% applied to wound bed Bathing/ Shower/ Hygiene: May shower with protection but do not get wound dressing(s) wet. Protect dressing(s) with water repellant cover (for example, large plastic bag) or a cast cover and may then take shower. Edema Control - Lymphedema / SCD / Other: Elevate legs to the level of the heart or above for 30 minutes daily and/or when sitting for 3-4  times a day throughout the day. Avoid standing for long periods of time. The following medication(s) was prescribed: lidocaine topical 4 % cream cream topical once daily was prescribed at facility WOUND #5: - Lower Leg Wound Laterality: Right, Anterior Cleanser: Soap and Water 1 x Per Week/30 Days Discharge Instructions: May shower and wash wound with dial antibacterial soap and water prior to dressing change. Cleanser: Vashe 5.8 (oz) 1 x Per Week/30 Days Discharge Instructions: Cleanse the wound with Vashe prior to applying a clean dressing using gauze sponges, not tissue or cotton balls. Peri-Wound Care: Sween Lotion (Moisturizing lotion) 1 x Per Week/30 Days Discharge Instructions: Apply moisturizing lotion as directed Prim Dressing: Maxorb Extra Ag+ Alginate Dressing, 4x4.75 (in/in) 1 x Per Week/30 Days ary Discharge Instructions: Apply to wound bed as instructed Secondary Dressing: ABD Pad, 5x9 1 x Per Week/30 Days Discharge Instructions: Apply over primary dressing as directed. Secured With: Unna 1 x Per Week/30 Days Discharge Instructions: to top of wrap to keep from sliding down Com pression Wrap: Urgo K2 Lite, two layer compression system, regular 1 x Per Week/30 Days Discharge Instructions: Apply Urgo K2 Lite as directed (alternative to 3 layer compression). WOUND #6: - Foot Wound Laterality: Dorsal, Left Cleanser: Soap and Water 1 x Per Week/30 Days Discharge Instructions: May shower and wash wound with dial antibacterial soap and water prior to dressing change. Cleanser: Vashe 5.8 (oz) 1 x Per Week/30 Days Discharge Instructions: Cleanse the wound with Vashe prior to applying a clean dressing using gauze sponges, not tissue or cotton balls. Peri-Wound Care: Sween Lotion (Moisturizing lotion) 1 x Per Week/30 Days Discharge Instructions: Apply moisturizing lotion as directed Prim Dressing: Maxorb Extra Ag+ Alginate Dressing, 4x4.75 (in/in) 1 x Per Week/30 Days ary Discharge  Instructions: Apply to wound bed as instructed Secondary Dressing: ABD Pad, 5x9 1 x Per Week/30 Days Discharge Instructions: Apply over primary dressing as directed. Secured With: Unna 1 x Per Week/30 Days Discharge Instructions: to top of  wrap to keep from sliding down Com pression Wrap: Urgo K2 Lite, two layer compression system, regular 1 x Per Week/30 Days Discharge Instructions: Apply Urgo K2 Lite as directed (alternative to 3 layer compression). 1. Silver alginate under 3 layer compression to the lower extremities bilaterally Provencher, Deandria ([id number]) [phone].pdf Page 8 of 10 2. Follow-up in 1 week Electronic Signature(s) Signed: 01/01/2023 5:19:30 PM By: Shawn Stall RN, BSN Signed: 01/02/2023 9:23:26 AM By: Geralyn Corwin DO Previous Signature: 01/01/2023 12:19:49 PM Version By: Geralyn Corwin DO Entered By: Shawn Stall on 01/01/2023 16:52:52 -------------------------------------------------------------------------------- HxROS Details Patient Name: Date of Service: SHARNELL, KNIGHT 01/01/2023 11:15 A M Medical Record Number: [id number] Patient Account Number: [id number] Date of Birth/Sex: Treating RN: Jan 16, 1941 (82 y.o. F) Primary Care Provider: Aliene Beams Other Clinician: Referring Provider: Treating Provider/Extender: Marguerite Olea in Treatment: 1 Information Obtained From Patient Constitutional Symptoms (General Health) Medical History: Past Medical History Notes: obesity Eyes Medical History: Negative for: Cataracts; Glaucoma; Optic Neuritis Ear/Nose/Mouth/Throat Medical History: Negative for: Chronic sinus problems/congestion; Middle ear problems Hematologic/Lymphatic Medical History: Positive for: Lymphedema Negative for: Anemia; Hemophilia; Human Immunodeficiency Virus; Sickle Cell Disease Respiratory Medical History: Negative for: Aspiration; Asthma; Chronic Obstructive Pulmonary Disease (COPD);  Pneumothorax; Sleep Apnea; Tuberculosis Cardiovascular Medical History: Positive for: Congestive Heart Failure; Hypertension; Peripheral Venous Disease Negative for: Angina; Arrhythmia; Coronary Artery Disease; Deep Vein Thrombosis; Hypotension; Myocardial Infarction; Peripheral Arterial Disease; Phlebitis; Vasculitis Gastrointestinal Medical History: Negative for: Cirrhosis ; Colitis; Crohns; Hepatitis A; Hepatitis B; Hepatitis C Past Medical History Notes: GERD Endocrine Medical History: Positive for: Type II Diabetes Negative for: Type I Diabetes Past Medical History Notes: thyroid nodule Time with diabetes: 15 Treated with: Insulin Blood sugar tested every day: Yes Tested : 2-3 times per day EDITHA, BRIDGEFORTH ([id number]) [id number] Page 9 of 10 Medical History: Negative for: End Stage Renal Disease Past Medical History Notes: CKD stage 3 Immunological Medical History: Negative for: Lupus Erythematosus; Raynauds; Scleroderma Integumentary (Skin) Medical History: Negative for: History of Burn Musculoskeletal Medical History: Positive for: Osteoarthritis Past Medical History Notes: fibromyalgia Neurologic Medical History: Positive for: Neuropathy Negative for: Dementia; Quadriplegia; Paraplegia; Seizure Disorder Oncologic Medical History: Negative for: Received Chemotherapy; Received Radiation Past Medical History Notes: hx colon CA Psychiatric Medical History: Positive for: Confinement Anxiety Negative for: Anorexia/bulimia Immunizations Pneumococcal Vaccine: Received Pneumococcal Vaccination: No Implantable Devices Yes Hospitalization / Surgery History Type of Hospitalization/Surgery tibia nail insertion right cardiac cath lumbar laminectomy pacemaker implanted appendectomy lumpectomy left breast DandC uterus partial nephrectomy tosillectomy tubal ligation Family and Social History Cancer: No; Diabetes:  Yes - Siblings; Heart Disease: Yes - Mother; Hereditary Spherocytosis: No; Hypertension: Yes - Mother; Kidney Disease: Yes - Siblings; Lung Disease: No; Seizures: No; Stroke: No; Thyroid Problems: No; Tuberculosis: No; Never smoker; Marital Status - Widowed; Alcohol Use: Rarely; Drug Use: No History; Caffeine Use: Daily; Financial Concerns: No; Food, Clothing or Shelter Needs: No; Support System Lacking: No; Transportation Concerns: No Electronic Signature(s) Signed: 01/01/2023 12:19:49 PM By: Geralyn Corwin DO Entered By: Geralyn Corwin on 01/01/2023 12:17:44 SuperBill Details -------------------------------------------------------------------------------- [name] ([id number]) [id number] Page 10 of 10 Patient Name: Date of Service: CRYSTALMARIE, YASIN 01/01/2023 Medical Record Number: [id number] Patient Account Number: [id number] Date of Birth/Sex: Treating RN: [birth year] (82 y.o. F) Primary Care Provider: Aliene Beams Other Clinician: Referring Provider: Treating Provider/Extender: Marguerite Olea in Treatment: 1 Diagnosis Coding ICD-10 Codes Code Description [phone] Non-pressure chronic ulcer of other part of right lower leg with fat layer exposed A41.660  Non-pressure chronic ulcer of other part of left lower leg with fat layer exposed I87.311 Chronic venous hypertension (idiopathic) with ulcer of right lower extremity E11.622 Type 2 diabetes mellitus with other skin ulcer I89.0 Lymphedema, not elsewhere classified Facility Procedures CPT4 Description Modifier Quantity Code [id number] BILATERAL: Application of multi-layer venous compression system; leg (below knee), including ankle and 1 foot. Physician Procedures Quantity CPT4 Code Description Modifier [id number] - WC PHYS LEVEL 3 - EST PT 1 ICD-10 Diagnosis Description L97.812 Non-pressure chronic ulcer of other part of right lower leg with fat layer  exposed L97.822 Non-pressure chronic ulcer of other part of left lower leg with fat layer exposed I87.311 Chronic venous hypertension (idiopathic) with ulcer of right lower extremity E11.622 Type 2 diabetes mellitus with other skin ulcer Electronic Signature(s) Signed: 01/01/2023 3:44:57 PM By: Geralyn Corwin DO Signed: 01/03/2023 3:29:20 PM By: Redmond Pulling RN, BSN Previous Signature: 01/01/2023 12:19:49 PM Version By: Geralyn Corwin DO Entered By: Redmond Pulling on 01/01/2023 12:35:59

## 2023-01-02 ENCOUNTER — Encounter (HOSPITAL_BASED_OUTPATIENT_CLINIC_OR_DEPARTMENT_OTHER): Payer: Medicare Other | Admitting: General Surgery

## 2023-01-03 ENCOUNTER — Encounter (HOSPITAL_BASED_OUTPATIENT_CLINIC_OR_DEPARTMENT_OTHER): Payer: Medicare Other | Admitting: Internal Medicine

## 2023-01-04 NOTE — Progress Notes (Signed)
[name], [name] ([id number]) [id number] Page 1 of 9 Visit Report for 01/01/2023 Arrival Information Details Patient Name: Date of Service: [name] 01/01/2023 11:15 A M Medical Record Number: [id number] Patient Account Number: [id number] Date of Birth/Sex: Treating RN: 01/16/1941 (82 y.o. [name], Millard.Loa Primary Care Ludene Stokke: Aliene Beams Other Clinician: Referring Rontavious Albright: Treating Bricelyn Freestone/Extender: Marguerite Olea in Treatment: 1 Visit Information History Since Last Visit Added or deleted any medications: No Patient Arrived: [name] Any new allergies or adverse reactions: No Arrival Time: 11:16 Had a fall or experienced change in No Accompanied By: self activities of daily living that may affect Transfer Assistance: None risk of falls: Patient Identification Verified: Yes Signs or symptoms of abuse/neglect since last visito No Secondary Verification Process Completed: Yes Hospitalized since last visit: No Patient Requires Transmission-Based Precautions: No Implantable device outside of the clinic excluding No Patient Has Alerts: No cellular tissue based products placed in the center since last visit: Has Dressing in Place as Prescribed: Yes Has Compression in Place as Prescribed: Yes Pain Present Now: No Electronic Signature(s) Signed: 01/01/2023 5:19:30 PM By: Shawn Stall RN, BSN Entered By: Shawn Stall on 01/01/2023 11:16:34 -------------------------------------------------------------------------------- Compression Therapy Details Patient Name: Date of Service: [name] 01/01/2023 11:15 A M Medical Record Number: [id number] Patient Account Number: [id number] Date of Birth/Sex: Treating RN: March 18, 1941 (82 y.o. Orville Govern Primary Care Neidra Girvan: Aliene Beams Other Clinician: Referring Anel Creighton: Treating Lindora Alviar/Extender: Marguerite Olea in Treatment: 1 Compression Therapy  Performed for Wound Assessment: Wound #6 Left,Dorsal Foot Performed By: Clinician Redmond Pulling, RN Compression Type: Three Layer Post Procedure Diagnosis Same as Pre-procedure Electronic Signature(s) Signed: 01/03/2023 3:29:20 PM By: Redmond Pulling RN, BSN Entered By: Redmond Pulling on 01/01/2023 12:35:08 -------------------------------------------------------------------------------- Compression Therapy Details Patient Name: Date of Service: [name] 01/01/2023 11:15 A M Medical Record Number: [id number] Patient Account Number: [id number] Date of Birth/Sex: Treating RN: 03-02-1941 (82 y.o. Orville Govern Primary Care Jennett Tarbell: Aliene Beams Other Clinician: Referring Yvon Mccord: Treating Zidan Helget/Extender: Marguerite Olea in Treatment: 1 Compression Therapy Performed for Wound Assessment: Wound #5 Right,Anterior Lower Leg Performed By: Clinician Redmond Pulling, RN Compression Type: Three Layer Post Procedure Diagnosis Same as Pre-procedure [name] ([id number]) [id number] Page 2 of 9 Electronic Signature(s) Signed: 01/03/2023 3:29:20 PM By: Redmond Pulling RN, BSN Entered By: Redmond Pulling on 01/01/2023 12:35:08 -------------------------------------------------------------------------------- Encounter Discharge Information Details Patient Name: Date of Service: [name] 01/01/2023 11:15 A M Medical Record Number: [id number] Patient Account Number: [id number] Date of Birth/Sex: Treating RN: 1940/11/22 (82 y.o. Orville Govern Primary Care Nicki Gracy: Aliene Beams Other Clinician: Referring Camaria Gerald: Treating Messi Twedt/Extender: Marguerite Olea in Treatment: 1 Encounter Discharge Information Items Discharge Condition: Stable Ambulatory Status: Ambulatory Discharge Destination: Home Transportation: Private Auto Accompanied By: self Schedule Follow-up Appointment: Yes Clinical Summary  of Care: Patient Declined Electronic Signature(s) Signed: 01/03/2023 3:29:20 PM By: Redmond Pulling RN, BSN Entered By: Redmond Pulling on 01/01/2023 13:47:46 -------------------------------------------------------------------------------- Lower Extremity Assessment Details Patient Name: Date of Service: [name] 01/01/2023 11:15 A M Medical Record Number: [id number] Patient Account Number: [id number] Date of Birth/Sex: Treating RN: 1940/12/29 (82 y.o. Arta Silence Primary Care Bianca Raneri: Aliene Beams Other Clinician: Referring Allia Wiltsey: Treating Kemal Amores/Extender: Marguerite Olea in Treatment: 1 Edema Assessment Assessed: [Left: Yes] [Right: Yes] Edema: [Left: Yes] [Right: Yes] Calf Left: Right: Point of Measurement: 28 cm From Medial Instep 39 cm 40 cm Ankle Left: Right: Point of Measurement: 11 cm  From Medial Instep 27 cm 27.5 cm Vascular Assessment Pulses: Dorsalis Pedis Palpable: [Left:Yes] [Right:Yes] Electronic Signature(s) Signed: 01/01/2023 5:19:30 PM By: Shawn Stall RN, BSN Entered By: Shawn Stall on 01/01/2023 11:21:40 Multi Wound Chart Details -------------------------------------------------------------------------------- [name] ([id number]) [id number] Page 3 of 9 Patient Name: Date of Service: [name] 01/01/2023 11:15 A M Medical Record Number: [id number] Patient Account Number: [id number] Date of Birth/Sex: Treating RN: September 24, 1940 (82 y.o. F) Primary Care Jaelin Devincentis: Aliene Beams Other Clinician: Referring Carletha Dawn: Treating Willodene Stallings/Extender: Marguerite Olea in Treatment: 1 Vital Signs Height(in): Pulse(bpm): 85 Weight(lbs): Blood Pressure(mmHg): 167/69 Body Mass Index(BMI): Temperature(F): 98.6 Respiratory Rate(breaths/min): 20 Wound Assessments Wound Number: 5 6 N/A Photos: N/A Right, Anterior Lower Leg Left, Dorsal Foot N/A Wound  Location: Gradually Appeared Blister N/A Wounding Event: Lymphedema Diabetic Wound/Ulcer of the Lower N/A Primary Etiology: Extremity Lymphedema, Congestive Heart Lymphedema, Congestive Heart N/A Comorbid History: Failure, Hypertension, Peripheral Failure, Hypertension, Peripheral Venous Disease, Type II Diabetes, Venous Disease, Type II Diabetes, Osteoarthritis, Neuropathy, Osteoarthritis, Neuropathy, Confinement Anxiety Confinement Anxiety 11/22/2022 12/10/2022 N/A Date Acquired: 1 1 N/A Weeks of Treatment: Open Open N/A Wound Status: No No N/A Wound Recurrence: 1.4x1x0.1 0x0x0 N/A Measurements L x W x D (cm) 1.1 0 N/A A (cm) : rea 0.11 0 N/A Volume (cm) : -250.30% 100.00% N/A % Reduction in Area: -74.60% 100.00% N/A % Reduction in Volume: Full Thickness Without Exposed Grade 1 N/A Classification: Support Structures Medium None Present N/A Exudate A mount: Serosanguineous N/A N/A Exudate Type: red, brown N/A N/A Exudate Color: Distinct, outline attached Distinct, outline attached N/A Wound Margin: Large (67-100%) None Present (0%) N/A Granulation Amount: Red, Pink N/A N/A Granulation Quality: Small (1-33%) Large (67-100%) N/A Necrotic Amount: Adherent Slough Eschar N/A Necrotic Tissue: Fat Layer (Subcutaneous Tissue): Yes Fat Layer (Subcutaneous Tissue): Yes N/A Exposed Structures: Fascia: No Fascia: No Tendon: No Tendon: No Muscle: No Muscle: No Joint: No Joint: No Bone: No Bone: No Small (1-33%) Large (67-100%) N/A Epithelialization: Excoriation: No Excoriation: No N/A Periwound Skin Texture: Induration: No Induration: No Callus: No Callus: No Crepitus: No Crepitus: No Rash: No Rash: No Scarring: No Scarring: No Maceration: No Maceration: No N/A Periwound Skin Moisture: Dry/Scaly: No Dry/Scaly: No Hemosiderin Staining: Yes Atrophie Blanche: No N/A Periwound Skin Color: Atrophie Blanche: No Cyanosis: No Cyanosis: No Ecchymosis:  No Ecchymosis: No Erythema: No Erythema: No Hemosiderin Staining: No Mottled: No Mottled: No Pallor: No Pallor: No Rubor: No Rubor: No Treatment Notes Electronic Signature(sGUADALUPE, KEREKES ([id number]) [id number] Page 4 of 9 Signed: 01/01/2023 12:19:49 PM By: Geralyn Corwin DO Entered By: Geralyn Corwin on 01/01/2023 12:17:03 -------------------------------------------------------------------------------- Multi-Disciplinary Care Plan Details Patient Name: Date of Service: ASENATH, BALASH 01/01/2023 11:15 A M Medical Record Number: [id number] Patient Account Number: [id number] Date of Birth/Sex: Treating RN: Oct 10, 1940 (82 y.o. Orville Govern Primary Care Edis Huish: Aliene Beams Other Clinician: Referring Jabaree Mercado: Treating Suprena Travaglini/Extender: Marguerite Olea in Treatment: 1 Active Inactive Venous Leg Ulcer Nursing Diagnoses: Knowledge deficit related to disease process and management Potential for venous Insuffiency (use before diagnosis confirmed) Goals: Patient will maintain optimal edema control Date Initiated: 12/25/2022 Target Resolution Date: 01/30/2023 Goal Status: Active Patient/caregiver will verbalize understanding of disease process and disease management Date Initiated: 12/25/2022 Target Resolution Date: 01/24/2023 Goal Status: Active Interventions: Assess peripheral edema status every visit. Compression as ordered Provide education on venous insufficiency Notes: Wound/Skin Impairment Nursing Diagnoses: Impaired tissue integrity Knowledge deficit related to ulceration/compromised skin integrity Goals: Patient/caregiver will verbalize understanding of  skin care regimen Date Initiated: 12/25/2022 Target Resolution Date: 02/07/2023 Goal Status: Active Ulcer/skin breakdown will have a volume reduction of 30% by week 4 Date Initiated: 12/25/2022 Target Resolution Date: 01/22/2023 Goal Status:  Active Interventions: Assess patient/caregiver ability to obtain necessary supplies Assess patient/caregiver ability to perform ulcer/skin care regimen upon admission and as needed Assess ulceration(s) every visit Provide education on ulcer and skin care Notes: Electronic Signature(s) Signed: 01/03/2023 3:29:20 PM By: Redmond Pulling RN, BSN Entered By: Redmond Pulling on 01/01/2023 11:28:33 -------------------------------------------------------------------------------- Pain Assessment Details Patient Name: Date of Service: BRADEN, DELOACH 01/01/2023 11:15 A M Medical Record Number: [id number] Patient Account Number: [id number] Date of Birth/Sex: Treating RN: 10/19/1940 (82 y.o. Arta Silence Primary Care Antasia Haider: Aliene Beams Other Clinician: [name] ([id number]) [id number] Page 5 of 9 Referring Takeshi Teasdale: Treating Victory Strollo/Extender: Marguerite Olea in Treatment: 1 Active Problems Location of Pain Severity and Description of Pain Patient Has Paino No Site Locations Pain Management and Medication Current Pain Management: Electronic Signature(s) Signed: 01/01/2023 5:19:30 PM By: Shawn Stall RN, BSN Entered By: Shawn Stall on 01/01/2023 11:16:40 -------------------------------------------------------------------------------- Patient/Caregiver Education Details Patient Name: Date of Service: Charline Bills 4/23/2024andnbsp11:15 A M Medical Record Number: [id number] Patient Account Number: [id number] Date of Birth/Gender: Treating RN: August 31, 1941 (82 y.o. Orville Govern Primary Care Physician: Aliene Beams Other Clinician: Referring Physician: Treating Physician/Extender: Marguerite Olea in Treatment: 1 Education Assessment Education Provided To: Patient Education Topics Provided Wound/Skin Impairment: Methods: Explain/Verbal Responses: State content correctly Electronic  Signature(s) Signed: 01/03/2023 3:29:20 PM By: Redmond Pulling RN, BSN Entered By: Redmond Pulling on 01/01/2023 11:28:47 -------------------------------------------------------------------------------- Wound Assessment Details Patient Name: Date of Service: [name], [name] 01/01/2023 11:15 A M Medical Record Number: [id number] Patient Account Number: [id number] Date of Birth/Sex: Treating RN: 1941-07-27 (82 y.o. Arta Silence Primary Care Bunny Kleist: Aliene Beams Other Clinician: Referring Mercedes Fort: Treating Pressley Barsky/Extender: Marguerite Olea in Treatment: 1 Joice, Bosie Clos ([id number]) [id number] Page 6 of 9 Wound Status Wound Number: 5 Primary Lymphedema Etiology: Wound Location: Right, Anterior Lower Leg Wound Open Wounding Event: Gradually Appeared Status: Date Acquired: 11/22/2022 Comorbid Lymphedema, Congestive Heart Failure, Hypertension, Peripheral Weeks Of Treatment: 1 History: Venous Disease, Type II Diabetes, Osteoarthritis, Neuropathy, Clustered Wound: No Confinement Anxiety Photos Wound Measurements Length: (cm) Width: (cm) Depth: (cm) Area: (cm) Volume: (cm) 1.4 % Reduction in Area: -250.3% 1 % Reduction in Volume: -74.6% 0.1 Epithelialization: Small (1-33%) 1.1 Tunneling: No 0.11 Undermining: No Wound Description Classification: Full Thickness Without Exposed Support Structures Wound Margin: Distinct, outline attached Exudate Amount: Medium Exudate Type: Serosanguineous Exudate Color: red, brown Foul Odor After Cleansing: No Slough/Fibrino Yes Wound Bed Granulation Amount: Large (67-100%) Exposed Structure Granulation Quality: Red, Pink Fascia Exposed: No Necrotic Amount: Small (1-33%) Fat Layer (Subcutaneous Tissue) Exposed: Yes Necrotic Quality: Adherent Slough Tendon Exposed: No Muscle Exposed: No Joint Exposed: No Bone Exposed: No Periwound Skin Texture Texture Color No Abnormalities Noted:  No No Abnormalities Noted: No Callus: No Atrophie Blanche: No Crepitus: No Cyanosis: No Excoriation: No Ecchymosis: No Induration: No Erythema: No Rash: No Hemosiderin Staining: Yes Scarring: No Mottled: No Pallor: No Moisture Rubor: No No Abnormalities Noted: No Dry / Scaly: No Maceration: No Treatment Notes Wound #5 (Lower Leg) Wound Laterality: Right, Anterior Cleanser Soap and Water Discharge Instruction: May shower and wash wound with dial antibacterial soap and water prior to dressing change. Vashe 5.8 (oz) Discharge Instruction: Cleanse the wound with Vashe prior to applying a clean dressing using gauze  sponges, not tissue or cotton balls. Peri-Wound Care Sween Lotion (Moisturizing lotion) [place], Alyzah ([id number]) [id number] Page 7 of 9 Discharge Instruction: Apply moisturizing lotion as directed Topical Primary Dressing Maxorb Extra Ag+ Alginate Dressing, 4x4.75 (in/in) Discharge Instruction: Apply to wound bed as instructed Secondary Dressing ABD Pad, 5x9 Discharge Instruction: Apply over primary dressing as directed. Secured With [employer] Discharge Instruction: to top of wrap to keep from sliding down Compression Wrap Urgo K2 Lite, two layer compression system, regular Discharge Instruction: Apply Urgo K2 Lite as directed (alternative to 3 layer compression). Compression Stockings Add-Ons Electronic Signature(s) Signed: 01/01/2023 5:19:30 PM By: Shawn Stall RN, BSN Entered By: Shawn Stall on 01/01/2023 11:25:32 -------------------------------------------------------------------------------- Wound Assessment Details Patient Name: Date of Service: ALEXIANNA, NACHREINER 01/01/2023 11:15 A M Medical Record Number: [id number] Patient Account Number: [id number] Date of Birth/Sex: Treating RN: 1941/07/19 (82 y.o. [name], Millard.Loa Primary Care Moses Odoherty: Aliene Beams Other Clinician: Referring Jemmie Rhinehart: Treating Deatra Mcmahen/Extender:  Marguerite Olea in Treatment: 1 Wound Status Wound Number: 6 Primary Diabetic Wound/Ulcer of the Lower Extremity Etiology: Wound Location: Left, Dorsal Foot Wound Open Wounding Event: Blister Status: Date Acquired: 12/10/2022 Comorbid Lymphedema, Congestive Heart Failure, Hypertension, Peripheral Weeks Of Treatment: 1 History: Venous Disease, Type II Diabetes, Osteoarthritis, Neuropathy, Clustered Wound: No Confinement Anxiety Photos Wound Measurements Length: (cm) Width: (cm) Depth: (cm) Area: (cm) Volume: (cm) 0 % Reduction in Area: 100% 0 % Reduction in Volume: 100% 0 Epithelialization: Large (67-100%) 0 Tunneling: No 0 Undermining: No Wound Description Classification: Grade 1 Wound Margin: Distinct, outline attached Exudate Amount: None Present Ohalloran, Susette ([id number]) Wound Bed Granulation Amount: None Present (0%) Necrotic Amount: Large (67-100%) Necrotic Quality: Eschar Foul Odor After Cleansing: No Slough/Fibrino No [id number] Page 8 of 9 Exposed Structure Fascia Exposed: No Fat Layer (Subcutaneous Tissue) Exposed: Yes Tendon Exposed: No Muscle Exposed: No Joint Exposed: No Bone Exposed: No Periwound Skin Texture Texture Color No Abnormalities Noted: No No Abnormalities Noted: No Callus: No Atrophie Blanche: No Crepitus: No Cyanosis: No Excoriation: No Ecchymosis: No Induration: No Erythema: No Rash: No Hemosiderin Staining: No Scarring: No Mottled: No Pallor: No Moisture Rubor: No No Abnormalities Noted: No Dry / Scaly: No Maceration: No Treatment Notes Wound #6 (Foot) Wound Laterality: Dorsal, Left Cleanser Soap and Water Discharge Instruction: May shower and wash wound with dial antibacterial soap and water prior to dressing change. Vashe 5.8 (oz) Discharge Instruction: Cleanse the wound with Vashe prior to applying a clean dressing using gauze sponges, not tissue or cotton  balls. Peri-Wound Care Sween Lotion (Moisturizing lotion) Discharge Instruction: Apply moisturizing lotion as directed Topical Primary Dressing Maxorb Extra Ag+ Alginate Dressing, 4x4.75 (in/in) Discharge Instruction: Apply to wound bed as instructed Secondary Dressing ABD Pad, 5x9 Discharge Instruction: Apply over primary dressing as directed. Secured With [employer] Discharge Instruction: to top of wrap to keep from sliding down Compression Wrap Urgo K2 Lite, two layer compression system, regular Discharge Instruction: Apply Urgo K2 Lite as directed (alternative to 3 layer compression). Compression Stockings Add-Ons Electronic Signature(s) Signed: 01/01/2023 5:19:30 PM By: Shawn Stall RN, BSN Entered By: Shawn Stall on 01/01/2023 11:26:23 -------------------------------------------------------------------------------- Vitals Details Patient Name: Date of Service: REEMA, CHICK 01/01/2023 11:15 A M Medical Record Number: [id number] Patient Account Number: [id number] Date of Birth/Sex: Treating RN: 1940/12/03 (82 y.o. Arta Silence Primary Care Tatum Corl: Aliene Beams Other Clinician: Referring Iseah Plouff: Treating Saranya Harlin/Extender: Marguerite Olea in Treatment: 1 [place], Bosie Clos ([id number]) [id number] Page 9 of 9 Vital Signs Time Taken: 11:17  Temperature (F): 98.6 Pulse (bpm): 85 Respiratory Rate (breaths/min): 20 Blood Pressure (mmHg): 167/69 Reference Range: 80 - 120 mg / dl Electronic Signature(s) Signed: 01/01/2023 5:19:30 PM By: Shawn Stall RN, BSN Entered By: Shawn Stall on 01/01/2023 11:18:16

## 2023-01-08 ENCOUNTER — Encounter (HOSPITAL_BASED_OUTPATIENT_CLINIC_OR_DEPARTMENT_OTHER): Payer: Medicare Other | Admitting: Internal Medicine

## 2023-01-08 DIAGNOSIS — I87311 Chronic venous hypertension (idiopathic) with ulcer of right lower extremity: Secondary | ICD-10-CM

## 2023-01-08 DIAGNOSIS — L97822 Non-pressure chronic ulcer of other part of left lower leg with fat layer exposed: Secondary | ICD-10-CM | POA: Diagnosis not present

## 2023-01-08 DIAGNOSIS — Z8249 Family history of ischemic heart disease and other diseases of the circulatory system: Secondary | ICD-10-CM | POA: Diagnosis not present

## 2023-01-08 DIAGNOSIS — E1151 Type 2 diabetes mellitus with diabetic peripheral angiopathy without gangrene: Secondary | ICD-10-CM | POA: Diagnosis not present

## 2023-01-08 DIAGNOSIS — L97812 Non-pressure chronic ulcer of other part of right lower leg with fat layer exposed: Secondary | ICD-10-CM | POA: Diagnosis not present

## 2023-01-08 DIAGNOSIS — I130 Hypertensive heart and chronic kidney disease with heart failure and stage 1 through stage 4 chronic kidney disease, or unspecified chronic kidney disease: Secondary | ICD-10-CM | POA: Diagnosis not present

## 2023-01-08 DIAGNOSIS — E1142 Type 2 diabetes mellitus with diabetic polyneuropathy: Secondary | ICD-10-CM | POA: Diagnosis not present

## 2023-01-08 DIAGNOSIS — Z950 Presence of cardiac pacemaker: Secondary | ICD-10-CM | POA: Diagnosis not present

## 2023-01-08 DIAGNOSIS — E11622 Type 2 diabetes mellitus with other skin ulcer: Secondary | ICD-10-CM | POA: Diagnosis not present

## 2023-01-08 DIAGNOSIS — Z833 Family history of diabetes mellitus: Secondary | ICD-10-CM | POA: Diagnosis not present

## 2023-01-08 DIAGNOSIS — I890 Lymphedema, not elsewhere classified: Secondary | ICD-10-CM | POA: Diagnosis not present

## 2023-01-08 DIAGNOSIS — Z85038 Personal history of other malignant neoplasm of large intestine: Secondary | ICD-10-CM | POA: Diagnosis not present

## 2023-01-08 DIAGNOSIS — N184 Chronic kidney disease, stage 4 (severe): Secondary | ICD-10-CM | POA: Diagnosis not present

## 2023-01-08 DIAGNOSIS — I5032 Chronic diastolic (congestive) heart failure: Secondary | ICD-10-CM | POA: Diagnosis not present

## 2023-01-08 DIAGNOSIS — E1122 Type 2 diabetes mellitus with diabetic chronic kidney disease: Secondary | ICD-10-CM | POA: Diagnosis not present

## 2023-01-09 NOTE — Progress Notes (Signed)
MILLI, WOOLRIDGE ([id number]) [id number] Page 1 of 11 Visit Report for 01/08/2023 Chief Complaint Document Details Patient Name: Date of Service: [name], [name] 01/08/2023 2:00 PM Medical Record Number: [id number] Patient Account Number: [id number] Date of Birth/Sex: Treating RN: [birth year] (82 y.o. F) Primary Care Provider: Aliene Beams Other Clinician: Referring Provider: Treating Provider/Extender: Marguerite Olea in Treatment: 2 Information Obtained from: Patient Chief Complaint 03/28/2020; patient is here for review of wounds on her bilateral lower legs 12/25/2022; bilateral lower extremity wound Electronic Signature(s) Signed: 01/08/2023 2:51:29 PM By: Geralyn Corwin DO Entered By: Geralyn Corwin on 01/08/2023 14:44:01 -------------------------------------------------------------------------------- HPI Details Patient Name: Date of Service: [name], [name] 01/08/2023 2:00 PM Medical Record Number: [id number] Patient Account Number: [id number] Date of Birth/Sex: Treating RN: [birth year] (82 y.o. F) Primary Care Provider: Aliene Beams Other Clinician: Referring Provider: Treating Provider/Extender: Marguerite Olea in Treatment: 2 History of Present Illness HPI Description: ADMISSION 03/28/2020 This is a 82 year old woman who is accompanied by her daughter. She is moving to [place] from [place] where she lives. Infectious been going to the wound care center in [place] for the last 2 months. She apparently did not tolerate compression early on in that clinic stay. She is just using dry gauze over the wounds when she came in today. She tells me she has had both arterial and venous reflux studies although she does not have copies of these. She was apparently offered an ablation but I have no further information on that. She also is a type II diabetic. She was noted to have wounds on her  lower extremities during a CHF clinic visit with Dr. Gala Romney and she was referred here. She has several scattered areas on the right medial lower leg and a large area of superficial ulceration on the posterior medial left lower leg. past medical history; chronic kidney disease stage III, gastroesophageal reflux disease, type 2 diabetes with neuropathy, chronic diastolic heart failure, obstructive sleep apnea, hypertension, restless leg syndrome, pulmonary venous hypertension, history of colon CA, pacemaker and apparently venous reflux. We did not do arterial studies in the clinic today we are going to try to get the results that were already done within the last 2 months in [place] 7/26; patient readmitted to the clinic last week with predominantly chronic venous wounds almost circumferentially in the left lower leg and the right leg medially. She also has secondary lymphedema. We put her in compression. She went to the ER in [place] on Friday they remove the wrap on the left leg diagnosed her with cellulitis and put her on doxycycline. They apparently also did a ultrasound that was negative for DVT . We did not get any vascular information from [facility] where she apparently had arterial studies and venous studies. She is currently moving from [place] to [place] is up on her feet quite a bit. She was referred to vein and vascular here but never got to the appointment. ABIs were obtained here at 0.89 on the right and 1.02 on the left 8/9-Patient returns after establishing in clinic last visit, we have been doing 3 layer compression on both sides with calcium alginate, she is following up with the vein and vascular when she gets an appointment. The right side is healed the left side is still got some small open areas She has appt on Wed at Vein and vascular at which point she will come in for Nurse visit here 8/16; the patient arrives with all of her wounds closed.  She has bilateral  Juzo stockings. She saw Dr. Darrick Penna on 8/11 to review her reflux studies from the same day. On the right she had no evidence of DVT or SVT She did have deep venous reflux in the common femoral vein as well as superficial venous reflux in the . small saphenous vein of the mid calf on the left again no thrombus in the deep vein or superficial veins. Deep vein reflux in the common femoral vein as well as the great saphenous vein. She saw Dr. Darrick Penna in consult. Also of note he noted that she had a normal arterial scan done in [place] in April 2021, we were never able to obtain this. His feeling was that the patient did have a dilated greater saphenous vein however minimal to no evidence of reflux in her superficial venous system. She also was felt to have mild deep vein reflux and some degree of lymphedema. He recommended continued compression of the lower extremities with intermittent Unna boots if necessary. She was told to elevate her legs. If she develops recurrent ulcerations they were not opposed to revisiting the repeat venous reflux exam to see if she develops worsening reflux for consideration of laser ablation READMISSION 07/26/2020 [name], [name] ([id number]) [id number] Page 2 of 11 Patient is now an 82 year old woman who lives in [place]. We had her in the clinic in the summer with wounds on her bilateral lower legs secondary to chronic venous insufficiency with some degree of lymphedema. She was seen by Dr. Darrick Penna in August. This was predominantly to review her venous reflux. She quoted normal arterial studies done in [place] in April 2021 but I have never been able to see these myself.Marland Kitchen His feeling was the patient did have a bilateral great dilated greater saphenous vein however minimal to no evidence of reflux in her superficial venous system. She was also felt to have mild deep vein reflux and some degree of lymphedema. He recommended compression and leg  elevation.Marland Kitchen He was not opposed to repeating her venous reflux studies at some point if she develops worse to see if she develops worsening reflux for consideration of ablation. We discharged the patient with juxta lite stockings bilaterally and she has been compliant with wearing these. Unfortunately in September she had a fall suffering a right tibial fracture requiring an IM nail with a rod. She also had fibular fractures in at least one place. She was sent to skilled facility for rehabilitation she is now back at home. She had a DVT rule out study on 9/24 that was negative for DVT . She tells [place] that in the last 2 or 3 weeks she is developed blisters on her right lower extremity that ruptured into 2 open wounds. She has not been able to get these to close over. She religiously uses her juxta lite stockings on both legs and she gets them on is tight as she can. Originally the left leg was the larger of the 2 legs but since the patient had surgery the right leg is more swollen and tense than the left. She also tells me she had a second fall yesterday and has had pain in the left dorsal midfoot. She has diabetic neuropathy with Charcot foot left greater than right. Past medical history includes chronic kidney disease, hypertension, type 2 diabetes with peripheral neuropathy, congestive heart failure, hypertension, history of colon CA, lumbar stenosis ABIs in our clinic were noncompressible bilaterally 11/30; the wounds in the right lower extremity are healed anteriorly. She has  both Tubigrip and her juxta lite stockings she uses the Tubigrip as the primary layer. We went over this with her today she has the bilateral stockings. She will need to adjust her compression to roughly 30 mmHg 12/25/2022 Mrs. [name] is a 82 year old female with a past medical history Of CKD stage IV, lymphedema/venous insufficiency, controlled type 2 diabetes that presents the clinic for a 34-month history of  bilateral lower extremity wounds. She has difficulty controlling the edema and is currently on metolazone 2.5 mg once weekly and torsemide 40 mg daily. She sleeps in a recliner. She has been using antibiotic ointment to the wound beds. She states she has Juxta lite compressions which she uses daily. She currently denies signs of infection. 4/23; patient presents for follow-up. We have been using Santyl and Hydrofera Blue under Kerlix/Coban to the lower extremities bilaterally. She tolerated this well. 4/30; patient presents for follow-up. We have been using silver alginate under 3 layer compression. Patient did not tolerate the wraps and took these off last week. She has significant swelling on exam. She does report that her cardiologist recommended increasing the diuretic regimen at one point to help with her lower extremity edema however she did not do this. Electronic Signature(s) Signed: 01/08/2023 2:51:29 PM By: Geralyn Corwin DO Entered By: Geralyn Corwin on 01/08/2023 14:45:51 -------------------------------------------------------------------------------- Physical Exam Details Patient Name: Date of Service: ADISSON, DEAK 01/08/2023 2:00 PM Medical Record Number: [id number] Patient Account Number: [id number] Date of Birth/Sex: Treating RN: 1941/03/19 (82 y.o. F) Primary Care Provider: Aliene Beams Other Clinician: Referring Provider: Treating Provider/Extender: Marguerite Olea in Treatment: 2 Constitutional respirations regular, non-labored and within target range for patient.. Cardiovascular 2+ dorsalis pedis/posterior tibialis pulses. Psychiatric pleasant and cooperative. Notes Scattered open areas to the lower extremities bilaterally a limited skin breakdown. Significant hemosiderin staining noted noted throughout the legs. 3+ pitting edema to the knees bilaterally. No signs of surrounding infection including increased warmth, erythema or purulent  drainage. Electronic Signature(s) Signed: 01/08/2023 2:51:29 PM By: Geralyn Corwin DO Entered By: Geralyn Corwin on 01/08/2023 14:46:33 -------------------------------------------------------------------------------- Physician Orders Details Patient Name: Date of Service: PAILYNN, VAHEY 01/08/2023 2:00 PM Medical Record Number: [id number] Patient Account Number: [id number] Date of Birth/Sex: Treating RN: [birth year] (82 y.o. Arta Silence Primary Care Provider: Aliene Beams Other Clinician: [name] ([id number]) [id number] Page 3 of 11 Referring Provider: Treating Provider/Extender: Marguerite Olea in Treatment: 2 Verbal / Phone Orders: No Diagnosis Coding ICD-10 Coding Code Description [id number] Non-pressure chronic ulcer of other part of right lower leg with fat layer exposed L97.822 Non-pressure chronic ulcer of other part of left lower leg with fat layer exposed I87.311 Chronic venous hypertension (idiopathic) with ulcer of right lower extremity E11.622 Type 2 diabetes mellitus with other skin ulcer I89.0 Lymphedema, not elsewhere classified Follow-up Appointments ppointment in 1 week. - Dr Mikey Bussing - Room 9 01/14/23 @ 330pm Return A Other: - Speak with Cardiologist about fluid overfload Anesthetic Wound #5 Right,Anterior Lower Leg (In clinic) Topical Lidocaine 4% applied to wound bed Wound #6 Left,Dorsal Foot (In clinic) Topical Lidocaine 4% applied to wound bed Bathing/ Shower/ Hygiene May shower with protection but do not get wound dressing(s) wet. Protect dressing(s) with water repellant cover (for example, large plastic bag) or a cast cover and may then take shower. Edema Control - Lymphedema / SCD / Other Bilateral Lower Extremities Elevate legs to the level of the heart or above for 30 minutes daily and/or when sitting for  3-4 times a day throughout the day. Avoid standing for long periods of time. Wound  Treatment Wound #5 - Lower Leg Wound Laterality: Right, Anterior Cleanser: Soap and Water 1 x Per Week/30 Days Discharge Instructions: May shower and wash wound with dial antibacterial soap and water prior to dressing change. Cleanser: Vashe 5.8 (oz) 1 x Per Week/30 Days Discharge Instructions: Cleanse the wound with Vashe prior to applying a clean dressing using gauze sponges, not tissue or cotton balls. Peri-Wound Care: Sween Lotion (Moisturizing lotion) 1 x Per Week/30 Days Discharge Instructions: Apply moisturizing lotion as directed Prim Dressing: Maxorb Extra Ag+ Alginate Dressing, 4x4.75 (in/in) 1 x Per Week/30 Days ary Discharge Instructions: Apply to wound bed as instructed Secondary Dressing: ABD Pad, 5x9 1 x Per Week/30 Days Discharge Instructions: Apply over primary dressing as directed. Compression Wrap: CoFlex Calamine Unna Boot 4 x 6 (in/yd) 1 x Per Week/30 Days Discharge Instructions: Apply Coflex Calamine [employer] as directed. Wound #6 - Foot Wound Laterality: Dorsal, Left Cleanser: Soap and Water 1 x Per Week/30 Days Discharge Instructions: May shower and wash wound with dial antibacterial soap and water prior to dressing change. Cleanser: Vashe 5.8 (oz) 1 x Per Week/30 Days Discharge Instructions: Cleanse the wound with Vashe prior to applying a clean dressing using gauze sponges, not tissue or cotton balls. Peri-Wound Care: Sween Lotion (Moisturizing lotion) 1 x Per Week/30 Days Discharge Instructions: Apply moisturizing lotion as directed Prim Dressing: Maxorb Extra Ag+ Alginate Dressing, 4x4.75 (in/in) 1 x Per Week/30 Days ary Discharge Instructions: Apply to wound bed as instructed Secondary Dressing: ABD Pad, 5x9 1 x Per Week/30 Days Discharge Instructions: Apply over primary dressing as directed. Compression Wrap: CoFlex Calamine Unna Boot 4 x 6 (in/yd) 1 x Per Week/30 Days Discharge Instructions: Apply Coflex Calamine [employer] as directed. Wound #7 - Ankle  Wound Laterality: Right, Medial Cleanser: Soap and Water 1 x Per Week/30 Days SLOKA, VOLANTE ([id number]) [id number] Page 4 of 11 Discharge Instructions: May shower and wash wound with dial antibacterial soap and water prior to dressing change. Cleanser: Vashe 5.8 (oz) 1 x Per Week/30 Days Discharge Instructions: Cleanse the wound with Vashe prior to applying a clean dressing using gauze sponges, not tissue or cotton balls. Peri-Wound Care: Sween Lotion (Moisturizing lotion) 1 x Per Week/30 Days Discharge Instructions: Apply moisturizing lotion as directed Prim Dressing: Maxorb Extra Ag+ Alginate Dressing, 4x4.75 (in/in) 1 x Per Week/30 Days ary Discharge Instructions: Apply to wound bed as instructed Secondary Dressing: ABD Pad, 5x9 1 x Per Week/30 Days Discharge Instructions: Apply over primary dressing as directed. Compression Wrap: CoFlex Calamine Unna Boot 4 x 6 (in/yd) 1 x Per Week/30 Days Discharge Instructions: Apply Coflex Calamine [employer] as directed. Electronic Signature(s) Signed: 01/08/2023 2:51:29 PM By: Geralyn Corwin DO Entered By: Geralyn Corwin on 01/08/2023 14:46:43 -------------------------------------------------------------------------------- Problem List Details Patient Name: Date of Service: GINEVRA, TACKER 01/08/2023 2:00 PM Medical Record Number: [id number] Patient Account Number: [id number] Date of Birth/Sex: Treating RN: 07/23/1941 (82 y.o. [name], Millard.Loa Primary Care Provider: Aliene Beams Other Clinician: Referring Provider: Treating Provider/Extender: Marguerite Olea in Treatment: 2 Active Problems ICD-10 Encounter Code Description Active Date MDM Diagnosis [phone] Non-pressure chronic ulcer of other part of right lower leg with fat layer 12/25/2022 No Yes exposed L97.822 Non-pressure chronic ulcer of other part of left lower leg with fat layer exposed4/16/2024 No Yes I87.311 Chronic venous  hypertension (idiopathic) with ulcer of right lower extremity 12/25/2022 No Yes E11.622 Type  2 diabetes mellitus with other skin ulcer 12/25/2022 No Yes I89.0 Lymphedema, not elsewhere classified 12/25/2022 No Yes Inactive Problems Resolved Problems Electronic Signature(s) Signed: 01/08/2023 2:51:29 PM By: Geralyn Corwin DO Entered By: Geralyn Corwin on 01/08/2023 14:43:45 [name] ([id number]) [id number] Page 5 of 11 -------------------------------------------------------------------------------- Progress Note Details Patient Name: Date of Service: [name], [name] 01/08/2023 2:00 PM Medical Record Number: [id number] Patient Account Number: [id number] Date of Birth/Sex: Treating RN: 1940-10-30 (82 y.o. F) Primary Care Provider: Aliene Beams Other Clinician: Referring Provider: Treating Provider/Extender: Marguerite Olea in Treatment: 2 Subjective Chief Complaint Information obtained from Patient 03/28/2020; patient is here for review of wounds on her bilateral lower legs 12/25/2022; bilateral lower extremity wound History of Present Illness (HPI) ADMISSION 03/28/2020 This is a 82 year old woman who is accompanied by her daughter. She is moving to [place] from [place] where she lives. Infectious been going to the wound care center in [place] for the last 2 months. She apparently did not tolerate compression early on in that clinic stay. She is just using dry gauze over the wounds when she came in today. She tells me she has had both arterial and venous reflux studies although she does not have copies of these. She was apparently offered an ablation but I have no further information on that. She also is a type II diabetic. She was noted to have wounds on her lower extremities during a CHF clinic visit with Dr. Gala Romney and she was referred here. She has several scattered areas on the right medial lower leg and a  large area of superficial ulceration on the posterior medial left lower leg. past medical history; chronic kidney disease stage III, gastroesophageal reflux disease, type 2 diabetes with neuropathy, chronic diastolic heart failure, obstructive sleep apnea, hypertension, restless leg syndrome, pulmonary venous hypertension, history of colon CA, pacemaker and apparently venous reflux. We did not do arterial studies in the clinic today we are going to try to get the results that were already done within the last 2 months in [place] 7/26; patient readmitted to the clinic last week with predominantly chronic venous wounds almost circumferentially in the left lower leg and the right leg medially. She also has secondary lymphedema. We put her in compression. She went to the ER in [place] on Friday they remove the wrap on the left leg diagnosed her with cellulitis and put her on doxycycline. They apparently also did a ultrasound that was negative for DVT . We did not get any vascular information from [facility] where she apparently had arterial studies and venous studies. She is currently moving from [place] to [place] is up on her feet quite a bit. She was referred to vein and vascular here but never got to the appointment. ABIs were obtained here at 0.89 on the right and 1.02 on the left 8/9-Patient returns after establishing in clinic last visit, we have been doing 3 layer compression on both sides with calcium alginate, she is following up with the vein and vascular when she gets an appointment. The right side is healed the left side is still got some small open areas She has appt on Wed at Vein and vascular at which point she will come in for Nurse visit here 8/16; the patient arrives with all of her wounds closed. She has bilateral Juzo stockings. She saw Dr. Darrick Penna on 8/11 to review her reflux studies from the same day. On the right she had no evidence of DVT or SVT She  did have deep  venous reflux in the common femoral vein as well as superficial venous reflux in the . small saphenous vein of the mid calf on the left again no thrombus in the deep vein or superficial veins. Deep vein reflux in the common femoral vein as well as the great saphenous vein. She saw Dr. Darrick Penna in consult. Also of note he noted that she had a normal arterial scan done in [place] in April 2021, we were never able to obtain this. His feeling was that the patient did have a dilated greater saphenous vein however minimal to no evidence of reflux in her superficial venous system. She also was felt to have mild deep vein reflux and some degree of lymphedema. He recommended continued compression of the lower extremities with intermittent Unna boots if necessary. She was told to elevate her legs. If she develops recurrent ulcerations they were not opposed to revisiting the repeat venous reflux exam to see if she develops worsening reflux for consideration of laser ablation READMISSION 07/26/2020 Patient is now an 82 year old woman who lives in [place]. We had her in the clinic in the summer with wounds on her bilateral lower legs secondary to chronic venous insufficiency with some degree of lymphedema. She was seen by Dr. Darrick Penna in August. This was predominantly to review her venous reflux. She quoted normal arterial studies done in [place] in April 2021 but I have never been able to see these myself.Marland Kitchen His feeling was the patient did have a bilateral great dilated greater saphenous vein however minimal to no evidence of reflux in her superficial venous system. She was also felt to have mild deep vein reflux and some degree of lymphedema. He recommended compression and leg elevation.Marland Kitchen He was not opposed to repeating her venous reflux studies at some point if she develops worse to see if she develops worsening reflux for consideration of ablation. We discharged the patient with juxta lite stockings  bilaterally and she has been compliant with wearing these. Unfortunately in September she had a fall suffering a right tibial fracture requiring an IM nail with a rod. She also had fibular fractures in at least one place. She was sent to skilled facility for rehabilitation she is now back at home. She had a DVT rule out study on 9/24 that was negative for DVT . She tells [place] that in the last 2 or 3 weeks she is developed blisters on her right lower extremity that ruptured into 2 open wounds. She has not been able to get these to close over. She religiously uses her juxta lite stockings on both legs and she gets them on is tight as she can. Originally the left leg was the larger of the 2 legs but since the patient had surgery the right leg is more swollen and tense than the left. She also tells me she had a second fall yesterday and has had pain in the left dorsal midfoot. She has diabetic neuropathy with Charcot foot left greater than right. Past medical history includes chronic kidney disease, hypertension, type 2 diabetes with peripheral neuropathy, congestive heart failure, hypertension, history of colon CA, lumbar stenosis ABIs in our clinic were noncompressible bilaterally 11/30; the wounds in the right lower extremity are healed anteriorly. She has both Tubigrip and her juxta lite stockings she uses the Tubigrip as the primary layer. We went over this with her today she has the bilateral stockings. She will need to adjust her compression to roughly 30 mmHg 12/25/2022 Mrs.  Naveena Eyman is a 82 year old female with a past medical history Of CKD stage IV, lymphedema/venous insufficiency, controlled type 2 diabetes that presents the clinic for a 1oo21-month history of bilateral lower extremity wounds. She has difficulty controlling the edema and is currently on metolazone 2.5 mg once weekly and torsemide 40 mg daily. She sleeps in a recliner. She has been using antibiotic ointment to the wound beds.  She states she has Juxta lite compressions which she uses daily. She currently denies signs of infection. [name], [name] ([id number]) [id number] Page 6 of 11 4/23; patient presents for follow-up. We have been using Santyl and Hydrofera Blue under Kerlix/Coban to the lower extremities bilaterally. She tolerated this well. 4/30; patient presents for follow-up. We have been using silver alginate under 3 layer compression. Patient did not tolerate the wraps and took these off last week. She has significant swelling on exam. She does report that her cardiologist recommended increasing the diuretic regimen at one point to help with her lower extremity edema however she did not do this. Patient History Information obtained from Patient. Family History Diabetes - Siblings, Heart Disease - Mother, Hypertension - Mother, Kidney Disease - Siblings, No family history of Cancer, Hereditary Spherocytosis, Lung Disease, Seizures, Stroke, Thyroid Problems, Tuberculosis. Social History Never smoker, Marital Status - Widowed, Alcohol Use - Rarely, Drug Use - No History, Caffeine Use - Daily. Medical History Eyes Denies history of Cataracts, Glaucoma, Optic Neuritis Ear/Nose/Mouth/Throat Denies history of Chronic sinus problems/congestion, Middle ear problems Hematologic/Lymphatic Patient has history of Lymphedema Denies history of Anemia, Hemophilia, Human Immunodeficiency Virus, Sickle Cell Disease Respiratory Denies history of Aspiration, Asthma, Chronic Obstructive Pulmonary Disease (COPD), Pneumothorax, Sleep Apnea, Tuberculosis Cardiovascular Patient has history of Congestive Heart Failure, Hypertension, Peripheral Venous Disease Denies history of Angina, Arrhythmia, Coronary Artery Disease, Deep Vein Thrombosis, Hypotension, Myocardial Infarction, Peripheral Arterial Disease, Phlebitis, Vasculitis Gastrointestinal Denies history of Cirrhosis , Colitis, Crohnoos,  Hepatitis A, Hepatitis B, Hepatitis C Endocrine Patient has history of Type II Diabetes Denies history of Type I Diabetes Genitourinary Denies history of End Stage Renal Disease Immunological Denies history of Lupus Erythematosus, Raynaudoos, Scleroderma Integumentary (Skin) Denies history of History of Burn Musculoskeletal Patient has history of Osteoarthritis Neurologic Patient has history of Neuropathy Denies history of Dementia, Quadriplegia, Paraplegia, Seizure Disorder Oncologic Denies history of Received Chemotherapy, Received Radiation Psychiatric Patient has history of Confinement Anxiety Denies history of Anorexia/bulimia Hospitalization/Surgery History - tibia nail insertion right. - cardiac cath. - lumbar laminectomy. - pacemaker implanted. - appendectomy. - lumpectomy left breast. - DandC uterus. - partial nephrectomy. - tosillectomy. - tubal ligation. Medical A Surgical History Notes nd Constitutional Symptoms (General Health) obesity Gastrointestinal GERD Endocrine thyroid nodule Genitourinary CKD stage 3 Musculoskeletal fibromyalgia Oncologic hx colon CA Objective Constitutional respirations regular, non-labored and within target range for patient.. Vitals Time Taken: 2:21 PM, Temperature: 98.1 F, Pulse: 74 bpm, Respiratory Rate: 20 breaths/min, Blood Pressure: 129/61 mmHg. Cardiovascular 2+ dorsalis pedis/posterior tibialis pulses. [name], [name] ([id number]) [id number] Page 7 of 11 Psychiatric pleasant and cooperative. General Notes: Scattered open areas to the lower extremities bilaterally a limited skin breakdown. Significant hemosiderin staining noted noted throughout the legs. 3+ pitting edema to the knees bilaterally. No signs of surrounding infection including increased warmth, erythema or purulent drainage. Integumentary (Hair, Skin) Wound #5 status is Open. Original cause of wound was Gradually Appeared. The date  acquired was: 11/22/2022. The wound has been in treatment 2 weeks. The wound is located on the Right,Anterior Lower Leg. The wound measures  1.5cm length x 1.5cm width x 0.1cm depth; 1.767cm^2 area and 0.177cm^3 volume. There is Fat Layer (Subcutaneous Tissue) exposed. There is no tunneling or undermining noted. There is a medium amount of serosanguineous drainage noted. The wound margin is distinct with the outline attached to the wound base. There is large (67-100%) red, pink granulation within the wound bed. There is a small (1-33%) amount of necrotic tissue within the wound bed including Adherent Slough. The periwound skin appearance exhibited: Hemosiderin Staining. The periwound skin appearance did not exhibit: Callus, Crepitus, Excoriation, Induration, Rash, Scarring, Dry/Scaly, Maceration, Atrophie Blanche, Cyanosis, Ecchymosis, Mottled, Pallor, Rubor, Erythema. Wound #6 status is Open. Original cause of wound was Blister. The date acquired was: 12/10/2022. The wound has been in treatment 2 weeks. The wound is located on the Left,Dorsal Foot. The wound measures 0.3cm length x 0.3cm width x 0.1cm depth; 0.071cm^2 area and 0.007cm^3 volume. There is Fat Layer (Subcutaneous Tissue) exposed. There is no tunneling or undermining noted. There is a medium amount of serosanguineous drainage noted. The wound margin is distinct with the outline attached to the wound base. There is small (1-33%) red granulation within the wound bed. There is a large (67-100%) amount of necrotic tissue within the wound bed including Adherent Slough. The periwound skin appearance did not exhibit: Callus, Crepitus, Excoriation, Induration, Rash, Scarring, Dry/Scaly, Maceration, Atrophie Blanche, Cyanosis, Ecchymosis, Hemosiderin Staining, Mottled, Pallor, Rubor, Erythema. Wound #7 status is Open. Original cause of wound was Gradually Appeared. The date acquired was: 01/08/2023. The wound is located on the Right,Medial Ankle. The  wound measures 0.4cm length x 0.4cm width x 0.1cm depth; 0.126cm^2 area and 0.013cm^3 volume. There is Fat Layer (Subcutaneous Tissue) exposed. There is no tunneling or undermining noted. There is a medium amount of serosanguineous drainage noted. The wound margin is distinct with the outline attached to the wound base. There is large (67-100%) red granulation within the wound bed. There is no necrotic tissue within the wound bed. The periwound skin appearance exhibited: Hemosiderin Staining. The periwound skin appearance did not exhibit: Callus, Crepitus, Excoriation, Induration, Rash, Scarring, Dry/Scaly, Maceration, Atrophie Blanche, Cyanosis, Ecchymosis, Mottled, Pallor, Rubor, Erythema. Assessment Active Problems ICD-10 Non-pressure chronic ulcer of other part of right lower leg with fat layer exposed Non-pressure chronic ulcer of other part of left lower leg with fat layer exposed Chronic venous hypertension (idiopathic) with ulcer of right lower extremity Type 2 diabetes mellitus with other skin ulcer Lymphedema, not elsewhere classified Patient's wounds are stable. She has significant swelling on exam and I recommended she talk with her cardiologist to discuss her diuretic regiment. Our compression wraps are not enough to control her edema. At this time I recommended changing the wrap to an Unna boot and using silver alginate underneath on the wound beds. She denies signs of infection. Procedures Wound #5 Pre-procedure diagnosis of Wound #5 is a Lymphedema located on the Right,Anterior Lower Leg . There was a Radio broadcast assistant Compression Therapy Procedure by Shawn Stall, RN. Post procedure Diagnosis Wound #5: Same as Pre-Procedure Wound #6 Pre-procedure diagnosis of Wound #6 is a Diabetic Wound/Ulcer of the Lower Extremity located on the Left,Dorsal Foot . There was a Radio broadcast assistant Compression Therapy Procedure by Shawn Stall, RN. Post procedure Diagnosis Wound #6: Same as  Pre-Procedure Wound #7 Pre-procedure diagnosis of Wound #7 is a Diabetic Wound/Ulcer of the Lower Extremity located on the Right,Medial Ankle . There was a Radio broadcast assistant Compression Therapy Procedure by Shawn Stall, RN. Post procedure Diagnosis Wound #7: Same  as Pre-Procedure Plan Follow-up Appointments: Return Appointment in 1 week. - Dr Mikey Bussing - Room 9 01/14/23 @ 330pm Other: - Speak with Cardiologist about fluid overfload Anesthetic: Wound #5 Right,Anterior Lower Leg: (In clinic) Topical Lidocaine 4% applied to wound bed Wound #6 Left,Dorsal Foot: Riggi, Bosie Clos ([id number]) [id number] Page 8 of 11 (In clinic) Topical Lidocaine 4% applied to wound bed Bathing/ Shower/ Hygiene: May shower with protection but do not get wound dressing(s) wet. Protect dressing(s) with water repellant cover (for example, large plastic bag) or a cast cover and may then take shower. Edema Control - Lymphedema / SCD / Other: Elevate legs to the level of the heart or above for 30 minutes daily and/or when sitting for 3-4 times a day throughout the day. Avoid standing for long periods of time. WOUND #5: - Lower Leg Wound Laterality: Right, Anterior Cleanser: Soap and Water 1 x Per Week/30 Days Discharge Instructions: May shower and wash wound with dial antibacterial soap and water prior to dressing change. Cleanser: Vashe 5.8 (oz) 1 x Per Week/30 Days Discharge Instructions: Cleanse the wound with Vashe prior to applying a clean dressing using gauze sponges, not tissue or cotton balls. Peri-Wound Care: Sween Lotion (Moisturizing lotion) 1 x Per Week/30 Days Discharge Instructions: Apply moisturizing lotion as directed Prim Dressing: Maxorb Extra Ag+ Alginate Dressing, 4x4.75 (in/in) 1 x Per Week/30 Days ary Discharge Instructions: Apply to wound bed as instructed Secondary Dressing: ABD Pad, 5x9 1 x Per Week/30 Days Discharge Instructions: Apply over primary dressing as  directed. Com pression Wrap: CoFlex Calamine Unna Boot 4 x 6 (in/yd) 1 x Per Week/30 Days Discharge Instructions: Apply Coflex Calamine [employer] as directed. WOUND #6: - Foot Wound Laterality: Dorsal, Left Cleanser: Soap and Water 1 x Per Week/30 Days Discharge Instructions: May shower and wash wound with dial antibacterial soap and water prior to dressing change. Cleanser: Vashe 5.8 (oz) 1 x Per Week/30 Days Discharge Instructions: Cleanse the wound with Vashe prior to applying a clean dressing using gauze sponges, not tissue or cotton balls. Peri-Wound Care: Sween Lotion (Moisturizing lotion) 1 x Per Week/30 Days Discharge Instructions: Apply moisturizing lotion as directed Prim Dressing: Maxorb Extra Ag+ Alginate Dressing, 4x4.75 (in/in) 1 x Per Week/30 Days ary Discharge Instructions: Apply to wound bed as instructed Secondary Dressing: ABD Pad, 5x9 1 x Per Week/30 Days Discharge Instructions: Apply over primary dressing as directed. Com pression Wrap: CoFlex Calamine Unna Boot 4 x 6 (in/yd) 1 x Per Week/30 Days Discharge Instructions: Apply Coflex Calamine [employer] as directed. WOUND #7: - Ankle Wound Laterality: Right, Medial Cleanser: Soap and Water 1 x Per Week/30 Days Discharge Instructions: May shower and wash wound with dial antibacterial soap and water prior to dressing change. Cleanser: Vashe 5.8 (oz) 1 x Per Week/30 Days Discharge Instructions: Cleanse the wound with Vashe prior to applying a clean dressing using gauze sponges, not tissue or cotton balls. Peri-Wound Care: Sween Lotion (Moisturizing lotion) 1 x Per Week/30 Days Discharge Instructions: Apply moisturizing lotion as directed Prim Dressing: Maxorb Extra Ag+ Alginate Dressing, 4x4.75 (in/in) 1 x Per Week/30 Days ary Discharge Instructions: Apply to wound bed as instructed Secondary Dressing: ABD Pad, 5x9 1 x Per Week/30 Days Discharge Instructions: Apply over primary dressing as directed. Com pression Wrap:  CoFlex Calamine Unna Boot 4 x 6 (in/yd) 1 x Per Week/30 Days Discharge Instructions: Apply Coflex Calamine [employer] as directed. 1. Follow-up with cardiology 2. Unna boot with silver alginate to the lower extremities bilaterally 3.  Follow-up in 1 week Electronic Signature(s) Signed: 01/08/2023 2:51:29 PM By: Geralyn Corwin DO Entered By: Geralyn Corwin on 01/08/2023 14:51:03 -------------------------------------------------------------------------------- HxROS Details Patient Name: Date of Service: [name], [name] 01/08/2023 2:00 PM Medical Record Number: [id number] Patient Account Number: [id number] Date of Birth/Sex: Treating RN: June 13, 1941 (82 y.o. F) Primary Care Provider: Aliene Beams Other Clinician: Referring Provider: Treating Provider/Extender: Marguerite Olea in Treatment: 2 Information Obtained From Patient Constitutional Symptoms (General Health) Medical History: Past Medical History Notes: obesity Eyes Medical History: Negative for: Cataracts; Glaucoma; Optic Neuritis [name], [name] ([id number]) [id number] Page 9 of 11 Ear/Nose/Mouth/Throat Medical History: Negative for: Chronic sinus problems/congestion; Middle ear problems Hematologic/Lymphatic Medical History: Positive for: Lymphedema Negative for: Anemia; Hemophilia; Human Immunodeficiency Virus; Sickle Cell Disease Respiratory Medical History: Negative for: Aspiration; Asthma; Chronic Obstructive Pulmonary Disease (COPD); Pneumothorax; Sleep Apnea; Tuberculosis Cardiovascular Medical History: Positive for: Congestive Heart Failure; Hypertension; Peripheral Venous Disease Negative for: Angina; Arrhythmia; Coronary Artery Disease; Deep Vein Thrombosis; Hypotension; Myocardial Infarction; Peripheral Arterial Disease; Phlebitis; Vasculitis Gastrointestinal Medical History: Negative for: Cirrhosis ; Colitis; Crohns; Hepatitis A; Hepatitis B; Hepatitis  C Past Medical History Notes: GERD Endocrine Medical History: Positive for: Type II Diabetes Negative for: Type I Diabetes Past Medical History Notes: thyroid nodule Time with diabetes: 15 Treated with: Insulin Blood sugar tested every day: Yes Tested : 2-3 times per day Genitourinary Medical History: Negative for: End Stage Renal Disease Past Medical History Notes: CKD stage 3 Immunological Medical History: Negative for: Lupus Erythematosus; Raynauds; Scleroderma Integumentary (Skin) Medical History: Negative for: History of Burn Musculoskeletal Medical History: Positive for: Osteoarthritis Past Medical History Notes: fibromyalgia Neurologic Medical History: Positive for: Neuropathy Negative for: Dementia; Quadriplegia; Paraplegia; Seizure Disorder Oncologic Medical History: Negative for: Received Chemotherapy; Received Radiation Past Medical History Notes: hx colon CA Psychiatric Medical HistoryNATE, [name] ([id number]) [id number] Page 10 of 11 Positive for: Confinement Anxiety Negative for: Anorexia/bulimia Immunizations Pneumococcal Vaccine: Received Pneumococcal Vaccination: No Implantable Devices Yes Hospitalization / Surgery History Type of Hospitalization/Surgery tibia nail insertion right cardiac cath lumbar laminectomy pacemaker implanted appendectomy lumpectomy left breast DandC uterus partial nephrectomy tosillectomy tubal ligation Family and Social History Cancer: No; Diabetes: Yes - Siblings; Heart Disease: Yes - Mother; Hereditary Spherocytosis: No; Hypertension: Yes - Mother; Kidney Disease: Yes - Siblings; Lung Disease: No; Seizures: No; Stroke: No; Thyroid Problems: No; Tuberculosis: No; Never smoker; Marital Status - Widowed; Alcohol Use: Rarely; Drug Use: No History; Caffeine Use: Daily; Financial Concerns: No; Food, Clothing or Shelter Needs: No; Support System Lacking: No; Transportation Concerns:  No Electronic Signature(s) Signed: 01/08/2023 2:51:29 PM By: Geralyn Corwin DO Entered By: Geralyn Corwin on 01/08/2023 14:45:59 -------------------------------------------------------------------------------- SuperBill Details Patient Name: Date of Service: [name], [name] 01/08/2023 Medical Record Number: [id number] Patient Account Number: [id number] Date of Birth/Sex: Treating RN: 20-Jul-1941 (82 y.o. [name], Millard.[place]oa Primary Care Provider: Aliene Beams Other Clinician: Referring Provider: Treating Provider/Extender: Marguerite Olea in Treatment: 2 Diagnosis Coding ICD-10 Codes Code Description [phone] Non-pressure chronic ulcer of other part of right lower leg with fat layer exposed L97.822 Non-pressure chronic ulcer of other part of left lower leg with fat layer exposed I87.311 Chronic venous hypertension (idiopathic) with ulcer of right lower extremity E11.622 Type 2 diabetes mellitus with other skin ulcer I89.0 Lymphedema, not elsewhere classified Facility Procedures : CPT4 Code: [id number] Description: 29580 - Joetta Manners BOOT/PROFO BILATERAL Modifier: Quantity: 1 Physician Procedures Electronic Signature(s) Signed: 01/08/2023 2:51:29 PM By: Geralyn Corwin DO Entered By: Geralyn Corwin on 01/08/2023 14:51:16

## 2023-01-10 NOTE — Progress Notes (Signed)
[name] ([id number]) [id number] Page 1 of 12 Visit Report for 01/08/2023 Arrival Information Details Patient Name: Date of Service: [name] 01/08/2023 2:00 PM Medical Record Number: [id number] Patient Account Number: [id number] Date of Birth/Sex: Treating RN: 19-Apr-1941 (82 y.o. F) Primary Care Ferry Matthis: Aliene Beams Other Clinician: Referring Sigifredo Pignato: Treating Lorilyn Laitinen/Extender: Marguerite Olea in Treatment: 2 Visit Information History Since Last Visit Added or deleted any medications: No Patient Arrived: [name] Any new allergies or adverse reactions: No Arrival Time: 14:08 Had a fall or experienced change in No Accompanied By: self activities of daily living that may affect Transfer Assistance: None risk of falls: Patient Identification Verified: Yes Signs or symptoms of abuse/neglect since last visito No Secondary Verification Process Completed: Yes Hospitalized since last visit: No Patient Requires Transmission-Based Precautions: No Implantable device outside of the clinic excluding No Patient Has Alerts: No cellular tissue based products placed in the center since last visit: Has Dressing in Place as Prescribed: Yes Pain Present Now: No Electronic Signature(s) Signed: 01/09/2023 4:40:31 PM By: Thayer Dallas Entered By: Thayer Dallas on 01/08/2023 14:14:24 -------------------------------------------------------------------------------- Compression Therapy Details Patient Name: Date of Service: [name], [name] 01/08/2023 2:00 PM Medical Record Number: [id number] Patient Account Number: [id number] Date of Birth/Sex: Treating RN: 01/21/1941 (82 y.o. Arta Silence Primary Care Irlanda Croghan: Aliene Beams Other Clinician: Referring Ziva Nunziata: Treating Johna Kearl/Extender: Marguerite Olea in Treatment: 2 Compression Therapy Performed for Wound Assessment: Wound #5 Right,Anterior Lower  Leg Performed By: Clinician Shawn Stall, RN Compression Type: Henriette Combs Post Procedure Diagnosis Same as Pre-procedure Electronic Signature(s) Signed: 01/08/2023 4:08:13 PM By: Shawn Stall RN, BSN Entered By: Shawn Stall on 01/08/2023 14:35:34 -------------------------------------------------------------------------------- Compression Therapy Details Patient Name: Date of Service: [name], [name] 01/08/2023 2:00 PM Medical Record Number: [id number] Patient Account Number: [id number] Date of Birth/Sex: Treating RN: March 02, 1941 (82 y.o. Arta Silence Primary Care Malon Branton: Aliene Beams Other Clinician: Referring Briawna Carver: Treating Shameca Landen/Extender: Marguerite Olea in Treatment: 2 Compression Therapy Performed for Wound Assessment: Wound #7 Right,Medial Ankle Performed By: Clinician Shawn Stall, RN Compression Type: Henriette Combs Post Procedure Diagnosis Same as Meridee Score ([id number]) [id number] Page 2 of 12 Electronic Signature(s) Signed: 01/08/2023 4:08:13 PM By: Shawn Stall RN, BSN Entered By: Shawn Stall on 01/08/2023 14:35:34 -------------------------------------------------------------------------------- Compression Therapy Details Patient Name: Date of Service: [name], [name] 01/08/2023 2:00 PM Medical Record Number: [id number] Patient Account Number: [id number] Date of Birth/Sex: Treating RN: 1941-02-12 (82 y.o. Arta Silence Primary Care Shandon Matson: Aliene Beams Other Clinician: Referring Demitrious Mccannon: Treating Maleena Eddleman/Extender: Marguerite Olea in Treatment: 2 Compression Therapy Performed for Wound Assessment: Wound #6 Left,Dorsal Foot Performed By: Clinician Shawn Stall, RN Compression Type: Henriette Combs Post Procedure Diagnosis Same as Pre-procedure Electronic Signature(s) Signed: 01/08/2023 4:08:13 PM By: Shawn Stall RN, BSN Entered By: Shawn Stall on 01/08/2023  14:35:34 -------------------------------------------------------------------------------- Encounter Discharge Information Details Patient Name: Date of Service: [name], [name] 01/08/2023 2:00 PM Medical Record Number: [id number] Patient Account Number: [id number] Date of Birth/Sex: Treating RN: August 03, 1941 (81 y.o. Arta Silence Primary Care Michaeal Davis: Aliene Beams Other Clinician: Referring Lisamarie Coke: Treating Danahi Reddish/Extender: Marguerite Olea in Treatment: 2 Encounter Discharge Information Items Discharge Condition: Stable Ambulatory Status: Ambulatory Discharge Destination: Home Transportation: Private Auto Accompanied By: daughter Schedule Follow-up Appointment: Yes Clinical Summary of Care: Electronic Signature(s) Signed: 01/08/2023 4:08:13 PM By: Shawn Stall RN, BSN Entered By: Shawn Stall on 01/08/2023 14:36:16 -------------------------------------------------------------------------------- Lower Extremity Assessment Details Patient Name: Date of Service:  [name], [name] 01/08/2023 2:00 PM Medical Record Number: [id number] Patient Account Number: [id number] Date of Birth/Sex: Treating RN: [birth year] (82 y.o. F) Primary Care Tommi Crepeau: Aliene Beams Other Clinician: Referring Annya Lizana: Treating Dariana Garbett/Extender: Marguerite Olea in Treatment: 2 Edema Assessment Assessed: [Left: Yes] [Right: Yes] Edema: [Left: Yes] [Right: Yes] Calf Left: Right: Point of Measurement: 28 cm From Medial Instep 43 cm 43.5 cm [name] ([id number]) [id number] Page 3 of 12 Ankle Left: Right: Point of Measurement: 11 cm From Medial Instep 31 cm 34 cm Vascular Assessment Pulses: Dorsalis Pedis Palpable: [Left:Yes] [Right:Yes] Electronic Signature(s) Signed: 01/09/2023 4:40:31 PM By: Thayer Dallas Entered By: Thayer Dallas on 01/08/2023  14:22:56 -------------------------------------------------------------------------------- Multi Wound Chart Details Patient Name: Date of Service: [name], [name] 01/08/2023 2:00 PM Medical Record Number: [id number] Patient Account Number: [id number] Date of Birth/Sex: Treating RN: [birth year] (82 y.o. F) Primary Care Katlin Ciszewski: Aliene Beams Other Clinician: Referring Jamarii Banks: Treating Josemanuel Eakins/Extender: Marguerite Olea in Treatment: 2 Vital Signs Height(in): Pulse(bpm): 74 Weight(lbs): Blood Pressure(mmHg): 129/61 Body Mass Index(BMI): Temperature(F): 98.1 Respiratory Rate(breaths/min): 20 [5:Photos:] Right, Anterior Lower Leg Left, Dorsal Foot Right, Medial Ankle Wound Location: Gradually Appeared Blister Gradually Appeared Wounding Event: Lymphedema Diabetic Wound/Ulcer of the Lower Diabetic Wound/Ulcer of the Lower Primary Etiology: Extremity Extremity N/A N/A Lymphedema Secondary Etiology: Lymphedema, Congestive Heart Lymphedema, Congestive Heart Lymphedema, Congestive Heart Comorbid History: Failure, Hypertension, Peripheral Failure, Hypertension, Peripheral Failure, Hypertension, Peripheral Venous Disease, Type II Diabetes, Venous Disease, Type II Diabetes, Venous Disease, Type II Diabetes, Osteoarthritis, Neuropathy, Osteoarthritis, Neuropathy, Osteoarthritis, Neuropathy, Confinement Anxiety Confinement Anxiety Confinement Anxiety 11/22/2022 12/10/2022 01/08/2023 Date Acquired: 2 2 0 Weeks of Treatment: Open Open Open Wound Status: No No No Wound Recurrence: 1.5x1.5x0.1 0.3x0.3x0.1 0.4x0.4x0.1 Measurements L x W x D (cm) 1.767 0.071 0.126 A (cm) : rea 0.177 0.007 0.013 Volume (cm) : -462.70% 43.70% N/A % Reduction in Area: -181.00% 46.20% N/A % Reduction in Volume: Full Thickness Without Exposed Grade 1 Grade 1 Classification: Support Structures Medium Medium Medium Exudate Amount: Serosanguineous Serosanguineous  Serosanguineous Exudate Type: red, brown red, brown red, brown Exudate Color: Distinct, outline attached Distinct, outline attached Distinct, outline attached Wound Margin: Large (67-100%) Small (1-33%) Large (67-100%) Granulation Amount: Red, Pink Red Red Granulation Quality: Small (1-33%) Large (67-100%) None Present (0%) Necrotic Amount: Fat Layer (Subcutaneous Tissue): Yes Fat Layer (Subcutaneous Tissue): Yes Fat Layer (Subcutaneous Tissue): Yes Exposed Structures: Fascia: No Fascia: No Fascia: No Tendon: No Tendon: No Tendon: No Giannelli, Nataly ([id number]) [id number] Page 4 of 12 Muscle: No Muscle: No Muscle: No Joint: No Joint: No Joint: No Bone: No Bone: No Bone: No Small (1-33%) Large (67-100%) Small (1-33%) Epithelialization: Excoriation: No Excoriation: No Excoriation: No Periwound Skin Texture: Induration: No Induration: No Induration: No Callus: No Callus: No Callus: No Crepitus: No Crepitus: No Crepitus: No Rash: No Rash: No Rash: No Scarring: No Scarring: No Scarring: No Maceration: No Maceration: No Maceration: No Periwound Skin Moisture: Dry/Scaly: No Dry/Scaly: No Dry/Scaly: No Hemosiderin Staining: Yes Atrophie Blanche: No Hemosiderin Staining: Yes Periwound Skin Color: Atrophie Blanche: No Cyanosis: No Atrophie Blanche: No Cyanosis: No Ecchymosis: No Cyanosis: No Ecchymosis: No Erythema: No Ecchymosis: No Erythema: No Hemosiderin Staining: No Erythema: No Mottled: No Mottled: No Mottled: No Pallor: No Pallor: No Pallor: No Rubor: No Rubor: No Rubor: No Compression Therapy Compression Therapy Compression Therapy Procedures Performed: Treatment Notes Wound #5 (Lower Leg) Wound Laterality: Right, Anterior Cleanser Soap and Water Discharge Instruction: May shower and wash wound with dial antibacterial  soap and water prior to dressing change. Vashe 5.8 (oz) Discharge Instruction: Cleanse  the wound with Vashe prior to applying a clean dressing using gauze sponges, not tissue or cotton balls. Peri-Wound Care Sween Lotion (Moisturizing lotion) Discharge Instruction: Apply moisturizing lotion as directed Topical Primary Dressing Maxorb Extra Ag+ Alginate Dressing, 4x4.75 (in/in) Discharge Instruction: Apply to wound bed as instructed Secondary Dressing ABD Pad, 5x9 Discharge Instruction: Apply over primary dressing as directed. Secured With Compression Wrap CoFlex Calamine Unna Boot 4 x 6 (in/yd) Discharge Instruction: Apply Coflex [employer]amine [employer] as directed. Compression Stockings Add-Ons Wound #6 (Foot) Wound Laterality: Dorsal, Left Cleanser Soap and Water Discharge Instruction: May shower and wash wound with dial antibacterial soap and water prior to dressing change. Vashe 5.8 (oz) Discharge Instruction: Cleanse the wound with Vashe prior to applying a clean dressing using gauze sponges, not tissue or cotton balls. Peri-Wound Care Sween Lotion (Moisturizing lotion) Discharge Instruction: Apply moisturizing lotion as directed Topical Primary Dressing Maxorb Extra Ag+ Alginate Dressing, 4x4.75 (in/in) Discharge Instruction: Apply to wound bed as instructed Secondary Dressing ABD Pad, 5x9 Discharge Instruction: Apply over primary dressing as directed. DORANN, DAVIDSON ([id number]) [id number] Page 5 of 12 Secured With Compression Wrap CoFlex Calamine [employer] 4 x 6 (in/yd) Discharge Instruction: Apply Coflex Calamine [employer] as directed. Compression Stockings Add-Ons Wound #7 (Ankle) Wound Laterality: Right, Medial Cleanser Soap and Water Discharge Instruction: May shower and wash wound with dial antibacterial soap and water prior to dressing change. Vashe 5.8 (oz) Discharge Instruction: Cleanse the wound with Vashe prior to applying a clean dressing using gauze sponges, not tissue or cotton balls. Peri-Wound Care Sween Lotion  (Moisturizing lotion) Discharge Instruction: Apply moisturizing lotion as directed Topical Primary Dressing Maxorb Extra Ag+ Alginate Dressing, 4x4.75 (in/in) Discharge Instruction: Apply to wound bed as instructed Secondary Dressing ABD Pad, 5x9 Discharge Instruction: Apply over primary dressing as directed. Secured With Compression Wrap CoFlex Calamine Unna Boot 4 x 6 (in/yd) Discharge Instruction: Apply Coflex Calamine [employer] as directed. Compression Stockings Add-Ons Electronic Signature(s) Signed: 01/08/2023 2:51:29 PM By: Geralyn Corwin DO Entered By: Geralyn Corwin on 01/08/2023 14:43:52 -------------------------------------------------------------------------------- Multi-Disciplinary Care Plan Details Patient Name: Date of Service: TYLA, BURGNER 01/08/2023 2:00 PM Medical Record Number: [id number] Patient Account Number: [id number] Date of Birth/Sex: Treating RN: 1941-08-27 (82 y.o. Arta Silence Primary Care Silviano Neuser: Aliene Beams Other Clinician: Referring Matrice Herro: Treating Kynnadi Dicenso/Extender: Marguerite Olea in Treatment: 2 Active Inactive Venous Leg Ulcer Nursing Diagnoses: Knowledge deficit related to disease process and management Potential for venous Insuffiency (use before diagnosis confirmed) Goals: Patient will maintain optimal edema control Date Initiated: 12/25/2022 Target Resolution Date: 01/30/2023 Goal Status: Active Patient/caregiver will verbalize understanding of disease process and disease management Date Initiated: 12/25/2022 Target Resolution Date: 01/24/2023 MEEYAH, OVITT ([id number]) [phone].pdf Page 6 of 12 Goal Status: Active Interventions: Assess peripheral edema status every visit. Compression as ordered Provide education on venous insufficiency Notes: Wound/Skin Impairment Nursing Diagnoses: Impaired tissue integrity Knowledge deficit related to ulceration/compromised skin  integrity Goals: Patient/caregiver will verbalize understanding of skin care regimen Date Initiated: 12/25/2022 Target Resolution Date: 02/07/2023 Goal Status: Active Ulcer/skin breakdown will have a volume reduction of 30% by week 4 Date Initiated: 12/25/2022 Target Resolution Date: 01/22/2023 Goal Status: Active Interventions: Assess patient/caregiver ability to obtain necessary supplies Assess patient/caregiver ability to perform ulcer/skin care regimen upon admission and as needed Assess ulceration(s) every visit Provide education on ulcer and skin care Notes: Electronic Signature(s) Signed: 01/08/2023 4:08:13 PM By:  Shawn Stall RN, BSN Entered By: Shawn Stall on 01/08/2023 14:25:49 -------------------------------------------------------------------------------- Pain Assessment Details Patient Name: Date of Service: [name], [name] 01/08/2023 2:00 PM Medical Record Number: [id number] Patient Account Number: [id number] Date of Birth/Sex: Treating RN: 10-31-1940 (82 y.o. F) Primary Care Demetre Monaco: Aliene Beams Other Clinician: Referring Alexes Lamarque: Treating Spenser Harren/Extender: Marguerite Olea in Treatment: 2 Active Problems Location of Pain Severity and Description of Pain Patient Has Paino No Site Locations Pain Management and Medication Current Pain ManagementBUNNIE, REHBERG ([id number]) [id number] Page 7 of 12 Electronic Signature(s) Signed: 01/09/2023 4:40:31 PM By: Thayer Dallas Entered By: Thayer Dallas on 01/08/2023 14:14:36 -------------------------------------------------------------------------------- Patient/Caregiver Education Details Patient Name: Date of Service: Charline Bills 4/30/2024andnbsp2:00 PM Medical Record Number: [id number] Patient Account Number: [id number] Date of Birth/Gender: Treating RN: 05-11-1941 (82 y.o. Arta Silence Primary Care Physician: Aliene Beams Other Clinician: Referring  Physician: Treating Physician/Extender: Marguerite Olea in Treatment: 2 Education Assessment Education Provided To: Patient Education Topics Provided Wound/Skin Impairment: Handouts: Caring for Your Ulcer Methods: Explain/Verbal Responses: Reinforcements needed Electronic Signature(s) Signed: 01/08/2023 4:08:13 PM By: Shawn Stall RN, BSN Entered By: Shawn Stall on 01/08/2023 14:26:02 -------------------------------------------------------------------------------- Wound Assessment Details Patient Name: Date of Service: [name], [name] 01/08/2023 2:00 PM Medical Record Number: [id number] Patient Account Number: [id number] Date of Birth/Sex: Treating RN: [birth year] (82 y.o. F) Primary Care Pelham Hennick: Aliene Beams Other Clinician: Referring Jacody Beneke: Treating Kamdyn Covel/Extender: Marguerite Olea in Treatment: 2 Wound Status Wound Number: 5 Primary Lymphedema Etiology: Wound Location: Right, Anterior Lower Leg Wound Open Wounding Event: Gradually Appeared Status: Date Acquired: 11/22/2022 Comorbid Lymphedema, Congestive Heart Failure, Hypertension, Peripheral Weeks Of Treatment: 2 History: Venous Disease, Type II Diabetes, Osteoarthritis, Neuropathy, Clustered Wound: No Confinement Anxiety Photos Wound Measurements Length: (cm) 1.5 Width: (cm) 1.5 Depth: (cm) 0.1 Area: (cm) 1.767 Marcell, Analicia ([id number]) Volume: (cm) 0.177 % Reduction in Area: -462.7% % Reduction in Volume: -181% Epithelialization: Small (1-33%) Tunneling: No [id number] Page 8 of 12 Undermining: No Wound Description Classification: Full Thickness Without Exposed Support Structures Wound Margin: Distinct, outline attached Exudate Amount: Medium Exudate Type: Serosanguineous Exudate Color: red, brown Foul Odor After Cleansing: No Slough/Fibrino Yes Wound Bed Granulation Amount: Large (67-100%) Exposed Structure Granulation  Quality: Red, Pink Fascia Exposed: No Necrotic Amount: Small (1-33%) Fat Layer (Subcutaneous Tissue) Exposed: Yes Necrotic Quality: Adherent Slough Tendon Exposed: No Muscle Exposed: No Joint Exposed: No Bone Exposed: No Periwound Skin Texture Texture Color No Abnormalities Noted: No No Abnormalities Noted: No Callus: No Atrophie Blanche: No Crepitus: No Cyanosis: No Excoriation: No Ecchymosis: No Induration: No Erythema: No Rash: No Hemosiderin Staining: Yes Scarring: No Mottled: No Pallor: No Moisture Rubor: No No Abnormalities Noted: No Dry / Scaly: No Maceration: No Treatment Notes Wound #5 (Lower Leg) Wound Laterality: Right, Anterior Cleanser Soap and Water Discharge Instruction: May shower and wash wound with dial antibacterial soap and water prior to dressing change. Vashe 5.8 (oz) Discharge Instruction: Cleanse the wound with Vashe prior to applying a clean dressing using gauze sponges, not tissue or cotton balls. Peri-Wound Care Sween Lotion (Moisturizing lotion) Discharge Instruction: Apply moisturizing lotion as directed Topical Primary Dressing Maxorb Extra Ag+ Alginate Dressing, 4x4.75 (in/in) Discharge Instruction: Apply to wound bed as instructed Secondary Dressing ABD Pad, 5x9 Discharge Instruction: Apply over primary dressing as directed. Secured With Compression Wrap CoFlex Calamine Unna Boot 4 x 6 (in/yd) Discharge Instruction: Apply Coflex [employer]amine [employer] as directed. Compression Stockings Add-Ons Electronic Signature(s) Signed: 01/09/2023 4:40:31 PM By:  Thayer Dallas Entered By: Thayer Dallas on 01/08/2023 14:26:14 [name] ([id number]) [id number] Page 9 of 12 -------------------------------------------------------------------------------- Wound Assessment Details Patient Name: Date of Service: [name], [name] 01/08/2023 2:00 PM Medical Record Number: [id number] Patient Account Number:  [id number] Date of Birth/Sex: Treating RN: 1941-06-06 (82 y.o. F) Primary Care Khameron Gruenwald: Aliene Beams Other Clinician: Referring Cj Edgell: Treating Anjalina Bergevin/Extender: Marguerite Olea in Treatment: 2 Wound Status Wound Number: 6 Primary Diabetic Wound/Ulcer of the Lower Extremity Etiology: Wound Location: Left, Dorsal Foot Wound Open Wounding Event: Blister Status: Date Acquired: 12/10/2022 Comorbid Lymphedema, Congestive Heart Failure, Hypertension, Peripheral Weeks Of Treatment: 2 History: Venous Disease, Type II Diabetes, Osteoarthritis, Neuropathy, Clustered Wound: No Confinement Anxiety Photos Wound Measurements Length: (cm) 0 Width: (cm) 0 Depth: (cm) 0 Area: (cm) Volume: (cm) .3 % Reduction in Area: 43.7% .3 % Reduction in Volume: 46.2% .1 Epithelialization: Large (67-100%) 0.071 Tunneling: No 0.007 Undermining: No Wound Description Classification: Grade 1 Wound Margin: Distinct, outline attached Exudate Amount: Medium Exudate Type: Serosanguineous Exudate Color: red, brown Foul Odor After Cleansing: No Slough/Fibrino Yes Wound Bed Granulation Amount: Small (1-33%) Exposed Structure Granulation Quality: Red Fascia Exposed: No Necrotic Amount: Large (67-100%) Fat Layer (Subcutaneous Tissue) Exposed: Yes Necrotic Quality: Adherent Slough Tendon Exposed: No Muscle Exposed: No Joint Exposed: No Bone Exposed: No Periwound Skin Texture Texture Color No Abnormalities Noted: No No Abnormalities Noted: No Callus: No Atrophie Blanche: No Crepitus: No Cyanosis: No Excoriation: No Ecchymosis: No Induration: No Erythema: No Rash: No Hemosiderin Staining: No Scarring: No Mottled: No Pallor: No Moisture Rubor: No No Abnormalities Noted: No Dry / Scaly: No Maceration: No Treatment Notes Wound #6 (Foot) Wound Laterality: Dorsal, Left Spieth, Becci ([id number]) [id number] Page 10 of 12 Cleanser Soap and  Water Discharge Instruction: May shower and wash wound with dial antibacterial soap and water prior to dressing change. Vashe 5.8 (oz) Discharge Instruction: Cleanse the wound with Vashe prior to applying a clean dressing using gauze sponges, not tissue or cotton balls. Peri-Wound Care Sween Lotion (Moisturizing lotion) Discharge Instruction: Apply moisturizing lotion as directed Topical Primary Dressing Maxorb Extra Ag+ Alginate Dressing, 4x4.75 (in/in) Discharge Instruction: Apply to wound bed as instructed Secondary Dressing ABD Pad, 5x9 Discharge Instruction: Apply over primary dressing as directed. Secured With Compression Wrap CoFlex Calamine Unna Boot 4 x 6 (in/yd) Discharge Instruction: Apply Coflex [employer]amine [employer] as directed. Compression Stockings Add-Ons Electronic Signature(s) Signed: 01/09/2023 4:40:31 PM By: Thayer Dallas Entered By: Thayer Dallas on 01/08/2023 14:26:55 -------------------------------------------------------------------------------- Wound Assessment Details Patient Name: Date of Service: [name], [name] 01/08/2023 2:00 PM Medical Record Number: [id number] Patient Account Number: [id number] Date of Birth/Sex: Treating RN: 05-14-1941 (82 y.o. [name], Millard.Loa Primary Care Kortnie Stovall: Aliene Beams Other Clinician: Referring Tianna Baus: Treating Granger Chui/Extender: Marguerite Olea in Treatment: 2 Wound Status Wound Number: 7 Primary Diabetic Wound/Ulcer of the Lower Extremity Etiology: Wound Location: Right, Medial Ankle Secondary Lymphedema Wounding Event: Gradually Appeared Etiology: Date Acquired: 01/08/2023 Wound Open Weeks Of Treatment: 0 Status: Clustered Wound: No Comorbid Lymphedema, Congestive Heart Failure, Hypertension, Peripheral History: Venous Disease, Type II Diabetes, Osteoarthritis, Neuropathy, Confinement Anxiety Photos Wound Measurements Length: (cm) 0.4 Width: (cm) 0.4 Depth: (cm) 0.1 Jeanpaul,  [place]anna ([id number]) Area: (cm) 0.126 Volume: (cm) 0.013 % Reduction in Area: % Reduction in Volume: Epithelialization: Small (1-33%) [id number] Page 11 of 12 Tunneling: No Undermining: No Wound Description Classification: Grade 1 Wound Margin: Distinct, outline attached Exudate Amount: Medium Exudate Type: Serosanguineous Exudate Color: red, brown Foul Odor After Cleansing:  No Slough/Fibrino No Wound Bed Granulation Amount: Large (67-100%) Exposed Structure Granulation Quality: Red Fascia Exposed: No Necrotic Amount: None Present (0%) Fat Layer (Subcutaneous Tissue) Exposed: Yes Tendon Exposed: No Muscle Exposed: No Joint Exposed: No Bone Exposed: No Periwound Skin Texture Texture Color No Abnormalities Noted: No No Abnormalities Noted: No Callus: No Atrophie Blanche: No Crepitus: No Cyanosis: No Excoriation: No Ecchymosis: No Induration: No Erythema: No Rash: No Hemosiderin Staining: Yes Scarring: No Mottled: No Pallor: No Moisture Rubor: No No Abnormalities Noted: No Dry / Scaly: No Maceration: No Treatment Notes Wound #7 (Ankle) Wound Laterality: Right, Medial Cleanser Soap and Water Discharge Instruction: May shower and wash wound with dial antibacterial soap and water prior to dressing change. Vashe 5.8 (oz) Discharge Instruction: Cleanse the wound with Vashe prior to applying a clean dressing using gauze sponges, not tissue or cotton balls. Peri-Wound Care Sween Lotion (Moisturizing lotion) Discharge Instruction: Apply moisturizing lotion as directed Topical Primary Dressing Maxorb Extra Ag+ Alginate Dressing, 4x4.75 (in/in) Discharge Instruction: Apply to wound bed as instructed Secondary Dressing ABD Pad, 5x9 Discharge Instruction: Apply over primary dressing as directed. Secured With Compression Wrap CoFlex Calamine Unna Boot 4 x 6 (in/yd) Discharge Instruction: Apply Coflex Calamine [employer] as  directed. Compression Stockings Add-Ons Electronic Signature(s) Signed: 01/08/2023 4:08:13 PM By: Shawn Stall RN, BSN Signed: 01/09/2023 4:40:31 PM By: Thayer Dallas Entered By: Thayer Dallas on 01/08/2023 14:27:22 [name] ([id number]) [id number] Page 12 of 12 -------------------------------------------------------------------------------- Vitals Details Patient Name: Date of Service: [name], BAU 01/08/2023 2:00 PM Medical Record Number: [id number] Patient Account Number: [id number] Date of Birth/Sex: Treating RN: Apr 15, 1941 (82 y.o. F) Primary Care Kaidynce Pfister: Aliene Beams Other Clinician: Referring Wm Sahagun: Treating Harim Bi/Extender: Marguerite Olea in Treatment: 2 Vital Signs Time Taken: 14:21 Temperature (F): 98.1 Pulse (bpm): 74 Respiratory Rate (breaths/min): 20 Blood Pressure (mmHg): 129/61 Reference Range: 80 - 120 mg / dl Electronic Signature(s) Signed: 01/09/2023 4:40:31 PM By: Thayer Dallas Entered By: Thayer Dallas on 01/08/2023 14:22:38

## 2023-01-14 ENCOUNTER — Encounter (HOSPITAL_BASED_OUTPATIENT_CLINIC_OR_DEPARTMENT_OTHER): Payer: Medicare Other | Attending: Internal Medicine | Admitting: Internal Medicine

## 2023-01-14 DIAGNOSIS — I87311 Chronic venous hypertension (idiopathic) with ulcer of right lower extremity: Secondary | ICD-10-CM | POA: Insufficient documentation

## 2023-01-14 DIAGNOSIS — E11621 Type 2 diabetes mellitus with foot ulcer: Secondary | ICD-10-CM | POA: Diagnosis not present

## 2023-01-14 DIAGNOSIS — I890 Lymphedema, not elsewhere classified: Secondary | ICD-10-CM | POA: Insufficient documentation

## 2023-01-14 DIAGNOSIS — L97312 Non-pressure chronic ulcer of right ankle with fat layer exposed: Secondary | ICD-10-CM | POA: Diagnosis not present

## 2023-01-14 DIAGNOSIS — E1122 Type 2 diabetes mellitus with diabetic chronic kidney disease: Secondary | ICD-10-CM | POA: Insufficient documentation

## 2023-01-14 DIAGNOSIS — L97822 Non-pressure chronic ulcer of other part of left lower leg with fat layer exposed: Secondary | ICD-10-CM | POA: Insufficient documentation

## 2023-01-14 DIAGNOSIS — I5032 Chronic diastolic (congestive) heart failure: Secondary | ICD-10-CM | POA: Insufficient documentation

## 2023-01-14 DIAGNOSIS — L97522 Non-pressure chronic ulcer of other part of left foot with fat layer exposed: Secondary | ICD-10-CM | POA: Diagnosis not present

## 2023-01-14 DIAGNOSIS — N184 Chronic kidney disease, stage 4 (severe): Secondary | ICD-10-CM | POA: Diagnosis not present

## 2023-01-14 DIAGNOSIS — L97812 Non-pressure chronic ulcer of other part of right lower leg with fat layer exposed: Secondary | ICD-10-CM | POA: Insufficient documentation

## 2023-01-14 DIAGNOSIS — E11622 Type 2 diabetes mellitus with other skin ulcer: Secondary | ICD-10-CM | POA: Diagnosis not present

## 2023-01-14 DIAGNOSIS — I130 Hypertensive heart and chronic kidney disease with heart failure and stage 1 through stage 4 chronic kidney disease, or unspecified chronic kidney disease: Secondary | ICD-10-CM | POA: Insufficient documentation

## 2023-01-14 NOTE — Progress Notes (Signed)
[name], [name] ([id number]) [id number] Page 1 of 8 Visit Report for 01/14/2023 HPI Details Patient Name: Date of Service: [name], [name] 01/14/2023 3:30 PM Medical Record Number: [id number] Patient Account Number: [id number] Date of Birth/Sex: Treating RN: [birth year] (82 y.o. F) Primary Care Provider: Aliene Beams Other Clinician: Referring Provider: Treating Provider/Extender: Annitta Jersey in Treatment: 2 History of Present Illness HPI Description: ADMISSION 03/28/2020 This is a 82 year old woman who is accompanied by her daughter. She is moving to [place] from [place] where she lives. Infectious been going to the wound care center in [place] for the last 2 months. She apparently did not tolerate compression early on in that clinic stay. She is just using dry gauze over the wounds when she came in today. She tells me she has had both arterial and venous reflux studies although she does not have copies of these. She was apparently offered an ablation but I have no further information on that. She also is a type II diabetic. She was noted to have wounds on her lower extremities during a CHF clinic visit with Dr. Gala Romney and she was referred here. She has several scattered areas on the right medial lower leg and a large area of superficial ulceration on the posterior medial left lower leg. past medical history; chronic kidney disease stage III, gastroesophageal reflux disease, type 2 diabetes with neuropathy, chronic diastolic heart failure, obstructive sleep apnea, hypertension, restless leg syndrome, pulmonary venous hypertension, history of colon CA, pacemaker and apparently venous reflux. We did not do arterial studies in the clinic today we are going to try to get the results that were already done within the last 2 months in [place] 7/26; patient readmitted to the clinic last week with predominantly chronic venous wounds  almost circumferentially in the left lower leg and the right leg medially. She also has secondary lymphedema. We put her in compression. She went to the ER in [place] [employer] on Friday they remove the wrap on the left leg diagnosed her with cellulitis and put her on doxycycline. They apparently also did a ultrasound that was negative for DVT . We did not get any vascular information from [facility] where she apparently had arterial studies and venous studies. She is currently moving from [place] to [place] is up on her feet quite a bit. She was referred to vein and vascular here but never got to the appointment. ABIs were obtained here at 0.89 on the right and 1.02 on the left 8/9-Patient returns after establishing in clinic last visit, we have been doing 3 layer compression on both sides with calcium alginate, she is following up with the vein and vascular when she gets an appointment. The right side is healed the left side is still got some small open areas She has appt on Wed at Vein and vascular at which point she will come in for Nurse visit here 8/16; the patient arrives with all of her wounds closed. She has bilateral Juzo stockings. She saw Dr. Darrick Penna on 8/11 to review her reflux studies from the same day. On the right she had no evidence of DVT or SVT She did have deep venous reflux in the common femoral vein as well as superficial venous reflux in the . small saphenous vein of the mid calf on the left again no thrombus in the deep vein or superficial veins. Deep vein reflux in the common femoral vein as well as the great saphenous vein. She saw Dr. Darrick Penna in consult. Also  of note he noted that she had a normal arterial scan done in [place] in April 2021, we were never able to obtain this. His feeling was that the patient did have a dilated greater saphenous vein however minimal to no evidence of reflux in her superficial venous system. She also was felt to have mild deep vein reflux and  some degree of lymphedema. He recommended continued compression of the lower extremities with intermittent Unna boots if necessary. She was told to elevate her legs. If she develops recurrent ulcerations they were not opposed to revisiting the repeat venous reflux exam to see if she develops worsening reflux for consideration of laser ablation READMISSION 07/26/2020 Patient is now an 82 year old woman who lives in [place]. We had her in the clinic in the summer with wounds on her bilateral lower legs secondary to chronic venous insufficiency with some degree of lymphedema. She was seen by Dr. Darrick Penna in August. This was predominantly to review her venous reflux. She quoted normal arterial studies done in [place] in April 2021 but I have never been able to see these myself.Marland Kitchen His feeling was the patient did have a bilateral great dilated greater saphenous vein however minimal to no evidence of reflux in her superficial venous system. She was also felt to have mild deep vein reflux and some degree of lymphedema. He recommended compression and leg elevation.Marland Kitchen He was not opposed to repeating her venous reflux studies at some point if she develops worse to see if she develops worsening reflux for consideration of ablation. We discharged the patient with juxta lite stockings bilaterally and she has been compliant with wearing these. Unfortunately in September she had a fall suffering a right tibial fracture requiring an IM nail with a rod. She also had fibular fractures in at least one place. She was sent to skilled facility for rehabilitation she is now back at home. She had a DVT rule out study on 9/24 that was negative for DVT . She tells [place] that in the last 2 or 3 weeks she is developed blisters on her right lower extremity that ruptured into 2 open wounds. She has not been able to get these to close over. She religiously uses her juxta lite stockings on both legs and she gets them on is tight as she  can. Originally the left leg was the larger of the 2 legs but since the patient had surgery the right leg is more swollen and tense than the left. She also tells me she had a second fall yesterday and has had pain in the left dorsal midfoot. She has diabetic neuropathy with Charcot foot left greater than right. Past medical history includes chronic kidney disease, hypertension, type 2 diabetes with peripheral neuropathy, congestive heart failure, hypertension, history of colon CA, lumbar stenosis ABIs in our clinic were noncompressible bilaterally 11/30; the wounds in the right lower extremity are healed anteriorly. She has both Tubigrip and her juxta lite stockings she uses the Tubigrip as the primary layer. We went over this with her today she has the bilateral stockings. She will need to adjust her compression to roughly 30 mmHg 12/25/2022 [name] is a 82 year old female with a past medical history Of CKD stage IV, lymphedema/venous insufficiency, controlled type 2 diabetes that presents the clinic for a 11-month history of bilateral lower extremity wounds. She has difficulty controlling the edema and is currently on metolazone 2.5 mg once weekly and torsemide 40 mg daily. She sleeps in a recliner. She has  been using antibiotic ointment to the wound beds. She states she has Juxta lite compressions which she uses daily. She currently denies signs of infection. [name], [name] ([id number]) [id number] Page 2 of 8 4/23; patient presents for follow-up. We have been using Santyl and Hydrofera Blue under Kerlix/Coban to the lower extremities bilaterally. She tolerated this well. 4/30; patient presents for follow-up. We have been using silver alginate under 3 layer compression. Patient did not tolerate the wraps and took these off last week. She has significant swelling on exam. She does report that her cardiologist recommended increasing the diuretic regimen at one  point to help with her lower extremity edema however she did not do this. 5/6; we have been using silver alginate and Coflex Unna boot. She was able to tolerate this. She does not tolerate tight compression well. She has wounds on her bilateral lower extremities some of which are measuring smaller the area on the left medial is unchanged. The patient sleeps in her recliner tells me that she is not able to elevate her legs much because of musculoskeletal pain. She has a history of having external compression stockings but they are old and will probably need a new pair she asked [place] to order them for her room ALT I told her we do so when she is close to healing Electronic Signature(s) Signed: 01/14/2023 4:51:59 PM By: Baltazar Najjar MD Entered By: Baltazar Najjar on 01/14/2023 16:47:57 -------------------------------------------------------------------------------- Physical Exam Details Patient Name: Date of Service: [name], [name] 01/14/2023 3:30 PM Medical Record Number: [id number] Patient Account Number: [id number] Date of Birth/Sex: Treating RN: 07-30-1941 (82 y.o. F) Primary Care Provider: Aliene Beams Other Clinician: Referring Provider: Treating Provider/Extender: Annitta Jersey in Treatment: 2 Constitutional Patient is hypertensive.. Pulse regular and within target range for patient.Marland Kitchen Respirations regular, non-labored and within target range.. Temperature is normal and within the target range for the patient.Marland Kitchen Appears in no distress. Cardiovascular Peripheral pulses are palpable. Notes Wound exam; scattered areas on the left lower extremity predominantly around the lower leg and ankle area. Severe hemosiderin deposition. She has poor edema control which is concerning in terms of healing but no evidence of surrounding infection. Electronic Signature(s) Signed: 01/14/2023 4:51:59 PM By: Baltazar Najjar MD Entered By: Baltazar Najjar on 01/14/2023  16:49:06 -------------------------------------------------------------------------------- Physician Orders Details Patient Name: Date of Service: [name], [name] 01/14/2023 3:30 PM Medical Record Number: [id number] Patient Account Number: [id number] Date of Birth/Sex: Treating RN: Jul 21, 1941 (82 y.o. F) Primary Care Provider: Aliene Beams Other Clinician: Referring Provider: Treating Provider/Extender: Annitta Jersey in Treatment: 2 Verbal / Phone Orders: No Diagnosis Coding Follow-up Appointments ppointment in 1 week. - Dr Mikey Bussing - Room 9 Return A Other: - Speak with Cardiologist about fluid overfload Anesthetic Wound #5 Right,Anterior Lower Leg (In clinic) Topical Lidocaine 4% applied to wound bed Wound #6 Left,Dorsal Foot (In clinic) Topical Lidocaine 4% applied to wound bed Bathing/ Shower/ Hygiene May shower with protection but do not get wound dressing(s) wet. Protect dressing(s) with water repellant cover (for example, large plastic bag) or a cast cover and may then take shower. Edema Control - Lymphedema / SCD AUBREY, TEER ([id number]) [id number] Page 3 of 8 Bilateral Lower Extremities Elevate legs to the level of the heart or above for 30 minutes daily and/or when sitting for 3-4 times a day throughout the day. Avoid standing for long periods of time. Wound Treatment Wound #5 - Lower Leg Wound Laterality: Right, Anterior Cleanser: Soap and Water  1 x Per Week/30 Days Discharge Instructions: May shower and wash wound with dial antibacterial soap and water prior to dressing change. Cleanser: Vashe 5.8 (oz) 1 x Per Week/30 Days Discharge Instructions: Cleanse the wound with Vashe prior to applying a clean dressing using gauze sponges, not tissue or cotton balls. Peri-Wound Care: Sween Lotion (Moisturizing lotion) 1 x Per Week/30 Days Discharge Instructions: Apply moisturizing lotion as directed Prim Dressing:  Maxorb Extra Ag+ Alginate Dressing, 4x4.75 (in/in) 1 x Per Week/30 Days ary Discharge Instructions: Apply to wound bed as instructed Secondary Dressing: ABD Pad, 5x9 1 x Per Week/30 Days Discharge Instructions: Apply over primary dressing as directed. Compression Wrap: CoFlex Calamine Unna Boot 4 x 6 (in/yd) 1 x Per Week/30 Days Discharge Instructions: Apply Coflex Calamine [employer] as directed. Wound #6 - Foot Wound Laterality: Dorsal, Left Cleanser: Soap and Water 1 x Per Week/30 Days Discharge Instructions: May shower and wash wound with dial antibacterial soap and water prior to dressing change. Cleanser: Vashe 5.8 (oz) 1 x Per Week/30 Days Discharge Instructions: Cleanse the wound with Vashe prior to applying a clean dressing using gauze sponges, not tissue or cotton balls. Peri-Wound Care: Sween Lotion (Moisturizing lotion) 1 x Per Week/30 Days Discharge Instructions: Apply moisturizing lotion as directed Prim Dressing: Maxorb Extra Ag+ Alginate Dressing, 4x4.75 (in/in) 1 x Per Week/30 Days ary Discharge Instructions: Apply to wound bed as instructed Secondary Dressing: ABD Pad, 5x9 1 x Per Week/30 Days Discharge Instructions: Apply over primary dressing as directed. Compression Wrap: CoFlex Calamine Unna Boot 4 x 6 (in/yd) 1 x Per Week/30 Days Discharge Instructions: Apply Coflex Calamine [employer] as directed. Wound #7 - Ankle Wound Laterality: Right, Medial Cleanser: Soap and Water 1 x Per Week/30 Days Discharge Instructions: May shower and wash wound with dial antibacterial soap and water prior to dressing change. Cleanser: Vashe 5.8 (oz) 1 x Per Week/30 Days Discharge Instructions: Cleanse the wound with Vashe prior to applying a clean dressing using gauze sponges, not tissue or cotton balls. Peri-Wound Care: Sween Lotion (Moisturizing lotion) 1 x Per Week/30 Days Discharge Instructions: Apply moisturizing lotion as directed Prim Dressing: Maxorb Extra Ag+ Alginate Dressing,  4x4.75 (in/in) 1 x Per Week/30 Days ary Discharge Instructions: Apply to wound bed as instructed Secondary Dressing: ABD Pad, 5x9 1 x Per Week/30 Days Discharge Instructions: Apply over primary dressing as directed. Compression Wrap: CoFlex Calamine Unna Boot 4 x 6 (in/yd) 1 x Per Week/30 Days Discharge Instructions: Apply Coflex Calamine [employer] as directed. Electronic Signature(s) Signed: 01/14/2023 4:50:46 PM By: Thayer Dallas Signed: 01/14/2023 4:51:59 PM By: Baltazar Najjar MD Entered By: Thayer Dallas on 01/14/2023 16:22:23 -------------------------------------------------------------------------------- Problem List Details Patient Name: Date of Service: ALILAH, CORONADO 01/14/2023 3:30 PM Medical Record Number: [id number] Patient Account Number: [id number] [name], [name] ([id number]) [id number] Page 4 of 8 Date of Birth/Sex: Treating RN: October 19, 1940 (82 y.o. F) Primary Care Provider: Other Clinician: Aliene Beams Referring Provider: Treating Provider/Extender: Annitta Jersey in Treatment: 2 Active Problems ICD-10 Encounter Code Description Active Date MDM Diagnosis L97.812 Non-pressure chronic ulcer of other part of right lower leg with fat layer 12/25/2022 No Yes exposed L97.822 Non-pressure chronic ulcer of other part of left lower leg with fat layer exposed4/16/2024 No Yes I87.311 Chronic venous hypertension (idiopathic) with ulcer of right lower extremity 12/25/2022 No Yes E11.622 Type 2 diabetes mellitus with other skin ulcer 12/25/2022 No Yes I89.0 Lymphedema, not elsewhere classified 12/25/2022 No Yes Inactive Problems Resolved Problems Electronic Signature(s) Signed:  01/14/2023 4:51:59 PM By: Baltazar Najjar MD Entered By: Baltazar Najjar on 01/14/2023 16:44:43 -------------------------------------------------------------------------------- Progress Note Details Patient Name: Date of Service: [name], [name] 01/14/2023  3:30 PM Medical Record Number: [id number] Patient Account Number: [id number] Date of Birth/Sex: Treating RN: 1941-05-17 (82 y.o. F) Primary Care Provider: Aliene Beams Other Clinician: Referring Provider: Treating Provider/Extender: Annitta Jersey in Treatment: 2 Subjective History of Present Illness (HPI) ADMISSION 03/28/2020 This is a 82 year old woman who is accompanied by her daughter. She is moving to [place] from [place] where she lives. Infectious been going to the wound care center in [place] for the last 2 months. She apparently did not tolerate compression early on in that clinic stay. She is just using dry gauze over the wounds when she came in today. She tells me she has had both arterial and venous reflux studies although she does not have copies of these. She was apparently offered an ablation but I have no further information on that. She also is a type II diabetic. She was noted to have wounds on her lower extremities during a CHF clinic visit with Dr. Gala Romney and she was referred here. She has several scattered areas on the right medial lower leg and a large area of superficial ulceration on the posterior medial left lower leg. past medical history; chronic kidney disease stage III, gastroesophageal reflux disease, type 2 diabetes with neuropathy, chronic diastolic heart failure, obstructive sleep apnea, hypertension, restless leg syndrome, pulmonary venous hypertension, history of colon CA, pacemaker and apparently venous reflux. We did not do arterial studies in the clinic today we are going to try to get the results that were already done within the last 2 months in [place] 7/26; patient readmitted to the clinic last week with predominantly chronic venous wounds almost circumferentially in the left lower leg and the right leg medially. She also has secondary lymphedema. We put her in compression. She went to the ER in [place] on  Friday they remove the wrap on the left leg diagnosed her with cellulitis and put her on doxycycline. They apparently also did a ultrasound that was negative for DVT . We did not get any vascular information from [facility] where she apparently had arterial studies and venous studies. She is currently moving from [place], [place] ([id number]) [id number] Page 5 of 8 [place] to [place] is up on her feet quite a bit. She was referred to vein and vascular here but never got to the appointment. ABIs were obtained here at 0.89 on the right and 1.02 on the left 8/9-Patient returns after establishing in clinic last visit, we have been doing 3 layer compression on both sides with calcium alginate, she is following up with the vein and vascular when she gets an appointment. The right side is healed the left side is still got some small open areas She has appt on Wed at Vein and vascular at which point she will come in for Nurse visit here 8/16; the patient arrives with all of her wounds closed. She has bilateral Juzo stockings. She saw Dr. Darrick Penna on 8/11 to review her reflux studies from the same day. On the right she had no evidence of DVT or SVT She did have deep venous reflux in the common femoral vein as well as superficial venous reflux in the . small saphenous vein of the mid calf on the left again no thrombus in the deep vein or superficial veins. Deep vein reflux in the common femoral vein as  well as the great saphenous vein. She saw Dr. Darrick Penna in consult. Also of note he noted that she had a normal arterial scan done in [place] in April 2021, we were never able to obtain this. His feeling was that the patient did have a dilated greater saphenous vein however minimal to no evidence of reflux in her superficial venous system. She also was felt to have mild deep vein reflux and some degree of lymphedema. He recommended continued compression of the lower extremities  with intermittent Unna boots if necessary. She was told to elevate her legs. If she develops recurrent ulcerations they were not opposed to revisiting the repeat venous reflux exam to see if she develops worsening reflux for consideration of laser ablation READMISSION 07/26/2020 Patient is now an 82 year old woman who lives in [place]. We had her in the clinic in the summer with wounds on her bilateral lower legs secondary to chronic venous insufficiency with some degree of lymphedema. She was seen by Dr. Darrick Penna in August. This was predominantly to review her venous reflux. She quoted normal arterial studies done in [place] in April 2021 but I have never been able to see these myself.Marland Kitchen His feeling was the patient did have a bilateral great dilated greater saphenous vein however minimal to no evidence of reflux in her superficial venous system. She was also felt to have mild deep vein reflux and some degree of lymphedema. He recommended compression and leg elevation.Marland Kitchen He was not opposed to repeating her venous reflux studies at some point if she develops worse to see if she develops worsening reflux for consideration of ablation. We discharged the patient with juxta lite stockings bilaterally and she has been compliant with wearing these. Unfortunately in September she had a fall suffering a right tibial fracture requiring an IM nail with a rod. She also had fibular fractures in at least one place. She was sent to skilled facility for rehabilitation she is now back at home. She had a DVT rule out study on 9/24 that was negative for DVT . She tells [place] that in the last 2 or 3 weeks she is developed blisters on her right lower extremity that ruptured into 2 open wounds. She has not been able to get these to close over. She religiously uses her juxta lite stockings on both legs and she gets them on is tight as she can. Originally the left leg was the larger of the 2 legs but since the patient had  surgery the right leg is more swollen and tense than the left. She also tells me she had a second fall yesterday and has had pain in the left dorsal midfoot. She has diabetic neuropathy with Charcot foot left greater than right. Past medical history includes chronic kidney disease, hypertension, type 2 diabetes with peripheral neuropathy, congestive heart failure, hypertension, history of colon CA, lumbar stenosis ABIs in our clinic were noncompressible bilaterally 11/30; the wounds in the right lower extremity are healed anteriorly. She has both Tubigrip and her juxta lite stockings she uses the Tubigrip as the primary layer. We went over this with her today she has the bilateral stockings. She will need to adjust her compression to roughly 30 mmHg 12/25/2022 [name] is a 82 year old female with a past medical history Of CKD stage IV, lymphedema/venous insufficiency, controlled type 2 diabetes that presents the clinic for a 1oo50-month history of bilateral lower extremity wounds. She has difficulty controlling the edema and is currently on metolazone 2.5 mg once  weekly and torsemide 40 mg daily. She sleeps in a recliner. She has been using antibiotic ointment to the wound beds. She states she has Juxta lite compressions which she uses daily. She currently denies signs of infection. 4/23; patient presents for follow-up. We have been using Santyl and Hydrofera Blue under Kerlix/Coban to the lower extremities bilaterally. She tolerated this well. 4/30; patient presents for follow-up. We have been using silver alginate under 3 layer compression. Patient did not tolerate the wraps and took these off last week. She has significant swelling on exam. She does report that her cardiologist recommended increasing the diuretic regimen at one point to help with her lower extremity edema however she did not do this. 5/6; we have been using silver alginate and Coflex Unna boot. She was able to tolerate  this. She does not tolerate tight compression well. She has wounds on her bilateral lower extremities some of which are measuring smaller the area on the left medial is unchanged. The patient sleeps in her recliner tells me that she is not able to elevate her legs much because of musculoskeletal pain. She has a history of having external compression stockings but they are old and will probably need a new pair she asked [place] to order them for her room ALT I told her we do so when she is close to healing Objective Constitutional Patient is hypertensive.. Pulse regular and within target range for patient.Marland Kitchen Respirations regular, non-labored and within target range.. Temperature is normal and within the target range for the patient.Marland Kitchen Appears in no distress. Vitals Time Taken: 3:52 PM, Temperature: 98.1 F, Pulse: 64 bpm, Respiratory Rate: 18 breaths/min, Blood Pressure: 163/73 mmHg. Cardiovascular Peripheral pulses are palpable. General Notes: Wound exam; scattered areas on the left lower extremity predominantly around the lower leg and ankle area. Severe hemosiderin deposition. She has poor edema control which is concerning in terms of healing but no evidence of surrounding infection. Integumentary (Hair, Skin) Wound #5 status is Open. Original cause of wound was Gradually Appeared. The date acquired was: 11/22/2022. The wound has been in treatment 2 weeks. The wound is located on the Right,Anterior Lower Leg. The wound measures 1.4cm length x 2cm width x 0.1cm depth; 2.199cm^2 area and 0.22cm^3 volume. There is Fat Layer (Subcutaneous Tissue) exposed. There is a medium amount of serosanguineous drainage noted. The wound margin is distinct with the outline ISELA, TIET ([id number]) [id number] Page 6 of 8 attached to the wound base. There is large (67-100%) red, pink granulation within the wound bed. There is a small (1-33%) amount of necrotic tissue within the wound bed  including Adherent Slough. The periwound skin appearance exhibited: Hemosiderin Staining. The periwound skin appearance did not exhibit: Callus, Crepitus, Excoriation, Induration, Rash, Scarring, Dry/Scaly, Maceration, Atrophie Blanche, Cyanosis, Ecchymosis, Mottled, Pallor, Rubor, Erythema. Wound #6 status is Open. Original cause of wound was Blister. The date acquired was: 12/10/2022. The wound has been in treatment 2 weeks. The wound is located on the Left,Dorsal Foot. The wound measures 0.2cm length x 0.2cm width x 0.1cm depth; 0.031cm^2 area and 0.003cm^3 volume. There is Fat Layer (Subcutaneous Tissue) exposed. There is a medium amount of serosanguineous drainage noted. The wound margin is distinct with the outline attached to the wound base. There is small (1-33%) red granulation within the wound bed. There is a large (67-100%) amount of necrotic tissue within the wound bed including Adherent Slough. The periwound skin appearance did not exhibit: Callus, Crepitus, Excoriation, Induration, Rash, Scarring, Dry/Scaly, Maceration, Atrophie Blanche,  Cyanosis, Ecchymosis, Hemosiderin Staining, Mottled, Pallor, Rubor, Erythema. Wound #7 status is Open. Original cause of wound was Gradually Appeared. The date acquired was: 01/08/2023. The wound is located on the Right,Medial Ankle. The wound measures 0.4cm length x 0.4cm width x 0.1cm depth; 0.126cm^2 area and 0.013cm^3 volume. There is Fat Layer (Subcutaneous Tissue) exposed. There is a medium amount of serosanguineous drainage noted. The wound margin is distinct with the outline attached to the wound base. There is large (67- 100%) red granulation within the wound bed. There is no necrotic tissue within the wound bed. The periwound skin appearance exhibited: Hemosiderin Staining. The periwound skin appearance did not exhibit: Callus, Crepitus, Excoriation, Induration, Rash, Scarring, Dry/Scaly, Maceration, Atrophie Blanche, Cyanosis, Ecchymosis, Mottled,  Pallor, Rubor, Erythema. Wound #8 status is Open. Original cause of wound was Gradually Appeared. The date acquired was: 01/07/2023. The wound is located on the Left,Medial Lower Leg. The wound measures 1.2cm length x 1.2cm width x 0.1cm depth; 1.131cm^2 area and 0.113cm^3 volume. There is Fat Layer (Subcutaneous Tissue) exposed. There is no tunneling or undermining noted. There is a medium amount of serosanguineous drainage noted. The wound margin is distinct with the outline attached to the wound base. There is medium (34-66%) red, pink granulation within the wound bed. There is a medium (34-66%) amount of necrotic tissue within the wound bed including Adherent Slough. The periwound skin appearance exhibited: Scarring, Cyanosis, Hemosiderin Staining. The periwound skin appearance did not exhibit: Dry/Scaly, Maceration. Periwound temperature was noted as No Abnormality. Assessment Active Problems ICD-10 Non-pressure chronic ulcer of other part of right lower leg with fat layer exposed Non-pressure chronic ulcer of other part of left lower leg with fat layer exposed Chronic venous hypertension (idiopathic) with ulcer of right lower extremity Type 2 diabetes mellitus with other skin ulcer Lymphedema, not elsewhere classified Procedures Wound #5 Pre-procedure diagnosis of Wound #5 is a Lymphedema located on the Right,Anterior Lower Leg . There was a Four Layer Compression Therapy Procedure Post procedure Diagnosis Wound #5: Same as Pre-Procedure Wound #6 Pre-procedure diagnosis of Wound #6 is a Diabetic Wound/Ulcer of the Lower Extremity located on the Left,Dorsal Foot . There was a Four Layer Compression Therapy Procedure Post procedure Diagnosis Wound #6: Same as Pre-Procedure Wound #7 Pre-procedure diagnosis of Wound #7 is a Diabetic Wound/Ulcer of the Lower Extremity located on the Right,Medial Ankle . There was a Four Layer Compression Therapy Procedure Post procedure Diagnosis Wound #7:  Same as Pre-Procedure Wound #8 Pre-procedure diagnosis of Wound #8 is a Lymphedema located on the Left,Medial Lower Leg . There was a Four Layer Compression Therapy Procedure Post procedure Diagnosis Wound #8: Same as Pre-Procedure Plan Follow-up Appointments: Return Appointment in 1 week. - Dr Mikey Bussing - Room 9 Other: - Speak with Cardiologist about fluid overfload Anesthetic: Wound #5 Right,Anterior Lower Leg: (In clinic) Topical Lidocaine 4% applied to wound bed Wound #6 Left,Dorsal Foot: (In clinic) Topical Lidocaine 4% applied to wound bed Bathing/ Shower/ Hygiene: May shower with protection but do not get wound dressing(s) wet. Protect dressing(s) with water repellant cover (for example, large plastic bag) or a cast cover and may then take shower. Edema Control - Lymphedema / SCD / Other: Elevate legs to the level of the heart or above for 30 minutes daily and/or when sitting for 3-4 times a day throughout the day. Avoid standing for long periods of time. [name], [name] ([id number]) [id number] Page 7 of 8 WOUND #5: - Lower Leg Wound Laterality: Right, Anterior Cleanser: Soap and Water  1 x Per Week/30 Days Discharge Instructions: May shower and wash wound with dial antibacterial soap and water prior to dressing change. Cleanser: Vashe 5.8 (oz) 1 x Per Week/30 Days Discharge Instructions: Cleanse the wound with Vashe prior to applying a clean dressing using gauze sponges, not tissue or cotton balls. Peri-Wound Care: Sween Lotion (Moisturizing lotion) 1 x Per Week/30 Days Discharge Instructions: Apply moisturizing lotion as directed Prim Dressing: Maxorb Extra Ag+ Alginate Dressing, 4x4.75 (in/in) 1 x Per Week/30 Days ary Discharge Instructions: Apply to wound bed as instructed Secondary Dressing: ABD Pad, 5x9 1 x Per Week/30 Days Discharge Instructions: Apply over primary dressing as directed. Com pression Wrap: CoFlex Calamine Unna Boot 4 x 6 (in/yd) 1  x Per Week/30 Days Discharge Instructions: Apply Coflex Calamine [employer] as directed. WOUND #6: - Foot Wound Laterality: Dorsal, Left Cleanser: Soap and Water 1 x Per Week/30 Days Discharge Instructions: May shower and wash wound with dial antibacterial soap and water prior to dressing change. Cleanser: Vashe 5.8 (oz) 1 x Per Week/30 Days Discharge Instructions: Cleanse the wound with Vashe prior to applying a clean dressing using gauze sponges, not tissue or cotton balls. Peri-Wound Care: Sween Lotion (Moisturizing lotion) 1 x Per Week/30 Days Discharge Instructions: Apply moisturizing lotion as directed Prim Dressing: Maxorb Extra Ag+ Alginate Dressing, 4x4.75 (in/in) 1 x Per Week/30 Days ary Discharge Instructions: Apply to wound bed as instructed Secondary Dressing: ABD Pad, 5x9 1 x Per Week/30 Days Discharge Instructions: Apply over primary dressing as directed. Com pression Wrap: CoFlex Calamine Unna Boot 4 x 6 (in/yd) 1 x Per Week/30 Days Discharge Instructions: Apply Coflex Calamine [employer] as directed. WOUND #7: - Ankle Wound Laterality: Right, Medial Cleanser: Soap and Water 1 x Per Week/30 Days Discharge Instructions: May shower and wash wound with dial antibacterial soap and water prior to dressing change. Cleanser: Vashe 5.8 (oz) 1 x Per Week/30 Days Discharge Instructions: Cleanse the wound with Vashe prior to applying a clean dressing using gauze sponges, not tissue or cotton balls. Peri-Wound Care: Sween Lotion (Moisturizing lotion) 1 x Per Week/30 Days Discharge Instructions: Apply moisturizing lotion as directed Prim Dressing: Maxorb Extra Ag+ Alginate Dressing, 4x4.75 (in/in) 1 x Per Week/30 Days ary Discharge Instructions: Apply to wound bed as instructed Secondary Dressing: ABD Pad, 5x9 1 x Per Week/30 Days Discharge Instructions: Apply over primary dressing as directed. Com pression Wrap: CoFlex Calamine Unna Boot 4 x 6 (in/yd) 1 x Per Week/30 Days Discharge  Instructions: Apply Coflex Calamine [employer] as directed. 1. I did not change the primary dressing which is Hydrofera Blue nor the wrap. 2. I told her that if the wounds worsen or do not progress towards healing she is going to need tighter and ongoing compression perhaps 3 layer although her tolerance of these type of wraps is not good 3. I have asked her to try and keep her legs elevated although she has musculoskeletal pain which makes this difficult Electronic Signature(s) Signed: 01/14/2023 4:51:59 PM By: Baltazar Najjar MD Entered By: Baltazar Najjar on 01/14/2023 16:50:07 -------------------------------------------------------------------------------- SuperBill Details Patient Name: Date of Service: [name], [name] 01/14/2023 Medical Record Number: [id number] Patient Account Number: [id number] Date of Birth/Sex: Treating RN: February 13, 1941 (82 y.o. F) Primary Care Provider: Aliene Beams Other Clinician: Referring Provider: Treating Provider/Extender: Annitta Jersey in Treatment: 2 Diagnosis Coding ICD-10 Codes Code Description [phone] Non-pressure chronic ulcer of other part of right lower leg with fat layer exposed L97.822 Non-pressure chronic ulcer of other  part of left lower leg with fat layer exposed I87.311 Chronic venous hypertension (idiopathic) with ulcer of right lower extremity E11.622 Type 2 diabetes mellitus with other skin ulcer I89.0 Lymphedema, not elsewhere classified Facility Procedures : AMARAE, BILLMAN Code: [id number] UDITH ([id number] Description: (Facility Use Only) [phone] - APPLY MULTLAY COMPRS LWR LT LEG ICD-10 Diagnosis Description L97.822 Non-pressure chronic ulcer of other part of left lower leg with fat layer exposed ) [phone] Modifier: [id number] Quantity: 1 .pdf Page 8 of 8 : 3 CPT4 Code: [id number] (F I Description: acility Use Only) [id number] - APPLY MULTLAY COMPRS LWR RT LEG CD-10 Diagnosis Description L97.812  Non-pressure chronic ulcer of other part of right lower leg with fat layer exposed Modifier: 1 Quantity: Physician Procedures : CPT4 Code Description Modifier [id number] - WC PHYS LEVEL 3 - EST PT ICD-10 Diagnosis Description L97.812 Non-pressure chronic ulcer of other part of right lower leg with fat layer exposed L97.822 Non-pressure chronic ulcer of other part of left  lower leg with fat layer exposed I87.311 Chronic venous hypertension (idiopathic) with ulcer of right lower extremity Quantity: 1 Electronic Signature(s) Signed: 01/14/2023 4:51:59 PM By: Baltazar Najjar MD Entered By: Baltazar Najjar on 01/14/2023 16:50:26

## 2023-01-15 ENCOUNTER — Encounter (HOSPITAL_BASED_OUTPATIENT_CLINIC_OR_DEPARTMENT_OTHER): Payer: Medicare Other | Admitting: Internal Medicine

## 2023-01-15 DIAGNOSIS — E1122 Type 2 diabetes mellitus with diabetic chronic kidney disease: Secondary | ICD-10-CM | POA: Diagnosis not present

## 2023-01-15 DIAGNOSIS — I5032 Chronic diastolic (congestive) heart failure: Secondary | ICD-10-CM | POA: Diagnosis not present

## 2023-01-15 DIAGNOSIS — I130 Hypertensive heart and chronic kidney disease with heart failure and stage 1 through stage 4 chronic kidney disease, or unspecified chronic kidney disease: Secondary | ICD-10-CM | POA: Diagnosis not present

## 2023-01-15 DIAGNOSIS — L97812 Non-pressure chronic ulcer of other part of right lower leg with fat layer exposed: Secondary | ICD-10-CM | POA: Diagnosis not present

## 2023-01-15 DIAGNOSIS — L97822 Non-pressure chronic ulcer of other part of left lower leg with fat layer exposed: Secondary | ICD-10-CM | POA: Diagnosis not present

## 2023-01-15 DIAGNOSIS — I87311 Chronic venous hypertension (idiopathic) with ulcer of right lower extremity: Secondary | ICD-10-CM | POA: Diagnosis not present

## 2023-01-15 DIAGNOSIS — I890 Lymphedema, not elsewhere classified: Secondary | ICD-10-CM | POA: Diagnosis not present

## 2023-01-15 DIAGNOSIS — N184 Chronic kidney disease, stage 4 (severe): Secondary | ICD-10-CM | POA: Diagnosis not present

## 2023-01-15 DIAGNOSIS — E11622 Type 2 diabetes mellitus with other skin ulcer: Secondary | ICD-10-CM | POA: Diagnosis not present

## 2023-01-15 NOTE — Progress Notes (Signed)
MIANA, LENSCH ([id number]) [id number] Page 1 of 7 Visit Report for 01/15/2023 Arrival Information Details Patient Name: Date of Service: [name], [name] 01/15/2023 3:15 PM Medical Record Number: [id number] Patient Account Number: [id number] Date of Birth/Sex: Treating RN: 03/20/1941 (82 y.o. [name], [name] Primary Care [name]: [name] Other Clinician: Referring [name]: Treating [name]/Extender: [name] in Treatment: 3 Visit Information History Since Last Visit Added or deleted any medications: No Patient Arrived: [name] Any new allergies or adverse reactions: No Arrival Time: 15:48 Had a fall or experienced change in No Accompanied By: self activities of daily living that may affect Transfer Assistance: None risk of falls: Patient Identification Verified: Yes Signs or symptoms of abuse/neglect since last visito No Secondary Verification Process Completed: Yes Hospitalized since last visit: No Patient Requires Transmission-Based Precautions: No Implantable device outside of the clinic excluding No Patient Has Alerts: No cellular tissue based products placed in the center since last visit: Has Dressing in Place as Prescribed: No Has Compression in Place as Prescribed: No Pain Present Now: No Notes per patient itching to both legs and remove calamine compression wraps. per Dr. Leanord Hawking to add TCA and do 3 layer compression with kerlix and instead of cotton. Electronic Signature(s) Signed: 01/15/2023 4:32:52 PM By: [name] Stall RN, BSN Entered By: [name] on 01/15/2023 15:49:58 -------------------------------------------------------------------------------- Compression Therapy Details Patient Name: Date of Service: [name], [name] 01/15/2023 3:15 PM Medical Record Number: [id number] Patient Account Number: [id number] Date of Birth/Sex: Treating RN: [birth year] (82 y.o. [name] Primary Care [name]:  [name] Other Clinician: Referring [name]: Treating [name]/Extender: [name] in Treatment: 3 Compression Therapy Performed for Wound Assessment: Wound #5 Right,Anterior Lower Leg Performed By: Clinician [name] Stall, RN Compression Type: Three Emergency planning/management officer) Signed: 01/15/2023 4:32:52 PM By: [name] Stall RN, BSN Entered By: [name] on 01/15/2023 15:51:14 -------------------------------------------------------------------------------- Compression Therapy Details Patient Name: Date of Service: [name], [name] 01/15/2023 3:15 PM Medical Record Number: [id number] Patient Account Number: [id number] Date of Birth/Sex: Treating RN: 1941/06/10 (82 y.o. [name] Primary Care Yatzari Jonsson: [name] Other Clinician: Referring Tris Howell: Treating [name]/Extender: [name] in Treatment: 3 Compression Therapy Performed for Wound Assessment: Wound #8 Left,Medial Lower Leg Performed By: Clinician [name] Stall, RN Compression Type: Three Layer Electronic Signature(s) [name], [name] ([id number]) [id number] Page 2 of 7 Signed: 01/15/2023 4:32:52 PM By: [name] Stall RN, BSN Entered By: [name] on 01/15/2023 15:51:14 -------------------------------------------------------------------------------- Compression Therapy Details Patient Name: Date of Service: [name], [name] 01/15/2023 3:15 PM Medical Record Number: [id number] Patient Account Number: [id number] Date of Birth/Sex: Treating RN: Jan 13, 1941 (82 y.o. [name] Primary Care Margeret Stachnik: [name] Other Clinician: Referring [name]: Treating [name]/Extender: [name] in Treatment: 3 Compression Therapy Performed for Wound Assessment: Wound #7 Right,Medial Ankle Performed By: Clinician [name] Stall, RN Compression Type: Three Emergency planning/management officer) Signed: 01/15/2023  4:32:52 PM By: [name] Stall RN, BSN Entered By: [name] on 01/15/2023 15:51:14 -------------------------------------------------------------------------------- Compression Therapy Details Patient Name: Date of Service: [name], [name] 01/15/2023 3:15 PM Medical Record Number: [id number] Patient Account Number: [id number] Date of Birth/Sex: Treating RN: 1941-03-11 (82 y.o. [name] Primary Care [name]: [name] Other Clinician: Referring [name]: Treating [name]/Extender: [name] in Treatment: 3 Compression Therapy Performed for Wound Assessment: Wound #6 Left,Dorsal Foot Performed By: Clinician [name] Stall, RN Compression Type: Three Emergency planning/management officer) Signed: 01/15/2023 4:32:52 PM By: [name] Stall RN, BSN Entered By:  [name] on 01/15/2023 15:51:14 -------------------------------------------------------------------------------- Encounter Discharge Information Details Patient Name: Date of Service: [name], [name] 01/15/2023 3:15 PM Medical Record Number: [id number] Patient Account Number: [id number] Date of Birth/Sex: Treating RN: 08-01-1941 (82 y.o. [name] Primary Care [name]: [name] Other Clinician: Referring [name]: Treating [name]/Extender: [name] in Treatment: 3 Encounter Discharge Information Items Discharge Condition: Stable Ambulatory Status: Cane Discharge Destination: Home Transportation: Private Auto Accompanied By: self Schedule Follow-up Appointment: Yes Clinical Summary of Care: Electronic Signature(s) Signed: 01/15/2023 4:32:52 PM By: [name] Stall RN, BSN Entered By: [name] on 01/15/2023 15:53:16 -------------------------------------------------------------------------------- Wound Assessment Details Patient Name: Date of Service: [name], [name] 01/15/2023 3:15 PM [name] ([id number]) [id number] Page  3 of 7 Medical Record Number: [id number] Patient Account Number: [id number] Date of Birth/Sex: Treating RN: 31-May-1941 (82 y.o. [name], [name] Primary Care [name] Ratterree: [name] Other Clinician: Referring Kahley Leib: Treating Jmari Pelc/Extender: [name] in Treatment: 3 Wound Status Wound Number: 5 Primary Etiology: Lymphedema Wound Location: Right, Anterior Lower Leg Wound Status: Open Wounding Event: Gradually Appeared Date Acquired: 11/22/2022 Weeks Of Treatment: 3 Clustered Wound: No Wound Measurements Length: (cm) 1.4 Width: (cm) 2 Depth: (cm) 0.1 Area: (cm) 2.199 Volume: (cm) 0.22 % Reduction in Area: -600.3% % Reduction in Volume: -249.2% Wound Description Classification: Full Thickness Without Exposed Suppor Exudate Amount: Medium Exudate Type: Serosanguineous Exudate Color: red, brown t Structures Periwound Skin Texture Texture Color No Abnormalities Noted: No No Abnormalities Noted: No Moisture No Abnormalities Noted: No Treatment Notes Wound #5 (Lower Leg) Wound Laterality: Right, Anterior Cleanser Soap and Water Discharge Instruction: May shower and wash wound with dial antibacterial soap and water prior to dressing change. Vashe 5.8 (oz) Discharge Instruction: Cleanse the wound with Vashe prior to applying a clean dressing using gauze sponges, not tissue or cotton balls. Peri-Wound Care Triamcinolone 15 (g) Discharge Instruction: Use triamcinolone 15 (g) as directed Sween Lotion (Moisturizing lotion) Discharge Instruction: Apply moisturizing lotion as directed Topical Primary Dressing Maxorb Extra Ag+ Alginate Dressing, 4x4.75 (in/in) Discharge Instruction: Apply to wound bed as instructed Secondary Dressing ABD Pad, 5x9 Discharge Instruction: Apply over primary dressing as directed. Secured With Compression Wrap ThreePress (3 layer compression wrap) Discharge Instruction: Apply three layer compression as  directed. Compression Stockings Add-Ons Electronic Signature(s) Signed: 01/15/2023 4:32:52 PM By: [name] Stall RN, BSN Entered By: [name] on 01/15/2023 15:51:03 [name] ([id number]) [id number] Page 4 of 7 -------------------------------------------------------------------------------- Wound Assessment Details Patient Name: Date of Service: [name], [name] 01/15/2023 3:15 PM Medical Record Number: [id number] Patient Account Number: [id number] Date of Birth/Sex: Treating RN: [birth year] (82 y.o. [name], [name] Primary Care Demarco Bacci: [name] Other Clinician: Referring Renada Cronin: Treating Mindi Akerson/Extender: [name] in Treatment: 3 Wound Status Wound Number: 6 Primary Etiology: Diabetic Wound/Ulcer of the Lower Extremity Wound Location: Left, Dorsal Foot Wound Status: Open Wounding Event: Blister Date Acquired: 12/10/2022 Weeks Of Treatment: 3 Clustered Wound: No Wound Measurements Length: (cm) 0.2 Width: (cm) 0.2 Depth: (cm) 0.1 Area: (cm) 0.031 Volume: (cm) 0.003 % Reduction in Area: 75.4% % Reduction in Volume: 76.9% Wound Description Classification: Grade 1 Exudate Amount: Medium Exudate Type: Serosanguineous Exudate Color: red, brown Periwound Skin Texture Texture Color No Abnormalities Noted: No No Abnormalities Noted: No Moisture No Abnormalities Noted: No Treatment Notes Wound #6 (Foot) Wound Laterality: Dorsal, Left Cleanser Soap and Water Discharge Instruction: May shower and wash wound with dial antibacterial soap and water prior to dressing change. Vashe 5.8 (oz) Discharge  Instruction: Cleanse the wound with Vashe prior to applying a clean dressing using gauze sponges, not tissue or cotton balls. Peri-Wound Care Triamcinolone 15 (g) Discharge Instruction: Use triamcinolone 15 (g) as directed Sween Lotion (Moisturizing lotion) Discharge Instruction: Apply moisturizing lotion as  directed Topical Primary Dressing Maxorb Extra Ag+ Alginate Dressing, 4x4.75 (in/in) Discharge Instruction: Apply to wound bed as instructed Secondary Dressing ABD Pad, 5x9 Discharge Instruction: Apply over primary dressing as directed. Secured With Compression Wrap ThreePress (3 layer compression wrap) Discharge Instruction: Apply three layer compression as directed. Compression Stockings CIANNAH, TENISON ([id number]) [id number] Page 5 of 7 Add-Ons Electronic Signature(s) Signed: 01/15/2023 4:32:52 PM By: [name] Stall RN, BSN Entered By: [name] on 01/15/2023 15:51:03 -------------------------------------------------------------------------------- Wound Assessment Details Patient Name: Date of Service: [name], [name] 01/15/2023 3:15 PM Medical Record Number: [id number] Patient Account Number: [id number] Date of Birth/Sex: Treating RN: 01-01-1941 (82 y.o. [name], [name] Primary Care Gabbi Whetstone: [name] Other Clinician: Referring [name] [name]: Treating Esco Joslyn/Extender: [name] in Treatment: 3 Wound Status Wound Number: 7 Primary Etiology: Diabetic Wound/Ulcer of the Lower Extremity Wound Location: Right, Medial Ankle Secondary Etiology: Lymphedema Wounding Event: Gradually Appeared Wound Status: Open Date Acquired: 01/08/2023 Weeks Of Treatment: 1 Clustered Wound: No Wound Measurements Length: (cm) 0.4 Width: (cm) 0.4 Depth: (cm) 0.1 Area: (cm) 0.126 Volume: (cm) 0.013 % Reduction in Area: 0% % Reduction in Volume: 0% Wound Description Classification: Grade 1 Exudate Amount: Medium Exudate Type: Serosanguineous Exudate Color: red, brown Periwound Skin Texture Texture Color No Abnormalities Noted: No No Abnormalities Noted: No Moisture No Abnormalities Noted: No Treatment Notes Wound #7 (Ankle) Wound Laterality: Right, Medial Cleanser Soap and Water Discharge Instruction: May shower and wash  wound with dial antibacterial soap and water prior to dressing change. Vashe 5.8 (oz) Discharge Instruction: Cleanse the wound with Vashe prior to applying a clean dressing using gauze sponges, not tissue or cotton balls. Peri-Wound Care Triamcinolone 15 (g) Discharge Instruction: Use triamcinolone 15 (g) as directed Sween Lotion (Moisturizing lotion) Discharge Instruction: Apply moisturizing lotion as directed Topical Primary Dressing Maxorb Extra Ag+ Alginate Dressing, 4x4.75 (in/in) Discharge Instruction: Apply to wound bed as instructed Secondary Dressing ABD Pad, 5x9 Discharge Instruction: Apply over primary dressing as directed. Secured With Kieler, Bosie Clos ([id number]) [id number] Page 6 of 7 Compression Wrap ThreePress (3 layer compression wrap) Discharge Instruction: Apply three layer compression as directed. Compression Stockings Add-Ons Electronic Signature(s) Signed: 01/15/2023 4:32:52 PM By: [name] Stall RN, BSN Entered By: [name] on 01/15/2023 15:51:03 -------------------------------------------------------------------------------- Wound Assessment Details Patient Name: Date of Service: [name], [name] 01/15/2023 3:15 PM Medical Record Number: [id number] Patient Account Number: [id number] Date of Birth/Sex: Treating RN: 08-Jan-1941 (82 y.o. [name], [name] Primary Care Anaria Kroner: [name] Other Clinician: Referring Andreina Outten: Treating Kacen Mellinger/Extender: [name] in Treatment: 3 Wound Status Wound Number: 8 Primary Etiology: Lymphedema Wound Location: Left, Medial Lower Leg Wound Status: Open Wounding Event: Gradually Appeared Date Acquired: 01/07/2023 Weeks Of Treatment: 0 Clustered Wound: No Wound Measurements Length: (cm) 1.2 Width: (cm) 1.2 Depth: (cm) 0.1 Area: (cm) 1.131 Volume: (cm) 0.113 % Reduction in Area: 0% % Reduction in Volume: 0% Wound Description Classification: Full  Thickness Without Exposed Suppor Exudate Amount: Medium Exudate Type: Serosanguineous Exudate Color: red, brown t Structures Periwound Skin Texture Texture Color No Abnormalities Noted: No No Abnormalities Noted: No Moisture No Abnormalities Noted: No Treatment Notes Wound #8 (Lower Leg) Wound Laterality: Left, Medial Cleanser Peri-Wound Care Triamcinolone 15 (g) Discharge Instruction: Use triamcinolone 15 (  g) as directed Sween Lotion (Moisturizing lotion) Discharge Instruction: Apply moisturizing lotion as directed Topical Primary Dressing Maxorb Extra Ag+ Alginate Dressing, 2x2 (in/in) Discharge Instruction: Apply to wound bed as instructed Secondary Dressing Woven Gauze Sponge, Non-Sterile 4x4 in Discharge Instruction: Apply over primary dressing as directed. CRISTINE, MARTINCIC ([id number]) [id number] Page 7 of 7 Secured With Compression Wrap ThreePress (3 layer compression wrap) Discharge Instruction: Apply three layer compression as directed. Compression Stockings Add-Ons Electronic Signature(s) Signed: 01/15/2023 4:32:52 PM By: [name] Stall RN, BSN Entered By: [name] on 01/15/2023 15:51:03

## 2023-01-16 NOTE — Progress Notes (Signed)
ALLYCIA, BIRTH ([id number]) [id number] Page 1 of 1 Visit Report for 01/15/2023 SuperBill Details Patient Name: Date of Service: FANITA, MYKLEBUST 01/15/2023 Medical Record Number: [id number] Patient Account Number: [id number] Date of Birth/Sex: Treating RN: [birth year] (82 y.o. [name], Millard.Loa Primary Care Provider: Aliene Beams Other Clinician: Referring Provider: Treating Provider/Extender: Annitta [place] in Treatment: 3 Diagnosis Coding ICD-10 Codes Code Description [phone] Non-pressure chronic ulcer of other part of right lower leg with fat layer exposed L97.822 Non-pressure chronic ulcer of other part of left lower leg with fat layer exposed I87.311 Chronic venous hypertension (idiopathic) with ulcer of right lower extremity E11.622 Type 2 diabetes mellitus with other skin ulcer I89.0 Lymphedema, not elsewhere classified Facility Procedures CPT4 Description Modifier Quantity Code [id number] [phone] BILATERAL: Application of multi-layer venous compression system; leg (below knee), including ankle and 1 foot. Electronic Signature(s) Signed: 01/15/2023 4:32:52 PM By: Shawn Stall RN, BSN Signed: 01/16/2023 11:42:05 AM By: Baltazar Najjar MD Entered By: Shawn Stall on 01/15/2023 15:53:24

## 2023-01-19 ENCOUNTER — Other Ambulatory Visit (HOSPITAL_COMMUNITY): Payer: Self-pay | Admitting: Family Medicine

## 2023-01-22 ENCOUNTER — Ambulatory Visit (HOSPITAL_BASED_OUTPATIENT_CLINIC_OR_DEPARTMENT_OTHER): Payer: Medicare Other | Admitting: Internal Medicine

## 2023-01-24 DIAGNOSIS — M961 Postlaminectomy syndrome, not elsewhere classified: Secondary | ICD-10-CM | POA: Diagnosis not present

## 2023-01-24 DIAGNOSIS — M5459 Other low back pain: Secondary | ICD-10-CM | POA: Diagnosis not present

## 2023-01-24 DIAGNOSIS — G894 Chronic pain syndrome: Secondary | ICD-10-CM | POA: Diagnosis not present

## 2023-01-24 DIAGNOSIS — K5903 Drug induced constipation: Secondary | ICD-10-CM | POA: Diagnosis not present

## 2023-01-24 DIAGNOSIS — M5416 Radiculopathy, lumbar region: Secondary | ICD-10-CM | POA: Diagnosis not present

## 2023-01-24 DIAGNOSIS — N189 Chronic kidney disease, unspecified: Secondary | ICD-10-CM | POA: Diagnosis not present

## 2023-01-28 ENCOUNTER — Ambulatory Visit (INDEPENDENT_AMBULATORY_CARE_PROVIDER_SITE_OTHER): Payer: Medicare Other

## 2023-01-28 DIAGNOSIS — I495 Sick sinus syndrome: Secondary | ICD-10-CM | POA: Diagnosis not present

## 2023-01-28 DIAGNOSIS — N184 Chronic kidney disease, stage 4 (severe): Secondary | ICD-10-CM | POA: Diagnosis not present

## 2023-01-28 LAB — CUP PACEART REMOTE DEVICE CHECK
Battery Remaining Longevity: 37 mo
Battery Voltage: 2.96 V
Brady Statistic AP VP Percent: 0.21 %
Brady Statistic AP VS Percent: 99.5 %
Brady Statistic AS VP Percent: 0 %
Brady Statistic AS VS Percent: 0.29 %
Brady Statistic RA Percent Paced: 99.44 %
Brady Statistic RV Percent Paced: 0.27 %
Date Time Interrogation Session: 20240520110657
Implantable Lead Connection Status: 753985
Implantable Lead Connection Status: 753985
Implantable Lead Implant Date: 20161110
Implantable Lead Implant Date: 20161110
Implantable Lead Location: 753859
Implantable Lead Location: 753860
Implantable Lead Model: 5076
Implantable Lead Model: 5076
Implantable Pulse Generator Implant Date: 20161110
Lead Channel Impedance Value: 342 Ohm
Lead Channel Impedance Value: 361 Ohm
Lead Channel Impedance Value: 380 Ohm
Lead Channel Impedance Value: 418 Ohm
Lead Channel Pacing Threshold Amplitude: 0.625 V
Lead Channel Pacing Threshold Amplitude: 2.125 V
Lead Channel Pacing Threshold Pulse Width: 0.4 ms
Lead Channel Pacing Threshold Pulse Width: 0.4 ms
Lead Channel Sensing Intrinsic Amplitude: 2.875 mV
Lead Channel Sensing Intrinsic Amplitude: 2.875 mV
Lead Channel Sensing Intrinsic Amplitude: 4.25 mV
Lead Channel Sensing Intrinsic Amplitude: 4.25 mV
Lead Channel Setting Pacing Amplitude: 1.5 V
Lead Channel Setting Pacing Amplitude: 4.25 V
Lead Channel Setting Pacing Pulse Width: 0.4 ms
Lead Channel Setting Sensing Sensitivity: 2.8 mV
Zone Setting Status: 755011
Zone Setting Status: 755011

## 2023-01-29 ENCOUNTER — Encounter (HOSPITAL_BASED_OUTPATIENT_CLINIC_OR_DEPARTMENT_OTHER): Payer: Medicare Other | Admitting: Internal Medicine

## 2023-01-29 DIAGNOSIS — N184 Chronic kidney disease, stage 4 (severe): Secondary | ICD-10-CM | POA: Diagnosis not present

## 2023-01-29 DIAGNOSIS — E11622 Type 2 diabetes mellitus with other skin ulcer: Secondary | ICD-10-CM

## 2023-01-29 DIAGNOSIS — I87311 Chronic venous hypertension (idiopathic) with ulcer of right lower extremity: Secondary | ICD-10-CM

## 2023-01-29 DIAGNOSIS — I5032 Chronic diastolic (congestive) heart failure: Secondary | ICD-10-CM | POA: Diagnosis not present

## 2023-01-29 DIAGNOSIS — L97812 Non-pressure chronic ulcer of other part of right lower leg with fat layer exposed: Secondary | ICD-10-CM

## 2023-01-29 DIAGNOSIS — E1122 Type 2 diabetes mellitus with diabetic chronic kidney disease: Secondary | ICD-10-CM | POA: Diagnosis not present

## 2023-01-29 DIAGNOSIS — I890 Lymphedema, not elsewhere classified: Secondary | ICD-10-CM | POA: Diagnosis not present

## 2023-01-29 DIAGNOSIS — I130 Hypertensive heart and chronic kidney disease with heart failure and stage 1 through stage 4 chronic kidney disease, or unspecified chronic kidney disease: Secondary | ICD-10-CM | POA: Diagnosis not present

## 2023-01-29 DIAGNOSIS — L97822 Non-pressure chronic ulcer of other part of left lower leg with fat layer exposed: Secondary | ICD-10-CM

## 2023-01-30 DIAGNOSIS — N184 Chronic kidney disease, stage 4 (severe): Secondary | ICD-10-CM | POA: Diagnosis not present

## 2023-02-07 ENCOUNTER — Encounter (HOSPITAL_BASED_OUTPATIENT_CLINIC_OR_DEPARTMENT_OTHER): Payer: Medicare Other | Admitting: Internal Medicine

## 2023-02-12 ENCOUNTER — Ambulatory Visit (HOSPITAL_BASED_OUTPATIENT_CLINIC_OR_DEPARTMENT_OTHER): Payer: Medicare Other | Admitting: Internal Medicine

## 2023-02-19 NOTE — Progress Notes (Signed)
[name] ([id number]) [id number] Page 1 of 11 Visit Report for 01/14/2023 Arrival Information Details Patient Name: Date of Service: [name], [name] 01/14/2023 3:30 PM Medical Record Number: [id number] Patient Account Number: [id number] Date of Birth/Sex: Treating RN: 11-23-1940 (82 y.o. F) Primary Care Alexys Gassett: Aliene Beams Other Clinician: Referring Leshonda Galambos: Treating [name]/Extender: Annitta Jersey in Treatment: 2 Visit Information History Since Last Visit Added or deleted any medications: No Patient Arrived: [name] Any new allergies or adverse reactions: No Arrival Time: 15:51 Had a fall or experienced change in No Accompanied By: daughter activities of daily living that may affect Transfer Assistance: None risk of falls: Patient Identification Verified: Yes Signs or symptoms of abuse/neglect since last visito No Secondary Verification Process Completed: Yes Hospitalized since last visit: No Patient Requires Transmission-Based Precautions: No Implantable device outside of the clinic excluding No Patient Has Alerts: No cellular tissue based products placed in the center since last visit: Has Compression in Place as Prescribed: Yes Pain Present Now: No Electronic Signature(s) Signed: 01/14/2023 4:50:46 PM By: Thayer Dallas Entered By: Thayer Dallas on 01/14/2023 15:52:02 -------------------------------------------------------------------------------- Compression Therapy Details Patient Name: Date of Service: [name], [name] 01/14/2023 3:30 PM Medical Record Number: [id number] Patient Account Number: [id number] Date of Birth/Sex: Treating RN: 06-26-1941 (82 y.o. F) Primary Care Elmin Wiederholt: Aliene Beams Other Clinician: Referring Rossetta Kama: Treating [name]/Extender: Annitta Jersey in Treatment: 2 Compression Therapy Performed for Wound Assessment: Wound #5 Right,Anterior Lower Leg Performed By:  Clinician , Compression Type: Four Layer Post Procedure Diagnosis Same as Pre-procedure Electronic Signature(s) Signed: 01/14/2023 4:50:46 PM By: Thayer Dallas Entered By: Thayer Dallas on 01/14/2023 16:36:44 -------------------------------------------------------------------------------- Compression Therapy Details Patient Name: Date of Service: [name], [name] 01/14/2023 3:30 PM Medical Record Number: [id number] Patient Account Number: [id number] Date of Birth/Sex: Treating RN: 06/14/1941 (82 y.o. F) Primary Care Coty Larsh: Aliene Beams Other Clinician: Referring Malayah Demuro: Treating [name]/Extender: Annitta Jersey in Treatment: 2 Compression Therapy Performed for Wound Assessment: Wound #6 Left,Dorsal Foot Performed By: Clinician , Compression Type: Four Layer Post Procedure Diagnosis Same as Pre-procedure [name] ([id number]) [phone].pdf Page 2 of 11 Electronic Signature(s) Signed: 01/14/2023 4:50:46 PM By: Thayer Dallas Entered By: Thayer Dallas on 01/14/2023 16:36:44 -------------------------------------------------------------------------------- Compression Therapy Details Patient Name: Date of Service: [name], [name] 01/14/2023 3:30 PM Medical Record Number: [id number] Patient Account Number: [id number] Date of Birth/Sex: Treating RN: 24-Nov-1940 (82 y.o. F) Primary Care Denisa Enterline: Aliene Beams Other Clinician: Referring Tena Linebaugh: Treating Merari Pion/Extender: Annitta Jersey in Treatment: 2 Compression Therapy Performed for Wound Assessment: Wound #7 Right,Medial Ankle Performed By: Clinician , Compression Type: Four Layer Post Procedure Diagnosis Same as Pre-procedure Electronic Signature(s) Signed: 01/14/2023 4:50:46 PM By: Thayer Dallas Entered By: Thayer Dallas on 01/14/2023 16:36:44 -------------------------------------------------------------------------------- Compression Therapy  Details Patient Name: Date of Service: [name], [name] 01/14/2023 3:30 PM Medical Record Number: [id number] Patient Account Number: [id number] Date of Birth/Sex: Treating RN: August 29, 1941 (82 y.o. F) Primary Care Viha Kriegel: Aliene Beams Other Clinician: Referring Trinaty Bundrick: Treating [name]/Extender: Annitta Jersey in Treatment: 2 Compression Therapy Performed for Wound Assessment: Wound #8 Left,Medial Lower Leg Performed By: Clinician , Compression Type: Four Layer Post Procedure Diagnosis Same as Pre-procedure Electronic Signature(s) Signed: 01/14/2023 4:50:46 PM By: Thayer Dallas Entered By: Thayer Dallas on 01/14/2023 16:36:44 -------------------------------------------------------------------------------- Encounter Discharge Information Details Patient Name: Date of Service: [name], [name] 01/14/2023 3:30 PM Medical Record Number: [id number] Patient Account Number: [id number] Date of Birth/Sex: Treating RN: 1941/03/17 (82 y.o. F) Primary Care  Vollie Brunty: Aliene Beams Other Clinician: Referring Krystiana Fornes: Treating [name]/Extender: Annitta Jersey in Treatment: 2 Encounter Discharge Information Items Discharge Condition: Stable Ambulatory Status: Cane Discharge Destination: Home Transportation: Private Auto Accompanied By: daughter Schedule Follow-up Appointment: Yes Clinical Summary of Care: Patient Declined Electronic Signature(s) [name] ([id number]) [id number] Page 3 of 11 Signed: 01/16/2023 4:18:40 PM By: Thayer Dallas Entered By: Thayer Dallas on 01/14/2023 17:03:33 -------------------------------------------------------------------------------- Lower Extremity Assessment Details Patient Name: Date of Service: [name], [name] 01/14/2023 3:30 PM Medical Record Number: [id number] Patient Account Number: [id number] Date of Birth/Sex: Treating RN: Jun 09, 1941 (82 y.o. F) Primary Care Marion Rosenberry:  Aliene Beams Other Clinician: Referring Maximillion Gill: Treating Jun Rightmyer/Extender: Annitta Jersey in Treatment: 2 Edema Assessment Assessed: [name]: No] [Right: No] Edema: [Left: Yes] [Right: Yes] Calf Left: Right: Point of Measurement: 28 cm From Medial Instep 43 cm 43.5 cm Ankle Left: Right: Point of Measurement: 11 cm From Medial Instep 31 cm 34 cm Knee To Floor Left: Right: From Medial Instep 40 cm 40 cm Vascular Assessment Pulses: Dorsalis Pedis Palpable: [Left:Yes] [Right:Yes] Electronic Signature(s) Signed: 01/15/2023 4:32:52 PM By: Shawn Stall RN, BSN Previous Signature: 01/14/2023 4:50:46 PM Version By: Thayer Dallas Entered By: Shawn Stall on 01/15/2023 16:00:28 -------------------------------------------------------------------------------- Multi Wound Chart Details Patient Name: Date of Service: [name], [name] 01/14/2023 3:30 PM Medical Record Number: [id number] Patient Account Number: [id number] Date of Birth/Sex: Treating RN: [birth year] (82 y.o. F) Primary Care Tavarious Freel: Aliene Beams Other Clinician: Referring Lakshmi Sundeen: Treating Samira Acero/Extender: Annitta Jersey in Treatment: 2 Vital Signs Height(in): Pulse(bpm): 64 Weight(lbs): Blood Pressure(mmHg): 163/73 Body Mass Index(BMI): Temperature(F): 98.1 Respiratory Rate(breaths/min): 18 [5:Photos:] [[id number] Page 4 of 11] Right, Anterior Lower Leg Left, Dorsal Foot Right, Medial Ankle Wound Location: Gradually Appeared Blister Gradually Appeared Wounding Event: Lymphedema Diabetic Wound/Ulcer of the Lower Diabetic Wound/Ulcer of the Lower Primary Etiology: Extremity Extremity N/A N/A Lymphedema Secondary Etiology: Lymphedema, Congestive Heart Lymphedema, Congestive Heart Lymphedema, Congestive Heart Comorbid History: Failure, Hypertension, Peripheral Failure, Hypertension, Peripheral Failure, Hypertension, Peripheral Venous  Disease, Type II Diabetes, Venous Disease, Type II Diabetes, Venous Disease, Type II Diabetes, Osteoarthritis, Neuropathy, Osteoarthritis, Neuropathy, Osteoarthritis, Neuropathy, Confinement Anxiety Confinement Anxiety Confinement Anxiety 11/22/2022 12/10/2022 01/08/2023 Date Acquired: 2 2 0 Weeks of Treatment: Open Open Open Wound Status: No No No Wound Recurrence: 1.4x2x0.1 0.2x0.2x0.1 0.4x0.4x0.1 Measurements L x W x D (cm) 2.199 0.031 0.126 A (cm) : rea 0.22 0.003 0.013 Volume (cm) : -600.30% 75.40% 0.00% % Reduction in Area: -249.20% 76.90% 0.00% % Reduction in Volume: Full Thickness Without Exposed Grade 1 Grade 1 Classification: Support Structures Medium Medium Medium Exudate Amount: Serosanguineous Serosanguineous Serosanguineous Exudate Type: red, brown red, brown red, brown Exudate Color: Distinct, outline attached Distinct, outline attached Distinct, outline attached Wound Margin: Large (67-100%) Small (1-33%) Large (67-100%) Granulation Amount: Red, Pink Red Red Granulation Quality: Small (1-33%) Large (67-100%) None Present (0%) Necrotic Amount: Fat Layer (Subcutaneous Tissue): Yes Fat Layer (Subcutaneous Tissue): Yes Fat Layer (Subcutaneous Tissue): Yes Exposed Structures: Fascia: No Fascia: No Fascia: No Tendon: No Tendon: No Tendon: No Muscle: No Muscle: No Muscle: No Joint: No Joint: No Joint: No Bone: No Bone: No Bone: No Small (1-33%) Large (67-100%) Small (1-33%) Epithelialization: Excoriation: No Excoriation: No Excoriation: No Periwound Skin Texture: Induration: No Induration: No Induration: No Callus: No Callus: No Callus: No Crepitus: No Crepitus: No Crepitus: No Rash: No Rash: No Rash: No Scarring: No Scarring: No Scarring: No Maceration: No Maceration: No Maceration: No Periwound Skin Moisture: Dry/Scaly: No Dry/Scaly:  No Dry/Scaly: No Hemosiderin Staining: Yes Atrophie Blanche: No Hemosiderin Staining:  Yes Periwound Skin Color: Atrophie Blanche: No Cyanosis: No Atrophie Blanche: No Cyanosis: No Ecchymosis: No Cyanosis: No Ecchymosis: No Erythema: No Ecchymosis: No Erythema: No Hemosiderin Staining: No Erythema: No Mottled: No Mottled: No Mottled: No Pallor: No Pallor: No Pallor: No Rubor: No Rubor: No Rubor: No N/A N/A N/A Temperature: Compression Therapy Compression Therapy Compression Therapy Procedures Performed: Wound Number: 8 N/A N/A Photos: N/A N/A Left, Medial Lower Leg N/A N/A Wound Location: Gradually Appeared N/A N/A Wounding Event: Lymphedema N/A N/A Primary Etiology: N/A N/A N/A Secondary Etiology: Lymphedema, Congestive Heart N/A N/A Comorbid History: Failure, Hypertension, Peripheral Venous Disease, Type II Diabetes, Osteoarthritis, Neuropathy, Confinement Anxiety 01/07/2023 N/A N/A Date Acquired: 0 N/A N/A Weeks of Treatment: Open N/A N/A Wound Status: No N/A N/A Wound Recurrence: 1.2x1.2x0.1 N/A N/A Measurements L x W x D (cm) 1.131 N/A N/A A (cm) : rea 0.113 N/A N/A Volume (cm) : N/A N/A N/A % Reduction in Area: N/A N/A N/A % Reduction in Volume: Full Thickness Without Exposed N/A N/A Classification: Support Structures [place], Bosie Clos ([id number]) [id number] Page 5 of 11 Medium N/A N/A Exudate A mount: Serosanguineous N/A N/A Exudate Type: red, brown N/A N/A Exudate Color: Distinct, outline attached N/A N/A Wound Margin: Medium (34-66%) N/A N/A Granulation A mount: Red, Pink N/A N/A Granulation Quality: Medium (34-66%) N/A N/A Necrotic A mount: Fat Layer (Subcutaneous Tissue): Yes N/A N/A Exposed Structures: N/A N/A N/A Epithelialization: Scarring: Yes N/A N/A Periwound Skin Texture: Maceration: No N/A N/A Periwound Skin Moisture: Dry/Scaly: No Cyanosis: Yes N/A N/A Periwound Skin Color: Hemosiderin Staining: Yes No Abnormality N/A N/A Temperature: Compression Therapy N/A  N/A Procedures Performed: Treatment Notes Electronic Signature(s) Signed: 01/14/2023 4:51:59 PM By: Baltazar Najjar MD Entered By: Baltazar Najjar on 01/14/2023 16:45:58 -------------------------------------------------------------------------------- Multi-Disciplinary Care Plan Details Patient Name: Date of Service: [name], [name] 01/14/2023 3:30 PM Medical Record Number: [id number] Patient Account Number: [id number] Date of Birth/Sex: Treating RN: February 15, 1941 (82 y.o. F) Primary Care Meia Emley: Aliene Beams Other Clinician: Referring Jasminemarie Sherrard: Treating Anik Wesch/Extender: Annitta Jersey in Treatment: 2 Active Inactive Venous Leg Ulcer Nursing Diagnoses: Knowledge deficit related to disease process and management Potential for venous Insuffiency (use before diagnosis confirmed) Goals: Patient will maintain optimal edema control Date Initiated: 12/25/2022 Target Resolution Date: 01/30/2023 Goal Status: Active Patient/caregiver will verbalize understanding of disease process and disease management Date Initiated: 12/25/2022 Target Resolution Date: 01/24/2023 Goal Status: Active Interventions: Assess peripheral edema status every visit. Compression as ordered Provide education on venous insufficiency Notes: Wound/Skin Impairment Nursing Diagnoses: Impaired tissue integrity Knowledge deficit related to ulceration/compromised skin integrity Goals: Patient/caregiver will verbalize understanding of skin care regimen Date Initiated: 12/25/2022 Target Resolution Date: 02/07/2023 Goal Status: Active Ulcer/skin breakdown will have a volume reduction of 30% by week 4 Date Initiated: 12/25/2022 Target Resolution Date: 01/22/2023 Goal Status: Active Interventions: Assess patient/caregiver ability to obtain necessary supplies Toomsuba, Kayana ([id number]) [id number] Page 6 of 11 Assess patient/caregiver ability to perform ulcer/skin care regimen  upon admission and as needed Assess ulceration(s) every visit Provide education on ulcer and skin care Notes: Electronic Signature(s) Signed: 01/14/2023 4:50:46 PM By: Thayer Dallas Entered By: Thayer Dallas on 01/14/2023 16:22:33 -------------------------------------------------------------------------------- Pain Assessment Details Patient Name: Date of Service: [name], [name] 01/14/2023 3:30 PM Medical Record Number: [id number] Patient Account Number: [id number] Date of Birth/Sex: Treating RN: 25-Aug-1941 (82 y.o. F) Primary Care Kinzie Wickes: Aliene Beams Other Clinician: Referring Monta Police: Treating Malike Foglio/Extender: Toniann Fail,  Lorin Mercy in Treatment: 2 Active Problems Location of Pain Severity and Description of Pain Patient Has Paino No Site Locations Pain Management and Medication Current Pain Management: Electronic Signature(s) Signed: 01/14/2023 4:50:46 PM By: Thayer Dallas Entered By: Thayer Dallas on 01/14/2023 15:53:44 -------------------------------------------------------------------------------- Patient/Caregiver Education Details Patient Name: Date of Service: [name] 5/6/2024andnbsp3:30 PM Medical Record Number: [id number] Patient Account Number: [id number] Date of Birth/Gender: Treating RN: Jul 21, 1941 (82 y.o. F) Primary Care Physician: Aliene Beams Other Clinician: Referring Physician: Treating Physician/Extender: Annitta Jersey in Treatment: 2 Education Assessment Education Provided To: Patient and Caregiver Education Topics Provided Wound/Skin ImpairmentALLYSSA, WAYMENT ([id number]) [id number] Page 7 of 11 Methods: Explain/Verbal Responses: State content correctly Electronic Signature(s) Signed: 01/16/2023 4:18:40 PM By: Thayer Dallas Entered By: Thayer Dallas on 01/14/2023 17:02:35 -------------------------------------------------------------------------------- Wound  Assessment Details Patient Name: Date of Service: [name], [name] 01/14/2023 3:30 PM Medical Record Number: [id number] Patient Account Number: [id number] Date of Birth/Sex: Treating RN: 03/22/1941 (82 y.o. F) Primary Care Aadit Hagood: Aliene Beams Other Clinician: Referring Zilah Villaflor: Treating Bunnie Rehberg/Extender: Annitta Jersey in Treatment: 2 Wound Status Wound Number: 5 Primary Lymphedema Etiology: Wound Location: Right, Anterior Lower Leg Wound Open Wounding Event: Gradually Appeared Status: Date Acquired: 11/22/2022 Comorbid Lymphedema, Congestive Heart Failure, Hypertension, Peripheral Weeks Of Treatment: 2 History: Venous Disease, Type II Diabetes, Osteoarthritis, Neuropathy, Clustered Wound: No Confinement Anxiety Photos Wound Measurements Length: (cm) 1.4 Width: (cm) 2 Depth: (cm) 0.1 Area: (cm) 2.199 Volume: (cm) 0.22 % Reduction in Area: -600.3% % Reduction in Volume: -249.2% Epithelialization: Small (1-33%) Wound Description Classification: Full Thickness Without Exposed Support Structures Wound Margin: Distinct, outline attached Exudate Amount: Medium Exudate Type: Serosanguineous Exudate Color: red, brown Foul Odor After Cleansing: No Slough/Fibrino Yes Wound Bed Granulation Amount: Large (67-100%) Exposed Structure Granulation Quality: Red, Pink Fascia Exposed: No Necrotic Amount: Small (1-33%) Fat Layer (Subcutaneous Tissue) Exposed: Yes Necrotic Quality: Adherent Slough Tendon Exposed: No Muscle Exposed: No Joint Exposed: No Bone Exposed: No Periwound Skin Texture Texture Color No Abnormalities Noted: No No Abnormalities Noted: No Callus: No Atrophie Blanche: No Crepitus: No Cyanosis: No Excoriation: No Ecchymosis: No Induration: No Erythema: No Feldkamp, Haylee ([id number]) [phone].pdf Page 8 of 11 Rash: No Hemosiderin Staining: Yes Scarring: No Mottled: No Pallor: No Moisture Rubor: No No  Abnormalities Noted: No Dry / Scaly: No Maceration: No Electronic Signature(s) Signed: 02/19/2023 7:49:05 AM By: Brenton Grills Entered By: Brenton Grills on 01/14/2023 16:17:24 -------------------------------------------------------------------------------- Wound Assessment Details Patient Name: Date of Service: [name], [name] 01/14/2023 3:30 PM Medical Record Number: [id number] Patient Account Number: [id number] Date of Birth/Sex: Treating RN: 10-27-1940 (82 y.o. F) Primary Care Prestina Raigoza: Aliene Beams Other Clinician: Referring Xavia Kniskern: Treating Suliman Termini/Extender: Annitta Jersey in Treatment: 2 Wound Status Wound Number: 6 Primary Diabetic Wound/Ulcer of the Lower Extremity Etiology: Wound Location: Left, Dorsal Foot Wound Open Wounding Event: Blister Status: Date Acquired: 12/10/2022 Comorbid Lymphedema, Congestive Heart Failure, Hypertension, Peripheral Weeks Of Treatment: 2 History: Venous Disease, Type II Diabetes, Osteoarthritis, Neuropathy, Clustered Wound: No Confinement Anxiety Photos Wound Measurements Length: (cm) 0.2 Width: (cm) 0.2 Depth: (cm) 0.1 Area: (cm) 0.031 Volume: (cm) 0.003 % Reduction in Area: 75.4% % Reduction in Volume: 76.9% Epithelialization: Large (67-100%) Wound Description Classification: Grade 1 Wound Margin: Distinct, outline attached Exudate Amount: Medium Exudate Type: Serosanguineous Exudate Color: red, brown Foul Odor After Cleansing: No Slough/Fibrino Yes Wound Bed Granulation Amount: Small (1-33%) Exposed Structure Granulation Quality: Red Fascia Exposed: No Necrotic Amount: Large (67-100%) Fat Layer (Subcutaneous  Tissue) Exposed: Yes Necrotic Quality: Adherent Slough Tendon Exposed: No Muscle Exposed: No Joint Exposed: No Bone Exposed: No Periwound Skin Texture Texture Color No Abnormalities Noted: No No Abnormalities Noted: No Callus: No Atrophie BlancheEvie Croston, Dacey ([id number])  [id number] Page 9 of 11 Crepitus: No Cyanosis: No Excoriation: No Ecchymosis: No Induration: No Erythema: No Rash: No Hemosiderin Staining: No Scarring: No Mottled: No Pallor: No Moisture Rubor: No No Abnormalities Noted: No Dry / Scaly: No Maceration: No Electronic Signature(s) Signed: 02/19/2023 7:49:05 AM By: Brenton Grills Entered By: Brenton Grills on 01/14/2023 16:18:05 -------------------------------------------------------------------------------- Wound Assessment Details Patient Name: Date of Service: SHANNEL, ZAHM 01/14/2023 3:30 PM Medical Record Number: [id number] Patient Account Number: [id number] Date of Birth/Sex: Treating RN: 1941/06/11 (82 y.o. F) Primary Care Caylyn Tedeschi: Aliene Beams Other Clinician: Referring Meika Earll: Treating Embry Huss/Extender: Annitta Jersey in Treatment: 2 Wound Status Wound Number: 7 Primary Diabetic Wound/Ulcer of the Lower Extremity Etiology: Wound Location: Right, Medial Ankle Secondary Lymphedema Wounding Event: Gradually Appeared Etiology: Date Acquired: 01/08/2023 Wound Open Weeks Of Treatment: 0 Status: Clustered Wound: No Comorbid Lymphedema, Congestive Heart Failure, Hypertension, Peripheral History: Venous Disease, Type II Diabetes, Osteoarthritis, Neuropathy, Confinement Anxiety Photos Wound Measurements Length: (cm) 0.4 Width: (cm) 0.4 Depth: (cm) 0.1 Area: (cm) 0.126 Volume: (cm) 0.013 % Reduction in Area: 0% % Reduction in Volume: 0% Epithelialization: Small (1-33%) Wound Description Classification: Grade 1 Wound Margin: Distinct, outline attached Exudate Amount: Medium Exudate Type: Serosanguineous Exudate Color: red, brown Foul Odor After Cleansing: No Slough/Fibrino No Wound Bed Granulation Amount: Large (67-100%) Exposed Structure Granulation Quality: Red Fascia Exposed: No Necrotic Amount: None Present (0%) Fat Layer (Subcutaneous Tissue)  Exposed: Yes Tendon Exposed: No Muscle Exposed: No Joint Exposed: No Bone Exposed: No Brunkow, Wade ([id number]) [id number] Page 10 of 11 Periwound Skin Texture Texture Color No Abnormalities Noted: No No Abnormalities Noted: No Callus: No Atrophie Blanche: No Crepitus: No Cyanosis: No Excoriation: No Ecchymosis: No Induration: No Erythema: No Rash: No Hemosiderin Staining: Yes Scarring: No Mottled: No Pallor: No Moisture Rubor: No No Abnormalities Noted: No Dry / Scaly: No Maceration: No Electronic Signature(s) Signed: 02/19/2023 7:49:05 AM By: Brenton Grills Entered By: Brenton Grills on 01/14/2023 16:18:45 -------------------------------------------------------------------------------- Wound Assessment Details Patient Name: Date of Service: PAMULA, LUTHER 01/14/2023 3:30 PM Medical Record Number: [id number] Patient Account Number: [id number] Date of Birth/Sex: Treating RN: 1940/09/13 (82 y.o. F) Primary Care Insiya Oshea: Aliene Beams Other Clinician: Referring Shalissa Easterwood: Treating Ishaaq Penna/Extender: Annitta Jersey in Treatment: 2 Wound Status Wound Number: 8 Primary Lymphedema Etiology: Wound Location: Left, Medial Lower Leg Wound Open Wounding Event: Gradually Appeared Status: Date Acquired: 01/07/2023 Comorbid Lymphedema, Congestive Heart Failure, Hypertension, Peripheral Weeks Of Treatment: 0 History: Venous Disease, Type II Diabetes, Osteoarthritis, Neuropathy, Clustered Wound: No Confinement Anxiety Photos Wound Measurements Length: (cm) 1.2 Width: (cm) 1.2 Depth: (cm) 0.1 Area: (cm) 1.131 Volume: (cm) 0.113 % Reduction in Area: % Reduction in Volume: Tunneling: No Undermining: No Wound Description Classification: Full Thickness Without Exposed Su Wound Margin: Distinct, outline attached Exudate Amount: Medium Exudate Type: Serosanguineous Exudate Color: red, brown pport Structures Wound  Bed Granulation Amount: Medium (34-66%) Exposed Structure Granulation Quality: Red, Pink Fat Layer (Subcutaneous Tissue) Exposed: Yes Necrotic Amount: Medium (34-66%) Necrotic Quality: [address] [place], [place] ([id number]) [id number] Page 11 of 11 Periwound Skin Texture Texture Color No Abnormalities Noted: No No Abnormalities Noted: No Scarring: Yes Cyanosis: Yes Hemosiderin Staining: Yes Moisture No Abnormalities Noted: No Temperature / Pain Dry / Scaly: No Temperature:  No Abnormality Maceration: No Electronic Signature(s) Signed: 02/19/2023 7:49:05 AM By: Brenton Grills Entered By: Brenton Grills on 01/14/2023 16:19:24 -------------------------------------------------------------------------------- Vitals Details Patient Name: Date of Service: YASHASVI, MANKA 01/14/2023 3:30 PM Medical Record Number: [id number] Patient Account Number: [id number] Date of Birth/Sex: Treating RN: Oct 23, 1940 (82 y.o. F) Primary Care Koda Defrank: Aliene Beams Other Clinician: Referring Ceili Boshers: Treating Berdine Rasmusson/Extender: Annitta Jersey in Treatment: 2 Vital Signs Time Taken: 15:52 Temperature (F): 98.1 Pulse (bpm): 64 Respiratory Rate (breaths/min): 18 Blood Pressure (mmHg): 163/73 Reference Range: 80 - 120 mg / dl Electronic Signature(s) Signed: 01/14/2023 4:50:46 PM By: Thayer Dallas Entered By: Thayer Dallas on 01/14/2023 15:53:36

## 2023-02-23 DIAGNOSIS — S61011A Laceration without foreign body of right thumb without damage to nail, initial encounter: Secondary | ICD-10-CM | POA: Diagnosis not present

## 2023-02-25 NOTE — Progress Notes (Signed)
Remote pacemaker transmission.   

## 2023-03-05 DIAGNOSIS — R252 Cramp and spasm: Secondary | ICD-10-CM | POA: Diagnosis not present

## 2023-03-05 DIAGNOSIS — R600 Localized edema: Secondary | ICD-10-CM | POA: Diagnosis not present

## 2023-03-05 DIAGNOSIS — S61011D Laceration without foreign body of right thumb without damage to nail, subsequent encounter: Secondary | ICD-10-CM | POA: Diagnosis not present

## 2023-03-07 ENCOUNTER — Other Ambulatory Visit: Payer: Self-pay

## 2023-03-07 MED ORDER — LABETALOL HCL 200 MG PO TABS: 200.0000 mg | ORAL_TABLET | Freq: Two times a day (BID) | ORAL | 2 refills | Status: DC

## 2023-03-08 DIAGNOSIS — E876 Hypokalemia: Secondary | ICD-10-CM | POA: Diagnosis not present

## 2023-03-08 DIAGNOSIS — N2581 Secondary hyperparathyroidism of renal origin: Secondary | ICD-10-CM | POA: Diagnosis not present

## 2023-03-08 DIAGNOSIS — E1122 Type 2 diabetes mellitus with diabetic chronic kidney disease: Secondary | ICD-10-CM | POA: Diagnosis not present

## 2023-03-08 DIAGNOSIS — I129 Hypertensive chronic kidney disease with stage 1 through stage 4 chronic kidney disease, or unspecified chronic kidney disease: Secondary | ICD-10-CM | POA: Diagnosis not present

## 2023-03-08 DIAGNOSIS — N184 Chronic kidney disease, stage 4 (severe): Secondary | ICD-10-CM | POA: Diagnosis not present

## 2023-03-08 DIAGNOSIS — K30 Functional dyspepsia: Secondary | ICD-10-CM | POA: Diagnosis not present

## 2023-03-08 DIAGNOSIS — I5030 Unspecified diastolic (congestive) heart failure: Secondary | ICD-10-CM | POA: Diagnosis not present

## 2023-03-12 DIAGNOSIS — N184 Chronic kidney disease, stage 4 (severe): Secondary | ICD-10-CM | POA: Diagnosis not present

## 2023-03-12 DIAGNOSIS — E1122 Type 2 diabetes mellitus with diabetic chronic kidney disease: Secondary | ICD-10-CM | POA: Diagnosis not present

## 2023-03-12 DIAGNOSIS — Z0000 Encounter for general adult medical examination without abnormal findings: Secondary | ICD-10-CM | POA: Diagnosis not present

## 2023-03-12 DIAGNOSIS — E559 Vitamin D deficiency, unspecified: Secondary | ICD-10-CM | POA: Diagnosis not present

## 2023-03-12 DIAGNOSIS — R5383 Other fatigue: Secondary | ICD-10-CM | POA: Diagnosis not present

## 2023-03-12 DIAGNOSIS — M545 Low back pain, unspecified: Secondary | ICD-10-CM | POA: Diagnosis not present

## 2023-03-12 DIAGNOSIS — I5030 Unspecified diastolic (congestive) heart failure: Secondary | ICD-10-CM | POA: Diagnosis not present

## 2023-03-12 DIAGNOSIS — E118 Type 2 diabetes mellitus with unspecified complications: Secondary | ICD-10-CM | POA: Diagnosis not present

## 2023-03-12 DIAGNOSIS — I130 Hypertensive heart and chronic kidney disease with heart failure and stage 1 through stage 4 chronic kidney disease, or unspecified chronic kidney disease: Secondary | ICD-10-CM | POA: Diagnosis not present

## 2023-03-12 DIAGNOSIS — G8929 Other chronic pain: Secondary | ICD-10-CM | POA: Diagnosis not present

## 2023-03-12 DIAGNOSIS — I872 Venous insufficiency (chronic) (peripheral): Secondary | ICD-10-CM | POA: Diagnosis not present

## 2023-03-12 DIAGNOSIS — E7800 Pure hypercholesterolemia, unspecified: Secondary | ICD-10-CM | POA: Diagnosis not present

## 2023-03-21 DIAGNOSIS — G8929 Other chronic pain: Secondary | ICD-10-CM | POA: Diagnosis not present

## 2023-03-21 DIAGNOSIS — R600 Localized edema: Secondary | ICD-10-CM | POA: Diagnosis not present

## 2023-03-21 DIAGNOSIS — M545 Low back pain, unspecified: Secondary | ICD-10-CM | POA: Diagnosis not present

## 2023-03-27 ENCOUNTER — Encounter (HOSPITAL_BASED_OUTPATIENT_CLINIC_OR_DEPARTMENT_OTHER): Payer: Self-pay

## 2023-03-27 ENCOUNTER — Emergency Department (HOSPITAL_BASED_OUTPATIENT_CLINIC_OR_DEPARTMENT_OTHER)
Admission: EM | Admit: 2023-03-27 | Discharge: 2023-03-27 | Disposition: A | Discharge status: Home / Self Care | Payer: Medicare Other | Attending: Emergency Medicine | Admitting: Emergency Medicine

## 2023-03-27 ENCOUNTER — Emergency Department (HOSPITAL_BASED_OUTPATIENT_CLINIC_OR_DEPARTMENT_OTHER): Payer: Medicare Other

## 2023-03-27 DIAGNOSIS — I130 Hypertensive heart and chronic kidney disease with heart failure and stage 1 through stage 4 chronic kidney disease, or unspecified chronic kidney disease: Secondary | ICD-10-CM | POA: Diagnosis not present

## 2023-03-27 DIAGNOSIS — N183 Chronic kidney disease, stage 3 unspecified: Secondary | ICD-10-CM | POA: Insufficient documentation

## 2023-03-27 DIAGNOSIS — E1122 Type 2 diabetes mellitus with diabetic chronic kidney disease: Secondary | ICD-10-CM | POA: Insufficient documentation

## 2023-03-27 DIAGNOSIS — I700 Atherosclerosis of aorta: Secondary | ICD-10-CM | POA: Diagnosis not present

## 2023-03-27 DIAGNOSIS — R109 Unspecified abdominal pain: Secondary | ICD-10-CM | POA: Diagnosis present

## 2023-03-27 DIAGNOSIS — X58XXXA Exposure to other specified factors, initial encounter: Secondary | ICD-10-CM | POA: Diagnosis not present

## 2023-03-27 DIAGNOSIS — I509 Heart failure, unspecified: Secondary | ICD-10-CM | POA: Insufficient documentation

## 2023-03-27 DIAGNOSIS — S2231XA Fracture of one rib, right side, initial encounter for closed fracture: Secondary | ICD-10-CM | POA: Insufficient documentation

## 2023-03-27 DIAGNOSIS — Z794 Long term (current) use of insulin: Secondary | ICD-10-CM | POA: Diagnosis not present

## 2023-03-27 DIAGNOSIS — Z85038 Personal history of other malignant neoplasm of large intestine: Secondary | ICD-10-CM | POA: Insufficient documentation

## 2023-03-27 DIAGNOSIS — R590 Localized enlarged lymph nodes: Secondary | ICD-10-CM | POA: Diagnosis not present

## 2023-03-27 DIAGNOSIS — Z79899 Other long term (current) drug therapy: Secondary | ICD-10-CM | POA: Diagnosis not present

## 2023-03-27 DIAGNOSIS — M545 Low back pain, unspecified: Secondary | ICD-10-CM | POA: Diagnosis not present

## 2023-03-27 DIAGNOSIS — J90 Pleural effusion, not elsewhere classified: Secondary | ICD-10-CM | POA: Diagnosis not present

## 2023-03-27 LAB — CBC
HCT: 40.4 % (ref 36.0–46.0)
Hemoglobin: 12.8 g/dL (ref 12.0–15.0)
MCH: 30.2 pg (ref 26.0–34.0)
MCHC: 31.7 g/dL (ref 30.0–36.0)
MCV: 95.3 fL (ref 80.0–100.0)
Platelets: 148 10*3/uL — ABNORMAL LOW (ref 150–400)
RBC: 4.24 MIL/uL (ref 3.87–5.11)
RDW: 13.1 % (ref 11.5–15.5)
WBC: 7.2 10*3/uL (ref 4.0–10.5)
nRBC: 0 % (ref 0.0–0.2)

## 2023-03-27 LAB — COMPREHENSIVE METABOLIC PANEL
ALT: 17 U/L (ref 0–44)
AST: 16 U/L (ref 15–41)
Albumin: 4.1 g/dL (ref 3.5–5.0)
Alkaline Phosphatase: 57 U/L (ref 38–126)
Anion gap: 10 (ref 5–15)
BUN: 40 mg/dL — ABNORMAL HIGH (ref 8–23)
CO2: 27 mmol/L (ref 22–32)
Calcium: 9.3 mg/dL (ref 8.9–10.3)
Chloride: 104 mmol/L (ref 98–111)
Creatinine, Ser: 1.52 mg/dL — ABNORMAL HIGH (ref 0.44–1.00)
GFR, Estimated: 34 mL/min — ABNORMAL LOW (ref >60)
Glucose, Bld: 151 mg/dL — ABNORMAL HIGH (ref 70–99)
Potassium: 4 mmol/L (ref 3.5–5.1)
Sodium: 141 mmol/L (ref 135–145)
Total Bilirubin: 0.4 mg/dL (ref 0.3–1.2)
Total Protein: 6.5 g/dL (ref 6.5–8.1)

## 2023-03-27 LAB — URINALYSIS, ROUTINE W REFLEX MICROSCOPIC
Bilirubin Urine: NEGATIVE
Glucose, UA: NEGATIVE mg/dL
Hgb urine dipstick: NEGATIVE
Ketones, ur: NEGATIVE mg/dL
Leukocytes,Ua: NEGATIVE
Nitrite: NEGATIVE
Protein, ur: NEGATIVE mg/dL
Specific Gravity, Urine: 1.012 (ref 1.005–1.030)
pH: 5.5 (ref 5.0–8.0)

## 2023-03-27 NOTE — Discharge Instructions (Addendum)
Take your pain medicine.  You can follow-up with Dr. Ethelene Hal if needed to adjust your medications.

## 2023-03-27 NOTE — ED Provider Notes (Signed)
[place] EMERGENCY DEPARTMENT AT [facility] Provider Note   CSN: [id number] Arrival date & time: 03/27/23  1919     History  Chief Complaint  Patient presents with   Back Pain    [name] is a 82 y.o. female.   Back Pain Patient with right-sided back/flank pain.  Has had for the last week.  Went to Ortho and was sent here for further evaluation with CT scan of the chest abdomen pelvis.  No known fall.  However does have some bruising on the right flank.  Worse with certain movements.  Worse with palpation.  No dysuria.    Past Medical History:  Diagnosis Date   Anxiety    Arthritis    Cancer (HCC)    colon   CHF (congestive heart failure) (HCC)    Chronic kidney disease    stage 3   Chronic pain syndrome    Diabetes (HCC)    Diabetic neuropathy (HCC)    Dyslipidemia    Early cataracts, bilateral    Fibromyalgia    GERD (gastroesophageal reflux disease)    H/O syncope    Heart murmur    Hypertension    Lumbar spinal stenosis    and scoliosis   Pneumonia    PONV (postoperative nausea and vomiting)    Presence of permanent cardiac pacemaker    Restless legs    Spinal headache    Thyroid nodule     Home Medications Prior to Admission medications   Medication Sig Start Date End Date Taking? Authorizing Provider  acetaminophen (TYLENOL) 650 MG CR tablet 2 tablets    [provider]  B-D UF III MINI PEN NEEDLES 31G X 5 MM MISC  09/16/19   [provider]  Blood Glucose Monitoring Suppl (ACCU-CHEK AVIVA PLUS) w/Device KIT  02/08/20   [provider]  calcium carbonate (OS-CAL) 600 MG TABS tablet Patient takes 1 tablet by mouth twice a week.    [provider]  Cholecalciferol (VITAMIN D) 2000 UNITS tablet Take 2,000 Units by mouth daily.    [provider]  Continuous Blood Gluc Sensor (FREESTYLE LIBRE 2 SENSOR) MISC See admin instructions.    [provider]  cyclobenzaprine (FLEXERIL) 10 MG  tablet Take 10 mg by mouth 3 (three) times daily as needed for muscle spasms.    [provider]  diclofenac Sodium (VOLTAREN) 1 % GEL Apply 1 application topically at bedtime as needed (sleep).    [provider]  diphenhydrAMINE (BENADRYL) 25 MG tablet Take 25 mg by mouth daily as needed for itching or allergies.    [provider]  DULoxetine (CYMBALTA) 20 MG capsule Take 20 mg by mouth daily.    [provider]  gabapentin (NEURONTIN) 300 MG capsule Take 300 mg by mouth at bedtime. 03/28/20   [provider]  hydrALAZINE (APRESOLINE) 100 MG tablet Take 1 tablet (100 mg total) by mouth 3 (three) times daily. 08/09/20   Bensimhon, Bevelyn Buckles, MD  insulin degludec (TRESIBA FLEXTOUCH) 100 UNIT/ML FlexTouch Pen 50 Units as directed.    [provider]  labetalol (NORMODYNE) 200 MG tablet Take 1 tablet (200 mg total) by mouth 2 (two) times daily. Absolute last refill without office visit please call [phone] to schedule follow up 03/07/23   Bensimhon, Bevelyn Buckles, MD  metolazone (ZAROXOLYN) 2.5 MG tablet Take 1 tablet (2.5 mg total) by mouth once a week. Every Friday 06/04/22   Jacklynn Ganong, FNP  OZEMPIC, 0.25  OR 0.5 MG/DOSE, 2 MG/3ML SOPN Inject into the skin. 04/20/22   [provider]  potassium chloride SA (KLOR-CON M) 20 MEQ tablet Take 1 tablet (20 mEq total) by mouth daily. Take 2 extra tab on Friday with metolazone 05/23/22   Jacklynn Ganong, FNP  pramipexole (MIRAPEX) 0.125 MG tablet Take 0.25 mg by mouth 2 (two) times daily.    [provider]  rosuvastatin (CRESTOR) 10 MG tablet Take 10 mg by mouth daily.  06/23/19   [provider]  torsemide (DEMADEX) 20 MG tablet Take 2 tablets (40 mg total) by mouth daily. 10/20/21   Milford, Anderson Malta, FNP  traMADol (ULTRAM) 50 MG tablet Take 2 tablets by mouth daily as needed. 4 times a day if    [provider]  triamcinolone ointment (KENALOG) 0.1 % SMARTSIG:2  Topical Twice Daily 11/01/21   [provider]      Allergies    Penicillins, Codeine, Adhesive [tape], Aspirin, Crab (diagnostic), Iodine, Ivp dye [iodinated contrast media], and Sulfa antibiotics    Review of Systems   Review of Systems  Musculoskeletal:  Positive for back pain.    Physical Exam Updated Vital Signs BP (!) 159/56   Pulse 71   Temp 98.3 F (36.8 C) (Oral)   Resp 18   SpO2 95%  Physical Exam Vitals and nursing note reviewed.  Eyes:     Pupils: Pupils are equal, round, and reactive to light.  Cardiovascular:     Rate and Rhythm: Regular rhythm.  Pulmonary:     Comments: Tenderness to right posterior lower chest wall.  Does have small area of bruising.  No crepitance.  No deformity. Chest:     Chest wall: Tenderness present.  Abdominal:     Tenderness: There is no abdominal tenderness.  Musculoskeletal:     Cervical back: Neck supple.  Neurological:     Mental Status: She is alert.     ED Results / Procedures / Treatments   Labs (all labs ordered are listed, but only abnormal results are displayed) Labs Reviewed  URINALYSIS, ROUTINE W REFLEX MICROSCOPIC - Abnormal; Notable for the following components:      Result Value   Color, Urine COLORLESS (*)    All other components within normal limits  COMPREHENSIVE METABOLIC PANEL - Abnormal; Notable for the following components:   Glucose, Bld 151 (*)    BUN 40 (*)    Creatinine, Ser 1.52 (*)    GFR, Estimated 34 (*)    All other components within normal limits  CBC - Abnormal; Notable for the following components:   Platelets 148 (*)    All other components within normal limits    EKG None  Radiology CT CHEST ABDOMEN PELVIS WO CONTRAST  Result Date: 03/27/2023 CLINICAL DATA:  Chest pain, right flank pain EXAM: CT CHEST, ABDOMEN AND PELVIS WITHOUT CONTRAST TECHNIQUE: Multidetector CT imaging of the chest, abdomen and pelvis was performed following the standard protocol without IV contrast.  RADIATION DOSE REDUCTION: This exam was performed according to the departmental dose-optimization program which includes automated exposure control, adjustment of the mA and/or kV according to patient size and/or use of iterative reconstruction technique. COMPARISON:  None Available. FINDINGS: CT CHEST FINDINGS Cardiovascular: Cardiac size is within normal limits. No significant coronary artery calcification. Left subclavian dual lead pacemaker is seen with leads within the right atrium and right ventricle toward the apex. No pericardial effusion. Central pulmonary arteries are enlarged in keeping with changes of pulmonary  arterial hypertension. Moderate atherosclerotic calcification within the thoracic aorta. No aortic aneurysm. Mediastinum/Nodes: No enlarged mediastinal, hilar, or axillary lymph nodes. Thyroid gland, trachea, and esophagus demonstrate no significant findings. Lungs/Pleura: Mild bibasilar subpleural pulmonary fibrotic change is noted. The lungs are otherwise clear. No pneumothorax. Small right pleural effusion noted. No central obstructing mass. Musculoskeletal: There is an acute mildly displaced fracture of the right eleventh rib posterior medially with 1 shaft width depression and mild override of the distal fracture fragment. Multiple additional remote healed right rib fractures noted. No lytic or blastic bone lesion. CT ABDOMEN PELVIS FINDINGS Hepatobiliary: No focal liver abnormality is seen. No gallstones, gallbladder wall thickening, or biliary dilatation. Pancreas: Unremarkable Spleen: Unremarkable Adrenals/Urinary Tract: The adrenal glands are unremarkable. The kidneys are normal in position. Marked atrophy of the lower pole the right kidney. The kidneys are otherwise unremarkable. Bladder unremarkable. Stomach/Bowel: Status post sigmoid colectomy with colorectal anastomotic staple line noted. The stomach, small bowel and large bowel are otherwise unremarkable. Appendix not identified and  likely absent. Wispy mesenteric infiltration with associated shotty mesenteric adenopathy is nonspecific, possibly reactive or inflammatory in nature as can be seen with mesenteric adenitis. No free intraperitoneal gas or fluid. Vascular/Lymphatic: Moderate aortoiliac atherosclerotic calcification. No aortic aneurysm. No frankly pathologic adenopathy within the abdomen and pelvis. Reproductive: Uterus and bilateral adnexa are unremarkable. Other: No abdominal wall hernia Musculoskeletal: Degenerative changes are seen within the lumbar spine. No acute bone abnormality. No lytic or blastic bone lesion. IMPRESSION: 1. Acute mildly displaced fracture of the right eleventh rib posterior medially with 1 shaft width depression and mild override of the distal fracture fragment. No pneumothorax. Small right pleural effusion. 2. Mild bibasilar subpleural pulmonary fibrotic change. 3. Enlarged central pulmonary arteries in keeping with changes of pulmonary arterial hypertension. 4. Wispy mesenteric infiltration with associated shotty mesenteric adenopathy is nonspecific, possibly reactive or inflammatory in nature as can be seen with mesenteric adenitis. Aortic Atherosclerosis (ICD10-I70.0). Electronically Signed   By: Helyn Numbers M.D.   On: 03/27/2023 22:33    Procedures Procedures    Medications Ordered in ED Medications - No data to display  ED Course/ Medical Decision Making/ A&P                             Medical Decision Making Amount and/or Complexity of Data Reviewed Labs: ordered. Radiology: ordered.   Patient with right flank/chest pain.  Reproducible with movement.  Does have a small bruise but had no injury.  Reportedly had been massaging the area.  Differential diagnose includes kidney stone with the flank pain.  Also rib fractures and infection.  Will get CT scan without contrast to evaluate.  CBC reassuring.  CMP still pending.  CT dye allergy however will preclude use of CT at contrast at  this time. Urine did not show blood or infection.  CT scan shows rib fracture.  Likely the cause of the pain.  Already on chronic pain medicines.  Can follow-up with her pain doctor if needed.  Will discharge home.        Final Clinical Impression(s) / ED Diagnoses Final diagnoses:  Closed fracture of one rib of right side, initial encounter    Rx / DC Orders ED Discharge Orders     None         Benjiman Core, MD 03/27/23 2355

## 2023-03-27 NOTE — ED Notes (Signed)
Pt.s lower bilateral legs wrapped with clean gauze dressings, secured with wide gauze and coban, after MD's approval to do so at pt's request.

## 2023-03-27 NOTE — ED Triage Notes (Signed)
Pt c/o pain in R sided back pain, "above my waist & below bra," onset approx 1wk ago, "started getting really bad yesterday." Went to UC, advised to seek further eval at ED. Thinks it's her kidneys, denies additional/ urinary symptoms.

## 2023-03-28 DIAGNOSIS — S2231XA Fracture of one rib, right side, initial encounter for closed fracture: Secondary | ICD-10-CM | POA: Diagnosis not present

## 2023-04-08 ENCOUNTER — Other Ambulatory Visit: Payer: Self-pay | Admitting: Family Medicine

## 2023-04-08 DIAGNOSIS — Z9189 Other specified personal risk factors, not elsewhere classified: Secondary | ICD-10-CM

## 2023-04-08 DIAGNOSIS — Z87310 Personal history of (healed) osteoporosis fracture: Secondary | ICD-10-CM

## 2023-04-15 ENCOUNTER — Encounter (HOSPITAL_BASED_OUTPATIENT_CLINIC_OR_DEPARTMENT_OTHER): Payer: Medicare Other | Attending: Internal Medicine | Admitting: General Surgery

## 2023-04-15 DIAGNOSIS — E1122 Type 2 diabetes mellitus with diabetic chronic kidney disease: Secondary | ICD-10-CM | POA: Insufficient documentation

## 2023-04-15 DIAGNOSIS — I130 Hypertensive heart and chronic kidney disease with heart failure and stage 1 through stage 4 chronic kidney disease, or unspecified chronic kidney disease: Secondary | ICD-10-CM | POA: Insufficient documentation

## 2023-04-15 DIAGNOSIS — I5032 Chronic diastolic (congestive) heart failure: Secondary | ICD-10-CM | POA: Diagnosis not present

## 2023-04-15 DIAGNOSIS — I890 Lymphedema, not elsewhere classified: Secondary | ICD-10-CM | POA: Insufficient documentation

## 2023-04-15 DIAGNOSIS — E1142 Type 2 diabetes mellitus with diabetic polyneuropathy: Secondary | ICD-10-CM | POA: Diagnosis not present

## 2023-04-15 DIAGNOSIS — L97821 Non-pressure chronic ulcer of other part of left lower leg limited to breakdown of skin: Secondary | ICD-10-CM | POA: Diagnosis not present

## 2023-04-15 DIAGNOSIS — I872 Venous insufficiency (chronic) (peripheral): Secondary | ICD-10-CM | POA: Insufficient documentation

## 2023-04-15 DIAGNOSIS — Z833 Family history of diabetes mellitus: Secondary | ICD-10-CM | POA: Insufficient documentation

## 2023-04-15 DIAGNOSIS — Z905 Acquired absence of kidney: Secondary | ICD-10-CM | POA: Diagnosis not present

## 2023-04-15 DIAGNOSIS — L97811 Non-pressure chronic ulcer of other part of right lower leg limited to breakdown of skin: Secondary | ICD-10-CM | POA: Insufficient documentation

## 2023-04-15 DIAGNOSIS — E11622 Type 2 diabetes mellitus with other skin ulcer: Secondary | ICD-10-CM | POA: Diagnosis not present

## 2023-04-15 DIAGNOSIS — N184 Chronic kidney disease, stage 4 (severe): Secondary | ICD-10-CM | POA: Diagnosis not present

## 2023-04-15 DIAGNOSIS — Z950 Presence of cardiac pacemaker: Secondary | ICD-10-CM | POA: Diagnosis not present

## 2023-04-15 DIAGNOSIS — Z8249 Family history of ischemic heart disease and other diseases of the circulatory system: Secondary | ICD-10-CM | POA: Insufficient documentation

## 2023-04-15 NOTE — Progress Notes (Signed)
MOE, ROHLFS ([id number]) [id number] Page 1 of 4 Visit Report for 04/15/2023 Abuse Risk Screen Details Patient Name: Date of Service: [name], [name] 04/15/2023 9:00 A M Medical Record Number: [id number] Patient Account Number: [id number] Date of Birth/Sex: Treating RN: 11-19-1940 (82 y.o. [name] Primary Care : Aliene Beams Other Clinician: Referring : Treating /Extender: Karna Dupes in Treatment: 0 Abuse Risk Screen Items Answer ABUSE RISK SCREEN: Has anyone close to you tried to hurt or harm you recentlyo No Do you feel uncomfortable with anyone in your familyo No Has anyone forced you do things that you didnt want to doo No Electronic Signature(s) Signed: 04/15/2023 5:40:23 PM By: Zenaida Deed RN, BSN Entered By: Zenaida Deed on 04/15/2023 09:32:05 -------------------------------------------------------------------------------- Activities of Daily Living Details Patient Name: Date of Service: [name], [name] 04/15/2023 9:00 A M Medical Record Number: [id number] Patient Account Number: [id number] Date of Birth/Sex: Treating RN: Aug 08, 1941 (82 y.o. [name] Primary Care : Aliene Beams Other Clinician: Referring : Treating /Extender: Karna Dupes in Treatment: 0 Activities of Daily Living Items Answer Activities of Daily Living (Please select one for each item) Drive Automobile Completely Able T Medications ake Completely Able Use T elephone Completely Able Care for Appearance Completely Able Use T oilet Completely Able Bath / Shower Completely Able Dress Self Completely Able Feed Self Completely Able Walk Need Assistance Get In / Out Bed Completely Able Housework Need Assistance Prepare Meals Completely Able Handle Money Completely Able Shop for Self Need Assistance Electronic Signature(s) Signed: 04/15/2023  5:40:23 PM By: Zenaida Deed RN, BSN Entered By: Zenaida Deed on 04/15/2023 09:32:51 [name] ([id number]) [phone] [id number] Page 2 of 4 -------------------------------------------------------------------------------- Education Screening Details Patient Name: Date of Service: [name], [name] 04/15/2023 9:00 A M Medical Record Number: [id number] Patient Account Number: [id number] Date of Birth/Sex: Treating RN: Nov 30, 1940 (82 y.o. [name] Primary Care : Aliene Beams Other Clinician: Referring : Treating /Extender: Karna Dupes in Treatment: 0 Primary Learner Assessed: Patient Learning Preferences/Education Level/Primary Language Learning Preference: Explanation, Demonstration, Printed Material Highest Education Level: College or Above Preferred Language: English Cognitive Barrier Language Barrier: No Translator Needed: No Memory Deficit: No Emotional Barrier: No Cultural/Religious Beliefs Affecting Medical Care: No Physical Barrier Impaired Vision: Yes Glasses Impaired Hearing: No Decreased Hand dexterity: No Knowledge/Comprehension Knowledge Level: High Comprehension Level: High Ability to understand written instructions: High Ability to understand verbal instructions: High Motivation Anxiety Level: Calm Cooperation: Cooperative Education Importance: Acknowledges Need Interest in Health Problems: Asks Questions Perception: Coherent Willingness to Engage in Self-Management High Activities: Readiness to Engage in Self-Management High Activities: Electronic Signature(s) Signed: 04/15/2023 5:40:23 PM By: Zenaida Deed RN, BSN Entered By: Zenaida Deed on 04/15/2023 09:33:59 -------------------------------------------------------------------------------- Fall Risk Assessment Details Patient Name: Date of Service: [name], [name] 04/15/2023 9:00 A M Medical Record Number:  [id number] Patient Account Number: [id number] Date of Birth/Sex: Treating RN: 1940/10/09 (82 y.o. [name] Primary Care : Aliene Beams Other Clinician: Referring : Treating /Extender: Karna Dupes in Treatment: 0 Fall Risk Assessment Items Have you had 2 or more falls in the last 12 monthso 0 Yes [name], [name] ([id number]) [id number] Page 3 of 4 Have you had any fall that resulted in injury in the last 12 monthso 0 No FALLS RISK SCREEN History of falling - immediate or within 3 months 25 Yes Secondary diagnosis (Do you have 2 or more medical diagnoseso) 0 No Ambulatory aid None/bed rest/wheelchair/nurse  0 No Crutches/cane/walker 15 Yes Furniture 0 No Intravenous therapy Access/Saline/Heparin Lock 0 No Gait/Transferring Normal/ bed rest/ wheelchair 0 No Weak (short steps with or without shuffle, stooped but able to lift head while walking, may seek 10 Yes support from furniture) Impaired (short steps with shuffle, may have difficulty arising from chair, head down, impaired 0 No balance) Mental Status Oriented to own ability 0 Yes Electronic Signature(s) Signed: 04/15/2023 5:40:23 PM By: Zenaida Deed RN, BSN Entered By: Zenaida Deed on 04/15/2023 09:34:30 -------------------------------------------------------------------------------- Foot Assessment Details Patient Name: Date of Service: [name], [name] 04/15/2023 9:00 A M Medical Record Number: [id number] Patient Account Number: [id number] Date of Birth/Sex: Treating RN: 1941-05-10 (82 y.o. [name] Primary Care : Aliene Beams Other Clinician: Referring : Treating /Extender: Karna Dupes in Treatment: 0 Foot Assessment Items Site Locations + = Sensation present, - = Sensation absent, C = Callus, U = Ulcer R = Redness, W = Warmth, M = Maceration, PU = Pre-ulcerative  lesion F = Fissure, S = Swelling, D = Dryness Assessment Right: Left: Other Deformity: No No Prior Foot Ulcer: No No Prior Amputation: No No Charcot Joint: No No Ambulatory Status: Ambulatory With Help Assistance Device: [name], [name] ([id number]) [id number] Page 4 of 4 Gait: Steady Electronic Signature(s) Signed: 04/15/2023 5:40:23 PM By: Zenaida Deed RN, BSN Entered By: Zenaida Deed on 04/15/2023 09:42:30 -------------------------------------------------------------------------------- Nutrition Risk Screening Details Patient Name: Date of Service: [name], [name] 04/15/2023 9:00 A M Medical Record Number: [id number] Patient Account Number: [id number] Date of Birth/Sex: Treating RN: 12-04-1940 (82 y.o. [name] Primary Care : Aliene Beams Other Clinician: Referring : Treating /Extender: Karna Dupes in Treatment: 0 Height (in): Weight (lbs): Body Mass Index (BMI): Nutrition Risk Screening Items Score Screening NUTRITION RISK SCREEN: I have an illness or condition that made me change the kind and/or amount of food I eat 0 No I eat fewer than two meals per day 0 No I eat few fruits and vegetables, or milk products 0 No I have three or more drinks of beer, liquor or wine almost every day 0 No I have tooth or mouth problems that make it hard for me to eat 0 No I don't always have enough money to buy the food I need 0 No I eat alone most of the time 1 Yes I take three or more different prescribed or over-the-counter drugs a day 1 Yes Without wanting to, I have lost or gained 10 pounds in the last six months 0 No I am not always physically able to shop, cook and/or feed myself 0 No Nutrition Protocols Good Risk Protocol 0 No interventions needed Moderate Risk Protocol High Risk Proctocol Risk Level: Good Risk Score: 2 Electronic Signature(s) Signed: 04/15/2023 5:40:23 PM By:  Zenaida Deed RN, BSN Entered By: Zenaida Deed on 04/15/2023 09:35:21

## 2023-04-15 NOTE — Progress Notes (Signed)
[name], [name] ([id number]) [id number] Page 1 of 11 Visit Report for 04/15/2023 Allergy List Details Patient Name: Date of Service: [name], GILANI 04/15/2023 9:00 A M Medical Record Number: [id number] Patient Account Number: [id number] Date of Birth/Sex: Treating RN: [birth year] (82 y.o. [name] Primary Care : Aliene Beams Other Clinician: Referring : Treating /Extender: Karna Dupes in Treatment: 0 Allergies Active Allergies penicillin codeine adhesive aspirin iodine Iodinated Contrast Media Sulfa (Sulfonamide Antibiotics) Allergy Notes Electronic Signature(s) Signed: 04/15/2023 5:40:23 PM By: Zenaida Deed RN, BSN Entered By: Zenaida Deed on 04/15/2023 09:21:19 -------------------------------------------------------------------------------- Arrival Information Details Patient Name: Date of Service: [name], GUERRERA 04/15/2023 9:00 A M Medical Record Number: [id number] Patient Account Number: [id number] Date of Birth/Sex: Treating RN: Aug 23, 1941 (82 y.o. [name] Primary Care : Aliene Beams Other Clinician: Referring : Treating /Extender: Karna Dupes in Treatment: 0 Visit Information Patient Arrived: Danella Maiers Time: 09:15 Accompanied By: self Transfer Assistance: None Patient Identification Verified: Yes Secondary Verification Process Completed: Yes History Since Last Visit Any new allergies or adverse reactions: No Signs or symptoms of abuse/neglect since last visito No Hospitalized since last visit: No Has Dressing in Place as Prescribed: Yes Has Compression in Place as Prescribed: Yes Electronic Signature(s) Signed: 04/15/2023 5:40:23 PM By: Zenaida Deed RN, BSN Entered By: Zenaida Deed on 04/15/2023 09:21:05 [name] ([id number]) [id number] Page 2 of  11 -------------------------------------------------------------------------------- Clinic Level of Care Assessment Details Patient Name: Date of Service: [name], [name] 04/15/2023 9:00 A M Medical Record Number: [id number] Patient Account Number: [id number] Date of Birth/Sex: Treating RN: 02-06-1941 (82 y.o. [name] Primary Care : Aliene Beams Other Clinician: Referring : Treating /Extender: Karna Dupes in Treatment: 0 Clinic Level of Care Assessment Items TOOL 2 Quantity Score []  - 0 Use when only an EandM is performed on the INITIAL visit ASSESSMENTS - Nursing Assessment / Reassessment X- 1 20 General Physical Exam (combine w/ comprehensive assessment (listed just below) when performed on new pt. evals) X- 1 25 Comprehensive Assessment (HX, ROS, Risk Assessments, Wounds Hx, etc.) ASSESSMENTS - Wound and Skin A ssessment / Reassessment []  - 0 Simple Wound Assessment / Reassessment - one wound X- 2 5 Complex Wound Assessment / Reassessment - multiple wounds []  - 0 Dermatologic / Skin Assessment (not related to wound area) ASSESSMENTS - Ostomy and/or Continence Assessment and Care []  - 0 Incontinence Assessment and Management []  - 0 Ostomy Care Assessment and Management (repouching, etc.) PROCESS - Coordination of Care X - Simple Patient / Family Education for ongoing care 1 15 []  - 0 Complex (extensive) Patient / Family Education for ongoing care X- 1 10 Staff obtains Chiropractor, Records, T Results / Process Orders est []  - 0 Staff telephones HHA, Nursing Homes / Clarify orders / etc []  - 0 Routine Transfer to another Facility (non-emergent condition) []  - 0 Routine Hospital Admission (non-emergent condition) X- 1 15 New Admissions / Manufacturing engineer / Ordering NPWT Apligraf, etc. , []  - 0 Emergency Hospital Admission (emergent condition) X- 1 10 Simple Discharge Coordination []  - 0 Complex  (extensive) Discharge Coordination PROCESS - Special Needs []  - 0 Pediatric / Minor Patient Management []  - 0 Isolation Patient Management []  - 0 Hearing / Language / Visual special needs []  - 0 Assessment of Community assistance (transportation, D/C planning, etc.) []  - 0 Additional assistance / Altered mentation []  - 0 Support Surface(s) Assessment (bed, cushion, seat, etc.) INTERVENTIONS - Wound Cleansing / Measurement  X- 1 5 Wound Imaging (photographs - any number of wounds) []  - 0 Wound Tracing (instead of photographs) []  - 0 Simple Wound Measurement - one wound X- 2 5 Complex Wound Measurement - multiple wounds Batiz, Tamecka ([id number]) [id number] Page 3 of 11 []  - 0 Simple Wound Cleansing - one wound X- 2 5 Complex Wound Cleansing - multiple wounds INTERVENTIONS - Wound Dressings []  - 0 Small Wound Dressing one or multiple wounds X- 2 15 Medium Wound Dressing one or multiple wounds []  - 0 Large Wound Dressing one or multiple wounds []  - 0 Application of Medications - injection INTERVENTIONS - Miscellaneous []  - 0 External ear exam []  - 0 Specimen Collection (cultures, biopsies, blood, body fluids, etc.) []  - 0 Specimen(s) / Culture(s) sent or taken to Lab for analysis []  - 0 Patient Transfer (multiple staff / Nurse, adult / Similar devices) []  - 0 Simple Staple / Suture removal (25 or less) []  - 0 Complex Staple / Suture removal (26 or more) []  - 0 Hypo / Hyperglycemic Management (close monitor of Blood Glucose) []  - 0 Ankle / Brachial Index (ABI) - do not check if billed separately Has the patient been seen at the hospital within the last three years: Yes Total Score: 160 Level Of Care: New/Established - Level 5 Electronic Signature(s) Signed: 04/15/2023 5:40:23 PM By: Zenaida Deed RN, BSN Entered By: Zenaida Deed on 04/15/2023  14:42:26 -------------------------------------------------------------------------------- Encounter Discharge Information Details Patient Name: Date of Service: [name], [name] 04/15/2023 9:00 A M Medical Record Number: [id number] Patient Account Number: [id number] Date of Birth/Sex: Treating RN: 1941/08/09 (82 y.o. [name] Primary Care : Aliene Beams Other Clinician: Referring : Treating /Extender: Karna Dupes in Treatment: 0 Encounter Discharge Information Items Discharge Condition: Stable Ambulatory Status: Cane Discharge Destination: Home Transportation: Private Auto Accompanied By: self Schedule Follow-up Appointment: Yes Clinical Summary of Care: Patient Declined Electronic Signature(s) Signed: 04/15/2023 5:40:23 PM By: Zenaida Deed RN, BSN Entered By: Zenaida Deed on 04/15/2023 17:11:17 Lower Extremity Assessment Details -------------------------------------------------------------------------------- [name] ([id number]) [id number] Page 4 of 11 Patient Name: Date of Service: [name], KILMER 04/15/2023 9:00 A M Medical Record Number: [id number] Patient Account Number: [id number] Date of Birth/Sex: Treating RN: Jan 23, 1941 (82 y.o. [name] Primary Care : Aliene Beams Other Clinician: Referring : Treating /Extender: Karna Dupes in Treatment: 0 Edema Assessment Left: Right: Assessed: No No Edema: Yes Yes Calf Left: Right: Point of Measurement: From Medial Instep 41.5 cm 42 cm Ankle Left: Right: Point of Measurement: From Medial Instep 27.5 cm 27 cm Knee To Floor Left: Right: From Medial Instep 39 cm 39 cm Vascular Assessment Left: Right: Pulses: Dorsalis Pedis Palpable: Yes Yes Extremity colors, hair growth, and conditions: Extremity Color: Hyperpigmented Hyperpigmented Hair Growth on Extremity: No  No Temperature of Extremity: Warm Warm Capillary Refill: < 3 seconds < 3 seconds Dependent Rubor: No No Blanched when Elevated: No No Lipodermatosclerosis: No No Toe Nail Assessment Left: Right: Thick: No No Discolored: Yes Yes Deformed: No No Improper Length and Hygiene: No No Electronic Signature(s) Signed: 04/15/2023 5:40:23 PM By: Zenaida Deed RN, BSN Entered By: Zenaida Deed on 04/15/2023 10:18:44 -------------------------------------------------------------------------------- Multi Wound Chart Details Patient Name: Date of Service: Charline Bills 04/15/2023 9:00 A M Medical Record Number: [id number] Patient Account Number: [id number] Date of Birth/Sex: Treating RN: [birth year] (82 y.o. F) Primary Care : Aliene Beams Other Clinician: Referring : Treating /Extender: Karna Dupes in Treatment: 0 Vital  Signs Height(in): Capillary Blood Glucose(mg/dl): 782 Weight(lbs): Pulse(bpm): 80 Body Mass Index(BMI): Blood Pressure(mmHg): 169/73 Temperature(F): 98.1 Respiratory Rate(breaths/min): 20 Spackman, Shakedra ([id number]) [id number] Page 5 of 11 [10:Photos:] [N/A:N/A] Left, Medial Lower Leg Right, Lateral Lower Leg N/A Wound Location: Gradually Appeared Gradually Appeared N/A Wounding Event: Lymphedema Lymphedema N/A Primary Etiology: Diabetic Wound/Ulcer of the Lower Diabetic Wound/Ulcer of the Lower N/A Secondary Etiology: Extremity Extremity Lymphedema, Congestive Heart Lymphedema, Congestive Heart N/A Comorbid History: Failure, Hypertension, Peripheral Failure, Hypertension, Peripheral Venous Disease, Type II Diabetes, Venous Disease, Type II Diabetes, Osteoarthritis, Neuropathy, Osteoarthritis, Neuropathy, Confinement Anxiety Confinement Anxiety 03/25/2023 03/25/2023 N/A Date Acquired: 0 0 N/A Weeks of Treatment: Open Open N/A Wound Status: No No N/A Wound Recurrence: 0.3x0.3x0.1  1.8x2.3x0.1 N/A Measurements L x W x D (cm) 0.071 3.252 N/A A (cm) : rea 0.007 0.325 N/A Volume (cm) : Full Thickness Without Exposed Full Thickness Without Exposed N/A Classification: Support Structures Support Structures Medium Medium N/A Exudate Amount: Serous Serous N/A Exudate Type: Media planner N/A Exudate Color: Fibrotic scar, thickened scar Flat and Intact N/A Wound Margin: Large (67-100%) Large (67-100%) N/A Granulation Amount: Red Red N/A Granulation Quality: None Present (0%) None Present (0%) N/A Necrotic Amount: Fat Layer (Subcutaneous Tissue): Yes Fat Layer (Subcutaneous Tissue): Yes N/A Exposed Structures: Fascia: No Fascia: No Tendon: No Tendon: No Muscle: No Muscle: No Joint: No Joint: No Bone: No Bone: No Small (1-33%) Medium (34-66%) N/A Epithelialization: No Abnormalities Noted No Abnormalities Noted N/A Periwound Skin Texture: Dry/Scaly: Yes Dry/Scaly: Yes N/A Periwound Skin Moisture: Maceration: No Hemosiderin Staining: Yes Hemosiderin Staining: Yes N/A Periwound Skin Color: No Abnormality No Abnormality N/A Temperature: Treatment Notes Electronic Signature(s) Signed: 04/15/2023 10:19:23 AM By: Duanne Guess MD FACS Entered By: Duanne Guess on 04/15/2023 10:19:23 -------------------------------------------------------------------------------- Multi-Disciplinary Care Plan Details Patient Name: Date of Service: BEYONKA, HIESTER 04/15/2023 9:00 A M Medical Record Number: [id number] Patient Account Number: [id number] Date of Birth/Sex: Treating RN: October 03, 1940 (82 y.o. [name] Primary Care : Aliene Beams Other Clinician: Referring : Treating /Extender: Karna Dupes in Treatment: 0 Multidisciplinary Care Plan reviewed with physician [address] AVANNAH, GUERRIERO ([id number]) [id number] Page 6 of 11 Abuse / Safety / Falls / Self Care  Management Nursing Diagnoses: Impaired physical mobility Potential for falls Goals: Patient/caregiver will verbalize/demonstrate measures taken to prevent injury and/or falls Date Initiated: 04/15/2023 Target Resolution Date: 05/13/2023 Goal Status: Active Interventions: Assess fall risk on admission and as needed Assess impairment of mobility on admission and as needed per policy Notes: Nutrition Nursing Diagnoses: Impaired glucose control: actual or potential Potential for alteratiion in Nutrition/Potential for imbalanced nutrition Goals: Patient/caregiver will maintain therapeutic glucose control Date Initiated: 04/15/2023 Target Resolution Date: 05/13/2023 Goal Status: Active Interventions: Assess HgA1c results as ordered upon admission and as needed Assess patient nutrition upon admission and as needed per policy Provide education on elevated blood sugars and impact on wound healing Treatment Activities: Patient referred to Primary Care Physician for further nutritional evaluation : 04/15/2023 Notes: Venous Leg Ulcer Nursing Diagnoses: Potential for venous Insuffiency (use before diagnosis confirmed) Goals: Patient will maintain optimal edema control Date Initiated: 04/15/2023 Target Resolution Date: 05/13/2023 Goal Status: Active Interventions: Assess peripheral edema status every visit. Compression as ordered Treatment Activities: Therapeutic compression applied : 04/15/2023 Notes: Wound/Skin Impairment Nursing Diagnoses: Impaired tissue integrity Knowledge deficit related to ulceration/compromised skin integrity Goals: Patient/caregiver will verbalize understanding of skin care regimen Date Initiated: 04/15/2023 Target Resolution Date: 05/13/2023 Goal Status: Active Ulcer/skin breakdown will have a volume  reduction of 30% by week 4 Date Initiated: 04/15/2023 Target Resolution Date: 05/13/2023 Goal Status: Active Interventions: Assess patient/caregiver ability to obtain  necessary supplies Assess patient/caregiver ability to perform ulcer/skin care regimen upon admission and as needed Assess ulceration(s) every visit Provide education on ulcer and skin care Treatment Activities: MELINE, SWIDER ([id number]) [id number] Page 7 of 11 Skin care regimen initiated : 04/15/2023 Topical wound management initiated : 04/15/2023 Notes: Electronic Signature(s) Signed: 04/15/2023 5:40:23 PM By: Zenaida Deed RN, BSN Entered By: Zenaida Deed on 04/15/2023 10:00:11 -------------------------------------------------------------------------------- Pain Assessment Details Patient Name: Date of Service: QUANTIA, LEVI 04/15/2023 9:00 A M Medical Record Number: [id number] Patient Account Number: [id number] Date of Birth/Sex: Treating RN: [birth year] (82 y.o. [name] Primary Care : Aliene Beams Other Clinician: Referring : Treating /Extender: Karna Dupes in Treatment: 0 Active Problems Location of Pain Severity and Description of Pain Patient Has Paino No Site Locations Rate the pain. Current Pain Level: 0 Pain Management and Medication Current Pain Management: Electronic Signature(s) Signed: 04/15/2023 5:40:23 PM By: Zenaida Deed RN, BSN Entered By: Zenaida Deed on 04/15/2023 09:54:29 -------------------------------------------------------------------------------- Patient/Caregiver Education Details Patient Name: Date of Service: HYLTO N, Toiya 8/5/2024andnbsp9:00 A M Medical Record Number: [id number] Patient Account Number: [id number] Date of Birth/Gender: Treating RN: [birth year] (82 y.o. [name] Primary Care Physician: Aliene Beams Other Clinician: Referring Physician: Treating Physician/Extender: Karna Dupes in Treatment: 0 [place], [name] Clos ([id number]) [id number] Page 8 of 11 Education  Assessment Education Provided To: Patient Education Topics Provided Elevated Blood Sugar/ Impact on Healing: Methods: Explain/Verbal, Printed Responses: Reinforcements needed, State content correctly Venous: Handouts: Controlling Swelling with Compression Stockings Methods: Explain/Verbal, Printed Responses: Reinforcements needed, State content correctly Wound/Skin Impairment: Methods: Explain/Verbal Responses: Reinforcements needed, State content correctly Electronic Signature(s) Signed: 04/15/2023 5:40:23 PM By: Zenaida Deed RN, BSN Entered By: Zenaida Deed on 04/15/2023 10:01:23 -------------------------------------------------------------------------------- Wound Assessment Details Patient Name: Date of Service: [name], BENTHAM 04/15/2023 9:00 A M Medical Record Number: [id number] Patient Account Number: [id number] Date of Birth/Sex: Treating RN: 1941/04/10 (82 y.o. Billy Coast, Bonita Quin Primary Care : Aliene Beams Other Clinician: Referring : Treating /Extender: Karna Dupes in Treatment: 0 Wound Status Wound Number: 10 Primary Lymphedema Etiology: Wound Location: Left, Medial Lower Leg Secondary Diabetic Wound/Ulcer of the Lower Extremity Wounding Event: Gradually Appeared Etiology: Date Acquired: 03/25/2023 Wound Open Weeks Of Treatment: 0 Status: Clustered Wound: No Comorbid Lymphedema, Congestive Heart Failure, Hypertension, Peripheral History: Venous Disease, Type II Diabetes, Osteoarthritis, Neuropathy, Confinement Anxiety Photos Wound Measurements Length: (cm) 0.3 Width: (cm) 0.3 Depth: (cm) 0.1 Area: (cm) 0.071 Volume: (cm) 0.007 Muscarella, Asya ([id number]) Wound Description Classification: Full Thickness Without Exposed Suppor Wound Margin: Fibrotic scar, thickened scar Exudate Amount: Medium Exudate Type: Serous Exudate Color: amber t Structures Foul Odor After Cleansing: Slough/Fibrino %  Reduction in Area: % Reduction in Volume: Epithelialization: Small (1-33%) Tunneling: No Undermining: No [id number] Page 9 of 11 No No Wound Bed Granulation Amount: Large (67-100%) Exposed Structure Granulation Quality: Red Fascia Exposed: No Necrotic Amount: None Present (0%) Fat Layer (Subcutaneous Tissue) Exposed: Yes Tendon Exposed: No Muscle Exposed: No Joint Exposed: No Bone Exposed: No Periwound Skin Texture Texture Color No Abnormalities Noted: Yes No Abnormalities Noted: No Hemosiderin Staining: Yes Moisture No Abnormalities Noted: No Temperature / Pain Dry / Scaly: Yes Temperature: No Abnormality Treatment Notes Wound #10 (Lower Leg) Wound Laterality: Left, Medial Cleanser Peri-Wound Care Sween Lotion (Moisturizing lotion) Discharge Instruction: Apply moisturizing lotion as directed Topical Primary  Dressing Maxorb Extra Ag+ Alginate Dressing, 4x4.75 (in/in) Discharge Instruction: Apply to wound bed as instructed Secondary Dressing ABD Pad, 8x10 Discharge Instruction: Apply over primary dressing as directed. Secured With Compression Wrap Kerlix Roll 4.5x3.1 (in/yd) Discharge Instruction: Apply Kerlix and Coban compression as directed. Coban Self-Adherent Wrap 4x5 (in/yd) Discharge Instruction: Apply over Kerlix as directed. Compression Stockings Add-Ons Electronic Signature(s) Signed: 04/15/2023 5:40:23 PM By: Zenaida Deed RN, BSN Entered By: Zenaida Deed on 04/15/2023 09:48:47 -------------------------------------------------------------------------------- Wound Assessment Details Patient Name: Date of Service: [name], [name] 04/15/2023 9:00 A M Medical Record Number: [id number] Patient Account Number: [id number] Date of Birth/Sex: Treating RN: 07/31/1941 (82 y.o. [name] Primary Care : Aliene Beams Other Clinician: Referring : Treating /Extender: Karna Dupes in Treatment: 0 [place], [name] Clos ([id number]) [id number] Page 10 of 11 Wound Status Wound Number: 11 Primary Lymphedema Etiology: Wound Location: Right, Lateral Lower Leg Secondary Diabetic Wound/Ulcer of the Lower Extremity Wounding Event: Gradually Appeared Etiology: Date Acquired: 03/25/2023 Wound Open Weeks Of Treatment: 0 Status: Clustered Wound: No Comorbid Lymphedema, Congestive Heart Failure, Hypertension, Peripheral History: Venous Disease, Type II Diabetes, Osteoarthritis, Neuropathy, Confinement Anxiety Photos Wound Measurements Length: (cm) Width: (cm) Depth: (cm) Area: (cm) Volume: (cm) 1.8 % Reduction in Area: 2.3 % Reduction in Volume: 0.1 Epithelialization: Medium (34-66%) 3.252 Tunneling: No 0.325 Undermining: No Wound Description Classification: Full Thickness Without Exposed Suppor Wound Margin: Flat and Intact Exudate Amount: Medium Exudate Type: Serous Exudate Color: amber t Structures Foul Odor After Cleansing: No Slough/Fibrino No Wound Bed Granulation Amount: Large (67-100%) Exposed Structure Granulation Quality: Red Fascia Exposed: No Necrotic Amount: None Present (0%) Fat Layer (Subcutaneous Tissue) Exposed: Yes Tendon Exposed: No Muscle Exposed: No Joint Exposed: No Bone Exposed: No Periwound Skin Texture Texture Color No Abnormalities Noted: Yes No Abnormalities Noted: No Hemosiderin Staining: Yes Moisture No Abnormalities Noted: No Temperature / Pain Dry / Scaly: Yes Temperature: No Abnormality Maceration: No Treatment Notes Wound #11 (Lower Leg) Wound Laterality: Right, Lateral Cleanser Peri-Wound Care Sween Lotion (Moisturizing lotion) Discharge Instruction: Apply moisturizing lotion as directed Topical Primary Dressing Maxorb Extra Ag+ Alginate Dressing, 4x4.75 (in/in) Discharge Instruction: Apply to wound bed as instructed Secondary Dressing [name], PEREZHERNANDEZ ([id number])  [id number] Page 11 of 11 ABD Pad, 8x10 Discharge Instruction: Apply over primary dressing as directed. Secured With Compression Wrap Kerlix Roll 4.5x3.1 (in/yd) Discharge Instruction: Apply Kerlix and Coban compression as directed. Coban Self-Adherent Wrap 4x5 (in/yd) Discharge Instruction: Apply over Kerlix as directed. Compression Stockings Add-Ons Electronic Signature(s) Signed: 04/15/2023 5:40:23 PM By: Zenaida Deed RN, BSN Entered By: Zenaida Deed on 04/15/2023 09:51:28 -------------------------------------------------------------------------------- Vitals Details Patient Name: Date of Service: [name] Haggard, MIMIE 04/15/2023 9:00 A M Medical Record Number: [id number] Patient Account Number: [id number] Date of Birth/Sex: Treating RN: [birth year] (82 y.o. [name] Primary Care : Aliene Beams Other Clinician: Referring : Treating /Extender: Karna Dupes in Treatment: 0 Vital Signs Time Taken: 09:22 Temperature (F): 98.1 Pulse (bpm): 80 Respiratory Rate (breaths/min): 20 Blood Pressure (mmHg): 169/73 Capillary Blood Glucose (mg/dl): 188 Reference Range: 80 - 120 mg / dl Notes glucose per pt report this am Electronic Signature(s) Signed: 04/15/2023 5:40:23 PM By: Zenaida Deed RN, BSN Entered By: Zenaida Deed on 04/15/2023 09:24:02

## 2023-04-15 NOTE — Progress Notes (Addendum)
RAYNIA, NISWONGER ([id number]) [id number] Page 1 of 11 Visit Report for 04/15/2023 Chief Complaint Document Details Patient Name: Date of Service: [name], STPETER 04/15/2023 9:00 A M Medical Record Number: [id number] Patient Account Number: [id number] Date of Birth/Sex: Treating RN: May 29, 1941 (82 y.o. F) Primary Care Provider: Aliene Beams Other Clinician: Referring Provider: Treating Provider/Extender: Karna Dupes in Treatment: 0 Information Obtained from: Patient Chief Complaint 03/28/2020; patient is here for review of wounds on her bilateral lower legs 12/25/2022; bilateral lower extremity wound 04/15/2023: BLE wounds Electronic Signature(s) Signed: 04/15/2023 10:19:46 AM By: Duanne Guess MD FACS Entered By: Duanne Guess on 04/15/2023 10:19:46 -------------------------------------------------------------------------------- HPI Details Patient Name: Date of Service: [name], [name] 04/15/2023 9:00 A M Medical Record Number: [id number] Patient Account Number: [id number] Date of Birth/Sex: Treating RN: 08-31-1941 (82 y.o. F) Primary Care Provider: Aliene Beams Other Clinician: Referring Provider: Treating Provider/Extender: Karna Dupes in Treatment: 0 History of Present Illness HPI Description: ADMISSION 03/28/2020 This is a 82 year old woman who is accompanied by her daughter. She is moving to [place] from [place] where she lives. Infectious been going to the wound care center in [place] for the last 2 months. She apparently did not tolerate compression early on in that clinic stay. She is just using dry gauze over the wounds when she came in today. She tells me she has had both arterial and venous reflux studies although she does not have copies of these. She was apparently offered an ablation but I have no further information on that. She also is a type II diabetic. She was noted to have  wounds on her lower extremities during a CHF clinic visit with Dr. Gala Romney and she was referred here. She has several scattered areas on the right medial lower leg and a large area of superficial ulceration on the posterior medial left lower leg. past medical history; chronic kidney disease stage III, gastroesophageal reflux disease, type 2 diabetes with neuropathy, chronic diastolic heart failure, obstructive sleep apnea, hypertension, restless leg syndrome, pulmonary venous hypertension, history of colon CA, pacemaker and apparently venous reflux. We did not do arterial studies in the clinic today we are going to try to get the results that were already done within the last 2 months in [place] 7/26; patient readmitted to the clinic last week with predominantly chronic venous wounds almost circumferentially in the left lower leg and the right leg medially. She also has secondary lymphedema. We put her in compression. She went to the ER in [place] on Friday they remove the wrap on the left leg diagnosed her with cellulitis and put her on doxycycline. They apparently also did a ultrasound that was negative for DVT . We did not get any vascular information from [facility] where she apparently had arterial studies and venous studies. She is currently moving from [place] to [place] is up on her feet quite a bit. She was referred to vein and vascular here but never got to the appointment. ABIs were obtained here at 0.89 on the right and 1.02 on the left 8/9-Patient returns after establishing in clinic last visit, we have been doing 3 layer compression on both sides with calcium alginate, she is following up with the vein and vascular when she gets an appointment. The right side is healed the left side is still got some small open areas She has appt on Wed at Vein and vascular at which point she will come in for Nurse visit here ELLISEN, VIVIRITO ([id number])  [phone].pdf Page 2 of 11 8/16; the patient arrives with all of her wounds closed. She has bilateral Juzo stockings. She saw Dr. Darrick Penna on 8/11 to review her reflux studies from the same day. On the right she had no evidence of DVT or SVT She did have deep venous reflux in the common femoral vein as well as superficial venous reflux in the . small saphenous vein of the mid calf on the left again no thrombus in the deep vein or superficial veins. Deep vein reflux in the common femoral vein as well as the great saphenous vein. She saw Dr. Darrick Penna in consult. Also of note he noted that she had a normal arterial scan done in [place] in April 2021, we were never able to obtain this. His feeling was that the patient did have a dilated greater saphenous vein however minimal to no evidence of reflux in her superficial venous system. She also was felt to have mild deep vein reflux and some degree of lymphedema. He recommended continued compression of the lower extremities with intermittent Unna boots if necessary. She was told to elevate her legs. If she develops recurrent ulcerations they were not opposed to revisiting the repeat venous reflux exam to see if she develops worsening reflux for consideration of laser ablation READMISSION 07/26/2020 Patient is now an 82 year old woman who lives in [place]. We had her in the clinic in the summer with wounds on her bilateral lower legs secondary to chronic venous insufficiency with some degree of lymphedema. She was seen by Dr. Darrick Penna in August. This was predominantly to review her venous reflux. She quoted normal arterial studies done in [place] in April 2021 but I have never been able to see these myself.Marland Kitchen His feeling was the patient did have a bilateral great dilated greater saphenous vein however minimal to no evidence of reflux in her superficial venous system. She was also felt to have mild deep vein reflux and some degree of  lymphedema. He recommended compression and leg elevation.Marland Kitchen He was not opposed to repeating her venous reflux studies at some point if she develops worse to see if she develops worsening reflux for consideration of ablation. We discharged the patient with juxta lite stockings bilaterally and she has been compliant with wearing these. Unfortunately in September she had a fall suffering a right tibial fracture requiring an IM nail with a rod. She also had fibular fractures in at least one place. She was sent to skilled facility for rehabilitation she is now back at home. She had a DVT rule out study on 9/24 that was negative for DVT . She tells [place] that in the last 2 or 3 weeks she is developed blisters on her right lower extremity that ruptured into 2 open wounds. She has not been able to get these to close over. She religiously uses her juxta lite stockings on both legs and she gets them on is tight as she can. Originally the left leg was the larger of the 2 legs but since the patient had surgery the right leg is more swollen and tense than the left. She also tells me she had a second fall yesterday and has had pain in the left dorsal midfoot. She has diabetic neuropathy with Charcot foot left greater than right. Past medical history includes chronic kidney disease, hypertension, type 2 diabetes with peripheral neuropathy, congestive heart failure, hypertension, history of colon CA, lumbar stenosis ABIs in our clinic were noncompressible bilaterally 11/30; the wounds in the right lower  extremity are healed anteriorly. She has both Tubigrip and her juxta lite stockings she uses the Tubigrip as the primary layer. We went over this with her today she has the bilateral stockings. She will need to adjust her compression to roughly 30 mmHg 12/25/2022 Mrs. Auden Meuser is a 82 year old female with a past medical history Of CKD stage IV, lymphedema/venous insufficiency, controlled type 2 diabetes  that presents the clinic for a 32-month history of bilateral lower extremity wounds. She has difficulty controlling the edema and is currently on metolazone 2.5 mg once weekly and torsemide 40 mg daily. She sleeps in a recliner. She has been using antibiotic ointment to the wound beds. She states she has Juxta lite compressions which she uses daily. She currently denies signs of infection. 4/23; patient presents for follow-up. We have been using Santyl and Hydrofera Blue under Kerlix/Coban to the lower extremities bilaterally. She tolerated this well. 4/30; patient presents for follow-up. We have been using silver alginate under 3 layer compression. Patient did not tolerate the wraps and took these off last week. She has significant swelling on exam. She does report that her cardiologist recommended increasing the diuretic regimen at one point to help with her lower extremity edema however she did not do this. 5/6; we have been using silver alginate and Coflex Unna boot. She was able to tolerate this. She does not tolerate tight compression well. She has wounds on her bilateral lower extremities some of which are measuring smaller the area on the left medial is unchanged. The patient sleeps in her recliner tells me that she is not able to elevate her legs much because of musculoskeletal pain. She has a history of having external compression stockings but they are old and will probably need a new pair she asked [place] to order them for her room ALT I told her we do so when she is close to healing 5/21; patient presents for follow-up. She missed her last clinic appointment due to not feeling well. She took off the compression wraps and has been keeping the areas covered and using compression stockings. She did tolerate the 3 layer compression wrap well when placed during the nurse visit on 5/7. She has a new wound to the lateral left leg. She denies signs of infection. READMISSION 04/15/2023: She returns  to clinic today with new bilateral lower extremity wounds. She says that her legs started weeping and her daughter applied Neosporin, Kerlix and Coban. This seemed to help the swelling tremendously but she still has small open areas on her legs. Her Fabian November wraps are old and stretched out and not providing adequate compression at this time. Electronic Signature(s) Signed: 04/15/2023 10:21:09 AM By: Duanne Guess MD FACS Entered By: Duanne Guess on 04/15/2023 10:21:09 -------------------------------------------------------------------------------- Physical Exam Details Patient Name: Date of Service: KARMANN, BLAUSTEIN 04/15/2023 9:00 A M Medical Record Number: [id number] Patient Account Number: [id number] Date of Birth/Sex: Treating RN: 12-07-1940 (82 y.o. F) Primary Care Provider: Aliene Beams Other Clinician: Referring Provider: Treating Provider/Extender: Karna Dupes in Treatment: 0 [place], Bosie Clos ([id number]) [id number] Page 3 of 11 Constitutional Hypertensive, asymptomatic. . . . No acute distress. Respiratory Normal work of breathing on room air. Notes 04/15/2023: There are multiple tiny open areas on her left anterior tibial surface and an area on her right lateral leg and right medial ankle. They are all quite superficial and clean. Electronic Signature(s) Signed: 04/15/2023 10:21:59 AM By: Duanne Guess MD FACS Entered By: Duanne Guess on 04/15/2023 10:21:59 --------------------------------------------------------------------------------  Physician Orders Details Patient Name: Date of Service: LILIAN, [name] 04/15/2023 9:00 A M Medical Record Number: [id number] Patient Account Number: [id number] Date of Birth/Sex: Treating RN: 1941/02/25 (82 y.o. [name] Primary Care Provider: Aliene Beams Other Clinician: Referring Provider: Treating Provider/Extender: Karna Dupes in Treatment:  0 Verbal / Phone Orders: No Diagnosis Coding ICD-10 Coding Code Description L97.811 Non-pressure chronic ulcer of other part of right lower leg limited to breakdown of skin L97.821 Non-pressure chronic ulcer of other part of left lower leg limited to breakdown of skin I89.0 Lymphedema, not elsewhere classified I87.2 Venous insufficiency (chronic) (peripheral) E11.622 Type 2 diabetes mellitus with other skin ulcer I50.9 Heart failure, unspecified N18.4 Chronic kidney disease, stage 4 (severe) Follow-up Appointments ppointment in 1 week. - Dr. Lady Gary RM 1 Return A Tuesday 8/20 @ 2:45 pm Nurse Visit: - Dr. Lady Gary RM 6 Tuesday 8/13 @ 10:15 am Bathing/ Shower/ Hygiene May shower with protection but do not get wound dressing(s) wet. Protect dressing(s) with water repellant cover (for example, large plastic bag) or a cast cover and may then take shower. Edema Control - Lymphedema / SCD / Other Elevate legs to the level of the heart or above for 30 minutes daily and/or when sitting for 3-4 times a day throughout the day. Avoid standing for long periods of time. Exercise regularly Wound Treatment Wound #10 - Lower Leg Wound Laterality: Left, Medial Peri-Wound Care: Sween Lotion (Moisturizing lotion) 1 x Per Week/30 Days Discharge Instructions: Apply moisturizing lotion as directed Prim Dressing: Maxorb Extra Ag+ Alginate Dressing, 4x4.75 (in/in) 1 x Per Week/30 Days ary Discharge Instructions: Apply to wound bed as instructed Secondary Dressing: ABD Pad, 8x10 1 x Per Week/30 Days Discharge Instructions: Apply over primary dressing as directed. Compression Wrap: Kerlix Roll 4.5x3.1 (in/yd) 1 x Per Week/30 Days Discharge Instructions: Apply Kerlix and Coban compression as directed. Compression Wrap: Coban Self-Adherent Wrap 4x5 (in/yd) 1 x Per Week/30 Days Thuman, Fatina ([id number]) [id number] Page 4 of 11 Discharge Instructions: Apply over Kerlix as  directed. Wound #11 - Lower Leg Wound Laterality: Right, Lateral Peri-Wound Care: Sween Lotion (Moisturizing lotion) 1 x Per Week/30 Days Discharge Instructions: Apply moisturizing lotion as directed Prim Dressing: Maxorb Extra Ag+ Alginate Dressing, 4x4.75 (in/in) 1 x Per Week/30 Days ary Discharge Instructions: Apply to wound bed as instructed Secondary Dressing: ABD Pad, 8x10 1 x Per Week/30 Days Discharge Instructions: Apply over primary dressing as directed. Compression Wrap: Kerlix Roll 4.5x3.1 (in/yd) 1 x Per Week/30 Days Discharge Instructions: Apply Kerlix and Coban compression as directed. Compression Wrap: Coban Self-Adherent Wrap 4x5 (in/yd) 1 x Per Week/30 Days Discharge Instructions: Apply over Kerlix as directed. Electronic Signature(s) Signed: 04/15/2023 12:37:08 PM By: Duanne Guess MD FACS Entered By: Duanne Guess on 04/15/2023 10:23:24 -------------------------------------------------------------------------------- Problem List Details Patient Name: Date of Service: [name], [name] 04/15/2023 9:00 A M Medical Record Number: [id number] Patient Account Number: [id number] Date of Birth/Sex: Treating RN: [birth year] (82 y.o. F) Primary Care Provider: Aliene Beams Other Clinician: Referring Provider: Treating Provider/Extender: Karna Dupes in Treatment: 0 Active Problems ICD-10 Encounter Code Description Active Date MDM Diagnosis L97.811 Non-pressure chronic ulcer of other part of right lower leg limited to breakdown 04/15/2023 No Yes of skin L97.821 Non-pressure chronic ulcer of other part of left lower leg limited to breakdown 04/15/2023 No Yes of skin I89.0 Lymphedema, not elsewhere classified 04/15/2023 No Yes I87.2 Venous insufficiency (chronic) (peripheral) 04/15/2023 No Yes E11.622 Type 2 diabetes mellitus with other skin  ulcer 04/15/2023 No Yes I50.9 Heart failure, unspecified 04/15/2023 No Yes N18.4 Chronic kidney disease, stage 4  (severe) 04/15/2023 No Yes Kock, Mei ([id number]) [id number] Page 5 of 11 Inactive Problems Resolved Problems Electronic Signature(s) Signed: 04/15/2023 10:16:50 AM By: Duanne Guess MD FACS Previous Signature: 04/15/2023 9:22:55 AM Version By: Duanne Guess MD FACS Entered By: Duanne Guess on 04/15/2023 10:16:49 -------------------------------------------------------------------------------- Progress Note Details Patient Name: Date of Service: [name], UNGERMAN 04/15/2023 9:00 A M Medical Record Number: [id number] Patient Account Number: [id number] Date of Birth/Sex: Treating RN: 1941-03-04 (82 y.o. F) Primary Care Provider: Aliene Beams Other Clinician: Referring Provider: Treating Provider/Extender: Karna Dupes in Treatment: 0 Subjective Chief Complaint Information obtained from Patient 03/28/2020; patient is here for review of wounds on her bilateral lower legs 12/25/2022; bilateral lower extremity wound 04/15/2023: BLE wounds History of Present Illness (HPI) ADMISSION 03/28/2020 This is a 82 year old woman who is accompanied by her daughter. She is moving to [place] from [place] where she lives. Infectious been going to the wound care center in [place] for the last 2 months. She apparently did not tolerate compression early on in that clinic stay. She is just using dry gauze over the wounds when she came in today. She tells me she has had both arterial and venous reflux studies although she does not have copies of these. She was apparently offered an ablation but I have no further information on that. She also is a type II diabetic. She was noted to have wounds on her lower extremities during a CHF clinic visit with Dr. Gala Romney and she was referred here. She has several scattered areas on the right medial lower leg and a large area of superficial ulceration on the posterior medial left lower leg. past medical  history; chronic kidney disease stage III, gastroesophageal reflux disease, type 2 diabetes with neuropathy, chronic diastolic heart failure, obstructive sleep apnea, hypertension, restless leg syndrome, pulmonary venous hypertension, history of colon CA, pacemaker and apparently venous reflux. We did not do arterial studies in the clinic today we are going to try to get the results that were already done within the last 2 months in [place] 7/26; patient readmitted to the clinic last week with predominantly chronic venous wounds almost circumferentially in the left lower leg and the right leg medially. She also has secondary lymphedema. We put her in compression. She went to the ER in [place] on Friday they remove the wrap on the left leg diagnosed her with cellulitis and put her on doxycycline. They apparently also did a ultrasound that was negative for DVT . We did not get any vascular information from [facility] where she apparently had arterial studies and venous studies. She is currently moving from [place] to [place] is up on her feet quite a bit. She was referred to vein and vascular here but never got to the appointment. ABIs were obtained here at 0.89 on the right and 1.02 on the left 8/9-Patient returns after establishing in clinic last visit, we have been doing 3 layer compression on both sides with calcium alginate, she is following up with the vein and vascular when she gets an appointment. The right side is healed the left side is still got some small open areas She has appt on Wed at Vein and vascular at which point she will come in for Nurse visit here 8/16; the patient arrives with all of her wounds closed. She has bilateral Juzo stockings. She saw Dr. Darrick Penna on 8/11 to  review her reflux studies from the same day. On the right she had no evidence of DVT or SVT She did have deep venous reflux in the common femoral vein as well as superficial venous reflux in the . small  saphenous vein of the mid calf on the left again no thrombus in the deep vein or superficial veins. Deep vein reflux in the common femoral vein as well as the great saphenous vein. She saw Dr. Darrick Penna in consult. Also of note he noted that she had a normal arterial scan done in [place] in April 2021, we were never able to obtain this. His feeling was that the patient did have a dilated greater saphenous vein however minimal to no evidence of reflux in her superficial venous system. She also was felt to have mild deep vein reflux and some degree of lymphedema. He recommended continued compression of the lower extremities with intermittent Unna boots if necessary. She was told to elevate her legs. If she develops recurrent ulcerations they were not opposed to revisiting the repeat venous reflux exam to see if she develops worsening reflux for consideration of laser ablation READMISSION 07/26/2020 Patient is now an 82 year old woman who lives in [place]. We had her in the clinic in the summer with wounds on her bilateral lower legs secondary to chronic venous insufficiency with some degree of lymphedema. She was seen by Dr. Darrick Penna in August. This was predominantly to review her venous reflux. She quoted normal arterial studies done in [place] in April 2021 but I have never been able to see these myself.Marland Kitchen His feeling was the patient did have a bilateral great dilated greater saphenous vein however minimal to no evidence of reflux in her superficial venous system. She was also felt to have mild deep vein reflux and some degree of lymphedema. He recommended compression and leg elevation.Marland Kitchen He was not opposed to repeating her venous reflux studies at some point if she develops worse to see if she develops worsening reflux for consideration of ablation. GABREAL, MONNOT ([id number]) [id number] Page 6 of 11 We discharged the patient with juxta lite stockings bilaterally and she has  been compliant with wearing these. Unfortunately in September she had a fall suffering a right tibial fracture requiring an IM nail with a rod. She also had fibular fractures in at least one place. She was sent to skilled facility for rehabilitation she is now back at home. She had a DVT rule out study on 9/24 that was negative for DVT . She tells [place] that in the last 2 or 3 weeks she is developed blisters on her right lower extremity that ruptured into 2 open wounds. She has not been able to get these to close over. She religiously uses her juxta lite stockings on both legs and she gets them on is tight as she can. Originally the left leg was the larger of the 2 legs but since the patient had surgery the right leg is more swollen and tense than the left. She also tells me she had a second fall yesterday and has had pain in the left dorsal midfoot. She has diabetic neuropathy with Charcot foot left greater than right. Past medical history includes chronic kidney disease, hypertension, type 2 diabetes with peripheral neuropathy, congestive heart failure, hypertension, history of colon CA, lumbar stenosis ABIs in our clinic were noncompressible bilaterally 11/30; the wounds in the right lower extremity are healed anteriorly. She has both Tubigrip and her juxta lite stockings she uses the Tubigrip as  the primary layer. We went over this with her today she has the bilateral stockings. She will need to adjust her compression to roughly 30 mmHg 12/25/2022 Mrs. Astin Waggoner is a 82 year old female with a past medical history Of CKD stage IV, lymphedema/venous insufficiency, controlled type 2 diabetes that presents the clinic for a 76-month history of bilateral lower extremity wounds. She has difficulty controlling the edema and is currently on metolazone 2.5 mg once weekly and torsemide 40 mg daily. She sleeps in a recliner. She has been using antibiotic ointment to the wound beds. She states she has Juxta  lite compressions which she uses daily. She currently denies signs of infection. 4/23; patient presents for follow-up. We have been using Santyl and Hydrofera Blue under Kerlix/Coban to the lower extremities bilaterally. She tolerated this well. 4/30; patient presents for follow-up. We have been using silver alginate under 3 layer compression. Patient did not tolerate the wraps and took these off last week. She has significant swelling on exam. She does report that her cardiologist recommended increasing the diuretic regimen at one point to help with her lower extremity edema however she did not do this. 5/6; we have been using silver alginate and Coflex Unna boot. She was able to tolerate this. She does not tolerate tight compression well. She has wounds on her bilateral lower extremities some of which are measuring smaller the area on the left medial is unchanged. The patient sleeps in her recliner tells me that she is not able to elevate her legs much because of musculoskeletal pain. She has a history of having external compression stockings but they are old and will probably need a new pair she asked [place] to order them for her room ALT I told her we do so when she is close to healing 5/21; patient presents for follow-up. She missed her last clinic appointment due to not feeling well. She took off the compression wraps and has been keeping the areas covered and using compression stockings. She did tolerate the 3 layer compression wrap well when placed during the nurse visit on 5/7. She has a new wound to the lateral left leg. She denies signs of infection. READMISSION 04/15/2023: She returns to clinic today with new bilateral lower extremity wounds. She says that her legs started weeping and her daughter applied Neosporin, Kerlix and Coban. This seemed to help the swelling tremendously but she still has small open areas on her legs. Her Fabian November wraps are old and stretched out and not providing  adequate compression at this time. Patient History Information obtained from Patient. Allergies penicillin, codeine, adhesive, aspirin, iodine, Iodinated Contrast Media, Sulfa (Sulfonamide Antibiotics) Family History Diabetes - Siblings, Heart Disease - Mother, Hypertension - Mother, Kidney Disease - Siblings, No family history of Cancer, Hereditary Spherocytosis, Lung Disease, Seizures, Stroke, Thyroid Problems, Tuberculosis. Social History Never smoker, Marital Status - Widowed, Alcohol Use - Rarely, Drug Use - No History, Caffeine Use - Daily. Medical History Eyes Denies history of Cataracts, Glaucoma, Optic Neuritis Ear/Nose/Mouth/Throat Denies history of Chronic sinus problems/congestion, Middle ear problems Hematologic/Lymphatic Patient has history of Lymphedema Denies history of Anemia, Hemophilia, Human Immunodeficiency Virus, Sickle Cell Disease Respiratory Denies history of Aspiration, Asthma, Chronic Obstructive Pulmonary Disease (COPD), Pneumothorax, Sleep Apnea, Tuberculosis Cardiovascular Patient has history of Congestive Heart Failure, Hypertension, Peripheral Venous Disease Denies history of Angina, Arrhythmia, Coronary Artery Disease, Deep Vein Thrombosis, Hypotension, Myocardial Infarction, Peripheral Arterial Disease, Phlebitis, Vasculitis Gastrointestinal Denies history of Cirrhosis , Colitis, Crohns, Hepatitis A, Hepatitis B,  Hepatitis C Endocrine Patient has history of Type II Diabetes Denies history of Type I Diabetes Genitourinary Denies history of End Stage Renal Disease Immunological Denies history of Lupus Erythematosus, Raynauds, Scleroderma Integumentary (Skin) Denies history of History of Burn Musculoskeletal Patient has history of Osteoarthritis Neurologic Patient has history of Neuropathy Denies history of Dementia, Quadriplegia, Paraplegia, Seizure Disorder Houchin, Brynnly ([id number]) [id number] Page 7 of  11 Oncologic Denies history of Received Chemotherapy, Received Radiation Psychiatric Patient has history of Confinement Anxiety Denies history of Anorexia/bulimia Hospitalization/Surgery History - tibia nail insertion right. - cardiac cath. - lumbar laminectomy. - pacemaker implanted. - appendectomy. - lumpectomy left breast. - DandC uterus. - partial nephrectomy. - tosillectomy. - tubal ligation. Medical A Surgical History Notes nd Constitutional Symptoms (General Health) obesity Cardiovascular dyslipidemia, pacemaker Gastrointestinal GERD Endocrine thyroid nodule Genitourinary CKD stage 3 Musculoskeletal fibromyalgia, lumbar spinal stenosis Oncologic hx colon CA Review of Systems (ROS) Constitutional Symptoms (General Health) Denies complaints or symptoms of Fatigue, Fever, Chills, Marked Weight Change. Eyes Complains or has symptoms of Glasses / Contacts. Denies complaints or symptoms of Dry Eyes, Vision Changes. Ear/Nose/Mouth/Throat Denies complaints or symptoms of Chronic sinus problems or rhinitis. Respiratory Complains or has symptoms of Shortness of Breath - reports worsening. Denies complaints or symptoms of Chronic or frequent coughs. Cardiovascular Denies complaints or symptoms of Chest pain. Gastrointestinal Denies complaints or symptoms of Frequent diarrhea, Nausea, Vomiting. Endocrine Denies complaints or symptoms of Heat/cold intolerance. Genitourinary Denies complaints or symptoms of Frequent urination. Integumentary (Skin) Complains or has symptoms of Wounds - bil lower legs. Musculoskeletal Denies complaints or symptoms of Muscle Pain, Muscle Weakness. Neurologic Complains or has symptoms of Numbness/parasthesias. Psychiatric Complains or has symptoms of Claustrophobia. Objective Constitutional Hypertensive, asymptomatic. No acute distress. Vitals Time Taken: 9:22 AM, Temperature: 98.1 F, Pulse: 80 bpm, Respiratory Rate: 20 breaths/min, Blood  Pressure: 169/73 mmHg, Capillary Blood Glucose: 156 mg/dl. General Notes: glucose per pt report this am Respiratory Normal work of breathing on room air. General Notes: 04/15/2023: There are multiple tiny open areas on her left anterior tibial surface and an area on her right lateral leg and right medial ankle. They are all quite superficial and clean. Integumentary (Hair, Skin) Wound #10 status is Open. Original cause of wound was Gradually Appeared. The date acquired was: 03/25/2023. The wound is located on the Left,Medial Lower Leg. The wound measures 0.3cm length x 0.3cm width x 0.1cm depth; 0.071cm^2 area and 0.007cm^3 volume. There is Fat Layer (Subcutaneous Tissue) exposed. There is no tunneling or undermining noted. There is a medium amount of serous drainage noted. The wound margin is fibrotic, thickened scar. There is large (67-100%) red granulation within the wound bed. There is no necrotic tissue within the wound bed. The periwound skin appearance had no abnormalities noted for texture. The periwound skin appearance exhibited: Dry/Scaly, Hemosiderin Staining. Periwound temperature was noted as No Abnormality. Wound #11 status is Open. Original cause of wound was Gradually Appeared. The date acquired was: 03/25/2023. The wound is located on the Right,Lateral Lower Leg. The wound measures 1.8cm length x 2.3cm width x 0.1cm depth; 3.252cm^2 area and 0.325cm^3 volume. There is Fat Layer (Subcutaneous Tissue) exposed. There is no tunneling or undermining noted. There is a medium amount of serous drainage noted. The wound margin is flat and intact. There is large (67-100%) red granulation within the wound bed. There is no necrotic tissue within the wound bed. The periwound skin appearance had no abnormalities noted for texture. The periwound skin appearance exhibited: Dry/Scaly,  Hemosiderin Staining. The periwound skin appearance did not exhibit: Maceration. Periwound temperature was noted as No  Abnormality. 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 - Dr. Lady Gary RM 1 Tuesday 8/20 @ 2:45 pm Nurse Visit: - Dr. Lady Gary RM 6 Tuesday 8/13 @ 10:15 am Bathing/ Shower/ Hygiene: May shower with protection but do not get wound dressing(s) wet. Protect dressing(s) with water repellant cover (for example, large plastic bag) or a cast cover and may then take shower. Edema Control - Lymphedema / SCD / Other: Elevate legs to the level of the heart or above for 30 minutes daily and/or when sitting for 3-4 times a day throughout the day. Avoid standing for long periods of time. Exercise regularly WOUND #10: - Lower Leg Wound Laterality: Left, Medial Peri-Wound Care: Sween Lotion (Moisturizing lotion) 1 x Per Week/30 Days Discharge Instructions: Apply moisturizing lotion as directed Prim Dressing: Maxorb Extra Ag+ Alginate Dressing, 4x4.75 (in/in) 1 x Per Week/30 Days ary Discharge Instructions: Apply to wound bed as instructed Secondary Dressing: ABD Pad, 8x10 1 x Per Week/30 Days Discharge Instructions: Apply over primary dressing as directed. Com pression Wrap: Kerlix Roll 4.5x3.1 (in/yd) 1 x Per Week/30 Days Discharge Instructions: Apply Kerlix and Coban compression as directed. Com pression Wrap: Coban Self-Adherent Wrap 4x5 (in/yd) 1 x Per Week/30 Days Discharge Instructions: Apply over Kerlix as directed. WOUND #11: - Lower Leg Wound Laterality: Right, Lateral Peri-Wound Care: Sween Lotion  (Moisturizing lotion) 1 x Per Week/30 Days Discharge Instructions: Apply moisturizing lotion as directed Prim Dressing: Maxorb Extra Ag+ Alginate Dressing, 4x4.75 (in/in) 1 x Per Week/30 Days ary Discharge Instructions: Apply to wound bed as instructed Secondary Dressing: ABD Pad, 8x10 1 x Per Week/30 Days Discharge Instructions: Apply over primary dressing as directed. Com pression Wrap: Kerlix Roll 4.5x3.1 (in/yd) 1 x Per Week/30 Days Discharge Instructions: Apply Kerlix and Coban compression as directed. Com pression Wrap: Coban Self-Adherent Wrap 4x5 (in/yd) 1 x Per Week/30 Days Discharge Instructions: Apply over Kerlix as directed. 04/15/2023: She returns today with new bilateral leg wounds. There are multiple tiny open areas on her left anterior tibial surface and an area on her right lateral leg and right medial ankle. They are all quite superficial and clean. Her wounds did not require debridement. Will apply silver alginate. She does not tolerate compression well so we will just use Kerlix and Coban. We will try to get her enrolled in the lymphedema program through [facility] so that she can get new wraps. She will have a nurse visit next week to have her wrap changed and follow-up with me in 2 weeks. Electronic Signature(s) Signed: 04/15/2023 10:24:26 AM By: Duanne Guess MD FACS Entered By: Duanne Guess on 04/15/2023 10:24:26 -------------------------------------------------------------------------------- HxROS Details Patient Name: Date of Service: [name], [name] 04/15/2023 9:00 A M Medical Record Number: [id number] Patient Account Number: [id number] VERMELL, COLLUM ([id number]) [id number] Page 9 of 11 Date of Birth/Sex: Treating RN: 02-Jun-1941 (82 y.o. [name] Primary Care Provider: Other Clinician: Aliene Beams Referring Provider: Treating Provider/Extender: Karna Dupes in Treatment: 0 Information Obtained  From Patient Constitutional Symptoms (  General Health) Complaints and Symptoms: Negative for: Fatigue; Fever; Chills; Marked Weight Change Medical History: Past Medical History Notes: obesity Eyes Complaints and Symptoms: Positive for: Glasses / Contacts Negative for: Dry Eyes; Vision Changes Medical History: Negative for: Cataracts; Glaucoma; Optic Neuritis Ear/Nose/Mouth/Throat Complaints and Symptoms: Negative for: Chronic sinus problems or rhinitis Medical History: Negative for: Chronic sinus problems/congestion; Middle ear problems Respiratory Complaints and Symptoms: Positive for: Shortness of Breath - reports worsening Negative for: Chronic or frequent coughs Medical History: Negative for: Aspiration; Asthma; Chronic Obstructive Pulmonary Disease (COPD); Pneumothorax; Sleep Apnea; Tuberculosis Cardiovascular Complaints and Symptoms: Negative for: Chest pain Medical History: Positive for: Congestive Heart Failure; Hypertension; Peripheral Venous Disease Negative for: Angina; Arrhythmia; Coronary Artery Disease; Deep Vein Thrombosis; Hypotension; Myocardial Infarction; Peripheral Arterial Disease; Phlebitis; Vasculitis Past Medical History Notes: dyslipidemia, pacemaker Gastrointestinal Complaints and Symptoms: Negative for: Frequent diarrhea; Nausea; Vomiting Medical History: Negative for: Cirrhosis ; Colitis; Crohns; Hepatitis A; Hepatitis B; Hepatitis C Past Medical History Notes: GERD Endocrine Complaints and Symptoms: Negative for: Heat/cold intolerance Medical History: Positive for: Type II Diabetes Negative for: Type I Diabetes Past Medical History Notes: thyroid nodule Time with diabetes: 15 Treated with: Insulin Blood sugar tested every day: Yes Tested : 2-3 times per day Genitourinary Complaints and Symptoms: Negative for: Frequent urination Cratty, Bosie Clos ([id number]) [id number] Page 10 of 11 Medical  History: Negative for: End Stage Renal Disease Past Medical History Notes: CKD stage 3 Integumentary (Skin) Complaints and Symptoms: Positive for: Wounds - bil lower legs Medical History: Negative for: History of Burn Musculoskeletal Complaints and Symptoms: Negative for: Muscle Pain; Muscle Weakness Medical History: Positive for: Osteoarthritis Past Medical History Notes: fibromyalgia, lumbar spinal stenosis Neurologic Complaints and Symptoms: Positive for: Numbness/parasthesias Medical History: Positive for: Neuropathy Negative for: Dementia; Quadriplegia; Paraplegia; Seizure Disorder Psychiatric Complaints and Symptoms: Positive for: Claustrophobia Medical History: Positive for: Confinement Anxiety Negative for: Anorexia/bulimia Hematologic/Lymphatic Medical History: Positive for: Lymphedema Negative for: Anemia; Hemophilia; Human Immunodeficiency Virus; Sickle Cell Disease Immunological Medical History: Negative for: Lupus Erythematosus; Raynauds; Scleroderma Oncologic Medical History: Negative for: Received Chemotherapy; Received Radiation Past Medical History Notes: hx colon CA Immunizations Pneumococcal Vaccine: Received Pneumococcal Vaccination: No Implantable Devices Yes Hospitalization / Surgery History Type of Hospitalization/Surgery tibia nail insertion right cardiac cath lumbar laminectomy pacemaker implanted appendectomy lumpectomy left breast DandC uterus partial nephrectomy tosillectomy tubal ligation Pelfrey, Devri ([id number]) [id number] Page 25 of 9 Family and Social History Cancer: No; Diabetes: Yes - Siblings; Heart Disease: Yes - Mother; Hereditary Spherocytosis: No; Hypertension: Yes - Mother; Kidney Disease: Yes - Siblings; Lung Disease: No; Seizures: No; Stroke: No; Thyroid Problems: No; Tuberculosis: No; Never smoker; Marital Status - Widowed; Alcohol Use: Rarely; Drug Use: No History; Caffeine Use:  Daily; Financial Concerns: No; Food, Clothing or Shelter Needs: No; Support System Lacking: No; Transportation Concerns: No Psychologist, prison and probation services) Signed: 04/15/2023 12:37:08 PM By: Duanne Guess MD FACS Signed: 04/15/2023 5:40:23 PM By: Zenaida Deed RN, BSN Entered By: Zenaida Deed on 04/15/2023 09:31:36 -------------------------------------------------------------------------------- SuperBill Details Patient Name: Date of Service: NATHALLY, BENAVENTE 04/15/2023 Medical Record Number: [id number] Patient Account Number: [id number] Date of Birth/Sex: Treating RN: [birth year] (82 y.o. F) Primary Care Provider: Aliene Beams Other Clinician: Referring Provider: Treating Provider/Extender: Karna Dupes in Treatment: 0 Diagnosis Coding ICD-10 Codes Code Description [phone] Non-pressure chronic ulcer of other part of right lower leg limited to breakdown of skin L97.821 Non-pressure chronic ulcer of other part of left lower leg limited to breakdown of skin I89.0 Lymphedema, not elsewhere classified  I87.2 Venous insufficiency (chronic) (peripheral) E11.622 Type 2 diabetes mellitus with other skin ulcer I50.9 Heart failure, unspecified N18.4 Chronic kidney disease, stage 4 (severe) Facility Procedures : CPT4 Code: [id number] Description: 99214 - WOUND CARE VISIT-LEV 4 EST PT Modifier: Quantity: 1 Physician Procedures : CPT4 Code Description Modifier [id number] 99213 - WC PHYS LEVEL 3 - EST PT ICD-10 Diagnosis Description L97.811 Non-pressure chronic ulcer of other part of right lower leg limited to breakdown of skin L97.821 Non-pressure chronic ulcer of other part of  left lower leg limited to breakdown of skin I89.0 Lymphedema, not elsewhere classified E11.622 Type 2 diabetes mellitus with other skin ulcer Quantity: 1 Electronic Signature(s) Signed: 04/15/2023 3:47:04 PM By: Duanne Guess MD FACS Signed: 04/15/2023 5:40:23 PM By: Zenaida Deed RN, BSN Previous  Signature: 04/15/2023 10:24:59 AM Version By: Duanne Guess MD FACS Entered By: Zenaida Deed on 04/15/2023 14:42:36

## 2023-04-16 DIAGNOSIS — I890 Lymphedema, not elsewhere classified: Secondary | ICD-10-CM | POA: Diagnosis not present

## 2023-04-19 ENCOUNTER — Other Ambulatory Visit (HOSPITAL_COMMUNITY): Payer: Self-pay | Admitting: Family Medicine

## 2023-04-23 ENCOUNTER — Encounter (HOSPITAL_BASED_OUTPATIENT_CLINIC_OR_DEPARTMENT_OTHER): Payer: Medicare Other | Admitting: Internal Medicine

## 2023-04-23 ENCOUNTER — Ambulatory Visit (HOSPITAL_BASED_OUTPATIENT_CLINIC_OR_DEPARTMENT_OTHER): Payer: Medicare Other | Admitting: Internal Medicine

## 2023-04-23 DIAGNOSIS — I890 Lymphedema, not elsewhere classified: Secondary | ICD-10-CM | POA: Diagnosis not present

## 2023-04-23 DIAGNOSIS — Z950 Presence of cardiac pacemaker: Secondary | ICD-10-CM | POA: Diagnosis not present

## 2023-04-23 DIAGNOSIS — Z833 Family history of diabetes mellitus: Secondary | ICD-10-CM | POA: Diagnosis not present

## 2023-04-23 DIAGNOSIS — E1122 Type 2 diabetes mellitus with diabetic chronic kidney disease: Secondary | ICD-10-CM | POA: Diagnosis not present

## 2023-04-23 DIAGNOSIS — E11622 Type 2 diabetes mellitus with other skin ulcer: Secondary | ICD-10-CM | POA: Diagnosis not present

## 2023-04-23 DIAGNOSIS — Z8249 Family history of ischemic heart disease and other diseases of the circulatory system: Secondary | ICD-10-CM | POA: Diagnosis not present

## 2023-04-23 DIAGNOSIS — N184 Chronic kidney disease, stage 4 (severe): Secondary | ICD-10-CM | POA: Diagnosis not present

## 2023-04-23 DIAGNOSIS — E1142 Type 2 diabetes mellitus with diabetic polyneuropathy: Secondary | ICD-10-CM | POA: Diagnosis not present

## 2023-04-23 DIAGNOSIS — Z905 Acquired absence of kidney: Secondary | ICD-10-CM | POA: Diagnosis not present

## 2023-04-23 DIAGNOSIS — I5032 Chronic diastolic (congestive) heart failure: Secondary | ICD-10-CM | POA: Diagnosis not present

## 2023-04-23 DIAGNOSIS — I872 Venous insufficiency (chronic) (peripheral): Secondary | ICD-10-CM | POA: Diagnosis not present

## 2023-04-23 DIAGNOSIS — I130 Hypertensive heart and chronic kidney disease with heart failure and stage 1 through stage 4 chronic kidney disease, or unspecified chronic kidney disease: Secondary | ICD-10-CM | POA: Diagnosis not present

## 2023-04-23 DIAGNOSIS — L97811 Non-pressure chronic ulcer of other part of right lower leg limited to breakdown of skin: Secondary | ICD-10-CM | POA: Diagnosis not present

## 2023-04-23 DIAGNOSIS — L97821 Non-pressure chronic ulcer of other part of left lower leg limited to breakdown of skin: Secondary | ICD-10-CM | POA: Diagnosis not present

## 2023-04-23 NOTE — Progress Notes (Signed)
SYBILE, FRAZIER ([id number]) [id number] Page 1 of 1 Visit Report for 04/23/2023 SuperBill Details Patient Name: Date of Service: [name], [name] 04/23/2023 Medical Record Number: [id number] Patient Account Number: [id number] Date of Birth/Sex: Treating RN: [birth year] (82 y.o. [name] Primary Care Provider: Aliene Beams Other Clinician: Referring Provider: Treating Provider/Extender: Marguerite Olea in Treatment: 1 Diagnosis Coding ICD-10 Codes Code Description [phone] Non-pressure chronic ulcer of other part of right lower leg limited to breakdown of skin L97.821 Non-pressure chronic ulcer of other part of left lower leg limited to breakdown of skin I89.0 Lymphedema, not elsewhere classified I87.2 Venous insufficiency (chronic) (peripheral) E11.622 Type 2 diabetes mellitus with other skin ulcer I50.9 Heart failure, unspecified N18.4 Chronic kidney disease, stage 4 (severe) Facility Procedures CPT4 Code Description Modifier Quantity [id number] - WOUND CARE VISIT-LEV 3 EST PT 1 Electronic Signature(s) Signed: 04/23/2023 4:35:59 PM By: Zenaida Deed RN, BSN Signed: 04/23/2023 4:37:08 PM By: Geralyn Corwin DO Entered By: Zenaida Deed on 04/23/2023 10:43:59

## 2023-04-23 NOTE — Progress Notes (Signed)
[name], [name] ([id number]) [id number] Page 1 of 6 Visit Report for 04/23/2023 Arrival Information Details Patient Name: Date of Service: [name], [name] 04/23/2023 10:15 A M Medical Record Number: [id number] Patient Account Number: [id number] Date of Birth/Sex: Treating RN: 1941-06-12 (82 y.o. [name] Primary Care : Aliene Beams Other Clinician: Referring : Treating /Extender: Marguerite Olea in Treatment: 1 Visit Information History Since Last Visit Has Dressing in Place as Prescribed: Yes Patient Arrived: Cane Has Compression in Place as Prescribed: Yes Arrival Time: 10:10 Pain Present Now: Yes Accompanied By: daughter Transfer Assistance: None Patient Identification Verified: Yes Secondary Verification Process Completed: Yes Patient Requires Transmission-Based Precautions: No Patient Has Alerts: No Electronic Signature(s) Signed: 04/23/2023 4:35:59 PM By: Zenaida Deed RN, BSN Entered By: Zenaida Deed on 04/23/2023 10:40:57 -------------------------------------------------------------------------------- Clinic Level of Care Assessment Details Patient Name: Date of Service: [name], [name] 04/23/2023 10:15 A M Medical Record Number: [id number] Patient Account Number: [id number] Date of Birth/Sex: Treating RN: [birth year] (82 y.o. [name] Primary Care : Aliene Beams Other Clinician: Referring : Treating /Extender: Marguerite Olea in Treatment: 1 Clinic Level of Care Assessment Items TOOL 4 Quantity Score []  - 0 Use when only an EandM is performed on FOLLOW-UP visit ASSESSMENTS - Nursing Assessment / Reassessment []  - 0 Reassessment of Co-morbidities (includes updates in patient status) []  - 0 Reassessment of Adherence to Treatment Plan ASSESSMENTS - Wound and Skin A ssessment / Reassessment X - Simple Wound Assessment /  Reassessment - one wound 1 5 X- 1 5 Complex Wound Assessment / Reassessment - multiple wounds X- 1 10 Dermatologic / Skin Assessment (not related to wound area) ASSESSMENTS - Focused Assessment []  - 0 Circumferential Edema Measurements - multi extremities []  - 0 Nutritional Assessment / Counseling / Intervention []  - 0 Lower Extremity Assessment (monofilament, tuning fork, pulses) []  - 0 Peripheral Arterial Disease Assessment (using hand held doppler) ASSESSMENTS - Ostomy and/or Continence Assessment and Care CECILY, STPETER ([id number]) [id number] Page 2 of 6 []  - 0 Incontinence Assessment and Management []  - 0 Ostomy Care Assessment and Management (repouching, etc.) PROCESS - Coordination of Care X - Simple Patient / Family Education for ongoing care 1 15 []  - 0 Complex (extensive) Patient / Family Education for ongoing care X- 1 10 Staff obtains Consents, Records, T Results / Process Orders est []  - 0 Staff telephones HHA, Nursing Homes / Clarify orders / etc []  - 0 Routine Transfer to another Facility (non-emergent condition) []  - 0 Routine Hospital Admission (non-emergent condition) []  - 0 New Admissions / Manufacturing engineer / Ordering NPWT Apligraf, etc. , []  - 0 Emergency Hospital Admission (emergent condition) X- 1 10 Simple Discharge Coordination []  - 0 Complex (extensive) Discharge Coordination PROCESS - Special Needs []  - 0 Pediatric / Minor Patient Management []  - 0 Isolation Patient Management []  - 0 Hearing / Language / Visual special needs []  - 0 Assessment of Community assistance (transportation, D/C planning, etc.) []  - 0 Additional assistance / Altered mentation []  - 0 Support Surface(s) Assessment (bed, cushion, seat, etc.) INTERVENTIONS - Wound Cleansing / Measurement []  - 0 Simple Wound Cleansing - one wound X- 2 5 Complex Wound Cleansing - multiple wounds []  - 0 Wound Imaging (photographs - any number of  wounds) []  - 0 Wound Tracing (instead of photographs) []  - 0 Simple Wound Measurement - one wound []  - 0 Complex Wound Measurement - multiple wounds INTERVENTIONS - Wound Dressings []  - 0 Small Wound  Dressing one or multiple wounds X- 2 15 Medium Wound Dressing one or multiple wounds []  - 0 Large Wound Dressing one or multiple wounds []  - 0 Application of Medications - topical []  - 0 Application of Medications - injection INTERVENTIONS - Miscellaneous []  - 0 External ear exam []  - 0 Specimen Collection (cultures, biopsies, blood, body fluids, etc.) []  - 0 Specimen(s) / Culture(s) sent or taken to Lab for analysis []  - 0 Patient Transfer (multiple staff / Nurse, adult / Similar devices) []  - 0 Simple Staple / Suture removal (25 or less) []  - 0 Complex Staple / Suture removal (26 or more) []  - 0 Hypo / Hyperglycemic Management (close monitor of Blood Glucose) []  - 0 Ankle / Brachial Index (ABI) - do not check if billed separately X- 1 5 Vital Signs Has the patient been seen at the hospital within the last three years: Yes Total Score: 100 Level Of Care: New/Established - Level 3 Perkey, Antonietta ([id number]) [id number] Page 3 of 6 Electronic Signature(s) Signed: 04/23/2023 4:35:59 PM By: Zenaida Deed RN, BSN Entered By: Zenaida Deed on 04/23/2023 10:43:51 -------------------------------------------------------------------------------- Encounter Discharge Information Details Patient Name: Date of Service: [name], [name] 04/23/2023 10:15 A M Medical Record Number: [id number] Patient Account Number: [id number] Date of Birth/Sex: Treating RN: 09/16/1940 (82 y.o. [name] Primary Care : Aliene Beams Other Clinician: Referring : Treating /Extender: Marguerite Olea in Treatment: 1 Encounter Discharge Information Items Discharge Condition: Stable Ambulatory Status: Cane Discharge  Destination: Home Transportation: Private Auto Accompanied By: daughter Schedule Follow-up Appointment: Yes Clinical Summary of Care: Patient Declined Electronic Signature(s) Signed: 04/23/2023 4:35:59 PM By: Zenaida Deed RN, BSN Entered By: Zenaida Deed on 04/23/2023 10:42:13 -------------------------------------------------------------------------------- Pain Assessment Details Patient Name: Date of Service: [name], [name] 04/23/2023 10:15 A M Medical Record Number: [id number] Patient Account Number: [id number] Date of Birth/Sex: Treating RN: [birth year] (82 y.o. [name] Primary Care : Aliene Beams Other Clinician: Referring : Treating /Extender: Marguerite Olea in Treatment: 1 Active Problems Location of Pain Severity and Description of Pain Patient Has Paino Yes Site Locations Pain Location: Generalized Pain With Dressing Change: No Duration of the Pain. Constant / Intermittento Intermittent Rate the pain. Current Pain Level: 5 Jasek, Yarnell [phone] [id number] Page 4 of 6 Character of Pain Describe the Pain: Aching Pain Management and Medication Current Pain Management: Medication: Yes Notes reports chronic back pain Electronic Signature(s) Signed: 04/23/2023 4:35:59 PM By: Zenaida Deed RN, BSN Entered By: Zenaida Deed on 04/23/2023 10:43:00 -------------------------------------------------------------------------------- Patient/Caregiver Education Details Patient Name: Date of Service: [name] 8/13/2024andnbsp10:15 A M Medical Record Number: [id number] Patient Account Number: [id number] Date of Birth/Gender: Treating RN: [birth year] (82 y.o. [name] Primary Care Physician: Aliene Beams Other Clinician: Referring Physician: Treating Physician/Extender: Marguerite Olea in Treatment: 1 Education Assessment Education Provided  To: Patient Education Topics Provided Venous: Methods: Explain/Verbal Responses: Reinforcements needed, State content correctly Wound/Skin Impairment: Methods: Explain/Verbal Responses: Reinforcements needed, State content correctly Electronic Signature(s) Signed: 04/23/2023 4:35:59 PM By: Zenaida Deed RN, BSN Entered By: Zenaida Deed on 04/23/2023 10:41:54 -------------------------------------------------------------------------------- Wound Assessment Details Patient Name: Date of Service: [name], [name] 04/23/2023 10:15 A M Medical Record Number: [id number] Patient Account Number: [id number] Date of Birth/Sex: Treating RN: July 13, 1941 (82 y.o. [name] Primary Care : Aliene Beams Other Clinician: Referring : Treating /Extender: Marguerite Olea in Treatment: 1 Wound Status Wound Number: 10 Primary Etiology: Lymphedema Wound Location: Left, Medial  Lower Leg Secondary Etiology: Diabetic Wound/Ulcer of the Lower Extremity [place], Bosie Clos ([id number]) [id number] Page 5 of 6 Wounding Event: Gradually Appeared Wound Status: Open Date Acquired: 03/25/2023 Weeks Of Treatment: 1 Clustered Wound: No Wound Measurements Length: (cm) 0.3 Width: (cm) 0.3 Depth: (cm) 0.1 Area: (cm) 0.071 Volume: (cm) 0.007 % Reduction in Area: 0% % Reduction in Volume: 0% Wound Description Classification: Full Thickness Without Exposed Suppor Exudate Amount: Medium Exudate Type: Serous Exudate Color: amber t Structures Periwound Skin Texture Texture Color No Abnormalities Noted: No No Abnormalities Noted: No Moisture No Abnormalities Noted: No Treatment Notes Wound #10 (Lower Leg) Wound Laterality: Left, Medial Cleanser Peri-Wound Care Sween Lotion (Moisturizing lotion) Discharge Instruction: Apply moisturizing lotion as directed Topical Primary Dressing Maxorb Extra Ag+ Alginate Dressing, 4x4.75  (in/in) Discharge Instruction: Apply to wound bed as instructed Secondary Dressing ABD Pad, 8x10 Discharge Instruction: Apply over primary dressing as directed. Secured With Compression Wrap Kerlix Roll 4.5x3.1 (in/yd) Discharge Instruction: Apply Kerlix and Coban compression as directed. Coban Self-Adherent Wrap 4x5 (in/yd) Discharge Instruction: Apply over Kerlix as directed. Compression Stockings Add-Ons Electronic Signature(s) Signed: 04/23/2023 4:35:59 PM By: Zenaida Deed RN, BSN Entered By: Zenaida Deed on 04/23/2023 10:41:18 -------------------------------------------------------------------------------- Wound Assessment Details Patient Name: Date of Service: [name], [name] 04/23/2023 10:15 A M Medical Record Number: [id number] Patient Account Number: [id number] Date of Birth/Sex: Treating RN: April 10, 1941 (82 y.o. [name] Primary Care : Aliene Beams Other Clinician: Referring : Treating /Extender: Manya Silvas [place], Bosie Clos ([id number]) [id number] Page 6 of 6 Weeks in Treatment: 1 Wound Status Wound Number: 11 Primary Etiology: Lymphedema Wound Location: Right, Lateral Lower Leg Secondary Etiology: Diabetic Wound/Ulcer of the Lower Extremity Wounding Event: Gradually Appeared Wound Status: Open Date Acquired: 03/25/2023 Weeks Of Treatment: 1 Clustered Wound: No Wound Measurements Length: (cm) 1.8 Width: (cm) 2.3 Depth: (cm) 0.1 Area: (cm) 3.252 Volume: (cm) 0.325 % Reduction in Area: 0% % Reduction in Volume: 0% Wound Description Classification: Full Thickness Without Exposed Suppor Exudate Amount: Medium Exudate Type: Serous Exudate Color: amber t Structures Periwound Skin Texture Texture Color No Abnormalities Noted: No No Abnormalities Noted: No Moisture No Abnormalities Noted: No Treatment Notes Wound #11 (Lower Leg) Wound Laterality: Right,  Lateral Cleanser Peri-Wound Care Sween Lotion (Moisturizing lotion) Discharge Instruction: Apply moisturizing lotion as directed Topical Primary Dressing Maxorb Extra Ag+ Alginate Dressing, 4x4.75 (in/in) Discharge Instruction: Apply to wound bed as instructed Secondary Dressing ABD Pad, 8x10 Discharge Instruction: Apply over primary dressing as directed. Secured With Compression Wrap Kerlix Roll 4.5x3.1 (in/yd) Discharge Instruction: Apply Kerlix and Coban compression as directed. Coban Self-Adherent Wrap 4x5 (in/yd) Discharge Instruction: Apply over Kerlix as directed. Compression Stockings Add-Ons Electronic Signature(s) Signed: 04/23/2023 4:35:59 PM By: Zenaida Deed RN, BSN Entered By: Zenaida Deed on 04/23/2023 10:41:19

## 2023-04-29 ENCOUNTER — Ambulatory Visit (INDEPENDENT_AMBULATORY_CARE_PROVIDER_SITE_OTHER): Payer: Medicare Other

## 2023-04-29 DIAGNOSIS — I495 Sick sinus syndrome: Secondary | ICD-10-CM | POA: Diagnosis not present

## 2023-04-30 ENCOUNTER — Ambulatory Visit (HOSPITAL_BASED_OUTPATIENT_CLINIC_OR_DEPARTMENT_OTHER): Payer: Medicare Other | Admitting: General Surgery

## 2023-04-30 LAB — CUP PACEART REMOTE DEVICE CHECK
Battery Remaining Longevity: 33 mo
Battery Voltage: 2.95 V
Brady Statistic AP VP Percent: 0.11 %
Brady Statistic AP VS Percent: 99.27 %
Brady Statistic AS VP Percent: 0 %
Brady Statistic AS VS Percent: 0.62 %
Brady Statistic RA Percent Paced: 99.12 %
Brady Statistic RV Percent Paced: 0.13 %
Date Time Interrogation Session: 20240820144211
Implantable Lead Connection Status: 753985
Implantable Lead Connection Status: 753985
Implantable Lead Implant Date: 20161110
Implantable Lead Implant Date: 20161110
Implantable Lead Location: 753859
Implantable Lead Location: 753860
Implantable Lead Model: 5076
Implantable Lead Model: 5076
Implantable Pulse Generator Implant Date: 20161110
Lead Channel Impedance Value: 342 Ohm
Lead Channel Impedance Value: 342 Ohm
Lead Channel Impedance Value: 380 Ohm
Lead Channel Impedance Value: 399 Ohm
Lead Channel Pacing Threshold Amplitude: 0.625 V
Lead Channel Pacing Threshold Amplitude: 1.75 V
Lead Channel Pacing Threshold Pulse Width: 0.4 ms
Lead Channel Pacing Threshold Pulse Width: 0.4 ms
Lead Channel Sensing Intrinsic Amplitude: 2.875 mV
Lead Channel Sensing Intrinsic Amplitude: 2.875 mV
Lead Channel Sensing Intrinsic Amplitude: 4.25 mV
Lead Channel Sensing Intrinsic Amplitude: 4.25 mV
Lead Channel Setting Pacing Amplitude: 1.5 V
Lead Channel Setting Pacing Amplitude: 4 V
Lead Channel Setting Pacing Pulse Width: 0.4 ms
Lead Channel Setting Sensing Sensitivity: 2.8 mV
Zone Setting Status: 755011
Zone Setting Status: 755011

## 2023-05-02 ENCOUNTER — Encounter (HOSPITAL_BASED_OUTPATIENT_CLINIC_OR_DEPARTMENT_OTHER): Payer: Medicare Other | Admitting: General Surgery

## 2023-05-02 DIAGNOSIS — E1122 Type 2 diabetes mellitus with diabetic chronic kidney disease: Secondary | ICD-10-CM | POA: Diagnosis not present

## 2023-05-02 DIAGNOSIS — I872 Venous insufficiency (chronic) (peripheral): Secondary | ICD-10-CM | POA: Diagnosis not present

## 2023-05-02 DIAGNOSIS — I890 Lymphedema, not elsewhere classified: Secondary | ICD-10-CM | POA: Diagnosis not present

## 2023-05-02 DIAGNOSIS — Z833 Family history of diabetes mellitus: Secondary | ICD-10-CM | POA: Diagnosis not present

## 2023-05-02 DIAGNOSIS — Z950 Presence of cardiac pacemaker: Secondary | ICD-10-CM | POA: Diagnosis not present

## 2023-05-02 DIAGNOSIS — N184 Chronic kidney disease, stage 4 (severe): Secondary | ICD-10-CM | POA: Diagnosis not present

## 2023-05-02 DIAGNOSIS — E1142 Type 2 diabetes mellitus with diabetic polyneuropathy: Secondary | ICD-10-CM | POA: Diagnosis not present

## 2023-05-02 DIAGNOSIS — Z8249 Family history of ischemic heart disease and other diseases of the circulatory system: Secondary | ICD-10-CM | POA: Diagnosis not present

## 2023-05-02 DIAGNOSIS — Z905 Acquired absence of kidney: Secondary | ICD-10-CM | POA: Diagnosis not present

## 2023-05-02 DIAGNOSIS — L97811 Non-pressure chronic ulcer of other part of right lower leg limited to breakdown of skin: Secondary | ICD-10-CM | POA: Diagnosis not present

## 2023-05-02 DIAGNOSIS — E11622 Type 2 diabetes mellitus with other skin ulcer: Secondary | ICD-10-CM | POA: Diagnosis not present

## 2023-05-02 DIAGNOSIS — I130 Hypertensive heart and chronic kidney disease with heart failure and stage 1 through stage 4 chronic kidney disease, or unspecified chronic kidney disease: Secondary | ICD-10-CM | POA: Diagnosis not present

## 2023-05-02 DIAGNOSIS — I5032 Chronic diastolic (congestive) heart failure: Secondary | ICD-10-CM | POA: Diagnosis not present

## 2023-05-02 DIAGNOSIS — L97821 Non-pressure chronic ulcer of other part of left lower leg limited to breakdown of skin: Secondary | ICD-10-CM | POA: Diagnosis not present

## 2023-05-06 ENCOUNTER — Telehealth (HOSPITAL_COMMUNITY): Payer: Self-pay

## 2023-05-06 NOTE — Progress Notes (Incomplete)
Advanced [facility] Failure Clinic Note   Date:  05/07/2023   ID:  [name], DOB 1941-06-16, MRN [id number]  Location: Home  Provider location: [facility] Type of Visit: Established patient  PCP:  Aliene Beams, MD  Cardiologist:  Armanda Magic, MD Nephrology: Dr. Malen Gauze HF Cardiologist: Dr. Gala Romney  Chief Complaint: Heart Failure follow-up   HPI: Sylvette Morace is a 82 y.o.Marland Kitchen female with history of diastolic heart failure, DM, GERD, colon cancer, CKD stage III, 2016 pacemaker (MDT) due to symptomatic sinus node dysfunction.  In 7/19, she had 12 pound weight and she was sent to the ED by her PCP. She was sent home on metolazone every other day for 14 days. She took 4 doses and her creatinine went up to 3 so metolazone was stopped.   R/L cath in 9/19. Normal cors. Elevated filling pressures with pulmonary venous HTN.   Had fall with R tibia fracture in 9/21    Having more dyspnea at follow up 2/22, arranged for RHC and repeat echo.  RHC (2/22) showed mild to moderate elevated filling pressures with prominent v waves in PCWP tracing, mild pulmonary HTN and high CO with no evidence of intracardiac shunting. Echo (4/22) showed EF 60-65%, RV ok, mild AS.  SCr remained elevated on lab check at EP visit 8/22 and irbesartan stopped.  Echo 01/17/22 EF 60-65% G1DD RV normal. RVSP 31  Follow up 9/23, chronically  NYHA IIIb, volume OK on torsemide 40 + metolazone once a week.  Today she returns for HF follow up with her daughter. Overall feeling fair. Feels more SOB walking short distances now, worse over the past year. Has chronic LEE, [facility]. She says wounds are improving. She has positional dizziness. She had a mechanical fall a month ago. She has dull chest ache, occurs when she is eating, she arributes this to indigestion. Denies palpitations, or PND/Orthopnea. She chronically sleeps in a recliner. Appetite ok. No fever or  chills. Weight at home 207 pounds. She stopped her metolazone awhile back 2/2 to increased urinary frequency. Off SGLT2i due to vaginitis. Unable to tolerate CPAP.  Cardiac Studies: - Echo (5/23): EF 60-65%, grade I DD, RV normal, RVSP 31  - Echo (4/22): EF 60-65%, RV ok, mild AS mean gradient 21.0 mmHg  - RHC (2/22):  RA = 9 RV = 49/13 PA = 49/17 (33) PCW = 22 ( v waves 28) Fick cardiac output/index = 8.0/3.9 PVR = 1.6 WU FA sat = 95% PA sat = 74%, 73% High SVC sat = 69%  Assessment: 1. Mild to moderately elevated filling pressures with prominent v waves in PCWP tracing suggestive of diastolic dysfunction versus mitral regurgitation 2. Mild pulmonary venous HTN 3. High cardiac output with no evidence of intracardiac shunting  - PFTs 05/23/18: FEV1: 1.72 FEV1/FVC ratio: 77% DLCO: 70%  - R/LHC 05/19/18: Ao = 192/74 (118) LV = 201/26 RA = 14 RV = 53/18 PA = 53/19 (38) PCW = 27 (v = 41) Fick cardiac output/index = 5.6/2.8 PVR = 2.0 WU Ao sat = 95% PA sat = 64%, 66%  1. Normal coronaries with ectactic vessels suggestive of longstanding HTN 2. Severe HTN 3. Normal LV function 4. Signficantly elevated filling pressures with pulmonary venous HTN in setting of holding diuretics for 2 days  Plan/Discussion: Filling pressures and BP elevated. Will resume diuretics. Will need aggressive titration of ant-HTN regimen.  - Echo 05/14/18: EF 50-55%, mild  LVH - Aortic valve: Sclerosis without stenosis. - Mitral valve: Moderately calcified annulus. Moderately thickened,   mildly calcified leaflets . - Left atrium: The atrium was mildly dilated. - Atrial septum: No defect or patent foramen ovale was identified.   -Sleep study (10/19): AHI 49  - Myoview (06/21/15): Nuclear stress EF: 64%. There was no ST segment deviation noted during stress. The study is normal. This is a low risk study. No ischemia identified.  SH: Former 30-40 pack year smoker, quit 25 years ago.   Past  Medical History:  Diagnosis Date   Anxiety    Arthritis    Cancer (HCC)    colon   CHF (congestive heart failure) (HCC)    Chronic kidney disease    stage 3   Chronic pain syndrome    Diabetes (HCC)    Diabetic neuropathy (HCC)    Dyslipidemia    Early cataracts, bilateral    Fibromyalgia    GERD (gastroesophageal reflux disease)    H/O syncope    Heart murmur    Hypertension    Lumbar spinal stenosis    and scoliosis   Pneumonia    PONV (postoperative nausea and vomiting)    Presence of permanent cardiac pacemaker    Restless legs    Spinal headache    Thyroid nodule    Past Surgical History:  Procedure Laterality Date   APPENDECTOMY     BREAST SURGERY     lumpectomy   COLON SURGERY     DILATION AND CURETTAGE OF UTERUS     EP IMPLANTABLE DEVICE N/A 07/21/2015   Procedure: Pacemaker Implant;  Surgeon: Duke Salvia, MD;  Location: [facility] INVASIVE CV LAB;  Service: Cardiovascular;  Laterality: N/A;   LUMBAR LAMINECTOMY/DECOMPRESSION MICRODISCECTOMY N/A 02/02/2016   Procedure: DECOMPRESSION L4-L5 WITH INSITE 2 FUSION ;  Surgeon: Venita Lick, MD;  Location: [facility] OR;  Service: Orthopedics;  Laterality: N/A;   PARTIAL NEPHRECTOMY     RIGHT HEART CATH N/A 11/02/2020   Procedure: RIGHT HEART CATH;  Surgeon: Dolores Patty, MD;  Location: [facility] INVASIVE CV LAB;  Service: Cardiovascular;  Laterality: N/A;   RIGHT/LEFT HEART CATH AND CORONARY ANGIOGRAPHY N/A 05/19/2018   Procedure: RIGHT/LEFT HEART CATH AND CORONARY ANGIOGRAPHY;  Surgeon: Dolores Patty, MD;  Location: [facility] INVASIVE CV LAB;  Service: Cardiovascular;  Laterality: N/A;   TIBIA IM NAIL INSERTION Right 05/19/2020   Procedure: INTRAMEDULLARY (IM) NAIL TIBIAL;  Surgeon: Yolonda Kida, MD;  Location: [facility] OR;  Service: Orthopedics;  Laterality: Right;   TONSILLECTOMY     TUBAL LIGATION     Current Outpatient Medications  Medication Sig Dispense Refill   acetaminophen (TYLENOL) 650 MG CR tablet 2 tablets     B-D  UF III MINI PEN NEEDLES 31G X 5 MM MISC      Blood Glucose Monitoring Suppl (ACCU-CHEK AVIVA PLUS) w/Device KIT      Calcium Carb-Cholecalciferol (CALCIUM 500 + D3) 500-15 MG-MCG TABS Take 2 tablets by mouth daily.     Continuous Blood Gluc Sensor (FREESTYLE LIBRE 2 SENSOR) MISC See admin instructions.     cyclobenzaprine (FLEXERIL) 10 MG tablet Take 10 mg by mouth 3 (three) times daily as needed for muscle spasms.     diclofenac Sodium (VOLTAREN) 1 % GEL Apply 1 application topically at bedtime as needed (sleep).     diphenhydrAMINE (BENADRYL) 25 MG tablet Take 25 mg by mouth daily as needed for itching or allergies.     DULoxetine (CYMBALTA) 60  MG capsule Take 60 mg by mouth daily.     gabapentin (NEURONTIN) 300 MG capsule Take 300 mg by mouth at bedtime.     hydrALAZINE (APRESOLINE) 100 MG tablet Take 1 tablet (100 mg total) by mouth 3 (three) times daily. 90 tablet 6   insulin degludec (TRESIBA FLEXTOUCH) 100 UNIT/ML FlexTouch Pen 50 Units as directed.     labetalol (NORMODYNE) 200 MG tablet Take 1 tablet (200 mg total) by mouth 2 (two) times daily. Absolute last refill without office visit please call [phone] to schedule follow up 180 tablet 2   linaCLOtide (LINZESS PO) Take 72 mcg by mouth daily.     metolazone (ZAROXOLYN) 2.5 MG tablet Take 1 tablet (2.5 mg total) by mouth once a week. Every Friday 12 tablet 3   oxyCODONE-acetaminophen (PERCOCET) 10-325 MG tablet Take 1 tablet by mouth every 4 (four) hours as needed for pain.     potassium chloride SA (KLOR-CON M) 20 MEQ tablet Take 1 tablet (20 mEq total) by mouth daily. Take 2 extra tab on Friday with metolazone 40 tablet 11   pramipexole (MIRAPEX) 0.125 MG tablet Take 0.25 mg by mouth 2 (two) times daily.     rosuvastatin (CRESTOR) 10 MG tablet Take 10 mg by mouth daily.      torsemide (DEMADEX) 20 MG tablet Take 2 tablets (40 mg total) by mouth daily. 60 tablet 11   Cholecalciferol (VITAMIN D) 2000 UNITS tablet Take 2,000 Units by  mouth daily.     No current facility-administered medications for this encounter.   Allergies:   Penicillins, Codeine, Adhesive [tape], Aspirin, Crab (diagnostic), Iodine, Ivp dye [iodinated contrast media], and Sulfa antibiotics   Social History:  The patient  reports that she quit smoking about 39 years ago. Her smoking use included cigarettes. She started smoking about 69 years ago. She has never used smokeless tobacco. She reports current alcohol use. She reports that she does not use drugs.   Family History:  The patient's family history includes CAD in an other family member; Sick sinus syndrome in her brother.   ROS:  Please see the history of present illness.   All other systems are personally reviewed and negative.   Recent Labs: 05/23/2022: B Natriuretic Peptide 67.7 03/27/2023: ALT 17; BUN 40; Creatinine, Ser 1.52; Hemoglobin 12.8; Platelets 148; Potassium 4.0; Sodium 141  Personally reviewed   Wt Readings from Last 3 Encounters:  05/07/23 98 kg (216 lb)  10/05/22 94.4 kg (208 lb 3.2 oz)  05/23/22 96 kg (211 lb 9.6 oz)    BP 130/68   Pulse 71   Wt 98 kg (216 lb)   SpO2 94%   BMI 38.26 kg/m   Physical Exam:   General:  NAD. No resp difficulty, walked into clinic HEENT: Normal Neck: Supple. No JVD. Carotids 2+ bilat; no bruits. No lymphadenopathy or thryomegaly appreciated. Cor: PMI nondisplaced. Regular rate & rhythm. No rubs, gallops, 2/6 AS Lungs: Clear, diminished in bases Abdomen: Soft, obese, nontender, nondistended. No hepatosplenomegaly. No bruits or masses. Good bowel sounds. Extremities: No cyanosis, clubbing, rash, 2+ BLE pre-tibial edema, ace wraps on legs Neuro: Alert & oriented x 3, cranial nerves grossly intact. Moves all 4 extremities w/o difficulty. Affect pleasant.  ReDs: 33%  PPM interrogation (personally reviewed): 99.5% A-paced, 0.9 hr/day activity, < 0.1 hr/day in AF/AT  ECG (personally reviewed): NSR a-paced, 72 bpm  Assessment & Plan: 1.  Chronic Diastolic Heart Failure  - Echo 05/14/18: EF 50-55%, posterior basal and inferolateral  HK, mild LVH, LA mildly dilated - R/LHC 05/19/18: normal coronaries, elevated filling pressure (had held diuretics x2 days), severe HTN. - RHC (2/22): Mild to moderate elevated filling pressures, mild pulm venous HTN, high CO with no shunting. - Echo (4/22): EF 60-65%, RV ok, mild AS mean gradient 21.0 mmHg - Echo (5/23): EF 60-65% G1DD RV normal. RVSP 31 - Chronically NYHA IIIb, functional status confounded by body habitus and general deconditioning. - Volume up today, but also has significant LE edema. (Which I suspect she also has severe venous insufficiency). ReDs 33% so R>L HF. - Add metolazone 2.5 mg + extra 40 KCL every Tuesdday. - Continue torsemide 40 mg daily. - Off Jardiance due to vaginal irriation. - No ARB/ARNi/spiro with CKD - Recent labs reviewed (03/27/23) and are stable, K 4, SCr 1.52 - Repeat BMET in 10 days. - Continue ace wraps - Update echo  2. HTN, severe - BP controlled. - Continue hydralazine 100 mg tid. - Continue labetalol 200 mg bid.  3. Aortic Stenosis - Mild by echo 4/22, mean gradient 21 - Mild by echo 01/17/22, mean gradient 13  - Repeat echo  4. OSA  - Severe, AHI 49 by PSG in 10/19 - f/u testing AHI 11 - Had televisit with Dr. Mayford Knife on 09/30/19. She refused CPAP due to claustrophobia.  - Repeat HST showed severe, AHI 31.1.  - Continues to refuse CPAP  5. Dyspnea - LHC 05/19/18 with normal coronaries. - PFTs with DLCO 70%. - RHC (2/22): Mild pulmonary venous hypertension - Suspect she is limited body habitus and general deconditioning but also has significant LE edema.   6. CKD Stage IV - Followed by [facility] - Baseline SCr 2.0-2.4, most recent SCr 1.52 - No longer on SGLT2i   7. Sinus node dysfunction s/p MDT PPM - Follows with Dr Graciela Husbands.   8. DM - She is on insulin - Per PCP  9. Obesity - Needs weight loss.  - Body mass index is 38.26 kg/m.  -  Failed Ozempic due to constipation.  - Discussed other options such as tirzepatide, she declines today.  Follow up in 4 months with Dr. Gala Romney, update echo.   Anderson Malta [place], FNP  05/07/2023 9:53 AM  [facility] [address] [facility] [place] [address] [phone] (office) [phone] (fax)

## 2023-05-06 NOTE — Telephone Encounter (Signed)
Called to confirm/remind patient of their appointment at the [facility] on 05/07/23.   Patient reminded to bring all medications and/or complete list.  Confirmed patient has transportation. Gave directions, instructed to utilize valet parking.  Confirmed appointment prior to ending call.

## 2023-05-07 ENCOUNTER — Encounter (HOSPITAL_COMMUNITY): Payer: Self-pay

## 2023-05-07 ENCOUNTER — Encounter (HOSPITAL_BASED_OUTPATIENT_CLINIC_OR_DEPARTMENT_OTHER): Payer: Medicare Other | Admitting: General Surgery

## 2023-05-07 ENCOUNTER — Ambulatory Visit (HOSPITAL_COMMUNITY)
Admission: RE | Admit: 2023-05-07 | Discharge: 2023-05-07 | Disposition: A | Discharge status: Home / Self Care | Payer: Medicare Other | Source: Ambulatory Visit | Attending: Family Medicine | Admitting: Family Medicine

## 2023-05-07 VITALS — BP 130/68 | HR 71 | Wt 216.0 lb

## 2023-05-07 DIAGNOSIS — E1142 Type 2 diabetes mellitus with diabetic polyneuropathy: Secondary | ICD-10-CM | POA: Diagnosis not present

## 2023-05-07 DIAGNOSIS — Z794 Long term (current) use of insulin: Secondary | ICD-10-CM | POA: Diagnosis not present

## 2023-05-07 DIAGNOSIS — E1122 Type 2 diabetes mellitus with diabetic chronic kidney disease: Secondary | ICD-10-CM | POA: Diagnosis not present

## 2023-05-07 DIAGNOSIS — I130 Hypertensive heart and chronic kidney disease with heart failure and stage 1 through stage 4 chronic kidney disease, or unspecified chronic kidney disease: Secondary | ICD-10-CM | POA: Insufficient documentation

## 2023-05-07 DIAGNOSIS — I10 Essential (primary) hypertension: Secondary | ICD-10-CM | POA: Diagnosis not present

## 2023-05-07 DIAGNOSIS — I495 Sick sinus syndrome: Secondary | ICD-10-CM

## 2023-05-07 DIAGNOSIS — N184 Chronic kidney disease, stage 4 (severe): Secondary | ICD-10-CM | POA: Diagnosis not present

## 2023-05-07 DIAGNOSIS — Z950 Presence of cardiac pacemaker: Secondary | ICD-10-CM | POA: Diagnosis not present

## 2023-05-07 DIAGNOSIS — K30 Functional dyspepsia: Secondary | ICD-10-CM | POA: Diagnosis not present

## 2023-05-07 DIAGNOSIS — R079 Chest pain, unspecified: Secondary | ICD-10-CM | POA: Insufficient documentation

## 2023-05-07 DIAGNOSIS — R600 Localized edema: Secondary | ICD-10-CM | POA: Diagnosis not present

## 2023-05-07 DIAGNOSIS — I350 Nonrheumatic aortic (valve) stenosis: Secondary | ICD-10-CM

## 2023-05-07 DIAGNOSIS — I872 Venous insufficiency (chronic) (peripheral): Secondary | ICD-10-CM | POA: Diagnosis not present

## 2023-05-07 DIAGNOSIS — R42 Dizziness and giddiness: Secondary | ICD-10-CM | POA: Diagnosis not present

## 2023-05-07 DIAGNOSIS — Z6838 Body mass index (BMI) 38.0-38.9, adult: Secondary | ICD-10-CM | POA: Diagnosis not present

## 2023-05-07 DIAGNOSIS — Z79899 Other long term (current) drug therapy: Secondary | ICD-10-CM | POA: Diagnosis not present

## 2023-05-07 DIAGNOSIS — I890 Lymphedema, not elsewhere classified: Secondary | ICD-10-CM | POA: Diagnosis not present

## 2023-05-07 DIAGNOSIS — R0600 Dyspnea, unspecified: Secondary | ICD-10-CM | POA: Diagnosis not present

## 2023-05-07 DIAGNOSIS — G4733 Obstructive sleep apnea (adult) (pediatric): Secondary | ICD-10-CM | POA: Diagnosis not present

## 2023-05-07 DIAGNOSIS — L97811 Non-pressure chronic ulcer of other part of right lower leg limited to breakdown of skin: Secondary | ICD-10-CM | POA: Diagnosis not present

## 2023-05-07 DIAGNOSIS — L97822 Non-pressure chronic ulcer of other part of left lower leg with fat layer exposed: Secondary | ICD-10-CM | POA: Diagnosis not present

## 2023-05-07 DIAGNOSIS — N1832 Chronic kidney disease, stage 3b: Secondary | ICD-10-CM | POA: Diagnosis not present

## 2023-05-07 DIAGNOSIS — Z905 Acquired absence of kidney: Secondary | ICD-10-CM | POA: Diagnosis not present

## 2023-05-07 DIAGNOSIS — I5032 Chronic diastolic (congestive) heart failure: Secondary | ICD-10-CM | POA: Diagnosis not present

## 2023-05-07 DIAGNOSIS — Z833 Family history of diabetes mellitus: Secondary | ICD-10-CM | POA: Diagnosis not present

## 2023-05-07 DIAGNOSIS — E669 Obesity, unspecified: Secondary | ICD-10-CM | POA: Insufficient documentation

## 2023-05-07 DIAGNOSIS — R0602 Shortness of breath: Secondary | ICD-10-CM | POA: Insufficient documentation

## 2023-05-07 DIAGNOSIS — E11622 Type 2 diabetes mellitus with other skin ulcer: Secondary | ICD-10-CM | POA: Diagnosis not present

## 2023-05-07 DIAGNOSIS — L97821 Non-pressure chronic ulcer of other part of left lower leg limited to breakdown of skin: Secondary | ICD-10-CM | POA: Diagnosis not present

## 2023-05-07 DIAGNOSIS — L97812 Non-pressure chronic ulcer of other part of right lower leg with fat layer exposed: Secondary | ICD-10-CM | POA: Diagnosis not present

## 2023-05-07 DIAGNOSIS — Z8249 Family history of ischemic heart disease and other diseases of the circulatory system: Secondary | ICD-10-CM | POA: Diagnosis not present

## 2023-05-07 MED ORDER — POTASSIUM CHLORIDE CRYS ER 20 MEQ PO TBCR: 20.0000 meq | EXTENDED_RELEASE_TABLET | Freq: Every day | ORAL | 11 refills | Status: DC

## 2023-05-07 MED ORDER — METOLAZONE 2.5 MG PO TABS: 2.5000 mg | ORAL_TABLET | ORAL | 3 refills | Status: DC

## 2023-05-07 NOTE — Progress Notes (Signed)
ReDS Vest / Clip - 05/07/23 1000       ReDS Vest / Clip   Station Marker B    Ruler Value 36    ReDS Value Range Low volume    ReDS Actual Value 33

## 2023-05-07 NOTE — Patient Instructions (Addendum)
Medication Changes:  START: METOLAZONE 2.5MG  EVERY WEDNESDAY WITH EXTRA 40 MEQ OF POTASSIUM   Lab Work:  PLEASE RETURN FOR LABS IN 10 DAYS AS SCHEDULED  Testing/Procedures:  Your physician has requested that you have an echocardiogram. Echocardiography is a painless test that uses sound waves to create images of your heart. It provides your doctor with information about the size and shape of your heart and how well your heart's chambers and valves are working. This procedure takes approximately one hour. There are no restrictions for this procedure. Please do NOT wear cologne, perfume, aftershave, or lotions (deodorant is allowed). Please arrive 15 minutes prior to your appointment time.  Follow-Up in: 4 MONTHS WITH DR. Gala Romney AS SCHEDULED   At the [facility], you and your health needs are our priority. We have a designated team specialized in the treatment of Heart Failure. This Care Team includes your primary Heart Failure Specialized Cardiologist (physician), Advanced Practice Providers (APPs- Physician Assistants and Nurse Practitioners), and Pharmacist who all work together to provide you with the care you need, when you need it.   You may see any of the following providers on your designated Care Team at your next follow up:  Dr. Arvilla Meres Dr. Marca Ancona Dr. Marcos Eke, NP Robbie Lis, [place] [facility] [place], [place] Brynda Peon, NP Karle Plumber, PharmD   Please be sure to bring in all your medications bottles to every appointment.   Need to Contact us:  If you have any questions or concerns before your next appointment please send [place] a message through [place] or call our office at [phone].    TO LEAVE A MESSAGE FOR THE NURSE SELECT OPTION 2, PLEASE LEAVE A MESSAGE INCLUDING: YOUR NAME DATE OF BIRTH CALL BACK NUMBER REASON FOR CALL**this is important as we prioritize the call backs  YOU WILL RECEIVE A  CALL BACK THE SAME DAY AS LONG AS YOU CALL BEFORE 4:00 PM

## 2023-05-07 NOTE — Progress Notes (Signed)
WAJEEHA, ALSBURY ([id number]) [id number] Page 1 of 4 Visit Report for 05/02/2023 Arrival Information Details Patient Name: Date of Service: [name], [name] 05/02/2023 3:30 PM Medical Record Number: [id number] Patient Account Number: [id number] Date of Birth/Sex: Treating RN: [birth year] (82 y.o. Gevena Mart Primary Care Kimball Appleby: Aliene Beams Other Clinician: Referring Verne Lanuza: Treating Fielding Mault/Extender: Karna Dupes in Treatment: 2 Visit Information History Since Last Visit All ordered tests and consults were completed: Yes Patient Arrived: Ambulatory Added or deleted any medications: No Arrival Time: 16:07 Any new allergies or adverse reactions: No Accompanied By: self Had a fall or experienced change in No Transfer Assistance: None activities of daily living that may affect Patient Identification Verified: Yes risk of falls: Secondary Verification Process Completed: Yes Signs or symptoms of abuse/neglect since last visito No Patient Requires Transmission-Based Precautions: No Hospitalized since last visit: No Patient Has Alerts: No Implantable device outside of the clinic excluding No cellular tissue based products placed in the center since last visit: Has Dressing in Place as Prescribed: Yes Pain Present Now: No Electronic Signature(s) Signed: 05/07/2023 2:02:59 PM By: Brenton Grills Entered By: Brenton Grills on 05/02/2023 16:09:58 -------------------------------------------------------------------------------- Encounter Discharge Information Details Patient Name: Date of Service: [name], [name] 05/02/2023 3:30 PM Medical Record Number: [id number] Patient Account Number: [id number] Date of Birth/Sex: Treating RN: 13-Aug-1941 (82 y.o. Gevena Mart Primary Care Jacquita Mulhearn: Aliene Beams Other Clinician: Referring Malyia Moro: Treating Maedell Hedger/Extender: Karna Dupes in Treatment:  2 Encounter Discharge Information Items Discharge Condition: Stable Ambulatory Status: Ambulatory Discharge Destination: Home Transportation: Private Auto Accompanied By: self Schedule Follow-up Appointment: Yes Clinical Summary of Care: Patient Declined Electronic Signature(s) Signed: 05/07/2023 2:02:59 PM By: Brenton Grills Entered By: Brenton Grills on 05/02/2023 16:11:58 [name] ([id number]) [id number] Page 2 of 4 -------------------------------------------------------------------------------- Patient/Caregiver Education Details Patient Name: Date of Service: [name], [name] 8/22/2024andnbsp3:30 PM Medical Record Number: [id number] Patient Account Number: [id number] Date of Birth/Gender: Treating RN: 1940/11/02 (82 y.o. Gevena Mart Primary Care Physician: Aliene Beams Other Clinician: Referring Physician: Treating Physician/Extender: Karna Dupes in Treatment: 2 Education Assessment Education Provided To: Patient Education Topics Provided Wound/Skin Impairment: Methods: Explain/Verbal Responses: State content correctly Electronic Signature(s) Signed: 05/07/2023 2:02:59 PM By: Brenton Grills Entered By: Brenton Grills on 05/02/2023 16:11:41 -------------------------------------------------------------------------------- Wound Assessment Details Patient Name: Date of Service: [name], [name] 05/02/2023 3:30 PM Medical Record Number: [id number] Patient Account Number: [id number] Date of Birth/Sex: Treating RN: 01-17-1941 (82 y.o. Gevena Mart Primary Care Kaydyn Sayas: Aliene Beams Other Clinician: Referring Maddalynn Barnard: Treating Clete Kuch/Extender: Karna Dupes in Treatment: 2 Wound Status Wound Number: 10 Primary Lymphedema Etiology: Wound Location: Left, Medial Lower Leg Secondary Diabetic Wound/Ulcer of the Lower Extremity Wounding Event: Gradually Appeared Etiology: Date  Acquired: 03/25/2023 Wound Open Weeks Of Treatment: 2 Status: Clustered Wound: No Comorbid Lymphedema, Congestive Heart Failure, Hypertension, Peripheral History: Venous Disease, Type II Diabetes, Osteoarthritis, Neuropathy, Confinement Anxiety Wound Measurements Length: (cm) 0.3 Width: (cm) 0.3 Depth: (cm) 0.1 Area: (cm) 0.071 Volume: (cm) 0.007 % Reduction in Area: 0% % Reduction in Volume: 0% Tunneling: No Undermining: No Wound Description Classification: Full Thickness Without Exposed Support Exudate Amount: Medium Exudate Type: Serous Exudate Color: amber Structures Periwound Skin Texture Texture Color [name], [name] ([id number]) [id number] Page 3 of 4 No Abnormalities Noted: No No Abnormalities Noted: No Moisture No Abnormalities Noted: No Electronic Signature(s) Signed: 05/07/2023 2:02:59 PM By: Brenton Grills Entered By: Brenton Grills on 05/02/2023 16:11:02 -------------------------------------------------------------------------------- Wound Assessment Details Patient Name: Date  of Service: [name], [name] 05/02/2023 3:30 PM Medical Record Number: [id number] Patient Account Number: [id number] Date of Birth/Sex: Treating RN: 01-14-1941 (82 y.o. Gevena Mart Primary Care Lanayah Gartley: Aliene Beams Other Clinician: Referring Lorilynn Lehr: Treating Marbeth Smedley/Extender: Karna Dupes in Treatment: 2 Wound Status Wound Number: 11 Primary Lymphedema Etiology: Wound Location: Right, Lateral Lower Leg Secondary Diabetic Wound/Ulcer of the Lower Extremity Wounding Event: Gradually Appeared Etiology: Date Acquired: 03/25/2023 Wound Open Weeks Of Treatment: 2 Status: Clustered Wound: No Comorbid Lymphedema, Congestive Heart Failure, Hypertension, Peripheral History: Venous Disease, Type II Diabetes, Osteoarthritis, Neuropathy, Confinement Anxiety Wound Measurements Length: (cm) 1.8 Width: (cm) 2.3 Depth: (cm)  0.1 Area: (cm) 3.252 Volume: (cm) 0.325 % Reduction in Area: 0% % Reduction in Volume: 0% Tunneling: No Undermining: No Wound Description Classification: Full Thickness Without Exposed Support Exudate Amount: Medium Exudate Type: Serous Exudate Color: amber Structures Periwound Skin Texture Texture Color No Abnormalities Noted: No No Abnormalities Noted: No Moisture No Abnormalities Noted: No Electronic Signature(s) Signed: 05/07/2023 2:02:59 PM By: Brenton Grills Entered By: Brenton Grills on 05/02/2023 16:11:10 -------------------------------------------------------------------------------- Vitals Details Patient Name: Date of Service: [name] 05/02/2023 3:30 PM Medical Record Number: [id number] Patient Account Number: [id number] Date of Birth/Sex: Treating RN: 1940/12/14 (82 y.o. Gevena Mart Primary Care Ej Pinson: Aliene Beams Other Clinician: [name] ([id number]) [id number] Page 4 of 4 Referring Lebaron Bautch: Treating Aylah Yeary/Extender: Karna Dupes in Treatment: 2 Vital Signs Time Taken: 15:30 Temperature (F): 98.1 Pulse (bpm): 80 Respiratory Rate (breaths/min): 18 Blood Pressure (mmHg): 169/73 Capillary Blood Glucose (mg/dl): 366 Reference Range: 80 - 120 mg / dl Electronic Signature(s) Signed: 05/07/2023 2:02:59 PM By: Brenton Grills Entered By: Brenton Grills on 05/02/2023 16:10:29

## 2023-05-07 NOTE — Progress Notes (Signed)
[name], [name] ([id number]) [id number] Page 1 of 1 Visit Report for 05/02/2023 SuperBill Details Patient Name: Date of Service: [name], [name] 05/02/2023 Medical Record Number: [id number] Patient Account Number: [id number] Date of Birth/Sex: Treating RN: 1941-02-13 (82 y.o. Gevena Mart Primary Care Provider: Aliene Beams Other Clinician: Referring Provider: Treating Provider/Extender: Karna Dupes in Treatment: 2 Diagnosis Coding ICD-10 Codes Code Description [phone] Non-pressure chronic ulcer of other part of right lower leg limited to breakdown of skin L97.821 Non-pressure chronic ulcer of other part of left lower leg limited to breakdown of skin I89.0 Lymphedema, not elsewhere classified I87.2 Venous insufficiency (chronic) (peripheral) E11.622 Type 2 diabetes mellitus with other skin ulcer I50.9 Heart failure, unspecified N18.4 Chronic kidney disease, stage 4 (severe) Facility Procedures CPT4 Description Modifier Quantity Code [id number] [phone] BILATERAL: Application of multi-layer venous compression system; leg (below knee), including ankle and 1 foot. ICD-10 Diagnosis Description L97.811 Non-pressure chronic ulcer of other part of right lower leg limited to breakdown of skin L97.821 Non-pressure chronic ulcer of other part of left lower leg limited to breakdown of skin I89.0 Lymphedema, not elsewhere classified Electronic Signature(s) Signed: 05/03/2023 12:35:56 PM By: Duanne Guess MD FACS Signed: 05/07/2023 2:02:59 PM By: Brenton Grills Entered By: Brenton Grills on 05/02/2023 16:12:23

## 2023-05-07 NOTE — Progress Notes (Signed)
CAIA, GITCHELL ([id number]) [id number] Page 1 of 11 Visit Report for 05/07/2023 Chief Complaint Document Details Patient Name: Date of Service: [name], [name] 05/07/2023 2:45 PM Medical Record Number: [id number] Patient Account Number: [id number] Date of Birth/Sex: Treating RN: 10-12-1940 (82 y.o. [name] Primary Care Provider: Aliene Beams Other Clinician: Referring Provider: Treating Provider/Extender: Karna Dupes in Treatment: 3 Information Obtained from: Patient Chief Complaint 03/28/2020; patient is here for review of wounds on her bilateral lower legs 12/25/2022; bilateral lower extremity wound 04/15/2023: BLE wounds Electronic Signature(s) Signed: 05/07/2023 3:57:48 PM By: Duanne Guess MD FACS Entered By: Duanne Guess on 05/07/2023 15:57:48 -------------------------------------------------------------------------------- HPI Details Patient Name: Date of Service: [name], [name] 05/07/2023 2:45 PM Medical Record Number: [id number] Patient Account Number: [id number] Date of Birth/Sex: Treating RN: 1941-01-08 (82 y.o. [name] Primary Care Provider: Aliene Beams Other Clinician: Referring Provider: Treating Provider/Extender: Karna Dupes in Treatment: 3 History of Present Illness HPI Description: ADMISSION 03/28/2020 This is a 82 year old woman who is accompanied by her daughter. She is moving to [place] from [place] where she lives. Infectious been going to the wound care center in [place] for the last 2 months. She apparently did not tolerate compression early on in that clinic stay. She is just using dry gauze over the wounds when she came in today. She tells me she has had both arterial and venous reflux studies although she does not have copies of these. She was apparently offered an ablation but I have no further information on that. She also is a type II  diabetic. She was noted to have wounds on her lower extremities during a CHF clinic visit with Dr. Gala Romney and she was referred here. She has several scattered areas on the right medial lower leg and a large area of superficial ulceration on the posterior medial left lower leg. past medical history; chronic kidney disease stage III, gastroesophageal reflux disease, type 2 diabetes with neuropathy, chronic diastolic heart failure, obstructive sleep apnea, hypertension, restless leg syndrome, pulmonary venous hypertension, history of colon CA, pacemaker and apparently venous reflux. We did not do arterial studies in the clinic today we are going to try to get the results that were already done within the last 2 months in [place] 7/26; patient readmitted to the clinic last week with predominantly chronic venous wounds almost circumferentially in the left lower leg and the right leg medially. She also has secondary lymphedema. We put her in compression. She went to the ER in [place] on Friday they remove the wrap on the left leg diagnosed her with cellulitis and put her on doxycycline. They apparently also did a ultrasound that was negative for DVT . We did not get any vascular information from [facility] where she apparently had arterial studies and venous studies. She is currently moving from [place] to [place] is up on her feet quite a bit. She was referred to vein and vascular here but never got to the appointment. ABIs were obtained here at 0.89 on the right and 1.02 on the left 8/9-Patient returns after establishing in clinic last visit, we have been doing 3 layer compression on both sides with calcium alginate, she is following up with the vein and vascular when she gets an appointment. The right side is healed the left side is still got some small open areas She has appt on Wed at Vein and vascular at which point she will come in for Nurse visit here Scheffler, Kathalene (  [id number])  [id number] Page 2 of 11 8/16; the patient arrives with all of her wounds closed. She has bilateral Juzo stockings. She saw Dr. Darrick Penna on 8/11 to review her reflux studies from the same day. On the right she had no evidence of DVT or SVT She did have deep venous reflux in the common femoral vein as well as superficial venous reflux in the . small saphenous vein of the mid calf on the left again no thrombus in the deep vein or superficial veins. Deep vein reflux in the common femoral vein as well as the great saphenous vein. She saw Dr. Darrick Penna in consult. Also of note he noted that she had a normal arterial scan done in [place] in April 2021, we were never able to obtain this. His feeling was that the patient did have a dilated greater saphenous vein however minimal to no evidence of reflux in her superficial venous system. She also was felt to have mild deep vein reflux and some degree of lymphedema. He recommended continued compression of the lower extremities with intermittent Unna boots if necessary. She was told to elevate her legs. If she develops recurrent ulcerations they were not opposed to revisiting the repeat venous reflux exam to see if she develops worsening reflux for consideration of laser ablation READMISSION 07/26/2020 Patient is now an 82 year old woman who lives in [place]. We had her in the clinic in the summer with wounds on her bilateral lower legs secondary to chronic venous insufficiency with some degree of lymphedema. She was seen by Dr. Darrick Penna in August. This was predominantly to review her venous reflux. She quoted normal arterial studies done in [place] in April 2021 but I have never been able to see these myself.Marland Kitchen His feeling was the patient did have a bilateral great dilated greater saphenous vein however minimal to no evidence of reflux in her superficial venous system. She was also felt to have mild deep vein reflux and some degree of  lymphedema. He recommended compression and leg elevation.Marland Kitchen He was not opposed to repeating her venous reflux studies at some point if she develops worse to see if she develops worsening reflux for consideration of ablation. We discharged the patient with juxta lite stockings bilaterally and she has been compliant with wearing these. Unfortunately in September she had a fall suffering a right tibial fracture requiring an IM nail with a rod. She also had fibular fractures in at least one place. She was sent to skilled facility for rehabilitation she is now back at home. She had a DVT rule out study on 9/24 that was negative for DVT . She tells [place] that in the last 2 or 3 weeks she is developed blisters on her right lower extremity that ruptured into 2 open wounds. She has not been able to get these to close over. She religiously uses her juxta lite stockings on both legs and she gets them on is tight as she can. Originally the left leg was the larger of the 2 legs but since the patient had surgery the right leg is more swollen and tense than the left. She also tells me she had a second fall yesterday and has had pain in the left dorsal midfoot. She has diabetic neuropathy with Charcot foot left greater than right. Past medical history includes chronic kidney disease, hypertension, type 2 diabetes with peripheral neuropathy, congestive heart failure, hypertension, history of colon CA, lumbar stenosis ABIs in our clinic were noncompressible bilaterally 11/30; the wounds in the  right lower extremity are healed anteriorly. She has both Tubigrip and her juxta lite stockings she uses the Tubigrip as the primary layer. We went over this with her today she has the bilateral stockings. She will need to adjust her compression to roughly 30 mmHg 12/25/2022 Mrs. Leonna Caird is a 82 year old female with a past medical history Of CKD stage IV, lymphedema/venous insufficiency, controlled type 2 diabetes  that presents the clinic for a 52-month history of bilateral lower extremity wounds. She has difficulty controlling the edema and is currently on metolazone 2.5 mg once weekly and torsemide 40 mg daily. She sleeps in a recliner. She has been using antibiotic ointment to the wound beds. She states she has Juxta lite compressions which she uses daily. She currently denies signs of infection. 4/23; patient presents for follow-up. We have been using Santyl and Hydrofera Blue under Kerlix/Coban to the lower extremities bilaterally. She tolerated this well. 4/30; patient presents for follow-up. We have been using silver alginate under 3 layer compression. Patient did not tolerate the wraps and took these off last week. She has significant swelling on exam. She does report that her cardiologist recommended increasing the diuretic regimen at one point to help with her lower extremity edema however she did not do this. 5/6; we have been using silver alginate and Coflex Unna boot. She was able to tolerate this. She does not tolerate tight compression well. She has wounds on her bilateral lower extremities some of which are measuring smaller the area on the left medial is unchanged. The patient sleeps in her recliner tells me that she is not able to elevate her legs much because of musculoskeletal pain. She has a history of having external compression stockings but they are old and will probably need a new pair she asked [place] to order them for her room ALT I told her we do so when she is close to healing 5/21; patient presents for follow-up. She missed her last clinic appointment due to not feeling well. She took off the compression wraps and has been keeping the areas covered and using compression stockings. She did tolerate the 3 layer compression wrap well when placed during the nurse visit on 5/7. She has a new wound to the lateral left leg. She denies signs of infection. READMISSION 04/15/2023: She returns  to clinic today with new bilateral lower extremity wounds. She says that her legs started weeping and her daughter applied Neosporin, Kerlix and Coban. This seemed to help the swelling tremendously but she still has small open areas on her legs. Her Fabian November wraps are old and stretched out and not providing adequate compression at this time. 05/07/2023: She has only had RN visits the past 2 weeks due to clinic scheduling issues. Her legs are markedly edematous and she has a new ulcer that has opened up on the left anterior lower leg. Electronic Signature(s) Signed: 05/07/2023 4:09:45 PM By: Duanne Guess MD FACS Previous Signature: 05/07/2023 4:01:05 PM Version By: Duanne Guess MD FACS Entered By: Duanne Guess on 05/07/2023 16:09:44 -------------------------------------------------------------------------------- Physical Exam Details Patient Name: Date of Service: [name], [name] 05/07/2023 2:45 PM Medical Record Number: [id number] Patient Account Number: [id number] Date of Birth/Sex: Treating RN: 01-29-1941 (82 y.o. [name] Primary Care Provider: Aliene Beams Other Clinician: [name] ([id number]) [id number] Page 3 of 11 Referring Provider: Treating Provider/Extender: Karna Dupes in Treatment: 3 Constitutional Hypertensive, asymptomatic. . . . no acute distress. Respiratory Normal work of breathing on room air.  Notes 05/07/2023: Her legs are markedly edematous and she has a new ulcer that has opened up on the left anterior lower leg. Electronic Signature(s) Signed: 05/07/2023 4:03:48 PM By: Duanne Guess MD FACS Previous Signature: 05/07/2023 4:03:05 PM Version By: Duanne Guess MD FACS Entered By: Duanne Guess on 05/07/2023 16:03:47 -------------------------------------------------------------------------------- Physician Orders Details Patient Name: Date of Service: [name], [name] 05/07/2023 2:45  PM Medical Record Number: [id number] Patient Account Number: [id number] Date of Birth/Sex: Treating RN: 1941-01-01 (82 y.o. Billy Coast, Bonita Quin Primary Care Provider: Aliene Beams Other Clinician: Referring Provider: Treating Provider/Extender: Karna Dupes in Treatment: 3 Verbal / Phone Orders: No Diagnosis Coding ICD-10 Coding Code Description L97.811 Non-pressure chronic ulcer of other part of right lower leg limited to breakdown of skin L97.821 Non-pressure chronic ulcer of other part of left lower leg limited to breakdown of skin I89.0 Lymphedema, not elsewhere classified I87.2 Venous insufficiency (chronic) (peripheral) E11.622 Type 2 diabetes mellitus with other skin ulcer I50.9 Heart failure, unspecified N18.4 Chronic kidney disease, stage 4 (severe) Follow-up Appointments ppointment in 1 week. - Dr. Lady Gary RM 3 Return A Thursday 9/5 @ 10:15 am Bathing/ Shower/ Hygiene May shower with protection but do not get wound dressing(s) wet. Protect dressing(s) with water repellant cover (for example, large plastic bag) or a cast cover and may then take shower. Edema Control - Lymphedema / SCD / Other Elevate legs to the level of the heart or above for 30 minutes daily and/or when sitting for 3-4 times a day throughout the day. Avoid standing for long periods of time. Exercise regularly Wound Treatment Wound #10 - Lower Leg Wound Laterality: Left, Medial Peri-Wound Care: Sween Lotion (Moisturizing lotion) 1 x Per Week/30 Days Discharge Instructions: Apply moisturizing lotion as directed Prim Dressing: Maxorb Extra Ag+ Alginate Dressing, 4x4.75 (in/in) 1 x Per Week/30 Days ary Discharge Instructions: Apply to wound bed as instructed Secondary Dressing: ABD Pad, 8x10 1 x Per Week/30 Days Discharge Instructions: Apply over primary dressing as directed. Compression Wrap: Urgo K2 Lite, (equivalent to a 3 layer) two layer compression system, regular 1 x Per  Week/30 Days Discharge Instructions: Apply Urgo K2 Lite as directed (alternative to 3 layer compression). HAIDI, ABSTON ([id number]) [id number] Page 4 of 11 Wound #11 - Lower Leg Wound Laterality: Right, Lateral Peri-Wound Care: Sween Lotion (Moisturizing lotion) 1 x Per Week/30 Days Discharge Instructions: Apply moisturizing lotion as directed Prim Dressing: Maxorb Extra Ag+ Alginate Dressing, 4x4.75 (in/in) 1 x Per Week/30 Days ary Discharge Instructions: Apply to wound bed as instructed Secondary Dressing: ABD Pad, 8x10 1 x Per Week/30 Days Discharge Instructions: Apply over primary dressing as directed. Compression Wrap: Urgo K2 Lite, (equivalent to a 3 layer) two layer compression system, regular 1 x Per Week/30 Days Discharge Instructions: Apply Urgo K2 Lite as directed (alternative to 3 layer compression). Wound #12 - Lower Leg Wound Laterality: Left, Anterior Peri-Wound Care: Sween Lotion (Moisturizing lotion) 1 x Per Week/30 Days Discharge Instructions: Apply moisturizing lotion as directed Prim Dressing: Maxorb Extra Ag+ Alginate Dressing, 4x4.75 (in/in) 1 x Per Week/30 Days ary Discharge Instructions: Apply to wound bed as instructed Secondary Dressing: ABD Pad, 8x10 1 x Per Week/30 Days Discharge Instructions: Apply over primary dressing as directed. Secondary Dressing: Zetuvit Plus 4x8 in 1 x Per Week/30 Days Discharge Instructions: Apply over primary dressing as directed. Compression Wrap: Urgo K2 Lite, (equivalent to a 3 layer) two layer compression system, regular 1 x Per Week/30 Days Discharge Instructions: Apply Urgo K2 Lite as  directed (alternative to 3 layer compression). Electronic Signature(s) Signed: 05/07/2023 4:13:25 PM By: Duanne Guess MD FACS Entered By: Duanne Guess on 05/07/2023 16:04:27 -------------------------------------------------------------------------------- Problem List Details Patient Name: Date of  Service: CHETARA, THADEN 05/07/2023 2:45 PM Medical Record Number: [id number] Patient Account Number: [id number] Date of Birth/Sex: Treating RN: 1941-03-19 (82 y.o. Billy Coast, Linda Primary Care Provider: Aliene Beams Other Clinician: Referring Provider: Treating Provider/Extender: Karna Dupes in Treatment: 3 Active Problems ICD-10 Encounter Code Description Active Date MDM Diagnosis L97.811 Non-pressure chronic ulcer of other part of right lower leg limited to breakdown 04/15/2023 No Yes of skin L97.821 Non-pressure chronic ulcer of other part of left lower leg limited to breakdown 04/15/2023 No Yes of skin I89.0 Lymphedema, not elsewhere classified 04/15/2023 No Yes I87.2 Venous insufficiency (chronic) (peripheral) 04/15/2023 No Yes Millan, Bonnita ([id number]) [phone].pdf Page 5 of 11 E11.622 Type 2 diabetes mellitus with other skin ulcer 04/15/2023 No Yes I50.9 Heart failure, unspecified 04/15/2023 No Yes N18.4 Chronic kidney disease, stage 4 (severe) 04/15/2023 No Yes Inactive Problems Resolved Problems Electronic Signature(s) Signed: 05/07/2023 3:56:50 PM By: Duanne Guess MD FACS Entered By: Duanne Guess on 05/07/2023 15:56:49 -------------------------------------------------------------------------------- Progress Note Details Patient Name: Date of Service: VICTORINE, GOPAL 05/07/2023 2:45 PM Medical Record Number: [id number] Patient Account Number: [id number] Date of Birth/Sex: Treating RN: [birth year] (82 y.o. [name] Primary Care Provider: Aliene Beams Other Clinician: Referring Provider: Treating Provider/Extender: Karna Dupes in Treatment: 3 Subjective Chief Complaint Information obtained from Patient 03/28/2020; patient is here for review of wounds on her bilateral lower legs 12/25/2022; bilateral lower extremity wound 04/15/2023: BLE wounds History of Present Illness  (HPI) ADMISSION 03/28/2020 This is a 82 year old woman who is accompanied by her daughter. She is moving to [place] from [place] where she lives. Infectious been going to the wound care center in [place] for the last 2 months. She apparently did not tolerate compression early on in that clinic stay. She is just using dry gauze over the wounds when she came in today. She tells me she has had both arterial and venous reflux studies although she does not have copies of these. She was apparently offered an ablation but I have no further information on that. She also is a type II diabetic. She was noted to have wounds on her lower extremities during a CHF clinic visit with Dr. Gala Romney and she was referred here. She has several scattered areas on the right medial lower leg and a large area of superficial ulceration on the posterior medial left lower leg. past medical history; chronic kidney disease stage III, gastroesophageal reflux disease, type 2 diabetes with neuropathy, chronic diastolic heart failure, obstructive sleep apnea, hypertension, restless leg syndrome, pulmonary venous hypertension, history of colon CA, pacemaker and apparently venous reflux. We did not do arterial studies in the clinic today we are going to try to get the results that were already done within the last 2 months in [place] 7/26; patient readmitted to the clinic last week with predominantly chronic venous wounds almost circumferentially in the left lower leg and the right leg medially. She also has secondary lymphedema. We put her in compression. She went to the ER in [place] on Friday they remove the wrap on the left leg diagnosed her with cellulitis and put her on doxycycline. They apparently also did a ultrasound that was negative for DVT . We did not get any vascular information from [facility] where she apparently had arterial studies  and venous studies. She is currently moving from [place] to  [place] is up on her feet quite a bit. She was referred to vein and vascular here but never got to the appointment. ABIs were obtained here at 0.89 on the right and 1.02 on the left 8/9-Patient returns after establishing in clinic last visit, we have been doing 3 layer compression on both sides with calcium alginate, she is following up with the vein and vascular when she gets an appointment. The right side is healed the left side is still got some small open areas She has appt on Wed at Vein and vascular at which point she will come in for Nurse visit here 8/16; the patient arrives with all of her wounds closed. She has bilateral Juzo stockings. She saw Dr. Darrick Penna on 8/11 to review her reflux studies from the same day. On the right she had no evidence of DVT or SVT She did have deep venous reflux in the common femoral vein as well as superficial venous reflux in the . small saphenous vein of the mid calf on the left again no thrombus in the deep vein or superficial veins. Deep vein reflux in the common femoral vein as well as the great saphenous vein. She saw Dr. Darrick Penna in consult. Also of note he noted that she had a normal arterial scan done in [place] in April 2021, we were never able to obtain this. His feeling was that the patient did have a dilated greater saphenous vein however minimal to no evidence of reflux in her superficial Szumski, Baneza ([id number]) [id number] Page 6 of 11 venous system. She also was felt to have mild deep vein reflux and some degree of lymphedema. He recommended continued compression of the lower extremities with intermittent Unna boots if necessary. She was told to elevate her legs. If she develops recurrent ulcerations they were not opposed to revisiting the repeat venous reflux exam to see if she develops worsening reflux for consideration of laser ablation READMISSION 07/26/2020 Patient is now an 82 year old woman who lives in  [place]. We had her in the clinic in the summer with wounds on her bilateral lower legs secondary to chronic venous insufficiency with some degree of lymphedema. She was seen by Dr. Darrick Penna in August. This was predominantly to review her venous reflux. She quoted normal arterial studies done in [place] in April 2021 but I have never been able to see these myself.Marland Kitchen His feeling was the patient did have a bilateral great dilated greater saphenous vein however minimal to no evidence of reflux in her superficial venous system. She was also felt to have mild deep vein reflux and some degree of lymphedema. He recommended compression and leg elevation.Marland Kitchen He was not opposed to repeating her venous reflux studies at some point if she develops worse to see if she develops worsening reflux for consideration of ablation. We discharged the patient with juxta lite stockings bilaterally and she has been compliant with wearing these. Unfortunately in September she had a fall suffering a right tibial fracture requiring an IM nail with a rod. She also had fibular fractures in at least one place. She was sent to skilled facility for rehabilitation she is now back at home. She had a DVT rule out study on 9/24 that was negative for DVT . She tells [place] that in the last 2 or 3 weeks she is developed blisters on her right lower extremity that ruptured into 2 open wounds. She has not been able to  get these to close over. She religiously uses her juxta lite stockings on both legs and she gets them on is tight as she can. Originally the left leg was the larger of the 2 legs but since the patient had surgery the right leg is more swollen and tense than the left. She also tells me she had a second fall yesterday and has had pain in the left dorsal midfoot. She has diabetic neuropathy with Charcot foot left greater than right. Past medical history includes chronic kidney disease, hypertension, type 2 diabetes with peripheral  neuropathy, congestive heart failure, hypertension, history of colon CA, lumbar stenosis ABIs in our clinic were noncompressible bilaterally 11/30; the wounds in the right lower extremity are healed anteriorly. She has both Tubigrip and her juxta lite stockings she uses the Tubigrip as the primary layer. We went over this with her today she has the bilateral stockings. She will need to adjust her compression to roughly 30 mmHg 12/25/2022 Mrs. Makeba Millman is a 82 year old female with a past medical history Of CKD stage IV, lymphedema/venous insufficiency, controlled type 2 diabetes that presents the clinic for a 21-month history of bilateral lower extremity wounds. She has difficulty controlling the edema and is currently on metolazone 2.5 mg once weekly and torsemide 40 mg daily. She sleeps in a recliner. She has been using antibiotic ointment to the wound beds. She states she has Juxta lite compressions which she uses daily. She currently denies signs of infection. 4/23; patient presents for follow-up. We have been using Santyl and Hydrofera Blue under Kerlix/Coban to the lower extremities bilaterally. She tolerated this well. 4/30; patient presents for follow-up. We have been using silver alginate under 3 layer compression. Patient did not tolerate the wraps and took these off last week. She has significant swelling on exam. She does report that her cardiologist recommended increasing the diuretic regimen at one point to help with her lower extremity edema however she did not do this. 5/6; we have been using silver alginate and Coflex Unna boot. She was able to tolerate this. She does not tolerate tight compression well. She has wounds on her bilateral lower extremities some of which are measuring smaller the area on the left medial is unchanged. The patient sleeps in her recliner tells me that she is not able to elevate her legs much because of musculoskeletal pain. She has a history of having  external compression stockings but they are old and will probably need a new pair she asked [place] to order them for her room ALT I told her we do so when she is close to healing 5/21; patient presents for follow-up. She missed her last clinic appointment due to not feeling well. She took off the compression wraps and has been keeping the areas covered and using compression stockings. She did tolerate the 3 layer compression wrap well when placed during the nurse visit on 5/7. She has a new wound to the lateral left leg. She denies signs of infection. READMISSION 04/15/2023: She returns to clinic today with new bilateral lower extremity wounds. She says that her legs started weeping and her daughter applied Neosporin, Kerlix and Coban. This seemed to help the swelling tremendously but she still has small open areas on her legs. Her Fabian November wraps are old and stretched out and not providing adequate compression at this time. 05/07/2023: She has only had RN visits the past 2 weeks due to clinic scheduling issues. Her legs are markedly edematous and she has a  new ulcer that has opened up on the left anterior lower leg. Patient History Information obtained from Patient. Family History Diabetes - Siblings, Heart Disease - Mother, Hypertension - Mother, Kidney Disease - Siblings, No family history of Cancer, Hereditary Spherocytosis, Lung Disease, Seizures, Stroke, Thyroid Problems, Tuberculosis. Social History Never smoker, Marital Status - Widowed, Alcohol Use - Rarely, Drug Use - No History, Caffeine Use - Daily. Medical History Eyes Denies history of Cataracts, Glaucoma, Optic Neuritis Ear/Nose/Mouth/Throat Denies history of Chronic sinus problems/congestion, Middle ear problems Hematologic/Lymphatic Patient has history of Lymphedema Denies history of Anemia, Hemophilia, Human Immunodeficiency Virus, Sickle Cell Disease Respiratory Denies history of Aspiration, Asthma, Chronic Obstructive Pulmonary  Disease (COPD), Pneumothorax, Sleep Apnea, Tuberculosis Cardiovascular Patient has history of Congestive Heart Failure, Hypertension, Peripheral Venous Disease Denies history of Angina, Arrhythmia, Coronary Artery Disease, Deep Vein Thrombosis, Hypotension, Myocardial Infarction, Peripheral Arterial Disease, Phlebitis, Vasculitis Gastrointestinal Denies history of Cirrhosis , Colitis, Crohns, Hepatitis A, Hepatitis B, Hepatitis C Endocrine Morissette, Cheyene ([id number]) [phone].pdf Page 7 of 11 Patient has history of Type II Diabetes Denies history of Type I Diabetes Genitourinary Denies history of End Stage Renal Disease Immunological Denies history of Lupus Erythematosus, Raynauds, Scleroderma Integumentary (Skin) Denies history of History of Burn Musculoskeletal Patient has history of Osteoarthritis Neurologic Patient has history of Neuropathy Denies history of Dementia, Quadriplegia, Paraplegia, Seizure Disorder Oncologic Denies history of Received Chemotherapy, Received Radiation Psychiatric Patient has history of Confinement Anxiety Denies history of Anorexia/bulimia Hospitalization/Surgery History - tibia nail insertion right. - cardiac cath. - lumbar laminectomy. - pacemaker implanted. - appendectomy. - lumpectomy left breast. - DandC uterus. - partial nephrectomy. - tosillectomy. - tubal ligation. Medical A Surgical History Notes nd Constitutional Symptoms (General Health) obesity Cardiovascular dyslipidemia, pacemaker Gastrointestinal GERD Endocrine thyroid nodule Genitourinary CKD stage 3 Musculoskeletal fibromyalgia, lumbar spinal stenosis Oncologic hx colon CA Objective Constitutional Hypertensive, asymptomatic. no acute distress. Vitals Time Taken: 2:51 PM, Temperature: 98.5 F, Pulse: 77 bpm, Respiratory Rate: 20 breaths/min, Blood Pressure: 162/72 mmHg, Capillary Blood Glucose: 67 mg/dl. General Notes: glucose per pt report this  am Respiratory Normal work of breathing on room air. General Notes: 05/07/2023: Her legs are markedly edematous and she has a new ulcer that has opened up on the left anterior lower leg. Integumentary (Hair, Skin) Wound #10 status is Open. Original cause of wound was Gradually Appeared. The date acquired was: 03/25/2023. The wound has been in treatment 3 weeks. The wound is located on the Left,Medial Lower Leg. The wound measures 3.3cm length x 1.8cm width x 0.1cm depth; 4.665cm^2 area and 0.467cm^3 volume. There is Fat Layer (Subcutaneous Tissue) exposed. There is no tunneling or undermining noted. There is a medium amount of serous drainage noted. The wound margin is flat and intact. There is large (67-100%) red granulation within the wound bed. There is no necrotic tissue within the wound bed. The periwound skin appearance had no abnormalities noted for texture. The periwound skin appearance had no abnormalities noted for moisture. The periwound skin appearance exhibited: Hemosiderin Staining. Periwound temperature was noted as No Abnormality. Wound #11 status is Open. Original cause of wound was Gradually Appeared. The date acquired was: 03/25/2023. The wound has been in treatment 3 weeks. The wound is located on the Right,Lateral Lower Leg. The wound measures 2.5cm length x 4cm width x 0.1cm depth; 7.854cm^2 area and 0.785cm^3 volume. There is Fat Layer (Subcutaneous Tissue) exposed. There is no tunneling or undermining noted. There is a medium amount of serous drainage noted. The wound  margin is flat and intact. There is large (67-100%) red granulation within the wound bed. There is no necrotic tissue within the wound bed. The periwound skin appearance had no abnormalities noted for texture. The periwound skin appearance had no abnormalities noted for moisture. The periwound skin appearance exhibited: Hemosiderin Staining. Periwound temperature was noted as No Abnormality. Wound #12 status is  Open. Original cause of wound was Gradually Appeared. The date acquired was: 05/07/2023. The wound is located on the Left,Anterior Lower Leg. The wound measures 3.3cm length x 4cm width x 0.1cm depth; 10.367cm^2 area and 1.037cm^3 volume. There is Fat Layer (Subcutaneous Tissue) exposed. There is no tunneling or undermining noted. There is a large amount of serous drainage noted. The wound margin is indistinct and nonvisible. There is large (67-100%) red granulation within the wound bed. There is no necrotic tissue within the wound bed. The periwound skin appearance had no abnormalities noted for moisture. The periwound skin appearance exhibited: Excoriation, Hemosiderin Staining. Periwound temperature was noted as No Abnormality. 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 There was a Double Layer Compression Therapy Procedure by Zenaida Deed, RN. Post procedure Diagnosis Wound #10: Same as Pre-Procedure Notes: urgo lite. Wound #11 Pre-procedure diagnosis of Wound #11 is a Lymphedema located on the Right,Lateral Lower Leg . There was a Double Layer Compression Therapy Procedure by Zenaida Deed, RN. Post procedure Diagnosis Wound #11: Same as Pre-Procedure Notes: urgo lite. Plan Follow-up Appointments: Return Appointment in 1 week. - Dr. Lady Gary RM 3 Thursday 9/5 @ 10:15 am Bathing/ Shower/ Hygiene: May shower with protection but do not  get wound dressing(s) wet. Protect dressing(s) with water repellant cover (for example, large plastic bag) or a cast cover and may then take shower. Edema Control - Lymphedema / SCD / Other: Elevate legs to the level of the heart or above for 30 minutes daily and/or when sitting for 3-4 times a day throughout the day. Avoid standing for long periods of time. Exercise regularly WOUND #10: - Lower Leg Wound Laterality: Left, Medial Peri-Wound Care: Sween Lotion (Moisturizing lotion) 1 x Per Week/30 Days Discharge Instructions: Apply moisturizing lotion as directed Prim Dressing: Maxorb Extra Ag+ Alginate Dressing, 4x4.75 (in/in) 1 x Per Week/30 Days ary Discharge Instructions: Apply to wound bed as instructed Secondary Dressing: ABD Pad, 8x10 1 x Per Week/30 Days Discharge Instructions: Apply over primary dressing as directed. Com pression Wrap: Urgo K2 Lite, (equivalent to a 3 layer) two layer compression system, regular 1 x Per Week/30 Days Discharge Instructions: Apply Urgo K2 Lite as directed (alternative to 3 layer compression). WOUND #11: - Lower Leg Wound Laterality: Right, Lateral Peri-Wound Care: Sween Lotion (Moisturizing lotion) 1 x Per Week/30 Days Discharge Instructions: Apply moisturizing lotion as directed Prim Dressing: Maxorb Extra Ag+ Alginate Dressing, 4x4.75 (in/in) 1 x Per Week/30 Days ary Discharge Instructions: Apply to wound bed as instructed Secondary Dressing: ABD Pad, 8x10 1 x Per Week/30 Days Discharge Instructions: Apply over primary dressing as directed. Com pression Wrap: Urgo K2 Lite, (equivalent to a 3 layer) two  layer compression system, regular 1 x Per Week/30 Days Discharge Instructions: Apply Urgo K2 Lite as directed (alternative to 3 layer compression). WOUND #12: - Lower Leg Wound Laterality: Left, Anterior Peri-Wound Care: Sween Lotion (Moisturizing lotion) 1 x Per Week/30 Days Discharge Instructions: Apply moisturizing lotion as directed Prim  Dressing: Maxorb Extra Ag+ Alginate Dressing, 4x4.75 (in/in) 1 x Per Week/30 Days ary Discharge Instructions: Apply to wound bed as instructed Secondary Dressing: ABD Pad, 8x10 1 x Per Week/30 Days Discharge Instructions: Apply over primary dressing as directed. Secondary Dressing: Zetuvit Plus 4x8 in 1 x Per Week/30 Days Discharge Instructions: Apply over primary dressing as directed. Com pression Wrap: Urgo K2 Lite, (equivalent to a 3 layer) two layer compression system, regular 1 x Per Week/30 Days Discharge Instructions: Apply Urgo K2 Lite as directed (alternative to 3 layer compression). 05/07/2023: Her legs are markedly edematous and she has a new ulcer that has opened up on the left anterior lower leg. All of the wounds are very clean without any slough accumulation, but she is draining copious amounts of fluid from all of the open sites. We will apply silver alginate and I have convinced her to allow [place] to apply an Urgo lite wrap rather than the loosely applied Kerlix and Coban she had on. Follow-up in 1 week. Electronic Signature(s) Signed: 05/07/2023 4:10:04 PM By: Duanne Guess MD FACS Previous Signature: 05/07/2023 4:05:45 PM Version By: Duanne Guess MD FACS [place], Bosie Clos ([id number]) [id number] Page 9 of 11 Previous Signature: 05/07/2023 4:05:45 PM Version By: Duanne Guess MD FACS Entered By: Duanne Guess on 05/07/2023 16:10:03 -------------------------------------------------------------------------------- HxROS Details Patient Name: Date of Service: [name], [name] 05/07/2023 2:45 PM Medical Record Number: [id number] Patient Account Number: [id number] Date of Birth/Sex: Treating RN: 1941-07-19 (82 y.o. [name] Primary Care Provider: Aliene Beams Other Clinician: Referring Provider: Treating Provider/Extender: Karna Dupes in Treatment: 3 Information Obtained From Patient Constitutional Symptoms  (General Health) Medical History: Past Medical History Notes: obesity Eyes Medical History: Negative for: Cataracts; Glaucoma; Optic Neuritis Ear/Nose/Mouth/Throat Medical History: Negative for: Chronic sinus problems/congestion; Middle ear problems Hematologic/Lymphatic Medical History: Positive for: Lymphedema Negative for: Anemia; Hemophilia; Human Immunodeficiency Virus; Sickle Cell Disease Respiratory Medical History: Negative for: Aspiration; Asthma; Chronic Obstructive Pulmonary Disease (COPD); Pneumothorax; Sleep Apnea; Tuberculosis Cardiovascular Medical History: Positive for: Congestive Heart Failure; Hypertension; Peripheral Venous Disease Negative for: Angina; Arrhythmia; Coronary Artery Disease; Deep Vein Thrombosis; Hypotension; Myocardial Infarction; Peripheral Arterial Disease; Phlebitis; Vasculitis Past Medical History Notes: dyslipidemia, pacemaker Gastrointestinal Medical History: Negative for: Cirrhosis ; Colitis; Crohns; Hepatitis A; Hepatitis B; Hepatitis C Past Medical History Notes: GERD Endocrine Medical History: Positive for: Type II Diabetes Negative for: Type I Diabetes Past Medical History Notes: thyroid nodule Time with diabetes: 15 Treated with: Insulin Blood sugar tested every day: Yes Tested : 2-3 times per day Genitourinary Medical HistoryCALEEN, [name] ([id number]) [id number] Page 10 of 11 Negative for: End Stage Renal Disease Past Medical History Notes: CKD stage 3 Immunological Medical History: Negative for: Lupus Erythematosus; Raynauds; Scleroderma Integumentary (Skin) Medical History: Negative for: History of Burn Musculoskeletal Medical History: Positive for: Osteoarthritis Past Medical History Notes: fibromyalgia, lumbar spinal stenosis Neurologic Medical History: Positive for: Neuropathy Negative for: Dementia; Quadriplegia; Paraplegia; Seizure Disorder Oncologic Medical  History: Negative for: Received Chemotherapy; Received Radiation Past Medical History Notes: hx colon CA Psychiatric Medical History: Positive for: Confinement Anxiety Negative for: Anorexia/bulimia Immunizations Pneumococcal Vaccine: Received Pneumococcal Vaccination: No Implantable Devices Yes Hospitalization / Surgery History Type of Hospitalization/Surgery  tibia nail insertion right cardiac cath lumbar laminectomy pacemaker implanted appendectomy lumpectomy left breast DandC uterus partial nephrectomy tosillectomy tubal ligation Family and Social History Cancer: No; Diabetes: Yes - Siblings; Heart Disease: Yes - Mother; Hereditary Spherocytosis: No; Hypertension: Yes - Mother; Kidney Disease: Yes - Siblings; Lung Disease: No; Seizures: No; Stroke: No; Thyroid Problems: No; Tuberculosis: No; Never smoker; Marital Status - Widowed; Alcohol Use: Rarely; Drug Use: No History; Caffeine Use: Daily; Financial Concerns: No; Food, Clothing or Shelter Needs: No; Support System Lacking: No; Transportation Concerns: No Psychologist, prison and probation services) Signed: 05/07/2023 4:13:25 PM By: Duanne Guess MD FACS Signed: 05/07/2023 4:18:27 PM By: Zenaida Deed RN, BSN Entered By: Duanne Guess on 05/07/2023 16:02:39 [name] ([id number]) [id number] Page 11 of 11 -------------------------------------------------------------------------------- SuperBill Details Patient Name: Date of Service: EDIT, [name] 05/07/2023 Medical Record Number: [id number] Patient Account Number: [id number] Date of Birth/Sex: Treating RN: [birth year] (82 y.o. [name] Primary Care Provider: Aliene Beams Other Clinician: Referring Provider: Treating Provider/Extender: Karna Dupes in Treatment: 3 Diagnosis Coding ICD-10 Codes Code Description [id number] Non-pressure chronic ulcer of other part of right lower leg limited to breakdown of  skin L97.821 Non-pressure chronic ulcer of other part of left lower leg limited to breakdown of skin I89.0 Lymphedema, not elsewhere classified I87.2 Venous insufficiency (chronic) (peripheral) E11.622 Type 2 diabetes mellitus with other skin ulcer I50.9 Heart failure, unspecified N18.4 Chronic kidney disease, stage 4 (severe) Facility Procedures : CPT4: Code [id number] Description: 81 BILATERAL: Application of multi-layer venous compression system; leg (below knee), including ankle and t. Modifier: Quantity: 1 Physician Procedures : CPT4 Code Description Modifier [id number] - WC PHYS LEVEL 3 - EST PT ICD-10 Diagnosis Description L97.811 Non-pressure chronic ulcer of other part of right lower leg limited to breakdown of skin L97.821 Non-pressure chronic ulcer of other part of  left lower leg limited to breakdown of skin I89.0 Lymphedema, not elsewhere classified I87.2 Venous insufficiency (chronic) (peripheral) Quantity: 1 Electronic Signature(s) Signed: 05/07/2023 4:08:49 PM By: Duanne Guess MD FACS Entered By: Duanne Guess on 05/07/2023 16:08:48

## 2023-05-07 NOTE — Progress Notes (Signed)
ARNETT, CARDY ([id number]) [id number] Page 1 of 13 Visit Report for 05/07/2023 Arrival Information Details Patient Name: Date of Service: [name], [name] 05/07/2023 2:45 PM Medical Record Number: [id number] Patient Account Number: [id number] Date of Birth/Sex: Treating RN: December 08, 1940 (82 y.o. [name], [name] Primary Care [name]: [name] Other Clinician: Referring [name]: Treating [name]/Extender: [name] in Treatment: 3 Visit Information History Since Last Visit Added or deleted any medications: No Patient Arrived: [name] Any new allergies or adverse reactions: No Arrival Time: 14:47 Had a fall or experienced change in No Accompanied By: self activities of daily living that may affect Transfer Assistance: None risk of falls: Patient Identification Verified: Yes Signs or symptoms of abuse/neglect since last visito No Secondary Verification Process Completed: Yes Hospitalized since last visit: No Patient Requires Transmission-Based Precautions: No Implantable device outside of the clinic excluding No Patient Has Alerts: No cellular tissue based products placed in the center since last visit: Has Dressing in Place as Prescribed: Yes Has Compression in Place as Prescribed: Yes Pain Present Now: Yes Electronic Signature(s) Signed: 05/07/2023 4:18:27 PM By: Zenaida Deed RN, BSN Entered By: Zenaida Deed on 05/07/2023 14:50:11 -------------------------------------------------------------------------------- Compression Therapy Details Patient Name: Date of Service: [name], [name] 05/07/2023 2:45 PM Medical Record Number: [id number] Patient Account Number: [id number] Date of Birth/Sex: Treating RN: 02-Aug-1941 (82 y.o. [name] Primary Care Salisha Bardsley: [name] Other Clinician: Referring [name]: Treating [name]/Extender: [name] in Treatment: 3 Compression  Therapy Performed for Wound Assessment: Wound #10 Left,Medial Lower Leg Performed By: Clinician Zenaida Deed, RN Compression Type: Double Layer Post Procedure Diagnosis Same as Pre-procedure Notes urgo lite Electronic Signature(s) Signed: 05/07/2023 4:18:27 PM By: Zenaida Deed RN, BSN Entered By: Zenaida Deed on 05/07/2023 15:39:45 [name] ([id number]) [id number] Page 2 of 13 -------------------------------------------------------------------------------- Compression Therapy Details Patient Name: Date of Service: [name], [name] 05/07/2023 2:45 PM Medical Record Number: [id number] Patient Account Number: [id number] Date of Birth/Sex: Treating RN: 31-Mar-1941 (82 y.o. [name] Primary Care Cina Klumpp: [name] Other Clinician: Referring Alondra Vandeven: Treating [name]/Extender: [name] in Treatment: 3 Compression Therapy Performed for Wound Assessment: Wound #11 Right,Lateral Lower Leg Performed By: Clinician Zenaida Deed, RN Compression Type: Double Layer Post Procedure Diagnosis Same as Pre-procedure Notes urgo lite Electronic Signature(s) Signed: 05/07/2023 4:18:27 PM By: Zenaida Deed RN, BSN Entered By: Zenaida Deed on 05/07/2023 15:39:45 -------------------------------------------------------------------------------- Encounter Discharge Information Details Patient Name: Date of Service: [name], [name] 05/07/2023 2:45 PM Medical Record Number: [id number] Patient Account Number: [id number] Date of Birth/Sex: Treating RN: 08-Jan-1941 (82 y.o. [name] Primary Care Pahola Dimmitt: [name] Other Clinician: Referring Kimberlin Scheel: Treating [name]/Extender: [name] in Treatment: 3 Encounter Discharge Information Items Discharge Condition: Stable Ambulatory Status: Cane Discharge Destination: Home Transportation: Private Auto Accompanied By:  self Schedule Follow-up Appointment: Yes Clinical Summary of Care: Patient Declined Electronic Signature(s) Signed: 05/07/2023 4:18:27 PM By: Zenaida Deed RN, BSN Entered By: Zenaida Deed on 05/07/2023 15:44:33 -------------------------------------------------------------------------------- Lower Extremity Assessment Details Patient Name: Date of Service: [name], [name] 05/07/2023 2:45 PM Medical Record Number: [id number] Patient Account Number: [id number] Date of Birth/Sex: Treating RN: 10/18/1940 (82 y.o. [name] Primary Care Corita Allinson: [name] Other Clinician: Referring [name]: Treating [name]/Extender: [name], [name] ([id number]) [id number] Page 3 of 13 Weeks in Treatment: 3 Edema Assessment Assessed: [Left: No] [Right: No] Edema: [Left: Yes] [Right: Yes] Calf Left: Right: Point of Measurement: From Medial Instep 43.3 cm 49 cm  Ankle Left: Right: Point of Measurement: From Medial Instep 30 cm 29 cm Vascular Assessment Pulses: Dorsalis Pedis Palpable: [Left:Yes] [Right:Yes] Extremity colors, hair growth, and conditions: Extremity Color: [Left:Hyperpigmented] [Right:Hyperpigmented] Hair Growth on Extremity: [Left:No] [Right:No] Temperature of Extremity: [Left:Warm] [Right:Warm] Capillary Refill: [Left:< 3 seconds] [Right:< 3 seconds] Dependent Rubor: [Left:No No] [Right:No No] Electronic Signature(s) Signed: 05/07/2023 4:18:27 PM By: Zenaida Deed RN, BSN Entered By: Zenaida Deed on 05/07/2023 15:00:18 -------------------------------------------------------------------------------- Multi Wound Chart Details Patient Name: Date of Service: [name], [name] 05/07/2023 2:45 PM Medical Record Number: [id number] Patient Account Number: [id number] Date of Birth/Sex: Treating RN: [birth year] (82 y.o. [name] Primary Care Stryder Poitra: [name] Other Clinician: Referring  Timiyah Romito: Treating Chastity Noland/Extender: [name] in Treatment: 3 Vital Signs Height(in): Capillary Blood Glucose(mg/dl): 67 Weight(lbs): Pulse(bpm): 77 Body Mass Index(BMI): Blood Pressure(mmHg): 162/72 Temperature(F): 98.5 Respiratory Rate(breaths/min): 20 [10:Photos:] Left, Medial Lower Leg Right, Lateral Lower Leg Left, Anterior Lower Leg Wound Location: Gradually Appeared Gradually Appeared Gradually Appeared Wounding Event: Lymphedema Lymphedema Lymphedema Primary Etiology: Diabetic Wound/Ulcer of the Lower Diabetic Wound/Ulcer of the Lower Diabetic Wound/Ulcer of the Lower Secondary Etiology: Extremity Extremity Extremity Lymphedema, Congestive Heart Lymphedema, Congestive Heart Lymphedema, Congestive Heart Comorbid HistoryKARRIE, BONN ([id number]) [id number] Page 4 of 13 Failure, Hypertension, Peripheral Failure, Hypertension, Peripheral Failure, Hypertension, Peripheral Venous Disease, Type II Diabetes, Venous Disease, Type II Diabetes, Venous Disease, Type II Diabetes, Osteoarthritis, Neuropathy, Osteoarthritis, Neuropathy, Osteoarthritis, Neuropathy, Confinement Anxiety Confinement Anxiety Confinement Anxiety 03/25/2023 03/25/2023 05/07/2023 Date Acquired: 3 3 0 Weeks of Treatment: Open Open Open Wound Status: No No No Wound Recurrence: 3.3x1.8x0.1 2.5x4x0.1 3.3x4x0.1 Measurements L x W x D (cm) 4.665 7.854 10.367 A (cm) : rea 0.467 0.785 1.037 Volume (cm) : -6470.40% -141.50% N/A % Reduction in Area: -6571.40% -141.50% N/A % Reduction in Volume: Full Thickness Without Exposed Full Thickness Without Exposed Full Thickness Without Exposed Classification: Support Structures Support Structures Support Structures Medium Medium Large Exudate Amount: Serous Serous Serous Exudate Type: Psychologist, forensic Exudate Color: Flat and Intact Flat and Intact Indistinct, nonvisible Wound Margin: Large (67-100%)  Large (67-100%) Large (67-100%) Granulation Amount: Red Red Red Granulation Quality: None Present (0%) None Present (0%) None Present (0%) Necrotic Amount: Fat Layer (Subcutaneous Tissue): Yes Fat Layer (Subcutaneous Tissue): Yes Fat Layer (Subcutaneous Tissue): Yes Exposed Structures: Fascia: No Fascia: No Fascia: No Tendon: No Tendon: No Tendon: No Muscle: No Muscle: No Muscle: No Joint: No Joint: No Joint: No Bone: No Bone: No Bone: No Small (1-33%) Medium (34-66%) Small (1-33%) Epithelialization: No Abnormalities Noted No Abnormalities Noted Excoriation: Yes Periwound Skin Texture: No Abnormalities Noted No Abnormalities Noted No Abnormalities Noted Periwound Skin Moisture: Hemosiderin Staining: Yes Hemosiderin Staining: Yes Hemosiderin Staining: Yes Periwound Skin Color: No Abnormality No Abnormality No Abnormality Temperature: Compression Therapy Compression Therapy N/A Procedures Performed: Treatment Notes Wound #10 (Lower Leg) Wound Laterality: Left, Medial Cleanser Peri-Wound Care Sween Lotion (Moisturizing lotion) Discharge Instruction: Apply moisturizing lotion as directed Topical Primary Dressing Maxorb Extra Ag+ Alginate Dressing, 4x4.75 (in/in) Discharge Instruction: Apply to wound bed as instructed Secondary Dressing ABD Pad, 8x10 Discharge Instruction: Apply over primary dressing as directed. Secured With Compression Wrap Urgo K2 Lite, (equivalent to a 3 layer) two layer compression system, regular Discharge Instruction: Apply Urgo K2 Lite as directed (alternative to 3 layer compression). Compression Stockings Add-Ons Wound #11 (Lower Leg) Wound Laterality: Right, Lateral Cleanser Peri-Wound Care Sween Lotion (Moisturizing lotion) Discharge Instruction: Apply moisturizing lotion as directed Topical Primary Dressing Maxorb Extra Ag+ Alginate  Dressing, 4x4.75 (in/in) Discharge Instruction: Apply to wound bed as instructed Secondary  Dressing ABD Pad, 8x10 Landstrom, Nigel ([id number]) [id number] Page 5 of 13 Discharge Instruction: Apply over primary dressing as directed. Secured With Compression Wrap Urgo K2 Lite, (equivalent to a 3 layer) two layer compression system, regular Discharge Instruction: Apply Urgo K2 Lite as directed (alternative to 3 layer compression). Compression Stockings Add-Ons Wound #12 (Lower Leg) Wound Laterality: Left, Anterior Cleanser Peri-Wound Care Sween Lotion (Moisturizing lotion) Discharge Instruction: Apply moisturizing lotion as directed Topical Primary Dressing Maxorb Extra Ag+ Alginate Dressing, 4x4.75 (in/in) Discharge Instruction: Apply to wound bed as instructed Secondary Dressing ABD Pad, 8x10 Discharge Instruction: Apply over primary dressing as directed. Zetuvit Plus 4x8 in Discharge Instruction: Apply over primary dressing as directed. Secured With Compression Wrap Urgo K2 Lite, (equivalent to a 3 layer) two layer compression system, regular Discharge Instruction: Apply Urgo K2 Lite as directed (alternative to 3 layer compression). Compression Stockings Add-Ons Electronic Signature(s) Signed: 05/07/2023 3:57:12 PM By: Duanne Guess MD FACS Signed: 05/07/2023 4:18:27 PM By: Zenaida Deed RN, BSN Entered By: Duanne Guess on 05/07/2023 15:57:12 -------------------------------------------------------------------------------- Multi-Disciplinary Care Plan Details Patient Name: Date of Service: [name], [name] 05/07/2023 2:45 PM Medical Record Number: [id number] Patient Account Number: [id number] Date of Birth/Sex: Treating RN: 07/01/1941 (82 y.o. [name] Primary Care Lilienne Weins: [name] Other Clinician: Referring Renell Allum: Treating Daune Divirgilio/Extender: [name] in Treatment: 3 Multidisciplinary Care Plan reviewed with physician Active Inactive Abuse / Safety / Falls / Self Care  Management Nursing Diagnoses: Impaired physical mobility Potential for falls Remington, Fadia ([id number]) [id number] Page 6 of 13 Goals: Patient/caregiver will verbalize/demonstrate measures taken to prevent injury and/or falls Date Initiated: 04/15/2023 Target Resolution Date: 05/13/2023 Goal Status: Active Interventions: Assess fall risk on admission and as needed Assess impairment of mobility on admission and as needed per policy Notes: Nutrition Nursing Diagnoses: Impaired glucose control: actual or potential Potential for alteratiion in Nutrition/Potential for imbalanced nutrition Goals: Patient/caregiver will maintain therapeutic glucose control Date Initiated: 04/15/2023 Target Resolution Date: 05/13/2023 Goal Status: Active Interventions: Assess HgA1c results as ordered upon admission and as needed Assess patient nutrition upon admission and as needed per policy Provide education on elevated blood sugars and impact on wound healing Treatment Activities: Patient referred to Primary Care Physician for further nutritional evaluation : 04/15/2023 Notes: Venous Leg Ulcer Nursing Diagnoses: Potential for venous Insuffiency (use before diagnosis confirmed) Goals: Patient will maintain optimal edema control Date Initiated: 04/15/2023 Target Resolution Date: 05/13/2023 Goal Status: Active Interventions: Assess peripheral edema status every visit. Compression as ordered Treatment Activities: Therapeutic compression applied : 04/15/2023 Notes: Wound/Skin Impairment Nursing Diagnoses: Impaired tissue integrity Knowledge deficit related to ulceration/compromised skin integrity Goals: Patient/caregiver will verbalize understanding of skin care regimen Date Initiated: 04/15/2023 Target Resolution Date: 05/13/2023 Goal Status: Active Ulcer/skin breakdown will have a volume reduction of 30% by week 4 Date Initiated: 04/15/2023 Target Resolution Date: 05/13/2023 Goal  Status: Active Interventions: Assess patient/caregiver ability to obtain necessary supplies Assess patient/caregiver ability to perform ulcer/skin care regimen upon admission and as needed Assess ulceration(s) every visit Provide education on ulcer and skin care Treatment Activities: Skin care regimen initiated : 04/15/2023 Topical wound management initiated : 04/15/2023 Notes: JENI, TURCIOS ([id number]) [phone].pdf Page 7 of 13 Electronic Signature(s) Signed: 05/07/2023 4:18:27 PM By: Zenaida Deed RN, BSN Entered By: Zenaida Deed on 05/07/2023 15:14:43 -------------------------------------------------------------------------------- Pain Assessment Details Patient Name: Date of Service: [name], [name] 05/07/2023 2:45 PM Medical Record Number: [id number] Patient Account  Number: [id number] Date of Birth/Sex: Treating RN: [birth year] (82 y.o. [name] Primary Care Diasia Henken: [name] Other Clinician: Referring Gerline Ratto: Treating Lariah Fleer/Extender: [name] in Treatment: 3 Active Problems Location of Pain Severity and Description of Pain Patient Has Paino Yes Site Locations Pain Location: Generalized Pain With Dressing Change: No Duration of the Pain. Constant / Intermittento Intermittent Rate the pain. Current Pain Level: 0 Worst Pain Level: 8 Least Pain Level: 0 Character of Pain Describe the Pain: Cramping Pain Management and Medication Current Pain Management: Is the Current Pain Management Adequate: Adequate How does your wound impact your activities of daily livingo Sleep: No Bathing: No Appetite: No Relationship With Others: No Bladder Continence: No Emotions: No Bowel Continence: No Work: No Toileting: No Drive: No Dressing: No Hobbies: No Electronic Signature(s) Signed: 05/07/2023 4:18:27 PM By: Zenaida Deed RN, BSN Entered By: Zenaida Deed on 05/07/2023  14:54:43 -------------------------------------------------------------------------------- Patient/Caregiver Education Details Patient Name: Date of Service: [name] 8/27/2024andnbsp2:45 PM [place], [name] ([id number]) [id number] Page 8 of 13 Medical Record Number: [id number] Patient Account Number: [id number] Date of Birth/Gender: Treating RN: 1941/04/06 (82 y.o. [name] Primary Care Physician: [name] Other Clinician: Referring Physician: Treating Physician/Extender: [name] in Treatment: 3 Education Assessment Education Provided To: Patient Education Topics Provided Venous: Methods: Explain/Verbal Responses: Reinforcements needed, State content correctly Wound/Skin Impairment: Methods: Explain/Verbal Responses: Reinforcements needed, State content correctly Electronic Signature(s) Signed: 05/07/2023 4:18:27 PM By: Zenaida Deed RN, BSN Entered By: Zenaida Deed on 05/07/2023 15:15:15 -------------------------------------------------------------------------------- Wound Assessment Details Patient Name: Date of Service: [name], [name] 05/07/2023 2:45 PM Medical Record Number: [id number] Patient Account Number: [id number] Date of Birth/Sex: Treating RN: Nov 28, 1940 (82 y.o. [name], Linda Primary Care Shaletha Humble: [name] Other Clinician: Referring Sakiya Stepka: Treating Andie Mungin/Extender: [name] in Treatment: 3 Wound Status Wound Number: 10 Primary Lymphedema Etiology: Wound Location: Left, Medial Lower Leg Secondary Diabetic Wound/Ulcer of the Lower Extremity Wounding Event: Gradually Appeared Etiology: Date Acquired: 03/25/2023 Wound Open Weeks Of Treatment: 3 Status: Clustered Wound: No Comorbid Lymphedema, Congestive Heart Failure, Hypertension, Peripheral History: Venous Disease, Type II Diabetes, Osteoarthritis, Neuropathy, Confinement  Anxiety Photos Wound Measurements Length: (cm) 3.3 Width: (cm) 1.8 Depth: (cm) 0.1 Area: (cm) 4. Volume: (cm) 0. Philbert, Shadaya ([id number]) % Reduction in Area: -6470.4% % Reduction in Volume: -6571.4% Epithelialization: Small (1-33%) 665 Tunneling: No 467 Undermining: No [id number] Page 9 of 13 Wound Description Classification: Full Thickness Without Exposed Suppor Wound Margin: Flat and Intact Exudate Amount: Medium Exudate Type: Serous Exudate Color: amber t Structures Foul Odor After Cleansing: No Slough/Fibrino No Wound Bed Granulation Amount: Large (67-100%) Exposed Structure Granulation Quality: Red Fascia Exposed: No Necrotic Amount: None Present (0%) Fat Layer (Subcutaneous Tissue) Exposed: Yes Tendon Exposed: No Muscle Exposed: No Joint Exposed: No Bone Exposed: No Periwound Skin Texture Texture Color No Abnormalities Noted: Yes No Abnormalities Noted: No Hemosiderin Staining: Yes Moisture No Abnormalities Noted: Yes Temperature / Pain Temperature: No Abnormality Treatment Notes Wound #10 (Lower Leg) Wound Laterality: Left, Medial Cleanser Peri-Wound Care Sween Lotion (Moisturizing lotion) Discharge Instruction: Apply moisturizing lotion as directed Topical Primary Dressing Maxorb Extra Ag+ Alginate Dressing, 4x4.75 (in/in) Discharge Instruction: Apply to wound bed as instructed Secondary Dressing ABD Pad, 8x10 Discharge Instruction: Apply over primary dressing as directed. Secured With Compression Wrap Urgo K2 Lite, (equivalent to a 3 layer) two layer compression system, regular Discharge Instruction: Apply Urgo K2 Lite as directed (alternative to 3 layer compression). Compression Stockings  Add-Ons Electronic Signature(s) Signed: 05/07/2023 4:18:27 PM By: Zenaida Deed RN, BSN Entered By: Zenaida Deed on 05/07/2023 15:09:00 -------------------------------------------------------------------------------- Wound  Assessment Details Patient Name: Date of Service: [name], [name] 05/07/2023 2:45 PM Medical Record Number: [id number] Patient Account Number: [id number] Date of Birth/Sex: Treating RN: 18-Aug-1941 (82 y.o. [name] Primary Care Lavonne Kinderman: [name] Other Clinician: Referring Berkleigh Beckles: Treating Kamari Bilek/Extender: [name] in Treatment: 3 [place], [name] ([id number]) [id number] Page 10 of 13 Wound Status Wound Number: 11 Primary Lymphedema Etiology: Wound Location: Right, Lateral Lower Leg Secondary Diabetic Wound/Ulcer of the Lower Extremity Wounding Event: Gradually Appeared Etiology: Date Acquired: 03/25/2023 Wound Open Weeks Of Treatment: 3 Status: Clustered Wound: No Comorbid Lymphedema, Congestive Heart Failure, Hypertension, Peripheral History: Venous Disease, Type II Diabetes, Osteoarthritis, Neuropathy, Confinement Anxiety Photos Wound Measurements Length: (cm) 2.5 Width: (cm) 4 Depth: (cm) 0.1 Area: (cm) 7.854 Volume: (cm) 0.785 % Reduction in Area: -141.5% % Reduction in Volume: -141.5% Epithelialization: Medium (34-66%) Tunneling: No Undermining: No Wound Description Classification: Full Thickness Without Exposed Suppor Wound Margin: Flat and Intact Exudate Amount: Medium Exudate Type: Serous Exudate Color: amber t Structures Foul Odor After Cleansing: No Slough/Fibrino No Wound Bed Granulation Amount: Large (67-100%) Exposed Structure Granulation Quality: Red Fascia Exposed: No Necrotic Amount: None Present (0%) Fat Layer (Subcutaneous Tissue) Exposed: Yes Tendon Exposed: No Muscle Exposed: No Joint Exposed: No Bone Exposed: No Periwound Skin Texture Texture Color No Abnormalities Noted: Yes No Abnormalities Noted: No Hemosiderin Staining: Yes Moisture No Abnormalities Noted: Yes Temperature / Pain Temperature: No Abnormality Treatment Notes Wound #11 (Lower Leg) Wound  Laterality: Right, Lateral Cleanser Peri-Wound Care Sween Lotion (Moisturizing lotion) Discharge Instruction: Apply moisturizing lotion as directed Topical Primary Dressing Maxorb Extra Ag+ Alginate Dressing, 4x4.75 (in/in) Discharge Instruction: Apply to wound bed as instructed Secondary Dressing ABD Pad, 8x10 Discharge Instruction: Apply over primary dressing as directed. SHAWNAY, EAKEN ([id number]) [id number] Page 11 of 13 Secured With Compression Wrap Urgo K2 Lite, (equivalent to a 3 layer) two layer compression system, regular Discharge Instruction: Apply Urgo K2 Lite as directed (alternative to 3 layer compression). Compression Stockings Add-Ons Electronic Signature(s) Signed: 05/07/2023 4:18:27 PM By: Zenaida Deed RN, BSN Entered By: Zenaida Deed on 05/07/2023 15:09:59 -------------------------------------------------------------------------------- Wound Assessment Details Patient Name: Date of Service: [name], [name] 05/07/2023 2:45 PM Medical Record Number: [id number] Patient Account Number: [id number] Date of Birth/Sex: Treating RN: [birth year] (82 y.o. [name], [name] Primary Care Nomar Broad: [name] Other Clinician: Referring Felicha Frayne: Treating Meylin Stenzel/Extender: [name] in Treatment: 3 Wound Status Wound Number: 12 Primary Lymphedema Etiology: Wound Location: Left, Anterior Lower Leg Secondary Diabetic Wound/Ulcer of the Lower Extremity Wounding Event: Gradually Appeared Etiology: Date Acquired: 05/07/2023 Wound Open Weeks Of Treatment: 0 Status: Clustered Wound: No Comorbid Lymphedema, Congestive Heart Failure, Hypertension, Peripheral History: Venous Disease, Type II Diabetes, Osteoarthritis, Neuropathy, Confinement Anxiety Photos Wound Measurements Length: (cm) 3.3 Width: (cm) 4 Depth: (cm) 0.1 Area: (cm) 10.367 Volume: (cm) 1.037 % Reduction in Area: % Reduction in  Volume: Epithelialization: Small (1-33%) Tunneling: No Undermining: No Wound Description Classification: Full Thickness Without Exposed Suppor Wound Margin: Indistinct, nonvisible Exudate Amount: Large Exudate Type: Serous Exudate Color: amber t Structures Foul Odor After Cleansing: No Slough/Fibrino No Wound Bed Granulation Amount: Large (67-100%) Exposed Structure Granulation Quality: Red Fascia Exposed: No Necrotic Amount: None Present (0%) Fat Layer (Subcutaneous Tissue) Exposed: Yes Tendon Exposed: No Wilmot, Anani ([id number]) [id number] Page 12 of 13 Muscle Exposed: No Joint Exposed: No Bone Exposed: No  Periwound Skin Texture Texture Color No Abnormalities Noted: No No Abnormalities Noted: No Excoriation: Yes Hemosiderin Staining: Yes Moisture Temperature / Pain No Abnormalities Noted: Yes Temperature: No Abnormality Treatment Notes Wound #12 (Lower Leg) Wound Laterality: Left, Anterior Cleanser Peri-Wound Care Sween Lotion (Moisturizing lotion) Discharge Instruction: Apply moisturizing lotion as directed Topical Primary Dressing Maxorb Extra Ag+ Alginate Dressing, 4x4.75 (in/in) Discharge Instruction: Apply to wound bed as instructed Secondary Dressing ABD Pad, 8x10 Discharge Instruction: Apply over primary dressing as directed. Zetuvit Plus 4x8 in Discharge Instruction: Apply over primary dressing as directed. Secured With Compression Wrap Urgo K2 Lite, (equivalent to a 3 layer) two layer compression system, regular Discharge Instruction: Apply Urgo K2 Lite as directed (alternative to 3 layer compression). Compression Stockings Add-Ons Electronic Signature(s) Signed: 05/07/2023 4:18:27 PM By: Zenaida Deed RN, BSN Entered By: Zenaida Deed on 05/07/2023 15:12:41 -------------------------------------------------------------------------------- Vitals Details Patient Name: Date of Service: [name], ROGOWSKI 05/07/2023 2:45  PM Medical Record Number: [id number] Patient Account Number: [id number] Date of Birth/Sex: Treating RN: 1941-07-12 (82 y.o. [name], [name] Primary Care Chistine Dematteo: [name] Other Clinician: Referring Deshane Cotroneo: Treating Cinde Ebert/Extender: [name] in Treatment: 3 Vital Signs Time Taken: 14:51 Temperature (F): 98.5 Pulse (bpm): 77 Respiratory Rate (breaths/min): 20 Blood Pressure (mmHg): 162/72 Capillary Blood Glucose (mg/dl): 67 Reference Range: 80 - 120 mg / dl Notes Hall, Monay ([id number]) [id number] Page 13 of 13 glucose per pt report this am Electronic Signature(s) Signed: 05/07/2023 4:18:27 PM By: Zenaida Deed RN, BSN Entered By: Zenaida Deed on 05/07/2023 14:56:10

## 2023-05-09 NOTE — Progress Notes (Signed)
Remote pacemaker transmission.   

## 2023-05-16 ENCOUNTER — Encounter (HOSPITAL_BASED_OUTPATIENT_CLINIC_OR_DEPARTMENT_OTHER): Payer: Medicare Other | Attending: General Surgery | Admitting: General Surgery

## 2023-05-16 DIAGNOSIS — L97821 Non-pressure chronic ulcer of other part of left lower leg limited to breakdown of skin: Secondary | ICD-10-CM | POA: Insufficient documentation

## 2023-05-16 DIAGNOSIS — L84 Corns and callosities: Secondary | ICD-10-CM | POA: Diagnosis not present

## 2023-05-16 DIAGNOSIS — E1151 Type 2 diabetes mellitus with diabetic peripheral angiopathy without gangrene: Secondary | ICD-10-CM | POA: Diagnosis not present

## 2023-05-16 DIAGNOSIS — N184 Chronic kidney disease, stage 4 (severe): Secondary | ICD-10-CM | POA: Insufficient documentation

## 2023-05-16 DIAGNOSIS — I272 Pulmonary hypertension, unspecified: Secondary | ICD-10-CM | POA: Insufficient documentation

## 2023-05-16 DIAGNOSIS — E1122 Type 2 diabetes mellitus with diabetic chronic kidney disease: Secondary | ICD-10-CM | POA: Diagnosis not present

## 2023-05-16 DIAGNOSIS — I130 Hypertensive heart and chronic kidney disease with heart failure and stage 1 through stage 4 chronic kidney disease, or unspecified chronic kidney disease: Secondary | ICD-10-CM | POA: Diagnosis not present

## 2023-05-16 DIAGNOSIS — I872 Venous insufficiency (chronic) (peripheral): Secondary | ICD-10-CM | POA: Diagnosis not present

## 2023-05-16 DIAGNOSIS — L97811 Non-pressure chronic ulcer of other part of right lower leg limited to breakdown of skin: Secondary | ICD-10-CM | POA: Diagnosis not present

## 2023-05-16 DIAGNOSIS — I890 Lymphedema, not elsewhere classified: Secondary | ICD-10-CM | POA: Insufficient documentation

## 2023-05-16 DIAGNOSIS — I5032 Chronic diastolic (congestive) heart failure: Secondary | ICD-10-CM | POA: Insufficient documentation

## 2023-05-16 DIAGNOSIS — Z833 Family history of diabetes mellitus: Secondary | ICD-10-CM | POA: Diagnosis not present

## 2023-05-16 DIAGNOSIS — E1142 Type 2 diabetes mellitus with diabetic polyneuropathy: Secondary | ICD-10-CM | POA: Diagnosis not present

## 2023-05-16 DIAGNOSIS — G4733 Obstructive sleep apnea (adult) (pediatric): Secondary | ICD-10-CM | POA: Insufficient documentation

## 2023-05-16 DIAGNOSIS — L97812 Non-pressure chronic ulcer of other part of right lower leg with fat layer exposed: Secondary | ICD-10-CM | POA: Diagnosis not present

## 2023-05-16 DIAGNOSIS — E11622 Type 2 diabetes mellitus with other skin ulcer: Secondary | ICD-10-CM | POA: Diagnosis not present

## 2023-05-16 NOTE — Progress Notes (Signed)
[name], [name] ([id number]) [id number] Page 1 of 11 Visit Report for 05/16/2023 Arrival Information Details Patient Name: Date of Service: [name], [name] 05/16/2023 10:15 A M Medical Record Number: [id number] Patient Account Number: [id number] Date of Birth/Sex: Treating RN: September 27, 1940 (82 y.o. Billy [place], Linda Primary Care Cianni Manny: Aliene Beams Other Clinician: Referring Burnice Oestreicher: Treating Nashly Olsson/Extender: Karna Dupes in Treatment: 4 Visit Information History Since Last Visit Added or deleted any medications: No Patient Arrived: [name] Any new allergies or adverse reactions: No Arrival Time: 10:20 Had a fall or experienced change in No Accompanied By: self activities of daily living that may affect Transfer Assistance: None risk of falls: Patient Identification Verified: Yes Signs or symptoms of abuse/neglect since last visito No Secondary Verification Process Completed: Yes Hospitalized since last visit: No Patient Requires Transmission-Based Precautions: No Implantable device outside of the clinic excluding No Patient Has Alerts: No cellular tissue based products placed in the center since last visit: Has Dressing in Place as Prescribed: Yes Has Compression in Place as Prescribed: Yes Pain Present Now: No Electronic Signature(s) Signed: 05/16/2023 4:53:04 PM By: Zenaida Deed RN, BSN Entered By: Zenaida Deed on 05/16/2023 07:24:02 -------------------------------------------------------------------------------- Compression Therapy Details Patient Name: Date of Service: [name], [name] 05/16/2023 10:15 A M Medical Record Number: [id number] Patient Account Number: [id number] Date of Birth/Sex: Treating RN: 1941-03-04 (82 y.o. [name] Primary Care Tykwon Fera: Aliene Beams Other Clinician: Referring Jacody Beneke: Treating Sherwin Hollingshed/Extender: Karna Dupes in Treatment: 4 Compression Therapy  Performed for Wound Assessment: Wound #10 Left,Medial Lower Leg Performed By: Clinician Zenaida Deed, RN Compression Type: Double Layer Post Procedure Diagnosis Same as Pre-procedure Notes urgo lite Electronic Signature(s) Signed: 05/16/2023 4:53:04 PM By: Zenaida Deed RN, BSN Entered By: Zenaida Deed on 05/16/2023 07:48:22 [name] ([id number]) [id number] Page 2 of 11 -------------------------------------------------------------------------------- Compression Therapy Details Patient Name: Date of Service: [name], [name] 05/16/2023 10:15 A M Medical Record Number: [id number] Patient Account Number: [id number] Date of Birth/Sex: Treating RN: [birth year] (82 y.o. [name] Primary Care Gershom Brobeck: Aliene Beams Other Clinician: Referring Jasalyn Frysinger: Treating Ahava Kissoon/Extender: Karna Dupes in Treatment: 4 Compression Therapy Performed for Wound Assessment: Wound #11 Right,Lateral Lower Leg Performed By: Clinician Zenaida Deed, RN Compression Type: Double Layer Post Procedure Diagnosis Same as Pre-procedure Notes urgo lite Electronic Signature(s) Signed: 05/16/2023 4:53:04 PM By: Zenaida Deed RN, BSN Entered By: Zenaida Deed on 05/16/2023 07:48:22 -------------------------------------------------------------------------------- Encounter Discharge Information Details Patient Name: Date of Service: [name], [name] 05/16/2023 10:15 A M Medical Record Number: [id number] Patient Account Number: [id number] Date of Birth/Sex: Treating RN: Apr 12, 1941 (82 y.o. [name] Primary Care Milus Fritze: Aliene Beams Other Clinician: Referring Suleima Ohlendorf: Treating Trinity Haun/Extender: Karna Dupes in Treatment: 4 Encounter Discharge Information Items Discharge Condition: Stable Ambulatory Status: Cane Discharge Destination: Home Transportation: Private Auto Accompanied By: self Schedule  Follow-up Appointment: Yes Clinical Summary of Care: Patient Declined Electronic Signature(s) Signed: 05/16/2023 4:53:04 PM By: Zenaida Deed RN, BSN Entered By: Zenaida Deed on 05/16/2023 08:11:19 -------------------------------------------------------------------------------- Lower Extremity Assessment Details Patient Name: Date of Service: [name], [name] 05/16/2023 10:15 A M Medical Record Number: [id number] Patient Account Number: [id number] Date of Birth/Sex: Treating RN: April 12, 1941 (82 y.o. [name] Primary Care Treylan Mcclintock: Aliene Beams Other Clinician: Referring Cindie Rajagopalan: Treating Adolpho Meenach/Extender: Gracy Bruins [place], Bosie Clos ([id number]) [id number] Page 3 of 11 Weeks in Treatment: 4 Edema Assessment Assessed: [Left: No] [Right: No] Edema: [Left: Yes] [Right: Yes] Calf Left: Right: Point of Measurement: From Medial  Instep 42 cm 42.5 cm Ankle Left: Right: Point of Measurement: From Medial Instep 28.5 cm 29.5 cm Vascular Assessment Pulses: Dorsalis Pedis Palpable: [Left:Yes] [Right:Yes] Extremity colors, hair growth, and conditions: Extremity Color: [Left:Hyperpigmented] [Right:Hyperpigmented] Hair Growth on Extremity: [Left:No] [Right:No] Temperature of Extremity: [Left:Warm] [Right:Warm] Capillary Refill: [Left:< 3 seconds] [Right:< 3 seconds] Dependent Rubor: [Left:No No] [Right:No No] Electronic Signature(s) Signed: 05/16/2023 4:53:04 PM By: Zenaida Deed RN, BSN Entered By: Zenaida Deed on 05/16/2023 07:27:24 -------------------------------------------------------------------------------- Multi Wound Chart Details Patient Name: Date of Service: [name], [name] 05/16/2023 10:15 A M Medical Record Number: [id number] Patient Account Number: [id number] Date of Birth/Sex: Treating RN: 26-Jun-1941 (82 y.o. F) Primary Care Jamicia Haaland: Aliene Beams Other Clinician: Referring Thurma Priego: Treating [name]/Extender:  Karna Dupes in Treatment: 4 Vital Signs Height(in): 63 Capillary Blood Glucose(mg/dl): 563 Weight(lbs): 875 Pulse(bpm): 73 Body Mass Index(BMI): 37 Blood Pressure(mmHg): 137/74 Temperature(F): 98.5 Respiratory Rate(breaths/min): 22 [10:Photos:] Left, Medial Lower Leg Right, Lateral Lower Leg Left, Anterior Lower Leg Wound Location: Gradually Appeared Gradually Appeared Gradually Appeared Wounding Event: Lymphedema Lymphedema Lymphedema Primary Etiology: Diabetic Wound/Ulcer of the Lower Diabetic Wound/Ulcer of the Lower Diabetic Wound/Ulcer of the Lower Secondary Etiology: Extremity Extremity Extremity Lymphedema, Congestive Heart Lymphedema, Congestive Heart Lymphedema, Congestive Heart Comorbid HistorySHAWNDALE, COLLINGSWORTH ([id number]) [id number] Page 4 of 11 Failure, Hypertension, Peripheral Failure, Hypertension, Peripheral Failure, Hypertension, Peripheral Venous Disease, Type II Diabetes, Venous Disease, Type II Diabetes, Venous Disease, Type II Diabetes, Osteoarthritis, Neuropathy, Osteoarthritis, Neuropathy, Osteoarthritis, Neuropathy, Confinement Anxiety Confinement Anxiety Confinement Anxiety 03/25/2023 03/25/2023 05/07/2023 Date Acquired: 4 4 1  Weeks of Treatment: Open Open Open Wound Status: No No No Wound Recurrence: 3.8x2.5x0.1 3.5x4x0.1 5.3x6.5x0.1 Measurements L x W x D (cm) 7.461 10.996 27.057 A (cm) : rea 0.746 1.1 2.706 Volume (cm) : -10408.50% -238.10% -161.00% % Reduction in Area: -10557.10% -238.50% -160.90% % Reduction in Volume: Full Thickness Without Exposed Full Thickness Without Exposed Full Thickness Without Exposed Classification: Support Structures Support Structures Support Structures Medium Medium Large Exudate Amount: Serous Serous Serous Exudate Type: Psychologist, forensic Exudate Color: Flat and Intact Flat and Intact Indistinct, nonvisible Wound Margin: Large (67-100%) Large  (67-100%) Large (67-100%) Granulation Amount: Red Red Red Granulation Quality: None Present (0%) None Present (0%) None Present (0%) Necrotic Amount: Fat Layer (Subcutaneous Tissue): Yes Fat Layer (Subcutaneous Tissue): Yes Fat Layer (Subcutaneous Tissue): Yes Exposed Structures: Fascia: No Fascia: No Fascia: No Tendon: No Tendon: No Tendon: No Muscle: No Muscle: No Muscle: No Joint: No Joint: No Joint: No Bone: No Bone: No Bone: No Small (1-33%) Small (1-33%) Small (1-33%) Epithelialization: No Abnormalities Noted No Abnormalities Noted Excoriation: Yes Periwound Skin Texture: No Abnormalities Noted No Abnormalities Noted No Abnormalities Noted Periwound Skin Moisture: Hemosiderin Staining: Yes Hemosiderin Staining: Yes Hemosiderin Staining: Yes Periwound Skin Color: No Abnormality No Abnormality No Abnormality Temperature: Compression Therapy Chemical Cauterization N/A Procedures Performed: Compression Therapy Treatment Notes Electronic Signature(s) Signed: 05/16/2023 11:01:22 AM By: Duanne Guess MD FACS Previous Signature: 05/16/2023 10:44:49 AM Version By: Duanne Guess MD FACS Entered By: Duanne Guess on 05/16/2023 08:01:22 -------------------------------------------------------------------------------- Multi-Disciplinary Care Plan Details Patient Name: Date of Service: JAMILYNN, [name] 05/16/2023 10:15 A M Medical Record Number: [id number] Patient Account Number: [id number] Date of Birth/Sex: Treating RN: 1940/10/23 (82 y.o. [name] Primary Care Seraphine Gudiel: Aliene Beams Other Clinician: Referring Maxi Rodas: Treating Daiveon Markman/Extender: Karna Dupes in Treatment: 4 Multidisciplinary Care Plan reviewed with physician Active Inactive Abuse / Safety / Falls / Self Care Management Nursing Diagnoses: Impaired physical mobility  Potential for falls Goals: Patient/caregiver will verbalize/demonstrate measures taken to  prevent injury and/or falls Date Initiated: 04/15/2023 Target Resolution Date: 06/17/2023 Goal Status: Active Interventions: Assess fall risk on admission and as needed Assess impairment of mobility on admission and as needed per policy Eva, Bosie Clos ([id number]) [id number] Page 5 of 11 Notes: Nutrition Nursing Diagnoses: Impaired glucose control: actual or potential Potential for alteratiion in Nutrition/Potential for imbalanced nutrition Goals: Patient/caregiver will maintain therapeutic glucose control Date Initiated: 04/15/2023 Target Resolution Date: 06/17/2023 Goal Status: Active Interventions: Assess HgA1c results as ordered upon admission and as needed Assess patient nutrition upon admission and as needed per policy Provide education on elevated blood sugars and impact on wound healing Treatment Activities: Patient referred to Primary Care Physician for further nutritional evaluation : 04/15/2023 Notes: Venous Leg Ulcer Nursing Diagnoses: Potential for venous Insuffiency (use before diagnosis confirmed) Goals: Patient will maintain optimal edema control Date Initiated: 04/15/2023 Target Resolution Date: 06/17/2023 Goal Status: Active Interventions: Assess peripheral edema status every visit. Compression as ordered Treatment Activities: Therapeutic compression applied : 04/15/2023 Notes: Wound/Skin Impairment Nursing Diagnoses: Impaired tissue integrity Knowledge deficit related to ulceration/compromised skin integrity Goals: Patient/caregiver will verbalize understanding of skin care regimen Date Initiated: 04/15/2023 Target Resolution Date: 06/17/2023 Goal Status: Active Ulcer/skin breakdown will have a volume reduction of 30% by week 4 Date Initiated: 04/15/2023 Date Inactivated: 05/16/2023 Target Resolution Date: 05/13/2023 Unmet Reason: uncontrolled edema, Goal Status: Unmet fluid overload Ulcer/skin breakdown will have a volume reduction of 50%  by week 8 Date Initiated: 05/16/2023 Target Resolution Date: 06/13/2023 Goal Status: Active Interventions: Assess patient/caregiver ability to obtain necessary supplies Assess patient/caregiver ability to perform ulcer/skin care regimen upon admission and as needed Assess ulceration(s) every visit Provide education on ulcer and skin care Treatment Activities: Skin care regimen initiated : 04/15/2023 Topical wound management initiated : 04/15/2023 Notes: Electronic Signature(s) Signed: 05/16/2023 4:53:04 PM By: Zenaida Deed RN, BSN Entered By: Zenaida Deed on 05/16/2023 07:40:12 [name] ([id number]) [id number] Page 6 of 11 -------------------------------------------------------------------------------- Pain Assessment Details Patient Name: Date of Service: [name], [name] 05/16/2023 10:15 A M Medical Record Number: [id number] Patient Account Number: [id number] Date of Birth/Sex: Treating RN: 06/12/1941 (82 y.o. [name] Primary Care Denelda Akerley: Aliene Beams Other Clinician: Referring Jazell Rosenau: Treating Sharica Roedel/Extender: Karna Dupes in Treatment: 4 Active Problems Location of Pain Severity and Description of Pain Patient Has Paino No Site Locations Rate the pain. Current Pain Level: 0 Pain Management and Medication Current Pain Management: Electronic Signature(s) Signed: 05/16/2023 4:53:04 PM By: Zenaida Deed RN, BSN Entered By: Zenaida Deed on 05/16/2023 07:25:34 -------------------------------------------------------------------------------- Patient/Caregiver Education Details Patient Name: Date of Service: Charline Bills 9/5/2024andnbsp10:15 A M Medical Record Number: [id number] Patient Account Number: [id number] Date of Birth/Gender: Treating RN: [birth year] (82 y.o. [name] Primary Care Physician: Aliene Beams Other Clinician: Referring Physician: Treating Physician/Extender: Karna Dupes in Treatment: 4 Education Assessment Education Provided To: Patient Education Topics Provided Venous: Methods: Explain/Verbal Responses: Reinforcements needed, State content correctly [place], Bosie Clos ([id number]) [id number] Page 7 of 11 Wound/Skin Impairment: Methods: Explain/Verbal Responses: Reinforcements needed, State content correctly Electronic Signature(s) Signed: 05/16/2023 4:53:04 PM By: Zenaida Deed RN, BSN Entered By: Zenaida Deed on 05/16/2023 07:41:02 -------------------------------------------------------------------------------- Wound Assessment Details Patient Name: Date of Service: [name], [name] 05/16/2023 10:15 A M Medical Record Number: [id number] Patient Account Number: [id number] Date of Birth/Sex: Treating RN: Mar 29, 1941 (82 y.o. [name] Primary Care Mysty Kielty: Aliene Beams Other Clinician: Referring Cartina Brousseau: Treating Minoru Chap/Extender: Lady Gary,  Kathee Delton, Fleet Contras Weeks in Treatment: 4 Wound Status Wound Number: 10 Primary Lymphedema Etiology: Wound Location: Left, Medial Lower Leg Secondary Diabetic Wound/Ulcer of the Lower Extremity Wounding Event: Gradually Appeared Etiology: Date Acquired: 03/25/2023 Wound Open Weeks Of Treatment: 4 Status: Clustered Wound: No Comorbid Lymphedema, Congestive Heart Failure, Hypertension, Peripheral History: Venous Disease, Type II Diabetes, Osteoarthritis, Neuropathy, Confinement Anxiety Photos Wound Measurements Length: (cm) 3.8 Width: (cm) 2.5 Depth: (cm) 0.1 Area: (cm) 7.461 Volume: (cm) 0.746 % Reduction in Area: -10408.5% % Reduction in Volume: -10557.1% Epithelialization: Small (1-33%) Tunneling: No Undermining: No Wound Description Classification: Full Thickness Without Exposed Suppor Wound Margin: Flat and Intact Exudate Amount: Medium Exudate Type: Serous Exudate Color: amber t Structures Foul Odor After Cleansing:  No Slough/Fibrino No Wound Bed Granulation Amount: Large (67-100%) Exposed Structure Granulation Quality: Red Fascia Exposed: No Necrotic Amount: None Present (0%) Fat Layer (Subcutaneous Tissue) Exposed: Yes Tendon Exposed: No Muscle Exposed: No Joint Exposed: No Bone Exposed: No [place], [name] ([id number]) [id number] Page 8 of 11 Periwound Skin Texture Texture Color No Abnormalities Noted: Yes No Abnormalities Noted: No Hemosiderin Staining: Yes Moisture No Abnormalities Noted: Yes Temperature / Pain Temperature: No Abnormality Treatment Notes Wound #10 (Lower Leg) Wound Laterality: Left, Medial Cleanser Peri-Wound Care Sween Lotion (Moisturizing lotion) Discharge Instruction: Apply moisturizing lotion as directed Topical Primary Dressing Maxorb Extra Ag+ Alginate Dressing, 4x4.75 (in/in) Discharge Instruction: Apply to wound bed as instructed Secondary Dressing ABD Pad, 8x10 Discharge Instruction: Apply over primary dressing as directed. Secured With Compression Wrap Urgo K2 Lite, (equivalent to a 3 layer) two layer compression system, regular Discharge Instruction: Apply Urgo K2 Lite as directed (alternative to 3 layer compression). Compression Stockings Add-Ons Electronic Signature(s) Signed: 05/16/2023 4:53:04 PM By: Zenaida Deed RN, BSN Entered By: Zenaida Deed on 05/16/2023 07:40:05 -------------------------------------------------------------------------------- Wound Assessment Details Patient Name: Date of Service: [name], [name] 05/16/2023 10:15 A M Medical Record Number: [id number] Patient Account Number: [id number] Date of Birth/Sex: Treating RN: 03/19/1941 (82 y.o. [name] Primary Care Zacchaeus Halm: Aliene Beams Other Clinician: Referring Breigh Annett: Treating Pretty Weltman/Extender: Karna Dupes in Treatment: 4 Wound Status Wound Number: 11 Primary Lymphedema Etiology: Wound Location: Right,  Lateral Lower Leg Secondary Diabetic Wound/Ulcer of the Lower Extremity Wounding Event: Gradually Appeared Etiology: Date Acquired: 03/25/2023 Wound Open Weeks Of Treatment: 4 Status: Clustered Wound: No Comorbid Lymphedema, Congestive Heart Failure, Hypertension, Peripheral History: Venous Disease, Type II Diabetes, Osteoarthritis, Neuropathy, Confinement Anxiety Photos LAMEEKA, [name] ([id number]) [id number] Page 9 of 11 Wound Measurements Length: (cm) 3.5 Width: (cm) 4 Depth: (cm) 0.1 Area: (cm) 10.996 Volume: (cm) 1.1 % Reduction in Area: -238.1% % Reduction in Volume: -238.5% Epithelialization: Small (1-33%) Tunneling: No Undermining: No Wound Description Classification: Full Thickness Without Exposed Support Structures Wound Margin: Flat and Intact Exudate Amount: Medium Exudate Type: Serous Exudate Color: amber Foul Odor After Cleansing: No Slough/Fibrino No Wound Bed Granulation Amount: Large (67-100%) Exposed Structure Granulation Quality: Red Fascia Exposed: No Necrotic Amount: None Present (0%) Fat Layer (Subcutaneous Tissue) Exposed: Yes Tendon Exposed: No Muscle Exposed: No Joint Exposed: No Bone Exposed: No Periwound Skin Texture Texture Color No Abnormalities Noted: Yes No Abnormalities Noted: No Hemosiderin Staining: Yes Moisture No Abnormalities Noted: Yes Temperature / Pain Temperature: No Abnormality Treatment Notes Wound #11 (Lower Leg) Wound Laterality: Right, Lateral Cleanser Peri-Wound Care Sween Lotion (Moisturizing lotion) Discharge Instruction: Apply moisturizing lotion as directed Topical Primary Dressing Maxorb Extra Ag+ Alginate Dressing, 4x4.75 (in/in) Discharge Instruction: Apply to wound bed as instructed Secondary Dressing  ABD Pad, 8x10 Discharge Instruction: Apply over primary dressing as directed. Secured With Compression Wrap Urgo K2 Lite, (equivalent to a 3 layer) two layer compression  system, regular Discharge Instruction: Apply Urgo K2 Lite as directed (alternative to 3 layer compression). Compression Stockings Add-Ons Electronic Signature(s) Signed: 05/16/2023 4:53:04 PM By: Zenaida Deed RN, BSN Meloni, JUDITH9/01/2023 4:53:04 PM By: Zenaida Deed RN, BSN Signed: ([id number]) [id number] Page 10 of 11 Entered By: Zenaida Deed on 05/16/2023 07:40:45 -------------------------------------------------------------------------------- Wound Assessment Details Patient Name: Date of Service: [name], [name] 05/16/2023 10:15 A M Medical Record Number: [id number] Patient Account Number: [id number] Date of Birth/Sex: Treating RN: 10/31/1940 (82 y.o. [name] Primary Care Aryelle Figg: Aliene Beams Other Clinician: Referring Shailee Foots: Treating Lindsee Labarre/Extender: Karna Dupes in Treatment: 4 Wound Status Wound Number: 12 Primary Lymphedema Etiology: Wound Location: Left, Anterior Lower Leg Secondary Diabetic Wound/Ulcer of the Lower Extremity Wounding Event: Gradually Appeared Etiology: Date Acquired: 05/07/2023 Wound Open Weeks Of Treatment: 1 Status: Clustered Wound: No Comorbid Lymphedema, Congestive Heart Failure, Hypertension, Peripheral History: Venous Disease, Type II Diabetes, Osteoarthritis, Neuropathy, Confinement Anxiety Photos Wound Measurements Length: (cm) 5.3 Width: (cm) 6.5 Depth: (cm) 0.1 Area: (cm) 27.057 Volume: (cm) 2.706 % Reduction in Area: -161% % Reduction in Volume: -160.9% Epithelialization: Small (1-33%) Tunneling: No Undermining: No Wound Description Classification: Full Thickness Without Exposed Suppo Wound Margin: Indistinct, nonvisible Exudate Amount: Large Exudate Type: Serous Exudate Color: amber rt Structures Foul Odor After Cleansing: No Slough/Fibrino No Wound Bed Granulation Amount: Large (67-100%) Exposed Structure Granulation Quality: Red Fascia Exposed:  No Necrotic Amount: None Present (0%) Fat Layer (Subcutaneous Tissue) Exposed: Yes Tendon Exposed: No Muscle Exposed: No Joint Exposed: No Bone Exposed: No Periwound Skin Texture Texture Color No Abnormalities Noted: No No Abnormalities Noted: No Excoriation: Yes Hemosiderin Staining: Yes Moisture Temperature / Pain No Abnormalities Noted: Yes Temperature: No Abnormality [place], [name] ([id number]) [id number] Page 11 of 11 Treatment Notes Wound #12 (Lower Leg) Wound Laterality: Left, Anterior Cleanser Peri-Wound Care Zinc Oxide Ointment 30g tube Discharge Instruction: Apply Zinc Oxide to periwound with each dressing change Sween Lotion (Moisturizing lotion) Discharge Instruction: Apply moisturizing lotion as directed Topical Primary Dressing Maxorb Extra Ag+ Alginate Dressing, 4x4.75 (in/in) Discharge Instruction: Apply to wound bed as instructed Secondary Dressing ABD Pad, 8x10 Discharge Instruction: Apply over primary dressing as directed. Zetuvit Plus 4x8 in Discharge Instruction: Apply over primary dressing as directed. Secured With Compression Wrap Urgo K2 Lite, (equivalent to a 3 layer) two layer compression system, regular Discharge Instruction: Apply Urgo K2 Lite as directed (alternative to 3 layer compression). Compression Stockings Add-Ons Electronic Signature(s) Signed: 05/16/2023 4:53:04 PM By: Zenaida Deed RN, BSN Entered By: Zenaida Deed on 05/16/2023 07:41:46 -------------------------------------------------------------------------------- Vitals Details Patient Name: Date of Service: LYNDIN, [name] 05/16/2023 10:15 A M Medical Record Number: [id number] Patient Account Number: [id number] Date of Birth/Sex: Treating RN: [birth year] (82 y.o. [name] Primary Care Crewe Heathman: Aliene Beams Other Clinician: Referring Dontrae Morini: Treating Jazmine Longshore/Extender: Karna Dupes in Treatment: 4 Vital  Signs Time Taken: 10:24 Temperature (F): 98.5 Height (in): 63 Pulse (bpm): 73 Source: Stated Respiratory Rate (breaths/min): 22 Weight (lbs): 209 Blood Pressure (mmHg): 137/74 Source: Stated Capillary Blood Glucose (mg/dl): 703 Body Mass Index (BMI): 37 Reference Range: 80 - 120 mg / dl Notes glucose per pt report this am Electronic Signature(s) Signed: 05/16/2023 4:53:04 PM By: Zenaida Deed RN, BSN Entered By: Zenaida Deed on 05/16/2023 07:25:21

## 2023-05-16 NOTE — Progress Notes (Addendum)
[name] ([id number]) [id number] Page 1 of 13 Visit Report for 05/16/2023 Chief Complaint Document Details Patient Name: Date of Service: [name], [name] 05/16/2023 10:15 A M Medical Record Number: [id number] Patient Account Number: [id number] Date of Birth/Sex: Treating RN: 11/22/1940 (82 y.o. [name] Primary Care Provider: Aliene Beams Other Clinician: Referring Provider: Treating Provider/Extender: Karna Dupes in Treatment: 4 Information Obtained from: Patient Chief Complaint 03/28/2020; patient is here for review of wounds on her bilateral lower legs 12/25/2022; bilateral lower extremity wound 04/15/2023: BLE wounds Electronic Signature(s) Signed: 05/16/2023 11:01:39 AM By: Duanne Guess MD FACS Entered By: Duanne Guess on 05/16/2023 08:01:38 -------------------------------------------------------------------------------- HPI Details Patient Name: Date of Service: [name] 05/16/2023 10:15 A M Medical Record Number: [id number] Patient Account Number: [id number] Date of Birth/Sex: Treating RN: [birth year] (82 y.o. [name] Primary Care Provider: Aliene Beams Other Clinician: Referring Provider: Treating Provider/Extender: Karna Dupes in Treatment: 4 History of Present Illness HPI Description: ADMISSION 03/28/2020 This is a 82 year old woman who is accompanied by her daughter. She is moving to [place] from [place] where she lives. Infectious been going to the wound care center in [place] for the last 2 months. She apparently did not tolerate compression early on in that clinic stay. She is just using dry gauze over the wounds when she came in today. She tells me she has had both arterial and venous reflux studies although she does not have copies of these. She was apparently offered an ablation but I have no further information on that. She also is a type  II diabetic. She was noted to have wounds on her lower extremities during a CHF clinic visit with Dr. Gala Romney and she was referred here. She has several scattered areas on the right medial lower leg and a large area of superficial ulceration on the posterior medial left lower leg. past medical history; chronic kidney disease stage III, gastroesophageal reflux disease, type 2 diabetes with neuropathy, chronic diastolic heart failure, obstructive sleep apnea, hypertension, restless leg syndrome, pulmonary venous hypertension, history of colon CA, pacemaker and apparently venous reflux. We did not do arterial studies in the clinic today we are going to try to get the results that were already done within the last 2 months in [place] 7/26; patient readmitted to the clinic last week with predominantly chronic venous wounds almost circumferentially in the left lower leg and the right leg medially. She also has secondary lymphedema. We put her in compression. She went to the ER in [place] on Friday they remove the wrap on the left leg diagnosed her with cellulitis and put her on doxycycline. They apparently also did a ultrasound that was negative for DVT . We did not get any vascular information from [facility] where she apparently had arterial studies and venous studies. She is currently moving from [place] to [place] is up on her feet quite a bit. She was referred to vein and vascular here but never got to the appointment. ABIs were obtained here at 0.89 on the right and 1.02 on the left 8/9-Patient returns after establishing in clinic last visit, we have been doing 3 layer compression on both sides with calcium alginate, she is following up with the vein and vascular when she gets an appointment. The right side is healed the left side is still got some small open areas She has appt on Wed at Vein and vascular at which point she will come in for Nurse visit here  [name] ([id number])  [id number] Page 2 of 13 8/16; the patient arrives with all of her wounds closed. She has bilateral Juzo stockings. She saw Dr. Darrick Penna on 8/11 to review her reflux studies from the same day. On the right she had no evidence of DVT or SVT She did have deep venous reflux in the common femoral vein as well as superficial venous reflux in the . small saphenous vein of the mid calf on the left again no thrombus in the deep vein or superficial veins. Deep vein reflux in the common femoral vein as well as the great saphenous vein. She saw Dr. Darrick Penna in consult. Also of note he noted that she had a normal arterial scan done in [place] in April 2021, we were never able to obtain this. His feeling was that the patient did have a dilated greater saphenous vein however minimal to no evidence of reflux in her superficial venous system. She also was felt to have mild deep vein reflux and some degree of lymphedema. He recommended continued compression of the lower extremities with intermittent Unna boots if necessary. She was told to elevate her legs. If she develops recurrent ulcerations they were not opposed to revisiting the repeat venous reflux exam to see if she develops worsening reflux for consideration of laser ablation READMISSION 07/26/2020 Patient is now an 82 year old woman who lives in [place]. We had her in the clinic in the summer with wounds on her bilateral lower legs secondary to chronic venous insufficiency with some degree of lymphedema. She was seen by Dr. Darrick Penna in August. This was predominantly to review her venous reflux. She quoted normal arterial studies done in [place] in April 2021 but I have never been able to see these myself.Marland Kitchen His feeling was the patient did have a bilateral great dilated greater saphenous vein however minimal to no evidence of reflux in her superficial venous system. She was also felt to have mild deep vein reflux and some degree of  lymphedema. He recommended compression and leg elevation.Marland Kitchen He was not opposed to repeating her venous reflux studies at some point if she develops worse to see if she develops worsening reflux for consideration of ablation. We discharged the patient with juxta lite stockings bilaterally and she has been compliant with wearing these. Unfortunately in September she had a fall suffering a right tibial fracture requiring an IM nail with a rod. She also had fibular fractures in at least one place. She was sent to skilled facility for rehabilitation she is now back at home. She had a DVT rule out study on 9/24 that was negative for DVT . She tells [place] that in the last 2 or 3 weeks she is developed blisters on her right lower extremity that ruptured into 2 open wounds. She has not been able to get these to close over. She religiously uses her juxta lite stockings on both legs and she gets them on is tight as she can. Originally the left leg was the larger of the 2 legs but since the patient had surgery the right leg is more swollen and tense than the left. She also tells me she had a second fall yesterday and has had pain in the left dorsal midfoot. She has diabetic neuropathy with Charcot foot left greater than right. Past medical history includes chronic kidney disease, hypertension, type 2 diabetes with peripheral neuropathy, congestive heart failure, hypertension, history of colon CA, lumbar stenosis ABIs in our clinic were noncompressible bilaterally 11/30; the wounds  in the right lower extremity are healed anteriorly. She has both Tubigrip and her juxta lite stockings she uses the Tubigrip as the primary layer. We went over this with her today she has the bilateral stockings. She will need to adjust her compression to roughly 30 mmHg 12/25/2022 Mrs. Lumen Mcclard is a 82 year old female with a past medical history Of CKD stage IV, lymphedema/venous insufficiency, controlled type 2 diabetes  that presents the clinic for a 90-month history of bilateral lower extremity wounds. She has difficulty controlling the edema and is currently on metolazone 2.5 mg once weekly and torsemide 40 mg daily. She sleeps in a recliner. She has been using antibiotic ointment to the wound beds. She states she has Juxta lite compressions which she uses daily. She currently denies signs of infection. 4/23; patient presents for follow-up. We have been using Santyl and Hydrofera Blue under Kerlix/Coban to the lower extremities bilaterally. She tolerated this well. 4/30; patient presents for follow-up. We have been using silver alginate under 3 layer compression. Patient did not tolerate the wraps and took these off last week. She has significant swelling on exam. She does report that her cardiologist recommended increasing the diuretic regimen at one point to help with her lower extremity edema however she did not do this. 5/6; we have been using silver alginate and Coflex Unna boot. She was able to tolerate this. She does not tolerate tight compression well. She has wounds on her bilateral lower extremities some of which are measuring smaller the area on the left medial is unchanged. The patient sleeps in her recliner tells me that she is not able to elevate her legs much because of musculoskeletal pain. She has a history of having external compression stockings but they are old and will probably need a new pair she asked [place] to order them for her room [place] I told her we do so when she is close to healing 5/21; patient presents for follow-up. She missed her last clinic appointment due to not feeling well. She took off the compression wraps and has been keeping the areas covered and using compression stockings. She did tolerate the 3 layer compression wrap well when placed during the nurse visit on 5/7. She has a new wound to the lateral left leg. She denies signs of infection. READMISSION 04/15/2023: She returns  to clinic today with new bilateral lower extremity wounds. She says that her legs started weeping and her daughter applied Neosporin, Kerlix and Coban. This seemed to help the swelling tremendously but she still has small open areas on her legs. Her Fabian November wraps are old and stretched out and not providing adequate compression at this time. 05/07/2023: She has only had RN visits the past 2 weeks due to clinic scheduling issues. Her legs are markedly edematous and she has a new ulcer that has opened up on the left anterior lower leg. 05/16/2023: All of her wounds have gotten larger this week despite 3 layer compression equivalent. Edema control is extremely poor. She is audibly fluid overloaded, based upon her breathing. Electronic Signature(s) Signed: 05/16/2023 11:02:50 AM By: Duanne Guess MD FACS Entered By: Duanne Guess on 05/16/2023 08:02:50 -------------------------------------------------------------------------------- Paring/cutting 1 benign hyperkeratotic lesion Details Patient Name: Date of Service: ALIVEAH, HOWERY 05/16/2023 10:15 A M Medical Record Number: [id number] Patient Account Number: [id number] LEONELLA, SYLVAN ([id number]) [id number] Page 3 of 13 Date of Birth/Sex: Treating RN: Feb 01, 1941 (82 y.o. [name] Primary Care Provider: Other Clinician: Aliene Beams Referring Provider: Treating Provider/Extender: Lady Gary,  Kathee Delton, Fleet Contras Weeks in Treatment: 4 Procedure Performed for: Non-Wound Location Performed By: Physician Duanne Guess, MD Post Procedure Diagnosis Same as Pre-procedure Notes right 1st metatarsal head using curette Electronic Signature(s) Signed: 05/16/2023 12:27:20 PM By: Duanne Guess MD FACS Signed: 05/16/2023 4:53:04 PM By: Zenaida Deed RN, BSN Entered By: Zenaida Deed on 05/16/2023 07:50:56 -------------------------------------------------------------------------------- Chemical Cauterization  Details Patient Name: Date of Service: WAYNA, MORILLO 05/16/2023 10:15 A M Medical Record Number: [id number] Patient Account Number: [id number] Date of Birth/Sex: Treating RN: 1941-04-20 (82 y.o. [name] Primary Care Provider: Aliene Beams Other Clinician: Referring Provider: Treating Provider/Extender: Karna Dupes in Treatment: 4 Procedure Performed for: Wound #11 Right,Lateral Lower Leg Performed By: Physician Duanne Guess, MD Post Procedure Diagnosis Same as Pre-procedure Notes used silver nitrate sticks Electronic Signature(s) Signed: 05/16/2023 12:27:20 PM By: Duanne Guess MD FACS Signed: 05/16/2023 4:53:04 PM By: Zenaida Deed RN, BSN Entered By: Zenaida Deed on 05/16/2023 07:51:27 -------------------------------------------------------------------------------- Physical Exam Details Patient Name: Date of Service: KHALAYA, KROH 05/16/2023 10:15 A M Medical Record Number: [id number] Patient Account Number: [id number] Date of Birth/Sex: Treating RN: 12-30-1940 (82 y.o. [name] Primary Care Provider: Aliene Beams Other Clinician: Referring Provider: Treating Provider/Extender: Karna Dupes in Treatment: 4 Constitutional . . . . no acute distress. Respiratory Normal work of breathing on room air. WINONA, SERLIN ([id number]) [id number] Page 4 of 13 Notes 05/16/2023: All of her wounds have gotten larger this week despite 3 layer compression equivalent. Edema control is extremely poor. Electronic Signature(s) Signed: 05/16/2023 11:03:31 AM By: Duanne Guess MD FACS Entered By: Duanne Guess on 05/16/2023 08:03:31 -------------------------------------------------------------------------------- Physician Orders Details Patient Name: Date of Service: LEXIANA, BI 05/16/2023 10:15 A M Medical Record Number: [id number] Patient Account Number: [id number] Date of  Birth/Sex: Treating RN: [birth year] (82 y.o. [name] Primary Care Provider: Aliene Beams Other Clinician: Referring Provider: Treating Provider/Extender: Karna Dupes in Treatment: 4 Verbal / Phone Orders: No Diagnosis Coding ICD-10 Coding Code Description L97.811 Non-pressure chronic ulcer of other part of right lower leg limited to breakdown of skin L97.821 Non-pressure chronic ulcer of other part of left lower leg limited to breakdown of skin L84 Corns and callosities I89.0 Lymphedema, not elsewhere classified I87.2 Venous insufficiency (chronic) (peripheral) E11.622 Type 2 diabetes mellitus with other skin ulcer I50.9 Heart failure, unspecified N18.4 Chronic kidney disease, stage 4 (severe) Follow-up Appointments ppointment in 1 week. - Dr. Lady Gary RM 3 Return A Wed 9/11 @ 2:45 pm Bathing/ Shower/ Hygiene May shower with protection but do not get wound dressing(s) wet. Protect dressing(s) with water repellant cover (for example, large plastic bag) or a cast cover and may then take shower. Edema Control - Lymphedema / SCD / Other Elevate legs to the level of the heart or above for 30 minutes daily and/or when sitting for 3-4 times a day throughout the day. Avoid standing for long periods of time. Exercise regularly Wound Treatment Wound #10 - Lower Leg Wound Laterality: Left, Medial Peri-Wound Care: Sween Lotion (Moisturizing lotion) 1 x Per Week/30 Days Discharge Instructions: Apply moisturizing lotion as directed Prim Dressing: Maxorb Extra Ag+ Alginate Dressing, 4x4.75 (in/in) 1 x Per Week/30 Days ary Discharge Instructions: Apply to wound bed as instructed Secondary Dressing: ABD Pad, 8x10 1 x Per Week/30 Days Discharge Instructions: Apply over primary dressing as directed. Compression Wrap: Urgo K2 Lite, (equivalent to a 3 layer) two layer compression system, regular 1 x Per Week/30 Days Discharge Instructions:  Apply Urgo K2 Lite as  directed (alternative to 3 layer compression). Wound #11 - Lower Leg Wound Laterality: Right, Lateral Peri-Wound Care: Sween Lotion (Moisturizing lotion) 1 x Per Week/30 Days Discharge Instructions: Apply moisturizing lotion as directed Prim Dressing: Maxorb Extra Ag+ Alginate Dressing, 4x4.75 (in/in) 1 x Per Week/30 Days ary Discharge Instructions: Apply to wound bed as instructed LOYE, HAFEY ([id number]) [id number] Page 5 of 13 Secondary Dressing: ABD Pad, 8x10 1 x Per Week/30 Days Discharge Instructions: Apply over primary dressing as directed. Compression Wrap: Urgo K2 Lite, (equivalent to a 3 layer) two layer compression system, regular 1 x Per Week/30 Days Discharge Instructions: Apply Urgo K2 Lite as directed (alternative to 3 layer compression). Wound #12 - Lower Leg Wound Laterality: Left, Anterior Peri-Wound Care: Zinc Oxide Ointment 30g tube 1 x Per Week/30 Days Discharge Instructions: Apply Zinc Oxide to periwound with each dressing change Peri-Wound Care: Sween Lotion (Moisturizing lotion) 1 x Per Week/30 Days Discharge Instructions: Apply moisturizing lotion as directed Prim Dressing: Maxorb Extra Ag+ Alginate Dressing, 4x4.75 (in/in) 1 x Per Week/30 Days ary Discharge Instructions: Apply to wound bed as instructed Secondary Dressing: ABD Pad, 8x10 1 x Per Week/30 Days Discharge Instructions: Apply over primary dressing as directed. Secondary Dressing: Zetuvit Plus 4x8 in 1 x Per Week/30 Days Discharge Instructions: Apply over primary dressing as directed. Compression Wrap: Urgo K2 Lite, (equivalent to a 3 layer) two layer compression system, regular 1 x Per Week/30 Days Discharge Instructions: Apply Urgo K2 Lite as directed (alternative to 3 layer compression). Electronic Signature(s) Signed: 05/16/2023 12:27:20 PM By: Duanne Guess MD FACS Entered By: Duanne Guess on 05/16/2023  08:05:26 -------------------------------------------------------------------------------- Problem List Details Patient Name: Date of Service: ZENAYAH, CICCARELLO 05/16/2023 10:15 A M Medical Record Number: [id number] Patient Account Number: [id number] Date of Birth/Sex: Treating RN: October 02, 1940 (82 y.o. [place], Linda Primary Care Provider: Aliene Beams Other Clinician: Referring Provider: Treating Provider/Extender: Karna Dupes in Treatment: 4 Active Problems ICD-10 Encounter Code Description Active Date MDM Diagnosis L97.811 Non-pressure chronic ulcer of other part of right lower leg limited to breakdown 04/15/2023 No Yes of skin L97.821 Non-pressure chronic ulcer of other part of left lower leg limited to breakdown 04/15/2023 No Yes of skin L84 Corns and callosities 05/16/2023 No Yes I89.0 Lymphedema, not elsewhere classified 04/15/2023 No Yes I87.2 Venous insufficiency (chronic) (peripheral) 04/15/2023 No Yes [place], [place] ([id number]) [id number] Page 6 of 13 E11.622 Type 2 diabetes mellitus with other skin ulcer 04/15/2023 No Yes I50.9 Heart failure, unspecified 04/15/2023 No Yes N18.4 Chronic kidney disease, stage 4 (severe) 04/15/2023 No Yes Inactive Problems Resolved Problems Electronic Signature(s) Signed: 05/16/2023 11:01:05 AM By: Duanne Guess MD FACS Previous Signature: 05/16/2023 10:44:32 AM Version By: Duanne Guess MD FACS Entered By: Duanne Guess on 05/16/2023 08:01:05 -------------------------------------------------------------------------------- Progress Note Details Patient Name: Date of Service: JENNAROSE, SANE 05/16/2023 10:15 A M Medical Record Number: [id number] Patient Account Number: [id number] Date of Birth/Sex: Treating RN: 1941-05-19 (82 y.o. [name] Primary Care Provider: Aliene Beams Other Clinician: Referring Provider: Treating Provider/Extender: Karna Dupes in  Treatment: 4 Subjective Chief Complaint Information obtained from Patient 03/28/2020; patient is here for review of wounds on her bilateral lower legs 12/25/2022; bilateral lower extremity wound 04/15/2023: BLE wounds History of Present Illness (HPI) ADMISSION 03/28/2020 This is a 82 year old woman who is accompanied by her daughter. She is moving to [place] from [place] where she lives. Infectious been going to the wound care center in [place]  for the last 2 months. She apparently did not tolerate compression early on in that clinic stay. She is just using dry gauze over the wounds when she came in today. She tells me she has had both arterial and venous reflux studies although she does not have copies of these. She was apparently offered an ablation but I have no further information on that. She also is a type II diabetic. She was noted to have wounds on her lower extremities during a CHF clinic visit with Dr. Gala Romney and she was referred here. She has several scattered areas on the right medial lower leg and a large area of superficial ulceration on the posterior medial left lower leg. past medical history; chronic kidney disease stage III, gastroesophageal reflux disease, type 2 diabetes with neuropathy, chronic diastolic heart failure, obstructive sleep apnea, hypertension, restless leg syndrome, pulmonary venous hypertension, history of colon CA, pacemaker and apparently venous reflux. We did not do arterial studies in the clinic today we are going to try to get the results that were already done within the last 2 months in [place] 7/26; patient readmitted to the clinic last week with predominantly chronic venous wounds almost circumferentially in the left lower leg and the right leg medially. She also has secondary lymphedema. We put her in compression. She went to the ER in [place] on Friday they remove the wrap on the left leg diagnosed her with cellulitis and put her on  doxycycline. They apparently also did a ultrasound that was negative for DVT . We did not get any vascular information from [facility] where she apparently had arterial studies and venous studies. She is currently moving from [place] to [place] is up on her feet quite a bit. She was referred to vein and vascular here but never got to the appointment. ABIs were obtained here at 0.89 on the right and 1.02 on the left 8/9-Patient returns after establishing in clinic last visit, we have been doing 3 layer compression on both sides with calcium alginate, she is following up with the vein and vascular when she gets an appointment. The right side is healed the left side is still got some small open areas She has appt on Wed at Vein and vascular at which point she will come in for Nurse visit here 8/16; the patient arrives with all of her wounds closed. She has bilateral Juzo stockings. She saw Dr. Darrick Penna on 8/11 to review her reflux studies from the same day. On the right she had no evidence of DVT or SVT She did have deep venous reflux in the common femoral vein as well as superficial venous reflux in the . small saphenous vein of the mid calf on the left again no thrombus in the deep vein or superficial veins. Deep vein reflux in the common femoral vein as well as the great saphenous vein. She saw Dr. Darrick Penna in consult. Also of note he noted that she had a normal arterial scan done in [place] in April 2021, we were never able to obtain this. His feeling was that the patient did have a dilated greater saphenous vein however minimal to no evidence of reflux in her superficial venous system. She also was felt to have mild deep vein reflux and some degree of lymphedema. He recommended continued compression of the lower Lio, Adlean ([id number]) [phone].pdf Page 7 of 13 extremities with intermittent Unna boots if necessary. She was told to elevate her legs. If she  develops recurrent ulcerations they were  not opposed to revisiting the repeat venous reflux exam to see if she develops worsening reflux for consideration of laser ablation READMISSION 07/26/2020 Patient is now an 82 year old woman who lives in [place]. We had her in the clinic in the summer with wounds on her bilateral lower legs secondary to chronic venous insufficiency with some degree of lymphedema. She was seen by Dr. Darrick Penna in August. This was predominantly to review her venous reflux. She quoted normal arterial studies done in [place] in April 2021 but I have never been able to see these myself.Marland Kitchen His feeling was the patient did have a bilateral great dilated greater saphenous vein however minimal to no evidence of reflux in her superficial venous system. She was also felt to have mild deep vein reflux and some degree of lymphedema. He recommended compression and leg elevation.Marland Kitchen He was not opposed to repeating her venous reflux studies at some point if she develops worse to see if she develops worsening reflux for consideration of ablation. We discharged the patient with juxta lite stockings bilaterally and she has been compliant with wearing these. Unfortunately in September she had a fall suffering a right tibial fracture requiring an IM nail with a rod. She also had fibular fractures in at least one place. She was sent to skilled facility for rehabilitation she is now back at home. She had a DVT rule out study on 9/24 that was negative for DVT . She tells [place] that in the last 2 or 3 weeks she is developed blisters on her right lower extremity that ruptured into 2 open wounds. She has not been able to get these to close over. She religiously uses her juxta lite stockings on both legs and she gets them on is tight as she can. Originally the left leg was the larger of the 2 legs but since the patient had surgery the right leg is more swollen and tense than the left. She also tells me she had  a second fall yesterday and has had pain in the left dorsal midfoot. She has diabetic neuropathy with Charcot foot left greater than right. Past medical history includes chronic kidney disease, hypertension, type 2 diabetes with peripheral neuropathy, congestive heart failure, hypertension, history of colon CA, lumbar stenosis ABIs in our clinic were noncompressible bilaterally 11/30; the wounds in the right lower extremity are healed anteriorly. She has both Tubigrip and her juxta lite stockings she uses the Tubigrip as the primary layer. We went over this with her today she has the bilateral stockings. She will need to adjust her compression to roughly 30 mmHg 12/25/2022 Mrs. Maryfrances Edmond is a 82 year old female with a past medical history Of CKD stage IV, lymphedema/venous insufficiency, controlled type 2 diabetes that presents the clinic for a 21-month history of bilateral lower extremity wounds. She has difficulty controlling the edema and is currently on metolazone 2.5 mg once weekly and torsemide 40 mg daily. She sleeps in a recliner. She has been using antibiotic ointment to the wound beds. She states she has Juxta lite compressions which she uses daily. She currently denies signs of infection. 4/23; patient presents for follow-up. We have been using Santyl and Hydrofera Blue under Kerlix/Coban to the lower extremities bilaterally. She tolerated this well. 4/30; patient presents for follow-up. We have been using silver alginate under 3 layer compression. Patient did not tolerate the wraps and took these off last week. She has significant swelling on exam. She does report that her cardiologist recommended increasing the diuretic regimen  at one point to help with her lower extremity edema however she did not do this. 5/6; we have been using silver alginate and Coflex Unna boot. She was able to tolerate this. She does not tolerate tight compression well. She has wounds on her bilateral lower  extremities some of which are measuring smaller the area on the left medial is unchanged. The patient sleeps in her recliner tells me that she is not able to elevate her legs much because of musculoskeletal pain. She has a history of having external compression stockings but they are old and will probably need a new pair she asked [place] to order them for her room ALT I told her we do so when she is close to healing 5/21; patient presents for follow-up. She missed her last clinic appointment due to not feeling well. She took off the compression wraps and has been keeping the areas covered and using compression stockings. She did tolerate the 3 layer compression wrap well when placed during the nurse visit on 5/7. She has a new wound to the lateral left leg. She denies signs of infection. READMISSION 04/15/2023: She returns to clinic today with new bilateral lower extremity wounds. She says that her legs started weeping and her daughter applied Neosporin, Kerlix and Coban. This seemed to help the swelling tremendously but she still has small open areas on her legs. Her Fabian November wraps are old and stretched out and not providing adequate compression at this time. 05/07/2023: She has only had RN visits the past 2 weeks due to clinic scheduling issues. Her legs are markedly edematous and she has a new ulcer that has opened up on the left anterior lower leg. 05/16/2023: All of her wounds have gotten larger this week despite 3 layer compression equivalent. Edema control is extremely poor. She is audibly fluid overloaded, based upon her breathing. Patient History Information obtained from Patient. Family History Diabetes - Siblings, Heart Disease - Mother, Hypertension - Mother, Kidney Disease - Siblings, No family history of Cancer, Hereditary Spherocytosis, Lung Disease, Seizures, Stroke, Thyroid Problems, Tuberculosis. Social History Never smoker, Marital Status - Widowed, Alcohol Use - Rarely, Drug Use - No  History, Caffeine Use - Daily. Medical History Eyes Denies history of Cataracts, Glaucoma, Optic Neuritis Ear/Nose/Mouth/Throat Denies history of Chronic sinus problems/congestion, Middle ear problems Hematologic/Lymphatic Patient has history of Lymphedema Denies history of Anemia, Hemophilia, Human Immunodeficiency Virus, Sickle Cell Disease Respiratory Denies history of Aspiration, Asthma, Chronic Obstructive Pulmonary Disease (COPD), Pneumothorax, Sleep Apnea, Tuberculosis Cardiovascular Patient has history of Congestive Heart Failure, Hypertension, Peripheral Venous Disease Denies history of Angina, Arrhythmia, Coronary Artery Disease, Deep Vein Thrombosis, Hypotension, Myocardial Infarction, Peripheral Arterial Disease, Phlebitis, Vasculitis Gastrointestinal Hildreth, Aliscia ([id number]) [id number] Page 8 of 13 Denies history of Cirrhosis , Colitis, Crohns, Hepatitis A, Hepatitis B, Hepatitis C Endocrine Patient has history of Type II Diabetes Denies history of Type I Diabetes Genitourinary Denies history of End Stage Renal Disease Immunological Denies history of Lupus Erythematosus, Raynauds, Scleroderma Integumentary (Skin) Denies history of History of Burn Musculoskeletal Patient has history of Osteoarthritis Neurologic Patient has history of Neuropathy Denies history of Dementia, Quadriplegia, Paraplegia, Seizure Disorder Oncologic Denies history of Received Chemotherapy, Received Radiation Psychiatric Patient has history of Confinement Anxiety Denies history of Anorexia/bulimia Hospitalization/Surgery History - tibia nail insertion right. - cardiac cath. - lumbar laminectomy. - pacemaker implanted. - appendectomy. - lumpectomy left breast. - DandC uterus. - partial nephrectomy. - tosillectomy. - tubal ligation. Medical A Surgical History Notes nd Constitutional Symptoms (  General Health) obesity Cardiovascular dyslipidemia,  pacemaker Gastrointestinal GERD Endocrine thyroid nodule Genitourinary CKD stage 3 Musculoskeletal fibromyalgia, lumbar spinal stenosis Oncologic hx colon CA Objective Constitutional no acute distress. Vitals Time Taken: 10:24 AM, Height: 63 in, Source: Stated, Weight: 209 lbs, Source: Stated, BMI: 37, Temperature: 98.5 F, Pulse: 73 bpm, Respiratory Rate: 22 breaths/min, Blood Pressure: 137/74 mmHg, Capillary Blood Glucose: 122 mg/dl. General Notes: glucose per pt report this am Respiratory Normal work of breathing on room air. General Notes: 05/16/2023: All of her wounds have gotten larger this week despite 3 layer compression equivalent. Edema control is extremely poor. Integumentary (Hair, Skin) Wound #10 status is Open. Original cause of wound was Gradually Appeared. The date acquired was: 03/25/2023. The wound has been in treatment 4 weeks. The wound is located on the Left,Medial Lower Leg. The wound measures 3.8cm length x 2.5cm width x 0.1cm depth; 7.461cm^2 area and 0.746cm^3 volume. There is Fat Layer (Subcutaneous Tissue) exposed. There is no tunneling or undermining noted. There is a medium amount of serous drainage noted. The wound margin is flat and intact. There is large (67-100%) red granulation within the wound bed. There is no necrotic tissue within the wound bed. The periwound skin appearance had no abnormalities noted for texture. The periwound skin appearance had no abnormalities noted for moisture. The periwound skin appearance exhibited: Hemosiderin Staining. Periwound temperature was noted as No Abnormality. Wound #11 status is Open. Original cause of wound was Gradually Appeared. The date acquired was: 03/25/2023. The wound has been in treatment 4 weeks. The wound is located on the Right,Lateral Lower Leg. The wound measures 3.5cm length x 4cm width x 0.1cm depth; 10.996cm^2 area and 1.1cm^3 volume. There is Fat Layer (Subcutaneous Tissue) exposed. There is no  tunneling or undermining noted. There is a medium amount of serous drainage noted. The wound margin is flat and intact. There is large (67-100%) red granulation within the wound bed. There is no necrotic tissue within the wound bed. The periwound skin appearance had no abnormalities noted for texture. The periwound skin appearance had no abnormalities noted for moisture. The periwound skin appearance exhibited: Hemosiderin Staining. Periwound temperature was noted as No Abnormality. Wound #12 status is Open. Original cause of wound was Gradually Appeared. The date acquired was: 05/07/2023. The wound has been in treatment 1 weeks. The wound is located on the Left,Anterior Lower Leg. The wound measures 5.3cm length x 6.5cm width x 0.1cm depth; 27.057cm^2 area and 2.706cm^3 volume. There is Fat Layer (Subcutaneous Tissue) exposed. There is no tunneling or undermining noted. There is a large amount of serous drainage noted. The wound margin is indistinct and nonvisible. There is large (67-100%) red granulation within the wound bed. There is no necrotic tissue within the wound bed. The periwound skin appearance had no abnormalities noted for moisture. The periwound skin appearance exhibited: Excoriation, Hemosiderin Staining. Periwound temperature was noted as No Abnormality. 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 There was a  Double Layer Compression Therapy Procedure by Zenaida Deed, RN. Post  procedure Diagnosis Wound #10: Same as Pre-Procedure Notes: urgo lite. Wound #11 Pre-procedure diagnosis of Wound #11 is a Lymphedema located on the Right,Lateral Lower Leg . There was a Double Layer Compression Therapy Procedure by Zenaida Deed, RN. Post procedure Diagnosis Wound #11: Same as Pre-Procedure Notes: urgo lite. Pre-procedure diagnosis of Wound #11 is a Lymphedema located on the Right,Lateral Lower Leg . An Chemical Cauterization procedure was performed by Duanne Guess, MD. Post procedure Diagnosis Wound #11: Same as Pre-Procedure Notes: used silver nitrate sticks A Paring/cutting 1 benign hyperkeratotic lesion procedure was performed. by Duanne Guess, MD. Post procedure Diagnosis Wound #: Same as Pre-Procedure Notes: right 1st metatarsal head using curette Plan Follow-up Appointments: Return Appointment in 1 week. - Dr. Lady Gary RM 3 Wed 9/11 @ 2:45 pm Bathing/ Shower/ Hygiene: May shower with protection but do not get wound dressing(s) wet. Protect dressing(s) with water repellant cover (for example, large plastic bag) or a cast cover and may then take shower. Edema Control - Lymphedema / SCD / Other: Elevate legs to the level of the heart or above for 30 minutes daily and/or when sitting for 3-4 times a day throughout the day. Avoid standing for long periods of time. Exercise regularly WOUND #10: - Lower Leg Wound Laterality: Left, Medial Peri-Wound Care: Sween Lotion (Moisturizing lotion) 1 x Per Week/30 Days Discharge Instructions: Apply moisturizing lotion as directed Prim Dressing: Maxorb Extra Ag+ Alginate Dressing, 4x4.75 (in/in) 1 x Per Week/30 Days ary Discharge Instructions: Apply to wound bed as instructed Secondary Dressing: ABD Pad, 8x10 1 x Per Week/30 Days Discharge Instructions: Apply over primary dressing as directed. Com pression Wrap: Urgo K2 Lite, (equivalent to a 3  layer) two layer compression system, regular 1 x Per Week/30 Days Discharge Instructions: Apply Urgo K2 Lite as directed (alternative to 3 layer compression). WOUND #11: - Lower Leg Wound Laterality: Right, Lateral Peri-Wound Care: Sween Lotion (Moisturizing lotion) 1 x Per Week/30 Days Discharge Instructions: Apply moisturizing lotion as directed Prim Dressing: Maxorb Extra Ag+ Alginate Dressing, 4x4.75 (in/in) 1 x Per Week/30 Days ary Discharge Instructions: Apply to wound bed as instructed Secondary Dressing: ABD Pad, 8x10 1 x Per Week/30 Days Discharge Instructions: Apply over primary dressing as directed. Com pression Wrap: Urgo K2 Lite, (equivalent to a 3 layer) two layer compression system, regular 1 x Per Week/30 Days Discharge Instructions: Apply Urgo K2 Lite as directed (alternative to 3 layer compression). WOUND #12: - Lower Leg Wound Laterality: Left, Anterior Peri-Wound Care: Zinc Oxide Ointment 30g tube 1 x Per Week/30 Days Discharge Instructions: Apply Zinc Oxide to periwound with each dressing change Peri-Wound Care: Sween Lotion (Moisturizing lotion) 1 x Per Week/30 Days Discharge Instructions: Apply moisturizing lotion as directed Prim Dressing: Maxorb Extra Ag+ Alginate Dressing, 4x4.75 (in/in) 1 x Per Week/30 Days ary Discharge Instructions: Apply to wound bed as instructed Secondary Dressing: ABD Pad, 8x10 1 x Per Week/30 Days Discharge Instructions: Apply over primary dressing as directed. Secondary Dressing: Zetuvit Plus 4x8 in 1 x Per Week/30 Days Discharge Instructions: Apply over primary dressing as directed. Com pression Wrap: Urgo K2 Lite, (equivalent to a 3 layer) two layer compression system, regular 1 x Per Week/30 Days Discharge Instructions: Apply Urgo K2 Lite as directed (alternative to 3 layer compression). SHELMA, MILLWOOD ([id number]) [id number] Page 10 of 13 05/16/2023: All of her wounds have gotten larger this week despite 3  layer compression equivalent. Edema control is extremely poor. She is audibly fluid overloaded, based upon her breathing. Her wounds did  not require debridement, but she has a callus on her right first metatarsal head that is thickened and bothering her. I used a curette to pare this back to the point of comfort. In addition, the hypertrophic granulation tissue on the right anterior tibial surface was chemically cauterized with silver nitrate. We will apply silver alginate to her wounds and bilateral Urgo lite 3 layer compression equivalent. Fortunately, she has an appointment with her cardiologist tomorrow and hopefully her fluid status will be addressed at that time. Follow-up in 1 week. Electronic Signature(s) Signed: 05/16/2023 11:06:32 AM By: Duanne Guess MD FACS Entered By: Duanne Guess on 05/16/2023 08:06:32 -------------------------------------------------------------------------------- HxROS Details Patient Name: Date of Service: JALEXUS, PUHL 05/16/2023 10:15 A M Medical Record Number: [id number] Patient Account Number: [id number] Date of Birth/Sex: Treating RN: 25-Jun-1941 (82 y.o. [name] Primary Care Provider: Aliene Beams Other Clinician: Referring Provider: Treating Provider/Extender: Karna Dupes in Treatment: 4 Information Obtained From Patient Constitutional Symptoms (General Health) Medical History: Past Medical History Notes: obesity Eyes Medical History: Negative for: Cataracts; Glaucoma; Optic Neuritis Ear/Nose/Mouth/Throat Medical History: Negative for: Chronic sinus problems/congestion; Middle ear problems Hematologic/Lymphatic Medical History: Positive for: Lymphedema Negative for: Anemia; Hemophilia; Human Immunodeficiency Virus; Sickle Cell Disease Respiratory Medical History: Negative for: Aspiration; Asthma; Chronic Obstructive Pulmonary Disease (COPD); Pneumothorax; Sleep Apnea;  Tuberculosis Cardiovascular Medical History: Positive for: Congestive Heart Failure; Hypertension; Peripheral Venous Disease Negative for: Angina; Arrhythmia; Coronary Artery Disease; Deep Vein Thrombosis; Hypotension; Myocardial Infarction; Peripheral Arterial Disease; Phlebitis; Vasculitis Past Medical History Notes: dyslipidemia, pacemaker Gastrointestinal Medical History: Negative for: Cirrhosis ; Colitis; Crohns; Hepatitis A; Hepatitis B; Hepatitis C Past Medical History Notes: GERD ZEANA, DEYOUNG ([id number]) [id number] Page 11 of 13 Endocrine Medical History: Positive for: Type II Diabetes Negative for: Type I Diabetes Past Medical History Notes: thyroid nodule Time with diabetes: 15 Treated with: Insulin Blood sugar tested every day: Yes Tested : 2-3 times per day Genitourinary Medical History: Negative for: End Stage Renal Disease Past Medical History Notes: CKD stage 3 Immunological Medical History: Negative for: Lupus Erythematosus; Raynauds; Scleroderma Integumentary (Skin) Medical History: Negative for: History of Burn Musculoskeletal Medical History: Positive for: Osteoarthritis Past Medical History Notes: fibromyalgia, lumbar spinal stenosis Neurologic Medical History: Positive for: Neuropathy Negative for: Dementia; Quadriplegia; Paraplegia; Seizure Disorder Oncologic Medical History: Negative for: Received Chemotherapy; Received Radiation Past Medical History Notes: hx colon CA Psychiatric Medical History: Positive for: Confinement Anxiety Negative for: Anorexia/bulimia Immunizations Pneumococcal Vaccine: Received Pneumococcal Vaccination: No Implantable Devices Yes Hospitalization / Surgery History Type of Hospitalization/Surgery tibia nail insertion right cardiac cath lumbar laminectomy pacemaker implanted appendectomy lumpectomy left breast DandC uterus partial nephrectomy tosillectomy tubal  ligation Family and Social History Cancer: No; Diabetes: Yes - Siblings; Heart Disease: Yes - Mother; Hereditary Spherocytosis: No; Hypertension: Yes - Mother; Kidney DiseaseAdiella Laflash, Bosie Clos ([id number]) [id number] Page 12 of 13 Siblings; Lung Disease: No; Seizures: No; Stroke: No; Thyroid Problems: No; Tuberculosis: No; Never smoker; Marital Status - Widowed; Alcohol Use: Rarely; Drug Use: No History; Caffeine Use: Daily; Financial Concerns: No; Food, Clothing or Shelter Needs: No; Support System Lacking: No; Transportation Concerns: No Psychologist, prison and probation services) Signed: 05/16/2023 12:27:20 PM By: Duanne Guess MD FACS Signed: 05/16/2023 4:53:04 PM By: Zenaida Deed RN, BSN Entered By: Duanne Guess on 05/16/2023 08:03:04 -------------------------------------------------------------------------------- SuperBill Details Patient Name: Date of Service: COREAN, KINION 05/16/2023 Medical Record Number: [id number] Patient Account Number: [id number] Date of Birth/Sex: Treating RN: 1941-06-18 (82 y.o. [name] Primary Care Provider: Aliene Beams  Other Clinician: Referring Provider: Treating Provider/Extender: Karna Dupes in Treatment: 4 Diagnosis Coding ICD-10 Codes Code Description [phone] Non-pressure chronic ulcer of other part of right lower leg limited to breakdown of skin L97.821 Non-pressure chronic ulcer of other part of left lower leg limited to breakdown of skin L84 Corns and callosities I89.0 Lymphedema, not elsewhere classified I87.2 Venous insufficiency (chronic) (peripheral) E11.622 Type 2 diabetes mellitus with other skin ulcer I50.9 Heart failure, unspecified N18.4 Chronic kidney disease, stage 4 (severe) Facility Procedures : CPT4: Code [id number] Description: 55 - PARE BENIGN LES; SGL ICD-10 Diagnosis Description L84 Corns and callosities Modifier: Quantity: 1 : CPT4: [id number] Description:  50 - CHEM CAUT GRANULATION TISS ICD-10 Diagnosis Description L97.811 Non-pressure chronic ulcer of other part of right lower leg limited to breakdown of skin Modifier: Quantity: 1 : CPT4: [id number] Description: 81 BILATERAL: Application of multi-layer venous compression system; leg (below knee), including ankle and t. Modifier: 59 Quantity: 1 Physician Procedures : CPT4 Code Description Modifier [id number] - WC PHYS LEVEL 3 - EST PT 25 ICD-10 Diagnosis Description L97.811 Non-pressure chronic ulcer of other part of right lower leg limited to breakdown of skin L97.821 Non-pressure chronic ulcer of other part of  left lower leg limited to breakdown of skin L84 Corns and callosities I89.0 Lymphedema, not elsewhere classified Quantity: 1 : [id number] - WC PHYS PARE BENIGN LES; SGL ICD-10 Diagnosis Description L84 Corns and callosities Quantity: 1 : [id number] - WC PHYS CHEM CAUT GRAN TISSUE ICD-10 Diagnosis Description L97.811 Non-pressure chronic ulcer of other part of right lower leg limited to breakdown of skin Rocchi, Mersadies ([id number]) [id number] Pa Quantity: 1 ge 13 of 13 Electronic Signature(s) Signed: 05/16/2023 12:27:20 PM By: Duanne Guess MD FACS Signed: 05/16/2023 4:53:04 PM By: Zenaida Deed RN, BSN Previous Signature: 05/16/2023 11:07:03 AM Version By: Duanne Guess MD FACS Entered By: Zenaida Deed on 05/16/2023 08:10:47

## 2023-05-17 ENCOUNTER — Ambulatory Visit (HOSPITAL_BASED_OUTPATIENT_CLINIC_OR_DEPARTMENT_OTHER)
Admission: RE | Admit: 2023-05-17 | Discharge: 2023-05-17 | Disposition: A | Discharge status: Home / Self Care | Payer: Medicare Other | Source: Ambulatory Visit | Attending: Internal Medicine | Admitting: Internal Medicine

## 2023-05-17 ENCOUNTER — Other Ambulatory Visit (HOSPITAL_COMMUNITY): Payer: Self-pay

## 2023-05-17 ENCOUNTER — Ambulatory Visit (HOSPITAL_COMMUNITY)
Admission: RE | Admit: 2023-05-17 | Discharge: 2023-05-17 | Disposition: A | Discharge status: Home / Self Care | Payer: Medicare Other | Source: Ambulatory Visit | Attending: Internal Medicine | Admitting: Internal Medicine

## 2023-05-17 DIAGNOSIS — I5032 Chronic diastolic (congestive) heart failure: Secondary | ICD-10-CM

## 2023-05-17 DIAGNOSIS — I350 Nonrheumatic aortic (valve) stenosis: Secondary | ICD-10-CM | POA: Insufficient documentation

## 2023-05-17 LAB — ECHOCARDIOGRAM COMPLETE
AR max vel: 1.34 cm2
AV Area VTI: 1.37 cm2
AV Area mean vel: 1.33 cm2
AV Mean grad: 23.3 mmHg
AV Peak grad: 35.8 mmHg
Ao pk vel: 2.99 m/s
Area-P 1/2: 2.74 cm2
MV VTI: 2.02 cm2
S' Lateral: 3.1 cm

## 2023-05-17 LAB — BASIC METABOLIC PANEL
Anion gap: 13 (ref 5–15)
BUN: 36 mg/dL — ABNORMAL HIGH (ref 8–23)
CO2: 26 mmol/L (ref 22–32)
Calcium: 8.4 mg/dL — ABNORMAL LOW (ref 8.9–10.3)
Chloride: 98 mmol/L (ref 98–111)
Creatinine, Ser: 1.8 mg/dL — ABNORMAL HIGH (ref 0.44–1.00)
GFR, Estimated: 27 mL/min — ABNORMAL LOW (ref >60)
Glucose, Bld: 159 mg/dL — ABNORMAL HIGH (ref 70–99)
Potassium: 4.1 mmol/L (ref 3.5–5.1)
Sodium: 137 mmol/L (ref 135–145)

## 2023-05-17 MED ORDER — POTASSIUM CHLORIDE CRYS ER 10 MEQ PO TBCR: 20.0000 meq | EXTENDED_RELEASE_TABLET | Freq: Every day | ORAL | 11 refills | Status: DC

## 2023-05-22 ENCOUNTER — Other Ambulatory Visit (HOSPITAL_COMMUNITY): Payer: Self-pay | Admitting: Family Medicine

## 2023-05-22 ENCOUNTER — Encounter (HOSPITAL_BASED_OUTPATIENT_CLINIC_OR_DEPARTMENT_OTHER): Payer: Medicare Other | Admitting: General Surgery

## 2023-05-22 DIAGNOSIS — E1122 Type 2 diabetes mellitus with diabetic chronic kidney disease: Secondary | ICD-10-CM | POA: Diagnosis not present

## 2023-05-22 DIAGNOSIS — I130 Hypertensive heart and chronic kidney disease with heart failure and stage 1 through stage 4 chronic kidney disease, or unspecified chronic kidney disease: Secondary | ICD-10-CM | POA: Diagnosis not present

## 2023-05-22 DIAGNOSIS — G4733 Obstructive sleep apnea (adult) (pediatric): Secondary | ICD-10-CM | POA: Diagnosis not present

## 2023-05-22 DIAGNOSIS — I5032 Chronic diastolic (congestive) heart failure: Secondary | ICD-10-CM | POA: Diagnosis not present

## 2023-05-22 DIAGNOSIS — E1142 Type 2 diabetes mellitus with diabetic polyneuropathy: Secondary | ICD-10-CM | POA: Diagnosis not present

## 2023-05-22 DIAGNOSIS — E11622 Type 2 diabetes mellitus with other skin ulcer: Secondary | ICD-10-CM | POA: Diagnosis not present

## 2023-05-22 DIAGNOSIS — L84 Corns and callosities: Secondary | ICD-10-CM | POA: Diagnosis not present

## 2023-05-22 DIAGNOSIS — I872 Venous insufficiency (chronic) (peripheral): Secondary | ICD-10-CM | POA: Diagnosis not present

## 2023-05-22 DIAGNOSIS — L97811 Non-pressure chronic ulcer of other part of right lower leg limited to breakdown of skin: Secondary | ICD-10-CM | POA: Diagnosis not present

## 2023-05-22 DIAGNOSIS — I890 Lymphedema, not elsewhere classified: Secondary | ICD-10-CM | POA: Diagnosis not present

## 2023-05-22 DIAGNOSIS — I272 Pulmonary hypertension, unspecified: Secondary | ICD-10-CM | POA: Diagnosis not present

## 2023-05-22 DIAGNOSIS — E1151 Type 2 diabetes mellitus with diabetic peripheral angiopathy without gangrene: Secondary | ICD-10-CM | POA: Diagnosis not present

## 2023-05-22 DIAGNOSIS — N184 Chronic kidney disease, stage 4 (severe): Secondary | ICD-10-CM | POA: Diagnosis not present

## 2023-05-22 DIAGNOSIS — L97821 Non-pressure chronic ulcer of other part of left lower leg limited to breakdown of skin: Secondary | ICD-10-CM | POA: Diagnosis not present

## 2023-05-22 DIAGNOSIS — Z833 Family history of diabetes mellitus: Secondary | ICD-10-CM | POA: Diagnosis not present

## 2023-05-23 DIAGNOSIS — M5416 Radiculopathy, lumbar region: Secondary | ICD-10-CM | POA: Diagnosis not present

## 2023-05-23 DIAGNOSIS — K5903 Drug induced constipation: Secondary | ICD-10-CM | POA: Diagnosis not present

## 2023-05-23 DIAGNOSIS — M961 Postlaminectomy syndrome, not elsewhere classified: Secondary | ICD-10-CM | POA: Diagnosis not present

## 2023-05-23 DIAGNOSIS — G894 Chronic pain syndrome: Secondary | ICD-10-CM | POA: Diagnosis not present

## 2023-05-23 DIAGNOSIS — M25511 Pain in right shoulder: Secondary | ICD-10-CM | POA: Diagnosis not present

## 2023-05-23 DIAGNOSIS — M5459 Other low back pain: Secondary | ICD-10-CM | POA: Diagnosis not present

## 2023-05-23 DIAGNOSIS — N189 Chronic kidney disease, unspecified: Secondary | ICD-10-CM | POA: Diagnosis not present

## 2023-05-23 NOTE — Progress Notes (Signed)
[name], [name] ([id number]) [id number] Page 1 of 15 Visit Report for 05/22/2023 Arrival Information Details Patient Name: Date of Service: [name], [name] 05/22/2023 2:45 PM Medical Record Number: [id number] Patient Account Number: [id number] Date of Birth/Sex: Treating RN: Mar 15, 1941 (82 y.o. F) Primary Care Rainbow Salman: Aliene Beams Other Clinician: Referring Nino Amano: Treating Chrishun Scheer/Extender: Karna Dupes in Treatment: 5 Visit Information History Since Last Visit All ordered tests and consults were completed: No Patient Arrived: [name] Added or deleted any medications: No Arrival Time: 15:02 Any new allergies or adverse reactions: No Accompanied By: self Had a fall or experienced change in No Transfer Assistance: Stretcher activities of daily living that may affect Patient Identification Verified: Yes risk of falls: Secondary Verification Process Completed: Yes Signs or symptoms of abuse/neglect since last visito No Patient Requires Transmission-Based Precautions: No Hospitalized since last visit: No Patient Has Alerts: No Implantable device outside of the clinic excluding No cellular tissue based products placed in the center since last visit: Has Dressing in Place as Prescribed: Yes Has Compression in Place as Prescribed: Yes Pain Present Now: Yes Electronic Signature(s) Signed: 05/22/2023 5:11:25 PM By: Zenaida Deed RN, BSN Entered By: Zenaida Deed on 05/22/2023 15:16:48 -------------------------------------------------------------------------------- Compression Therapy Details Patient Name: Date of Service: [name], [name] 05/22/2023 2:45 PM Medical Record Number: [id number] Patient Account Number: [id number] Date of Birth/Sex: Treating RN: September 08, 1941 (82 y.o. [name] Primary Care Mishael Krysiak: Aliene Beams Other Clinician: Referring Kendyn Zaman: Treating Kimberlie Csaszar/Extender: Karna Dupes in Treatment: 5 Compression Therapy Performed for Wound Assessment: Wound #10 Left,Medial Lower Leg Performed By: Clinician Zenaida Deed, RN Compression Type: Double Layer Post Procedure Diagnosis Same as Pre-procedure Notes urgo lite Electronic Signature(s) Signed: 05/22/2023 5:11:25 PM By: Zenaida Deed RN, BSN Entered By: Zenaida Deed on 05/22/2023 15:37:51 [name] ([id number]) [id number] Page 2 of 15 -------------------------------------------------------------------------------- Compression Therapy Details Patient Name: Date of Service: [name], [name] 05/22/2023 2:45 PM Medical Record Number: [id number] Patient Account Number: [id number] Date of Birth/Sex: Treating RN: 17-Apr-1941 (82 y.o. [name] Primary Care Tyrick Dunagan: Aliene Beams Other Clinician: Referring Gaila Engebretsen: Treating Cellie Dardis/Extender: Karna Dupes in Treatment: 5 Compression Therapy Performed for Wound Assessment: Wound #11 Right,Lateral Lower Leg Performed By: Clinician Zenaida Deed, RN Compression Type: Double Layer Post Procedure Diagnosis Same as Pre-procedure Notes urgo lite Electronic Signature(s) Signed: 05/22/2023 5:11:25 PM By: Zenaida Deed RN, BSN Entered By: Zenaida Deed on 05/22/2023 15:37:51 -------------------------------------------------------------------------------- Encounter Discharge Information Details Patient Name: Date of Service: [name], [name] 05/22/2023 2:45 PM Medical Record Number: [id number] Patient Account Number: [id number] Date of Birth/Sex: Treating RN: April 09, 1941 (82 y.o. [name] Primary Care Yunique Dearcos: Aliene Beams Other Clinician: Referring Rozina Pointer: Treating Zevin Nevares/Extender: Karna Dupes in Treatment: 5 Encounter Discharge Information Items Post Procedure Vitals Discharge Condition: Stable Temperature (F): 98.5 Ambulatory Status:  Cane Pulse (bpm): 84 Discharge Destination: Home Respiratory Rate (breaths/min): 20 Transportation: Private Auto Blood Pressure (mmHg): 168/72 Accompanied By: self Schedule Follow-up Appointment: Yes Clinical Summary of Care: Patient Declined Electronic Signature(s) Signed: 05/22/2023 5:11:25 PM By: Zenaida Deed RN, BSN Entered By: Zenaida Deed on 05/22/2023 16:46:10 -------------------------------------------------------------------------------- Lower Extremity Assessment Details Patient Name: Date of Service: [name], [name] 05/22/2023 2:45 PM Medical Record Number: [id number] Patient Account Number: [id number] Date of Birth/Sex: Treating RN: Jun 17, 1941 (82 y.o. [name] Primary Care Layci Stenglein: Aliene Beams Other Clinician: Referring Mayes Sangiovanni: Treating Addisynn Vassell/Extender: Karna Dupes in Treatment: 5 [place], [place] ([id number]) [id number] Page 3 of 15 Edema Assessment  Assessed: [Left: No] [Right: No] Edema: [Left: Yes] [Right: Yes] Calf Left: Right: Point of Measurement: From Medial Instep 40.5 cm 40 cm Ankle Left: Right: Point of Measurement: From Medial Instep 27 cm 27 cm Vascular Assessment Pulses: Dorsalis Pedis Palpable: [Left:Yes] [Right:Yes] Extremity colors, hair growth, and conditions: Extremity Color: [Left:Hyperpigmented] [Right:Hyperpigmented] Hair Growth on Extremity: [Left:No] [Right:No] Temperature of Extremity: [Left:Warm] [Right:Warm] Capillary Refill: [Left:< 3 seconds] [Right:< 3 seconds] Dependent Rubor: [Left:No No] [Right:No No] Electronic Signature(s) Signed: 05/22/2023 5:11:25 PM By: Zenaida Deed RN, BSN Entered By: Zenaida Deed on 05/22/2023 15:21:36 -------------------------------------------------------------------------------- Multi Wound Chart Details Patient Name: Date of Service: [name] 05/22/2023 2:45 PM Medical Record Number: [id number] Patient Account  Number: [id number] Date of Birth/Sex: Treating RN: 1941-01-24 (82 y.o. F) Primary Care Jasraj Lappe: Aliene Beams Other Clinician: Referring Dinnis Rog: Treating Mariusz Jubb/Extender: Karna Dupes in Treatment: 5 Vital Signs Height(in): 63 Capillary Blood Glucose(mg/dl): 409 Weight(lbs): 811 Pulse(bpm): 84 Body Mass Index(BMI): 37 Blood Pressure(mmHg): 168/82 Temperature(F): 98.5 Respiratory Rate(breaths/min): 18 [10:Photos:] Left, Medial Lower Leg Right, Lateral Lower Leg Left, Anterior Lower Leg Wound Location: Gradually Appeared Gradually Appeared Gradually Appeared Wounding Event: Lymphedema Lymphedema Lymphedema Primary Etiology: Diabetic Wound/Ulcer of the Lower Diabetic Wound/Ulcer of the Lower Diabetic Wound/Ulcer of the Lower Secondary Etiology: Extremity Extremity Extremity Lymphedema, Congestive Heart Lymphedema, Congestive Heart Lymphedema, Congestive Heart Comorbid History: Failure, Hypertension, Peripheral Failure, Hypertension, Peripheral Failure, Hypertension, Peripheral Venous Disease, Type II Diabetes, Venous Disease, Type II Diabetes, Venous Disease, Type II Diabetes, Schussler, Chamara ([id number]) [id number] Page 4 of 15 Osteoarthritis, Neuropathy, Osteoarthritis, Neuropathy, Osteoarthritis, Neuropathy, Confinement Anxiety Confinement Anxiety Confinement Anxiety 03/25/2023 03/25/2023 05/07/2023 Date A cquired: 5 5 2  Weeks of Treatment: Open Open Open Wound Status: No No No Wound Recurrence: 3.5x2.1x0.1 3.5x4x0.1 3.4x3.5x0.1 Measurements L x W x D (cm) 5.773 10.996 9.346 A (cm) : rea 0.577 1.1 0.935 Volume (cm) : -8031.00% -238.10% 9.80% % Reduction in A rea: -8142.90% -238.50% 9.80% % Reduction in Volume: Full Thickness Without Exposed Full Thickness Without Exposed Full Thickness Without Exposed Classification: Support Structures Support Structures Support Structures Medium Medium Medium Exudate A  mount: Serous Serous Serous Exudate Type: Psychologist, forensic Exudate Color: Indistinct, nonvisible Flat and Intact Indistinct, nonvisible Wound Margin: Large (67-100%) Large (67-100%) Large (67-100%) Granulation A mount: Red Red, Hyper-granulation Red Granulation Quality: None Present (0%) None Present (0%) None Present (0%) Necrotic A mount: Fat Layer (Subcutaneous Tissue): Yes Fat Layer (Subcutaneous Tissue): Yes Fat Layer (Subcutaneous Tissue): Yes Exposed Structures: Fascia: No Fascia: No Fascia: No Tendon: No Tendon: No Tendon: No Muscle: No Muscle: No Muscle: No Joint: No Joint: No Joint: No Bone: No Bone: No Bone: No Large (67-100%) Small (1-33%) Large (67-100%) Epithelialization: N/A N/A N/A Debridement: N/A N/A N/A Tissue Debrided: N/A N/A N/A Level: N/A N/A N/A Debridement A (sq cm): rea N/A N/A N/A Instrument: N/A N/A N/A Bleeding: N/A N/A N/A Hemostasis A chieved: N/A N/A N/A Procedural Pain: N/A N/A N/A Post Procedural Pain: Debridement Treatment Response: N/A N/A N/A Post Debridement Measurements L x N/A N/A N/A W x D (cm) N/A N/A N/A Post Debridement Volume: (cm) No Abnormalities Noted No Abnormalities Noted Excoriation: No Periwound Skin Texture: No Abnormalities Noted No Abnormalities Noted No Abnormalities Noted Periwound Skin Moisture: Hemosiderin Staining: Yes Hemosiderin Staining: Yes Hemosiderin Staining: Yes Periwound Skin Color: No Abnormality No Abnormality No Abnormality Temperature: Compression Therapy Compression Therapy N/A Procedures Performed: Wound Number: 13 14 N/A Photos: No Photos N/A Left, Posterior Lower Leg Left, Lateral Lower Leg  N/A Wound Location: Gradually Appeared Gradually Appeared N/A Wounding Event: Lymphedema Lymphedema N/A Primary Etiology: N/A N/A N/A Secondary Etiology: Lymphedema, Congestive Heart Lymphedema, Congestive Heart N/A Comorbid History: Failure, Hypertension, Peripheral  Failure, Hypertension, Peripheral Venous Disease, Type II Diabetes, Venous Disease, Type II Diabetes, Osteoarthritis, Neuropathy, Osteoarthritis, Neuropathy, Confinement Anxiety Confinement Anxiety 05/22/2023 05/22/2023 N/A Date Acquired: 0 0 N/A Weeks of Treatment: Open Open N/A Wound Status: No No N/A Wound Recurrence: 1.1x1.3x0.1 0.9x1.5x0.1 N/A Measurements L x W x D (cm) 1.123 1.06 N/A A (cm) : rea 0.112 0.106 N/A Volume (cm) : N/A N/A N/A % Reduction in Area: N/A N/A N/A % Reduction in Volume: Full Thickness Without Exposed Full Thickness Without Exposed N/A Classification: Support Structures Support Structures Medium Small N/A Exudate Amount: Serous Serous N/A Exudate Type: Media planner N/A Exudate Color: Flat and Intact Flat and Intact N/A Wound Margin: Medium (34-66%) Large (67-100%) N/A Granulation Amount: Red, Hyper-granulation Red N/A Granulation Quality: Medium (34-66%) Small (1-33%) N/A Necrotic Amount: Fat Layer (Subcutaneous Tissue): Yes Fat Layer (Subcutaneous Tissue): Yes N/A Exposed StructuresXIOMARA, STRAKA ([id number]) [id number] Page 5 of 15 Fascia: No Fascia: No Tendon: No Tendon: No Muscle: No Muscle: No Joint: No Joint: No Bone: No Bone: No Small (1-33%) Medium (34-66%) N/A Epithelialization: Debridement - Selective/Open Wound Debridement - Selective/Open Wound N/A Debridement: Pre-procedure Verification/Time Out 15:45 15:45 N/A Taken: Ambulance person, [employer] N/A Tissue Debrided: Non-Viable Tissue Non-Viable Tissue N/A Level: 1.12 1.06 N/A Debridement A (sq cm): rea Curette Curette N/A Instrument: Minimum Minimum N/A Bleeding: Pressure Pressure N/A Hemostasis A chieved: 0 0 N/A Procedural Pain: 0 0 N/A Post Procedural Pain: Procedure was tolerated well Procedure was tolerated well N/A Debridement Treatment Response: 1.1x1.3x0.1 0.9x1.5x0.1 N/A Post Debridement Measurements L x W x D  (cm) 0.112 0.106 N/A Post Debridement Volume: (cm) No Abnormalities Noted No Abnormalities Noted N/A Periwound Skin Texture: No Abnormalities Noted No Abnormalities Noted N/A Periwound Skin Moisture: Hemosiderin Staining: Yes Hemosiderin Staining: Yes N/A Periwound Skin Color: No Abnormality No Abnormality N/A Temperature: Debridement Debridement N/A Procedures Performed: Treatment Notes Electronic Signature(s) Signed: 05/22/2023 4:11:56 PM By: Duanne Guess MD FACS Entered By: Duanne Guess on 05/22/2023 16:11:56 -------------------------------------------------------------------------------- Multi-Disciplinary Care Plan Details Patient Name: Date of Service: [name], [name] 05/22/2023 2:45 PM Medical Record Number: [id number] Patient Account Number: [id number] Date of Birth/Sex: Treating RN: [birth year] (82 y.o. [name] Primary Care Odell Choung: Aliene Beams Other Clinician: Referring Tramar Brueckner: Treating Anjannette Gauger/Extender: Karna Dupes in Treatment: 5 Multidisciplinary Care Plan reviewed with physician Active Inactive Abuse / Safety / Falls / Self Care Management Nursing Diagnoses: Impaired physical mobility Potential for falls Goals: Patient/caregiver will verbalize/demonstrate measures taken to prevent injury and/or falls Date Initiated: 04/15/2023 Target Resolution Date: 06/17/2023 Goal Status: Active Interventions: Assess fall risk on admission and as needed Assess impairment of mobility on admission and as needed per policy Notes: Nutrition Nursing Diagnoses: Impaired glucose control: actual or potential Potential for alteratiion in Nutrition/Potential for imbalanced nutrition Mayfield, Bonna ([id number]) [id number] Page 6 of 15 Goals: Patient/caregiver will maintain therapeutic glucose control Date Initiated: 04/15/2023 Target Resolution Date: 06/17/2023 Goal Status: Active Interventions: Assess  HgA1c results as ordered upon admission and as needed Assess patient nutrition upon admission and as needed per policy Provide education on elevated blood sugars and impact on wound healing Treatment Activities: Patient referred to Primary Care Physician for further nutritional evaluation : 04/15/2023 Notes: Venous Leg Ulcer Nursing Diagnoses: Potential for venous Insuffiency (use before diagnosis confirmed) Goals: Patient will maintain  optimal edema control Date Initiated: 04/15/2023 Target Resolution Date: 06/17/2023 Goal Status: Active Interventions: Assess peripheral edema status every visit. Compression as ordered Treatment Activities: Therapeutic compression applied : 04/15/2023 Notes: Wound/Skin Impairment Nursing Diagnoses: Impaired tissue integrity Knowledge deficit related to ulceration/compromised skin integrity Goals: Patient/caregiver will verbalize understanding of skin care regimen Date Initiated: 04/15/2023 Target Resolution Date: 06/17/2023 Goal Status: Active Ulcer/skin breakdown will have a volume reduction of 30% by week 4 Date Initiated: 04/15/2023 Date Inactivated: 05/16/2023 Target Resolution Date: 05/13/2023 Unmet Reason: uncontrolled edema, Goal Status: Unmet fluid overload Ulcer/skin breakdown will have a volume reduction of 50% by week 8 Date Initiated: 05/16/2023 Target Resolution Date: 06/13/2023 Goal Status: Active Interventions: Assess patient/caregiver ability to obtain necessary supplies Assess patient/caregiver ability to perform ulcer/skin care regimen upon admission and as needed Assess ulceration(s) every visit Provide education on ulcer and skin care Treatment Activities: Skin care regimen initiated : 04/15/2023 Topical wound management initiated : 04/15/2023 Notes: Electronic Signature(s) Signed: 05/22/2023 5:11:25 PM By: Zenaida Deed RN, BSN Entered By: Zenaida Deed on 05/22/2023 15:38:02 [name] ([id number])  [id number] Page 7 of 15 -------------------------------------------------------------------------------- Pain Assessment Details Patient Name: Date of Service: [name], [name] 05/22/2023 2:45 PM Medical Record Number: [id number] Patient Account Number: [id number] Date of Birth/Sex: Treating RN: 17-Sep-1940 (82 y.o. F) Primary Care Vianna Venezia: Aliene Beams Other Clinician: Referring Anisha Starliper: Treating Keilan Nichol/Extender: Karna Dupes in Treatment: 5 Active Problems Location of Pain Severity and Description of Pain Patient Has Paino No Site Locations Rate the pain. Current Pain Level: 0 Worst Pain Level: 0 Least Pain Level: 0 Tolerable Pain Level: 0 Pain Management and Medication Current Pain Management: Electronic Signature(s) Signed: 05/22/2023 5:11:25 PM By: Zenaida Deed RN, BSN Entered By: Zenaida Deed on 05/22/2023 15:19:55 -------------------------------------------------------------------------------- Patient/Caregiver Education Details Patient Name: Date of Service: [name] 9/11/2024andnbsp2:45 PM Medical Record Number: [id number] Patient Account Number: [id number] Date of Birth/Gender: Treating RN: 1941/07/08 (82 y.o. [name] Primary Care Physician: Aliene Beams Other Clinician: Referring Physician: Treating Physician/Extender: Karna Dupes in Treatment: 5 Education Assessment Education Provided To: Patient Education Topics Provided Elevated Blood Sugar/ Impact on Healing: Methods: Explain/Verbal Responses: Reinforcements needed, State content correctly Venous: Methods: Explain/Verbal Responses: Reinforcements needed, State content correctly Caruthers, Bosie Clos ([id number]) [id number] Page 8 of 15 Electronic Signature(s) Signed: 05/22/2023 5:11:25 PM By: Zenaida Deed RN, BSN Entered By: Zenaida Deed on 05/22/2023  15:38:24 -------------------------------------------------------------------------------- Wound Assessment Details Patient Name: Date of Service: MILINDA, HOTTINGER 05/22/2023 2:45 PM Medical Record Number: [id number] Patient Account Number: [id number] Date of Birth/Sex: Treating RN: 1940-12-19 (82 y.o. Billy Coast, Bonita Quin Primary Care Makaveli Hoard: Aliene Beams Other Clinician: Referring Shloma Roggenkamp: Treating Floree Zuniga/Extender: Karna Dupes in Treatment: 5 Wound Status Wound Number: 10 Primary Lymphedema Etiology: Wound Location: Left, Medial Lower Leg Secondary Diabetic Wound/Ulcer of the Lower Extremity Wounding Event: Gradually Appeared Etiology: Date Acquired: 03/25/2023 Wound Open Weeks Of Treatment: 5 Status: Clustered Wound: No Comorbid Lymphedema, Congestive Heart Failure, Hypertension, Peripheral History: Venous Disease, Type II Diabetes, Osteoarthritis, Neuropathy, Confinement Anxiety Photos Wound Measurements Length: (cm) 3.5 Width: (cm) 2.1 Depth: (cm) 0.1 Area: (cm) 5.773 Volume: (cm) 0.577 % Reduction in Area: -8031% % Reduction in Volume: -8142.9% Epithelialization: Large (67-100%) Tunneling: No Undermining: No Wound Description Classification: Full Thickness Without Exposed Suppor Wound Margin: Indistinct, nonvisible Exudate Amount: Medium Exudate Type: Serous Exudate Color: amber t Structures Foul Odor After Cleansing: No Slough/Fibrino No Wound Bed Granulation Amount: Large (67-100%) Exposed Structure Granulation Quality: Red Fascia Exposed:  No Necrotic Amount: None Present (0%) Fat Layer (Subcutaneous Tissue) Exposed: Yes Tendon Exposed: No Muscle Exposed: No Joint Exposed: No Bone Exposed: No Periwound Skin Texture Texture Color No Abnormalities Noted: Yes No Abnormalities Noted: No Hemosiderin Staining: Yes Moisture No Abnormalities Noted: Yes Temperature / Pain Ellers, Babbie ([id number])  [id number] Page 9 of 15 Temperature: No Abnormality Treatment Notes Wound #10 (Lower Leg) Wound Laterality: Left, Medial Cleanser Peri-Wound Care Sween Lotion (Moisturizing lotion) Discharge Instruction: Apply moisturizing lotion as directed Topical Primary Dressing Maxorb Extra Ag+ Alginate Dressing, 4x4.75 (in/in) Discharge Instruction: Apply to wound bed as instructed Secondary Dressing ABD Pad, 8x10 Discharge Instruction: Apply over primary dressing as directed. Secured With Compression Wrap Urgo K2 Lite, (equivalent to a 3 layer) two layer compression system, regular Discharge Instruction: Apply Urgo K2 Lite as directed (alternative to 3 layer compression). Compression Stockings Add-Ons Electronic Signature(s) Signed: 05/22/2023 5:11:25 PM By: Zenaida Deed RN, BSN Entered By: Zenaida Deed on 05/22/2023 15:30:53 -------------------------------------------------------------------------------- Wound Assessment Details Patient Name: Date of Service: [name], [name] 05/22/2023 2:45 PM Medical Record Number: [id number] Patient Account Number: [id number] Date of Birth/Sex: Treating RN: Sep 03, 1941 (82 y.o. [name] Primary Care Dalaina Tates: Aliene Beams Other Clinician: Referring Meaghen Vecchiarelli: Treating Amandy Chubbuck/Extender: Karna Dupes in Treatment: 5 Wound Status Wound Number: 11 Primary Lymphedema Etiology: Wound Location: Right, Lateral Lower Leg Secondary Diabetic Wound/Ulcer of the Lower Extremity Wounding Event: Gradually Appeared Etiology: Date Acquired: 03/25/2023 Wound Open Weeks Of Treatment: 5 Status: Clustered Wound: No Comorbid Lymphedema, Congestive Heart Failure, Hypertension, Peripheral History: Venous Disease, Type II Diabetes, Osteoarthritis, Neuropathy, Confinement Anxiety Photos JACYLN, PETHTEL ([id number]) [id number] Page 10 of 15 Wound Measurements Length: (cm)  3.5 Width: (cm) 4 Depth: (cm) 0.1 Area: (cm) 10.996 Volume: (cm) 1.1 % Reduction in Area: -238.1% % Reduction in Volume: -238.5% Epithelialization: Small (1-33%) Tunneling: No Undermining: No Wound Description Classification: Full Thickness Without Exposed Support Structures Wound Margin: Flat and Intact Exudate Amount: Medium Exudate Type: Serous Exudate Color: amber Foul Odor After Cleansing: No Slough/Fibrino No Wound Bed Granulation Amount: Large (67-100%) Exposed Structure Granulation Quality: Red, Hyper-granulation Fascia Exposed: No Necrotic Amount: None Present (0%) Fat Layer (Subcutaneous Tissue) Exposed: Yes Tendon Exposed: No Muscle Exposed: No Joint Exposed: No Bone Exposed: No Periwound Skin Texture Texture Color No Abnormalities Noted: Yes No Abnormalities Noted: No Hemosiderin Staining: Yes Moisture No Abnormalities Noted: Yes Temperature / Pain Temperature: No Abnormality Treatment Notes Wound #11 (Lower Leg) Wound Laterality: Right, Lateral Cleanser Peri-Wound Care Sween Lotion (Moisturizing lotion) Discharge Instruction: Apply moisturizing lotion as directed Topical Primary Dressing Maxorb Extra Ag+ Alginate Dressing, 4x4.75 (in/in) Discharge Instruction: Apply to wound bed as instructed Secondary Dressing ABD Pad, 8x10 Discharge Instruction: Apply over primary dressing as directed. Secured With Compression Wrap Urgo K2 Lite, (equivalent to a 3 layer) two layer compression system, regular Discharge Instruction: Apply Urgo K2 Lite as directed (alternative to 3 layer compression). Compression Stockings Add-Ons Electronic Signature(s) Signed: 05/22/2023 5:11:25 PM By: Zenaida Deed RN, BSN Lacher, JUDITH9/07/2023 5:11:25 PM By: Zenaida Deed RN, BSN Signed: ([id number]) [id number] Page 11 of 15 Entered By: Zenaida Deed on 05/22/2023  15:31:35 -------------------------------------------------------------------------------- Wound Assessment Details Patient Name: Date of Service: [name], [name] 05/22/2023 2:45 PM Medical Record Number: [id number] Patient Account Number: [id number] Date of Birth/Sex: Treating RN: 09-Nov-1940 (82 y.o. [name] Primary Care Purvis Sidle: Aliene Beams Other Clinician: Referring Texie Tupou: Treating Mackenzee Becvar/Extender: Karna Dupes in Treatment: 5 Wound Status Wound Number: 12 Primary Lymphedema Etiology: Wound Location:  Left, Anterior Lower Leg Secondary Diabetic Wound/Ulcer of the Lower Extremity Wounding Event: Gradually Appeared Etiology: Date Acquired: 05/07/2023 Wound Open Weeks Of Treatment: 2 Status: Clustered Wound: No Comorbid Lymphedema, Congestive Heart Failure, Hypertension, Peripheral History: Venous Disease, Type II Diabetes, Osteoarthritis, Neuropathy, Confinement Anxiety Photos Wound Measurements Length: (cm) 3.4 Width: (cm) 3.5 Depth: (cm) 0.1 Area: (cm) 9.346 Volume: (cm) 0.935 % Reduction in Area: 9.8% % Reduction in Volume: 9.8% Epithelialization: Large (67-100%) Tunneling: No Undermining: No Wound Description Classification: Full Thickness Without Exposed Suppo Wound Margin: Indistinct, nonvisible Exudate Amount: Medium Exudate Type: Serous Exudate Color: amber rt Structures Foul Odor After Cleansing: No Slough/Fibrino No Wound Bed Granulation Amount: Large (67-100%) Exposed Structure Granulation Quality: Red Fascia Exposed: No Necrotic Amount: None Present (0%) Fat Layer (Subcutaneous Tissue) Exposed: Yes Tendon Exposed: No Muscle Exposed: No Joint Exposed: No Bone Exposed: No Periwound Skin Texture Texture Color No Abnormalities Noted: Yes No Abnormalities Noted: No Hemosiderin Staining: Yes Moisture No Abnormalities Noted: Yes Temperature / Pain Temperature: No Abnormality Magnan, Maebell ([id number])  [id number] Page 12 of 15 Treatment Notes Wound #12 (Lower Leg) Wound Laterality: Left, Anterior Cleanser Peri-Wound Care Zinc Oxide Ointment 30g tube Discharge Instruction: Apply Zinc Oxide to periwound with each dressing change Sween Lotion (Moisturizing lotion) Discharge Instruction: Apply moisturizing lotion as directed Topical Primary Dressing Maxorb Extra Ag+ Alginate Dressing, 4x4.75 (in/in) Discharge Instruction: Apply to wound bed as instructed Secondary Dressing ABD Pad, 8x10 Discharge Instruction: Apply over primary dressing as directed. Zetuvit Plus 4x8 in Discharge Instruction: Apply over primary dressing as directed. Secured With Compression Wrap Urgo K2 Lite, (equivalent to a 3 layer) two layer compression system, regular Discharge Instruction: Apply Urgo K2 Lite as directed (alternative to 3 layer compression). Compression Stockings Add-Ons Electronic Signature(s) Signed: 05/22/2023 5:11:25 PM By: Zenaida Deed RN, BSN Entered By: Zenaida Deed on 05/22/2023 15:32:08 -------------------------------------------------------------------------------- Wound Assessment Details Patient Name: Date of Service: [name], [name] 05/22/2023 2:45 PM Medical Record Number: [id number] Patient Account Number: [id number] Date of Birth/Sex: Treating RN: [birth year] (82 y.o. [name] Primary Care Santiana Glidden: Aliene Beams Other Clinician: Referring Fabiola Mudgett: Treating Eluzer Howdeshell/Extender: Karna Dupes in Treatment: 5 Wound Status Wound Number: 13 Primary Lymphedema Etiology: Wound Location: Left, Posterior Lower Leg Wound Open Wounding Event: Gradually Appeared Status: Date Acquired: 05/22/2023 Comorbid Lymphedema, Congestive Heart Failure, Hypertension, Peripheral Weeks Of Treatment: 0 History: Venous Disease, Type II Diabetes, Osteoarthritis, Neuropathy, Clustered Wound: No Confinement Anxiety Photos NIKIE, QADRI ([id number]) [id number] Page 13 of 15 Wound Measurements Length: (cm) 1.1 Width: (cm) 1.3 Depth: (cm) 0.1 Area: (cm) 1.123 Volume: (cm) 0.112 % Reduction in Area: % Reduction in Volume: Epithelialization: Small (1-33%) Tunneling: No Undermining: No Wound Description Classification: Full Thickness Without Exposed Support Structures Wound Margin: Flat and Intact Exudate Amount: Medium Exudate Type: Serous Exudate Color: amber Foul Odor After Cleansing: No Slough/Fibrino Yes Wound Bed Granulation Amount: Medium (34-66%) Exposed Structure Granulation Quality: Red, Hyper-granulation Fascia Exposed: No Necrotic Amount: Medium (34-66%) Fat Layer (Subcutaneous Tissue) Exposed: Yes Necrotic Quality: Adherent Slough Tendon Exposed: No Muscle Exposed: No Joint Exposed: No Bone Exposed: No Periwound Skin Texture Texture Color No Abnormalities Noted: Yes No Abnormalities Noted: No Hemosiderin Staining: Yes Moisture No Abnormalities Noted: Yes Temperature / Pain Temperature: No Abnormality Treatment Notes Wound #13 (Lower Leg) Wound Laterality: Left, Posterior Cleanser Peri-Wound Care Sween Lotion (Moisturizing lotion) Discharge Instruction: Apply moisturizing lotion as directed Topical Primary Dressing Maxorb Extra Ag+ Alginate Dressing, 4x4.75 (in/in) Discharge Instruction: Apply to wound bed as  instructed Secondary Dressing ABD Pad, 8x10 Discharge Instruction: Apply over primary dressing as directed. Secured With Compression Wrap Urgo K2 Lite, (equivalent to a 3 layer) two layer compression system, regular Discharge Instruction: Apply Urgo K2 Lite as directed (alternative to 3 layer compression). Compression Stockings Add-Ons Electronic Signature(s) Signed: 05/22/2023 5:11:25 PM By: Zenaida Deed RN, BSN Berti, JUDITH9/07/2023 5:11:25 PM By: Zenaida Deed RN, BSN Signed: ([id number]) [id number] Page 14 of  15 Entered By: Zenaida Deed on 05/22/2023 15:32:48 -------------------------------------------------------------------------------- Wound Assessment Details Patient Name: Date of Service: [name], SANANTONIO 05/22/2023 2:45 PM Medical Record Number: [id number] Patient Account Number: [id number] Date of Birth/Sex: Treating RN: 09-04-1941 (82 y.o. Billy Coast, Bonita Quin Primary Care Kymberlyn Eckford: Aliene Beams Other Clinician: Referring Shandee Jergens: Treating Marty Uy/Extender: Karna Dupes in Treatment: 5 Wound Status Wound Number: 14 Primary Lymphedema Etiology: Wound Location: Left, Lateral Lower Leg Wound Open Wounding Event: Gradually Appeared Status: Date Acquired: 05/22/2023 Comorbid Lymphedema, Congestive Heart Failure, Hypertension, Peripheral Weeks Of Treatment: 0 History: Venous Disease, Type II Diabetes, Osteoarthritis, Neuropathy, Clustered Wound: No Confinement Anxiety Wound Measurements Length: (cm) Width: (cm) Depth: (cm) Area: (cm) Volume: (cm) 0.9 % Reduction in Area: 1.5 % Reduction in Volume: 0.1 Epithelialization: Medium (34-66%) 1.06 Tunneling: No 0.106 Undermining: No Wound Description Classification: Full Thickness Without Exposed Suppo Wound Margin: Flat and Intact Exudate Amount: Small Exudate Type: Serous Exudate Color: amber rt Structures Foul Odor After Cleansing: No Slough/Fibrino Yes Wound Bed Granulation Amount: Large (67-100%) Exposed Structure Granulation Quality: Red Fascia Exposed: No Necrotic Amount: Small (1-33%) Fat Layer (Subcutaneous Tissue) Exposed: Yes Necrotic Quality: Adherent Slough Tendon Exposed: No Muscle Exposed: No Joint Exposed: No Bone Exposed: No Periwound Skin Texture Texture Color No Abnormalities Noted: Yes No Abnormalities Noted: No Hemosiderin Staining: Yes Moisture No Abnormalities Noted: Yes Temperature / Pain Temperature: No Abnormality Treatment Notes Wound #14 (Lower Leg) Wound  Laterality: Left, Lateral Cleanser Peri-Wound Care Sween Lotion (Moisturizing lotion) Discharge Instruction: Apply moisturizing lotion as directed Topical Primary Dressing Maxorb Extra Ag+ Alginate Dressing, 4x4.75 (in/in) Discharge Instruction: Apply to wound bed as instructed Belcastro, Latrell ([id number]) [id number] Page 15 of 15 Secondary Dressing ABD Pad, 8x10 Discharge Instruction: Apply over primary dressing as directed. Secured With Compression Wrap Urgo K2 Lite, (equivalent to a 3 layer) two layer compression system, regular Discharge Instruction: Apply Urgo K2 Lite as directed (alternative to 3 layer compression). Compression Stockings Add-Ons Electronic Signature(s) Signed: 05/22/2023 5:11:25 PM By: Zenaida Deed RN, BSN Entered By: Zenaida Deed on 05/22/2023 15:49:30 -------------------------------------------------------------------------------- Vitals Details Patient Name: Date of Service: KYANI, AGIUS 05/22/2023 2:45 PM Medical Record Number: [id number] Patient Account Number: [id number] Date of Birth/Sex: Treating RN: [birth year] (82 y.o. F) Primary Care Liberato Stansbery: Aliene Beams Other Clinician: Referring Yaphet Smethurst: Treating Beckam Abdulaziz/Extender: Karna Dupes in Treatment: 5 Vital Signs Time Taken: 03:02 Temperature (F): 98.5 Height (in): 63 Pulse (bpm): 84 Weight (lbs): 209 Respiratory Rate (breaths/min): 18 Body Mass Index (BMI): 37 Blood Pressure (mmHg): 168/82 Capillary Blood Glucose (mg/dl): 160 Reference Range: 80 - 120 mg / dl Notes glucose per pt report this am Electronic Signature(s) Signed: 05/22/2023 5:11:25 PM By: Zenaida Deed RN, BSN Entered By: Zenaida Deed on 05/22/2023 15:19:32

## 2023-05-23 NOTE — Progress Notes (Signed)
[name], [name] ([id number]) [id number] Page 1 of 14 Visit Report for 05/22/2023 Chief Complaint Document Details Patient Name: Date of Service: [name], [name] 05/22/2023 2:45 PM Medical Record Number: [id number] Patient Account Number: [id number] Date of Birth/Sex: Treating RN: [birth year] (82 y.o. F) Primary Care Provider: Aliene Beams Other Clinician: Referring Provider: Treating Provider/Extender: Karna Dupes in Treatment: 5 Information Obtained from: Patient Chief Complaint 03/28/2020; patient is here for review of wounds on her bilateral lower legs 12/25/2022; bilateral lower extremity wound 04/15/2023: BLE wounds Electronic Signature(s) Signed: 05/22/2023 4:14:02 PM By: Duanne Guess MD FACS Entered By: Duanne Guess on 05/22/2023 16:14:01 -------------------------------------------------------------------------------- Debridement Details Patient Name: Date of Service: [name], [name] 05/22/2023 2:45 PM Medical Record Number: [id number] Patient Account Number: [id number] Date of Birth/Sex: Treating RN: Sep 08, 1941 (82 y.o. [name] Primary Care Provider: Aliene Beams Other Clinician: Referring Provider: Treating Provider/Extender: Karna Dupes in Treatment: 5 Debridement Performed for Assessment: Wound #13 Left,Posterior Lower Leg Performed By: Physician Duanne Guess, MD The following information was scribed by: Zenaida Deed The information was scribed for: Duanne Guess Debridement Type: Debridement Level of Consciousness (Pre-procedure): Awake and Alert Pre-procedure Verification/Time Out Yes - 15:45 Taken: Start Time: 15:45 Percent of Wound Bed Debrided: 100% T Area Debrided (cm): otal 1.12 Tissue and other material debrided: Non-Viable, Slough, Slough Level: Non-Viable Tissue Debridement Description: Selective/Open Wound Instrument: Curette Bleeding:  Minimum Hemostasis Achieved: Pressure Procedural Pain: 0 Post Procedural Pain: 0 Response to Treatment: Procedure was tolerated well Level of Consciousness (Post- Awake and Alert procedure): Post Debridement Measurements of Total Wound Length: (cm) 1.1 Width: (cm) 1.3 Depth: (cm) 0.1 Volume: (cm) 0.112 [name], [name] ([id number]) [id number] Page 2 of 14 Character of Wound/Ulcer Post Debridement: Improved Post Procedure Diagnosis Same as Pre-procedure Electronic Signature(s) Signed: 05/22/2023 5:11:25 PM By: Zenaida Deed RN, BSN Signed: 05/22/2023 5:39:35 PM By: Duanne Guess MD FACS Entered By: Zenaida Deed on 05/22/2023 15:47:17 -------------------------------------------------------------------------------- Debridement Details Patient Name: Date of Service: [name], [name] 05/22/2023 2:45 PM Medical Record Number: [id number] Patient Account Number: [id number] Date of Birth/Sex: Treating RN: 1941-07-12 (82 y.o. [name] Primary Care Provider: Aliene Beams Other Clinician: Referring Provider: Treating Provider/Extender: Karna Dupes in Treatment: 5 Debridement Performed for Assessment: Wound #14 Left,Lateral Lower Leg Performed By: Physician Duanne Guess, MD The following information was scribed by: Zenaida Deed The information was scribed for: Duanne Guess Debridement Type: Debridement Level of Consciousness (Pre-procedure): Awake and Alert Pre-procedure Verification/Time Out Yes - 15:45 Taken: Start Time: 15:45 Percent of Wound Bed Debrided: 100% T Area Debrided (cm): otal 1.06 Tissue and other material debrided: Non-Viable, Eschar, Slough, Slough Level: Non-Viable Tissue Debridement Description: Selective/Open Wound Instrument: Curette Bleeding: Minimum Hemostasis Achieved: Pressure Procedural Pain: 0 Post Procedural Pain: 0 Response to Treatment: Procedure was tolerated well Level  of Consciousness (Post- Awake and Alert procedure): Post Debridement Measurements of Total Wound Length: (cm) 0.9 Width: (cm) 1.5 Depth: (cm) 0.1 Volume: (cm) 0.106 Character of Wound/Ulcer Post Debridement: Improved Post Procedure Diagnosis Same as Pre-procedure Electronic Signature(s) Signed: 05/22/2023 5:11:25 PM By: Zenaida Deed RN, BSN Signed: 05/22/2023 5:39:35 PM By: Duanne Guess MD FACS Entered By: Zenaida Deed on 05/22/2023 15:50:49 HPI Details -------------------------------------------------------------------------------- [name] ([id number]) [id number] Page 3 of 14 Patient Name: Date of Service: [name], [name] 05/22/2023 2:45 PM Medical Record Number: [id number] Patient Account Number: [id number] Date of Birth/Sex: Treating RN: 07-26-1941 (82 y.o. F) Primary Care Provider: Aliene Beams Other Clinician: Referring Provider: Treating Provider/Extender: Lady Gary  Kathee Delton, Fleet Contras Weeks in Treatment: 5 History of Present Illness HPI Description: ADMISSION 03/28/2020 This is a 82 year old woman who is accompanied by her daughter. She is moving to [place] from [place] where she lives. Infectious been going to the wound care center in [place] for the last 2 months. She apparently did not tolerate compression early on in that clinic stay. She is just using dry gauze over the wounds when she came in today. She tells me she has had both arterial and venous reflux studies although she does not have copies of these. She was apparently offered an ablation but I have no further information on that. She also is a type II diabetic. She was noted to have wounds on her lower extremities during a CHF clinic visit with Dr. Gala Romney and she was referred here. She has several scattered areas on the right medial lower leg and a large area of superficial ulceration on the posterior medial left lower leg. past medical history;  chronic kidney disease stage III, gastroesophageal reflux disease, type 2 diabetes with neuropathy, chronic diastolic heart failure, obstructive sleep apnea, hypertension, restless leg syndrome, pulmonary venous hypertension, history of colon CA, pacemaker and apparently venous reflux. We did not do arterial studies in the clinic today we are going to try to get the results that were already done within the last 2 months in [place] 7/26; patient readmitted to the clinic last week with predominantly chronic venous wounds almost circumferentially in the left lower leg and the right leg medially. She also has secondary lymphedema. We put her in compression. She went to the ER in [place] on Friday they remove the wrap on the left leg diagnosed her with cellulitis and put her on doxycycline. They apparently also did a ultrasound that was negative for DVT . We did not get any vascular information from [facility] where she apparently had arterial studies and venous studies. She is currently moving from [place] to [place] is up on her feet quite a bit. She was referred to vein and vascular here but never got to the appointment. ABIs were obtained here at 0.89 on the right and 1.02 on the left 8/9-Patient returns after establishing in clinic last visit, we have been doing 3 layer compression on both sides with calcium alginate, she is following up with the vein and vascular when she gets an appointment. The right side is healed the left side is still got some small open areas She has appt on Wed at Vein and vascular at which point she will come in for Nurse visit here 8/16; the patient arrives with all of her wounds closed. She has bilateral Juzo stockings. She saw Dr. Darrick Penna on 8/11 to review her reflux studies from the same day. On the right she had no evidence of DVT or SVT She did have deep venous reflux in the common femoral vein as well as superficial venous reflux in the . small saphenous  vein of the mid calf on the left again no thrombus in the deep vein or superficial veins. Deep vein reflux in the common femoral vein as well as the great saphenous vein. She saw Dr. Darrick Penna in consult. Also of note he noted that she had a normal arterial scan done in [place] in April 2021, we were never able to obtain this. His feeling was that the patient did have a dilated greater saphenous vein however minimal to no evidence of reflux in her superficial venous system. She also was felt  to have mild deep vein reflux and some degree of lymphedema. He recommended continued compression of the lower extremities with intermittent Unna boots if necessary. She was told to elevate her legs. If she develops recurrent ulcerations they were not opposed to revisiting the repeat venous reflux exam to see if she develops worsening reflux for consideration of laser ablation READMISSION 07/26/2020 Patient is now an 82 year old woman who lives in [place]. We had her in the clinic in the summer with wounds on her bilateral lower legs secondary to chronic venous insufficiency with some degree of lymphedema. She was seen by Dr. Darrick Penna in August. This was predominantly to review her venous reflux. She quoted normal arterial studies done in [place] in April 2021 but I have never been able to see these myself.Marland Kitchen His feeling was the patient did have a bilateral great dilated greater saphenous vein however minimal to no evidence of reflux in her superficial venous system. She was also felt to have mild deep vein reflux and some degree of lymphedema. He recommended compression and leg elevation.Marland Kitchen He was not opposed to repeating her venous reflux studies at some point if she develops worse to see if she develops worsening reflux for consideration of ablation. We discharged the patient with juxta lite stockings bilaterally and she has been compliant with wearing these. Unfortunately in September she had a fall suffering a  right tibial fracture requiring an IM nail with a rod. She also had fibular fractures in at least one place. She was sent to skilled facility for rehabilitation she is now back at home. She had a DVT rule out study on 9/24 that was negative for DVT . She tells [place] that in the last 2 or 3 weeks she is developed blisters on her right lower extremity that ruptured into 2 open wounds. She has not been able to get these to close over. She religiously uses her juxta lite stockings on both legs and she gets them on is tight as she can. Originally the left leg was the larger of the 2 legs but since the patient had surgery the right leg is more swollen and tense than the left. She also tells me she had a second fall yesterday and has had pain in the left dorsal midfoot. She has diabetic neuropathy with Charcot foot left greater than right. Past medical history includes chronic kidney disease, hypertension, type 2 diabetes with peripheral neuropathy, congestive heart failure, hypertension, history of colon CA, lumbar stenosis ABIs in our clinic were noncompressible bilaterally 11/30; the wounds in the right lower extremity are healed anteriorly. She has both Tubigrip and her juxta lite stockings she uses the Tubigrip as the primary layer. We went over this with her today she has the bilateral stockings. She will need to adjust her compression to roughly 30 mmHg 12/25/2022 Mrs. Anadelia Maxham is a 82 year old female with a past medical history Of CKD stage IV, lymphedema/venous insufficiency, controlled type 2 diabetes that presents the clinic for a 51-month history of bilateral lower extremity wounds. She has difficulty controlling the edema and is currently on metolazone 2.5 mg once weekly and torsemide 40 mg daily. She sleeps in a recliner. She has been using antibiotic ointment to the wound beds. She states she has Juxta lite compressions which she uses daily. She currently denies signs of infection. 4/23;  patient presents for follow-up. We have been using Santyl and Hydrofera Blue under Kerlix/Coban to the lower extremities bilaterally. She tolerated this well. 4/30; patient presents for  follow-up. We have been using silver alginate under 3 layer compression. Patient did not tolerate the wraps and took these off last week. She has significant swelling on exam. She does report that her cardiologist recommended increasing the diuretic regimen at one point to help with her lower extremity edema however she did not do this. SKYELYN, BILLINGSLEA ([id number]) [id number] Page 4 of 14 5/6; we have been using silver alginate and Coflex Unna boot. She was able to tolerate this. She does not tolerate tight compression well. She has wounds on her bilateral lower extremities some of which are measuring smaller the area on the left medial is unchanged. The patient sleeps in her recliner tells me that she is not able to elevate her legs much because of musculoskeletal pain. She has a history of having external compression stockings but they are old and will probably need a new pair she asked [place] to order them for her room ALT I told her we do so when she is close to healing 5/21; patient presents for follow-up. She missed her last clinic appointment due to not feeling well. She took off the compression wraps and has been keeping the areas covered and using compression stockings. She did tolerate the 3 layer compression wrap well when placed during the nurse visit on 5/7. She has a new wound to the lateral left leg. She denies signs of infection. READMISSION 04/15/2023: She returns to clinic today with new bilateral lower extremity wounds. She says that her legs started weeping and her daughter applied Neosporin, Kerlix and Coban. This seemed to help the swelling tremendously but she still has small open areas on her legs. Her Fabian November wraps are old and stretched out and not providing adequate  compression at this time. 05/07/2023: She has only had RN visits the past 2 weeks due to clinic scheduling issues. Her legs are markedly edematous and she has a new ulcer that has opened up on the left anterior lower leg. 05/16/2023: All of her wounds have gotten larger this week despite 3 layer compression equivalent. Edema control is extremely poor. She is audibly fluid overloaded, based upon her breathing. 05/22/2023: Her wounds have improved this week. There are large areas of epithelialization. She does have a new wound on the back of her left leg and one near the ankle on the lateral lower left leg. Her breathing is better and her edema control has improved. Electronic Signature(s) Signed: 05/22/2023 4:15:07 PM By: Duanne Guess MD FACS Entered By: Duanne Guess on 05/22/2023 16:15:07 -------------------------------------------------------------------------------- Physical Exam Details Patient Name: Date of Service: [name], RYLEE 05/22/2023 2:45 PM Medical Record Number: [id number] Patient Account Number: [id number] Date of Birth/Sex: Treating RN: 07/12/1941 (82 y.o. F) Primary Care Provider: Aliene Beams Other Clinician: Referring Provider: Treating Provider/Extender: Karna Dupes in Treatment: 5 Constitutional Hypertensive, asymptomatic. . . . no acute distress. Respiratory Normal work of breathing on room air. Notes 05/22/2023: Her wounds have improved this week. There are large areas of epithelialization. She does have a new wound on the back of her left leg and one near the ankle on the lateral lower left leg. Her breathing is better and her edema control has improved. Electronic Signature(s) Signed: 05/22/2023 4:24:41 PM By: Duanne Guess MD FACS Previous Signature: 05/22/2023 4:15:48 PM Version By: Duanne Guess MD FACS Entered By: Duanne Guess on 05/22/2023  16:24:41 -------------------------------------------------------------------------------- Physician Orders Details Patient Name: Date of Service: KHAMONI, LEVENE 05/22/2023 2:45 PM Medical Record Number: [id number] Patient Account Number:  [id number] Date of Birth/Sex: Treating RN: 11-26-1940 (82 y.o. [name] Primary Care Provider: Aliene Beams Other Clinician: Referring Provider: Treating Provider/Extender: Karna Dupes in Treatment: 5 The following information was scribed by: Alanda Slim, Bosie Clos ([id number]) [id number] Page 5 of 14 The information was scribed for: Duanne Guess Verbal / Phone Orders: No Diagnosis Coding ICD-10 Coding Code Description L97.811 Non-pressure chronic ulcer of other part of right lower leg limited to breakdown of skin L97.821 Non-pressure chronic ulcer of other part of left lower leg limited to breakdown of skin L84 Corns and callosities I89.0 Lymphedema, not elsewhere classified I87.2 Venous insufficiency (chronic) (peripheral) E11.622 Type 2 diabetes mellitus with other skin ulcer I50.9 Heart failure, unspecified N18.4 Chronic kidney disease, stage 4 (severe) Follow-up Appointments ppointment in 1 week. - Dr. Lady Gary RM 3 Return A Wed 9/18 @ 2:00 pm Bathing/ Shower/ Hygiene May shower with protection but do not get wound dressing(s) wet. Protect dressing(s) with water repellant cover (for example, large plastic bag) or a cast cover and may then take shower. Edema Control - Lymphedema / SCD / Other Elevate legs to the level of the heart or above for 30 minutes daily and/or when sitting for 3-4 times a day throughout the day. Avoid standing for long periods of time. Exercise regularly Wound Treatment Wound #10 - Lower Leg Wound Laterality: Left, Medial Peri-Wound Care: Sween Lotion (Moisturizing lotion) 1 x Per Week/30 Days Discharge Instructions: Apply moisturizing  lotion as directed Prim Dressing: Maxorb Extra Ag+ Alginate Dressing, 4x4.75 (in/in) 1 x Per Week/30 Days ary Discharge Instructions: Apply to wound bed as instructed Secondary Dressing: ABD Pad, 8x10 1 x Per Week/30 Days Discharge Instructions: Apply over primary dressing as directed. Compression Wrap: Urgo K2 Lite, (equivalent to a 3 layer) two layer compression system, regular 1 x Per Week/30 Days Discharge Instructions: Apply Urgo K2 Lite as directed (alternative to 3 layer compression). Wound #11 - Lower Leg Wound Laterality: Right, Lateral Peri-Wound Care: Sween Lotion (Moisturizing lotion) 1 x Per Week/30 Days Discharge Instructions: Apply moisturizing lotion as directed Prim Dressing: Maxorb Extra Ag+ Alginate Dressing, 4x4.75 (in/in) 1 x Per Week/30 Days ary Discharge Instructions: Apply to wound bed as instructed Secondary Dressing: ABD Pad, 8x10 1 x Per Week/30 Days Discharge Instructions: Apply over primary dressing as directed. Compression Wrap: Urgo K2 Lite, (equivalent to a 3 layer) two layer compression system, regular 1 x Per Week/30 Days Discharge Instructions: Apply Urgo K2 Lite as directed (alternative to 3 layer compression). Wound #12 - Lower Leg Wound Laterality: Left, Anterior Peri-Wound Care: Zinc Oxide Ointment 30g tube 1 x Per Week/30 Days Discharge Instructions: Apply Zinc Oxide to periwound with each dressing change Peri-Wound Care: Sween Lotion (Moisturizing lotion) 1 x Per Week/30 Days Discharge Instructions: Apply moisturizing lotion as directed Prim Dressing: Maxorb Extra Ag+ Alginate Dressing, 4x4.75 (in/in) 1 x Per Week/30 Days ary Discharge Instructions: Apply to wound bed as instructed Secondary Dressing: ABD Pad, 8x10 1 x Per Week/30 Days Discharge Instructions: Apply over primary dressing as directed. Secondary Dressing: Zetuvit Plus 4x8 in 1 x Per Week/30 Days Discharge Instructions: Apply over primary dressing as directed. Compression Wrap: Urgo  K2 Lite, (equivalent to a 3 layer) two layer compression system, regular 1 x Per Week/30 Days Discharge Instructions: Apply Urgo K2 Lite as directed (alternative to 3 layer compression). CHELSE, GUNNER ([id number]) [id number] Page 6 of 14 Wound #13 - Lower Leg Wound Laterality: Left, Posterior Peri-Wound Care: Sween Lotion (Moisturizing lotion) 1 x Per  Week/30 Days Discharge Instructions: Apply moisturizing lotion as directed Prim Dressing: Maxorb Extra Ag+ Alginate Dressing, 4x4.75 (in/in) 1 x Per Week/30 Days ary Discharge Instructions: Apply to wound bed as instructed Secondary Dressing: ABD Pad, 8x10 1 x Per Week/30 Days Discharge Instructions: Apply over primary dressing as directed. Compression Wrap: Urgo K2 Lite, (equivalent to a 3 layer) two layer compression system, regular 1 x Per Week/30 Days Discharge Instructions: Apply Urgo K2 Lite as directed (alternative to 3 layer compression). Wound #14 - Lower Leg Wound Laterality: Left, Lateral Peri-Wound Care: Sween Lotion (Moisturizing lotion) 1 x Per Week/30 Days Discharge Instructions: Apply moisturizing lotion as directed Prim Dressing: Maxorb Extra Ag+ Alginate Dressing, 4x4.75 (in/in) 1 x Per Week/30 Days ary Discharge Instructions: Apply to wound bed as instructed Secondary Dressing: ABD Pad, 8x10 1 x Per Week/30 Days Discharge Instructions: Apply over primary dressing as directed. Compression Wrap: Urgo K2 Lite, (equivalent to a 3 layer) two layer compression system, regular 1 x Per Week/30 Days Discharge Instructions: Apply Urgo K2 Lite as directed (alternative to 3 layer compression). Electronic Signature(s) Signed: 05/22/2023 5:39:35 PM By: Duanne Guess MD FACS Entered By: Duanne Guess on 05/22/2023 16:27:08 -------------------------------------------------------------------------------- Problem List Details Patient Name: Date of Service: [name], [name] 05/22/2023 2:45 PM Medical Record  Number: [id number] Patient Account Number: [id number] Date of Birth/Sex: Treating RN: November 25, 1940 (82 y.o. Billy Coast, Linda Primary Care Provider: Aliene Beams Other Clinician: Referring Provider: Treating Provider/Extender: Karna Dupes in Treatment: 5 Active Problems ICD-10 Encounter Code Description Active Date MDM Diagnosis L97.811 Non-pressure chronic ulcer of other part of right lower leg limited to breakdown 04/15/2023 No Yes of skin L97.821 Non-pressure chronic ulcer of other part of left lower leg limited to breakdown 04/15/2023 No Yes of skin L84 Corns and callosities 05/16/2023 No Yes I89.0 Lymphedema, not elsewhere classified 04/15/2023 No Yes I87.2 Venous insufficiency (chronic) (peripheral) 04/15/2023 No Yes Kaleta, Jillene ([id number]) [phone].pdf Page 7 of 14 E11.622 Type 2 diabetes mellitus with other skin ulcer 04/15/2023 No Yes I50.9 Heart failure, unspecified 04/15/2023 No Yes N18.4 Chronic kidney disease, stage 4 (severe) 04/15/2023 No Yes Inactive Problems Resolved Problems Electronic Signature(s) Signed: 05/22/2023 4:11:37 PM By: Duanne Guess MD FACS Entered By: Duanne Guess on 05/22/2023 16:11:37 -------------------------------------------------------------------------------- Progress Note Details Patient Name: Date of Service: RIGBY, GROVE 05/22/2023 2:45 PM Medical Record Number: [id number] Patient Account Number: [id number] Date of Birth/Sex: Treating RN: [birth year] (82 y.o. F) Primary Care Provider: Aliene Beams Other Clinician: Referring Provider: Treating Provider/Extender: Karna Dupes in Treatment: 5 Subjective Chief Complaint Information obtained from Patient 03/28/2020; patient is here for review of wounds on her bilateral lower legs 12/25/2022; bilateral lower extremity wound 04/15/2023: BLE wounds History of Present Illness (HPI) ADMISSION 03/28/2020 This is a  82 year old woman who is accompanied by her daughter. She is moving to [place] from [place] where she lives. Infectious been going to the wound care center in [place] for the last 2 months. She apparently did not tolerate compression early on in that clinic stay. She is just using dry gauze over the wounds when she came in today. She tells me she has had both arterial and venous reflux studies although she does not have copies of these. She was apparently offered an ablation but I have no further information on that. She also is a type II diabetic. She was noted to have wounds on her lower extremities during a CHF clinic visit with Dr. Gala Romney and she was referred here.  She has several scattered areas on the right medial lower leg and a large area of superficial ulceration on the posterior medial left lower leg. past medical history; chronic kidney disease stage III, gastroesophageal reflux disease, type 2 diabetes with neuropathy, chronic diastolic heart failure, obstructive sleep apnea, hypertension, restless leg syndrome, pulmonary venous hypertension, history of colon CA, pacemaker and apparently venous reflux. We did not do arterial studies in the clinic today we are going to try to get the results that were already done within the last 2 months in [place] 7/26; patient readmitted to the clinic last week with predominantly chronic venous wounds almost circumferentially in the left lower leg and the right leg medially. She also has secondary lymphedema. We put her in compression. She went to the ER in [place] on Friday they remove the wrap on the left leg diagnosed her with cellulitis and put her on doxycycline. They apparently also did a ultrasound that was negative for DVT . We did not get any vascular information from [facility] where she apparently had arterial studies and venous studies. She is currently moving from [place] to [place] is up on her feet quite a bit.  She was referred to vein and vascular here but never got to the appointment. ABIs were obtained here at 0.89 on the right and 1.02 on the left 8/9-Patient returns after establishing in clinic last visit, we have been doing 3 layer compression on both sides with calcium alginate, she is following up with the vein and vascular when she gets an appointment. The right side is healed the left side is still got some small open areas She has appt on Wed at Vein and vascular at which point she will come in for Nurse visit here 8/16; the patient arrives with all of her wounds closed. She has bilateral Juzo stockings. She saw Dr. Darrick Penna on 8/11 to review her reflux studies from the same day. On the right she had no evidence of DVT or SVT She did have deep venous reflux in the common femoral vein as well as superficial venous reflux in the . small saphenous vein of the mid calf on the left again no thrombus in the deep vein or superficial veins. Deep vein reflux in the common femoral vein as well as the great saphenous vein. She saw Dr. Darrick Penna in consult. Also of note he noted that she had a normal arterial scan done in [place] in April 2021, we were [place], Bosie Clos ([id number]) [id number] Page 8 of 14 never able to obtain this. His feeling was that the patient did have a dilated greater saphenous vein however minimal to no evidence of reflux in her superficial venous system. She also was felt to have mild deep vein reflux and some degree of lymphedema. He recommended continued compression of the lower extremities with intermittent Unna boots if necessary. She was told to elevate her legs. If she develops recurrent ulcerations they were not opposed to revisiting the repeat venous reflux exam to see if she develops worsening reflux for consideration of laser ablation READMISSION 07/26/2020 Patient is now an 82 year old woman who lives in [place]. We had her in the clinic in the  summer with wounds on her bilateral lower legs secondary to chronic venous insufficiency with some degree of lymphedema. She was seen by Dr. Darrick Penna in August. This was predominantly to review her venous reflux. She quoted normal arterial studies done in [place] in April 2021 but I have never been able to see these  myself.Marland Kitchen His feeling was the patient did have a bilateral great dilated greater saphenous vein however minimal to no evidence of reflux in her superficial venous system. She was also felt to have mild deep vein reflux and some degree of lymphedema. He recommended compression and leg elevation.Marland Kitchen He was not opposed to repeating her venous reflux studies at some point if she develops worse to see if she develops worsening reflux for consideration of ablation. We discharged the patient with juxta lite stockings bilaterally and she has been compliant with wearing these. Unfortunately in September she had a fall suffering a right tibial fracture requiring an IM nail with a rod. She also had fibular fractures in at least one place. She was sent to skilled facility for rehabilitation she is now back at home. She had a DVT rule out study on 9/24 that was negative for DVT . She tells [place] that in the last 2 or 3 weeks she is developed blisters on her right lower extremity that ruptured into 2 open wounds. She has not been able to get these to close over. She religiously uses her juxta lite stockings on both legs and she gets them on is tight as she can. Originally the left leg was the larger of the 2 legs but since the patient had surgery the right leg is more swollen and tense than the left. She also tells me she had a second fall yesterday and has had pain in the left dorsal midfoot. She has diabetic neuropathy with Charcot foot left greater than right. Past medical history includes chronic kidney disease, hypertension, type 2 diabetes with peripheral neuropathy, congestive heart failure, hypertension,  history of colon CA, lumbar stenosis ABIs in our clinic were noncompressible bilaterally 11/30; the wounds in the right lower extremity are healed anteriorly. She has both Tubigrip and her juxta lite stockings she uses the Tubigrip as the primary layer. We went over this with her today she has the bilateral stockings. She will need to adjust her compression to roughly 30 mmHg 12/25/2022 Mrs. Atlas Digangi is a 82 year old female with a past medical history Of CKD stage IV, lymphedema/venous insufficiency, controlled type 2 diabetes that presents the clinic for a 55-month history of bilateral lower extremity wounds. She has difficulty controlling the edema and is currently on metolazone 2.5 mg once weekly and torsemide 40 mg daily. She sleeps in a recliner. She has been using antibiotic ointment to the wound beds. She states she has Juxta lite compressions which she uses daily. She currently denies signs of infection. 4/23; patient presents for follow-up. We have been using Santyl and Hydrofera Blue under Kerlix/Coban to the lower extremities bilaterally. She tolerated this well. 4/30; patient presents for follow-up. We have been using silver alginate under 3 layer compression. Patient did not tolerate the wraps and took these off last week. She has significant swelling on exam. She does report that her cardiologist recommended increasing the diuretic regimen at one point to help with her lower extremity edema however she did not do this. 5/6; we have been using silver alginate and Coflex Unna boot. She was able to tolerate this. She does not tolerate tight compression well. She has wounds on her bilateral lower extremities some of which are measuring smaller the area on the left medial is unchanged. The patient sleeps in her recliner tells me that she is not able to elevate her legs much because of musculoskeletal pain. She has a history of having external compression stockings but they  are old and  will probably need a new pair she asked [place] to order them for her room ALT I told her we do so when she is close to healing 5/21; patient presents for follow-up. She missed her last clinic appointment due to not feeling well. She took off the compression wraps and has been keeping the areas covered and using compression stockings. She did tolerate the 3 layer compression wrap well when placed during the nurse visit on 5/7. She has a new wound to the lateral left leg. She denies signs of infection. READMISSION 04/15/2023: She returns to clinic today with new bilateral lower extremity wounds. She says that her legs started weeping and her daughter applied Neosporin, Kerlix and Coban. This seemed to help the swelling tremendously but she still has small open areas on her legs. Her Fabian November wraps are old and stretched out and not providing adequate compression at this time. 05/07/2023: She has only had RN visits the past 2 weeks due to clinic scheduling issues. Her legs are markedly edematous and she has a new ulcer that has opened up on the left anterior lower leg. 05/16/2023: All of her wounds have gotten larger this week despite 3 layer compression equivalent. Edema control is extremely poor. She is audibly fluid overloaded, based upon her breathing. 05/22/2023: Her wounds have improved this week. There are large areas of epithelialization. She does have a new wound on the back of her left leg and one near the ankle on the lateral lower left leg. Her breathing is better and her edema control has improved. Patient History Information obtained from Patient. Family History Diabetes - Siblings, Heart Disease - Mother, Hypertension - Mother, Kidney Disease - Siblings, No family history of Cancer, Hereditary Spherocytosis, Lung Disease, Seizures, Stroke, Thyroid Problems, Tuberculosis. Social History Never smoker, Marital Status - Widowed, Alcohol Use - Rarely, Drug Use - No History, Caffeine Use -  Daily. Medical History Eyes Denies history of Cataracts, Glaucoma, Optic Neuritis Ear/Nose/Mouth/Throat Denies history of Chronic sinus problems/congestion, Middle ear problems Hematologic/Lymphatic Patient has history of Lymphedema Denies history of Anemia, Hemophilia, Human Immunodeficiency Virus, Sickle Cell Disease Respiratory Denies history of Aspiration, Asthma, Chronic Obstructive Pulmonary Disease (COPD), Pneumothorax, Sleep Apnea, Tuberculosis Grosch, Geselle ([id number]) [id number] Page 9 of 14 Cardiovascular Patient has history of Congestive Heart Failure, Hypertension, Peripheral Venous Disease Denies history of Angina, Arrhythmia, Coronary Artery Disease, Deep Vein Thrombosis, Hypotension, Myocardial Infarction, Peripheral Arterial Disease, Phlebitis, Vasculitis Gastrointestinal Denies history of Cirrhosis , Colitis, Crohns, Hepatitis A, Hepatitis B, Hepatitis C Endocrine Patient has history of Type II Diabetes Denies history of Type I Diabetes Genitourinary Denies history of End Stage Renal Disease Immunological Denies history of Lupus Erythematosus, Raynauds, Scleroderma Integumentary (Skin) Denies history of History of Burn Musculoskeletal Patient has history of Osteoarthritis Neurologic Patient has history of Neuropathy Denies history of Dementia, Quadriplegia, Paraplegia, Seizure Disorder Oncologic Denies history of Received Chemotherapy, Received Radiation Psychiatric Patient has history of Confinement Anxiety Denies history of Anorexia/bulimia Hospitalization/Surgery History - tibia nail insertion right. - cardiac cath. - lumbar laminectomy. - pacemaker implanted. - appendectomy. - lumpectomy left breast. - DandC uterus. - partial nephrectomy. - tosillectomy. - tubal ligation. Medical A Surgical History Notes nd Constitutional Symptoms (General Health) obesity Cardiovascular dyslipidemia,  pacemaker Gastrointestinal GERD Endocrine thyroid nodule Genitourinary CKD stage 3 Musculoskeletal fibromyalgia, lumbar spinal stenosis Oncologic hx colon CA Objective Constitutional Hypertensive, asymptomatic. no acute distress. Vitals Time Taken: 3:02 AM, Height: 63 in, Weight: 209 lbs, BMI: 37, Temperature: 98.5 F,  Pulse: 84 bpm, Respiratory Rate: 18 breaths/min, Blood Pressure: 168/82 mmHg, Capillary Blood Glucose: 128 mg/dl. General Notes: glucose per pt report this am Respiratory Normal work of breathing on room air. General Notes: 05/22/2023: Her wounds have improved this week. There are large areas of epithelialization. She does have a new wound on the back of her left leg and one near the ankle on the lateral lower left leg. Her breathing is better and her edema control has improved. Integumentary (Hair, Skin) Wound #10 status is Open. Original cause of wound was Gradually Appeared. The date acquired was: 03/25/2023. The wound has been in treatment 5 weeks. The wound is located on the Left,Medial Lower Leg. The wound measures 3.5cm length x 2.1cm width x 0.1cm depth; 5.773cm^2 area and 0.577cm^3 volume. There is Fat Layer (Subcutaneous Tissue) exposed. There is no tunneling or undermining noted. There is a medium amount of serous drainage noted. The wound margin is indistinct and nonvisible. There is large (67-100%) red granulation within the wound bed. There is no necrotic tissue within the wound bed. The periwound skin appearance had no abnormalities noted for texture. The periwound skin appearance had no abnormalities noted for moisture. The periwound skin appearance exhibited: Hemosiderin Staining. Periwound temperature was noted as No Abnormality. Wound #11 status is Open. Original cause of wound was Gradually Appeared. The date acquired was: 03/25/2023. The wound has been in treatment 5 weeks. The wound is located on the Right,Lateral Lower Leg. The wound measures 3.5cm  length x 4cm width x 0.1cm depth; 10.996cm^2 area and 1.1cm^3 volume. There is Fat Layer (Subcutaneous Tissue) exposed. There is no tunneling or undermining noted. There is a medium amount of serous drainage noted. The wound margin is flat and intact. There is large (67-100%) red, hyper - granulation within the wound bed. There is no necrotic tissue within the wound bed. The periwound skin appearance had no abnormalities noted for texture. The periwound skin appearance had no abnormalities noted for moisture. The periwound skin appearance exhibited: Hemosiderin Staining. Periwound temperature was noted as No Abnormality. Wound #12 status is Open. Original cause of wound was Gradually Appeared. The date acquired was: 05/07/2023. The wound has been in treatment 2 weeks. The wound is located on the Left,Anterior Lower Leg. The wound measures 3.4cm length x 3.5cm width x 0.1cm depth; 9.346cm^2 area and 0.935cm^3 volume. There is Fat Layer (Subcutaneous Tissue) exposed. There is no tunneling or undermining noted. There is a medium amount of serous drainage noted. The wound margin is indistinct and nonvisible. There is large (67-100%) red granulation within the wound bed. There is no necrotic tissue within the wound bed. The Taylor ([id number]) [id number] Page 10 of 14 periwound skin appearance had no abnormalities noted for texture. The periwound skin appearance had no abnormalities noted for moisture. The periwound skin appearance exhibited: Hemosiderin Staining. Periwound temperature was noted as No Abnormality. Wound #13 status is Open. Original cause of wound was Gradually Appeared. The date acquired was: 05/22/2023. The wound is located on the Left,Posterior Lower Leg. The wound measures 1.1cm length x 1.3cm width x 0.1cm depth; 1.123cm^2 area and 0.112cm^3 volume. There is Fat Layer (Subcutaneous Tissue) exposed. There is no tunneling or undermining noted. There is a  medium amount of serous drainage noted. The wound margin is flat and intact. There is medium (34-66%) red, hyper - granulation within the wound bed. There is a medium (34-66%) amount of necrotic tissue within the wound bed including Adherent Slough. The periwound skin appearance had  no abnormalities noted for texture. The periwound skin appearance had no abnormalities noted for moisture. The periwound skin appearance exhibited: Hemosiderin Staining. Periwound temperature was noted as No Abnormality. Wound #14 status is Open. Original cause of wound was Gradually Appeared. The date acquired was: 05/22/2023. The wound is located on the Left,Lateral Lower Leg. The wound measures 0.9cm length x 1.5cm width x 0.1cm depth; 1.06cm^2 area and 0.106cm^3 volume. There is Fat Layer (Subcutaneous Tissue) exposed. There is no tunneling or undermining noted. There is a small amount of serous drainage noted. The wound margin is flat and intact. There is large (67- 100%) red granulation within the wound bed. There is a small (1-33%) amount of necrotic tissue within the wound bed including Adherent Slough. The periwound skin appearance had no abnormalities noted for texture. The periwound skin appearance had no abnormalities noted for moisture. The periwound skin appearance exhibited: Hemosiderin Staining. Periwound temperature was noted as No Abnormality. 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 There was a  Selective/Open Wound Non-Viable Tissue Debridement with a total area of 1.12 sq cm performed by Duanne Guess, MD. With the following instrument(s): Curette to remove Non-Viable tissue/material. Material removed includes [facility]. No specimens were taken. A time out was conducted at 15:45, prior to the start of the procedure. A Minimum amount of bleeding was controlled with Pressure. The procedure was tolerated well with a pain level of 0 throughout and a pain level of 0 following the procedure. Post Debridement Measurements: 1.1cm length x 1.3cm width x 0.1cm depth; 0.112cm^3 volume. Character of Wound/Ulcer Post Debridement is improved. Post procedure Diagnosis Wound #13: Same as Pre-Procedure Wound #14 Pre-procedure diagnosis of Wound #14 is a Lymphedema located on the Left,Lateral Lower Leg . There was a Selective/Open Wound Non-Viable Tissue Debridement with a total area of 1.06 sq cm performed by Duanne Guess, MD. With the following instrument(s): Curette to remove Non-Viable tissue/material. Material removed includes Eschar and Slough and. No specimens were taken. A time out was conducted at 15:45, prior to the start of the procedure. A Minimum amount of bleeding was controlled with Pressure. The procedure was tolerated well with a pain level of 0 throughout and a pain level of 0 following the procedure. Post Debridement Measurements: 0.9cm length x 1.5cm width x 0.1cm depth; 0.106cm^3 volume. Character of Wound/Ulcer Post Debridement is improved. Post procedure Diagnosis Wound #14: Same as Pre-Procedure Wound #10 Pre-procedure diagnosis of Wound #10 is a Lymphedema located on the Left,Medial Lower Leg . There was a Double Layer Compression Therapy Procedure by Zenaida Deed, RN. Post procedure Diagnosis Wound #10: Same as Pre-Procedure Notes: urgo lite. Wound #11 Pre-procedure diagnosis of Wound #11 is a Lymphedema located on the Right,Lateral Lower Leg . There was a Double Layer  Compression Therapy Procedure by Zenaida Deed, RN. Post procedure Diagnosis Wound #11: Same as Pre-Procedure Notes: urgo lite. Plan Follow-up Appointments: Return Appointment in 1 week. - Dr. Lady Gary RM 3 Wed 9/18 @ 2:00 pm Bathing/ Shower/ Hygiene: May shower with protection but do not get wound dressing(s) wet. Protect dressing(s) with water  repellant cover (for example, large plastic bag) or a cast cover and may then take shower. Edema Control - Lymphedema / SCD / Other: Elevate legs to the level of the heart or above for 30 minutes daily and/or when sitting for 3-4 times a day throughout the day. Avoid standing for long periods of time. Exercise regularly [name], [name] ([id number]) [id number] Page 11 of 14 WOUND #10: - Lower Leg Wound Laterality: Left, Medial Peri-Wound Care: Sween Lotion (Moisturizing lotion) 1 x Per Week/30 Days Discharge Instructions: Apply moisturizing lotion as directed Prim Dressing: Maxorb Extra Ag+ Alginate Dressing, 4x4.75 (in/in) 1 x Per Week/30 Days ary Discharge Instructions: Apply to wound bed as instructed Secondary Dressing: ABD Pad, 8x10 1 x Per Week/30 Days Discharge Instructions: Apply over primary dressing as directed. Com pression Wrap: Urgo K2 Lite, (equivalent to a 3 layer) two layer compression system, regular 1 x Per Week/30 Days Discharge Instructions: Apply Urgo K2 Lite as directed (alternative to 3 layer compression). WOUND #11: - Lower Leg Wound Laterality: Right, Lateral Peri-Wound Care: Sween Lotion (Moisturizing lotion) 1 x Per Week/30 Days Discharge Instructions: Apply moisturizing lotion as directed Prim Dressing: Maxorb Extra Ag+ Alginate Dressing, 4x4.75 (in/in) 1 x Per Week/30 Days ary Discharge Instructions: Apply to wound bed as instructed Secondary Dressing: ABD Pad, 8x10 1 x Per Week/30 Days Discharge Instructions: Apply over primary dressing as directed. Com pression Wrap: Urgo K2 Lite,  (equivalent to a 3 layer) two layer compression system, regular 1 x Per Week/30 Days Discharge Instructions: Apply Urgo K2 Lite as directed (alternative to 3 layer compression). WOUND #12: - Lower Leg Wound Laterality: Left, Anterior Peri-Wound Care: Zinc Oxide Ointment 30g tube 1 x Per Week/30 Days Discharge Instructions: Apply Zinc Oxide to periwound with each dressing change Peri-Wound Care: Sween Lotion (Moisturizing lotion) 1 x Per Week/30 Days Discharge Instructions: Apply moisturizing lotion as directed Prim Dressing: Maxorb Extra Ag+ Alginate Dressing, 4x4.75 (in/in) 1 x Per Week/30 Days ary Discharge Instructions: Apply to wound bed as instructed Secondary Dressing: ABD Pad, 8x10 1 x Per Week/30 Days Discharge Instructions: Apply over primary dressing as directed. Secondary Dressing: Zetuvit Plus 4x8 in 1 x Per Week/30 Days Discharge Instructions: Apply over primary dressing as directed. Com pression Wrap: Urgo K2 Lite, (equivalent to a 3 layer) two layer compression system, regular 1 x Per Week/30 Days Discharge Instructions: Apply Urgo K2 Lite as directed (alternative to 3 layer compression). WOUND #13: - Lower Leg Wound Laterality: Left, Posterior Peri-Wound Care: Sween Lotion (Moisturizing lotion) 1 x Per Week/30 Days Discharge Instructions: Apply moisturizing lotion as directed Prim Dressing: Maxorb Extra Ag+ Alginate Dressing, 4x4.75 (in/in) 1 x Per Week/30 Days ary Discharge Instructions: Apply to wound bed as instructed Secondary Dressing: ABD Pad, 8x10 1 x Per Week/30 Days Discharge Instructions: Apply over primary dressing as directed. Com pression Wrap: Urgo K2 Lite, (equivalent to a 3 layer) two layer compression system, regular 1 x Per Week/30 Days Discharge Instructions: Apply Urgo K2 Lite as directed (alternative to 3 layer compression). WOUND #14: - Lower Leg Wound Laterality: Left, Lateral Peri-Wound Care: Sween Lotion (Moisturizing lotion) 1 x Per Week/30  Days Discharge Instructions: Apply moisturizing lotion as directed Prim Dressing: Maxorb Extra Ag+ Alginate Dressing, 4x4.75 (in/in) 1 x Per Week/30 Days ary Discharge Instructions: Apply to wound bed as instructed Secondary Dressing: ABD Pad, 8x10 1 x Per Week/30 Days Discharge Instructions: Apply over primary dressing as directed. Com pression Wrap: Urgo K2 Lite, (equivalent to a 3 layer) two layer compression  system, regular 1 x Per Week/30 Days Discharge Instructions: Apply Urgo K2 Lite as directed (alternative to 3 layer compression). 05/22/2023: Her wounds have improved this week. There are large areas of epithelialization. She does have a new wound on the back of her left leg and one near the ankle on the lateral lower left leg. Her breathing is better and her edema control has improved. I used a curette to debride slough from the 2 new wounds today. The existing wounds did not require debridement. We will continue silver alginate and Urgo light compression wraps bilaterally. Follow-up in 1 week. Electronic Signature(s) Signed: 05/22/2023 4:28:00 PM By: Duanne Guess MD FACS Entered By: Duanne Guess on 05/22/2023 16:27:59 -------------------------------------------------------------------------------- HxROS Details Patient Name: Date of Service: [name], [name] 05/22/2023 2:45 PM Medical Record Number: [id number] Patient Account Number: [id number] Date of Birth/Sex: Treating RN: 1941/04/29 (82 y.o. F) Primary Care Provider: Aliene Beams Other Clinician: Referring Provider: Treating Provider/Extender: Karna Dupes in Treatment: 5 Information Obtained From Patient [name], [name] ([id number]) [id number] Page 12 of 14 Constitutional Symptoms (General Health) Medical History: Past Medical History Notes: obesity Eyes Medical History: Negative for: Cataracts; Glaucoma; Optic Neuritis Ear/Nose/Mouth/Throat Medical  History: Negative for: Chronic sinus problems/congestion; Middle ear problems Hematologic/Lymphatic Medical History: Positive for: Lymphedema Negative for: Anemia; Hemophilia; Human Immunodeficiency Virus; Sickle Cell Disease Respiratory Medical History: Negative for: Aspiration; Asthma; Chronic Obstructive Pulmonary Disease (COPD); Pneumothorax; Sleep Apnea; Tuberculosis Cardiovascular Medical History: Positive for: Congestive Heart Failure; Hypertension; Peripheral Venous Disease Negative for: Angina; Arrhythmia; Coronary Artery Disease; Deep Vein Thrombosis; Hypotension; Myocardial Infarction; Peripheral Arterial Disease; Phlebitis; Vasculitis Past Medical History Notes: dyslipidemia, pacemaker Gastrointestinal Medical History: Negative for: Cirrhosis ; Colitis; Crohns; Hepatitis A; Hepatitis B; Hepatitis C Past Medical History Notes: GERD Endocrine Medical History: Positive for: Type II Diabetes Negative for: Type I Diabetes Past Medical History Notes: thyroid nodule Time with diabetes: 15 Treated with: Insulin Blood sugar tested every day: Yes Tested : 2-3 times per day Genitourinary Medical History: Negative for: End Stage Renal Disease Past Medical History Notes: CKD stage 3 Immunological Medical History: Negative for: Lupus Erythematosus; Raynauds; Scleroderma Integumentary (Skin) Medical History: Negative for: History of Burn Musculoskeletal Medical History: Positive for: Osteoarthritis Past Medical History Notes: fibromyalgia, lumbar spinal stenosis Neurologic Medical HistoryKAHRI, SECHREST ([id number]) [id number] Page 13 of 14 Positive for: Neuropathy Negative for: Dementia; Quadriplegia; Paraplegia; Seizure Disorder Oncologic Medical History: Negative for: Received Chemotherapy; Received Radiation Past Medical History Notes: hx colon CA Psychiatric Medical History: Positive for: Confinement Anxiety Negative for:  Anorexia/bulimia Immunizations Pneumococcal Vaccine: Received Pneumococcal Vaccination: No Implantable Devices Yes Hospitalization / Surgery History Type of Hospitalization/Surgery tibia nail insertion right cardiac cath lumbar laminectomy pacemaker implanted appendectomy lumpectomy left breast DandC uterus partial nephrectomy tosillectomy tubal ligation Family and Social History Cancer: No; Diabetes: Yes - Siblings; Heart Disease: Yes - Mother; Hereditary Spherocytosis: No; Hypertension: Yes - Mother; Kidney Disease: Yes - Siblings; Lung Disease: No; Seizures: No; Stroke: No; Thyroid Problems: No; Tuberculosis: No; Never smoker; Marital Status - Widowed; Alcohol Use: Rarely; Drug Use: No History; Caffeine Use: Daily; Financial Concerns: No; Food, Clothing or Shelter Needs: No; Support System Lacking: No; Transportation Concerns: No Electronic Signature(s) Signed: 05/22/2023 5:39:35 PM By: Duanne Guess MD FACS Entered By: Duanne Guess on 05/22/2023 16:15:31 -------------------------------------------------------------------------------- SuperBill Details Patient Name: Date of Service: Charline Bills 05/22/2023 Medical Record Number: [id number] Patient Account Number: [id number] Date of Birth/Sex: Treating RN: [birth year] (82 y.o. F) Primary Care Provider: Aliene Beams Other Clinician: Referring  Provider: Treating Provider/Extender: Karna Dupes in Treatment: 5 Diagnosis Coding ICD-10 Codes Code Description [phone] Non-pressure chronic ulcer of other part of right lower leg limited to breakdown of skin L97.821 Non-pressure chronic ulcer of other part of left lower leg limited to breakdown of skin L84 Corns and callosities I89.0 Lymphedema, not elsewhere classified I87.2 Venous insufficiency (chronic) (peripheral) Rempel, Alonnah ([id number]) [id number] Page 14 of 14 E11.622 Type 2 diabetes mellitus with other skin  ulcer I50.9 Heart failure, unspecified N18.4 Chronic kidney disease, stage 4 (severe) Facility Procedures : CPT4 Code: [id number] Description: 97597 - DEBRIDE WOUND 1ST 20 SQ CM OR < ICD-10 Diagnosis Description L97.821 Non-pressure chronic ulcer of other part of left lower leg limited to breakdown of sk Modifier: in Quantity: 1 : CPT4 Code: 41660630 Description: (Facility Use Only) [id number] - APPLY MULTLAY COMPRS LWR RT LEG Modifier: 59 Quantity: 1 Physician Procedures : CPT4 Code Description Modifier [id number] 99214 - WC PHYS LEVEL 4 - EST PT 25 ICD-10 Diagnosis Description L97.811 Non-pressure chronic ulcer of other part of right lower leg limited to breakdown of skin L97.821 Non-pressure chronic ulcer of other part of  left lower leg limited to breakdown of skin I89.0 Lymphedema, not elsewhere classified I87.2 Venous insufficiency (chronic) (peripheral) Quantity: 1 : [id number] 97597 - WC PHYS DEBR WO ANESTH 20 SQ CM ICD-10 Diagnosis Description L97.821 Non-pressure chronic ulcer of other part of left lower leg limited to breakdown of skin Quantity: 1 Electronic Signature(s) Signed: 05/22/2023 5:11:25 PM By: Zenaida Deed RN, BSN Signed: 05/22/2023 5:39:35 PM By: Duanne Guess MD FACS Previous Signature: 05/22/2023 4:28:49 PM Version By: Duanne Guess MD FACS Entered By: Zenaida Deed on 05/22/2023 16:46:38

## 2023-05-28 ENCOUNTER — Telehealth (HOSPITAL_COMMUNITY): Payer: Self-pay

## 2023-05-28 NOTE — Telephone Encounter (Addendum)
Patient aware of echocardiogram results  ----- Message from Jacklynn Ganong sent at 05/28/2023  8:03 AM EDT ----- EF 60-65%, normal RV, mild MR. Stable echo compared to prior

## 2023-05-29 ENCOUNTER — Encounter (HOSPITAL_BASED_OUTPATIENT_CLINIC_OR_DEPARTMENT_OTHER): Payer: Medicare Other | Admitting: General Surgery

## 2023-05-29 DIAGNOSIS — L97811 Non-pressure chronic ulcer of other part of right lower leg limited to breakdown of skin: Secondary | ICD-10-CM | POA: Diagnosis not present

## 2023-05-29 DIAGNOSIS — N184 Chronic kidney disease, stage 4 (severe): Secondary | ICD-10-CM | POA: Diagnosis not present

## 2023-05-29 DIAGNOSIS — E1151 Type 2 diabetes mellitus with diabetic peripheral angiopathy without gangrene: Secondary | ICD-10-CM | POA: Diagnosis not present

## 2023-05-29 DIAGNOSIS — E11622 Type 2 diabetes mellitus with other skin ulcer: Secondary | ICD-10-CM | POA: Diagnosis not present

## 2023-05-29 DIAGNOSIS — I890 Lymphedema, not elsewhere classified: Secondary | ICD-10-CM | POA: Diagnosis not present

## 2023-05-29 DIAGNOSIS — E1142 Type 2 diabetes mellitus with diabetic polyneuropathy: Secondary | ICD-10-CM | POA: Diagnosis not present

## 2023-05-29 DIAGNOSIS — L97821 Non-pressure chronic ulcer of other part of left lower leg limited to breakdown of skin: Secondary | ICD-10-CM | POA: Diagnosis not present

## 2023-05-29 DIAGNOSIS — E1122 Type 2 diabetes mellitus with diabetic chronic kidney disease: Secondary | ICD-10-CM | POA: Diagnosis not present

## 2023-05-29 DIAGNOSIS — L84 Corns and callosities: Secondary | ICD-10-CM | POA: Diagnosis not present

## 2023-05-29 DIAGNOSIS — I272 Pulmonary hypertension, unspecified: Secondary | ICD-10-CM | POA: Diagnosis not present

## 2023-05-29 DIAGNOSIS — I872 Venous insufficiency (chronic) (peripheral): Secondary | ICD-10-CM | POA: Diagnosis not present

## 2023-05-29 DIAGNOSIS — I5032 Chronic diastolic (congestive) heart failure: Secondary | ICD-10-CM | POA: Diagnosis not present

## 2023-05-29 DIAGNOSIS — I130 Hypertensive heart and chronic kidney disease with heart failure and stage 1 through stage 4 chronic kidney disease, or unspecified chronic kidney disease: Secondary | ICD-10-CM | POA: Diagnosis not present

## 2023-05-29 DIAGNOSIS — Z833 Family history of diabetes mellitus: Secondary | ICD-10-CM | POA: Diagnosis not present

## 2023-05-29 DIAGNOSIS — G4733 Obstructive sleep apnea (adult) (pediatric): Secondary | ICD-10-CM | POA: Diagnosis not present

## 2023-05-29 NOTE — Progress Notes (Signed)
diagnosis of Wound #10 is a Lymphedema located on the Left,Medial Lower Leg . There was a Double Layer Compression Therapy Procedure by Zenaida Deed, RN. Post procedure Diagnosis Wound #10: Same as Pre-Procedure There was a Double Layer Compression Therapy Procedure by Zenaida Deed, RN. Post procedure Diagnosis Wound #: Same as Pre-Procedure Plan Follow-up Appointments: Return Appointment in 1 week. - Dr. Lady Gary RM 1 Wed 9/25 @ 12:30 pm Other: - Bring compression garments to appointment next week Bathing/ Shower/  Hygiene: May shower with protection but do not get wound dressing(s) wet. Protect dressing(s) with water repellant cover (for example, large plastic bag) or a cast cover and may then take shower. Edema Control - Lymphedema / SCD / Other: Elevate legs to the level of the heart or above for 30 minutes daily and/or when sitting for 3-4 times a day throughout the day. Avoid standing for long periods of time. Exercise regularly Non Wound Condition: Apply the following to affected area as directed: - urgo lite WOUND #10: - Lower Leg Wound Laterality: Left, Medial Peri-Wound Care: Sween Lotion (Moisturizing lotion) 1 x Per Week/30 Days Discharge Instructions: Apply moisturizing lotion as directed Prim Dressing: Maxorb Extra Ag+ Alginate Dressing, 4x4.75 (in/in) 1 x Per Week/30 Days ary Discharge Instructions: Apply to wound bed as instructed Secondary Dressing: ABD Pad, 8x10 1 x Per Week/30 Days Discharge Instructions: Apply over primary dressing as directed. Com pression Wrap: Urgo K2 Lite, (equivalent to a 3 layer) two layer compression system, regular 1 x Per Week/30 Days Discharge Instructions: Apply Urgo K2 Lite as directed (alternative to 3 layer compression). WOUND #14: - Lower Leg Wound Laterality: Left, Lateral Peri-Wound Care: Sween Lotion (Moisturizing lotion) 1 x Per Week/30 Days Discharge Instructions: Apply moisturizing lotion as directed Prim Dressing: Maxorb Extra Ag+ Alginate Dressing, 4x4.75 (in/in) 1 x Per Week/30 Days LEV, PONCE ([id number]) [id number] Page 9 of 11 Discharge Instructions: Apply to wound bed as instructed Secondary Dressing: ABD Pad, 8x10 1 x Per Week/30 Days Discharge Instructions: Apply over primary dressing as directed. Compression Wrap: Urgo K2 Lite, (equivalent to a 3 layer) two layer compression system, regular 1 x Per Week/30 Days Discharge Instructions: Apply Urgo K2 Lite as directed (alternative to 3 layer  compression). 05/29/2023: The right leg is completely healed. The left leg has 2 tiny slightly raw-looking areas. Edema control is good. No debridement was necessary today. We will apply silver alginate and an Urgo lite wrap to the left leg. We are also going to wrap her right leg as she does not have her juxta lite stockings with her today and does not know how to apply them at home. She will bring them with her to her next clinic visit. I anticipate that her left leg will also be healed next week. Follow-up in 1 week's time. Electronic Signature(s) Signed: 05/29/2023 3:33:32 PM By: Duanne Guess MD FACS Entered By: Duanne Guess on 05/29/2023 12:33:32 -------------------------------------------------------------------------------- HxROS Details Patient Name: Date of Service: AMNERIS, GRIVAS 05/29/2023 2:00 PM Medical Record Number: [id number] Patient Account Number: [id number] Date of Birth/Sex: Treating RN: 02/03/1941 (82 y.o. F) Primary Care Provider: Aliene Beams Other Clinician: Referring Provider: Treating Provider/Extender: Karna Dupes in Treatment: 6 Information Obtained From Patient Constitutional Symptoms (General Health) Medical History: Past Medical History Notes: obesity Eyes Medical History: Negative for: Cataracts; Glaucoma; Optic Neuritis Ear/Nose/Mouth/Throat Medical History: Negative for: Chronic sinus problems/congestion; Middle ear problems Hematologic/Lymphatic Medical History: Positive for: Lymphedema Negative for: Anemia; Hemophilia; Human Immunodeficiency Virus; Sickle Cell  Disease Respiratory Medical History: Negative for: Aspiration; Asthma; Chronic Obstructive Pulmonary Disease (COPD); Pneumothorax; Sleep Apnea; Tuberculosis Cardiovascular Medical History: Positive for: Congestive Heart Failure; Hypertension; Peripheral Venous Disease Negative for: Angina; Arrhythmia; Coronary Artery Disease; Deep Vein Thrombosis;  Hypotension; Myocardial Infarction; Peripheral Arterial Disease; Phlebitis; Vasculitis Past Medical History Notes: dyslipidemia, pacemaker Gastrointestinal Medical HistoryKERRYN, CADWALLADER ([id number]) [id number] Page 10 of 11 Negative for: Cirrhosis ; Colitis; Crohns; Hepatitis A; Hepatitis B; Hepatitis C Past Medical History Notes: GERD Endocrine Medical History: Positive for: Type II Diabetes Negative for: Type I Diabetes Past Medical History Notes: thyroid nodule Time with diabetes: 15 Treated with: Insulin Blood sugar tested every day: Yes Tested : 2-3 times per day Genitourinary Medical History: Negative for: End Stage Renal Disease Past Medical History Notes: CKD stage 3 Immunological Medical History: Negative for: Lupus Erythematosus; Raynauds; Scleroderma Integumentary (Skin) Medical History: Negative for: History of Burn Musculoskeletal Medical History: Positive for: Osteoarthritis Past Medical History Notes: fibromyalgia, lumbar spinal stenosis Neurologic Medical History: Positive for: Neuropathy Negative for: Dementia; Quadriplegia; Paraplegia; Seizure Disorder Oncologic Medical History: Negative for: Received Chemotherapy; Received Radiation Past Medical History Notes: hx colon CA Psychiatric Medical History: Positive for: Confinement Anxiety Negative for: Anorexia/bulimia Immunizations Pneumococcal Vaccine: Received Pneumococcal Vaccination: No Implantable Devices Yes Hospitalization / Surgery History Type of Hospitalization/Surgery tibia nail insertion right cardiac cath lumbar laminectomy pacemaker implanted appendectomy lumpectomy left breast DandC uterus partial nephrectomy tosillectomy tubal ligation Schmaltz, Parthena ([id number]) [id number] Page 70 of 32 Family and Social History Cancer: No; Diabetes: Yes - Siblings; Heart Disease: Yes - Mother; Hereditary Spherocytosis: No;  Hypertension: Yes - Mother; Kidney Disease: Yes - Siblings; Lung Disease: No; Seizures: No; Stroke: No; Thyroid Problems: No; Tuberculosis: No; Never smoker; Marital Status - Widowed; Alcohol Use: Rarely; Drug Use: No History; Caffeine Use: Daily; Financial Concerns: No; Food, Clothing or Shelter Needs: No; Support System Lacking: No; Transportation Concerns: No Electronic Signature(s) Signed: 05/29/2023 4:02:03 PM By: Duanne Guess MD FACS Entered By: Duanne Guess on 05/29/2023 12:30:51 -------------------------------------------------------------------------------- SuperBill Details Patient Name: Date of Service: WINIFRED, CRUMMEY 05/29/2023 Medical Record Number: [id number] Patient Account Number: [id number] Date of Birth/Sex: Treating RN: 04-15-1941 (82 y.o. [name] Primary Care Provider: Aliene Beams Other Clinician: Referring Provider: Treating Provider/Extender: Karna Dupes in Treatment: 6 Diagnosis Coding ICD-10 Codes Code Description [phone] Non-pressure chronic ulcer of other part of right lower leg limited to breakdown of skin L97.821 Non-pressure chronic ulcer of other part of left lower leg limited to breakdown of skin L84 Corns and callosities I89.0 Lymphedema, not elsewhere classified I87.2 Venous insufficiency (chronic) (peripheral) E11.622 Type 2 diabetes mellitus with other skin ulcer I50.9 Heart failure, unspecified N18.4 Chronic kidney disease, stage 4 (severe) Facility Procedures : CPT4: Code [id number] Description: 81 BILATERAL: Application of multi-layer venous compression system; leg (below knee), including ankle and t. Modifier: Quantity: 1 Physician Procedures : CPT4 Code Description Modifier [id number] - WC PHYS LEVEL 3 - EST PT ICD-10 Diagnosis Description L97.821 Non-pressure chronic ulcer of other part of left lower leg limited to breakdown of skin I89.0 Lymphedema, not elsewhere classified I87.2  Venous  insufficiency (chronic) (peripheral) E11.622 Type 2 diabetes mellitus with other skin ulcer Quantity: 1 Electronic Signature(s) Signed: 05/29/2023 3:33:53 PM By: Duanne Guess MD FACS Entered By: Duanne Guess on 05/29/2023 12:33:53  05/29/2023 3:30:43 PM By: Duanne Guess MD FACS Entered By: Duanne Guess on 05/29/2023 12:30:43 [name] ([id number]) [id number] Page 3 of 11 -------------------------------------------------------------------------------- Physical Exam Details Patient Name: Date of Service: PORSHE, SANDLIN 05/29/2023 2:00 PM Medical Record Number: [id number] Patient Account Number: [id number] Date of Birth/Sex: Treating RN: [birth year] (82 y.o. F) Primary Care Provider: Aliene Beams Other Clinician: Referring Provider: Treating Provider/Extender: Karna Dupes in Treatment: 6 Constitutional Hypertensive, asymptomatic. . . . Marland Kitchen Respiratory Normal work of breathing  on room air. Notes 05/29/2023: The right leg is completely healed. The left leg has 2 tiny slightly raw-looking areas. Edema control is good. Electronic Signature(s) Signed: 05/29/2023 3:31:37 PM By: Duanne Guess MD FACS Entered By: Duanne Guess on 05/29/2023 12:31:37 -------------------------------------------------------------------------------- Physician Orders Details Patient Name: Date of Service: VELERA, FENDLER 05/29/2023 2:00 PM Medical Record Number: [id number] Patient Account Number: [id number] Date of Birth/Sex: Treating RN: 07-Oct-1940 (82 y.o. Billy Coast, Bonita Quin Primary Care Provider: Aliene Beams Other Clinician: Referring Provider: Treating Provider/Extender: Karna Dupes in Treatment: 6 The following information was scribed by: Zenaida Deed The information was scribed for: Duanne Guess Verbal / Phone Orders: No Diagnosis Coding ICD-10 Coding Code Description L97.811 Non-pressure chronic ulcer of other part of right lower leg limited to breakdown of skin L97.821 Non-pressure chronic ulcer of other part of left lower leg limited to breakdown of skin L84 Corns and callosities I89.0 Lymphedema, not elsewhere classified I87.2 Venous insufficiency (chronic) (peripheral) E11.622 Type 2 diabetes mellitus with other skin ulcer I50.9 Heart failure, unspecified N18.4 Chronic kidney disease, stage 4 (severe) Follow-up Appointments ppointment in 1 week. - Dr. Lady Gary RM 1 Return A Wed 9/25 @ 12:30 pm Other: - Bring compression garments to appointment next week Bathing/ Shower/ Hygiene May shower with protection but do not get wound dressing(s) wet. Protect dressing(s) with water repellant cover (for example, large plastic bag) or a cast cover and may then take shower. Edema Control - Lymphedema / SCD / Other Elevate legs to the level of the heart or above for 30 minutes daily and/or when sitting for 3-4 times a day throughout the  day. TULANI, KOUBA ([id number]) [id number] Page 4 of 11 Avoid standing for long periods of time. Exercise regularly Non Wound Condition Right Lower Extremity pply the following to affected area as directed: - urgo lite A Wound Treatment Wound #10 - Lower Leg Wound Laterality: Left, Medial Peri-Wound Care: Sween Lotion (Moisturizing lotion) 1 x Per Week/30 Days Discharge Instructions: Apply moisturizing lotion as directed Prim Dressing: Maxorb Extra Ag+ Alginate Dressing, 4x4.75 (in/in) 1 x Per Week/30 Days ary Discharge Instructions: Apply to wound bed as instructed Secondary Dressing: ABD Pad, 8x10 1 x Per Week/30 Days Discharge Instructions: Apply over primary dressing as directed. Compression Wrap: Urgo K2 Lite, (equivalent to a 3 layer) two layer compression system, regular 1 x Per Week/30 Days Discharge Instructions: Apply Urgo K2 Lite as directed (alternative to 3 layer compression). Wound #14 - Lower Leg Wound Laterality: Left, Lateral Peri-Wound Care: Sween Lotion (Moisturizing lotion) 1 x Per Week/30 Days Discharge Instructions: Apply moisturizing lotion as directed Prim Dressing: Maxorb Extra Ag+ Alginate Dressing, 4x4.75 (in/in) 1 x Per Week/30 Days ary Discharge Instructions: Apply to wound bed as instructed Secondary Dressing: ABD Pad, 8x10 1 x Per Week/30 Days Discharge Instructions: Apply over primary dressing as directed. Compression Wrap: Urgo K2 Lite, (equivalent to a 3 layer) two layer compression system, regular 1 x Per Week/30 Days Discharge Instructions: Apply Urgo K2  05/29/2023 3:30:43 PM By: Duanne Guess MD FACS Entered By: Duanne Guess on 05/29/2023 12:30:43 [name] ([id number]) [id number] Page 3 of 11 -------------------------------------------------------------------------------- Physical Exam Details Patient Name: Date of Service: PORSHE, SANDLIN 05/29/2023 2:00 PM Medical Record Number: [id number] Patient Account Number: [id number] Date of Birth/Sex: Treating RN: [birth year] (82 y.o. F) Primary Care Provider: Aliene Beams Other Clinician: Referring Provider: Treating Provider/Extender: Karna Dupes in Treatment: 6 Constitutional Hypertensive, asymptomatic. . . . Marland Kitchen Respiratory Normal work of breathing  on room air. Notes 05/29/2023: The right leg is completely healed. The left leg has 2 tiny slightly raw-looking areas. Edema control is good. Electronic Signature(s) Signed: 05/29/2023 3:31:37 PM By: Duanne Guess MD FACS Entered By: Duanne Guess on 05/29/2023 12:31:37 -------------------------------------------------------------------------------- Physician Orders Details Patient Name: Date of Service: VELERA, FENDLER 05/29/2023 2:00 PM Medical Record Number: [id number] Patient Account Number: [id number] Date of Birth/Sex: Treating RN: 07-Oct-1940 (82 y.o. Billy Coast, Bonita Quin Primary Care Provider: Aliene Beams Other Clinician: Referring Provider: Treating Provider/Extender: Karna Dupes in Treatment: 6 The following information was scribed by: Zenaida Deed The information was scribed for: Duanne Guess Verbal / Phone Orders: No Diagnosis Coding ICD-10 Coding Code Description L97.811 Non-pressure chronic ulcer of other part of right lower leg limited to breakdown of skin L97.821 Non-pressure chronic ulcer of other part of left lower leg limited to breakdown of skin L84 Corns and callosities I89.0 Lymphedema, not elsewhere classified I87.2 Venous insufficiency (chronic) (peripheral) E11.622 Type 2 diabetes mellitus with other skin ulcer I50.9 Heart failure, unspecified N18.4 Chronic kidney disease, stage 4 (severe) Follow-up Appointments ppointment in 1 week. - Dr. Lady Gary RM 1 Return A Wed 9/25 @ 12:30 pm Other: - Bring compression garments to appointment next week Bathing/ Shower/ Hygiene May shower with protection but do not get wound dressing(s) wet. Protect dressing(s) with water repellant cover (for example, large plastic bag) or a cast cover and may then take shower. Edema Control - Lymphedema / SCD / Other Elevate legs to the level of the heart or above for 30 minutes daily and/or when sitting for 3-4 times a day throughout the  day. TULANI, KOUBA ([id number]) [id number] Page 4 of 11 Avoid standing for long periods of time. Exercise regularly Non Wound Condition Right Lower Extremity pply the following to affected area as directed: - urgo lite A Wound Treatment Wound #10 - Lower Leg Wound Laterality: Left, Medial Peri-Wound Care: Sween Lotion (Moisturizing lotion) 1 x Per Week/30 Days Discharge Instructions: Apply moisturizing lotion as directed Prim Dressing: Maxorb Extra Ag+ Alginate Dressing, 4x4.75 (in/in) 1 x Per Week/30 Days ary Discharge Instructions: Apply to wound bed as instructed Secondary Dressing: ABD Pad, 8x10 1 x Per Week/30 Days Discharge Instructions: Apply over primary dressing as directed. Compression Wrap: Urgo K2 Lite, (equivalent to a 3 layer) two layer compression system, regular 1 x Per Week/30 Days Discharge Instructions: Apply Urgo K2 Lite as directed (alternative to 3 layer compression). Wound #14 - Lower Leg Wound Laterality: Left, Lateral Peri-Wound Care: Sween Lotion (Moisturizing lotion) 1 x Per Week/30 Days Discharge Instructions: Apply moisturizing lotion as directed Prim Dressing: Maxorb Extra Ag+ Alginate Dressing, 4x4.75 (in/in) 1 x Per Week/30 Days ary Discharge Instructions: Apply to wound bed as instructed Secondary Dressing: ABD Pad, 8x10 1 x Per Week/30 Days Discharge Instructions: Apply over primary dressing as directed. Compression Wrap: Urgo K2 Lite, (equivalent to a 3 layer) two layer compression system, regular 1 x Per Week/30 Days Discharge Instructions: Apply Urgo K2  diagnosis of Wound #10 is a Lymphedema located on the Left,Medial Lower Leg . There was a Double Layer Compression Therapy Procedure by Zenaida Deed, RN. Post procedure Diagnosis Wound #10: Same as Pre-Procedure There was a Double Layer Compression Therapy Procedure by Zenaida Deed, RN. Post procedure Diagnosis Wound #: Same as Pre-Procedure Plan Follow-up Appointments: Return Appointment in 1 week. - Dr. Lady Gary RM 1 Wed 9/25 @ 12:30 pm Other: - Bring compression garments to appointment next week Bathing/ Shower/  Hygiene: May shower with protection but do not get wound dressing(s) wet. Protect dressing(s) with water repellant cover (for example, large plastic bag) or a cast cover and may then take shower. Edema Control - Lymphedema / SCD / Other: Elevate legs to the level of the heart or above for 30 minutes daily and/or when sitting for 3-4 times a day throughout the day. Avoid standing for long periods of time. Exercise regularly Non Wound Condition: Apply the following to affected area as directed: - urgo lite WOUND #10: - Lower Leg Wound Laterality: Left, Medial Peri-Wound Care: Sween Lotion (Moisturizing lotion) 1 x Per Week/30 Days Discharge Instructions: Apply moisturizing lotion as directed Prim Dressing: Maxorb Extra Ag+ Alginate Dressing, 4x4.75 (in/in) 1 x Per Week/30 Days ary Discharge Instructions: Apply to wound bed as instructed Secondary Dressing: ABD Pad, 8x10 1 x Per Week/30 Days Discharge Instructions: Apply over primary dressing as directed. Com pression Wrap: Urgo K2 Lite, (equivalent to a 3 layer) two layer compression system, regular 1 x Per Week/30 Days Discharge Instructions: Apply Urgo K2 Lite as directed (alternative to 3 layer compression). WOUND #14: - Lower Leg Wound Laterality: Left, Lateral Peri-Wound Care: Sween Lotion (Moisturizing lotion) 1 x Per Week/30 Days Discharge Instructions: Apply moisturizing lotion as directed Prim Dressing: Maxorb Extra Ag+ Alginate Dressing, 4x4.75 (in/in) 1 x Per Week/30 Days LEV, PONCE ([id number]) [id number] Page 9 of 11 Discharge Instructions: Apply to wound bed as instructed Secondary Dressing: ABD Pad, 8x10 1 x Per Week/30 Days Discharge Instructions: Apply over primary dressing as directed. Compression Wrap: Urgo K2 Lite, (equivalent to a 3 layer) two layer compression system, regular 1 x Per Week/30 Days Discharge Instructions: Apply Urgo K2 Lite as directed (alternative to 3 layer  compression). 05/29/2023: The right leg is completely healed. The left leg has 2 tiny slightly raw-looking areas. Edema control is good. No debridement was necessary today. We will apply silver alginate and an Urgo lite wrap to the left leg. We are also going to wrap her right leg as she does not have her juxta lite stockings with her today and does not know how to apply them at home. She will bring them with her to her next clinic visit. I anticipate that her left leg will also be healed next week. Follow-up in 1 week's time. Electronic Signature(s) Signed: 05/29/2023 3:33:32 PM By: Duanne Guess MD FACS Entered By: Duanne Guess on 05/29/2023 12:33:32 -------------------------------------------------------------------------------- HxROS Details Patient Name: Date of Service: AMNERIS, GRIVAS 05/29/2023 2:00 PM Medical Record Number: [id number] Patient Account Number: [id number] Date of Birth/Sex: Treating RN: 02/03/1941 (82 y.o. F) Primary Care Provider: Aliene Beams Other Clinician: Referring Provider: Treating Provider/Extender: Karna Dupes in Treatment: 6 Information Obtained From Patient Constitutional Symptoms (General Health) Medical History: Past Medical History Notes: obesity Eyes Medical History: Negative for: Cataracts; Glaucoma; Optic Neuritis Ear/Nose/Mouth/Throat Medical History: Negative for: Chronic sinus problems/congestion; Middle ear problems Hematologic/Lymphatic Medical History: Positive for: Lymphedema Negative for: Anemia; Hemophilia; Human Immunodeficiency Virus; Sickle Cell  diagnosis of Wound #10 is a Lymphedema located on the Left,Medial Lower Leg . There was a Double Layer Compression Therapy Procedure by Zenaida Deed, RN. Post procedure Diagnosis Wound #10: Same as Pre-Procedure There was a Double Layer Compression Therapy Procedure by Zenaida Deed, RN. Post procedure Diagnosis Wound #: Same as Pre-Procedure Plan Follow-up Appointments: Return Appointment in 1 week. - Dr. Lady Gary RM 1 Wed 9/25 @ 12:30 pm Other: - Bring compression garments to appointment next week Bathing/ Shower/  Hygiene: May shower with protection but do not get wound dressing(s) wet. Protect dressing(s) with water repellant cover (for example, large plastic bag) or a cast cover and may then take shower. Edema Control - Lymphedema / SCD / Other: Elevate legs to the level of the heart or above for 30 minutes daily and/or when sitting for 3-4 times a day throughout the day. Avoid standing for long periods of time. Exercise regularly Non Wound Condition: Apply the following to affected area as directed: - urgo lite WOUND #10: - Lower Leg Wound Laterality: Left, Medial Peri-Wound Care: Sween Lotion (Moisturizing lotion) 1 x Per Week/30 Days Discharge Instructions: Apply moisturizing lotion as directed Prim Dressing: Maxorb Extra Ag+ Alginate Dressing, 4x4.75 (in/in) 1 x Per Week/30 Days ary Discharge Instructions: Apply to wound bed as instructed Secondary Dressing: ABD Pad, 8x10 1 x Per Week/30 Days Discharge Instructions: Apply over primary dressing as directed. Com pression Wrap: Urgo K2 Lite, (equivalent to a 3 layer) two layer compression system, regular 1 x Per Week/30 Days Discharge Instructions: Apply Urgo K2 Lite as directed (alternative to 3 layer compression). WOUND #14: - Lower Leg Wound Laterality: Left, Lateral Peri-Wound Care: Sween Lotion (Moisturizing lotion) 1 x Per Week/30 Days Discharge Instructions: Apply moisturizing lotion as directed Prim Dressing: Maxorb Extra Ag+ Alginate Dressing, 4x4.75 (in/in) 1 x Per Week/30 Days LEV, PONCE ([id number]) [id number] Page 9 of 11 Discharge Instructions: Apply to wound bed as instructed Secondary Dressing: ABD Pad, 8x10 1 x Per Week/30 Days Discharge Instructions: Apply over primary dressing as directed. Compression Wrap: Urgo K2 Lite, (equivalent to a 3 layer) two layer compression system, regular 1 x Per Week/30 Days Discharge Instructions: Apply Urgo K2 Lite as directed (alternative to 3 layer  compression). 05/29/2023: The right leg is completely healed. The left leg has 2 tiny slightly raw-looking areas. Edema control is good. No debridement was necessary today. We will apply silver alginate and an Urgo lite wrap to the left leg. We are also going to wrap her right leg as she does not have her juxta lite stockings with her today and does not know how to apply them at home. She will bring them with her to her next clinic visit. I anticipate that her left leg will also be healed next week. Follow-up in 1 week's time. Electronic Signature(s) Signed: 05/29/2023 3:33:32 PM By: Duanne Guess MD FACS Entered By: Duanne Guess on 05/29/2023 12:33:32 -------------------------------------------------------------------------------- HxROS Details Patient Name: Date of Service: AMNERIS, GRIVAS 05/29/2023 2:00 PM Medical Record Number: [id number] Patient Account Number: [id number] Date of Birth/Sex: Treating RN: 02/03/1941 (82 y.o. F) Primary Care Provider: Aliene Beams Other Clinician: Referring Provider: Treating Provider/Extender: Karna Dupes in Treatment: 6 Information Obtained From Patient Constitutional Symptoms (General Health) Medical History: Past Medical History Notes: obesity Eyes Medical History: Negative for: Cataracts; Glaucoma; Optic Neuritis Ear/Nose/Mouth/Throat Medical History: Negative for: Chronic sinus problems/congestion; Middle ear problems Hematologic/Lymphatic Medical History: Positive for: Lymphedema Negative for: Anemia; Hemophilia; Human Immunodeficiency Virus; Sickle Cell  diagnosis of Wound #10 is a Lymphedema located on the Left,Medial Lower Leg . There was a Double Layer Compression Therapy Procedure by Zenaida Deed, RN. Post procedure Diagnosis Wound #10: Same as Pre-Procedure There was a Double Layer Compression Therapy Procedure by Zenaida Deed, RN. Post procedure Diagnosis Wound #: Same as Pre-Procedure Plan Follow-up Appointments: Return Appointment in 1 week. - Dr. Lady Gary RM 1 Wed 9/25 @ 12:30 pm Other: - Bring compression garments to appointment next week Bathing/ Shower/  Hygiene: May shower with protection but do not get wound dressing(s) wet. Protect dressing(s) with water repellant cover (for example, large plastic bag) or a cast cover and may then take shower. Edema Control - Lymphedema / SCD / Other: Elevate legs to the level of the heart or above for 30 minutes daily and/or when sitting for 3-4 times a day throughout the day. Avoid standing for long periods of time. Exercise regularly Non Wound Condition: Apply the following to affected area as directed: - urgo lite WOUND #10: - Lower Leg Wound Laterality: Left, Medial Peri-Wound Care: Sween Lotion (Moisturizing lotion) 1 x Per Week/30 Days Discharge Instructions: Apply moisturizing lotion as directed Prim Dressing: Maxorb Extra Ag+ Alginate Dressing, 4x4.75 (in/in) 1 x Per Week/30 Days ary Discharge Instructions: Apply to wound bed as instructed Secondary Dressing: ABD Pad, 8x10 1 x Per Week/30 Days Discharge Instructions: Apply over primary dressing as directed. Com pression Wrap: Urgo K2 Lite, (equivalent to a 3 layer) two layer compression system, regular 1 x Per Week/30 Days Discharge Instructions: Apply Urgo K2 Lite as directed (alternative to 3 layer compression). WOUND #14: - Lower Leg Wound Laterality: Left, Lateral Peri-Wound Care: Sween Lotion (Moisturizing lotion) 1 x Per Week/30 Days Discharge Instructions: Apply moisturizing lotion as directed Prim Dressing: Maxorb Extra Ag+ Alginate Dressing, 4x4.75 (in/in) 1 x Per Week/30 Days LEV, PONCE ([id number]) [id number] Page 9 of 11 Discharge Instructions: Apply to wound bed as instructed Secondary Dressing: ABD Pad, 8x10 1 x Per Week/30 Days Discharge Instructions: Apply over primary dressing as directed. Compression Wrap: Urgo K2 Lite, (equivalent to a 3 layer) two layer compression system, regular 1 x Per Week/30 Days Discharge Instructions: Apply Urgo K2 Lite as directed (alternative to 3 layer  compression). 05/29/2023: The right leg is completely healed. The left leg has 2 tiny slightly raw-looking areas. Edema control is good. No debridement was necessary today. We will apply silver alginate and an Urgo lite wrap to the left leg. We are also going to wrap her right leg as she does not have her juxta lite stockings with her today and does not know how to apply them at home. She will bring them with her to her next clinic visit. I anticipate that her left leg will also be healed next week. Follow-up in 1 week's time. Electronic Signature(s) Signed: 05/29/2023 3:33:32 PM By: Duanne Guess MD FACS Entered By: Duanne Guess on 05/29/2023 12:33:32 -------------------------------------------------------------------------------- HxROS Details Patient Name: Date of Service: AMNERIS, GRIVAS 05/29/2023 2:00 PM Medical Record Number: [id number] Patient Account Number: [id number] Date of Birth/Sex: Treating RN: 02/03/1941 (82 y.o. F) Primary Care Provider: Aliene Beams Other Clinician: Referring Provider: Treating Provider/Extender: Karna Dupes in Treatment: 6 Information Obtained From Patient Constitutional Symptoms (General Health) Medical History: Past Medical History Notes: obesity Eyes Medical History: Negative for: Cataracts; Glaucoma; Optic Neuritis Ear/Nose/Mouth/Throat Medical History: Negative for: Chronic sinus problems/congestion; Middle ear problems Hematologic/Lymphatic Medical History: Positive for: Lymphedema Negative for: Anemia; Hemophilia; Human Immunodeficiency Virus; Sickle Cell  CADIENCE, DANH ([id number]) [id number] Page 1 of 11 Visit Report for 05/29/2023 Chief Complaint Document Details Patient Name: Date of Service: SUNDEE, ALKIRE 05/29/2023 2:00 PM Medical Record Number: [id number] Patient Account Number: [id number] Date of Birth/Sex: Treating RN: 1941-04-17 (82 y.o. F) Primary Care Provider: Aliene Beams Other Clinician: Referring Provider: Treating Provider/Extender: Karna Dupes in Treatment: 6 Information Obtained from: Patient Chief Complaint 03/28/2020; patient is here for review of wounds on her bilateral lower legs 12/25/2022; bilateral lower extremity wound 04/15/2023: BLE wounds Electronic Signature(s) Signed: 05/29/2023 3:24:35 PM By: Duanne Guess MD FACS Entered By: Duanne Guess on 05/29/2023 12:24:35 -------------------------------------------------------------------------------- HPI Details Patient Name: Date of Service: Remi Haggard, Jannetta 05/29/2023 2:00 PM Medical Record Number: [id number] Patient Account Number: [id number] Date of Birth/Sex: Treating RN: 1941-06-09 (82 y.o. F) Primary Care Provider: Aliene Beams Other Clinician: Referring Provider: Treating Provider/Extender: Karna Dupes in Treatment: 6 History of Present Illness HPI Description: ADMISSION 03/28/2020 This is a 82 year old woman who is accompanied by her daughter. She is moving to [place] from [place] where she lives. Infectious been going to the wound care center in [place] for the last 2 months. She apparently did not tolerate compression early on in that clinic stay. She is just using dry gauze over the wounds when she came in today. She tells me she has had both arterial and venous reflux studies although she does not have copies of these. She was apparently offered an ablation but I have no further information on that. She also is a type II diabetic. She was noted to have  wounds on her lower extremities during a CHF clinic visit with Dr. Gala Romney and she was referred here. She has several scattered areas on the right medial lower leg and a large area of superficial ulceration on the posterior medial left lower leg. past medical history; chronic kidney disease stage III, gastroesophageal reflux disease, type 2 diabetes with neuropathy, chronic diastolic heart failure, obstructive sleep apnea, hypertension, restless leg syndrome, pulmonary venous hypertension, history of colon CA, pacemaker and apparently venous reflux. We did not do arterial studies in the clinic today we are going to try to get the results that were already done within the last 2 months in [place] 7/26; patient readmitted to the clinic last week with predominantly chronic venous wounds almost circumferentially in the left lower leg and the right leg medially. She also has secondary lymphedema. We put her in compression. She went to the ER in [place] on Friday they remove the wrap on the left leg diagnosed her with cellulitis and put her on doxycycline. They apparently also did a ultrasound that was negative for DVT . We did not get any vascular information from [facility] where she apparently had arterial studies and venous studies. She is currently moving from [place] to [place] is up on her feet quite a bit. She was referred to vein and vascular here but never got to the appointment. ABIs were obtained here at 0.89 on the right and 1.02 on the left 8/9-Patient returns after establishing in clinic last visit, we have been doing 3 layer compression on both sides with calcium alginate, she is following up with the vein and vascular when she gets an appointment. The right side is healed the left side is still got some small open areas She has appt on Wed at Vein and vascular at which point she will come in for Nurse visit here GAILYN, MONZO ([id number])  [id number] Page  05/29/2023 3:30:43 PM By: Duanne Guess MD FACS Entered By: Duanne Guess on 05/29/2023 12:30:43 [name] ([id number]) [id number] Page 3 of 11 -------------------------------------------------------------------------------- Physical Exam Details Patient Name: Date of Service: PORSHE, SANDLIN 05/29/2023 2:00 PM Medical Record Number: [id number] Patient Account Number: [id number] Date of Birth/Sex: Treating RN: [birth year] (82 y.o. F) Primary Care Provider: Aliene Beams Other Clinician: Referring Provider: Treating Provider/Extender: Karna Dupes in Treatment: 6 Constitutional Hypertensive, asymptomatic. . . . Marland Kitchen Respiratory Normal work of breathing  on room air. Notes 05/29/2023: The right leg is completely healed. The left leg has 2 tiny slightly raw-looking areas. Edema control is good. Electronic Signature(s) Signed: 05/29/2023 3:31:37 PM By: Duanne Guess MD FACS Entered By: Duanne Guess on 05/29/2023 12:31:37 -------------------------------------------------------------------------------- Physician Orders Details Patient Name: Date of Service: VELERA, FENDLER 05/29/2023 2:00 PM Medical Record Number: [id number] Patient Account Number: [id number] Date of Birth/Sex: Treating RN: 07-Oct-1940 (82 y.o. Billy Coast, Bonita Quin Primary Care Provider: Aliene Beams Other Clinician: Referring Provider: Treating Provider/Extender: Karna Dupes in Treatment: 6 The following information was scribed by: Zenaida Deed The information was scribed for: Duanne Guess Verbal / Phone Orders: No Diagnosis Coding ICD-10 Coding Code Description L97.811 Non-pressure chronic ulcer of other part of right lower leg limited to breakdown of skin L97.821 Non-pressure chronic ulcer of other part of left lower leg limited to breakdown of skin L84 Corns and callosities I89.0 Lymphedema, not elsewhere classified I87.2 Venous insufficiency (chronic) (peripheral) E11.622 Type 2 diabetes mellitus with other skin ulcer I50.9 Heart failure, unspecified N18.4 Chronic kidney disease, stage 4 (severe) Follow-up Appointments ppointment in 1 week. - Dr. Lady Gary RM 1 Return A Wed 9/25 @ 12:30 pm Other: - Bring compression garments to appointment next week Bathing/ Shower/ Hygiene May shower with protection but do not get wound dressing(s) wet. Protect dressing(s) with water repellant cover (for example, large plastic bag) or a cast cover and may then take shower. Edema Control - Lymphedema / SCD / Other Elevate legs to the level of the heart or above for 30 minutes daily and/or when sitting for 3-4 times a day throughout the  day. TULANI, KOUBA ([id number]) [id number] Page 4 of 11 Avoid standing for long periods of time. Exercise regularly Non Wound Condition Right Lower Extremity pply the following to affected area as directed: - urgo lite A Wound Treatment Wound #10 - Lower Leg Wound Laterality: Left, Medial Peri-Wound Care: Sween Lotion (Moisturizing lotion) 1 x Per Week/30 Days Discharge Instructions: Apply moisturizing lotion as directed Prim Dressing: Maxorb Extra Ag+ Alginate Dressing, 4x4.75 (in/in) 1 x Per Week/30 Days ary Discharge Instructions: Apply to wound bed as instructed Secondary Dressing: ABD Pad, 8x10 1 x Per Week/30 Days Discharge Instructions: Apply over primary dressing as directed. Compression Wrap: Urgo K2 Lite, (equivalent to a 3 layer) two layer compression system, regular 1 x Per Week/30 Days Discharge Instructions: Apply Urgo K2 Lite as directed (alternative to 3 layer compression). Wound #14 - Lower Leg Wound Laterality: Left, Lateral Peri-Wound Care: Sween Lotion (Moisturizing lotion) 1 x Per Week/30 Days Discharge Instructions: Apply moisturizing lotion as directed Prim Dressing: Maxorb Extra Ag+ Alginate Dressing, 4x4.75 (in/in) 1 x Per Week/30 Days ary Discharge Instructions: Apply to wound bed as instructed Secondary Dressing: ABD Pad, 8x10 1 x Per Week/30 Days Discharge Instructions: Apply over primary dressing as directed. Compression Wrap: Urgo K2 Lite, (equivalent to a 3 layer) two layer compression system, regular 1 x Per Week/30 Days Discharge Instructions: Apply Urgo K2  05/29/2023 3:30:43 PM By: Duanne Guess MD FACS Entered By: Duanne Guess on 05/29/2023 12:30:43 [name] ([id number]) [id number] Page 3 of 11 -------------------------------------------------------------------------------- Physical Exam Details Patient Name: Date of Service: PORSHE, SANDLIN 05/29/2023 2:00 PM Medical Record Number: [id number] Patient Account Number: [id number] Date of Birth/Sex: Treating RN: [birth year] (82 y.o. F) Primary Care Provider: Aliene Beams Other Clinician: Referring Provider: Treating Provider/Extender: Karna Dupes in Treatment: 6 Constitutional Hypertensive, asymptomatic. . . . Marland Kitchen Respiratory Normal work of breathing  on room air. Notes 05/29/2023: The right leg is completely healed. The left leg has 2 tiny slightly raw-looking areas. Edema control is good. Electronic Signature(s) Signed: 05/29/2023 3:31:37 PM By: Duanne Guess MD FACS Entered By: Duanne Guess on 05/29/2023 12:31:37 -------------------------------------------------------------------------------- Physician Orders Details Patient Name: Date of Service: VELERA, FENDLER 05/29/2023 2:00 PM Medical Record Number: [id number] Patient Account Number: [id number] Date of Birth/Sex: Treating RN: 07-Oct-1940 (82 y.o. Billy Coast, Bonita Quin Primary Care Provider: Aliene Beams Other Clinician: Referring Provider: Treating Provider/Extender: Karna Dupes in Treatment: 6 The following information was scribed by: Zenaida Deed The information was scribed for: Duanne Guess Verbal / Phone Orders: No Diagnosis Coding ICD-10 Coding Code Description L97.811 Non-pressure chronic ulcer of other part of right lower leg limited to breakdown of skin L97.821 Non-pressure chronic ulcer of other part of left lower leg limited to breakdown of skin L84 Corns and callosities I89.0 Lymphedema, not elsewhere classified I87.2 Venous insufficiency (chronic) (peripheral) E11.622 Type 2 diabetes mellitus with other skin ulcer I50.9 Heart failure, unspecified N18.4 Chronic kidney disease, stage 4 (severe) Follow-up Appointments ppointment in 1 week. - Dr. Lady Gary RM 1 Return A Wed 9/25 @ 12:30 pm Other: - Bring compression garments to appointment next week Bathing/ Shower/ Hygiene May shower with protection but do not get wound dressing(s) wet. Protect dressing(s) with water repellant cover (for example, large plastic bag) or a cast cover and may then take shower. Edema Control - Lymphedema / SCD / Other Elevate legs to the level of the heart or above for 30 minutes daily and/or when sitting for 3-4 times a day throughout the  day. TULANI, KOUBA ([id number]) [id number] Page 4 of 11 Avoid standing for long periods of time. Exercise regularly Non Wound Condition Right Lower Extremity pply the following to affected area as directed: - urgo lite A Wound Treatment Wound #10 - Lower Leg Wound Laterality: Left, Medial Peri-Wound Care: Sween Lotion (Moisturizing lotion) 1 x Per Week/30 Days Discharge Instructions: Apply moisturizing lotion as directed Prim Dressing: Maxorb Extra Ag+ Alginate Dressing, 4x4.75 (in/in) 1 x Per Week/30 Days ary Discharge Instructions: Apply to wound bed as instructed Secondary Dressing: ABD Pad, 8x10 1 x Per Week/30 Days Discharge Instructions: Apply over primary dressing as directed. Compression Wrap: Urgo K2 Lite, (equivalent to a 3 layer) two layer compression system, regular 1 x Per Week/30 Days Discharge Instructions: Apply Urgo K2 Lite as directed (alternative to 3 layer compression). Wound #14 - Lower Leg Wound Laterality: Left, Lateral Peri-Wound Care: Sween Lotion (Moisturizing lotion) 1 x Per Week/30 Days Discharge Instructions: Apply moisturizing lotion as directed Prim Dressing: Maxorb Extra Ag+ Alginate Dressing, 4x4.75 (in/in) 1 x Per Week/30 Days ary Discharge Instructions: Apply to wound bed as instructed Secondary Dressing: ABD Pad, 8x10 1 x Per Week/30 Days Discharge Instructions: Apply over primary dressing as directed. Compression Wrap: Urgo K2 Lite, (equivalent to a 3 layer) two layer compression system, regular 1 x Per Week/30 Days Discharge Instructions: Apply Urgo K2  05/29/2023 3:30:43 PM By: Duanne Guess MD FACS Entered By: Duanne Guess on 05/29/2023 12:30:43 [name] ([id number]) [id number] Page 3 of 11 -------------------------------------------------------------------------------- Physical Exam Details Patient Name: Date of Service: PORSHE, SANDLIN 05/29/2023 2:00 PM Medical Record Number: [id number] Patient Account Number: [id number] Date of Birth/Sex: Treating RN: [birth year] (82 y.o. F) Primary Care Provider: Aliene Beams Other Clinician: Referring Provider: Treating Provider/Extender: Karna Dupes in Treatment: 6 Constitutional Hypertensive, asymptomatic. . . . Marland Kitchen Respiratory Normal work of breathing  on room air. Notes 05/29/2023: The right leg is completely healed. The left leg has 2 tiny slightly raw-looking areas. Edema control is good. Electronic Signature(s) Signed: 05/29/2023 3:31:37 PM By: Duanne Guess MD FACS Entered By: Duanne Guess on 05/29/2023 12:31:37 -------------------------------------------------------------------------------- Physician Orders Details Patient Name: Date of Service: VELERA, FENDLER 05/29/2023 2:00 PM Medical Record Number: [id number] Patient Account Number: [id number] Date of Birth/Sex: Treating RN: 07-Oct-1940 (82 y.o. Billy Coast, Bonita Quin Primary Care Provider: Aliene Beams Other Clinician: Referring Provider: Treating Provider/Extender: Karna Dupes in Treatment: 6 The following information was scribed by: Zenaida Deed The information was scribed for: Duanne Guess Verbal / Phone Orders: No Diagnosis Coding ICD-10 Coding Code Description L97.811 Non-pressure chronic ulcer of other part of right lower leg limited to breakdown of skin L97.821 Non-pressure chronic ulcer of other part of left lower leg limited to breakdown of skin L84 Corns and callosities I89.0 Lymphedema, not elsewhere classified I87.2 Venous insufficiency (chronic) (peripheral) E11.622 Type 2 diabetes mellitus with other skin ulcer I50.9 Heart failure, unspecified N18.4 Chronic kidney disease, stage 4 (severe) Follow-up Appointments ppointment in 1 week. - Dr. Lady Gary RM 1 Return A Wed 9/25 @ 12:30 pm Other: - Bring compression garments to appointment next week Bathing/ Shower/ Hygiene May shower with protection but do not get wound dressing(s) wet. Protect dressing(s) with water repellant cover (for example, large plastic bag) or a cast cover and may then take shower. Edema Control - Lymphedema / SCD / Other Elevate legs to the level of the heart or above for 30 minutes daily and/or when sitting for 3-4 times a day throughout the  day. TULANI, KOUBA ([id number]) [id number] Page 4 of 11 Avoid standing for long periods of time. Exercise regularly Non Wound Condition Right Lower Extremity pply the following to affected area as directed: - urgo lite A Wound Treatment Wound #10 - Lower Leg Wound Laterality: Left, Medial Peri-Wound Care: Sween Lotion (Moisturizing lotion) 1 x Per Week/30 Days Discharge Instructions: Apply moisturizing lotion as directed Prim Dressing: Maxorb Extra Ag+ Alginate Dressing, 4x4.75 (in/in) 1 x Per Week/30 Days ary Discharge Instructions: Apply to wound bed as instructed Secondary Dressing: ABD Pad, 8x10 1 x Per Week/30 Days Discharge Instructions: Apply over primary dressing as directed. Compression Wrap: Urgo K2 Lite, (equivalent to a 3 layer) two layer compression system, regular 1 x Per Week/30 Days Discharge Instructions: Apply Urgo K2 Lite as directed (alternative to 3 layer compression). Wound #14 - Lower Leg Wound Laterality: Left, Lateral Peri-Wound Care: Sween Lotion (Moisturizing lotion) 1 x Per Week/30 Days Discharge Instructions: Apply moisturizing lotion as directed Prim Dressing: Maxorb Extra Ag+ Alginate Dressing, 4x4.75 (in/in) 1 x Per Week/30 Days ary Discharge Instructions: Apply to wound bed as instructed Secondary Dressing: ABD Pad, 8x10 1 x Per Week/30 Days Discharge Instructions: Apply over primary dressing as directed. Compression Wrap: Urgo K2 Lite, (equivalent to a 3 layer) two layer compression system, regular 1 x Per Week/30 Days Discharge Instructions: Apply Urgo K2

## 2023-05-29 NOTE — Progress Notes (Signed)
Point of Measurement: From Medial Instep 26.5 cm 26.5 cm Vascular Assessment Pulses: Dorsalis Pedis Palpable: [Left:Yes] [Right:Yes] Extremity colors, hair growth, and conditions: Extremity Color: [Left:Hyperpigmented] [Right:Hyperpigmented] Hair Growth on Extremity: [Left:No] [Right:No] Temperature of Extremity: [Left:Warm] [Right:Warm] Capillary Refill: [Left:< 3 seconds] [Right:< 3 seconds] Dependent Rubor: [Left:No No] [Right:No No] Electronic Signature(s) Signed: 05/29/2023 5:08:04 PM By: Zenaida Deed RN, BSN Entered By: Zenaida Deed on 05/29/2023 11:37:45 -------------------------------------------------------------------------------- Multi Wound Chart Details Patient Name: Date of Service: Charline Bills 05/29/2023 2:00 PM Medical Record Number: [id number] Patient Account Number: [id number] Date of Birth/Sex: Treating RN: 04/21/1941 (82 y.o. F) Primary Care Marcy Sookdeo: Aliene Beams Other Clinician: Referring Vincenzina Jagoda: Treating  Elver Stadler/Extender: Karna Dupes in Treatment: 6 Vital Signs Height(in): 63 Capillary Blood Glucose(mg/dl): 557 Weight(lbs): 322 Pulse(bpm): 82 Body Mass Index(BMI): 37 Blood Pressure(mmHg): 167/93 Temperature(F): 98 Respiratory Rate(breaths/min): 20 [10:Photos:] Left, Medial Lower Leg Right, Lateral Lower Leg Left, Anterior Lower Leg Wound Location: Gradually Appeared Gradually Appeared Gradually Appeared Wounding Event: Lymphedema Lymphedema Lymphedema Primary Etiology: Diabetic Wound/Ulcer of the Lower Diabetic Wound/Ulcer of the Lower Diabetic Wound/Ulcer of the Lower Secondary Etiology: Extremity Extremity Extremity Lymphedema, Congestive Heart Lymphedema, Congestive Heart Lymphedema, Congestive Heart Comorbid History: Failure, Hypertension, Peripheral Failure, Hypertension, Peripheral Failure, Hypertension, Peripheral Venous Disease, Type II Diabetes, Venous Disease, Type II Diabetes, Venous Disease, Type II Diabetes, Osteoarthritis, Neuropathy, Osteoarthritis, Neuropathy, Osteoarthritis, Neuropathy, Confinement Anxiety Confinement Anxiety Confinement Anxiety 03/25/2023 03/25/2023 05/07/2023 Date Acquired: 6 6 3  Weeks of Treatment: Open Healed - Epithelialized Healed - Epithelialized Wound Status: No No No Wound Recurrence: Sheaffer, Malli ([id number]) [id number] Page 4 of 14 0.1x0.1x0.1 0x0x0 0x0x0 Measurements L x W x D (cm) 0.008 0 0 A (cm) : rea 0.001 0 0 Volume (cm) : 88.70% 100.00% 100.00% % Reduction in Area: 85.70% 100.00% 100.00% % Reduction in Volume: Full Thickness Without Exposed Full Thickness Without Exposed Full Thickness Without Exposed Classification: Support Structures Support Structures Support Structures Small Small None Present Exudate Amount: Serous Serous N/A Exudate Type: Media planner N/A Exudate Color: Indistinct, nonvisible N/A N/A Wound Margin: None Present (0%) None Present (0%)  None Present (0%) Granulation Amount: N/A N/A N/A Granulation Quality: None Present (0%) None Present (0%) None Present (0%) Necrotic Amount: Fascia: No Fascia: No Fascia: No Exposed Structures: Fat Layer (Subcutaneous Tissue): No Fat Layer (Subcutaneous Tissue): No Fat Layer (Subcutaneous Tissue): No Tendon: No Tendon: No Tendon: No Muscle: No Muscle: No Muscle: No Joint: No Joint: No Joint: No Bone: No Bone: No Bone: No Large (67-100%) Large (67-100%) Large (67-100%) Epithelialization: No Abnormalities Noted No Abnormalities Noted Excoriation: No Periwound Skin Texture: No Abnormalities Noted No Abnormalities Noted No Abnormalities Noted Periwound Skin Moisture: Hemosiderin Staining: Yes Hemosiderin Staining: Yes Hemosiderin Staining: Yes Periwound Skin Color: No Abnormality No Abnormality No Abnormality Temperature: Compression Therapy N/A N/A Procedures Performed: Wound Number: [id number] N/A Photos: N/A Left, Posterior Lower Leg Left, Lateral Lower Leg N/A Wound Location: Gradually Appeared Gradually Appeared N/A Wounding Event: Lymphedema Lymphedema N/A Primary Etiology: N/A N/A N/A Secondary Etiology: Lymphedema, Congestive Heart Lymphedema, Congestive Heart N/A Comorbid History: Failure, Hypertension, Peripheral Failure, Hypertension, Peripheral Venous Disease, Type II Diabetes, Venous Disease, Type II Diabetes, Osteoarthritis, Neuropathy, Osteoarthritis, Neuropathy, Confinement Anxiety Confinement Anxiety 05/22/2023 05/22/2023 N/A Date Acquired: 1 1 N/A Weeks of Treatment: Healed - Epithelialized Open N/A Wound Status: No No N/A Wound Recurrence: 0x0x0 0.1x0.1x0.1 N/A Measurements L x W x D (cm) 0 0.008 N/A A (cm) : rea 0 0.001 N/A Volume (cm) : 100.00% 99.20%  visit. Compression as ordered Treatment Activities: Therapeutic compression applied : 04/15/2023 Notes: Wound/Skin Impairment Nursing Diagnoses: Impaired tissue integrity Knowledge deficit related to ulceration/compromised skin integrity Goals: Patient/caregiver will verbalize understanding of skin care regimen Date Initiated: 04/15/2023 Target Resolution Date: 06/17/2023 Goal Status: Active Ulcer/skin breakdown will have a volume reduction of 30% by week 4 Date Initiated: 04/15/2023 Date Inactivated: 05/16/2023 Target Resolution Date: 05/13/2023 Unmet Reason: uncontrolled edema, Goal Status: Unmet fluid overload Ulcer/skin breakdown will have a volume reduction of 50% by week 8 Date Initiated: 05/16/2023 Target Resolution Date: 06/13/2023 Goal Status: Active Interventions: ABISHA, HOWATT ([id number]) [phone].pdf Page 7 of 14 Assess patient/caregiver ability to obtain necessary supplies Assess patient/caregiver ability to perform ulcer/skin care regimen upon admission and as needed Assess ulceration(s) every visit Provide education on ulcer and skin care Treatment Activities: Skin care regimen initiated : 04/15/2023 Topical wound management initiated : 04/15/2023 Notes: Electronic Signature(s) Signed: 05/29/2023 5:08:04 PM By: Zenaida Deed RN, BSN Entered By: Zenaida Deed on 05/29/2023  11:48:56 -------------------------------------------------------------------------------- Pain Assessment Details Patient Name: Date of Service: IASIA, CARKHUFF 05/29/2023 2:00 PM Medical Record Number: [id number] Patient Account Number: [id number] Date of Birth/Sex: Treating RN: 01/17/1941 (82 y.o. [name] Primary Care Nela Bascom: Aliene Beams Other Clinician: Referring Kert Shackett: Treating Vestal Crandall/Extender: Karna Dupes in Treatment: 6 Active Problems Location of Pain Severity and Description of Pain Patient Has Paino No Site Locations Rate the pain. Current Pain Level: 0 Pain Management and Medication Current Pain Management: Electronic Signature(s) Signed: 05/29/2023 5:08:04 PM By: Zenaida Deed RN, BSN Entered By: Zenaida Deed on 05/29/2023 11:32:01 -------------------------------------------------------------------------------- Patient/Caregiver Education Details Patient Name: Date of Service: KAMAREA, FELAND 9/18/2024andnbsp2:00 PM [place], Bosie Clos ([id number]) [id number] Page 8 of 14 Medical Record Number: [id number] Patient Account Number: [id number] Date of Birth/Gender: Treating RN: 02/06/1941 (82 y.o. [name] Primary Care Physician: Aliene Beams Other Clinician: Referring Physician: Treating Physician/Extender: Karna Dupes in Treatment: 6 Education Assessment Education Provided To: Patient Education Topics Provided Elevated Blood Sugar/ Impact on Healing: Methods: Explain/Verbal Responses: Reinforcements needed, State content correctly Venous: Methods: Explain/Verbal Responses: Reinforcements needed, State content correctly Electronic Signature(s) Signed: 05/29/2023 5:08:04 PM By: Zenaida Deed RN, BSN Entered By: Zenaida Deed on 05/29/2023 11:49:19 -------------------------------------------------------------------------------- Wound Assessment  Details Patient Name: Date of Service: ELLYANA, TEWALT 05/29/2023 2:00 PM Medical Record Number: [id number] Patient Account Number: [id number] Date of Birth/Sex: Treating RN: [birth year] (82 y.o. [place], Linda Primary Care Daysy Santini: Aliene Beams Other Clinician: Referring Autry Droege: Treating Acel Natzke/Extender: Karna Dupes in Treatment: 6 Wound Status Wound Number: 10 Primary Lymphedema Etiology: Wound Location: Left, Medial Lower Leg Secondary Diabetic Wound/Ulcer of the Lower Extremity Wounding Event: Gradually Appeared Etiology: Date Acquired: 03/25/2023 Wound Open Weeks Of Treatment: 6 Status: Clustered Wound: No Comorbid Lymphedema, Congestive Heart Failure, Hypertension, Peripheral History: Venous Disease, Type II Diabetes, Osteoarthritis, Neuropathy, Confinement Anxiety Photos Wound Measurements Length: (cm) 0.1 Width: (cm) 0.1 Depth: (cm) 0.1 Area: (cm) 0. Volume: (cm) 0. Bise, Ginnie ([id number]) % Reduction in Area: 88.7% % Reduction in Volume: 85.7% Epithelialization: Large (67-100%) 008 Tunneling: No 001 Undermining: No [id number] Page 9 of 14 Wound Description Classification: Full Thickness Without Exposed Suppor Wound Margin: Indistinct, nonvisible Exudate Amount: Small Exudate Type: Serous Exudate Color: amber t Structures Foul Odor After Cleansing: No Slough/Fibrino No Wound Bed Granulation Amount: None Present (0%) Exposed Structure Necrotic Amount: None Present (0%) Fascia Exposed: No Fat Layer (Subcutaneous Tissue) Exposed: No Tendon Exposed: No Muscle Exposed: No Joint Exposed: No  visit. Compression as ordered Treatment Activities: Therapeutic compression applied : 04/15/2023 Notes: Wound/Skin Impairment Nursing Diagnoses: Impaired tissue integrity Knowledge deficit related to ulceration/compromised skin integrity Goals: Patient/caregiver will verbalize understanding of skin care regimen Date Initiated: 04/15/2023 Target Resolution Date: 06/17/2023 Goal Status: Active Ulcer/skin breakdown will have a volume reduction of 30% by week 4 Date Initiated: 04/15/2023 Date Inactivated: 05/16/2023 Target Resolution Date: 05/13/2023 Unmet Reason: uncontrolled edema, Goal Status: Unmet fluid overload Ulcer/skin breakdown will have a volume reduction of 50% by week 8 Date Initiated: 05/16/2023 Target Resolution Date: 06/13/2023 Goal Status: Active Interventions: ABISHA, HOWATT ([id number]) [phone].pdf Page 7 of 14 Assess patient/caregiver ability to obtain necessary supplies Assess patient/caregiver ability to perform ulcer/skin care regimen upon admission and as needed Assess ulceration(s) every visit Provide education on ulcer and skin care Treatment Activities: Skin care regimen initiated : 04/15/2023 Topical wound management initiated : 04/15/2023 Notes: Electronic Signature(s) Signed: 05/29/2023 5:08:04 PM By: Zenaida Deed RN, BSN Entered By: Zenaida Deed on 05/29/2023  11:48:56 -------------------------------------------------------------------------------- Pain Assessment Details Patient Name: Date of Service: IASIA, CARKHUFF 05/29/2023 2:00 PM Medical Record Number: [id number] Patient Account Number: [id number] Date of Birth/Sex: Treating RN: 01/17/1941 (82 y.o. [name] Primary Care Nela Bascom: Aliene Beams Other Clinician: Referring Kert Shackett: Treating Vestal Crandall/Extender: Karna Dupes in Treatment: 6 Active Problems Location of Pain Severity and Description of Pain Patient Has Paino No Site Locations Rate the pain. Current Pain Level: 0 Pain Management and Medication Current Pain Management: Electronic Signature(s) Signed: 05/29/2023 5:08:04 PM By: Zenaida Deed RN, BSN Entered By: Zenaida Deed on 05/29/2023 11:32:01 -------------------------------------------------------------------------------- Patient/Caregiver Education Details Patient Name: Date of Service: KAMAREA, FELAND 9/18/2024andnbsp2:00 PM [place], Bosie Clos ([id number]) [id number] Page 8 of 14 Medical Record Number: [id number] Patient Account Number: [id number] Date of Birth/Gender: Treating RN: 02/06/1941 (82 y.o. [name] Primary Care Physician: Aliene Beams Other Clinician: Referring Physician: Treating Physician/Extender: Karna Dupes in Treatment: 6 Education Assessment Education Provided To: Patient Education Topics Provided Elevated Blood Sugar/ Impact on Healing: Methods: Explain/Verbal Responses: Reinforcements needed, State content correctly Venous: Methods: Explain/Verbal Responses: Reinforcements needed, State content correctly Electronic Signature(s) Signed: 05/29/2023 5:08:04 PM By: Zenaida Deed RN, BSN Entered By: Zenaida Deed on 05/29/2023 11:49:19 -------------------------------------------------------------------------------- Wound Assessment  Details Patient Name: Date of Service: ELLYANA, TEWALT 05/29/2023 2:00 PM Medical Record Number: [id number] Patient Account Number: [id number] Date of Birth/Sex: Treating RN: [birth year] (82 y.o. [place], Linda Primary Care Daysy Santini: Aliene Beams Other Clinician: Referring Autry Droege: Treating Acel Natzke/Extender: Karna Dupes in Treatment: 6 Wound Status Wound Number: 10 Primary Lymphedema Etiology: Wound Location: Left, Medial Lower Leg Secondary Diabetic Wound/Ulcer of the Lower Extremity Wounding Event: Gradually Appeared Etiology: Date Acquired: 03/25/2023 Wound Open Weeks Of Treatment: 6 Status: Clustered Wound: No Comorbid Lymphedema, Congestive Heart Failure, Hypertension, Peripheral History: Venous Disease, Type II Diabetes, Osteoarthritis, Neuropathy, Confinement Anxiety Photos Wound Measurements Length: (cm) 0.1 Width: (cm) 0.1 Depth: (cm) 0.1 Area: (cm) 0. Volume: (cm) 0. Bise, Ginnie ([id number]) % Reduction in Area: 88.7% % Reduction in Volume: 85.7% Epithelialization: Large (67-100%) 008 Tunneling: No 001 Undermining: No [id number] Page 9 of 14 Wound Description Classification: Full Thickness Without Exposed Suppor Wound Margin: Indistinct, nonvisible Exudate Amount: Small Exudate Type: Serous Exudate Color: amber t Structures Foul Odor After Cleansing: No Slough/Fibrino No Wound Bed Granulation Amount: None Present (0%) Exposed Structure Necrotic Amount: None Present (0%) Fascia Exposed: No Fat Layer (Subcutaneous Tissue) Exposed: No Tendon Exposed: No Muscle Exposed: No Joint Exposed: No  N/A % Reduction in Area: 100.00% 99.10% N/A % Reduction in Volume: Full Thickness Without Exposed Full Thickness Without Exposed N/A Classification: Support Structures Support  Structures None Present Small N/A Exudate Amount: N/A Serous N/A Exudate Type: N/A amber N/A Exudate Color: N/A Flat and Intact N/A Wound Margin: None Present (0%) Small (1-33%) N/A Granulation Amount: N/A Red N/A Granulation Quality: None Present (0%) None Present (0%) N/A Necrotic Amount: Fascia: No Fat Layer (Subcutaneous Tissue): Yes N/A Exposed Structures: Fat Layer (Subcutaneous Tissue): No Fascia: No Tendon: No Tendon: No Muscle: No Muscle: No Joint: No Joint: No Bone: No Bone: No Large (67-100%) Large (67-100%) N/A Epithelialization: No Abnormalities Noted No Abnormalities Noted N/A Periwound Skin Texture: No Abnormalities Noted No Abnormalities Noted N/A Periwound Skin Moisture: Hemosiderin Staining: Yes Hemosiderin Staining: Yes N/A Periwound Skin Color: No Abnormality No Abnormality N/A Temperature: N/A N/A N/A Procedures Performed: Treatment Notes Wound #10 (Lower Leg) Wound Laterality: Left, Medial Cleanser Peri-Wound Care [place], Bosie Clos ([id number]) [id number] Page 5 of 14 Sween Lotion (Moisturizing lotion) Discharge Instruction: Apply moisturizing lotion as directed Topical Primary Dressing Maxorb Extra Ag+ Alginate Dressing, 4x4.75 (in/in) Discharge Instruction: Apply to wound bed as instructed Secondary Dressing ABD Pad, 8x10 Discharge Instruction: Apply over primary dressing as directed. Secured With Compression Wrap Urgo K2 Lite, (equivalent to a 3 layer) two layer compression system, regular Discharge Instruction: Apply Urgo K2 Lite as directed (alternative to 3 layer compression). Compression Stockings Add-Ons Wound #14 (Lower Leg) Wound Laterality: Left, Lateral Cleanser Peri-Wound Care Sween Lotion (Moisturizing lotion) Discharge Instruction: Apply moisturizing lotion as directed Topical Primary Dressing Maxorb Extra Ag+ Alginate Dressing, 4x4.75 (in/in) Discharge Instruction: Apply to wound bed as  instructed Secondary Dressing ABD Pad, 8x10 Discharge Instruction: Apply over primary dressing as directed. Secured With Compression Wrap Urgo K2 Lite, (equivalent to a 3 layer) two layer compression system, regular Discharge Instruction: Apply Urgo K2 Lite as directed (alternative to 3 layer compression). Compression Stockings Add-Ons Electronic Signature(s) Signed: 05/29/2023 3:24:27 PM By: Duanne Guess MD FACS Entered By: Duanne Guess on 05/29/2023 12:24:26 -------------------------------------------------------------------------------- Multi-Disciplinary Care Plan Details Patient Name: Date of Service: SAMERAH, [name] 05/29/2023 2:00 PM Medical Record Number: [id number] Patient Account Number: [id number] Date of Birth/Sex: Treating RN: [birth year] (82 y.o. [name] Primary Care Cruzito Standre: Aliene Beams Other Clinician: Referring Emersynn Deatley: Treating Eavan Gonterman/Extender: Karna Dupes in Treatment: 6 Multidisciplinary Care Plan reviewed with physician LUSILA, MATSUO ([id number]) [id number] Page 6 of 14 Active Inactive Abuse / Safety / Falls / Self Care Management Nursing Diagnoses: Impaired physical mobility Potential for falls Goals: Patient/caregiver will verbalize/demonstrate measures taken to prevent injury and/or falls Date Initiated: 04/15/2023 Target Resolution Date: 06/17/2023 Goal Status: Active Interventions: Assess fall risk on admission and as needed Assess impairment of mobility on admission and as needed per policy Notes: Nutrition Nursing Diagnoses: Impaired glucose control: actual or potential Potential for alteratiion in Nutrition/Potential for imbalanced nutrition Goals: Patient/caregiver will maintain therapeutic glucose control Date Initiated: 04/15/2023 Target Resolution Date: 06/17/2023 Goal Status: Active Interventions: Assess HgA1c results as ordered upon admission and as needed Assess  patient nutrition upon admission and as needed per policy Provide education on elevated blood sugars and impact on wound healing Treatment Activities: Patient referred to Primary Care Physician for further nutritional evaluation : 04/15/2023 Notes: Venous Leg Ulcer Nursing Diagnoses: Potential for venous Insuffiency (use before diagnosis confirmed) Goals: Patient will maintain optimal edema control Date Initiated: 04/15/2023 Target Resolution Date: 06/17/2023 Goal Status: Active Interventions: Assess peripheral edema status every  visit. Compression as ordered Treatment Activities: Therapeutic compression applied : 04/15/2023 Notes: Wound/Skin Impairment Nursing Diagnoses: Impaired tissue integrity Knowledge deficit related to ulceration/compromised skin integrity Goals: Patient/caregiver will verbalize understanding of skin care regimen Date Initiated: 04/15/2023 Target Resolution Date: 06/17/2023 Goal Status: Active Ulcer/skin breakdown will have a volume reduction of 30% by week 4 Date Initiated: 04/15/2023 Date Inactivated: 05/16/2023 Target Resolution Date: 05/13/2023 Unmet Reason: uncontrolled edema, Goal Status: Unmet fluid overload Ulcer/skin breakdown will have a volume reduction of 50% by week 8 Date Initiated: 05/16/2023 Target Resolution Date: 06/13/2023 Goal Status: Active Interventions: ABISHA, HOWATT ([id number]) [phone].pdf Page 7 of 14 Assess patient/caregiver ability to obtain necessary supplies Assess patient/caregiver ability to perform ulcer/skin care regimen upon admission and as needed Assess ulceration(s) every visit Provide education on ulcer and skin care Treatment Activities: Skin care regimen initiated : 04/15/2023 Topical wound management initiated : 04/15/2023 Notes: Electronic Signature(s) Signed: 05/29/2023 5:08:04 PM By: Zenaida Deed RN, BSN Entered By: Zenaida Deed on 05/29/2023  11:48:56 -------------------------------------------------------------------------------- Pain Assessment Details Patient Name: Date of Service: IASIA, CARKHUFF 05/29/2023 2:00 PM Medical Record Number: [id number] Patient Account Number: [id number] Date of Birth/Sex: Treating RN: 01/17/1941 (82 y.o. [name] Primary Care Nela Bascom: Aliene Beams Other Clinician: Referring Kert Shackett: Treating Vestal Crandall/Extender: Karna Dupes in Treatment: 6 Active Problems Location of Pain Severity and Description of Pain Patient Has Paino No Site Locations Rate the pain. Current Pain Level: 0 Pain Management and Medication Current Pain Management: Electronic Signature(s) Signed: 05/29/2023 5:08:04 PM By: Zenaida Deed RN, BSN Entered By: Zenaida Deed on 05/29/2023 11:32:01 -------------------------------------------------------------------------------- Patient/Caregiver Education Details Patient Name: Date of Service: KAMAREA, FELAND 9/18/2024andnbsp2:00 PM [place], Bosie Clos ([id number]) [id number] Page 8 of 14 Medical Record Number: [id number] Patient Account Number: [id number] Date of Birth/Gender: Treating RN: 02/06/1941 (82 y.o. [name] Primary Care Physician: Aliene Beams Other Clinician: Referring Physician: Treating Physician/Extender: Karna Dupes in Treatment: 6 Education Assessment Education Provided To: Patient Education Topics Provided Elevated Blood Sugar/ Impact on Healing: Methods: Explain/Verbal Responses: Reinforcements needed, State content correctly Venous: Methods: Explain/Verbal Responses: Reinforcements needed, State content correctly Electronic Signature(s) Signed: 05/29/2023 5:08:04 PM By: Zenaida Deed RN, BSN Entered By: Zenaida Deed on 05/29/2023 11:49:19 -------------------------------------------------------------------------------- Wound Assessment  Details Patient Name: Date of Service: ELLYANA, TEWALT 05/29/2023 2:00 PM Medical Record Number: [id number] Patient Account Number: [id number] Date of Birth/Sex: Treating RN: [birth year] (82 y.o. [place], Linda Primary Care Daysy Santini: Aliene Beams Other Clinician: Referring Autry Droege: Treating Acel Natzke/Extender: Karna Dupes in Treatment: 6 Wound Status Wound Number: 10 Primary Lymphedema Etiology: Wound Location: Left, Medial Lower Leg Secondary Diabetic Wound/Ulcer of the Lower Extremity Wounding Event: Gradually Appeared Etiology: Date Acquired: 03/25/2023 Wound Open Weeks Of Treatment: 6 Status: Clustered Wound: No Comorbid Lymphedema, Congestive Heart Failure, Hypertension, Peripheral History: Venous Disease, Type II Diabetes, Osteoarthritis, Neuropathy, Confinement Anxiety Photos Wound Measurements Length: (cm) 0.1 Width: (cm) 0.1 Depth: (cm) 0.1 Area: (cm) 0. Volume: (cm) 0. Bise, Ginnie ([id number]) % Reduction in Area: 88.7% % Reduction in Volume: 85.7% Epithelialization: Large (67-100%) 008 Tunneling: No 001 Undermining: No [id number] Page 9 of 14 Wound Description Classification: Full Thickness Without Exposed Suppor Wound Margin: Indistinct, nonvisible Exudate Amount: Small Exudate Type: Serous Exudate Color: amber t Structures Foul Odor After Cleansing: No Slough/Fibrino No Wound Bed Granulation Amount: None Present (0%) Exposed Structure Necrotic Amount: None Present (0%) Fascia Exposed: No Fat Layer (Subcutaneous Tissue) Exposed: No Tendon Exposed: No Muscle Exposed: No Joint Exposed: No  Exposed: No Joint Exposed: No Bone Exposed: No Periwound Skin Texture Texture Color No Abnormalities Noted: Yes No Abnormalities Noted: No Hemosiderin Staining: Yes Moisture No Abnormalities Noted: Yes Temperature / Pain Temperature: No Abnormality Electronic Signature(s) Signed: 05/29/2023 5:08:04 PM By: Zenaida Deed RN, BSN Entered By: Zenaida Deed on 05/29/2023 11:47:45 -------------------------------------------------------------------------------- Wound Assessment Details Patient Name: Date of Service: TINSLEE, DEROCCO 05/29/2023 2:00 PM Medical Record Number: [id number] Patient Account Number: [id number] Date of Birth/Sex: Treating RN: October 13, 1940 (82 y.o. [name] Primary Care Felissa Blouch: Aliene Beams Other Clinician: Referring Nickolas [place]: Treating Suzan Manon/Extender: Gracy Bruins [place], Bosie Clos ([id number]) [id number] Page 12 of 14 Weeks in Treatment: 6 Wound Status Wound Number: 13 Primary Lymphedema Etiology: Wound Location: Left, Posterior Lower Leg Wound Healed - Epithelialized Wounding Event: Gradually Appeared Status: Date Acquired: 05/22/2023 Comorbid Lymphedema, Congestive Heart Failure, Hypertension, Peripheral Weeks Of Treatment: 1 History: Venous Disease, Type II Diabetes, Osteoarthritis, Neuropathy, Clustered Wound: No Confinement Anxiety Photos Wound  Measurements Length: (cm) Width: (cm) Depth: (cm) Area: (cm) Volume: (cm) 0 % Reduction in Area: 100% 0 % Reduction in Volume: 100% 0 Epithelialization: Large (67-100%) 0 Tunneling: No 0 Undermining: No Wound Description Classification: Full Thickness Without Exposed Support Structures Exudate Amount: None Present Foul Odor After Cleansing: No Slough/Fibrino No Wound Bed Granulation Amount: None Present (0%) Exposed Structure Necrotic Amount: None Present (0%) Fascia Exposed: No Fat Layer (Subcutaneous Tissue) Exposed: No Tendon Exposed: No Muscle Exposed: No Joint Exposed: No Bone Exposed: No Periwound Skin Texture Texture Color No Abnormalities Noted: Yes No Abnormalities Noted: No Hemosiderin Staining: Yes Moisture No Abnormalities Noted: Yes Temperature / Pain Temperature: No Abnormality Electronic Signature(s) Signed: 05/29/2023 5:08:04 PM By: Zenaida Deed RN, BSN Entered By: Zenaida Deed on 05/29/2023 11:48:16 -------------------------------------------------------------------------------- Wound Assessment Details Patient Name: Date of Service: YTZEL, SINGELTON 05/29/2023 2:00 PM Medical Record Number: [id number] Patient Account Number: [id number] Date of Birth/Sex: Treating RN: 02/01/1941 (82 y.o. [name] Primary Care Octavia Velador: Aliene Beams Other Clinician: Referring Madalina Rosman: Treating Maritsa Hunsucker/Extender: Karna Dupes in Treatment: 6 [place], [place] ([id number]) [id number] Page 13 of 14 Wound Status Wound Number: 14 Primary Lymphedema Etiology: Wound Location: Left, Lateral Lower Leg Wound Open Wounding Event: Gradually Appeared Status: Date Acquired: 05/22/2023 Comorbid Lymphedema, Congestive Heart Failure, Hypertension, Peripheral Weeks Of Treatment: 1 History: Venous Disease, Type II Diabetes, Osteoarthritis, Neuropathy, Clustered Wound: No Confinement Anxiety Photos Wound  Measurements Length: (cm) 0.1 Width: (cm) 0.1 Depth: (cm) 0.1 Area: (cm) 0.008 Volume: (cm) 0.001 % Reduction in Area: 99.2% % Reduction in Volume: 99.1% Epithelialization: Large (67-100%) Tunneling: No Undermining: No Wound Description Classification: Full Thickness Without Exposed Suppor Wound Margin: Flat and Intact Exudate Amount: Small Exudate Type: Serous Exudate Color: amber t Structures Foul Odor After Cleansing: No Slough/Fibrino Yes Wound Bed Granulation Amount: Small (1-33%) Exposed Structure Granulation Quality: Red Fascia Exposed: No Necrotic Amount: None Present (0%) Fat Layer (Subcutaneous Tissue) Exposed: Yes Tendon Exposed: No Muscle Exposed: No Joint Exposed: No Bone Exposed: No Periwound Skin Texture Texture Color No Abnormalities Noted: Yes No Abnormalities Noted: No Hemosiderin Staining: Yes Moisture No Abnormalities Noted: Yes Temperature / Pain Temperature: No Abnormality Treatment Notes Wound #14 (Lower Leg) Wound Laterality: Left, Lateral Cleanser Peri-Wound Care Sween Lotion (Moisturizing lotion) Discharge Instruction: Apply moisturizing lotion as directed Topical Primary Dressing Maxorb Extra Ag+ Alginate Dressing, 4x4.75 (in/in) Discharge Instruction: Apply to wound bed as instructed Secondary Dressing ABD Pad, 8x10 Discharge Instruction: Apply over primary dressing as directed. CHRISHANA, ZWIEG ([id number]) [id number] Page  N/A % Reduction in Area: 100.00% 99.10% N/A % Reduction in Volume: Full Thickness Without Exposed Full Thickness Without Exposed N/A Classification: Support Structures Support  Structures None Present Small N/A Exudate Amount: N/A Serous N/A Exudate Type: N/A amber N/A Exudate Color: N/A Flat and Intact N/A Wound Margin: None Present (0%) Small (1-33%) N/A Granulation Amount: N/A Red N/A Granulation Quality: None Present (0%) None Present (0%) N/A Necrotic Amount: Fascia: No Fat Layer (Subcutaneous Tissue): Yes N/A Exposed Structures: Fat Layer (Subcutaneous Tissue): No Fascia: No Tendon: No Tendon: No Muscle: No Muscle: No Joint: No Joint: No Bone: No Bone: No Large (67-100%) Large (67-100%) N/A Epithelialization: No Abnormalities Noted No Abnormalities Noted N/A Periwound Skin Texture: No Abnormalities Noted No Abnormalities Noted N/A Periwound Skin Moisture: Hemosiderin Staining: Yes Hemosiderin Staining: Yes N/A Periwound Skin Color: No Abnormality No Abnormality N/A Temperature: N/A N/A N/A Procedures Performed: Treatment Notes Wound #10 (Lower Leg) Wound Laterality: Left, Medial Cleanser Peri-Wound Care [place], Bosie Clos ([id number]) [id number] Page 5 of 14 Sween Lotion (Moisturizing lotion) Discharge Instruction: Apply moisturizing lotion as directed Topical Primary Dressing Maxorb Extra Ag+ Alginate Dressing, 4x4.75 (in/in) Discharge Instruction: Apply to wound bed as instructed Secondary Dressing ABD Pad, 8x10 Discharge Instruction: Apply over primary dressing as directed. Secured With Compression Wrap Urgo K2 Lite, (equivalent to a 3 layer) two layer compression system, regular Discharge Instruction: Apply Urgo K2 Lite as directed (alternative to 3 layer compression). Compression Stockings Add-Ons Wound #14 (Lower Leg) Wound Laterality: Left, Lateral Cleanser Peri-Wound Care Sween Lotion (Moisturizing lotion) Discharge Instruction: Apply moisturizing lotion as directed Topical Primary Dressing Maxorb Extra Ag+ Alginate Dressing, 4x4.75 (in/in) Discharge Instruction: Apply to wound bed as  instructed Secondary Dressing ABD Pad, 8x10 Discharge Instruction: Apply over primary dressing as directed. Secured With Compression Wrap Urgo K2 Lite, (equivalent to a 3 layer) two layer compression system, regular Discharge Instruction: Apply Urgo K2 Lite as directed (alternative to 3 layer compression). Compression Stockings Add-Ons Electronic Signature(s) Signed: 05/29/2023 3:24:27 PM By: Duanne Guess MD FACS Entered By: Duanne Guess on 05/29/2023 12:24:26 -------------------------------------------------------------------------------- Multi-Disciplinary Care Plan Details Patient Name: Date of Service: SAMERAH, [name] 05/29/2023 2:00 PM Medical Record Number: [id number] Patient Account Number: [id number] Date of Birth/Sex: Treating RN: [birth year] (82 y.o. [name] Primary Care Cruzito Standre: Aliene Beams Other Clinician: Referring Emersynn Deatley: Treating Eavan Gonterman/Extender: Karna Dupes in Treatment: 6 Multidisciplinary Care Plan reviewed with physician LUSILA, MATSUO ([id number]) [id number] Page 6 of 14 Active Inactive Abuse / Safety / Falls / Self Care Management Nursing Diagnoses: Impaired physical mobility Potential for falls Goals: Patient/caregiver will verbalize/demonstrate measures taken to prevent injury and/or falls Date Initiated: 04/15/2023 Target Resolution Date: 06/17/2023 Goal Status: Active Interventions: Assess fall risk on admission and as needed Assess impairment of mobility on admission and as needed per policy Notes: Nutrition Nursing Diagnoses: Impaired glucose control: actual or potential Potential for alteratiion in Nutrition/Potential for imbalanced nutrition Goals: Patient/caregiver will maintain therapeutic glucose control Date Initiated: 04/15/2023 Target Resolution Date: 06/17/2023 Goal Status: Active Interventions: Assess HgA1c results as ordered upon admission and as needed Assess  patient nutrition upon admission and as needed per policy Provide education on elevated blood sugars and impact on wound healing Treatment Activities: Patient referred to Primary Care Physician for further nutritional evaluation : 04/15/2023 Notes: Venous Leg Ulcer Nursing Diagnoses: Potential for venous Insuffiency (use before diagnosis confirmed) Goals: Patient will maintain optimal edema control Date Initiated: 04/15/2023 Target Resolution Date: 06/17/2023 Goal Status: Active Interventions: Assess peripheral edema status every

## 2023-06-05 ENCOUNTER — Encounter (HOSPITAL_BASED_OUTPATIENT_CLINIC_OR_DEPARTMENT_OTHER): Payer: Medicare Other | Admitting: General Surgery

## 2023-06-05 DIAGNOSIS — I130 Hypertensive heart and chronic kidney disease with heart failure and stage 1 through stage 4 chronic kidney disease, or unspecified chronic kidney disease: Secondary | ICD-10-CM | POA: Diagnosis not present

## 2023-06-05 DIAGNOSIS — N184 Chronic kidney disease, stage 4 (severe): Secondary | ICD-10-CM | POA: Diagnosis not present

## 2023-06-05 DIAGNOSIS — I272 Pulmonary hypertension, unspecified: Secondary | ICD-10-CM | POA: Diagnosis not present

## 2023-06-05 DIAGNOSIS — G4733 Obstructive sleep apnea (adult) (pediatric): Secondary | ICD-10-CM | POA: Diagnosis not present

## 2023-06-05 DIAGNOSIS — E1151 Type 2 diabetes mellitus with diabetic peripheral angiopathy without gangrene: Secondary | ICD-10-CM | POA: Diagnosis not present

## 2023-06-05 DIAGNOSIS — E1142 Type 2 diabetes mellitus with diabetic polyneuropathy: Secondary | ICD-10-CM | POA: Diagnosis not present

## 2023-06-05 DIAGNOSIS — L97811 Non-pressure chronic ulcer of other part of right lower leg limited to breakdown of skin: Secondary | ICD-10-CM | POA: Diagnosis not present

## 2023-06-05 DIAGNOSIS — E11622 Type 2 diabetes mellitus with other skin ulcer: Secondary | ICD-10-CM | POA: Diagnosis not present

## 2023-06-05 DIAGNOSIS — L84 Corns and callosities: Secondary | ICD-10-CM | POA: Diagnosis not present

## 2023-06-05 DIAGNOSIS — L97821 Non-pressure chronic ulcer of other part of left lower leg limited to breakdown of skin: Secondary | ICD-10-CM | POA: Diagnosis not present

## 2023-06-05 DIAGNOSIS — I5032 Chronic diastolic (congestive) heart failure: Secondary | ICD-10-CM | POA: Diagnosis not present

## 2023-06-05 DIAGNOSIS — E1169 Type 2 diabetes mellitus with other specified complication: Secondary | ICD-10-CM | POA: Diagnosis not present

## 2023-06-05 DIAGNOSIS — E1122 Type 2 diabetes mellitus with diabetic chronic kidney disease: Secondary | ICD-10-CM | POA: Diagnosis not present

## 2023-06-05 DIAGNOSIS — I890 Lymphedema, not elsewhere classified: Secondary | ICD-10-CM | POA: Diagnosis not present

## 2023-06-05 DIAGNOSIS — I872 Venous insufficiency (chronic) (peripheral): Secondary | ICD-10-CM | POA: Diagnosis not present

## 2023-06-05 DIAGNOSIS — Z833 Family history of diabetes mellitus: Secondary | ICD-10-CM | POA: Diagnosis not present

## 2023-06-05 NOTE — Progress Notes (Addendum)
Wound Location: Gradually Appeared Gradually Appeared N/A Wounding Event: Lymphedema Lymphedema N/A Primary Etiology: Diabetic Wound/Ulcer of the Lower N/A N/A Secondary Etiology: Extremity Lymphedema, Congestive Heart Lymphedema, Congestive Heart N/A Comorbid History: Failure, Hypertension, Peripheral Failure, Hypertension, Peripheral Venous Disease, Type II Diabetes, Venous Disease, Type II Diabetes, Osteoarthritis, Neuropathy, Osteoarthritis, Neuropathy, Confinement Anxiety Confinement Anxiety 03/25/2023 05/22/2023 N/A Date Acquired: 7 2 N/A Weeks of Treatment: Healed - Epithelialized Healed - Epithelialized N/A Wound Status: No No N/A Wound Recurrence: 0x0x0 0x0x0 N/A Measurements L x W x D (cm) 0 0 N/A A (cm) : rea 0 0 N/A Volume (cm) : 100.00% 100.00% N/A % Reduction in Area: 100.00% 100.00% N/A % Reduction in Volume: Full Thickness Without Exposed Full Thickness Without Exposed N/A Classification: Support Structures Support Structures None Present None Present N/A Exudate Amount: None Present (0%) None Present (0%) N/A Granulation Amount: None Present (0%) None Present (0%) N/A Necrotic Amount: Savage,  Bosie Clos ([id number]) [id number] Page 5 of 9 Fascia: No Fascia: No N/A Exposed Structures: Fat Layer (Subcutaneous Tissue): No Fat Layer (Subcutaneous Tissue): No Tendon: No Tendon: No Muscle: No Muscle: No Joint: No Joint: No Bone: No Bone: No Large (67-100%) Large (67-100%) N/A Epithelialization: No Abnormalities Noted No Abnormalities Noted N/A Periwound Skin Texture: No Abnormalities Noted No Abnormalities Noted N/A Periwound Skin Moisture: Hemosiderin Staining: Yes Hemosiderin Staining: Yes N/A Periwound Skin Color: No Abnormality No Abnormality N/A Temperature: Treatment Notes Electronic Signature(s) Signed: 06/05/2023 1:02:00 PM By: [name] Guess MD FACS Entered By: [name] on 06/05/2023 10:01:59 -------------------------------------------------------------------------------- Multi-Disciplinary Care Plan Details Patient Name: Date of Service: [name], [name] 06/05/2023 12:30 PM Medical Record Number: [id number] Patient Account Number: [id number] Date of Birth/Sex: Treating RN: 1940-09-14 (82 y.o. [name] Primary Care [name]: [name] Other Clinician: Referring Jarely Juncaj: Treating [name]/Extender: [name] in Treatment: 7 Multidisciplinary Care Plan reviewed with physician Active Inactive Electronic Signature(s) Signed: 06/05/2023 4:19:45 PM By: [name] Deed RN, BSN Entered By: [name] on 06/05/2023 10:26:54 -------------------------------------------------------------------------------- Pain Assessment Details Patient Name: Date of Service: [name], [name] 06/05/2023 12:30 PM Medical Record Number: [id number] Patient Account Number: [id number] Date of Birth/Sex: Treating RN: 05-19-1941 (82 y.o. [name] Primary Care [name]: [name] Other Clinician: Referring Daneka Lantigua: Treating [name]/Extender: [name] in Treatment:  7 Active Problems Location of Pain Severity and Description of Pain Patient Has Paino No Site Locations Rate the pain. [name], [name] ([id number]) [id number] Page 6 of 9 Rate the pain. Current Pain Level: 0 Character of Pain Describe the Pain: Tender Pain Management and Medication Current Pain Management: Electronic Signature(s) Signed: 06/05/2023 4:19:45 PM By: [name] Deed RN, BSN Entered By: [name] on 06/05/2023 09:52:36 -------------------------------------------------------------------------------- Patient/Caregiver Education Details Patient Name: Date of Service: [name] 9/25/2024andnbsp12:30 PM Medical Record Number: [id number] Patient Account Number: [id number] Date of Birth/Gender: Treating RN: 1940/11/09 (82 y.o. [name] Primary Care Physician: [name] Other Clinician: Referring Physician: Treating Physician/Extender: [name] in Treatment: 7 Education Assessment Education Provided To: Patient Education Topics Provided Venous: Methods: Explain/Verbal Responses: Reinforcements needed, State content correctly Wound/Skin Impairment: Methods: Explain/Verbal Responses: Reinforcements needed, State content correctly Electronic Signature(s) Signed: 06/05/2023 4:19:45 PM By: [name] Deed RN, BSN Entered By: [name] on 06/05/2023 09:41:39 [name] ([id number]) [id number] Page 7 of 9 -------------------------------------------------------------------------------- Wound Assessment Details Patient Name: Date of Service: [name], [name] 06/05/2023 12:30 PM Medical Record Number: [id number] Patient Account Number: [id number] Date of Birth/Sex: Treating RN: 1940-10-08 (82 y.o. F)  Wound Location: Gradually Appeared Gradually Appeared N/A Wounding Event: Lymphedema Lymphedema N/A Primary Etiology: Diabetic Wound/Ulcer of the Lower N/A N/A Secondary Etiology: Extremity Lymphedema, Congestive Heart Lymphedema, Congestive Heart N/A Comorbid History: Failure, Hypertension, Peripheral Failure, Hypertension, Peripheral Venous Disease, Type II Diabetes, Venous Disease, Type II Diabetes, Osteoarthritis, Neuropathy, Osteoarthritis, Neuropathy, Confinement Anxiety Confinement Anxiety 03/25/2023 05/22/2023 N/A Date Acquired: 7 2 N/A Weeks of Treatment: Healed - Epithelialized Healed - Epithelialized N/A Wound Status: No No N/A Wound Recurrence: 0x0x0 0x0x0 N/A Measurements L x W x D (cm) 0 0 N/A A (cm) : rea 0 0 N/A Volume (cm) : 100.00% 100.00% N/A % Reduction in Area: 100.00% 100.00% N/A % Reduction in Volume: Full Thickness Without Exposed Full Thickness Without Exposed N/A Classification: Support Structures Support Structures None Present None Present N/A Exudate Amount: None Present (0%) None Present (0%) N/A Granulation Amount: None Present (0%) None Present (0%) N/A Necrotic Amount: Savage,  Bosie Clos ([id number]) [id number] Page 5 of 9 Fascia: No Fascia: No N/A Exposed Structures: Fat Layer (Subcutaneous Tissue): No Fat Layer (Subcutaneous Tissue): No Tendon: No Tendon: No Muscle: No Muscle: No Joint: No Joint: No Bone: No Bone: No Large (67-100%) Large (67-100%) N/A Epithelialization: No Abnormalities Noted No Abnormalities Noted N/A Periwound Skin Texture: No Abnormalities Noted No Abnormalities Noted N/A Periwound Skin Moisture: Hemosiderin Staining: Yes Hemosiderin Staining: Yes N/A Periwound Skin Color: No Abnormality No Abnormality N/A Temperature: Treatment Notes Electronic Signature(s) Signed: 06/05/2023 1:02:00 PM By: [name] Guess MD FACS Entered By: [name] on 06/05/2023 10:01:59 -------------------------------------------------------------------------------- Multi-Disciplinary Care Plan Details Patient Name: Date of Service: [name], [name] 06/05/2023 12:30 PM Medical Record Number: [id number] Patient Account Number: [id number] Date of Birth/Sex: Treating RN: 1940-09-14 (82 y.o. [name] Primary Care [name]: [name] Other Clinician: Referring Jarely Juncaj: Treating [name]/Extender: [name] in Treatment: 7 Multidisciplinary Care Plan reviewed with physician Active Inactive Electronic Signature(s) Signed: 06/05/2023 4:19:45 PM By: [name] Deed RN, BSN Entered By: [name] on 06/05/2023 10:26:54 -------------------------------------------------------------------------------- Pain Assessment Details Patient Name: Date of Service: [name], [name] 06/05/2023 12:30 PM Medical Record Number: [id number] Patient Account Number: [id number] Date of Birth/Sex: Treating RN: 05-19-1941 (82 y.o. [name] Primary Care [name]: [name] Other Clinician: Referring Daneka Lantigua: Treating [name]/Extender: [name] in Treatment:  7 Active Problems Location of Pain Severity and Description of Pain Patient Has Paino No Site Locations Rate the pain. [name], [name] ([id number]) [id number] Page 6 of 9 Rate the pain. Current Pain Level: 0 Character of Pain Describe the Pain: Tender Pain Management and Medication Current Pain Management: Electronic Signature(s) Signed: 06/05/2023 4:19:45 PM By: [name] Deed RN, BSN Entered By: [name] on 06/05/2023 09:52:36 -------------------------------------------------------------------------------- Patient/Caregiver Education Details Patient Name: Date of Service: [name] 9/25/2024andnbsp12:30 PM Medical Record Number: [id number] Patient Account Number: [id number] Date of Birth/Gender: Treating RN: 1940/11/09 (82 y.o. [name] Primary Care Physician: [name] Other Clinician: Referring Physician: Treating Physician/Extender: [name] in Treatment: 7 Education Assessment Education Provided To: Patient Education Topics Provided Venous: Methods: Explain/Verbal Responses: Reinforcements needed, State content correctly Wound/Skin Impairment: Methods: Explain/Verbal Responses: Reinforcements needed, State content correctly Electronic Signature(s) Signed: 06/05/2023 4:19:45 PM By: [name] Deed RN, BSN Entered By: [name] on 06/05/2023 09:41:39 [name] ([id number]) [id number] Page 7 of 9 -------------------------------------------------------------------------------- Wound Assessment Details Patient Name: Date of Service: [name], [name] 06/05/2023 12:30 PM Medical Record Number: [id number] Patient Account Number: [id number] Date of Birth/Sex: Treating RN: 1940-10-08 (82 y.o. F)  [name] Primary Care Arfa Lamarca: [name] Other Clinician: Referring Jerika Wales: Treating Kellen Dutch/Extender: [name] in  Treatment: 7 Wound Status Wound Number: 10 Primary Lymphedema Etiology: Wound Location: Left, Medial Lower Leg Secondary Diabetic Wound/Ulcer of the Lower Extremity Wounding Event: Gradually Appeared Etiology: Date Acquired: 03/25/2023 Wound Healed - Epithelialized Weeks Of Treatment: 7 Status: Clustered Wound: No Comorbid Lymphedema, Congestive Heart Failure, Hypertension, Peripheral History: Venous Disease, Type II Diabetes, Osteoarthritis, Neuropathy, Confinement Anxiety Photos Wound Measurements Length: (cm) Width: (cm) Depth: (cm) Area: (cm) Volume: (cm) 0 % Reduction in Area: 100% 0 % Reduction in Volume: 100% 0 Epithelialization: Large (67-100%) 0 Tunneling: No 0 Undermining: No Wound Description Classification: Full Thickness Without Exposed Support Exudate Amount: None Present Structures Foul Odor After Cleansing: No Slough/Fibrino No Wound Bed Granulation Amount: None Present (0%) Exposed Structure Necrotic Amount: None Present (0%) Fascia Exposed: No Fat Layer (Subcutaneous Tissue) Exposed: No Tendon Exposed: No Muscle Exposed: No Joint Exposed: No Bone Exposed: No Periwound Skin Texture Texture Color No Abnormalities Noted: Yes No Abnormalities Noted: No Hemosiderin Staining: Yes Moisture No Abnormalities Noted: Yes Temperature / Pain Temperature: No Abnormality Electronic Signature(s) Signed: 06/05/2023 4:19:45 PM By: [name] Deed RN, BSN Entered By: [name] on 06/05/2023 09:55:47 [name] ([id number]) [id number] Page 8 of 9 -------------------------------------------------------------------------------- Wound Assessment Details Patient Name: Date of Service: [name], [name] 06/05/2023 12:30 PM Medical Record Number: [id number] Patient Account Number: [id number] Date of Birth/Sex: Treating RN: [birth year] (82 y.o. Billy Coast, Bonita Quin Primary Care Dorenda Pfannenstiel: [name] Other Clinician: Referring  Keyoni Lapinski: Treating Aaiden Depoy/Extender: [name] in Treatment: 7 Wound Status Wound Number: 14 Primary Lymphedema Etiology: Wound Location: Left, Lateral Lower Leg Wound Healed - Epithelialized Wounding Event: Gradually Appeared Status: Date Acquired: 05/22/2023 Comorbid Lymphedema, Congestive Heart Failure, Hypertension, Peripheral Weeks Of Treatment: 2 History: Venous Disease, Type II Diabetes, Osteoarthritis, Neuropathy, Clustered Wound: No Confinement Anxiety Photos Wound Measurements Length: (cm) Width: (cm) Depth: (cm) Area: (cm) Volume: (cm) 0 % Reduction in Area: 100% 0 % Reduction in Volume: 100% 0 Epithelialization: Large (67-100%) 0 Tunneling: No 0 Undermining: No Wound Description Classification: Full Thickness Without Exposed Support Exudate Amount: None Present Structures Foul Odor After Cleansing: No Slough/Fibrino No Wound Bed Granulation Amount: None Present (0%) Exposed Structure Necrotic Amount: None Present (0%) Fascia Exposed: No Fat Layer (Subcutaneous Tissue) Exposed: No Tendon Exposed: No Muscle Exposed: No Joint Exposed: No Bone Exposed: No Periwound Skin Texture Texture Color No Abnormalities Noted: Yes No Abnormalities Noted: No Hemosiderin Staining: Yes Moisture No Abnormalities Noted: Yes Temperature / Pain Temperature: No Abnormality Electronic Signature(s) Signed: 06/05/2023 4:19:45 PM By: [name] Deed RN, BSN Entered By: [name] on 06/05/2023 09:56:22 [name] ([id number]) [id number] Page 9 of 9 -------------------------------------------------------------------------------- Vitals Details Patient Name: Date of Service: [name], [name] 06/05/2023 12:30 PM Medical Record Number: [id number] Patient Account Number: [id number] Date of Birth/Sex: Treating RN: 12/10/1940 (82 y.o. [name] Primary Care Luane Rochon: [name] Other Clinician: Referring  Yatziri Wainwright: Treating Norvella Loscalzo/Extender: [name] in Treatment: 7 Vital Signs Time Taken: 12:51 Temperature (F): 97.9 Height (in): 63 Pulse (bpm): 77 Weight (lbs): 209 Respiratory Rate (breaths/min): 20 Body Mass Index (BMI): 37 Blood Pressure (mmHg): 127/66 Capillary Blood Glucose (mg/dl): 270 Reference Range: 80 - 120 mg / dl Notes glucose per pt report this am Electronic Signature(s) Signed: 06/05/2023 4:19:45 PM By: [name] Deed RN, BSN Entered By: [name] on 06/05/2023 09:52:28  KATHALINE, COCHENOUR ([id number]) [id number] Page 1 of 9 Visit Report for 06/05/2023 Arrival Information Details Patient Name: Date of Service: [name], [name] 06/05/2023 12:30 PM Medical Record Number: [id number] Patient Account Number: [id number] Date of Birth/Sex: Treating RN: [birth year] (82 y.o. Billy Coast, Bonita Quin Primary Care Marsden Zaino: [name] Other Clinician: Referring Kristalynn Coddington: Treating Larua Collier/Extender: [name] in Treatment: 7 Visit Information History Since Last Visit Added or deleted any medications: No Patient Arrived: [name] Any new allergies or adverse reactions: No Arrival Time: 12:50 Had a fall or experienced change in No Accompanied By: self activities of daily living that may affect Transfer Assistance: None risk of falls: Patient Identification Verified: Yes Signs or symptoms of abuse/neglect since last visito No Secondary Verification Process Completed: Yes Hospitalized since last visit: No Patient Requires Transmission-Based Precautions: No Implantable device outside of the clinic excluding No Patient Has Alerts: No cellular tissue based products placed in the center since last visit: Has Compression in Place as Prescribed: No Pain Present Now: No Notes pt removed wraps this am to shower Electronic Signature(s) Signed: 06/05/2023 4:19:45 PM By: [name] Deed RN, BSN Entered By: [name] on 06/05/2023 09:51:34 -------------------------------------------------------------------------------- Clinic Level of Care Assessment Details Patient Name: Date of Service: [name], [name] 06/05/2023 12:30 PM Medical Record Number: [id number] Patient Account Number: [id number] Date of Birth/Sex: Treating RN: 1941-08-30 (82 y.o. F) Primary Care Leontine Radman: [name] Other Clinician: Referring Reeanna Acri: Treating Kiya Eno/Extender: [name] in Treatment: 7 Clinic Level of Care  Assessment Items TOOL 4 Quantity Score []  - 0 Use when only an EandM is performed on FOLLOW-UP visit ASSESSMENTS - Nursing Assessment / Reassessment X- 1 10 Reassessment of Co-morbidities (includes updates in patient status) X- 1 5 Reassessment of Adherence to Treatment Plan ASSESSMENTS - Wound and Skin A ssessment / Reassessment X - Simple Wound Assessment / Reassessment - one wound 1 5 []  - 0 Complex Wound Assessment / Reassessment - multiple wounds []  - 0 Dermatologic / Skin Assessment (not related to wound area) ASSESSMENTS - Focused Assessment []  - 0 Circumferential Edema Measurements - multi extremities Cdebaca, Marrisa ([id number]) [id number] Page 2 of 9 []  - 0 Nutritional Assessment / Counseling / Intervention []  - 0 Lower Extremity Assessment (monofilament, tuning fork, pulses) []  - 0 Peripheral Arterial Disease Assessment (using hand held doppler) ASSESSMENTS - Ostomy and/or Continence Assessment and Care []  - 0 Incontinence Assessment and Management []  - 0 Ostomy Care Assessment and Management (repouching, etc.) PROCESS - Coordination of Care []  - 0 Simple Patient / Family Education for ongoing care []  - 0 Complex (extensive) Patient / Family Education for ongoing care X- 1 10 Staff obtains Chiropractor, Records, T Results / Process Orders est []  - 0 Staff telephones HHA, Nursing Homes / Clarify orders / etc []  - 0 Routine Transfer to another Facility (non-emergent condition) []  - 0 Routine Hospital Admission (non-emergent condition) []  - 0 New Admissions / Manufacturing engineer / Ordering NPWT Apligraf, etc. , []  - 0 Emergency Hospital Admission (emergent condition) []  - 0 Simple Discharge Coordination X- 1 15 Complex (extensive) Discharge Coordination PROCESS - Special Needs []  - 0 Pediatric / Minor Patient Management []  - 0 Isolation Patient Management []  - 0 Hearing / Language / Visual special needs []  - 0 Assessment  of Community assistance (transportation, D/C planning, etc.) []  - 0 Additional assistance / Altered mentation []  - 0 Support Surface(s) Assessment (bed, cushion, seat, etc.) INTERVENTIONS - Wound Cleansing / Measurement []  - 0 Simple

## 2023-06-05 NOTE — Progress Notes (Addendum)
her wounds are closed. She has her compression garments with her today. Electronic Signature(s) Signed: 06/05/2023 1:02:45 PM By: Duanne Guess MD FACS Entered By: Duanne Guess on 06/05/2023 13:02:45 [name] ([id number]) [id number] Page 3 of 10 -------------------------------------------------------------------------------- Physical Exam Details Patient Name: Date of Service: [name], [name] 06/05/2023 12:30 PM Medical Record Number: [id number] Patient Account Number: [id number] Date of Birth/Sex: Treating RN: 02-Feb-1941 (82 y.o. F) Primary Care Provider: Aliene Beams Other Clinician: Referring Provider: Treating Provider/Extender: Karna Dupes in Treatment:  7 Constitutional . . . . no acute distress. Respiratory Normal work of breathing on room air. Notes 06/05/2023: All of her wounds are closed. Electronic Signature(s) Signed: 06/05/2023 1:04:19 PM By: Duanne Guess MD FACS Entered By: Duanne Guess on 06/05/2023 13:04:19 -------------------------------------------------------------------------------- Physician Orders Details Patient Name: Date of Service: [name], [name] 06/05/2023 12:30 PM Medical Record Number: [id number] Patient Account Number: [id number] Date of Birth/Sex: Treating RN: 1941-04-13 (82 y.o. Billy Coast, Bonita Quin Primary Care Provider: Aliene Beams Other Clinician: Referring Provider: Treating Provider/Extender: Karna Dupes in Treatment: 7 The following information was scribed by: Zenaida Deed The information was scribed for: Duanne Guess Verbal / Phone Orders: No Diagnosis Coding ICD-10 Coding Code Description L97.811 Non-pressure chronic ulcer of other part of right lower leg limited to breakdown of skin L97.821 Non-pressure chronic ulcer of other part of left lower leg limited to breakdown of skin L84 Corns and callosities I89.0 Lymphedema, not elsewhere classified I87.2 Venous insufficiency (chronic) (peripheral) E11.622 Type 2 diabetes mellitus with other skin ulcer I50.9 Heart failure, unspecified N18.4 Chronic kidney disease, stage 4 (severe) Discharge From [facility] Discharge from Wound Care Center Bathing/ Shower/ Hygiene May shower and wash wound with soap and water. Edema Control - Lymphedema / SCD / Other Elevate legs to the level of the heart or above for 30 minutes daily and/or when sitting for 3-4 times a day throughout the day. Avoid standing for long periods of time. Exercise regularly Moisturize legs daily. JNAE, THOMASTON ([id number]) [id number] Page 4 of 10 Compression stocking or Garment 20-30 mm/Hg pressure to: - Farrow  wraps daily Electronic Signature(s) Signed: 06/05/2023 1:04:31 PM By: Duanne Guess MD FACS Entered By: Duanne Guess on 06/05/2023 13:04:30 -------------------------------------------------------------------------------- Problem List Details Patient Name: Date of Service: GERILYN, STARGELL 06/05/2023 12:30 PM Medical Record Number: [id number] Patient Account Number: [id number] Date of Birth/Sex: Treating RN: 10/13/1940 (82 y.o. Billy Coast, Bonita Quin Primary Care Provider: Aliene Beams Other Clinician: Referring Provider: Treating Provider/Extender: Karna Dupes in Treatment: 7 Active Problems ICD-10 Encounter Code Description Active Date MDM Diagnosis L97.811 Non-pressure chronic ulcer of other part of right lower leg limited to breakdown 04/15/2023 No Yes of skin L97.821 Non-pressure chronic ulcer of other part of left lower leg limited to breakdown 04/15/2023 No Yes of skin I89.0 Lymphedema, not elsewhere classified 04/15/2023 No Yes I87.2 Venous insufficiency (chronic) (peripheral) 04/15/2023 No Yes L84 Corns and callosities 05/16/2023 No Yes E11.622 Type 2 diabetes mellitus with other skin ulcer 04/15/2023 No Yes I50.9 Heart failure, unspecified 04/15/2023 No Yes N18.4 Chronic kidney disease, stage 4 (severe) 04/15/2023 No Yes Inactive Problems Resolved Problems Electronic Signature(s) Signed: 06/05/2023 1:01:51 PM By: Duanne Guess MD FACS Entered By: Duanne Guess on 06/05/2023 13:01:51 [name] ([id number]) [id number] Page 5 of 10 -------------------------------------------------------------------------------- Progress Note Details Patient Name: Date of Service: [name], [name] 06/05/2023 12:30 PM Medical Record Number: [id number] Patient Account Number: [id number] Date of Birth/Sex: Treating RN: 07/12/1941 (82 y.o. F) Primary Care Provider:  her wounds are closed. She has her compression garments with her today. Electronic Signature(s) Signed: 06/05/2023 1:02:45 PM By: Duanne Guess MD FACS Entered By: Duanne Guess on 06/05/2023 13:02:45 [name] ([id number]) [id number] Page 3 of 10 -------------------------------------------------------------------------------- Physical Exam Details Patient Name: Date of Service: [name], [name] 06/05/2023 12:30 PM Medical Record Number: [id number] Patient Account Number: [id number] Date of Birth/Sex: Treating RN: 02-Feb-1941 (82 y.o. F) Primary Care Provider: Aliene Beams Other Clinician: Referring Provider: Treating Provider/Extender: Karna Dupes in Treatment:  7 Constitutional . . . . no acute distress. Respiratory Normal work of breathing on room air. Notes 06/05/2023: All of her wounds are closed. Electronic Signature(s) Signed: 06/05/2023 1:04:19 PM By: Duanne Guess MD FACS Entered By: Duanne Guess on 06/05/2023 13:04:19 -------------------------------------------------------------------------------- Physician Orders Details Patient Name: Date of Service: [name], [name] 06/05/2023 12:30 PM Medical Record Number: [id number] Patient Account Number: [id number] Date of Birth/Sex: Treating RN: 1941-04-13 (82 y.o. Billy Coast, Bonita Quin Primary Care Provider: Aliene Beams Other Clinician: Referring Provider: Treating Provider/Extender: Karna Dupes in Treatment: 7 The following information was scribed by: Zenaida Deed The information was scribed for: Duanne Guess Verbal / Phone Orders: No Diagnosis Coding ICD-10 Coding Code Description L97.811 Non-pressure chronic ulcer of other part of right lower leg limited to breakdown of skin L97.821 Non-pressure chronic ulcer of other part of left lower leg limited to breakdown of skin L84 Corns and callosities I89.0 Lymphedema, not elsewhere classified I87.2 Venous insufficiency (chronic) (peripheral) E11.622 Type 2 diabetes mellitus with other skin ulcer I50.9 Heart failure, unspecified N18.4 Chronic kidney disease, stage 4 (severe) Discharge From [facility] Discharge from Wound Care Center Bathing/ Shower/ Hygiene May shower and wash wound with soap and water. Edema Control - Lymphedema / SCD / Other Elevate legs to the level of the heart or above for 30 minutes daily and/or when sitting for 3-4 times a day throughout the day. Avoid standing for long periods of time. Exercise regularly Moisturize legs daily. JNAE, THOMASTON ([id number]) [id number] Page 4 of 10 Compression stocking or Garment 20-30 mm/Hg pressure to: - Farrow  wraps daily Electronic Signature(s) Signed: 06/05/2023 1:04:31 PM By: Duanne Guess MD FACS Entered By: Duanne Guess on 06/05/2023 13:04:30 -------------------------------------------------------------------------------- Problem List Details Patient Name: Date of Service: GERILYN, STARGELL 06/05/2023 12:30 PM Medical Record Number: [id number] Patient Account Number: [id number] Date of Birth/Sex: Treating RN: 10/13/1940 (82 y.o. Billy Coast, Bonita Quin Primary Care Provider: Aliene Beams Other Clinician: Referring Provider: Treating Provider/Extender: Karna Dupes in Treatment: 7 Active Problems ICD-10 Encounter Code Description Active Date MDM Diagnosis L97.811 Non-pressure chronic ulcer of other part of right lower leg limited to breakdown 04/15/2023 No Yes of skin L97.821 Non-pressure chronic ulcer of other part of left lower leg limited to breakdown 04/15/2023 No Yes of skin I89.0 Lymphedema, not elsewhere classified 04/15/2023 No Yes I87.2 Venous insufficiency (chronic) (peripheral) 04/15/2023 No Yes L84 Corns and callosities 05/16/2023 No Yes E11.622 Type 2 diabetes mellitus with other skin ulcer 04/15/2023 No Yes I50.9 Heart failure, unspecified 04/15/2023 No Yes N18.4 Chronic kidney disease, stage 4 (severe) 04/15/2023 No Yes Inactive Problems Resolved Problems Electronic Signature(s) Signed: 06/05/2023 1:01:51 PM By: Duanne Guess MD FACS Entered By: Duanne Guess on 06/05/2023 13:01:51 [name] ([id number]) [id number] Page 5 of 10 -------------------------------------------------------------------------------- Progress Note Details Patient Name: Date of Service: [name], [name] 06/05/2023 12:30 PM Medical Record Number: [id number] Patient Account Number: [id number] Date of Birth/Sex: Treating RN: 07/12/1941 (82 y.o. F) Primary Care Provider:  SAVAHANNA, ALMENDARIZ ([id number]) [id number] Page 1 of 10 Visit Report for 06/05/2023 Chief Complaint Document Details Patient Name: Date of Service: CHIKA, CICHOWSKI 06/05/2023 12:30 PM Medical Record Number: [id number] Patient Account Number: [id number] Date of Birth/Sex: Treating RN: 07/27/1941 (82 y.o. F) Primary Care Provider: Aliene Beams Other Clinician: Referring Provider: Treating Provider/Extender: Karna Dupes in Treatment: 7 Information Obtained from: Patient Chief Complaint 03/28/2020; patient is here for review of wounds on her bilateral lower legs 12/25/2022; bilateral lower extremity wound 04/15/2023: BLE wounds Electronic Signature(s) Signed: 06/05/2023 1:02:06 PM By: Duanne Guess MD FACS Entered By: Duanne Guess on 06/05/2023 13:02:06 -------------------------------------------------------------------------------- HPI Details Patient Name: Date of Service: LEATTA, ALEWINE 06/05/2023 12:30 PM Medical Record Number: [id number] Patient Account Number: [id number] Date of Birth/Sex: Treating RN: Apr 04, 1941 (82 y.o. F) Primary Care Provider: Aliene Beams Other Clinician: Referring Provider: Treating Provider/Extender: Karna Dupes in Treatment: 7 History of Present Illness HPI Description: ADMISSION 03/28/2020 This is a 82 year old woman who is accompanied by her daughter. She is moving to [place] from [place] where she lives. Infectious been going to the wound care center in [place] for the last 2 months. She apparently did not tolerate compression early on in that clinic stay. She is just using dry gauze over the wounds when she came in today. She tells me she has had both arterial and venous reflux studies although she does not have copies of these. She was apparently offered an ablation but I have no further information on that. She also is a type II diabetic. She was noted to  have wounds on her lower extremities during a CHF clinic visit with Dr. Gala Romney and she was referred here. She has several scattered areas on the right medial lower leg and a large area of superficial ulceration on the posterior medial left lower leg. past medical history; chronic kidney disease stage III, gastroesophageal reflux disease, type 2 diabetes with neuropathy, chronic diastolic heart failure, obstructive sleep apnea, hypertension, restless leg syndrome, pulmonary venous hypertension, history of colon CA, pacemaker and apparently venous reflux. We did not do arterial studies in the clinic today we are going to try to get the results that were already done within the last 2 months in [place] 7/26; patient readmitted to the clinic last week with predominantly chronic venous wounds almost circumferentially in the left lower leg and the right leg medially. She also has secondary lymphedema. We put her in compression. She went to the ER in [place] on Friday they remove the wrap on the left leg diagnosed her with cellulitis and put her on doxycycline. They apparently also did a ultrasound that was negative for DVT . We did not get any vascular information from [facility] where she apparently had arterial studies and venous studies. She is currently moving from [place] to [place] is up on her feet quite a bit. She was referred to vein and vascular here but never got to the appointment. ABIs were obtained here at 0.89 on the right and 1.02 on the left 8/9-Patient returns after establishing in clinic last visit, we have been doing 3 layer compression on both sides with calcium alginate, she is following up with the vein and vascular when she gets an appointment. The right side is healed the left side is still got some small open areas She has appt on Wed at Vein and vascular at which point she will come in for Nurse visit here BREANNAH, KRATT ([id number])  [id number] Page  Anani Gu on 06/05/2023 13:03:56 -------------------------------------------------------------------------------- SuperBill Details Patient Name: Date of Service: ALYNAH, SCHONE 06/05/2023 Medical Record Number: [id number] Patient Account Number: [id number] Date of Birth/Sex: Treating RN: Aug 06, 1941 (82 y.o. F) Primary Care Provider: Aliene Beams Other Clinician: Referring Provider: Treating Provider/Extender: Karna Dupes in Treatment: 7 Diagnosis Coding ICD-10 Codes Code Description [phone] Non-pressure chronic ulcer of other part of right lower leg limited to breakdown of skin L97.821 Non-pressure chronic ulcer of other part of left lower leg limited to breakdown of skin I89.0 Lymphedema, not elsewhere classified I87.2 Venous insufficiency (chronic) (peripheral) L84 Corns and callosities E11.622 Type 2 diabetes mellitus with other skin ulcer I50.9 Heart failure, unspecified N18.4 Chronic kidney disease, stage 4 (severe) Facility Procedures : CPT4 Code: [id number] Description: 62130 - WOUND CARE VISIT-LEV 2 EST PT Modifier: Quantity: 1 Physician Procedures : CPT4 Code Description Modifier [id number] - WC PHYS LEVEL 3 - EST PT ICD-10 Diagnosis Description L97.811 Non-pressure chronic ulcer of other part of right lower leg limited to breakdown of skin L97.821 Non-pressure chronic ulcer of other part of  left lower leg limited to breakdown of skin I89.0 Lymphedema, not elsewhere classified I87.2 Venous insufficiency (chronic) (peripheral) Quantity: 1 Electronic Signature(s) Signed: 06/11/2023 12:34:23 PM By: Pearletha Alfred Signed: 06/11/2023 12:40:00 PM By: Duanne Guess MD FACS Previous Signature:  06/05/2023 1:05:57 PM Version By: Duanne Guess MD FACS Entered By: Pearletha Alfred on 06/11/2023 12:34:23  legs to the level of the heart or above for 30 minutes daily and/or when sitting for 3-4 times a day throughout the day. Avoid standing for long periods of time. HAVILAH, TOPOR ([id number]) [id number] Page 8 of 10 Exercise regularly Moisturize legs daily. Compression stocking or Garment 20-30 mm/Hg pressure to: - Farrow wraps daily 06/05/2023: All of her wounds are closed. She has her compression garments with her today. She was assisted and instructed in proper application of her bilateral compression wraps. The importance of  wearing these on a daily basis was strongly emphasized. She should also keep her legs elevated is much as possible throughout the day and at night while she is sleeping. Strict adherence to her diuretic regimen was also recommended. Will discharge her from the wound care center. She may follow-up in the future as needed. Electronic Signature(s) Signed: 06/05/2023 1:05:37 PM By: Duanne Guess MD FACS Entered By: Duanne Guess on 06/05/2023 13:05:37 -------------------------------------------------------------------------------- HxROS Details Patient Name: Date of Service: CHERRI, YERA 06/05/2023 12:30 PM Medical Record Number: [id number] Patient Account Number: [id number] Date of Birth/Sex: Treating RN: 1940-09-28 (82 y.o. F) Primary Care Provider: Aliene Beams Other Clinician: Referring Provider: Treating Provider/Extender: Karna Dupes in Treatment: 7 Information Obtained From Patient Constitutional Symptoms (General Health) Medical History: Past Medical History Notes: obesity Eyes Medical History: Negative for: Cataracts; Glaucoma; Optic Neuritis Ear/Nose/Mouth/Throat Medical History: Negative for: Chronic sinus problems/congestion; Middle ear problems Hematologic/Lymphatic Medical History: Positive for: Lymphedema Negative for: Anemia; Hemophilia; Human Immunodeficiency Virus; Sickle Cell Disease Respiratory Medical History: Negative for: Aspiration; Asthma; Chronic Obstructive Pulmonary Disease (COPD); Pneumothorax; Sleep Apnea; Tuberculosis Cardiovascular Medical History: Positive for: Congestive Heart Failure; Hypertension; Peripheral Venous Disease Negative for: Angina; Arrhythmia; Coronary Artery Disease; Deep Vein Thrombosis; Hypotension; Myocardial Infarction; Peripheral Arterial Disease; Phlebitis; Vasculitis Past Medical History Notes: dyslipidemia, pacemaker Gastrointestinal Medical History: Negative for: Cirrhosis ; Colitis;  Crohns; Hepatitis A; Hepatitis B; Hepatitis C Past Medical History Notes: GERD [place], Bosie Clos ([id number]) [id number] Page 9 of 10 Endocrine Medical History: Positive for: Type II Diabetes Negative for: Type I Diabetes Past Medical History Notes: thyroid nodule Time with diabetes: 15 Treated with: Insulin Blood sugar tested every day: Yes Tested : 2-3 times per day Genitourinary Medical History: Negative for: End Stage Renal Disease Past Medical History Notes: CKD stage 3 Immunological Medical History: Negative for: Lupus Erythematosus; Raynauds; Scleroderma Integumentary (Skin) Medical History: Negative for: History of Burn Musculoskeletal Medical History: Positive for: Osteoarthritis Past Medical History Notes: fibromyalgia, lumbar spinal stenosis Neurologic Medical History: Positive for: Neuropathy Negative for: Dementia; Quadriplegia; Paraplegia; Seizure Disorder Oncologic Medical History: Negative for: Received Chemotherapy; Received Radiation Past Medical History Notes: hx colon CA Psychiatric Medical History: Positive for: Confinement Anxiety Negative for: Anorexia/bulimia Immunizations Pneumococcal Vaccine: Received Pneumococcal Vaccination: No Implantable Devices Yes Hospitalization / Surgery History Type of Hospitalization/Surgery tibia nail insertion right cardiac cath lumbar laminectomy pacemaker implanted appendectomy lumpectomy left breast DandC uterus partial nephrectomy tosillectomy tubal ligation Family and Social History Cancer: No; Diabetes: Yes - Siblings; Heart Disease: Yes - Mother; Hereditary Spherocytosis: No; Hypertension: Yes - Mother; Kidney Disease: Yes - Siblings; Lung Disease: No; Seizures: No; Stroke: No; Thyroid Problems: No; Tuberculosis: No; Never smoker; Marital Status - Widowed; Alcohol Use: Rarely; Drug Use: No History; Caffeine Use: Daily; Financial Concerns: No; Food, Clothing or Shelter  Needs: No; Support System Lacking: No; Transportation [place], Bosie Clos ([id number]) [id number] Page 10 of 10 Concerns: No Electronic Signature(s) Signed: 06/05/2023 4:57:33 PM By: Duanne Guess MD FACS Entered By: Lady Gary,  legs to the level of the heart or above for 30 minutes daily and/or when sitting for 3-4 times a day throughout the day. Avoid standing for long periods of time. HAVILAH, TOPOR ([id number]) [id number] Page 8 of 10 Exercise regularly Moisturize legs daily. Compression stocking or Garment 20-30 mm/Hg pressure to: - Farrow wraps daily 06/05/2023: All of her wounds are closed. She has her compression garments with her today. She was assisted and instructed in proper application of her bilateral compression wraps. The importance of  wearing these on a daily basis was strongly emphasized. She should also keep her legs elevated is much as possible throughout the day and at night while she is sleeping. Strict adherence to her diuretic regimen was also recommended. Will discharge her from the wound care center. She may follow-up in the future as needed. Electronic Signature(s) Signed: 06/05/2023 1:05:37 PM By: Duanne Guess MD FACS Entered By: Duanne Guess on 06/05/2023 13:05:37 -------------------------------------------------------------------------------- HxROS Details Patient Name: Date of Service: CHERRI, YERA 06/05/2023 12:30 PM Medical Record Number: [id number] Patient Account Number: [id number] Date of Birth/Sex: Treating RN: 1940-09-28 (82 y.o. F) Primary Care Provider: Aliene Beams Other Clinician: Referring Provider: Treating Provider/Extender: Karna Dupes in Treatment: 7 Information Obtained From Patient Constitutional Symptoms (General Health) Medical History: Past Medical History Notes: obesity Eyes Medical History: Negative for: Cataracts; Glaucoma; Optic Neuritis Ear/Nose/Mouth/Throat Medical History: Negative for: Chronic sinus problems/congestion; Middle ear problems Hematologic/Lymphatic Medical History: Positive for: Lymphedema Negative for: Anemia; Hemophilia; Human Immunodeficiency Virus; Sickle Cell Disease Respiratory Medical History: Negative for: Aspiration; Asthma; Chronic Obstructive Pulmonary Disease (COPD); Pneumothorax; Sleep Apnea; Tuberculosis Cardiovascular Medical History: Positive for: Congestive Heart Failure; Hypertension; Peripheral Venous Disease Negative for: Angina; Arrhythmia; Coronary Artery Disease; Deep Vein Thrombosis; Hypotension; Myocardial Infarction; Peripheral Arterial Disease; Phlebitis; Vasculitis Past Medical History Notes: dyslipidemia, pacemaker Gastrointestinal Medical History: Negative for: Cirrhosis ; Colitis;  Crohns; Hepatitis A; Hepatitis B; Hepatitis C Past Medical History Notes: GERD [place], Bosie Clos ([id number]) [id number] Page 9 of 10 Endocrine Medical History: Positive for: Type II Diabetes Negative for: Type I Diabetes Past Medical History Notes: thyroid nodule Time with diabetes: 15 Treated with: Insulin Blood sugar tested every day: Yes Tested : 2-3 times per day Genitourinary Medical History: Negative for: End Stage Renal Disease Past Medical History Notes: CKD stage 3 Immunological Medical History: Negative for: Lupus Erythematosus; Raynauds; Scleroderma Integumentary (Skin) Medical History: Negative for: History of Burn Musculoskeletal Medical History: Positive for: Osteoarthritis Past Medical History Notes: fibromyalgia, lumbar spinal stenosis Neurologic Medical History: Positive for: Neuropathy Negative for: Dementia; Quadriplegia; Paraplegia; Seizure Disorder Oncologic Medical History: Negative for: Received Chemotherapy; Received Radiation Past Medical History Notes: hx colon CA Psychiatric Medical History: Positive for: Confinement Anxiety Negative for: Anorexia/bulimia Immunizations Pneumococcal Vaccine: Received Pneumococcal Vaccination: No Implantable Devices Yes Hospitalization / Surgery History Type of Hospitalization/Surgery tibia nail insertion right cardiac cath lumbar laminectomy pacemaker implanted appendectomy lumpectomy left breast DandC uterus partial nephrectomy tosillectomy tubal ligation Family and Social History Cancer: No; Diabetes: Yes - Siblings; Heart Disease: Yes - Mother; Hereditary Spherocytosis: No; Hypertension: Yes - Mother; Kidney Disease: Yes - Siblings; Lung Disease: No; Seizures: No; Stroke: No; Thyroid Problems: No; Tuberculosis: No; Never smoker; Marital Status - Widowed; Alcohol Use: Rarely; Drug Use: No History; Caffeine Use: Daily; Financial Concerns: No; Food, Clothing or Shelter  Needs: No; Support System Lacking: No; Transportation [place], Bosie Clos ([id number]) [id number] Page 10 of 10 Concerns: No Electronic Signature(s) Signed: 06/05/2023 4:57:33 PM By: Duanne Guess MD FACS Entered By: Lady Gary,  SAVAHANNA, ALMENDARIZ ([id number]) [id number] Page 1 of 10 Visit Report for 06/05/2023 Chief Complaint Document Details Patient Name: Date of Service: CHIKA, CICHOWSKI 06/05/2023 12:30 PM Medical Record Number: [id number] Patient Account Number: [id number] Date of Birth/Sex: Treating RN: 07/27/1941 (82 y.o. F) Primary Care Provider: Aliene Beams Other Clinician: Referring Provider: Treating Provider/Extender: Karna Dupes in Treatment: 7 Information Obtained from: Patient Chief Complaint 03/28/2020; patient is here for review of wounds on her bilateral lower legs 12/25/2022; bilateral lower extremity wound 04/15/2023: BLE wounds Electronic Signature(s) Signed: 06/05/2023 1:02:06 PM By: Duanne Guess MD FACS Entered By: Duanne Guess on 06/05/2023 13:02:06 -------------------------------------------------------------------------------- HPI Details Patient Name: Date of Service: LEATTA, ALEWINE 06/05/2023 12:30 PM Medical Record Number: [id number] Patient Account Number: [id number] Date of Birth/Sex: Treating RN: Apr 04, 1941 (82 y.o. F) Primary Care Provider: Aliene Beams Other Clinician: Referring Provider: Treating Provider/Extender: Karna Dupes in Treatment: 7 History of Present Illness HPI Description: ADMISSION 03/28/2020 This is a 82 year old woman who is accompanied by her daughter. She is moving to [place] from [place] where she lives. Infectious been going to the wound care center in [place] for the last 2 months. She apparently did not tolerate compression early on in that clinic stay. She is just using dry gauze over the wounds when she came in today. She tells me she has had both arterial and venous reflux studies although she does not have copies of these. She was apparently offered an ablation but I have no further information on that. She also is a type II diabetic. She was noted to  have wounds on her lower extremities during a CHF clinic visit with Dr. Gala Romney and she was referred here. She has several scattered areas on the right medial lower leg and a large area of superficial ulceration on the posterior medial left lower leg. past medical history; chronic kidney disease stage III, gastroesophageal reflux disease, type 2 diabetes with neuropathy, chronic diastolic heart failure, obstructive sleep apnea, hypertension, restless leg syndrome, pulmonary venous hypertension, history of colon CA, pacemaker and apparently venous reflux. We did not do arterial studies in the clinic today we are going to try to get the results that were already done within the last 2 months in [place] 7/26; patient readmitted to the clinic last week with predominantly chronic venous wounds almost circumferentially in the left lower leg and the right leg medially. She also has secondary lymphedema. We put her in compression. She went to the ER in [place] on Friday they remove the wrap on the left leg diagnosed her with cellulitis and put her on doxycycline. They apparently also did a ultrasound that was negative for DVT . We did not get any vascular information from [facility] where she apparently had arterial studies and venous studies. She is currently moving from [place] to [place] is up on her feet quite a bit. She was referred to vein and vascular here but never got to the appointment. ABIs were obtained here at 0.89 on the right and 1.02 on the left 8/9-Patient returns after establishing in clinic last visit, we have been doing 3 layer compression on both sides with calcium alginate, she is following up with the vein and vascular when she gets an appointment. The right side is healed the left side is still got some small open areas She has appt on Wed at Vein and vascular at which point she will come in for Nurse visit here BREANNAH, KRATT ([id number])  [id number] Page  Anani Gu on 06/05/2023 13:03:56 -------------------------------------------------------------------------------- SuperBill Details Patient Name: Date of Service: ALYNAH, SCHONE 06/05/2023 Medical Record Number: [id number] Patient Account Number: [id number] Date of Birth/Sex: Treating RN: Aug 06, 1941 (82 y.o. F) Primary Care Provider: Aliene Beams Other Clinician: Referring Provider: Treating Provider/Extender: Karna Dupes in Treatment: 7 Diagnosis Coding ICD-10 Codes Code Description [phone] Non-pressure chronic ulcer of other part of right lower leg limited to breakdown of skin L97.821 Non-pressure chronic ulcer of other part of left lower leg limited to breakdown of skin I89.0 Lymphedema, not elsewhere classified I87.2 Venous insufficiency (chronic) (peripheral) L84 Corns and callosities E11.622 Type 2 diabetes mellitus with other skin ulcer I50.9 Heart failure, unspecified N18.4 Chronic kidney disease, stage 4 (severe) Facility Procedures : CPT4 Code: [id number] Description: 62130 - WOUND CARE VISIT-LEV 2 EST PT Modifier: Quantity: 1 Physician Procedures : CPT4 Code Description Modifier [id number] - WC PHYS LEVEL 3 - EST PT ICD-10 Diagnosis Description L97.811 Non-pressure chronic ulcer of other part of right lower leg limited to breakdown of skin L97.821 Non-pressure chronic ulcer of other part of  left lower leg limited to breakdown of skin I89.0 Lymphedema, not elsewhere classified I87.2 Venous insufficiency (chronic) (peripheral) Quantity: 1 Electronic Signature(s) Signed: 06/11/2023 12:34:23 PM By: Pearletha Alfred Signed: 06/11/2023 12:40:00 PM By: Duanne Guess MD FACS Previous Signature:  06/05/2023 1:05:57 PM Version By: Duanne Guess MD FACS Entered By: Pearletha Alfred on 06/11/2023 12:34:23  SAVAHANNA, ALMENDARIZ ([id number]) [id number] Page 1 of 10 Visit Report for 06/05/2023 Chief Complaint Document Details Patient Name: Date of Service: CHIKA, CICHOWSKI 06/05/2023 12:30 PM Medical Record Number: [id number] Patient Account Number: [id number] Date of Birth/Sex: Treating RN: 07/27/1941 (82 y.o. F) Primary Care Provider: Aliene Beams Other Clinician: Referring Provider: Treating Provider/Extender: Karna Dupes in Treatment: 7 Information Obtained from: Patient Chief Complaint 03/28/2020; patient is here for review of wounds on her bilateral lower legs 12/25/2022; bilateral lower extremity wound 04/15/2023: BLE wounds Electronic Signature(s) Signed: 06/05/2023 1:02:06 PM By: Duanne Guess MD FACS Entered By: Duanne Guess on 06/05/2023 13:02:06 -------------------------------------------------------------------------------- HPI Details Patient Name: Date of Service: LEATTA, ALEWINE 06/05/2023 12:30 PM Medical Record Number: [id number] Patient Account Number: [id number] Date of Birth/Sex: Treating RN: Apr 04, 1941 (82 y.o. F) Primary Care Provider: Aliene Beams Other Clinician: Referring Provider: Treating Provider/Extender: Karna Dupes in Treatment: 7 History of Present Illness HPI Description: ADMISSION 03/28/2020 This is a 82 year old woman who is accompanied by her daughter. She is moving to [place] from [place] where she lives. Infectious been going to the wound care center in [place] for the last 2 months. She apparently did not tolerate compression early on in that clinic stay. She is just using dry gauze over the wounds when she came in today. She tells me she has had both arterial and venous reflux studies although she does not have copies of these. She was apparently offered an ablation but I have no further information on that. She also is a type II diabetic. She was noted to  have wounds on her lower extremities during a CHF clinic visit with Dr. Gala Romney and she was referred here. She has several scattered areas on the right medial lower leg and a large area of superficial ulceration on the posterior medial left lower leg. past medical history; chronic kidney disease stage III, gastroesophageal reflux disease, type 2 diabetes with neuropathy, chronic diastolic heart failure, obstructive sleep apnea, hypertension, restless leg syndrome, pulmonary venous hypertension, history of colon CA, pacemaker and apparently venous reflux. We did not do arterial studies in the clinic today we are going to try to get the results that were already done within the last 2 months in [place] 7/26; patient readmitted to the clinic last week with predominantly chronic venous wounds almost circumferentially in the left lower leg and the right leg medially. She also has secondary lymphedema. We put her in compression. She went to the ER in [place] on Friday they remove the wrap on the left leg diagnosed her with cellulitis and put her on doxycycline. They apparently also did a ultrasound that was negative for DVT . We did not get any vascular information from [facility] where she apparently had arterial studies and venous studies. She is currently moving from [place] to [place] is up on her feet quite a bit. She was referred to vein and vascular here but never got to the appointment. ABIs were obtained here at 0.89 on the right and 1.02 on the left 8/9-Patient returns after establishing in clinic last visit, we have been doing 3 layer compression on both sides with calcium alginate, she is following up with the vein and vascular when she gets an appointment. The right side is healed the left side is still got some small open areas She has appt on Wed at Vein and vascular at which point she will come in for Nurse visit here BREANNAH, KRATT ([id number])  [id number] Page  Anani Gu on 06/05/2023 13:03:56 -------------------------------------------------------------------------------- SuperBill Details Patient Name: Date of Service: ALYNAH, SCHONE 06/05/2023 Medical Record Number: [id number] Patient Account Number: [id number] Date of Birth/Sex: Treating RN: Aug 06, 1941 (82 y.o. F) Primary Care Provider: Aliene Beams Other Clinician: Referring Provider: Treating Provider/Extender: Karna Dupes in Treatment: 7 Diagnosis Coding ICD-10 Codes Code Description [phone] Non-pressure chronic ulcer of other part of right lower leg limited to breakdown of skin L97.821 Non-pressure chronic ulcer of other part of left lower leg limited to breakdown of skin I89.0 Lymphedema, not elsewhere classified I87.2 Venous insufficiency (chronic) (peripheral) L84 Corns and callosities E11.622 Type 2 diabetes mellitus with other skin ulcer I50.9 Heart failure, unspecified N18.4 Chronic kidney disease, stage 4 (severe) Facility Procedures : CPT4 Code: [id number] Description: 62130 - WOUND CARE VISIT-LEV 2 EST PT Modifier: Quantity: 1 Physician Procedures : CPT4 Code Description Modifier [id number] 99213 - WC PHYS LEVEL 3 - EST PT ICD-10 Diagnosis Description L97.811 Non-pressure chronic ulcer of other part of right lower leg limited to breakdown of skin L97.821 Non-pressure chronic ulcer of other part of  left lower leg limited to breakdown of skin I89.0 Lymphedema, not elsewhere classified I87.2 Venous insufficiency (chronic) (peripheral) Quantity: 1 Electronic Signature(s) Signed: 06/11/2023 12:34:23 PM By: Pearletha Alfred Signed: 06/11/2023 12:40:00 PM By: Duanne Guess MD FACS Previous Signature:  06/05/2023 1:05:57 PM Version By: Duanne Guess MD FACS Entered By: Pearletha Alfred on 06/11/2023 12:34:23

## 2023-06-11 DIAGNOSIS — M25511 Pain in right shoulder: Secondary | ICD-10-CM | POA: Diagnosis not present

## 2023-06-11 DIAGNOSIS — M25522 Pain in left elbow: Secondary | ICD-10-CM | POA: Diagnosis not present

## 2023-06-12 DIAGNOSIS — Z23 Encounter for immunization: Secondary | ICD-10-CM | POA: Diagnosis not present

## 2023-06-12 DIAGNOSIS — G2581 Restless legs syndrome: Secondary | ICD-10-CM | POA: Diagnosis not present

## 2023-06-12 DIAGNOSIS — I509 Heart failure, unspecified: Secondary | ICD-10-CM | POA: Diagnosis not present

## 2023-06-12 DIAGNOSIS — E118 Type 2 diabetes mellitus with unspecified complications: Secondary | ICD-10-CM | POA: Diagnosis not present

## 2023-06-12 DIAGNOSIS — G894 Chronic pain syndrome: Secondary | ICD-10-CM | POA: Diagnosis not present

## 2023-06-12 DIAGNOSIS — R0609 Other forms of dyspnea: Secondary | ICD-10-CM | POA: Diagnosis not present

## 2023-06-12 DIAGNOSIS — Z993 Dependence on wheelchair: Secondary | ICD-10-CM | POA: Diagnosis not present

## 2023-06-17 DIAGNOSIS — K30 Functional dyspepsia: Secondary | ICD-10-CM | POA: Diagnosis not present

## 2023-06-17 DIAGNOSIS — E1122 Type 2 diabetes mellitus with diabetic chronic kidney disease: Secondary | ICD-10-CM | POA: Diagnosis not present

## 2023-06-17 DIAGNOSIS — I5030 Unspecified diastolic (congestive) heart failure: Secondary | ICD-10-CM | POA: Diagnosis not present

## 2023-06-17 DIAGNOSIS — N184 Chronic kidney disease, stage 4 (severe): Secondary | ICD-10-CM | POA: Diagnosis not present

## 2023-06-17 DIAGNOSIS — N2581 Secondary hyperparathyroidism of renal origin: Secondary | ICD-10-CM | POA: Diagnosis not present

## 2023-06-17 DIAGNOSIS — E876 Hypokalemia: Secondary | ICD-10-CM | POA: Diagnosis not present

## 2023-06-17 DIAGNOSIS — I129 Hypertensive chronic kidney disease with stage 1 through stage 4 chronic kidney disease, or unspecified chronic kidney disease: Secondary | ICD-10-CM | POA: Diagnosis not present

## 2023-07-10 ENCOUNTER — Ambulatory Visit
Admission: RE | Admit: 2023-07-10 | Discharge: 2023-07-10 | Disposition: A | Discharge status: Home / Self Care | Payer: Medicare Other | Source: Ambulatory Visit | Attending: Family Medicine | Admitting: Family Medicine

## 2023-07-10 ENCOUNTER — Other Ambulatory Visit: Payer: Self-pay | Admitting: Family Medicine

## 2023-07-10 DIAGNOSIS — I700 Atherosclerosis of aorta: Secondary | ICD-10-CM | POA: Diagnosis not present

## 2023-07-10 DIAGNOSIS — R0609 Other forms of dyspnea: Secondary | ICD-10-CM

## 2023-07-10 DIAGNOSIS — Z950 Presence of cardiac pacemaker: Secondary | ICD-10-CM | POA: Diagnosis not present

## 2023-08-01 ENCOUNTER — Encounter (HOSPITAL_COMMUNITY): Payer: Self-pay | Admitting: Internal Medicine

## 2023-08-01 ENCOUNTER — Ambulatory Visit (HOSPITAL_COMMUNITY)
Admission: RE | Admit: 2023-08-01 | Discharge: 2023-08-01 | Disposition: A | Discharge status: Home / Self Care | Payer: Medicare Other | Source: Ambulatory Visit | Attending: Internal Medicine | Admitting: Internal Medicine

## 2023-08-01 VITALS — BP 154/80 | HR 83 | Wt 213.4 lb

## 2023-08-01 DIAGNOSIS — Z794 Long term (current) use of insulin: Secondary | ICD-10-CM | POA: Diagnosis not present

## 2023-08-01 DIAGNOSIS — Z6837 Body mass index (BMI) 37.0-37.9, adult: Secondary | ICD-10-CM | POA: Insufficient documentation

## 2023-08-01 DIAGNOSIS — R0609 Other forms of dyspnea: Secondary | ICD-10-CM | POA: Diagnosis not present

## 2023-08-01 DIAGNOSIS — Z950 Presence of cardiac pacemaker: Secondary | ICD-10-CM | POA: Insufficient documentation

## 2023-08-01 DIAGNOSIS — N184 Chronic kidney disease, stage 4 (severe): Secondary | ICD-10-CM | POA: Diagnosis not present

## 2023-08-01 DIAGNOSIS — I5032 Chronic diastolic (congestive) heart failure: Secondary | ICD-10-CM | POA: Diagnosis not present

## 2023-08-01 DIAGNOSIS — E1122 Type 2 diabetes mellitus with diabetic chronic kidney disease: Secondary | ICD-10-CM | POA: Insufficient documentation

## 2023-08-01 DIAGNOSIS — I5033 Acute on chronic diastolic (congestive) heart failure: Secondary | ICD-10-CM | POA: Insufficient documentation

## 2023-08-01 DIAGNOSIS — E669 Obesity, unspecified: Secondary | ICD-10-CM | POA: Insufficient documentation

## 2023-08-01 DIAGNOSIS — G4733 Obstructive sleep apnea (adult) (pediatric): Secondary | ICD-10-CM | POA: Diagnosis not present

## 2023-08-01 DIAGNOSIS — I350 Nonrheumatic aortic (valve) stenosis: Secondary | ICD-10-CM | POA: Insufficient documentation

## 2023-08-01 DIAGNOSIS — I130 Hypertensive heart and chronic kidney disease with heart failure and stage 1 through stage 4 chronic kidney disease, or unspecified chronic kidney disease: Secondary | ICD-10-CM | POA: Insufficient documentation

## 2023-08-01 DIAGNOSIS — I495 Sick sinus syndrome: Secondary | ICD-10-CM | POA: Insufficient documentation

## 2023-08-01 DIAGNOSIS — I480 Paroxysmal atrial fibrillation: Secondary | ICD-10-CM

## 2023-08-01 DIAGNOSIS — N183 Chronic kidney disease, stage 3 unspecified: Secondary | ICD-10-CM | POA: Diagnosis present

## 2023-08-01 LAB — BASIC METABOLIC PANEL
Anion gap: 9 (ref 5–15)
BUN: 51 mg/dL — ABNORMAL HIGH (ref 8–23)
CO2: 30 mmol/L (ref 22–32)
Calcium: 9.1 mg/dL (ref 8.9–10.3)
Chloride: 99 mmol/L (ref 98–111)
Creatinine, Ser: 1.89 mg/dL — ABNORMAL HIGH (ref 0.44–1.00)
GFR, Estimated: 26 mL/min — ABNORMAL LOW (ref >60)
Glucose, Bld: 118 mg/dL — ABNORMAL HIGH (ref 70–99)
Potassium: 3.8 mmol/L (ref 3.5–5.1)
Sodium: 138 mmol/L (ref 135–145)

## 2023-08-01 LAB — BRAIN NATRIURETIC PEPTIDE: B Natriuretic Peptide: 96.8 pg/mL (ref 0.0–100.0)

## 2023-08-01 MED ORDER — FUROSCIX 80 MG/10ML ~~LOC~~ CTKT: 80.0000 mg | CARTRIDGE | SUBCUTANEOUS | Status: AC

## 2023-08-01 NOTE — Progress Notes (Signed)
ReDS Vest / Clip - 08/01/23 1135       ReDS Vest / Clip   Station Marker B    Ruler Value 34    ReDS Value Range Low volume    ReDS Actual Value 33

## 2023-08-01 NOTE — Progress Notes (Signed)
Provided patient education on Furoscix using demo kits and Furoscix video, QR code provided on AVS for further viewing. Furoscix order form completed online.

## 2023-08-01 NOTE — Patient Instructions (Signed)
Medication Changes:  Your provider has order Furoscix for you. This is an on-body infuser that gives you a dose of Furosemide.   It will be shipped to your home   [employer] [facility] will call you to discuss before shipping so, PLEASE answer unknown calls  For questions regarding the device call [employer] [facility] at [phone]  Ensure you write down the time you start your infusion so that if there is a problem you will know how long the infusion lasted  Use Furoscix only AS DIRECTED by our office  Dosing Directions:   Day 1= TODAY use Furoscix and take Metolazone 2.5 mg, an extra 40 meq (4 tabs) of Potassium, and Torsemide in the evening  Day 2= Furoscix in AM and Torsemide in PM  Day 3= Furoscix in AM and Torsemide in PM  Day 4= Furoscix in AM and Torsemide in PM  Day 5=Furoscix in AM and Torsemide in PM  Day 6= Tue 11/26 Resume Torsemide 40 mg in AM    Lab Work:  Labs done today, your results will be available in MyChart, we will contact you for abnormal readings.  Testing/Procedures:  none  Referrals:  none  Special Instructions // Education:  Do the following things EVERYDAY: Weigh yourself in the morning before breakfast. Write it down and keep it in a log. Take your medicines as prescribed Eat low salt foods--Limit salt (sodium) to 2000 mg per day.  Stay as active as you can everyday Limit all fluids for the day to less than 2 liters   Follow-Up in: next week   At [facility], you and your health needs are our priority. We have a designated team specialized in the treatment of Heart Failure. This Care Team includes your primary Heart Failure Specialized Cardiologist (physician), Advanced Practice Providers (APPs- Physician Assistants and Nurse Practitioners), and Pharmacist who all work together to provide you with the care you need, when you need it.   You may see any of the following providers on your designated Care Team at your  next follow up:  Dr. Arvilla Meres Dr. Marca Ancona Dr. Dorthula Nettles Dr. Theresia Bough Tonye Becket, NP Robbie Lis, [facility] [place], [place] Brynda Peon, NP [place] Lee, NP Karle Plumber, PharmD   Please be sure to bring in all your medications bottles to every appointment.   Need to Contact us:  If you have any questions or concerns before your next appointment please send [place] a message through [place] or call our office at [phone].    TO LEAVE A MESSAGE FOR THE NURSE SELECT OPTION 2, PLEASE LEAVE A MESSAGE INCLUDING: YOUR NAME DATE OF BIRTH CALL BACK NUMBER REASON FOR CALL**this is important as we prioritize the call backs  YOU WILL RECEIVE A CALL BACK THE SAME DAY AS LONG AS YOU CALL BEFORE 4:00 PM

## 2023-08-01 NOTE — Progress Notes (Signed)
Advanced [facility] Failure Clinic Note   Date:  08/01/2023   ID:  [name], DOB 1941-03-13, MRN [id number]  Location: Home  Provider location: [facility] Type of Visit: Established patient  PCP:  Aliene Beams, MD  Cardiologist:  Armanda Magic, MD Nephrology: Dr. Malen Gauze HF Cardiologist: Dr. Gala Romney  Chief Complaint: Heart Failure follow-up   HPI: [name] is a 82 y.o.Marland Kitchen female with history of diastolic heart failure, DM, GERD, colon cancer, CKD stage III, 2016 pacemaker (MDT) due to symptomatic sinus node dysfunction.  In 7/19, she had 12 pound weight and she was sent to the ED by her PCP. She was sent home on metolazone every other day for 14 days. She took 4 doses and her creatinine went up to 3 so metolazone was stopped.   R/L cath in 9/19. Normal cors. Elevated filling pressures with pulmonary venous HTN.   Had fall with R tibia fracture in 9/21    Having more dyspnea at follow up 2/22, arranged for RHC and repeat echo.  RHC (2/22) showed mild to moderate elevated filling pressures with prominent v waves in PCWP tracing, mild pulmonary HTN and high CO with no evidence of intracardiac shunting. Echo (4/22) showed EF 60-65%, RV ok, mild AS.  SCr remained elevated on lab check at EP visit 8/22 and irbesartan stopped.  Echo 01/17/22 EF 60-65% G1DD RV normal. RVSP 31  Follow up 9/23, chronically  NYHA IIIb, volume OK on torsemide 40 + metolazone once a week.  Echo 9/24 EF 60-65% RV ok. Diastolic function indeterminate   Today she returns for HF follow up with her daughter, Raynelle Fanning. [facility] About a month ago LE edema resolved. But now coming back. Seen by Dr. Malen Gauze (Nephrology) 2 months ago and told to use metolazone. Drinks water and iced tea frequently. Occasional CP which is fleeting. + LE edema  Takes torsemide 40 daily   Cardiac Studies: - Echo (5/23): EF 60-65%, grade I DD, RV normal, RVSP 31  -  Echo (4/22): EF 60-65%, RV ok, mild AS mean gradient 21.0 mmHg  - RHC (2/22):  RA = 9 RV = 49/13 PA = 49/17 (33) PCW = 22 ( v waves 28) Fick cardiac output/index = 8.0/3.9 PVR = 1.6 WU FA sat = 95% PA sat = 74%, 73% High SVC sat = 69%  Assessment: 1. Mild to moderately elevated filling pressures with prominent v waves in PCWP tracing suggestive of diastolic dysfunction versus mitral regurgitation 2. Mild pulmonary venous HTN 3. High cardiac output with no evidence of intracardiac shunting  - PFTs 05/23/18: FEV1: 1.72 FEV1/FVC ratio: 77% DLCO: 70%  - R/LHC 05/19/18: Ao = 192/74 (118) LV = 201/26 RA = 14 RV = 53/18 PA = 53/19 (38) PCW = 27 (v = 41) Fick cardiac output/index = 5.6/2.8 PVR = 2.0 WU Ao sat = 95% PA sat = 64%, 66%  1. Normal coronaries with ectactic vessels suggestive of longstanding HTN 2. Severe HTN 3. Normal LV function 4. Signficantly elevated filling pressures with pulmonary venous HTN in setting of holding diuretics for 2 days  Plan/Discussion: Filling pressures and BP elevated. Will resume diuretics. Will need aggressive titration of ant-HTN regimen.  - Echo 05/14/18: EF 50-55%, mild LVH - Aortic valve: Sclerosis without stenosis. - Mitral valve: Moderately calcified annulus. Moderately thickened,   mildly calcified leaflets . - Left atrium: The atrium was mildly dilated. - Atrial septum: No defect or  patent foramen ovale was identified.   -Sleep study (10/19): AHI 49  - Myoview (06/21/15): Nuclear stress EF: 64%. There was no ST segment deviation noted during stress. The study is normal. This is a low risk study. No ischemia identified.  SH: Former 30-40 pack year smoker, quit 25 years ago.   Past Medical History:  Diagnosis Date   Anxiety    Arthritis    Cancer (HCC)    colon   CHF (congestive heart failure) (HCC)    Chronic kidney disease    stage 3   Chronic pain syndrome    Diabetes (HCC)    Diabetic neuropathy (HCC)     Dyslipidemia    Early cataracts, bilateral    Fibromyalgia    GERD (gastroesophageal reflux disease)    H/O syncope    Heart murmur    Hypertension    Lumbar spinal stenosis    and scoliosis   Pneumonia    PONV (postoperative nausea and vomiting)    Presence of permanent cardiac pacemaker    Restless legs    Spinal headache    Thyroid nodule    Past Surgical History:  Procedure Laterality Date   APPENDECTOMY     BREAST SURGERY     lumpectomy   COLON SURGERY     DILATION AND CURETTAGE OF UTERUS     EP IMPLANTABLE DEVICE N/A 07/21/2015   Procedure: Pacemaker Implant;  Surgeon: Duke Salvia, MD;  Location: [facility] INVASIVE CV LAB;  Service: Cardiovascular;  Laterality: N/A;   LUMBAR LAMINECTOMY/DECOMPRESSION MICRODISCECTOMY N/A 02/02/2016   Procedure: DECOMPRESSION L4-L5 WITH INSITE 2 FUSION ;  Surgeon: Venita Lick, MD;  Location: [facility] OR;  Service: Orthopedics;  Laterality: N/A;   PARTIAL NEPHRECTOMY     RIGHT HEART CATH N/A 11/02/2020   Procedure: RIGHT HEART CATH;  Surgeon: Dolores Patty, MD;  Location: [facility] INVASIVE CV LAB;  Service: Cardiovascular;  Laterality: N/A;   RIGHT/LEFT HEART CATH AND CORONARY ANGIOGRAPHY N/A 05/19/2018   Procedure: RIGHT/LEFT HEART CATH AND CORONARY ANGIOGRAPHY;  Surgeon: Dolores Patty, MD;  Location: [facility] INVASIVE CV LAB;  Service: Cardiovascular;  Laterality: N/A;   TIBIA IM NAIL INSERTION Right 05/19/2020   Procedure: INTRAMEDULLARY (IM) NAIL TIBIAL;  Surgeon: Yolonda Kida, MD;  Location: [facility] OR;  Service: Orthopedics;  Laterality: Right;   TONSILLECTOMY     TUBAL LIGATION     Current Outpatient Medications  Medication Sig Dispense Refill   acetaminophen (TYLENOL) 650 MG CR tablet 2 tablets     B-D UF III MINI PEN NEEDLES 31G X 5 MM MISC      Blood Glucose Monitoring Suppl (ACCU-CHEK AVIVA PLUS) w/Device KIT      Calcium Carb-Cholecalciferol (CALCIUM 500 + D3) 500-15 MG-MCG TABS Take 2 tablets by mouth daily.     Cholecalciferol  (VITAMIN D) 2000 UNITS tablet Take 2,000 Units by mouth daily.     Continuous Blood Gluc Sensor (FREESTYLE LIBRE 2 SENSOR) MISC See admin instructions.     cyclobenzaprine (FLEXERIL) 10 MG tablet Take 10 mg by mouth 3 (three) times daily as needed for muscle spasms.     diclofenac Sodium (VOLTAREN) 1 % GEL Apply 1 application topically at bedtime as needed (sleep).     diphenhydrAMINE (BENADRYL) 25 MG tablet Take 25 mg by mouth daily as needed for itching or allergies.     DULoxetine (CYMBALTA) 60 MG capsule Take 60 mg by mouth daily.     gabapentin (NEURONTIN) 300 MG capsule Take 300  mg by mouth at bedtime.     hydrALAZINE (APRESOLINE) 100 MG tablet Take 1 tablet (100 mg total) by mouth 3 (three) times daily. 90 tablet 6   insulin degludec (TRESIBA FLEXTOUCH) 100 UNIT/ML FlexTouch Pen 50 Units as directed.     labetalol (NORMODYNE) 200 MG tablet Take 1 tablet (200 mg total) by mouth 2 (two) times daily. Absolute last refill without office visit please call [phone] to schedule follow up 180 tablet 2   linaCLOtide (LINZESS PO) Take 72 mcg by mouth daily.     metolazone (ZAROXOLYN) 2.5 MG tablet Take 1 tablet (2.5 mg total) by mouth once a week. Every WEDNESDAY 12 tablet 3   oxyCODONE-acetaminophen (PERCOCET) 10-325 MG tablet Take 1 tablet by mouth every 4 (four) hours as needed for pain.     potassium chloride SA (KLOR-CON M) 10 MEQ tablet Take 2 tablets (20 mEq total) by mouth daily. Take 4 extra tab on [facility] with metolazone 60 tablet 11   pramipexole (MIRAPEX) 0.125 MG tablet Take 0.25 mg by mouth 2 (two) times daily.     rosuvastatin (CRESTOR) 10 MG tablet Take 10 mg by mouth daily.      torsemide (DEMADEX) 20 MG tablet Take 2 tablets (40 mg total) by mouth daily. 60 tablet 11   No current facility-administered medications for this encounter.   Allergies:   Penicillins, Codeine, Adhesive [tape], Aspirin, Crab (diagnostic), Iodine, Ivp dye [iodinated contrast media], and Sulfa  antibiotics   Social History:  The patient  reports that she quit smoking about 39 years ago. Her smoking use included cigarettes. She started smoking about 69 years ago. She has never used smokeless tobacco. She reports current alcohol use. She reports that she does not use drugs.   Family History:  The patient's family history includes CAD in an other family member; Sick sinus syndrome in her brother.   ROS:  Please see the history of present illness.   All other systems are personally reviewed and negative.   Recent Labs: 03/27/2023: ALT 17; Hemoglobin 12.8; Platelets 148 05/17/2023: BUN 36; Creatinine, Ser 1.80; Potassium 4.1; Sodium 137  Personally reviewed   Wt Readings from Last 3 Encounters:  08/01/23 96.8 kg (213 lb 6.4 oz)  05/07/23 98 kg (216 lb)  10/05/22 94.4 kg (208 lb 3.2 oz)    BP (!) 154/80   Pulse 83   Wt 96.8 kg (213 lb 6.4 oz)   SpO2 94%   BMI 37.80 kg/m   Physical Exam:   General:  Obese elderly. No resp difficulty HEENT: normal Neck: supple. JVP to ear. Carotids 2+ bilat; no bruits. No lymphadenopathy or thryomegaly appreciated. Cor:  Regular rate & rhythm. 2/6 TR Lungs: clear Abdomen:markedly obese  soft, nontender, nondistended. No hepatosplenomegaly. No bruits or masses. Good bowel sounds. Extremities: no cyanosis, clubbing, rash, 3+ edema into thighs  le wraps Neuro: alert & orientedx3, cranial nerves grossly intact. moves all 4 extremities w/o difficulty. Affect pleasant   ReDs: 35%   Assessment & Plan:  1. Acute on Chronic Diastolic Heart Failure  - Echo 05/14/18: EF 50-55%, posterior basal and inferolateral HK, mild LVH, LA mildly dilated - R/LHC 05/19/18: normal coronaries, elevated filling pressure (had held diuretics x2 days), severe HTN. - RHC (2/22): Mild to moderate elevated filling pressures, mild pulm venous HTN, high CO with no shunting. - Echo (4/22): EF 60-65%, RV ok, mild AS mean gradient 21.0 mmHg - Echo (5/23): EF 60-65% G1DD RV  normal. RVSP 31 - Echo  9/24 EF 60-65% RV ok. Diastolic function indeterminate  - Chronically NYHA IIIb, functional status confounded by body habitus and general deconditioning. - Volume status markedly elevated today with R>>L HF  - We discussed AquaPass trial but she declined at this point - Treat with Furoscix x 5 days  - Give one dose metolazone 2.5 today + kcl 40 - Continue torsemide 40 mg daily. - Discussed fluid restriction - Off Jardiance due to vaginal irriation. - No ARB/ARNi/spiro with CKD. - Continue ace wraps - Labs today - See back next week  2. HTN, severe - BP elevated here. Will see if responds to diuresis - Continue hydralazine 100 mg tid. - Continue labetalol 200 mg bid.  3. Aortic Stenosis - Mild by echo 4/22, mean gradient 21 - Mild by echo 01/17/22, mean gradient 13  - AS mild on echo 9/24   4. OSA  - Severe, AHI 49 by PSG in 10/19 - f/u testing AHI 11 - Had televisit with Dr. Mayford Knife on 09/30/19. She refused CPAP due to claustrophobia.  - Repeat HST showed severe, AHI 31.1.  - Continues to refuse CPAP  5. Dyspnea - LHC 05/19/18 with normal coronaries. - PFTs with DLCO 70%. - RHC (2/22): Mild pulmonary venous hypertension - Suspect she is limited body habitus and general deconditioning but also has significant LE edema.   6. CKD Stage IV - Followed by [facility] (Dr. Malen Gauze) - Baseline SCr 2.0-2.4, most recent SCr 1.5-1.8 - No longer on SGLT2i   7. Sinus node dysfunction s/p MDT PPM - Follows with Dr Graciela Husbands.   8. DM - She is on insulin - Per PCP  9. Obesity - Needs weight loss.  - Body mass index is 37.8 kg/m.  - Failed Ozempic due to constipation.  - Discussed other options such as tirzepatide but she has declined  I spent a total of 45 minutes today: 1) reviewing the patient's medical records including previous charts, labs and recent notes from other providers; 2) examining the patient and counseling them on their medical issues/explaining the plan  of care; 3) adjusting meds as needed and 4) ordering lab work or other needed tests.      Arvilla Meres, MD  08/01/2023 11:03 AM  [facility] Failure [facility] [address] Heart and Vascular [place] [address] [phone] (office) [phone] (fax)

## 2023-08-06 ENCOUNTER — Encounter (HOSPITAL_COMMUNITY): Payer: Self-pay

## 2023-08-06 ENCOUNTER — Ambulatory Visit (HOSPITAL_COMMUNITY)
Admission: RE | Admit: 2023-08-06 | Discharge: 2023-08-06 | Disposition: A | Discharge status: Home / Self Care | Payer: Medicare Other | Source: Ambulatory Visit | Attending: Physician Assistant | Admitting: Physician Assistant

## 2023-08-06 ENCOUNTER — Ambulatory Visit: Payer: Medicare Other

## 2023-08-06 VITALS — BP 116/60 | HR 75 | Ht 60.0 in | Wt 210.2 lb

## 2023-08-06 DIAGNOSIS — I272 Pulmonary hypertension, unspecified: Secondary | ICD-10-CM | POA: Diagnosis not present

## 2023-08-06 DIAGNOSIS — I5033 Acute on chronic diastolic (congestive) heart failure: Secondary | ICD-10-CM | POA: Diagnosis not present

## 2023-08-06 DIAGNOSIS — Z85038 Personal history of other malignant neoplasm of large intestine: Secondary | ICD-10-CM | POA: Insufficient documentation

## 2023-08-06 DIAGNOSIS — I10 Essential (primary) hypertension: Secondary | ICD-10-CM

## 2023-08-06 DIAGNOSIS — Z87891 Personal history of nicotine dependence: Secondary | ICD-10-CM | POA: Insufficient documentation

## 2023-08-06 DIAGNOSIS — K219 Gastro-esophageal reflux disease without esophagitis: Secondary | ICD-10-CM | POA: Diagnosis not present

## 2023-08-06 DIAGNOSIS — K5900 Constipation, unspecified: Secondary | ICD-10-CM | POA: Insufficient documentation

## 2023-08-06 DIAGNOSIS — I130 Hypertensive heart and chronic kidney disease with heart failure and stage 1 through stage 4 chronic kidney disease, or unspecified chronic kidney disease: Secondary | ICD-10-CM | POA: Insufficient documentation

## 2023-08-06 DIAGNOSIS — E1140 Type 2 diabetes mellitus with diabetic neuropathy, unspecified: Secondary | ICD-10-CM | POA: Diagnosis not present

## 2023-08-06 DIAGNOSIS — I5032 Chronic diastolic (congestive) heart failure: Secondary | ICD-10-CM

## 2023-08-06 DIAGNOSIS — E1122 Type 2 diabetes mellitus with diabetic chronic kidney disease: Secondary | ICD-10-CM | POA: Diagnosis not present

## 2023-08-06 DIAGNOSIS — Z794 Long term (current) use of insulin: Secondary | ICD-10-CM | POA: Diagnosis not present

## 2023-08-06 DIAGNOSIS — I350 Nonrheumatic aortic (valve) stenosis: Secondary | ICD-10-CM | POA: Insufficient documentation

## 2023-08-06 DIAGNOSIS — I495 Sick sinus syndrome: Secondary | ICD-10-CM | POA: Insufficient documentation

## 2023-08-06 DIAGNOSIS — N184 Chronic kidney disease, stage 4 (severe): Secondary | ICD-10-CM | POA: Insufficient documentation

## 2023-08-06 DIAGNOSIS — E669 Obesity, unspecified: Secondary | ICD-10-CM | POA: Diagnosis not present

## 2023-08-06 DIAGNOSIS — G2581 Restless legs syndrome: Secondary | ICD-10-CM | POA: Insufficient documentation

## 2023-08-06 DIAGNOSIS — G4733 Obstructive sleep apnea (adult) (pediatric): Secondary | ICD-10-CM | POA: Insufficient documentation

## 2023-08-06 DIAGNOSIS — F40240 Claustrophobia: Secondary | ICD-10-CM | POA: Insufficient documentation

## 2023-08-06 DIAGNOSIS — Z950 Presence of cardiac pacemaker: Secondary | ICD-10-CM | POA: Insufficient documentation

## 2023-08-06 DIAGNOSIS — Z79899 Other long term (current) drug therapy: Secondary | ICD-10-CM | POA: Diagnosis not present

## 2023-08-06 DIAGNOSIS — Z6841 Body Mass Index (BMI) 40.0 and over, adult: Secondary | ICD-10-CM | POA: Insufficient documentation

## 2023-08-06 DIAGNOSIS — I5020 Unspecified systolic (congestive) heart failure: Secondary | ICD-10-CM

## 2023-08-06 LAB — COMPREHENSIVE METABOLIC PANEL
ALT: 20 U/L (ref 0–44)
AST: 17 U/L (ref 15–41)
Albumin: 3.5 g/dL (ref 3.5–5.0)
Alkaline Phosphatase: 61 U/L (ref 38–126)
Anion gap: 12 (ref 5–15)
BUN: 52 mg/dL — ABNORMAL HIGH (ref 8–23)
CO2: 27 mmol/L (ref 22–32)
Calcium: 9.2 mg/dL (ref 8.9–10.3)
Chloride: 98 mmol/L (ref 98–111)
Creatinine, Ser: 2.02 mg/dL — ABNORMAL HIGH (ref 0.44–1.00)
GFR, Estimated: 24 mL/min — ABNORMAL LOW (ref >60)
Glucose, Bld: 128 mg/dL — ABNORMAL HIGH (ref 70–99)
Potassium: 3.9 mmol/L (ref 3.5–5.1)
Sodium: 137 mmol/L (ref 135–145)
Total Bilirubin: 0.3 mg/dL (ref <1.2)
Total Protein: 6 g/dL — ABNORMAL LOW (ref 6.5–8.1)

## 2023-08-06 LAB — BRAIN NATRIURETIC PEPTIDE: B Natriuretic Peptide: 99.2 pg/mL (ref 0.0–100.0)

## 2023-08-06 MED ORDER — TORSEMIDE 20 MG PO TABS
60.0000 mg | ORAL_TABLET | Freq: Every day | ORAL | 11 refills | Status: DC
Start: 2023-08-06 — End: 2024-02-18

## 2023-08-06 NOTE — Progress Notes (Incomplete)
Advanced Heart Failure Clinic Note  PCP:  Aliene Beams, MD  Cardiologist:  Armanda Magic, MD Nephrology: Dr. Malen Gauze HF Cardiologist: Dr. Gala Romney  Chief Complaint: Heart Failure follow-up   HPI: [name] is a 82 y.o.Marland Kitchen female with history of diastolic heart failure, DM, GERD, colon cancer, CKD stage III, 2016 pacemaker (MDT) due to symptomatic sinus node dysfunction.  In 7/19, she had 12 pound weight and she was sent to the ED by her PCP. She was sent home on metolazone every other day for 14 days. She took 4 doses and her creatinine went up to 3 so metolazone was stopped.   R/L cath in 9/19. Normal cors. Elevated filling pressures with pulmonary venous HTN.   Had fall with R tibia fracture in 9/21    Having more dyspnea at follow up 2/22, arranged for RHC and repeat echo.  RHC (2/22) showed mild to moderate elevated filling pressures with prominent v waves in PCWP tracing, mild pulmonary HTN and high CO with no evidence of intracardiac shunting. Echo (4/22) showed EF 60-65%, RV ok, mild AS.  SCr remained elevated on lab check at EP visit 8/22 and irbesartan stopped.  Echo 01/17/22 EF 60-65% G1DD RV normal. RVSP 31  Follow up 9/23, chronically  NYHA IIIb, volume OK on torsemide 40 + metolazone once a week.  Echo 9/24 EF 60-65% RV ok. Diastolic function indeterminate   Seen in clinic on 08/01/23 with volume overload. Given 5 doses of furoscix and instructed to take metolazone X 1.  Here today for follow-up. Took her 4th dose of furoscix this am, missed one dose after she messed up the device. Has not been taking Torsemide in the afternoon and did not take metolazone as recommended. She was concerned about having incontinence. Reports dyspnea with exertion and leg swelling. Mobility decreasing gradually over the last few years. Back hurts after a few minutes of standing. Needs help with household chores.   Cardiac Studies: - Echo (5/23): EF 60-65%, grade I DD, RV normal,  RVSP 31  - Echo (4/22): EF 60-65%, RV ok, mild AS mean gradient 21.0 mmHg  - RHC (2/22):  RA = 9 RV = 49/13 PA = 49/17 (33) PCW = 22 ( v waves 28) Fick cardiac output/index = 8.0/3.9 PVR = 1.6 WU FA sat = 95% PA sat = 74%, 73% High SVC sat = 69%  Assessment: 1. Mild to moderately elevated filling pressures with prominent v waves in PCWP tracing suggestive of diastolic dysfunction versus mitral regurgitation 2. Mild pulmonary venous HTN 3. High cardiac output with no evidence of intracardiac shunting  - PFTs 05/23/18: FEV1: 1.72 FEV1/FVC ratio: 77% DLCO: 70%  - R/LHC 05/19/18: Ao = 192/74 (118) LV = 201/26 RA = 14 RV = 53/18 PA = 53/19 (38) PCW = 27 (v = 41) Fick cardiac output/index = 5.6/2.8 PVR = 2.0 WU Ao sat = 95% PA sat = 64%, 66%  1. Normal coronaries with ectactic vessels suggestive of longstanding HTN 2. Severe HTN 3. Normal LV function 4. Signficantly elevated filling pressures with pulmonary venous HTN in setting of holding diuretics for 2 days  Plan/Discussion: Filling pressures and BP elevated. Will resume diuretics. Will need aggressive titration of ant-HTN regimen.  - Echo 05/14/18: EF 50-55%, mild LVH - Aortic valve: Sclerosis without stenosis. - Mitral valve: Moderately calcified annulus. Moderately thickened,   mildly calcified leaflets . - Left atrium: The atrium was mildly dilated. - Atrial septum: No defect or patent foramen  ovale was identified.   -Sleep study (10/19): AHI 49  - Myoview (06/21/15): Nuclear stress EF: 64%. There was no ST segment deviation noted during stress. The study is normal. This is a low risk study. No ischemia identified.  SH: Former 30-40 pack year smoker, quit 25 years ago.   Past Medical History:  Diagnosis Date   Anxiety    Arthritis    Cancer (HCC)    colon   CHF (congestive heart failure) (HCC)    Chronic kidney disease    stage 3   Chronic pain syndrome    Diabetes (HCC)    Diabetic neuropathy  (HCC)    Dyslipidemia    Early cataracts, bilateral    Fibromyalgia    GERD (gastroesophageal reflux disease)    H/O syncope    Heart murmur    Hypertension    Lumbar spinal stenosis    and scoliosis   Pneumonia    PONV (postoperative nausea and vomiting)    Presence of permanent cardiac pacemaker    Restless legs    Spinal headache    Thyroid nodule    Past Surgical History:  Procedure Laterality Date   APPENDECTOMY     BREAST SURGERY     lumpectomy   COLON SURGERY     DILATION AND CURETTAGE OF UTERUS     EP IMPLANTABLE DEVICE N/A 07/21/2015   Procedure: Pacemaker Implant;  Surgeon: Duke Salvia, MD;  [facility] INVASIVE CV LAB;  Service: Cardiovascular;  Laterality: N/A;   LUMBAR LAMINECTOMY/DECOMPRESSION MICRODISCECTOMY N/A 02/02/2016   Procedure: DECOMPRESSION L4-L5 WITH INSITE 2 FUSION ;  Surgeon: Venita Lick, MD;  Location: [facility] OR;  Service: Orthopedics;  Laterality: N/A;   PARTIAL NEPHRECTOMY     RIGHT HEART CATH N/A 11/02/2020   Procedure: RIGHT HEART CATH;  Surgeon: Dolores Patty, MD;  Location: [facility] INVASIVE CV LAB;  Service: Cardiovascular;  Laterality: N/A;   RIGHT/LEFT HEART CATH AND CORONARY ANGIOGRAPHY N/A 05/19/2018   Procedure: RIGHT/LEFT HEART CATH AND CORONARY ANGIOGRAPHY;  Surgeon: Dolores Patty, MD;  Location: [facility] INVASIVE CV LAB;  Service: Cardiovascular;  Laterality: N/A;   TIBIA IM NAIL INSERTION Right 05/19/2020   Procedure: INTRAMEDULLARY (IM) NAIL TIBIAL;  Surgeon: Yolonda Kida, MD;  Location: [facility] OR;  Service: Orthopedics;  Laterality: Right;   TONSILLECTOMY     TUBAL LIGATION     Current Outpatient Medications  Medication Sig Dispense Refill   acetaminophen (TYLENOL) 650 MG CR tablet 2 tablets     B-D UF III MINI PEN NEEDLES 31G X 5 MM MISC      Blood Glucose Monitoring Suppl (ACCU-CHEK AVIVA PLUS) w/Device KIT      Calcium Carb-Cholecalciferol (CALCIUM 500 + D3) 500-15 MG-MCG TABS Take 2 tablets by mouth daily.      Cholecalciferol (VITAMIN D) 2000 UNITS tablet Take 2,000 Units by mouth daily.     Continuous Blood Gluc Sensor (FREESTYLE LIBRE 2 SENSOR) MISC See admin instructions.     cyclobenzaprine (FLEXERIL) 10 MG tablet Take 10 mg by mouth 3 (three) times daily as needed for muscle spasms.     diclofenac Sodium (VOLTAREN) 1 % GEL Apply 1 application topically at bedtime as needed (sleep).     diphenhydrAMINE (BENADRYL) 25 MG tablet Take 25 mg by mouth daily as needed for itching or allergies.     DULoxetine (CYMBALTA) 60 MG capsule Take 60 mg by mouth daily.     Furosemide (FUROSCIX) 80 MG/10ML CTKT Inject 80 mg into  the skin as directed.     gabapentin (NEURONTIN) 300 MG capsule Take 300 mg by mouth at bedtime.     hydrALAZINE (APRESOLINE) 100 MG tablet Take 1 tablet (100 mg total) by mouth 3 (three) times daily. 90 tablet 6   insulin degludec (TRESIBA FLEXTOUCH) 100 UNIT/ML FlexTouch Pen 45 Units as directed.     labetalol (NORMODYNE) 200 MG tablet Take 1 tablet (200 mg total) by mouth 2 (two) times daily. Absolute last refill without office visit please call [phone] to schedule follow up 180 tablet 2   linaCLOtide (LINZESS PO) Take 72 mcg by mouth daily.     oxyCODONE-acetaminophen (PERCOCET) 10-325 MG tablet Take 1 tablet by mouth every 4 (four) hours as needed for pain.     potassium chloride SA (KLOR-CON M) 10 MEQ tablet Take 2 tablets (20 mEq total) by mouth daily. Take 4 extra tab on [facility] with metolazone 60 tablet 11   pramipexole (MIRAPEX) 0.125 MG tablet Take 0.25 mg by mouth 2 (two) times daily.     rosuvastatin (CRESTOR) 10 MG tablet Take 10 mg by mouth daily.      metolazone (ZAROXOLYN) 2.5 MG tablet Take 1 tablet (2.5 mg total) by mouth once a week. Every [facility] (Patient not taking: Reported on 08/06/2023) 12 tablet 3   torsemide (DEMADEX) 20 MG tablet Take 3 tablets (60 mg total) by mouth daily. 90 tablet 11   No current facility-administered medications for this encounter.    Allergies:   Penicillins, Codeine, Adhesive [tape], Aspirin, Crab (diagnostic), Iodine, Ivp dye [iodinated contrast media], and Sulfa antibiotics   Social History:  The patient  reports that she quit smoking about 39 years ago. Her smoking use included cigarettes. She started smoking about 69 years ago. She has never used smokeless tobacco. She reports current alcohol use. She reports that she does not use drugs.   Family History:  The patient's family history includes CAD in an other family member; Sick sinus syndrome in her brother.   ROS:  Please see the history of present illness.   All other systems are personally reviewed and negative.   Recent Labs: 03/27/2023: Hemoglobin 12.8; Platelets 148 08/06/2023: ALT 20; B Natriuretic Peptide 99.2; BUN 52; Creatinine, Ser 2.02; Potassium 3.9; Sodium 137  Personally reviewed   Wt Readings from Last 3 Encounters:  08/06/23 95.3 kg (210 lb 3.2 oz)  08/01/23 96.8 kg (213 lb 6.4 oz)  05/07/23 98 kg (216 lb)    BP 116/60   Pulse 75   Ht 5' (1.524 m)   Wt 95.3 kg (210 lb 3.2 oz)   SpO2 94%   BMI 41.05 kg/m   Physical Exam:   General:  Elderly female, no distress. HEENT: normal Neck: supple. JVP to ear.  Cor: PMI nondisplaced. Regular rate & rhythm. No rubs, gallops or murmurs. Lungs: clear Abdomen: soft, nontender, nondistended. Good bowel sounds. Extremities: 2+ edema into thighs, + compression wraps Neuro: alert & orientedx3. moves all 4 extremities w/o difficulty. Affect pleasant  [employer] pacemaker interrogation: No AT/AF, no VT  Assessment & Plan:  1. Acute on Chronic Diastolic Heart Failure  - Echo 05/14/18: EF 50-55%, posterior basal and inferolateral HK, mild LVH, LA mildly dilated - R/LHC 05/19/18: normal coronaries, elevated filling pressure (had held diuretics x2 days), severe HTN. - RHC (2/22): Mild to moderate elevated filling pressures, mild pulm venous HTN, high CO with no shunting. - Echo (4/22): EF 60-65%, RV ok,  mild AS mean gradient 21.0 mmHg -  Echo (5/23): EF 60-65% G1DD RV normal. RVSP 31 - Echo 9/24 EF 60-65% RV ok. Diastolic function indeterminate  - Chronically NYHA IIIb, functional status confounded by body habitus and general deconditioning. - Volume status remains markedly elevated today with R>>L HF  - On day 4 of 5 of furoscix, last dose tomorrow. Recommended taking 40 mg Torsemide in afternoon next 2 days. Take 2.5 mg metolazone X 1 with an extra 40 mEq potassium.  - On 11/28 increase Torsemide to 60 mg daily.  - Reiterated fluid restriction - Off Jardiance due to vaginal irriation. - No ARB/ARNi/spiro with CKD. - Continue ace wraps - Labs today  2. HTN - BP controlled - Continue hydralazine 100 mg tid. - Continue labetalol 200 mg bid.  3. Aortic Stenosis - Mild by echo 4/22, mean gradient 21 - Mild by echo 01/17/22, mean gradient 13  - AS mild on echo 9/24   4. OSA  - Severe, AHI 49 by PSG in 10/19 - f/u testing AHI 11 - Had televisit with Dr. Mayford Knife on 09/30/19. She refused CPAP due to claustrophobia.  - Repeat HST showed severe, AHI 31.1.  - Continues to refuse CPAP  5. Dyspnea - LHC 05/19/18 with normal coronaries. - PFTs with DLCO 70%. - RHC (2/22): Mild pulmonary venous hypertension - Suspect she is limited body habitus and general deconditioning but also has significant LE edema.   6. CKD Stage IV - Followed by [facility] (Dr. Malen Gauze) - Baseline SCr 2.0-2.4, most recent SCr 1.5-1.8 -Check labs today - No longer on SGLT2i   7. Sinus node dysfunction s/p MDT PPM - Follows with Dr Graciela Husbands.   8. DM - She is on insulin - Per PCP  9. Obesity - Needs weight loss.  - Body mass index is 41.05 kg/m.  - Failed Ozempic due to constipation.  - Discussed other options such as tirzepatide but she has declined  Follow-up: 2 weeks with APP to assess volume and check labs     [facility], PA-C  08/07/2023 4:28 PM  Advanced Heart Failure Clinic [facility] [address] Heart and Vascular [place] [address] [phone] (office) [phone] (fax)

## 2023-08-06 NOTE — Patient Instructions (Signed)
Take torsemide 40 mg this afternoon. Furoscix tomorrow am and then Torsemide 40 mg in afternoon. Take 2.5 metolazone tomorrow. Then resume weekly doses. Take an extra 4 tablets of potassium with metolazone. Starting Thursday increase your Torsemide to 60 mg daily. Labs today - will call you if abnormal. [facility] to [facility] on December 11th - see below. Please call us at [phone] if any questions or concerns prior to next visit.

## 2023-08-07 ENCOUNTER — Encounter (HOSPITAL_COMMUNITY): Payer: Self-pay

## 2023-08-21 ENCOUNTER — Encounter (HOSPITAL_COMMUNITY): Payer: Medicare Other

## 2023-08-27 DIAGNOSIS — G894 Chronic pain syndrome: Secondary | ICD-10-CM | POA: Diagnosis not present

## 2023-08-27 DIAGNOSIS — M961 Postlaminectomy syndrome, not elsewhere classified: Secondary | ICD-10-CM | POA: Diagnosis not present

## 2023-09-05 NOTE — Progress Notes (Signed)
Remote pacemaker transmission.   

## 2023-09-11 ENCOUNTER — Emergency Department (HOSPITAL_COMMUNITY): Payer: Medicare Other

## 2023-09-11 ENCOUNTER — Other Ambulatory Visit: Payer: Self-pay

## 2023-09-11 ENCOUNTER — Emergency Department (HOSPITAL_COMMUNITY)
Admission: EM | Admit: 2023-09-11 | Discharge: 2023-09-11 | Disposition: A | Discharge status: Home / Self Care | Payer: Medicare Other | Attending: Emergency Medicine | Admitting: Emergency Medicine

## 2023-09-11 ENCOUNTER — Encounter (HOSPITAL_COMMUNITY): Payer: Self-pay | Admitting: Emergency Medicine

## 2023-09-11 DIAGNOSIS — I130 Hypertensive heart and chronic kidney disease with heart failure and stage 1 through stage 4 chronic kidney disease, or unspecified chronic kidney disease: Secondary | ICD-10-CM | POA: Insufficient documentation

## 2023-09-11 DIAGNOSIS — W1830XA Fall on same level, unspecified, initial encounter: Secondary | ICD-10-CM | POA: Insufficient documentation

## 2023-09-11 DIAGNOSIS — W19XXXA Unspecified fall, initial encounter: Secondary | ICD-10-CM | POA: Diagnosis not present

## 2023-09-11 DIAGNOSIS — E1122 Type 2 diabetes mellitus with diabetic chronic kidney disease: Secondary | ICD-10-CM | POA: Diagnosis not present

## 2023-09-11 DIAGNOSIS — Z794 Long term (current) use of insulin: Secondary | ICD-10-CM | POA: Diagnosis not present

## 2023-09-11 DIAGNOSIS — Z79899 Other long term (current) drug therapy: Secondary | ICD-10-CM | POA: Insufficient documentation

## 2023-09-11 DIAGNOSIS — I509 Heart failure, unspecified: Secondary | ICD-10-CM | POA: Insufficient documentation

## 2023-09-11 DIAGNOSIS — N189 Chronic kidney disease, unspecified: Secondary | ICD-10-CM | POA: Diagnosis not present

## 2023-09-11 DIAGNOSIS — S0003XA Contusion of scalp, initial encounter: Secondary | ICD-10-CM | POA: Diagnosis not present

## 2023-09-11 DIAGNOSIS — Z7984 Long term (current) use of oral hypoglycemic drugs: Secondary | ICD-10-CM | POA: Diagnosis not present

## 2023-09-11 DIAGNOSIS — S0101XA Laceration without foreign body of scalp, initial encounter: Secondary | ICD-10-CM | POA: Insufficient documentation

## 2023-09-11 DIAGNOSIS — M25511 Pain in right shoulder: Secondary | ICD-10-CM | POA: Diagnosis not present

## 2023-09-11 DIAGNOSIS — I10 Essential (primary) hypertension: Secondary | ICD-10-CM | POA: Diagnosis not present

## 2023-09-11 DIAGNOSIS — S0990XA Unspecified injury of head, initial encounter: Secondary | ICD-10-CM | POA: Diagnosis not present

## 2023-09-11 DIAGNOSIS — R220 Localized swelling, mass and lump, head: Secondary | ICD-10-CM | POA: Diagnosis not present

## 2023-09-11 DIAGNOSIS — E1140 Type 2 diabetes mellitus with diabetic neuropathy, unspecified: Secondary | ICD-10-CM | POA: Insufficient documentation

## 2023-09-11 MED ORDER — LIDOCAINE-EPINEPHRINE (PF) 2 %-1:200000 IJ SOLN
10.0000 mL | Freq: Once | INTRAMUSCULAR | Status: AC
  Administered 2023-09-11: 10 mL
  Filled 2023-09-11: qty 20

## 2023-09-11 NOTE — ED Provider Triage Note (Signed)
 Emergency Medicine Provider Triage Evaluation Note  [name] , a 83 y.o. female  was evaluated in triage.  Pt complains of fall and head injury.  Using walker going through the house and fell backwards, struck her head on end table.  No LOC.  Wound to back of head. Not on anticoagulation.  AAO to baseline currently.  Review of Systems  Positive: Head injury, LOC Negative: fever  Physical Exam  BP (!) 167/63   Pulse 71   Temp 98.6 F (37 C) (Oral)   Resp 18   SpO2 96%  Gen:   Awake, no distress   Resp:  Normal effort  MSK:   Moves extremities without difficulty  Other:  Wound to back of head, bandaged, blood present on clothing.  AAOx3, able to answer questions and follow commands.   Medical Decision Making  Medically screening exam initiated at 2:56 AM.  Appropriate orders placed.  [name] was informed that the remainder of the evaluation will be completed by another provider, this initial triage assessment does not replace that evaluation, and the importance of remaining in the ED until their evaluation is complete.  Head injury w/out LOC.  Not on anticoagulation.  VSS.  CT head/cervical spine ordered.   [name] Olam HERO, PA-C 09/11/23 [phone]

## 2023-09-11 NOTE — ED Triage Notes (Signed)
 BIB EMS from home.  Mechanical fall onto dresser, lac to back of head.  Bleeding. No thinners.  No other injuries.  No LOC.  BP 160/78, HR 77, 94%, CBG 234

## 2023-09-11 NOTE — ED Provider Notes (Addendum)
 [place] [facility] AT [facility] Provider Note   CSN: [id number] Arrival date & time: 09/11/23  0250     History  Chief Complaint  Patient presents with   [name]    Kalene [name] is a 83 y.o. female.   Fall     83 year old female with medical history significant for HTN, HLD, diabetes mellitus, CKD, GERD, diabetic neuropathy, fibromyalgia, CHF, presenting to the emergency department after a ground-level fall.  The patient ambulates with a walker and was going through her house and lost her balance falling backwards striking the posterior aspect of her scalp on an end table.  No loss of consciousness.  [name] is not on anticoagulation.  [name] sustained a quarter sized stellate laceration to the posterior scalp.  [name] arrives GCS 15, ABC intact.  [name] denies any other injuries.  Home Medications Prior to Admission medications   Medication Sig Start Date End Date Taking? Authorizing Provider  acetaminophen  (TYLENOL ) 650 MG CR tablet 2 tablets    [provider]  B-D UF III MINI PEN NEEDLES 31G X 5 MM MISC  09/16/19   [provider]  Blood Glucose Monitoring Suppl (ACCU-CHEK AVIVA PLUS) w/Device KIT  02/08/20   [provider]  Calcium  Carb-Cholecalciferol  (CALCIUM  500 + D3) 500-15 MG-MCG TABS Take 2 tablets by mouth daily.    [provider]  Cholecalciferol  (VITAMIN D ) 2000 UNITS tablet Take 2,000 Units by mouth daily.    [provider]  Continuous Blood Gluc Sensor (FREESTYLE LIBRE 2 SENSOR) MISC See admin instructions.    [provider]  cyclobenzaprine  (FLEXERIL ) 10 MG tablet Take 10 mg by mouth 3 (three) times daily as needed for muscle spasms.    [provider]  diclofenac Sodium (VOLTAREN) 1 % GEL Apply 1 application topically at bedtime as needed (sleep).    [provider]  diphenhydrAMINE  (BENADRYL ) 25 MG tablet Take 25 mg by mouth daily as needed for itching or allergies.    [provider]  DULoxetine  (CYMBALTA ) 60 MG capsule Take 60 mg by mouth daily.    [provider]  Furosemide  (FUROSCIX ) 80 MG/10ML CTKT Inject 80 mg into the skin as directed. 08/01/23   Bensimhon, Toribio SAUNDERS, MD  gabapentin  (NEURONTIN ) 300 MG capsule Take 300 mg by mouth at bedtime. 03/28/20   [provider]  hydrALAZINE  (APRESOLINE ) 100 MG tablet Take 1 tablet (100 mg total) by mouth 3 (three) times daily. 08/09/20   Bensimhon, Toribio SAUNDERS, MD  insulin  degludec (TRESIBA FLEXTOUCH) 100 UNIT/ML FlexTouch Pen 45 Units as directed.    [provider]  labetalol  (NORMODYNE ) 200 MG tablet Take 1 tablet (200 mg total) by mouth 2 (two) times daily. Absolute last refill without office visit please call [phone] to schedule follow up 03/07/23   Bensimhon, Daniel R, MD  linaCLOtide (LINZESS PO) Take 72 mcg by mouth daily.    [provider]  metolazone  (ZAROXOLYN ) 2.5 MG tablet Take 1 tablet (2.5 mg total) by mouth once a week. Every WEDNESDAY Patient not taking: Reported on 08/06/2023 05/07/23   Glena Harlene HERO, FNP  oxyCODONE -acetaminophen  (PERCOCET) 10-325 MG tablet Take 1 tablet by mouth every 4 (four) hours as needed for pain.    [provider]  potassium chloride  SA (KLOR-CON  M) 10 MEQ tablet Take 2 tablets (20 mEq total) by mouth daily. Take 4 extra tab on [facility] with metolazone  05/17/23   [place], Harlene HERO, FNP  pramipexole  (MIRAPEX ) 0.125 MG tablet Take  0.25 mg by mouth 2 (two) times daily.    [provider]  rosuvastatin  (CRESTOR ) 10 MG tablet Take 10 mg by mouth daily.  06/23/19   [provider]  torsemide  (DEMADEX ) 20 MG tablet Take 3 tablets (60 mg total) by mouth daily. 08/06/23   Colletta Manuelita Garre, PA-C      Allergies    Penicillins, Codeine, Adhesive [tape], Aspirin , Crab (diagnostic), Iodine, Ivp dye [iodinated contrast media], and Sulfa antibiotics    Review of Systems   Review of Systems  All other systems  reviewed and are negative.   Physical Exam Updated Vital Signs BP (!) 172/56   Pulse 71   Temp 98.7 F (37.1 C)   Resp (!) 22   SpO2 98%  Physical Exam Vitals and nursing note reviewed.  Constitutional:      General: [name] is not in acute distress.    Appearance: [name] is well-developed.     Comments: GCS 15, ABC intact  HENT:     Head: Normocephalic.     Comments: 2cm stellate laceration to the posterior scalp, hemostatic Eyes:     Extraocular Movements: Extraocular movements intact.     Conjunctiva/sclera: Conjunctivae normal.     Pupils: Pupils are equal, round, and reactive to light.  Neck:     Comments: No midline tenderness to palpation of the cervical spine.  Range of motion intact Cardiovascular:     Rate and Rhythm: Normal rate and regular rhythm.  Pulmonary:     Effort: Pulmonary effort is normal. No respiratory distress.     Breath sounds: Normal breath sounds.  Chest:     Comments: Clavicles stable nontender to AP compression.  Chest wall stable and nontender to AP and lateral compression. Abdominal:     Palpations: Abdomen is soft.     Tenderness: There is no abdominal tenderness.     Comments: Pelvis stable to lateral compression  Musculoskeletal:     Cervical back: Neck supple.     Comments: No midline tenderness to palpation of the thoracic or lumbar spine.  Extremities atraumatic with intact range of motion  Skin:    General: Skin is warm and dry.  Neurological:     Mental Status: [name] is alert.     Comments: Cranial nerves II through XII grossly intact.  Moving all 4 extremities spontaneously.  Sensation grossly intact all 4 extremities     ED Results / Procedures / Treatments   Labs (all labs ordered are listed, but only abnormal results are displayed) Labs Reviewed - No data to display  EKG None  Radiology CT HEAD WO CONTRAST ( ) Result Date: 09/11/2023 CLINICAL DATA:  Blunt facial trauma. Mechanical fall with posterior head laceration. EXAM:  CT HEAD WITHOUT CONTRAST CT CERVICAL SPINE WITHOUT CONTRAST TECHNIQUE: Multidetector CT imaging of the head and cervical spine was performed following the standard protocol without intravenous contrast. Multiplanar CT image reconstructions of the cervical spine were also generated. RADIATION DOSE REDUCTION: This exam was performed according to the departmental dose-optimization program which includes automated exposure control, adjustment of the mA and/or kV according to patient size and/or use of iterative reconstruction technique. COMPARISON:  None Available. FINDINGS: CT HEAD FINDINGS Brain: No evidence of acute infarction, hemorrhage, hydrocephalus, extra-axial collection or mass lesion/mass effect. Vascular: No hyperdense vessel or unexpected calcification. Skull: Left posterior scalp hematoma without acute fracture. Sinuses/Orbits: No evidence of injury CT CERVICAL SPINE FINDINGS Alignment: No traumatic malalignment Skull base and vertebrae: No acute fracture Soft tissues  and spinal canal: No prevertebral fluid or swelling. No visible canal hematoma. Disc levels: Generalized degenerative endplate and facet spurring with multilevel degenerative disc collapse and foraminal narrowing. Upper chest: No evidence of injury IMPRESSION: 1. No evidence of acute intracranial or cervical spine injury. 2. Left posterior scalp swelling without calvarial fracture. Electronically Signed   By: Dorn Roulette M.D.   On: 09/11/2023 05:04   CT Cervical Spine Wo Contrast Result Date: 09/11/2023 CLINICAL DATA:  Blunt facial trauma. Mechanical fall with posterior head laceration. EXAM: CT HEAD WITHOUT CONTRAST CT CERVICAL SPINE WITHOUT CONTRAST TECHNIQUE: Multidetector CT imaging of the head and cervical spine was performed following the standard protocol without intravenous contrast. Multiplanar CT image reconstructions of the cervical spine were also generated. RADIATION DOSE REDUCTION: This exam was performed according to the  departmental dose-optimization program which includes automated exposure control, adjustment of the mA and/or kV according to patient size and/or use of iterative reconstruction technique. COMPARISON:  None Available. FINDINGS: CT HEAD FINDINGS Brain: No evidence of acute infarction, hemorrhage, hydrocephalus, extra-axial collection or mass lesion/mass effect. Vascular: No hyperdense vessel or unexpected calcification. Skull: Left posterior scalp hematoma without acute fracture. Sinuses/Orbits: No evidence of injury CT CERVICAL SPINE FINDINGS Alignment: No traumatic malalignment Skull base and vertebrae: No acute fracture Soft tissues and spinal canal: No prevertebral fluid or swelling. No visible canal hematoma. Disc levels: Generalized degenerative endplate and facet spurring with multilevel degenerative disc collapse and foraminal narrowing. Upper chest: No evidence of injury IMPRESSION: 1. No evidence of acute intracranial or cervical spine injury. 2. Left posterior scalp swelling without calvarial fracture. Electronically Signed   By: Dorn Roulette M.D.   On: 09/11/2023 05:04    Procedures .Laceration Repair  Date/Time: 09/11/2023 10:16 AM  Performed by: Jerrol Agent, MD Authorized by: Jerrol Agent, MD   Consent:    Consent obtained:  Verbal   Consent given by:  Patient   Risks discussed:  Infection, pain, poor cosmetic result and poor wound healing Universal protocol:    Patient identity confirmed:  Verbally with patient and arm band Anesthesia:    Anesthesia method:  Local infiltration   Local anesthetic:  Lidocaine  1% WITH epi Laceration details:    Location:  Scalp   Scalp location:  Occipital   Length (cm):  2 Treatment:    Area cleansed with:  Saline   Amount of cleaning:  Standard   Irrigation solution:  Sterile saline Skin repair:    Repair method:  Sutures   Suture size:  3-0   Suture material:  Prolene   Suture technique:  Simple interrupted   Number of sutures:   3 Approximation:    Approximation:  Close Repair type:    Repair type:  Simple Post-procedure details:    Dressing:  Bulky dressing   Procedure completion:  Tolerated     Medications Ordered in ED Medications  lidocaine -EPINEPHrine  (XYLOCAINE  W/EPI) 2 %-1:200000 (PF) injection 10 mL (10 mLs Infiltration Given 09/11/23 1032)    ED Course/ Medical Decision Making/ A&P                                 Medical Decision Making Risk Prescription drug management.    83 year old female with medical history significant for HTN, HLD, diabetes mellitus, CKD, GERD, diabetic neuropathy, fibromyalgia, CHF, presenting to the emergency department after a ground-level fall.  The patient ambulates with a walker and was going through her  house and lost her balance falling backwards striking the posterior aspect of her scalp on an end table.  No loss of consciousness.  [name] is not on anticoagulation.  [name] sustained a quarter sized stellate laceration to the posterior scalp.  [name] arrives GCS 15, ABC intact.  [name] denies any other injuries.  On arrival, the patient was vitally stable, afebrile, not tachycardic or tachypneic, hypertensive BP 167/63, saturating 96% on room air.  Physical exam revealed a stellate laceration to the posterior occiput.  No other injuries identified on exam.  CT imaging of the head and cervical spine was performed revealed no evidence of acute fracture or dislocation, no acute intracranial abnormality. IMPRESSION:  1. No evidence of acute intracranial or cervical spine injury.  2. Left posterior scalp swelling without calvarial fracture.    The patient's tetanus is up-to-date per medical record review.  The wound was cleaned by nursing staff bedside.  Given the size of the stellate laceration, not amenable to closure by staples, sutures were then applied for wound closure as per the procedure note above.  Patient pain controlled, no other evidence of traumatic injury on primary or  secondary survey, overall stable for discharge at this time.  Return precautions provided, patient advised to follow-up with her PCP, urgent care or the emergency department for wound reassessment and suture removal.  Final Clinical Impression(s) / ED Diagnoses Final diagnoses:  Fall, initial encounter  Laceration of scalp, initial encounter    Rx / DC Orders ED Discharge Orders     None         Jerrol Agent, MD 09/11/23 1126    Jerrol Agent, MD 09/11/23 1126

## 2023-09-11 NOTE — ED Notes (Signed)
 Contacted [facility] agency per pt request. [facility], [phone]

## 2023-09-17 ENCOUNTER — Telehealth (HOSPITAL_COMMUNITY): Payer: Self-pay

## 2023-09-17 NOTE — Telephone Encounter (Signed)
 Called to confirm/remind patient of their appointment at [facility] on 09/18/23.   Patient reminded to bring all medications and/or complete list.  Confirmed patient has transportation. Gave directions, instructed to utilize valet parking.  Confirmed appointment prior to ending call.

## 2023-09-18 ENCOUNTER — Encounter (HOSPITAL_COMMUNITY): Payer: Medicare Other

## 2023-09-19 DIAGNOSIS — N184 Chronic kidney disease, stage 4 (severe): Secondary | ICD-10-CM | POA: Diagnosis not present

## 2023-09-19 DIAGNOSIS — I890 Lymphedema, not elsewhere classified: Secondary | ICD-10-CM | POA: Diagnosis not present

## 2023-09-19 DIAGNOSIS — E1122 Type 2 diabetes mellitus with diabetic chronic kidney disease: Secondary | ICD-10-CM | POA: Diagnosis not present

## 2023-09-19 DIAGNOSIS — I495 Sick sinus syndrome: Secondary | ICD-10-CM | POA: Diagnosis not present

## 2023-09-19 DIAGNOSIS — S0101XD Laceration without foreign body of scalp, subsequent encounter: Secondary | ICD-10-CM | POA: Diagnosis not present

## 2023-09-19 DIAGNOSIS — R001 Bradycardia, unspecified: Secondary | ICD-10-CM | POA: Diagnosis not present

## 2023-09-19 DIAGNOSIS — E1142 Type 2 diabetes mellitus with diabetic polyneuropathy: Secondary | ICD-10-CM | POA: Diagnosis not present

## 2023-09-19 DIAGNOSIS — I130 Hypertensive heart and chronic kidney disease with heart failure and stage 1 through stage 4 chronic kidney disease, or unspecified chronic kidney disease: Secondary | ICD-10-CM | POA: Diagnosis not present

## 2023-09-19 DIAGNOSIS — E1159 Type 2 diabetes mellitus with other circulatory complications: Secondary | ICD-10-CM | POA: Diagnosis not present

## 2023-09-19 DIAGNOSIS — I5030 Unspecified diastolic (congestive) heart failure: Secondary | ICD-10-CM | POA: Diagnosis not present

## 2023-09-19 DIAGNOSIS — I499 Cardiac arrhythmia, unspecified: Secondary | ICD-10-CM | POA: Diagnosis not present

## 2023-09-20 DIAGNOSIS — E1142 Type 2 diabetes mellitus with diabetic polyneuropathy: Secondary | ICD-10-CM | POA: Diagnosis not present

## 2023-09-20 DIAGNOSIS — M545 Low back pain, unspecified: Secondary | ICD-10-CM | POA: Diagnosis not present

## 2023-09-20 DIAGNOSIS — S81802D Unspecified open wound, left lower leg, subsequent encounter: Secondary | ICD-10-CM | POA: Diagnosis not present

## 2023-09-20 DIAGNOSIS — Z4802 Encounter for removal of sutures: Secondary | ICD-10-CM | POA: Diagnosis not present

## 2023-09-20 DIAGNOSIS — E1122 Type 2 diabetes mellitus with diabetic chronic kidney disease: Secondary | ICD-10-CM | POA: Diagnosis not present

## 2023-09-20 DIAGNOSIS — S0101XD Laceration without foreign body of scalp, subsequent encounter: Secondary | ICD-10-CM | POA: Diagnosis not present

## 2023-09-20 DIAGNOSIS — E1159 Type 2 diabetes mellitus with other circulatory complications: Secondary | ICD-10-CM | POA: Diagnosis not present

## 2023-09-20 DIAGNOSIS — Z09 Encounter for follow-up examination after completed treatment for conditions other than malignant neoplasm: Secondary | ICD-10-CM | POA: Diagnosis not present

## 2023-09-20 DIAGNOSIS — I5030 Unspecified diastolic (congestive) heart failure: Secondary | ICD-10-CM | POA: Diagnosis not present

## 2023-09-20 DIAGNOSIS — I130 Hypertensive heart and chronic kidney disease with heart failure and stage 1 through stage 4 chronic kidney disease, or unspecified chronic kidney disease: Secondary | ICD-10-CM | POA: Diagnosis not present

## 2023-09-20 DIAGNOSIS — I10 Essential (primary) hypertension: Secondary | ICD-10-CM | POA: Diagnosis not present

## 2023-09-20 DIAGNOSIS — N3000 Acute cystitis without hematuria: Secondary | ICD-10-CM | POA: Diagnosis not present

## 2023-09-20 DIAGNOSIS — N184 Chronic kidney disease, stage 4 (severe): Secondary | ICD-10-CM | POA: Diagnosis not present

## 2023-09-20 DIAGNOSIS — I509 Heart failure, unspecified: Secondary | ICD-10-CM | POA: Diagnosis not present

## 2023-09-24 DIAGNOSIS — E1142 Type 2 diabetes mellitus with diabetic polyneuropathy: Secondary | ICD-10-CM | POA: Diagnosis not present

## 2023-09-24 DIAGNOSIS — N184 Chronic kidney disease, stage 4 (severe): Secondary | ICD-10-CM | POA: Diagnosis not present

## 2023-09-24 DIAGNOSIS — S0101XD Laceration without foreign body of scalp, subsequent encounter: Secondary | ICD-10-CM | POA: Diagnosis not present

## 2023-09-24 DIAGNOSIS — I130 Hypertensive heart and chronic kidney disease with heart failure and stage 1 through stage 4 chronic kidney disease, or unspecified chronic kidney disease: Secondary | ICD-10-CM | POA: Diagnosis not present

## 2023-09-24 DIAGNOSIS — E1122 Type 2 diabetes mellitus with diabetic chronic kidney disease: Secondary | ICD-10-CM | POA: Diagnosis not present

## 2023-09-24 DIAGNOSIS — I5030 Unspecified diastolic (congestive) heart failure: Secondary | ICD-10-CM | POA: Diagnosis not present

## 2023-09-25 DIAGNOSIS — M961 Postlaminectomy syndrome, not elsewhere classified: Secondary | ICD-10-CM | POA: Diagnosis not present

## 2023-09-25 DIAGNOSIS — M5416 Radiculopathy, lumbar region: Secondary | ICD-10-CM | POA: Diagnosis not present

## 2023-09-25 DIAGNOSIS — E876 Hypokalemia: Secondary | ICD-10-CM | POA: Diagnosis not present

## 2023-09-25 DIAGNOSIS — I5030 Unspecified diastolic (congestive) heart failure: Secondary | ICD-10-CM | POA: Diagnosis not present

## 2023-09-25 DIAGNOSIS — N184 Chronic kidney disease, stage 4 (severe): Secondary | ICD-10-CM | POA: Diagnosis not present

## 2023-09-25 DIAGNOSIS — E1142 Type 2 diabetes mellitus with diabetic polyneuropathy: Secondary | ICD-10-CM | POA: Diagnosis not present

## 2023-09-25 DIAGNOSIS — S0101XD Laceration without foreign body of scalp, subsequent encounter: Secondary | ICD-10-CM | POA: Diagnosis not present

## 2023-09-25 DIAGNOSIS — E1122 Type 2 diabetes mellitus with diabetic chronic kidney disease: Secondary | ICD-10-CM | POA: Diagnosis not present

## 2023-09-25 DIAGNOSIS — G894 Chronic pain syndrome: Secondary | ICD-10-CM | POA: Diagnosis not present

## 2023-09-25 DIAGNOSIS — N2581 Secondary hyperparathyroidism of renal origin: Secondary | ICD-10-CM | POA: Diagnosis not present

## 2023-09-25 DIAGNOSIS — I129 Hypertensive chronic kidney disease with stage 1 through stage 4 chronic kidney disease, or unspecified chronic kidney disease: Secondary | ICD-10-CM | POA: Diagnosis not present

## 2023-09-25 DIAGNOSIS — I130 Hypertensive heart and chronic kidney disease with heart failure and stage 1 through stage 4 chronic kidney disease, or unspecified chronic kidney disease: Secondary | ICD-10-CM | POA: Diagnosis not present

## 2023-09-25 LAB — LAB REPORT - SCANNED: EGFR: 40

## 2023-09-27 DIAGNOSIS — R531 Weakness: Secondary | ICD-10-CM | POA: Diagnosis not present

## 2023-09-27 DIAGNOSIS — E1142 Type 2 diabetes mellitus with diabetic polyneuropathy: Secondary | ICD-10-CM | POA: Diagnosis not present

## 2023-09-27 DIAGNOSIS — E1122 Type 2 diabetes mellitus with diabetic chronic kidney disease: Secondary | ICD-10-CM | POA: Diagnosis not present

## 2023-09-27 DIAGNOSIS — S0101XD Laceration without foreign body of scalp, subsequent encounter: Secondary | ICD-10-CM | POA: Diagnosis not present

## 2023-09-27 DIAGNOSIS — N184 Chronic kidney disease, stage 4 (severe): Secondary | ICD-10-CM | POA: Diagnosis not present

## 2023-09-27 DIAGNOSIS — I5030 Unspecified diastolic (congestive) heart failure: Secondary | ICD-10-CM | POA: Diagnosis not present

## 2023-09-27 DIAGNOSIS — I130 Hypertensive heart and chronic kidney disease with heart failure and stage 1 through stage 4 chronic kidney disease, or unspecified chronic kidney disease: Secondary | ICD-10-CM | POA: Diagnosis not present

## 2023-09-27 DIAGNOSIS — I509 Heart failure, unspecified: Secondary | ICD-10-CM | POA: Diagnosis not present

## 2023-09-27 DIAGNOSIS — E11628 Type 2 diabetes mellitus with other skin complications: Secondary | ICD-10-CM | POA: Diagnosis not present

## 2023-09-27 DIAGNOSIS — I10 Essential (primary) hypertension: Secondary | ICD-10-CM | POA: Diagnosis not present

## 2023-10-01 DIAGNOSIS — N184 Chronic kidney disease, stage 4 (severe): Secondary | ICD-10-CM | POA: Diagnosis not present

## 2023-10-01 DIAGNOSIS — M5416 Radiculopathy, lumbar region: Secondary | ICD-10-CM | POA: Diagnosis not present

## 2023-10-01 DIAGNOSIS — E1142 Type 2 diabetes mellitus with diabetic polyneuropathy: Secondary | ICD-10-CM | POA: Diagnosis not present

## 2023-10-01 DIAGNOSIS — I130 Hypertensive heart and chronic kidney disease with heart failure and stage 1 through stage 4 chronic kidney disease, or unspecified chronic kidney disease: Secondary | ICD-10-CM | POA: Diagnosis not present

## 2023-10-01 DIAGNOSIS — S0101XD Laceration without foreign body of scalp, subsequent encounter: Secondary | ICD-10-CM | POA: Diagnosis not present

## 2023-10-01 DIAGNOSIS — E1122 Type 2 diabetes mellitus with diabetic chronic kidney disease: Secondary | ICD-10-CM | POA: Diagnosis not present

## 2023-10-01 DIAGNOSIS — I5030 Unspecified diastolic (congestive) heart failure: Secondary | ICD-10-CM | POA: Diagnosis not present

## 2023-10-03 DIAGNOSIS — I5030 Unspecified diastolic (congestive) heart failure: Secondary | ICD-10-CM | POA: Diagnosis not present

## 2023-10-03 DIAGNOSIS — E1142 Type 2 diabetes mellitus with diabetic polyneuropathy: Secondary | ICD-10-CM | POA: Diagnosis not present

## 2023-10-03 DIAGNOSIS — E1122 Type 2 diabetes mellitus with diabetic chronic kidney disease: Secondary | ICD-10-CM | POA: Diagnosis not present

## 2023-10-03 DIAGNOSIS — N184 Chronic kidney disease, stage 4 (severe): Secondary | ICD-10-CM | POA: Diagnosis not present

## 2023-10-03 DIAGNOSIS — S0101XD Laceration without foreign body of scalp, subsequent encounter: Secondary | ICD-10-CM | POA: Diagnosis not present

## 2023-10-03 DIAGNOSIS — I130 Hypertensive heart and chronic kidney disease with heart failure and stage 1 through stage 4 chronic kidney disease, or unspecified chronic kidney disease: Secondary | ICD-10-CM | POA: Diagnosis not present

## 2023-10-04 DIAGNOSIS — E1122 Type 2 diabetes mellitus with diabetic chronic kidney disease: Secondary | ICD-10-CM | POA: Diagnosis not present

## 2023-10-04 DIAGNOSIS — N184 Chronic kidney disease, stage 4 (severe): Secondary | ICD-10-CM | POA: Diagnosis not present

## 2023-10-04 DIAGNOSIS — E1142 Type 2 diabetes mellitus with diabetic polyneuropathy: Secondary | ICD-10-CM | POA: Diagnosis not present

## 2023-10-04 DIAGNOSIS — I5030 Unspecified diastolic (congestive) heart failure: Secondary | ICD-10-CM | POA: Diagnosis not present

## 2023-10-04 DIAGNOSIS — I130 Hypertensive heart and chronic kidney disease with heart failure and stage 1 through stage 4 chronic kidney disease, or unspecified chronic kidney disease: Secondary | ICD-10-CM | POA: Diagnosis not present

## 2023-10-04 DIAGNOSIS — S0101XD Laceration without foreign body of scalp, subsequent encounter: Secondary | ICD-10-CM | POA: Diagnosis not present

## 2023-10-08 DIAGNOSIS — I130 Hypertensive heart and chronic kidney disease with heart failure and stage 1 through stage 4 chronic kidney disease, or unspecified chronic kidney disease: Secondary | ICD-10-CM | POA: Diagnosis not present

## 2023-10-08 DIAGNOSIS — I5030 Unspecified diastolic (congestive) heart failure: Secondary | ICD-10-CM | POA: Diagnosis not present

## 2023-10-08 DIAGNOSIS — S0101XD Laceration without foreign body of scalp, subsequent encounter: Secondary | ICD-10-CM | POA: Diagnosis not present

## 2023-10-08 DIAGNOSIS — E1142 Type 2 diabetes mellitus with diabetic polyneuropathy: Secondary | ICD-10-CM | POA: Diagnosis not present

## 2023-10-08 DIAGNOSIS — N184 Chronic kidney disease, stage 4 (severe): Secondary | ICD-10-CM | POA: Diagnosis not present

## 2023-10-08 DIAGNOSIS — E1122 Type 2 diabetes mellitus with diabetic chronic kidney disease: Secondary | ICD-10-CM | POA: Diagnosis not present

## 2023-10-09 DIAGNOSIS — I5030 Unspecified diastolic (congestive) heart failure: Secondary | ICD-10-CM | POA: Diagnosis not present

## 2023-10-09 DIAGNOSIS — I130 Hypertensive heart and chronic kidney disease with heart failure and stage 1 through stage 4 chronic kidney disease, or unspecified chronic kidney disease: Secondary | ICD-10-CM | POA: Diagnosis not present

## 2023-10-09 DIAGNOSIS — E1122 Type 2 diabetes mellitus with diabetic chronic kidney disease: Secondary | ICD-10-CM | POA: Diagnosis not present

## 2023-10-09 DIAGNOSIS — E1142 Type 2 diabetes mellitus with diabetic polyneuropathy: Secondary | ICD-10-CM | POA: Diagnosis not present

## 2023-10-09 DIAGNOSIS — S0101XD Laceration without foreign body of scalp, subsequent encounter: Secondary | ICD-10-CM | POA: Diagnosis not present

## 2023-10-09 DIAGNOSIS — N184 Chronic kidney disease, stage 4 (severe): Secondary | ICD-10-CM | POA: Diagnosis not present

## 2023-10-10 ENCOUNTER — Telehealth (HOSPITAL_COMMUNITY): Payer: Self-pay

## 2023-10-10 DIAGNOSIS — N184 Chronic kidney disease, stage 4 (severe): Secondary | ICD-10-CM | POA: Diagnosis not present

## 2023-10-10 DIAGNOSIS — E1142 Type 2 diabetes mellitus with diabetic polyneuropathy: Secondary | ICD-10-CM | POA: Diagnosis not present

## 2023-10-10 DIAGNOSIS — E1122 Type 2 diabetes mellitus with diabetic chronic kidney disease: Secondary | ICD-10-CM | POA: Diagnosis not present

## 2023-10-10 DIAGNOSIS — I5030 Unspecified diastolic (congestive) heart failure: Secondary | ICD-10-CM | POA: Diagnosis not present

## 2023-10-10 DIAGNOSIS — S0101XD Laceration without foreign body of scalp, subsequent encounter: Secondary | ICD-10-CM | POA: Diagnosis not present

## 2023-10-10 DIAGNOSIS — I130 Hypertensive heart and chronic kidney disease with heart failure and stage 1 through stage 4 chronic kidney disease, or unspecified chronic kidney disease: Secondary | ICD-10-CM | POA: Diagnosis not present

## 2023-10-10 NOTE — Telephone Encounter (Signed)
Called to confirm/remind patient of their appointment at the [facility] on 10/11/23.   Patient reminded to bring all medications and/or complete list.  Confirmed patient has transportation. Gave directions, instructed to utilize valet parking.  Confirmed appointment prior to ending call.

## 2023-10-11 ENCOUNTER — Encounter (HOSPITAL_COMMUNITY): Payer: Self-pay

## 2023-10-11 ENCOUNTER — Ambulatory Visit (HOSPITAL_COMMUNITY)
Admission: RE | Admit: 2023-10-11 | Discharge: 2023-10-11 | Disposition: A | Discharge status: Home / Self Care | Payer: Medicare Other | Source: Ambulatory Visit | Attending: Family Medicine | Admitting: Family Medicine

## 2023-10-11 VITALS — BP 140/68 | HR 86 | Wt 199.8 lb

## 2023-10-11 DIAGNOSIS — N183 Chronic kidney disease, stage 3 unspecified: Secondary | ICD-10-CM | POA: Diagnosis present

## 2023-10-11 DIAGNOSIS — Z950 Presence of cardiac pacemaker: Secondary | ICD-10-CM | POA: Diagnosis not present

## 2023-10-11 DIAGNOSIS — I5032 Chronic diastolic (congestive) heart failure: Secondary | ICD-10-CM | POA: Insufficient documentation

## 2023-10-11 DIAGNOSIS — E669 Obesity, unspecified: Secondary | ICD-10-CM | POA: Diagnosis not present

## 2023-10-11 DIAGNOSIS — R0600 Dyspnea, unspecified: Secondary | ICD-10-CM | POA: Diagnosis not present

## 2023-10-11 DIAGNOSIS — G4733 Obstructive sleep apnea (adult) (pediatric): Secondary | ICD-10-CM | POA: Diagnosis not present

## 2023-10-11 DIAGNOSIS — E1122 Type 2 diabetes mellitus with diabetic chronic kidney disease: Secondary | ICD-10-CM | POA: Diagnosis not present

## 2023-10-11 DIAGNOSIS — I130 Hypertensive heart and chronic kidney disease with heart failure and stage 1 through stage 4 chronic kidney disease, or unspecified chronic kidney disease: Secondary | ICD-10-CM | POA: Insufficient documentation

## 2023-10-11 DIAGNOSIS — I495 Sick sinus syndrome: Secondary | ICD-10-CM | POA: Diagnosis not present

## 2023-10-11 DIAGNOSIS — K219 Gastro-esophageal reflux disease without esophagitis: Secondary | ICD-10-CM | POA: Insufficient documentation

## 2023-10-11 DIAGNOSIS — Z794 Long term (current) use of insulin: Secondary | ICD-10-CM

## 2023-10-11 DIAGNOSIS — I350 Nonrheumatic aortic (valve) stenosis: Secondary | ICD-10-CM | POA: Diagnosis not present

## 2023-10-11 DIAGNOSIS — R0609 Other forms of dyspnea: Secondary | ICD-10-CM | POA: Diagnosis not present

## 2023-10-11 DIAGNOSIS — Z85038 Personal history of other malignant neoplasm of large intestine: Secondary | ICD-10-CM | POA: Diagnosis not present

## 2023-10-11 DIAGNOSIS — N184 Chronic kidney disease, stage 4 (severe): Secondary | ICD-10-CM | POA: Diagnosis not present

## 2023-10-11 DIAGNOSIS — I10 Essential (primary) hypertension: Secondary | ICD-10-CM | POA: Diagnosis not present

## 2023-10-11 DIAGNOSIS — N1832 Chronic kidney disease, stage 3b: Secondary | ICD-10-CM

## 2023-10-11 DIAGNOSIS — Z6839 Body mass index (BMI) 39.0-39.9, adult: Secondary | ICD-10-CM | POA: Diagnosis not present

## 2023-10-11 DIAGNOSIS — I5030 Unspecified diastolic (congestive) heart failure: Secondary | ICD-10-CM | POA: Diagnosis not present

## 2023-10-11 DIAGNOSIS — E1142 Type 2 diabetes mellitus with diabetic polyneuropathy: Secondary | ICD-10-CM | POA: Diagnosis not present

## 2023-10-11 DIAGNOSIS — S0101XD Laceration without foreign body of scalp, subsequent encounter: Secondary | ICD-10-CM | POA: Diagnosis not present

## 2023-10-11 NOTE — Progress Notes (Signed)
ReDS Vest / Clip - 10/11/23 1600       ReDS Vest / Clip   Station Marker B    Ruler Value 31.5    ReDS Value Range Low volume    ReDS Actual Value 29

## 2023-10-11 NOTE — Progress Notes (Signed)
[facility] Note   Date:  10/11/2023   ID:  [name], DOB [birth year], MRN [id number]  Location: Home  Provider location: [facility] Type of Visit: Established patient  PCP:  Aliene Beams, MD  Cardiologist:  Armanda Magic, MD Nephrology: Dr. Malen Gauze HF Cardiologist: Dr. Gala Romney  Chief Complaint: Heart Failure follow-up   HPI: [name] is a 83 y.o. female with history of diastolic heart failure, DM, GERD, colon cancer, CKD stage III, 2016 pacemaker (MDT) due to symptomatic sinus node dysfunction.  In 7/19, she had 12 pound weight and she was sent to the ED by her PCP. She was sent home on metolazone every other day for 14 days. She took 4 doses and her creatinine went up to 3 so metolazone was stopped.   R/L cath in 9/19. Normal cors. Elevated filling pressures with pulmonary venous HTN.   Had fall with R tibia fracture in 9/21    Having more dyspnea at follow up 2/22, arranged for RHC and repeat echo.  RHC (2/22) showed mild to moderate elevated filling pressures with prominent v waves in PCWP tracing, mild pulmonary HTN and high CO with no evidence of intracardiac shunting. Echo (4/22) showed EF 60-65%, RV ok, mild AS.  SCr remained elevated on lab check at EP visit 8/22 and irbesartan stopped.  Echo 01/17/22 EF 60-65% G1DD RV normal. RVSP 31  Follow up 9/23, chronically  NYHA IIIb, volume OK on torsemide 40 + metolazone once a week.  Echo 9/24 EF 60-65% RV ok. Diastolic function indeterminate   Follow up 11/24, markedly volume overloaded. Instructed to use Furoscix x 5 days and metolazone. She was able to use 2 doses of Furoscix but stopped due to sciatic pain from getting up frequently to urinate.  Seen in ED 09/11/23 with mechanical fall and scalp lac.  Today she returns for HF follow up with her daughter. Overall feeling fine. No SOB walking on flat ground with RW. Has HH, finished wound care. Leg swelling  improved. Denies palpitations, abnormal bleeding, CP, dizziness, or PND/Orthopnea. Appetite ok. No fever or chills. Weight at home 190 pounds. Taking all medications.   Cardiac Studies: - Echo 9/24 EF 60-65% RV ok. Diastolic function indeterminate   - Echo (5/23): EF 60-65%, grade I DD, RV normal, RVSP 31  - Echo (4/22): EF 60-65%, RV ok, mild AS mean gradient 21.0 mmHg  - RHC (2/22):  RA = 9 RV = 49/13 PA = 49/17 (33) PCW = 22 ( v waves 28) Fick cardiac output/index = 8.0/3.9 PVR = 1.6 WU FA sat = 95% PA sat = 74%, 73% High SVC sat = 69%  Assessment: 1. Mild to moderately elevated filling pressures with prominent v waves in PCWP tracing suggestive of diastolic dysfunction versus mitral regurgitation 2. Mild pulmonary venous HTN 3. High cardiac output with no evidence of intracardiac shunting  - PFTs 05/23/18: FEV1: 1.72 FEV1/FVC ratio: 77% DLCO: 70%  - R/LHC 05/19/18: Ao = 192/74 (118) LV = 201/26 RA = 14 RV = 53/18 PA = 53/19 (38) PCW = 27 (v = 41) Fick cardiac output/index = 5.6/2.8 PVR = 2.0 WU Ao sat = 95% PA sat = 64%, 66%  1. Normal coronaries with ectactic vessels suggestive of longstanding HTN 2. Severe HTN 3. Normal LV function 4. Signficantly elevated filling pressures with pulmonary venous HTN in setting of holding diuretics for 2 days  Plan/Discussion: Filling pressures and BP elevated. Will resume  diuretics. Will need aggressive titration of ant-HTN regimen.  - Echo 05/14/18: EF 50-55%, mild LVH - Aortic valve: Sclerosis without stenosis. - Mitral valve: Moderately calcified annulus. Moderately thickened,   mildly calcified leaflets . - Left atrium: The atrium was mildly dilated. - Atrial septum: No defect or patent foramen ovale was identified.   -Sleep study (10/19): AHI 49  - Myoview (06/21/15): Nuclear stress EF: 64%. There was no ST segment deviation noted during stress. The study is normal. This is a low risk study. No ischemia  identified.  SH: Former 30-40 pack year smoker, quit 25 years ago.   Past Medical History:  Diagnosis Date   Anxiety    Arthritis    Cancer (HCC)    colon   CHF (congestive heart failure) (HCC)    Chronic kidney disease    stage 3   Chronic pain syndrome    Diabetes (HCC)    Diabetic neuropathy (HCC)    Dyslipidemia    Early cataracts, bilateral    Fibromyalgia    GERD (gastroesophageal reflux disease)    H/O syncope    Heart murmur    Hypertension    Lumbar spinal stenosis    and scoliosis   Pneumonia    PONV (postoperative nausea and vomiting)    Presence of permanent cardiac pacemaker    Restless legs    Spinal headache    Thyroid nodule    Past Surgical History:  Procedure Laterality Date   APPENDECTOMY     BREAST SURGERY     lumpectomy   COLON SURGERY     DILATION AND CURETTAGE OF UTERUS     EP IMPLANTABLE DEVICE N/A 07/21/2015   Procedure: Pacemaker Implant;  Surgeon: Duke Salvia, MD;  Location: [facility] INVASIVE CV LAB;  Service: Cardiovascular;  Laterality: N/A;   LUMBAR LAMINECTOMY/DECOMPRESSION MICRODISCECTOMY N/A 02/02/2016   Procedure: DECOMPRESSION L4-L5 WITH INSITE 2 FUSION ;  Surgeon: Venita Lick, MD;  Location: [facility] OR;  Service: Orthopedics;  Laterality: N/A;   PARTIAL NEPHRECTOMY     RIGHT HEART CATH N/A 11/02/2020   Procedure: RIGHT HEART CATH;  Surgeon: Dolores Patty, MD;  Location: [facility] INVASIVE CV LAB;  Service: Cardiovascular;  Laterality: N/A;   RIGHT/LEFT HEART CATH AND CORONARY ANGIOGRAPHY N/A 05/19/2018   Procedure: RIGHT/LEFT HEART CATH AND CORONARY ANGIOGRAPHY;  Surgeon: Dolores Patty, MD;  Location: [facility] INVASIVE CV LAB;  Service: Cardiovascular;  Laterality: N/A;   TIBIA IM NAIL INSERTION Right 05/19/2020   Procedure: INTRAMEDULLARY (IM) NAIL TIBIAL;  Surgeon: Yolonda Kida, MD;  [facility];  Service: Orthopedics;  Laterality: Right;   TONSILLECTOMY     TUBAL LIGATION     Current Outpatient Medications  Medication Sig  Dispense Refill   acetaminophen (TYLENOL) 650 MG CR tablet 2 tablets     B-D UF III MINI PEN NEEDLES 31G X 5 MM MISC      Blood Glucose Monitoring Suppl (ACCU-CHEK AVIVA PLUS) w/Device KIT      Cholecalciferol (VITAMIN D) 2000 UNITS tablet Take 2,000 Units by mouth daily.     Continuous Blood Gluc Sensor (FREESTYLE LIBRE 2 SENSOR) MISC See admin instructions.     diclofenac Sodium (VOLTAREN) 1 % GEL Apply 1 application topically at bedtime as needed (sleep).     diphenhydrAMINE (BENADRYL) 25 MG tablet Take 25 mg by mouth daily as needed for itching or allergies.     DULoxetine (CYMBALTA) 60 MG capsule Take 60 mg by mouth daily.  Furosemide (FUROSCIX) 80 MG/10ML CTKT Inject 80 mg into the skin as directed.     gabapentin (NEURONTIN) 300 MG capsule Take 300 mg by mouth at bedtime.     hydrALAZINE (APRESOLINE) 100 MG tablet Take 1 tablet (100 mg total) by mouth 3 (three) times daily. 90 tablet 6   insulin degludec (TRESIBA FLEXTOUCH) 100 UNIT/ML FlexTouch Pen 45 Units as directed.     labetalol (NORMODYNE) 200 MG tablet Take 1 tablet (200 mg total) by mouth 2 (two) times daily. Absolute last refill without office visit please call [phone] to schedule follow up 180 tablet 2   linaCLOtide (LINZESS PO) Take 72 mcg by mouth daily.     oxyCODONE-acetaminophen (PERCOCET) 10-325 MG tablet Take 1 tablet by mouth every 6 (six) hours as needed for pain.     potassium chloride SA (KLOR-CON M) 10 MEQ tablet Take 2 tablets (20 mEq total) by mouth daily. Take 4 extra tab on [facility] with metolazone 60 tablet 11   pramipexole (MIRAPEX) 0.125 MG tablet Take 0.25 mg by mouth 2 (two) times daily.     rosuvastatin (CRESTOR) 10 MG tablet Take 10 mg by mouth daily.      torsemide (DEMADEX) 20 MG tablet Take 3 tablets (60 mg total) by mouth daily. (Patient taking differently: Patient takes 2 tablets by mouth once a day.) 90 tablet 11   Calcium Carb-Cholecalciferol (CALCIUM 500 + D3) 500-15 MG-MCG TABS Take 2  tablets by mouth daily. (Patient not taking: Reported on 10/11/2023)     No current facility-administered medications for this encounter.   Allergies:   Penicillins, Codeine, Adhesive [tape], Aspirin, Crab (diagnostic), Iodine, Ivp dye [iodinated contrast media], and Sulfa antibiotics   Social History:  The patient  reports that she quit smoking about 40 years ago. Her smoking use included cigarettes. She started smoking about 70 years ago. She has never used smokeless tobacco. She reports current alcohol use. She reports that she does not use drugs.   Family History:  The patient's family history includes CAD in an other family member; Sick sinus syndrome in her brother.   ROS:  Please see the history of present illness.   All other systems are personally reviewed and negative.   Recent Labs: 03/27/2023: Hemoglobin 12.8; Platelets 148 08/06/2023: ALT 20; B Natriuretic Peptide 99.2; BUN 52; Creatinine, Ser 2.02; Potassium 3.9; Sodium 137  Personally reviewed   Wt Readings from Last 3 Encounters:  10/11/23 90.6 kg (199 lb 12.8 oz)  08/06/23 95.3 kg (210 lb 3.2 oz)  08/01/23 96.8 kg (213 lb 6.4 oz)    BP (!) 140/68   Pulse 86   Wt 90.6 kg (199 lb 12.8 oz)   SpO2 94%   BMI 39.02 kg/m   Physical Exam:   General:  NAD. No resp difficulty, walked into clinic with RW, elderly HEENT: Normal Neck: Supple. No JVD. Cor: Regular rate & rhythm. No rubs, gallops or murmurs. Lungs: Clear Abdomen: Soft, nontender, nondistended.  Extremities: No cyanosis, clubbing, rash, 1+ BLE edema with chronic venous stasis Neuro: Alert & oriented x 3, moves all 4 extremities w/o difficulty. Affect pleasant.  PPM interrogation (personally reviewed): 0.2 hr/day activity,  no AF, 97.7% AP-VS  ReDs reading: 29%, normal  Assessment & Plan: 1. Chronic Diastolic Heart Failure  - Echo 05/14/18: EF 50-55%, posterior basal and inferolateral HK, mild LVH, LA mildly dilated - R/LHC 05/19/18: normal coronaries,  elevated filling pressure (had held diuretics x2 days), severe HTN. - RHC (2/22): Mild to  moderate elevated filling pressures, mild pulm venous HTN, high CO with no shunting. - Echo (4/22): EF 60-65%, RV ok, mild AS mean gradient 21.0 mmHg - Echo (5/23): EF 60-65% G1DD RV normal. RVSP 31 - Echo 9/24 EF 60-65% RV ok. Diastolic function indeterminate  - Chronically NYHA IIb-III, functional status confounded by body habitus and general deconditioning. Volume OK today, weight down 14 lbs, ReDs normal at 29%. Has chronic venous stasis to LE, needs compression. - Continue torsemide 40 mg bid + 20 KCL daily - OK to use metolazone 2.5 mg/40 KCL PRN weight gain/swelling. - Discussed fluid restriction - Off Jardiance due to vaginal irriation. - No ARB/ARNi/spiro with CKD. - Continue ace wraps with HH RN - had labs at nephrology 2 weeks ago, will request records  - She has Furoscix at home for PRN use.  2. HTN, severe - BP up a bit today. - Continue hydralazine 100 mg tid. - Continue labetalol 200 mg bid. - Consider addition of amlodipine next if BP remains elevated.  3. Aortic Stenosis - Mild by echo 4/22, mean gradient 21 - Mild by echo 01/17/22, mean gradient 13  - AS mild on echo 9/24   4. OSA  - Severe, AHI 49 by PSG in 10/19 - f/u testing AHI 11 - Had televisit with Dr. Mayford Knife on 09/30/19. She refused CPAP due to claustrophobia.  - Repeat HST showed severe, AHI 31.1.  - Continues to refuse CPAP  5. Dyspnea - LHC 05/19/18 with normal coronaries. - PFTs with DLCO 70%. - RHC (2/22): Mild pulmonary venous hypertension - Suspect she is limited body habitus and general deconditioning.  6. CKD Stage IV - Followed by [facility] (Dr. Malen Gauze) - Baseline SCr 2.0-2.4, most recent SCr 2.02 - No longer on SGLT2i - Had labs at [facility] 09/25/23, will request records.   7. Sinus node dysfunction s/p MDT PPM - Follows with Dr Graciela Husbands. - interrogation as above   8. DM - She is on insulin - Per PCP - no  change.  9. Obesity - Needs weight loss.  - Body mass index is 39.02 kg/m.  - Failed Ozempic due to constipation, she declined tirzepatide  Follow up in 4 months with Dr. Mickle Plumb, FNP  10/11/2023 3:25 PM  [facility] [address] [facility] [place] [address] [phone] (office) [phone] (fax)

## 2023-10-11 NOTE — Progress Notes (Signed)
Called [facility] spoke with staff to get patient most recent labs from 09/25/2023 by Dr. Malen Gauze faxed over to our office. Provided out office fax number and was told it was being  faxed to our office at that [phone].

## 2023-10-11 NOTE — Patient Instructions (Addendum)
 Thank you for coming in today  If you had labs drawn today, any labs that are abnormal the clinic will call you No news is good news  Medications: No changes  Follow up appointments:  Your physician recommends that you schedule a follow-up appointment in:  4 months With Dr. Gala Romney  You will receive a reminder letter in the mail a few months in advance. If you don't receive a letter, please call our office to schedule the follow-up appointment.    Do the following things EVERYDAY: Weigh yourself in the morning before breakfast. Write it down and keep it in a log. Take your medicines as prescribed Eat low salt foods--Limit salt (sodium) to 2000 mg per day.  Stay as active as you can everyday Limit all fluids for the day to less than 2 liters   At the [facility], you and your health needs are our priority. As part of our continuing mission to provide you with exceptional heart care, we have created designated Provider Care Teams. These Care Teams include your primary Cardiologist (physician) and Advanced Practice Providers (APPs- Physician Assistants and Nurse Practitioners) who all work together to provide you with the care you need, when you need it.   You may see any of the following providers on your designated Care Team at your next follow up: Dr Arvilla Meres Dr Marca Ancona Dr. Marcos Eke, NP Robbie Lis, [place] [facility] [place], [place] Brynda Peon, NP Karle Plumber, PharmD   Please be sure to bring in all your medications bottles to every appointment.    Thank you for choosing [facility]  If you have any questions or concerns before your next appointment please send [place] a message through [place] or call our office at [phone].    TO LEAVE A MESSAGE FOR THE NURSE SELECT OPTION 2, PLEASE LEAVE A MESSAGE INCLUDING: YOUR NAME DATE OF BIRTH CALL BACK NUMBER REASON FOR  CALL**this is important as we prioritize the call backs  YOU WILL RECEIVE A CALL BACK THE SAME DAY AS LONG AS YOU CALL BEFORE 4:00 PM

## 2023-10-15 DIAGNOSIS — I5030 Unspecified diastolic (congestive) heart failure: Secondary | ICD-10-CM | POA: Diagnosis not present

## 2023-10-15 DIAGNOSIS — E1142 Type 2 diabetes mellitus with diabetic polyneuropathy: Secondary | ICD-10-CM | POA: Diagnosis not present

## 2023-10-15 DIAGNOSIS — E1122 Type 2 diabetes mellitus with diabetic chronic kidney disease: Secondary | ICD-10-CM | POA: Diagnosis not present

## 2023-10-15 DIAGNOSIS — S0101XD Laceration without foreign body of scalp, subsequent encounter: Secondary | ICD-10-CM | POA: Diagnosis not present

## 2023-10-15 DIAGNOSIS — I130 Hypertensive heart and chronic kidney disease with heart failure and stage 1 through stage 4 chronic kidney disease, or unspecified chronic kidney disease: Secondary | ICD-10-CM | POA: Diagnosis not present

## 2023-10-15 DIAGNOSIS — N184 Chronic kidney disease, stage 4 (severe): Secondary | ICD-10-CM | POA: Diagnosis not present

## 2023-10-16 DIAGNOSIS — I5030 Unspecified diastolic (congestive) heart failure: Secondary | ICD-10-CM | POA: Diagnosis not present

## 2023-10-16 DIAGNOSIS — I130 Hypertensive heart and chronic kidney disease with heart failure and stage 1 through stage 4 chronic kidney disease, or unspecified chronic kidney disease: Secondary | ICD-10-CM | POA: Diagnosis not present

## 2023-10-16 DIAGNOSIS — S0101XD Laceration without foreign body of scalp, subsequent encounter: Secondary | ICD-10-CM | POA: Diagnosis not present

## 2023-10-16 DIAGNOSIS — E1142 Type 2 diabetes mellitus with diabetic polyneuropathy: Secondary | ICD-10-CM | POA: Diagnosis not present

## 2023-10-16 DIAGNOSIS — E1122 Type 2 diabetes mellitus with diabetic chronic kidney disease: Secondary | ICD-10-CM | POA: Diagnosis not present

## 2023-10-16 DIAGNOSIS — N184 Chronic kidney disease, stage 4 (severe): Secondary | ICD-10-CM | POA: Diagnosis not present

## 2023-10-18 DIAGNOSIS — S0101XD Laceration without foreign body of scalp, subsequent encounter: Secondary | ICD-10-CM | POA: Diagnosis not present

## 2023-10-18 DIAGNOSIS — N184 Chronic kidney disease, stage 4 (severe): Secondary | ICD-10-CM | POA: Diagnosis not present

## 2023-10-18 DIAGNOSIS — I5030 Unspecified diastolic (congestive) heart failure: Secondary | ICD-10-CM | POA: Diagnosis not present

## 2023-10-18 DIAGNOSIS — I130 Hypertensive heart and chronic kidney disease with heart failure and stage 1 through stage 4 chronic kidney disease, or unspecified chronic kidney disease: Secondary | ICD-10-CM | POA: Diagnosis not present

## 2023-10-18 DIAGNOSIS — E1122 Type 2 diabetes mellitus with diabetic chronic kidney disease: Secondary | ICD-10-CM | POA: Diagnosis not present

## 2023-10-18 DIAGNOSIS — E1142 Type 2 diabetes mellitus with diabetic polyneuropathy: Secondary | ICD-10-CM | POA: Diagnosis not present

## 2023-10-22 DIAGNOSIS — N184 Chronic kidney disease, stage 4 (severe): Secondary | ICD-10-CM | POA: Diagnosis not present

## 2023-10-22 DIAGNOSIS — E1122 Type 2 diabetes mellitus with diabetic chronic kidney disease: Secondary | ICD-10-CM | POA: Diagnosis not present

## 2023-10-22 DIAGNOSIS — E1142 Type 2 diabetes mellitus with diabetic polyneuropathy: Secondary | ICD-10-CM | POA: Diagnosis not present

## 2023-10-22 DIAGNOSIS — I5030 Unspecified diastolic (congestive) heart failure: Secondary | ICD-10-CM | POA: Diagnosis not present

## 2023-10-22 DIAGNOSIS — S0101XD Laceration without foreign body of scalp, subsequent encounter: Secondary | ICD-10-CM | POA: Diagnosis not present

## 2023-10-22 DIAGNOSIS — I130 Hypertensive heart and chronic kidney disease with heart failure and stage 1 through stage 4 chronic kidney disease, or unspecified chronic kidney disease: Secondary | ICD-10-CM | POA: Diagnosis not present

## 2023-10-23 DIAGNOSIS — N184 Chronic kidney disease, stage 4 (severe): Secondary | ICD-10-CM | POA: Diagnosis not present

## 2023-10-23 DIAGNOSIS — S0101XD Laceration without foreign body of scalp, subsequent encounter: Secondary | ICD-10-CM | POA: Diagnosis not present

## 2023-10-23 DIAGNOSIS — E1122 Type 2 diabetes mellitus with diabetic chronic kidney disease: Secondary | ICD-10-CM | POA: Diagnosis not present

## 2023-10-23 DIAGNOSIS — I130 Hypertensive heart and chronic kidney disease with heart failure and stage 1 through stage 4 chronic kidney disease, or unspecified chronic kidney disease: Secondary | ICD-10-CM | POA: Diagnosis not present

## 2023-10-23 DIAGNOSIS — I5030 Unspecified diastolic (congestive) heart failure: Secondary | ICD-10-CM | POA: Diagnosis not present

## 2023-10-23 DIAGNOSIS — E1142 Type 2 diabetes mellitus with diabetic polyneuropathy: Secondary | ICD-10-CM | POA: Diagnosis not present

## 2023-10-25 ENCOUNTER — Other Ambulatory Visit: Payer: Medicare Other

## 2023-10-29 DIAGNOSIS — N184 Chronic kidney disease, stage 4 (severe): Secondary | ICD-10-CM | POA: Diagnosis not present

## 2023-10-29 DIAGNOSIS — I130 Hypertensive heart and chronic kidney disease with heart failure and stage 1 through stage 4 chronic kidney disease, or unspecified chronic kidney disease: Secondary | ICD-10-CM | POA: Diagnosis not present

## 2023-10-29 DIAGNOSIS — S0101XD Laceration without foreign body of scalp, subsequent encounter: Secondary | ICD-10-CM | POA: Diagnosis not present

## 2023-10-29 DIAGNOSIS — E1122 Type 2 diabetes mellitus with diabetic chronic kidney disease: Secondary | ICD-10-CM | POA: Diagnosis not present

## 2023-10-29 DIAGNOSIS — E1142 Type 2 diabetes mellitus with diabetic polyneuropathy: Secondary | ICD-10-CM | POA: Diagnosis not present

## 2023-10-29 DIAGNOSIS — I5030 Unspecified diastolic (congestive) heart failure: Secondary | ICD-10-CM | POA: Diagnosis not present

## 2023-11-05 ENCOUNTER — Ambulatory Visit (INDEPENDENT_AMBULATORY_CARE_PROVIDER_SITE_OTHER): Payer: Medicare Other

## 2023-11-05 DIAGNOSIS — I495 Sick sinus syndrome: Secondary | ICD-10-CM | POA: Diagnosis not present

## 2023-11-05 DIAGNOSIS — E1122 Type 2 diabetes mellitus with diabetic chronic kidney disease: Secondary | ICD-10-CM | POA: Diagnosis not present

## 2023-11-05 DIAGNOSIS — I5030 Unspecified diastolic (congestive) heart failure: Secondary | ICD-10-CM | POA: Diagnosis not present

## 2023-11-05 DIAGNOSIS — E1142 Type 2 diabetes mellitus with diabetic polyneuropathy: Secondary | ICD-10-CM | POA: Diagnosis not present

## 2023-11-05 DIAGNOSIS — I130 Hypertensive heart and chronic kidney disease with heart failure and stage 1 through stage 4 chronic kidney disease, or unspecified chronic kidney disease: Secondary | ICD-10-CM | POA: Diagnosis not present

## 2023-11-05 DIAGNOSIS — S0101XD Laceration without foreign body of scalp, subsequent encounter: Secondary | ICD-10-CM | POA: Diagnosis not present

## 2023-11-05 DIAGNOSIS — N184 Chronic kidney disease, stage 4 (severe): Secondary | ICD-10-CM | POA: Diagnosis not present

## 2023-11-08 DIAGNOSIS — E1142 Type 2 diabetes mellitus with diabetic polyneuropathy: Secondary | ICD-10-CM | POA: Diagnosis not present

## 2023-11-08 DIAGNOSIS — N184 Chronic kidney disease, stage 4 (severe): Secondary | ICD-10-CM | POA: Diagnosis not present

## 2023-11-08 DIAGNOSIS — E1122 Type 2 diabetes mellitus with diabetic chronic kidney disease: Secondary | ICD-10-CM | POA: Diagnosis not present

## 2023-11-08 DIAGNOSIS — I130 Hypertensive heart and chronic kidney disease with heart failure and stage 1 through stage 4 chronic kidney disease, or unspecified chronic kidney disease: Secondary | ICD-10-CM | POA: Diagnosis not present

## 2023-11-08 DIAGNOSIS — S0101XD Laceration without foreign body of scalp, subsequent encounter: Secondary | ICD-10-CM | POA: Diagnosis not present

## 2023-11-08 DIAGNOSIS — I5030 Unspecified diastolic (congestive) heart failure: Secondary | ICD-10-CM | POA: Diagnosis not present

## 2023-11-08 LAB — CUP PACEART REMOTE DEVICE CHECK
Battery Remaining Longevity: 28 mo
Battery Voltage: 2.94 V
Brady Statistic AP VP Percent: 0.24 %
Brady Statistic AP VS Percent: 98.08 %
Brady Statistic AS VP Percent: 0 %
Brady Statistic AS VS Percent: 1.67 %
Brady Statistic RA Percent Paced: 97.34 %
Brady Statistic RV Percent Paced: 0.26 %
Date Time Interrogation Session: 20250227144703
Implantable Lead Connection Status: 753985
Implantable Lead Connection Status: 753985
Implantable Lead Implant Date: 20161110
Implantable Lead Implant Date: 20161110
Implantable Lead Location: 753859
Implantable Lead Location: 753860
Implantable Lead Model: 5076
Implantable Lead Model: 5076
Implantable Pulse Generator Implant Date: 20161110
Lead Channel Impedance Value: 342 Ohm
Lead Channel Impedance Value: 342 Ohm
Lead Channel Impedance Value: 380 Ohm
Lead Channel Impedance Value: 380 Ohm
Lead Channel Pacing Threshold Amplitude: 0.625 V
Lead Channel Pacing Threshold Amplitude: 1.625 V
Lead Channel Pacing Threshold Pulse Width: 0.4 ms
Lead Channel Pacing Threshold Pulse Width: 0.4 ms
Lead Channel Sensing Intrinsic Amplitude: 3.125 mV
Lead Channel Sensing Intrinsic Amplitude: 3.125 mV
Lead Channel Sensing Intrinsic Amplitude: 4 mV
Lead Channel Sensing Intrinsic Amplitude: 4 mV
Lead Channel Setting Pacing Amplitude: 1.5 V
Lead Channel Setting Pacing Amplitude: 4.25 V
Lead Channel Setting Pacing Pulse Width: 0.4 ms
Lead Channel Setting Sensing Sensitivity: 2.8 mV
Zone Setting Status: 755011
Zone Setting Status: 755011

## 2023-11-12 DIAGNOSIS — N184 Chronic kidney disease, stage 4 (severe): Secondary | ICD-10-CM | POA: Diagnosis not present

## 2023-11-12 DIAGNOSIS — E1142 Type 2 diabetes mellitus with diabetic polyneuropathy: Secondary | ICD-10-CM | POA: Diagnosis not present

## 2023-11-12 DIAGNOSIS — I5030 Unspecified diastolic (congestive) heart failure: Secondary | ICD-10-CM | POA: Diagnosis not present

## 2023-11-12 DIAGNOSIS — E1122 Type 2 diabetes mellitus with diabetic chronic kidney disease: Secondary | ICD-10-CM | POA: Diagnosis not present

## 2023-11-12 DIAGNOSIS — I130 Hypertensive heart and chronic kidney disease with heart failure and stage 1 through stage 4 chronic kidney disease, or unspecified chronic kidney disease: Secondary | ICD-10-CM | POA: Diagnosis not present

## 2023-11-12 DIAGNOSIS — E1159 Type 2 diabetes mellitus with other circulatory complications: Secondary | ICD-10-CM | POA: Diagnosis not present

## 2023-11-14 DIAGNOSIS — N184 Chronic kidney disease, stage 4 (severe): Secondary | ICD-10-CM | POA: Diagnosis not present

## 2023-11-14 DIAGNOSIS — E1122 Type 2 diabetes mellitus with diabetic chronic kidney disease: Secondary | ICD-10-CM | POA: Diagnosis not present

## 2023-11-14 DIAGNOSIS — E1159 Type 2 diabetes mellitus with other circulatory complications: Secondary | ICD-10-CM | POA: Diagnosis not present

## 2023-11-14 DIAGNOSIS — I5030 Unspecified diastolic (congestive) heart failure: Secondary | ICD-10-CM | POA: Diagnosis not present

## 2023-11-14 DIAGNOSIS — E1142 Type 2 diabetes mellitus with diabetic polyneuropathy: Secondary | ICD-10-CM | POA: Diagnosis not present

## 2023-11-14 DIAGNOSIS — I130 Hypertensive heart and chronic kidney disease with heart failure and stage 1 through stage 4 chronic kidney disease, or unspecified chronic kidney disease: Secondary | ICD-10-CM | POA: Diagnosis not present

## 2023-11-15 DIAGNOSIS — I130 Hypertensive heart and chronic kidney disease with heart failure and stage 1 through stage 4 chronic kidney disease, or unspecified chronic kidney disease: Secondary | ICD-10-CM | POA: Diagnosis not present

## 2023-11-15 DIAGNOSIS — E1142 Type 2 diabetes mellitus with diabetic polyneuropathy: Secondary | ICD-10-CM | POA: Diagnosis not present

## 2023-11-15 DIAGNOSIS — E1159 Type 2 diabetes mellitus with other circulatory complications: Secondary | ICD-10-CM | POA: Diagnosis not present

## 2023-11-15 DIAGNOSIS — N184 Chronic kidney disease, stage 4 (severe): Secondary | ICD-10-CM | POA: Diagnosis not present

## 2023-11-15 DIAGNOSIS — I5030 Unspecified diastolic (congestive) heart failure: Secondary | ICD-10-CM | POA: Diagnosis not present

## 2023-11-15 DIAGNOSIS — E1122 Type 2 diabetes mellitus with diabetic chronic kidney disease: Secondary | ICD-10-CM | POA: Diagnosis not present

## 2023-11-18 DIAGNOSIS — R001 Bradycardia, unspecified: Secondary | ICD-10-CM | POA: Diagnosis not present

## 2023-11-18 DIAGNOSIS — N184 Chronic kidney disease, stage 4 (severe): Secondary | ICD-10-CM | POA: Diagnosis not present

## 2023-11-18 DIAGNOSIS — E1159 Type 2 diabetes mellitus with other circulatory complications: Secondary | ICD-10-CM | POA: Diagnosis not present

## 2023-11-18 DIAGNOSIS — I5030 Unspecified diastolic (congestive) heart failure: Secondary | ICD-10-CM | POA: Diagnosis not present

## 2023-11-18 DIAGNOSIS — Z87891 Personal history of nicotine dependence: Secondary | ICD-10-CM | POA: Diagnosis not present

## 2023-11-18 DIAGNOSIS — I130 Hypertensive heart and chronic kidney disease with heart failure and stage 1 through stage 4 chronic kidney disease, or unspecified chronic kidney disease: Secondary | ICD-10-CM | POA: Diagnosis not present

## 2023-11-18 DIAGNOSIS — I890 Lymphedema, not elsewhere classified: Secondary | ICD-10-CM | POA: Diagnosis not present

## 2023-11-18 DIAGNOSIS — I499 Cardiac arrhythmia, unspecified: Secondary | ICD-10-CM | POA: Diagnosis not present

## 2023-11-18 DIAGNOSIS — I495 Sick sinus syndrome: Secondary | ICD-10-CM | POA: Diagnosis not present

## 2023-11-18 DIAGNOSIS — E1142 Type 2 diabetes mellitus with diabetic polyneuropathy: Secondary | ICD-10-CM | POA: Diagnosis not present

## 2023-11-18 DIAGNOSIS — E1122 Type 2 diabetes mellitus with diabetic chronic kidney disease: Secondary | ICD-10-CM | POA: Diagnosis not present

## 2023-11-21 DIAGNOSIS — E1142 Type 2 diabetes mellitus with diabetic polyneuropathy: Secondary | ICD-10-CM | POA: Diagnosis not present

## 2023-11-21 DIAGNOSIS — I5030 Unspecified diastolic (congestive) heart failure: Secondary | ICD-10-CM | POA: Diagnosis not present

## 2023-11-21 DIAGNOSIS — E1159 Type 2 diabetes mellitus with other circulatory complications: Secondary | ICD-10-CM | POA: Diagnosis not present

## 2023-11-21 DIAGNOSIS — E1122 Type 2 diabetes mellitus with diabetic chronic kidney disease: Secondary | ICD-10-CM | POA: Diagnosis not present

## 2023-11-21 DIAGNOSIS — I130 Hypertensive heart and chronic kidney disease with heart failure and stage 1 through stage 4 chronic kidney disease, or unspecified chronic kidney disease: Secondary | ICD-10-CM | POA: Diagnosis not present

## 2023-11-21 DIAGNOSIS — N184 Chronic kidney disease, stage 4 (severe): Secondary | ICD-10-CM | POA: Diagnosis not present

## 2023-11-22 DIAGNOSIS — I130 Hypertensive heart and chronic kidney disease with heart failure and stage 1 through stage 4 chronic kidney disease, or unspecified chronic kidney disease: Secondary | ICD-10-CM | POA: Diagnosis not present

## 2023-11-22 DIAGNOSIS — E1142 Type 2 diabetes mellitus with diabetic polyneuropathy: Secondary | ICD-10-CM | POA: Diagnosis not present

## 2023-11-22 DIAGNOSIS — I5030 Unspecified diastolic (congestive) heart failure: Secondary | ICD-10-CM | POA: Diagnosis not present

## 2023-11-22 DIAGNOSIS — N184 Chronic kidney disease, stage 4 (severe): Secondary | ICD-10-CM | POA: Diagnosis not present

## 2023-11-22 DIAGNOSIS — E1159 Type 2 diabetes mellitus with other circulatory complications: Secondary | ICD-10-CM | POA: Diagnosis not present

## 2023-11-22 DIAGNOSIS — E1122 Type 2 diabetes mellitus with diabetic chronic kidney disease: Secondary | ICD-10-CM | POA: Diagnosis not present

## 2023-11-27 DIAGNOSIS — I130 Hypertensive heart and chronic kidney disease with heart failure and stage 1 through stage 4 chronic kidney disease, or unspecified chronic kidney disease: Secondary | ICD-10-CM | POA: Diagnosis not present

## 2023-11-27 DIAGNOSIS — N184 Chronic kidney disease, stage 4 (severe): Secondary | ICD-10-CM | POA: Diagnosis not present

## 2023-11-27 DIAGNOSIS — E1122 Type 2 diabetes mellitus with diabetic chronic kidney disease: Secondary | ICD-10-CM | POA: Diagnosis not present

## 2023-11-27 DIAGNOSIS — E1142 Type 2 diabetes mellitus with diabetic polyneuropathy: Secondary | ICD-10-CM | POA: Diagnosis not present

## 2023-11-27 DIAGNOSIS — I5030 Unspecified diastolic (congestive) heart failure: Secondary | ICD-10-CM | POA: Diagnosis not present

## 2023-11-27 DIAGNOSIS — E1159 Type 2 diabetes mellitus with other circulatory complications: Secondary | ICD-10-CM | POA: Diagnosis not present

## 2023-11-29 DIAGNOSIS — I130 Hypertensive heart and chronic kidney disease with heart failure and stage 1 through stage 4 chronic kidney disease, or unspecified chronic kidney disease: Secondary | ICD-10-CM | POA: Diagnosis not present

## 2023-11-29 DIAGNOSIS — E1159 Type 2 diabetes mellitus with other circulatory complications: Secondary | ICD-10-CM | POA: Diagnosis not present

## 2023-11-29 DIAGNOSIS — E1142 Type 2 diabetes mellitus with diabetic polyneuropathy: Secondary | ICD-10-CM | POA: Diagnosis not present

## 2023-11-29 DIAGNOSIS — I5030 Unspecified diastolic (congestive) heart failure: Secondary | ICD-10-CM | POA: Diagnosis not present

## 2023-11-29 DIAGNOSIS — E1122 Type 2 diabetes mellitus with diabetic chronic kidney disease: Secondary | ICD-10-CM | POA: Diagnosis not present

## 2023-11-29 DIAGNOSIS — N184 Chronic kidney disease, stage 4 (severe): Secondary | ICD-10-CM | POA: Diagnosis not present

## 2023-12-03 DIAGNOSIS — E1142 Type 2 diabetes mellitus with diabetic polyneuropathy: Secondary | ICD-10-CM | POA: Diagnosis not present

## 2023-12-03 DIAGNOSIS — N184 Chronic kidney disease, stage 4 (severe): Secondary | ICD-10-CM | POA: Diagnosis not present

## 2023-12-03 DIAGNOSIS — E1122 Type 2 diabetes mellitus with diabetic chronic kidney disease: Secondary | ICD-10-CM | POA: Diagnosis not present

## 2023-12-03 DIAGNOSIS — E1159 Type 2 diabetes mellitus with other circulatory complications: Secondary | ICD-10-CM | POA: Diagnosis not present

## 2023-12-03 DIAGNOSIS — I130 Hypertensive heart and chronic kidney disease with heart failure and stage 1 through stage 4 chronic kidney disease, or unspecified chronic kidney disease: Secondary | ICD-10-CM | POA: Diagnosis not present

## 2023-12-03 DIAGNOSIS — I5030 Unspecified diastolic (congestive) heart failure: Secondary | ICD-10-CM | POA: Diagnosis not present

## 2023-12-05 DIAGNOSIS — I130 Hypertensive heart and chronic kidney disease with heart failure and stage 1 through stage 4 chronic kidney disease, or unspecified chronic kidney disease: Secondary | ICD-10-CM | POA: Diagnosis not present

## 2023-12-05 DIAGNOSIS — N184 Chronic kidney disease, stage 4 (severe): Secondary | ICD-10-CM | POA: Diagnosis not present

## 2023-12-05 DIAGNOSIS — E1159 Type 2 diabetes mellitus with other circulatory complications: Secondary | ICD-10-CM | POA: Diagnosis not present

## 2023-12-05 DIAGNOSIS — I5030 Unspecified diastolic (congestive) heart failure: Secondary | ICD-10-CM | POA: Diagnosis not present

## 2023-12-05 DIAGNOSIS — E1142 Type 2 diabetes mellitus with diabetic polyneuropathy: Secondary | ICD-10-CM | POA: Diagnosis not present

## 2023-12-05 DIAGNOSIS — E1122 Type 2 diabetes mellitus with diabetic chronic kidney disease: Secondary | ICD-10-CM | POA: Diagnosis not present

## 2023-12-06 DIAGNOSIS — E1142 Type 2 diabetes mellitus with diabetic polyneuropathy: Secondary | ICD-10-CM | POA: Diagnosis not present

## 2023-12-06 DIAGNOSIS — E1122 Type 2 diabetes mellitus with diabetic chronic kidney disease: Secondary | ICD-10-CM | POA: Diagnosis not present

## 2023-12-06 DIAGNOSIS — I5030 Unspecified diastolic (congestive) heart failure: Secondary | ICD-10-CM | POA: Diagnosis not present

## 2023-12-06 DIAGNOSIS — N184 Chronic kidney disease, stage 4 (severe): Secondary | ICD-10-CM | POA: Diagnosis not present

## 2023-12-06 DIAGNOSIS — I130 Hypertensive heart and chronic kidney disease with heart failure and stage 1 through stage 4 chronic kidney disease, or unspecified chronic kidney disease: Secondary | ICD-10-CM | POA: Diagnosis not present

## 2023-12-06 DIAGNOSIS — E1159 Type 2 diabetes mellitus with other circulatory complications: Secondary | ICD-10-CM | POA: Diagnosis not present

## 2023-12-07 DIAGNOSIS — E1159 Type 2 diabetes mellitus with other circulatory complications: Secondary | ICD-10-CM | POA: Diagnosis not present

## 2023-12-07 DIAGNOSIS — N184 Chronic kidney disease, stage 4 (severe): Secondary | ICD-10-CM | POA: Diagnosis not present

## 2023-12-07 DIAGNOSIS — E1122 Type 2 diabetes mellitus with diabetic chronic kidney disease: Secondary | ICD-10-CM | POA: Diagnosis not present

## 2023-12-07 DIAGNOSIS — I5030 Unspecified diastolic (congestive) heart failure: Secondary | ICD-10-CM | POA: Diagnosis not present

## 2023-12-07 DIAGNOSIS — I130 Hypertensive heart and chronic kidney disease with heart failure and stage 1 through stage 4 chronic kidney disease, or unspecified chronic kidney disease: Secondary | ICD-10-CM | POA: Diagnosis not present

## 2023-12-07 DIAGNOSIS — E1142 Type 2 diabetes mellitus with diabetic polyneuropathy: Secondary | ICD-10-CM | POA: Diagnosis not present

## 2023-12-10 DIAGNOSIS — E1159 Type 2 diabetes mellitus with other circulatory complications: Secondary | ICD-10-CM | POA: Diagnosis not present

## 2023-12-10 DIAGNOSIS — E1142 Type 2 diabetes mellitus with diabetic polyneuropathy: Secondary | ICD-10-CM | POA: Diagnosis not present

## 2023-12-10 DIAGNOSIS — I5030 Unspecified diastolic (congestive) heart failure: Secondary | ICD-10-CM | POA: Diagnosis not present

## 2023-12-10 DIAGNOSIS — I130 Hypertensive heart and chronic kidney disease with heart failure and stage 1 through stage 4 chronic kidney disease, or unspecified chronic kidney disease: Secondary | ICD-10-CM | POA: Diagnosis not present

## 2023-12-10 DIAGNOSIS — N184 Chronic kidney disease, stage 4 (severe): Secondary | ICD-10-CM | POA: Diagnosis not present

## 2023-12-10 DIAGNOSIS — E1122 Type 2 diabetes mellitus with diabetic chronic kidney disease: Secondary | ICD-10-CM | POA: Diagnosis not present

## 2023-12-12 DIAGNOSIS — E1159 Type 2 diabetes mellitus with other circulatory complications: Secondary | ICD-10-CM | POA: Diagnosis not present

## 2023-12-12 DIAGNOSIS — I130 Hypertensive heart and chronic kidney disease with heart failure and stage 1 through stage 4 chronic kidney disease, or unspecified chronic kidney disease: Secondary | ICD-10-CM | POA: Diagnosis not present

## 2023-12-12 DIAGNOSIS — N184 Chronic kidney disease, stage 4 (severe): Secondary | ICD-10-CM | POA: Diagnosis not present

## 2023-12-12 DIAGNOSIS — E1142 Type 2 diabetes mellitus with diabetic polyneuropathy: Secondary | ICD-10-CM | POA: Diagnosis not present

## 2023-12-12 DIAGNOSIS — E1122 Type 2 diabetes mellitus with diabetic chronic kidney disease: Secondary | ICD-10-CM | POA: Diagnosis not present

## 2023-12-12 DIAGNOSIS — I5030 Unspecified diastolic (congestive) heart failure: Secondary | ICD-10-CM | POA: Diagnosis not present

## 2023-12-12 NOTE — Addendum Note (Signed)
 Addended by: Elease Etienne A on: 12/12/2023 11:22 AM   Modules accepted: Orders

## 2023-12-12 NOTE — Progress Notes (Signed)
 Remote pacemaker transmission.

## 2023-12-16 DIAGNOSIS — E1122 Type 2 diabetes mellitus with diabetic chronic kidney disease: Secondary | ICD-10-CM | POA: Diagnosis not present

## 2023-12-16 DIAGNOSIS — N184 Chronic kidney disease, stage 4 (severe): Secondary | ICD-10-CM | POA: Diagnosis not present

## 2023-12-16 DIAGNOSIS — E1142 Type 2 diabetes mellitus with diabetic polyneuropathy: Secondary | ICD-10-CM | POA: Diagnosis not present

## 2023-12-16 DIAGNOSIS — I130 Hypertensive heart and chronic kidney disease with heart failure and stage 1 through stage 4 chronic kidney disease, or unspecified chronic kidney disease: Secondary | ICD-10-CM | POA: Diagnosis not present

## 2023-12-16 DIAGNOSIS — E1159 Type 2 diabetes mellitus with other circulatory complications: Secondary | ICD-10-CM | POA: Diagnosis not present

## 2023-12-16 DIAGNOSIS — I5030 Unspecified diastolic (congestive) heart failure: Secondary | ICD-10-CM | POA: Diagnosis not present

## 2023-12-19 DIAGNOSIS — E1142 Type 2 diabetes mellitus with diabetic polyneuropathy: Secondary | ICD-10-CM | POA: Diagnosis not present

## 2023-12-19 DIAGNOSIS — I5030 Unspecified diastolic (congestive) heart failure: Secondary | ICD-10-CM | POA: Diagnosis not present

## 2023-12-19 DIAGNOSIS — N184 Chronic kidney disease, stage 4 (severe): Secondary | ICD-10-CM | POA: Diagnosis not present

## 2023-12-19 DIAGNOSIS — E1159 Type 2 diabetes mellitus with other circulatory complications: Secondary | ICD-10-CM | POA: Diagnosis not present

## 2023-12-19 DIAGNOSIS — E1122 Type 2 diabetes mellitus with diabetic chronic kidney disease: Secondary | ICD-10-CM | POA: Diagnosis not present

## 2023-12-19 DIAGNOSIS — I130 Hypertensive heart and chronic kidney disease with heart failure and stage 1 through stage 4 chronic kidney disease, or unspecified chronic kidney disease: Secondary | ICD-10-CM | POA: Diagnosis not present

## 2023-12-24 DIAGNOSIS — E1159 Type 2 diabetes mellitus with other circulatory complications: Secondary | ICD-10-CM | POA: Diagnosis not present

## 2023-12-24 DIAGNOSIS — E1122 Type 2 diabetes mellitus with diabetic chronic kidney disease: Secondary | ICD-10-CM | POA: Diagnosis not present

## 2023-12-24 DIAGNOSIS — N184 Chronic kidney disease, stage 4 (severe): Secondary | ICD-10-CM | POA: Diagnosis not present

## 2023-12-24 DIAGNOSIS — I130 Hypertensive heart and chronic kidney disease with heart failure and stage 1 through stage 4 chronic kidney disease, or unspecified chronic kidney disease: Secondary | ICD-10-CM | POA: Diagnosis not present

## 2023-12-24 DIAGNOSIS — E1142 Type 2 diabetes mellitus with diabetic polyneuropathy: Secondary | ICD-10-CM | POA: Diagnosis not present

## 2023-12-24 DIAGNOSIS — I5030 Unspecified diastolic (congestive) heart failure: Secondary | ICD-10-CM | POA: Diagnosis not present

## 2023-12-31 DIAGNOSIS — M5416 Radiculopathy, lumbar region: Secondary | ICD-10-CM | POA: Diagnosis not present

## 2023-12-31 DIAGNOSIS — M549 Dorsalgia, unspecified: Secondary | ICD-10-CM | POA: Diagnosis not present

## 2023-12-31 DIAGNOSIS — M961 Postlaminectomy syndrome, not elsewhere classified: Secondary | ICD-10-CM | POA: Diagnosis not present

## 2023-12-31 DIAGNOSIS — M79621 Pain in right upper arm: Secondary | ICD-10-CM | POA: Diagnosis not present

## 2023-12-31 DIAGNOSIS — M25522 Pain in left elbow: Secondary | ICD-10-CM | POA: Diagnosis not present

## 2023-12-31 DIAGNOSIS — K5900 Constipation, unspecified: Secondary | ICD-10-CM | POA: Diagnosis not present

## 2024-01-01 DIAGNOSIS — N2581 Secondary hyperparathyroidism of renal origin: Secondary | ICD-10-CM | POA: Diagnosis not present

## 2024-01-01 DIAGNOSIS — I130 Hypertensive heart and chronic kidney disease with heart failure and stage 1 through stage 4 chronic kidney disease, or unspecified chronic kidney disease: Secondary | ICD-10-CM | POA: Diagnosis not present

## 2024-01-01 DIAGNOSIS — E1122 Type 2 diabetes mellitus with diabetic chronic kidney disease: Secondary | ICD-10-CM | POA: Diagnosis not present

## 2024-01-01 DIAGNOSIS — N184 Chronic kidney disease, stage 4 (severe): Secondary | ICD-10-CM | POA: Diagnosis not present

## 2024-01-01 DIAGNOSIS — E876 Hypokalemia: Secondary | ICD-10-CM | POA: Diagnosis not present

## 2024-01-02 DIAGNOSIS — E1159 Type 2 diabetes mellitus with other circulatory complications: Secondary | ICD-10-CM | POA: Diagnosis not present

## 2024-01-02 DIAGNOSIS — I5030 Unspecified diastolic (congestive) heart failure: Secondary | ICD-10-CM | POA: Diagnosis not present

## 2024-01-02 DIAGNOSIS — E1142 Type 2 diabetes mellitus with diabetic polyneuropathy: Secondary | ICD-10-CM | POA: Diagnosis not present

## 2024-01-02 DIAGNOSIS — E1122 Type 2 diabetes mellitus with diabetic chronic kidney disease: Secondary | ICD-10-CM | POA: Diagnosis not present

## 2024-01-02 DIAGNOSIS — I130 Hypertensive heart and chronic kidney disease with heart failure and stage 1 through stage 4 chronic kidney disease, or unspecified chronic kidney disease: Secondary | ICD-10-CM | POA: Diagnosis not present

## 2024-01-02 DIAGNOSIS — N184 Chronic kidney disease, stage 4 (severe): Secondary | ICD-10-CM | POA: Diagnosis not present

## 2024-01-04 ENCOUNTER — Other Ambulatory Visit: Payer: Self-pay | Admitting: Internal Medicine

## 2024-01-08 DIAGNOSIS — E1142 Type 2 diabetes mellitus with diabetic polyneuropathy: Secondary | ICD-10-CM | POA: Diagnosis not present

## 2024-01-08 DIAGNOSIS — E1159 Type 2 diabetes mellitus with other circulatory complications: Secondary | ICD-10-CM | POA: Diagnosis not present

## 2024-01-08 DIAGNOSIS — N184 Chronic kidney disease, stage 4 (severe): Secondary | ICD-10-CM | POA: Diagnosis not present

## 2024-01-08 DIAGNOSIS — E1122 Type 2 diabetes mellitus with diabetic chronic kidney disease: Secondary | ICD-10-CM | POA: Diagnosis not present

## 2024-01-08 DIAGNOSIS — I130 Hypertensive heart and chronic kidney disease with heart failure and stage 1 through stage 4 chronic kidney disease, or unspecified chronic kidney disease: Secondary | ICD-10-CM | POA: Diagnosis not present

## 2024-01-08 DIAGNOSIS — I5030 Unspecified diastolic (congestive) heart failure: Secondary | ICD-10-CM | POA: Diagnosis not present

## 2024-01-09 DIAGNOSIS — M25522 Pain in left elbow: Secondary | ICD-10-CM | POA: Diagnosis not present

## 2024-01-15 DIAGNOSIS — I130 Hypertensive heart and chronic kidney disease with heart failure and stage 1 through stage 4 chronic kidney disease, or unspecified chronic kidney disease: Secondary | ICD-10-CM | POA: Diagnosis not present

## 2024-01-15 DIAGNOSIS — I5030 Unspecified diastolic (congestive) heart failure: Secondary | ICD-10-CM | POA: Diagnosis not present

## 2024-01-15 DIAGNOSIS — E1142 Type 2 diabetes mellitus with diabetic polyneuropathy: Secondary | ICD-10-CM | POA: Diagnosis not present

## 2024-01-15 DIAGNOSIS — N184 Chronic kidney disease, stage 4 (severe): Secondary | ICD-10-CM | POA: Diagnosis not present

## 2024-01-15 DIAGNOSIS — E1122 Type 2 diabetes mellitus with diabetic chronic kidney disease: Secondary | ICD-10-CM | POA: Diagnosis not present

## 2024-01-15 DIAGNOSIS — E1159 Type 2 diabetes mellitus with other circulatory complications: Secondary | ICD-10-CM | POA: Diagnosis not present

## 2024-01-21 DIAGNOSIS — L239 Allergic contact dermatitis, unspecified cause: Secondary | ICD-10-CM | POA: Diagnosis not present

## 2024-01-21 DIAGNOSIS — D485 Neoplasm of uncertain behavior of skin: Secondary | ICD-10-CM | POA: Diagnosis not present

## 2024-01-21 DIAGNOSIS — C44722 Squamous cell carcinoma of skin of right lower limb, including hip: Secondary | ICD-10-CM | POA: Diagnosis not present

## 2024-01-21 DIAGNOSIS — D0471 Carcinoma in situ of skin of right lower limb, including hip: Secondary | ICD-10-CM | POA: Diagnosis not present

## 2024-02-05 DIAGNOSIS — R300 Dysuria: Secondary | ICD-10-CM | POA: Diagnosis not present

## 2024-02-05 DIAGNOSIS — R0789 Other chest pain: Secondary | ICD-10-CM | POA: Diagnosis not present

## 2024-02-05 DIAGNOSIS — K5900 Constipation, unspecified: Secondary | ICD-10-CM | POA: Diagnosis not present

## 2024-02-17 ENCOUNTER — Telehealth (HOSPITAL_COMMUNITY): Payer: Self-pay

## 2024-02-17 NOTE — Telephone Encounter (Signed)
 Called to confirm/remind patient of their appointment at the [facility] on 02/18/24.   Appointment:   [] Confirmed  [x] Left mess   [] No answer/No voice mail  [] VM Full/unable to leave message  [] Phone not in service  And to bring in all medications and/or complete list.

## 2024-02-18 ENCOUNTER — Ambulatory Visit (HOSPITAL_COMMUNITY)
Admission: RE | Admit: 2024-02-18 | Discharge: 2024-02-18 | Disposition: A | Discharge status: Home / Self Care | Source: Ambulatory Visit | Attending: Adult Health | Admitting: Adult Health

## 2024-02-18 VITALS — BP 136/60 | HR 74 | Wt 201.2 lb

## 2024-02-18 DIAGNOSIS — Z794 Long term (current) use of insulin: Secondary | ICD-10-CM | POA: Insufficient documentation

## 2024-02-18 DIAGNOSIS — I130 Hypertensive heart and chronic kidney disease with heart failure and stage 1 through stage 4 chronic kidney disease, or unspecified chronic kidney disease: Secondary | ICD-10-CM | POA: Insufficient documentation

## 2024-02-18 DIAGNOSIS — I5033 Acute on chronic diastolic (congestive) heart failure: Secondary | ICD-10-CM

## 2024-02-18 DIAGNOSIS — N184 Chronic kidney disease, stage 4 (severe): Secondary | ICD-10-CM | POA: Insufficient documentation

## 2024-02-18 DIAGNOSIS — E669 Obesity, unspecified: Secondary | ICD-10-CM | POA: Diagnosis not present

## 2024-02-18 DIAGNOSIS — E1122 Type 2 diabetes mellitus with diabetic chronic kidney disease: Secondary | ICD-10-CM | POA: Insufficient documentation

## 2024-02-18 DIAGNOSIS — Z85038 Personal history of other malignant neoplasm of large intestine: Secondary | ICD-10-CM | POA: Insufficient documentation

## 2024-02-18 DIAGNOSIS — I5032 Chronic diastolic (congestive) heart failure: Secondary | ICD-10-CM | POA: Insufficient documentation

## 2024-02-18 DIAGNOSIS — Z6839 Body mass index (BMI) 39.0-39.9, adult: Secondary | ICD-10-CM | POA: Diagnosis not present

## 2024-02-18 DIAGNOSIS — G4733 Obstructive sleep apnea (adult) (pediatric): Secondary | ICD-10-CM | POA: Insufficient documentation

## 2024-02-18 DIAGNOSIS — K219 Gastro-esophageal reflux disease without esophagitis: Secondary | ICD-10-CM | POA: Insufficient documentation

## 2024-02-18 DIAGNOSIS — M8588 Other specified disorders of bone density and structure, other site: Secondary | ICD-10-CM | POA: Diagnosis not present

## 2024-02-18 DIAGNOSIS — I350 Nonrheumatic aortic (valve) stenosis: Secondary | ICD-10-CM | POA: Insufficient documentation

## 2024-02-18 DIAGNOSIS — E119 Type 2 diabetes mellitus without complications: Secondary | ICD-10-CM | POA: Diagnosis not present

## 2024-02-18 DIAGNOSIS — Z950 Presence of cardiac pacemaker: Secondary | ICD-10-CM | POA: Insufficient documentation

## 2024-02-18 MED ORDER — TORSEMIDE 20 MG PO TABS: 40.0000 mg | ORAL_TABLET | Freq: Every day | ORAL | 0 refills | Status: AC

## 2024-02-18 NOTE — Patient Instructions (Addendum)
 Good to see you today!  DECREASE Torsemide  to 40 mg daily   Your physician recommends that you schedule a follow-up appointment in: 6 months(December) Call office in October to schedule an appointment  If you have any questions or concerns before your next appointment please send us  a message through [place] or call our office at [phone].    TO LEAVE A MESSAGE FOR THE NURSE SELECT OPTION 2, PLEASE LEAVE A MESSAGE INCLUDING: YOUR NAME DATE OF BIRTH CALL BACK NUMBER REASON FOR CALL**this is important as we prioritize the call backs  YOU WILL RECEIVE A CALL BACK THE SAME DAY AS LONG AS YOU CALL BEFORE 4:00 PM At the [facility], you and your health needs are our priority. As part of our continuing mission to provide you with exceptional heart care, we have created designated Provider Care Teams. These Care Teams include your primary Cardiologist (physician) and Advanced Practice Providers (APPs- Physician Assistants and Nurse Practitioners) who all work together to provide you with the care you need, when you need it.   You may see any of the following providers on your designated Care Team at your next follow up: Dr Jules Oar Dr Peder Bourdon Dr. Alwin Baars Dr. Arta Lark Amy Marijane Shoulders, NP Ruddy Corral, [place] [facility] [place], [place] Dennise Fitz, NP [place] Lee, NP Shawnee Dellen, NP Luster Salters, PharmD Bevely Brush, PharmD   Please be sure to bring in all your medications bottles to every appointment.    Thank you for choosing [facility]

## 2024-02-18 NOTE — Progress Notes (Signed)
 [facility] Clinic Note   Date:  02/18/2024   ID:  [name], DOB 1940-12-25, MRN [id number]  Location: Home  Provider location: [facility] Type of Visit: Established patient  PCP:  [name], MD  Cardiologist:  [name], MD Nephrology: Dr. [name] HF Cardiologist: Dr. [name]  Chief Complaint: Heart Failure   HPI: [name] is a 83 y.o. female with history of diastolic heart failure, DM, GERD, colon cancer, CKD stage III, 2016 pacemaker (MDT) due to symptomatic sinus node dysfunction.  In 7/19, she had 12 pound weight and she was sent to the ED by her PCP. She was sent home on metolazone  every other day for 14 days. She took 4 doses and her creatinine went up to 3 so metolazone  was stopped.   R/L cath in 9/19. Normal cors. Elevated filling pressures with pulmonary venous HTN.   Had fall with R tibia fracture in 9/21    Having more dyspnea at follow up 2/22, arranged for RHC and repeat echo.  RHC (2/22) showed mild to moderate elevated filling pressures with prominent v waves in PCWP tracing, mild pulmonary HTN and high CO with no evidence of intracardiac shunting. Echo (4/22) showed EF 60-65%, RV ok, mild AS.  SCr remained elevated on lab check at EP visit 8/22 and irbesartan  stopped.  Echo 01/17/22 EF 60-65% G1DD RV normal. RVSP 31  Follow up 9/23, chronically  NYHA IIIb, volume OK on torsemide  40 + metolazone  once a week.  Follow up 11/24, markedly volume overloaded. Instructed to use Furoscix  x 5 days and metolazone . She was able to use 2 doses of Furoscix  but stopped due to sciatic pain from getting up frequently to urinate.  Today she returns for HF follow up.Overall feeling a little stronger. Able to walk to the clinic with a walker. Occasionally short of breath. Denies PND/Orthopnea. Chronically sleeps in a recliner due to back pain. Appetite ok. No fever or chills. Taking all medications. Able to use  compression wraps.    Cardiac Studies: - Echo 9/24 EF 60-65% RV ok. Diastolic function indeterminate   - Echo (5/23): EF 60-65%, grade I DD, RV normal, RVSP 31  - Echo (4/22): EF 60-65%, RV ok, mild AS mean gradient 21.0 mmHg  - RHC (2/22):  RA = 9 RV = 49/13 PA = 49/17 (33) PCW = 22 ( v waves 28) Fick cardiac output/index = 8.0/3.9 PVR = 1.6 WU FA sat = 95% PA sat = 74%, 73% High SVC sat = 69%  Assessment: 1. Mild to moderately elevated filling pressures with prominent v waves in PCWP tracing suggestive of diastolic dysfunction versus mitral regurgitation 2. Mild pulmonary venous HTN 3. High cardiac output with no evidence of intracardiac shunting  - PFTs 05/23/18: FEV1: 1.72 FEV1/FVC ratio: 77% DLCO: 70%  - R/LHC 05/19/18: Ao = 192/74 (118) LV = 201/26 RA = 14 RV = 53/18 PA = 53/19 (38) PCW = 27 (v = 41) Fick cardiac output/index = 5.6/2.8 PVR = 2.0 WU Ao sat = 95% PA sat = 64%, 66%  1. Normal coronaries with ectactic vessels suggestive of longstanding HTN 2. Severe HTN 3. Normal LV function 4. Signficantly elevated filling pressures with pulmonary venous HTN in setting of holding diuretics for 2 days  Plan/Discussion: Filling pressures and BP elevated. Will resume diuretics. Will need aggressive titration of ant-HTN regimen.  - Echo 05/14/18: EF 50-55%, mild LVH - Aortic valve: Sclerosis without stenosis. - Mitral  valve: Moderately calcified annulus. Moderately thickened,   mildly calcified leaflets . - Left atrium: The atrium was mildly dilated. - Atrial septum: No defect or patent foramen ovale was identified.   -Sleep study (10/19): AHI 49  - Myoview (06/21/15): Nuclear stress EF: 64%. There was no ST segment deviation noted during stress. The study is normal. This is a low risk study. No ischemia identified.  SH: Former 30-40 pack year smoker, quit 25 years ago.   Past Medical History:  Diagnosis Date   Anxiety    Arthritis    Cancer (HCC)     colon   CHF (congestive heart failure) (HCC)    Chronic kidney disease    stage 3   Chronic pain syndrome    Diabetes (HCC)    Diabetic neuropathy (HCC)    Dyslipidemia    Early cataracts, bilateral    Fibromyalgia    GERD (gastroesophageal reflux disease)    H/O syncope    Heart murmur    Hypertension    Lumbar spinal stenosis    and scoliosis   Pneumonia    PONV (postoperative nausea and vomiting)    Presence of permanent cardiac pacemaker    Restless legs    Spinal headache    Thyroid  nodule    Past Surgical History:  Procedure Laterality Date   APPENDECTOMY     BREAST SURGERY     lumpectomy   COLON SURGERY     DILATION AND CURETTAGE OF UTERUS     EP IMPLANTABLE DEVICE N/A 07/21/2015   Procedure: Pacemaker Implant;  Surgeon: Verona Goodwill, MD;  Location: [facility] INVASIVE CV LAB;  Service: Cardiovascular;  Laterality: N/A;   LUMBAR LAMINECTOMY/DECOMPRESSION MICRODISCECTOMY N/A 02/02/2016   Procedure: DECOMPRESSION L4-L5 WITH INSITE 2 FUSION ;  Surgeon: Mort Ards, MD;  Location: [facility] OR;  Service: Orthopedics;  Laterality: N/A;   PARTIAL NEPHRECTOMY     RIGHT HEART CATH N/A 11/02/2020   Procedure: RIGHT HEART CATH;  Surgeon: Mardell Shade, MD;  Location: [facility] INVASIVE CV LAB;  Service: Cardiovascular;  Laterality: N/A;   RIGHT/LEFT HEART CATH AND CORONARY ANGIOGRAPHY N/A 05/19/2018   Procedure: RIGHT/LEFT HEART CATH AND CORONARY ANGIOGRAPHY;  Surgeon: Mardell Shade, MD;  Location: [facility] INVASIVE CV LAB;  Service: Cardiovascular;  Laterality: N/A;   TIBIA IM NAIL INSERTION Right 05/19/2020   Procedure: INTRAMEDULLARY (IM) NAIL TIBIAL;  Surgeon: Janeth Medicus, MD;  Location: [facility] OR;  Service: Orthopedics;  Laterality: Right;   TONSILLECTOMY     TUBAL LIGATION     Current Outpatient Medications  Medication Sig Dispense Refill   acetaminophen  (TYLENOL ) 650 MG CR tablet Patient takes 1 tablet by mouth 3 times a day.     B-D UF III MINI PEN NEEDLES 31G X 5 MM MISC       Blood Glucose Monitoring Suppl (ACCU-CHEK AVIVA PLUS) w/Device KIT      Calcium  Carb-Cholecalciferol  (CALCIUM  500 + D3) 500-15 MG-MCG TABS Take 2 tablets by mouth daily.     Continuous Blood Gluc Sensor (FREESTYLE LIBRE 2 SENSOR) MISC See admin instructions.     diclofenac Sodium (VOLTAREN) 1 % GEL Apply 1 application topically at bedtime as needed (sleep).     diphenhydrAMINE  (BENADRYL ) 25 MG tablet Take 25 mg by mouth daily as needed for itching or allergies.     DULoxetine  (CYMBALTA ) 60 MG capsule Take 60 mg by mouth daily.     Furosemide  (FUROSCIX ) 80 MG/10ML CTKT Inject 80 mg into the skin as  directed.     gabapentin  (NEURONTIN ) 300 MG capsule Take 300 mg by mouth at bedtime.     hydrALAZINE  (APRESOLINE ) 100 MG tablet Take 1 tablet (100 mg total) by mouth 3 (three) times daily. 90 tablet 6   insulin  degludec (TRESIBA FLEXTOUCH) 100 UNIT/ML FlexTouch Pen 45 Units as directed.     labetalol  (NORMODYNE ) 200 MG tablet TAKE 1 TABLET BY MOUTH 2 TIMES A DAY. **ABSOLUTE LAST REFILL WITHOUT OFFICE VISIT; PLEASE CALL [phone] FOR APPOINTMENT** 180 tablet 2   linaCLOtide (LINZESS PO) Take 72 mcg by mouth daily.     oxyCODONE -acetaminophen  (PERCOCET) 10-325 MG tablet Take 1 tablet by mouth every 6 (six) hours as needed for pain.     potassium chloride  SA (KLOR-CON  M) 10 MEQ tablet Take 2 tablets (20 mEq total) by mouth daily. Take 4 extra tab on [facility] with metolazone  60 tablet 11   pramipexole  (MIRAPEX ) 0.125 MG tablet Take 0.25 mg by mouth 2 (two) times daily.     rosuvastatin  (CRESTOR ) 10 MG tablet Take 10 mg by mouth daily.      torsemide  (DEMADEX ) 20 MG tablet Take 3 tablets (60 mg total) by mouth daily. (Patient taking differently: Take 20 mg by mouth daily. Patient takes 2 tablets by mouth once a day.) 90 tablet 11   No current facility-administered medications for this encounter.   Allergies:   Penicillins, Codeine, Adhesive [tape], Aspirin , Crab (diagnostic), Iodine, Ivp dye  [iodinated contrast media], and Sulfa antibiotics   Social History:  The patient  reports that she quit smoking about 40 years ago. Her smoking use included cigarettes. She started smoking about 70 years ago. She has never used smokeless tobacco. She reports current alcohol use. She reports that she does not use drugs.   Family History:  The patient's family history includes CAD in an other family member; Sick sinus syndrome in her brother.   ROS:  Please see the history of present illness.   All other systems are personally reviewed and negative.   Recent Labs: 03/27/2023: Hemoglobin 12.8; Platelets 148 08/06/2023: ALT 20; B Natriuretic Peptide 99.2; BUN 52; Creatinine, Ser 2.02; Potassium 3.9; Sodium 137  Personally reviewed   Wt Readings from Last 3 Encounters:  02/18/24 91.3 kg (201 lb 3.2 oz)  10/11/23 90.6 kg (199 lb 12.8 oz)  08/06/23 95.3 kg (210 lb 3.2 oz)    BP 136/60   Pulse 74   Wt 91.3 kg (201 lb 3.2 oz)   SpO2 92%   BMI 39.29 kg/m   Physical Exam:   General:   No resp difficulty Neck: supple. no JVD.  Cor: PMI nondisplaced. Regular rate & rhythm. No rubs, gallops or murmurs. Lungs: clear Abdomen: soft, nontender, nondistended.  Extremities: no cyanosis, clubbing, rash, edema. Compression stocking on bilaterally.  Neuro: alert & oriented x3  PPM interrogation (personally reviewed): No AF. Activity < 0.2 hours per day.    Assessment & Plan: 1. Chronic Diastolic Heart Failure  - Echo 05/14/18: EF 50-55%, posterior basal and inferolateral HK, mild LVH, LA mildly dilated - R/LHC 05/19/18: normal coronaries, elevated filling pressure (had held diuretics x2 days), severe HTN. - RHC (2/22): Mild to moderate elevated filling pressures, mild pulm venous HTN, high CO with no shunting. - Echo (4/22): EF 60-65%, RV ok, mild AS mean gradient 21.0 mmHg - Echo (5/23): EF 60-65% G1DD RV normal. RVSP 31 - Echo 9/24 EF 60-65% RV ok. Diastolic function indeterminate  - NYHA II.  Appears euvolemic. Cut back torsemide   40 mg daily.  - Has metolazone  2.5 mg/40 KCL PRN weight gain/swelling. - Off Jardiance  due to vaginal irriation. - No ARB/ARNi/spiro with CKD. -We are requesting labs from [facility].   2. HTN Stable. Continue hydralazine  100 mg tid. - Continue labetalol  200 mg bid.  3. Aortic Stenosis - Mild by echo 4/22, mean gradient 21 - Mild by echo 01/17/22, mean gradient 13  - AS mild on echo 9/24   4. OSA  - Severe, AHI 49 by PSG in 10/19 - f/u testing AHI 11 - Had televisit with Dr. Micael Adas on 09/30/19. She refused CPAP due to claustrophobia.  - Repeat HST showed severe, AHI 31.1.  - She refuses CPAP   5.  CKD Stage IV - Followed by [facility] (Dr. [name]) - Baseline SCr 2.0-2.4, most recent SCr 2.02 - No longer on SGLT2i - Request BMET from [facility].    6.  Obesity - Needs weight loss.  - Body mass index is 39.29 kg/m.  - Failed Ozempic due to constipation, she declined tirzepatide -Discussed portion control   Follow up with Dr Bensimhon in 6 months     Nieves Bars, NP  02/18/2024 3:55 PM  [facility] [address] [facility] [place] [address] [phone] (office) [phone] (fax)

## 2024-02-19 ENCOUNTER — Ambulatory Visit

## 2024-02-19 DIAGNOSIS — I495 Sick sinus syndrome: Secondary | ICD-10-CM | POA: Diagnosis not present

## 2024-02-19 LAB — CUP PACEART REMOTE DEVICE CHECK
Battery Remaining Longevity: 26 mo
Battery Voltage: 2.93 V
Brady Statistic AP VP Percent: 0.38 %
Brady Statistic AP VS Percent: 97.99 %
Brady Statistic AS VP Percent: 0 %
Brady Statistic AS VS Percent: 1.62 %
Brady Statistic RA Percent Paced: 97.86 %
Brady Statistic RV Percent Paced: 0.41 %
Date Time Interrogation Session: 20250610155449
Implantable Lead Connection Status: 753985
Implantable Lead Connection Status: 753985
Implantable Lead Implant Date: 20161110
Implantable Lead Implant Date: 20161110
Implantable Lead Location: 753859
Implantable Lead Location: 753860
Implantable Lead Model: 5076
Implantable Lead Model: 5076
Implantable Pulse Generator Implant Date: 20161110
Lead Channel Impedance Value: 342 Ohm
Lead Channel Impedance Value: 380 Ohm
Lead Channel Impedance Value: 418 Ohm
Lead Channel Impedance Value: 456 Ohm
Lead Channel Pacing Threshold Amplitude: 0.625 V
Lead Channel Pacing Threshold Amplitude: 2.375 V
Lead Channel Pacing Threshold Pulse Width: 0.4 ms
Lead Channel Pacing Threshold Pulse Width: 0.4 ms
Lead Channel Sensing Intrinsic Amplitude: 2.875 mV
Lead Channel Sensing Intrinsic Amplitude: 2.875 mV
Lead Channel Sensing Intrinsic Amplitude: 3.625 mV
Lead Channel Sensing Intrinsic Amplitude: 3.625 mV
Lead Channel Setting Pacing Amplitude: 1.5 V
Lead Channel Setting Pacing Amplitude: 5 V
Lead Channel Setting Pacing Pulse Width: 1 ms
Lead Channel Setting Sensing Sensitivity: 2.8 mV
Zone Setting Status: 755011
Zone Setting Status: 755011

## 2024-02-21 ENCOUNTER — Other Ambulatory Visit (HOSPITAL_COMMUNITY): Payer: Self-pay | Admitting: Internal Medicine

## 2024-02-23 ENCOUNTER — Ambulatory Visit: Payer: Self-pay | Admitting: Cardiology

## 2024-02-25 DIAGNOSIS — D0471 Carcinoma in situ of skin of right lower limb, including hip: Secondary | ICD-10-CM | POA: Diagnosis not present

## 2024-03-17 DIAGNOSIS — E118 Type 2 diabetes mellitus with unspecified complications: Secondary | ICD-10-CM | POA: Diagnosis not present

## 2024-03-17 DIAGNOSIS — I10 Essential (primary) hypertension: Secondary | ICD-10-CM | POA: Diagnosis not present

## 2024-03-17 DIAGNOSIS — E559 Vitamin D deficiency, unspecified: Secondary | ICD-10-CM | POA: Diagnosis not present

## 2024-03-17 DIAGNOSIS — E7800 Pure hypercholesterolemia, unspecified: Secondary | ICD-10-CM | POA: Diagnosis not present

## 2024-03-20 DIAGNOSIS — E559 Vitamin D deficiency, unspecified: Secondary | ICD-10-CM | POA: Diagnosis not present

## 2024-03-20 DIAGNOSIS — Z0000 Encounter for general adult medical examination without abnormal findings: Secondary | ICD-10-CM | POA: Diagnosis not present

## 2024-03-20 DIAGNOSIS — I10 Essential (primary) hypertension: Secondary | ICD-10-CM | POA: Diagnosis not present

## 2024-03-20 DIAGNOSIS — G8929 Other chronic pain: Secondary | ICD-10-CM | POA: Diagnosis not present

## 2024-03-20 DIAGNOSIS — M810 Age-related osteoporosis without current pathological fracture: Secondary | ICD-10-CM | POA: Diagnosis not present

## 2024-03-20 DIAGNOSIS — Z9181 History of falling: Secondary | ICD-10-CM | POA: Diagnosis not present

## 2024-03-20 DIAGNOSIS — E7800 Pure hypercholesterolemia, unspecified: Secondary | ICD-10-CM | POA: Diagnosis not present

## 2024-03-20 DIAGNOSIS — E118 Type 2 diabetes mellitus with unspecified complications: Secondary | ICD-10-CM | POA: Diagnosis not present

## 2024-04-03 ENCOUNTER — Ambulatory Visit: Admitting: Internal Medicine

## 2024-04-16 NOTE — Progress Notes (Signed)
 Remote pacemaker transmission.

## 2024-04-17 DIAGNOSIS — N184 Chronic kidney disease, stage 4 (severe): Secondary | ICD-10-CM | POA: Diagnosis not present

## 2024-04-20 NOTE — Progress Notes (Signed)
 Electrophysiology Office Note:   Date:  04/21/2024  ID:  [name], DOB [birth year], MRN [id number]  Primary Cardiologist: [name] Bihari, MD Electrophysiologist: Fonda Kitty, MD      History of Present Illness:   [name] is a 83 y.o. female with h/o diastolic heart failure, DM, GERD, colon cancer, CKD stage III, 2016 pacemaker (MDT) due to symptomatic sinus node dysfunction who is being seen today for pacemaker follow up.  Discussed the use of AI scribe software for clinical note transcription with the patient, who gave verbal consent to proceed.  History of Present Illness [name] is an 83 year old with a pacemaker who presents for an annual device check and follow-up.  She experiences significant shortness of breath with exertion.  This is chronic. She experiences significant leg discoloration and swelling, attributed to poor circulation and fluid retention. She uses compression socks and takes a diuretic daily but finds it challenging to manage fluid intake and leg elevation. She has a lift chair to assist with leg elevation but finds it uncomfortable to keep her legs above heart level. She has a non-healing wound on her leg from a skin cancer removal a month ago, which has not healed well, possibly due to her diabetes and circulation issues. She plans to follow up with her dermatologist for further evaluation. Her pacemaker, implanted in November 2016, is nearing the end of its battery life, with an estimated one year and four months remaining. No new or acute complaints.   Review of systems complete and found to be negative unless listed in HPI.   EP Information / Studies Reviewed:    EKG is not ordered today. EKG from 07/31/24 reviewed which showed AP - VS rhythm.      Echo 05/2023:   1. Left ventricular ejection fraction, by estimation, is 60 to 65%. The  left ventricle has normal function. The left ventricle has no regional  wall motion abnormalities. Left  ventricular diastolic parameters are  indeterminate.   2. Right ventricular systolic function is normal. The right ventricular  size is normal.   3. Left atrial size was mildly dilated.   4. Right atrial size was mildly dilated.   5. The mitral valve is degenerative. Mild mitral valve regurgitation. No  evidence of mitral stenosis. Severe mitral annular calcification.   6. The aortic valve is tricuspid. There is moderate calcification of the  aortic valve. There is moderate thickening of the aortic valve. Aortic  valve regurgitation is not visualized. Moderate aortic valve stenosis.   7. The inferior vena cava is normal in size with greater than 50%  respiratory variability, suggesting right atrial pressure of 3 mmHg.   Physical Exam:   VS:  BP (!) 156/72   Pulse 83   Ht 5' (1.524 m)   Wt 195 lb (88.5 kg)   SpO2 93%   BMI 38.08 kg/m    Wt Readings from Last 3 Encounters:  04/21/24 195 lb (88.5 kg)  02/18/24 201 lb 3.2 oz (91.3 kg)  10/11/23 199 lb 12.8 oz (90.6 kg)     GEN: Well nourished, well developed in no acute distress NECK: No JVD CARDIAC: Normal rate, regular rhythm RESPIRATORY:  Clear to auscultation without rales, wheezing or rhonchi  ABDOMEN: Soft, non-distended EXTREMITIES:  2+ edema, stasis dermatitis  ASSESSMENT AND PLAN:    #Sinus node dysfunction s/p pacemaker: - In-clinic device interrogation was performed today.  See Paceart report for full details.  Appropriate device function and stable lead parameters.  V pacing percentage has increased significantly.  We extended AV delays in efforts to decrease ventricular pacing.  Chronically elevated RV threshold.  Changed pulse width to 0.8 ms in efforts to conserve battery. - New remote monitoring.  #Chronic Diastolic Heart Failure: Chronic LE edema.  #Moderate AS: - Diuretics per HF Clinic. Also follows with [facility] for CKD stage IV. - Off Jardiance  due to vaginal irriation. - No ARB/ARNi/spiro with  CKD. - Continue close follow up with HF Clinic.    #Hypertension -Above goal today.  Recommend checking blood pressures 1-2 times per week at home and recording the values.  Recommend bringing these recordings to the primary care physician.    Follow up with EP APP in 6 months.  Signed, Fonda Kitty, MD

## 2024-04-21 ENCOUNTER — Encounter: Payer: Self-pay | Admitting: Cardiology

## 2024-04-21 ENCOUNTER — Ambulatory Visit: Attending: Cardiology | Admitting: Cardiology

## 2024-04-21 VITALS — BP 156/72 | HR 83 | Ht 60.0 in | Wt 195.0 lb

## 2024-04-21 DIAGNOSIS — I10 Essential (primary) hypertension: Secondary | ICD-10-CM

## 2024-04-21 DIAGNOSIS — Z950 Presence of cardiac pacemaker: Secondary | ICD-10-CM

## 2024-04-21 DIAGNOSIS — I495 Sick sinus syndrome: Secondary | ICD-10-CM | POA: Diagnosis not present

## 2024-04-21 DIAGNOSIS — I350 Nonrheumatic aortic (valve) stenosis: Secondary | ICD-10-CM

## 2024-04-21 DIAGNOSIS — M25511 Pain in right shoulder: Secondary | ICD-10-CM | POA: Diagnosis not present

## 2024-04-21 DIAGNOSIS — I5032 Chronic diastolic (congestive) heart failure: Secondary | ICD-10-CM | POA: Diagnosis not present

## 2024-04-21 LAB — CUP PACEART INCLINIC DEVICE CHECK
Date Time Interrogation Session: 20250812165146
Implantable Lead Connection Status: 753985
Implantable Lead Connection Status: 753985
Implantable Lead Implant Date: 20161110
Implantable Lead Implant Date: 20161110
Implantable Lead Location: 753859
Implantable Lead Location: 753860
Implantable Lead Model: 5076
Implantable Lead Model: 5076
Implantable Pulse Generator Implant Date: 20161110

## 2024-04-21 NOTE — Patient Instructions (Signed)

## 2024-04-23 ENCOUNTER — Ambulatory Visit: Payer: Self-pay | Admitting: Cardiology

## 2024-04-23 DIAGNOSIS — E1122 Type 2 diabetes mellitus with diabetic chronic kidney disease: Secondary | ICD-10-CM | POA: Diagnosis not present

## 2024-04-23 DIAGNOSIS — N184 Chronic kidney disease, stage 4 (severe): Secondary | ICD-10-CM | POA: Diagnosis not present

## 2024-04-23 DIAGNOSIS — I5030 Unspecified diastolic (congestive) heart failure: Secondary | ICD-10-CM | POA: Diagnosis not present

## 2024-04-23 DIAGNOSIS — N2581 Secondary hyperparathyroidism of renal origin: Secondary | ICD-10-CM | POA: Diagnosis not present

## 2024-04-23 DIAGNOSIS — I130 Hypertensive heart and chronic kidney disease with heart failure and stage 1 through stage 4 chronic kidney disease, or unspecified chronic kidney disease: Secondary | ICD-10-CM | POA: Diagnosis not present

## 2024-04-23 DIAGNOSIS — E876 Hypokalemia: Secondary | ICD-10-CM | POA: Diagnosis not present

## 2024-04-24 DIAGNOSIS — R2232 Localized swelling, mass and lump, left upper limb: Secondary | ICD-10-CM | POA: Diagnosis not present

## 2024-04-30 DIAGNOSIS — M25522 Pain in left elbow: Secondary | ICD-10-CM | POA: Diagnosis not present

## 2024-05-01 DIAGNOSIS — M79672 Pain in left foot: Secondary | ICD-10-CM | POA: Diagnosis not present

## 2024-05-05 DIAGNOSIS — R200 Anesthesia of skin: Secondary | ICD-10-CM | POA: Diagnosis not present

## 2024-05-05 DIAGNOSIS — M549 Dorsalgia, unspecified: Secondary | ICD-10-CM | POA: Diagnosis not present

## 2024-05-05 DIAGNOSIS — M961 Postlaminectomy syndrome, not elsewhere classified: Secondary | ICD-10-CM | POA: Diagnosis not present

## 2024-05-05 DIAGNOSIS — M5416 Radiculopathy, lumbar region: Secondary | ICD-10-CM | POA: Diagnosis not present

## 2024-05-05 DIAGNOSIS — M810 Age-related osteoporosis without current pathological fracture: Secondary | ICD-10-CM | POA: Diagnosis not present

## 2024-05-13 ENCOUNTER — Other Ambulatory Visit (HOSPITAL_COMMUNITY): Payer: Self-pay | Admitting: Family Medicine

## 2024-05-20 ENCOUNTER — Ambulatory Visit

## 2024-05-20 DIAGNOSIS — E118 Type 2 diabetes mellitus with unspecified complications: Secondary | ICD-10-CM | POA: Diagnosis not present

## 2024-05-20 DIAGNOSIS — N1832 Chronic kidney disease, stage 3b: Secondary | ICD-10-CM | POA: Diagnosis not present

## 2024-05-20 DIAGNOSIS — I495 Sick sinus syndrome: Secondary | ICD-10-CM | POA: Diagnosis not present

## 2024-05-20 DIAGNOSIS — R221 Localized swelling, mass and lump, neck: Secondary | ICD-10-CM | POA: Diagnosis not present

## 2024-05-20 DIAGNOSIS — I10 Essential (primary) hypertension: Secondary | ICD-10-CM | POA: Diagnosis not present

## 2024-05-20 DIAGNOSIS — I509 Heart failure, unspecified: Secondary | ICD-10-CM | POA: Diagnosis not present

## 2024-05-20 DIAGNOSIS — E7800 Pure hypercholesterolemia, unspecified: Secondary | ICD-10-CM | POA: Diagnosis not present

## 2024-05-21 ENCOUNTER — Other Ambulatory Visit: Payer: Self-pay | Admitting: Family Medicine

## 2024-05-21 DIAGNOSIS — R221 Localized swelling, mass and lump, neck: Secondary | ICD-10-CM

## 2024-05-21 LAB — CUP PACEART REMOTE DEVICE CHECK
Battery Remaining Longevity: 14 mo
Battery Voltage: 2.9 V
Brady Statistic AP VP Percent: 82.91 %
Brady Statistic AP VS Percent: 15.5 %
Brady Statistic AS VP Percent: 0.13 %
Brady Statistic AS VS Percent: 1.46 %
Brady Statistic RA Percent Paced: 98.29 %
Brady Statistic RV Percent Paced: 83.16 %
Date Time Interrogation Session: 20250911120002
Implantable Lead Connection Status: 753985
Implantable Lead Connection Status: 753985
Implantable Lead Implant Date: 20161110
Implantable Lead Implant Date: 20161110
Implantable Lead Location: 753859
Implantable Lead Location: 753860
Implantable Lead Model: 5076
Implantable Lead Model: 5076
Implantable Pulse Generator Implant Date: 20161110
Lead Channel Impedance Value: 342 Ohm
Lead Channel Impedance Value: 380 Ohm
Lead Channel Impedance Value: 532 Ohm
Lead Channel Impedance Value: 589 Ohm
Lead Channel Pacing Threshold Amplitude: 0.625 V
Lead Channel Pacing Threshold Amplitude: 2 V
Lead Channel Pacing Threshold Pulse Width: 0.4 ms
Lead Channel Pacing Threshold Pulse Width: 0.4 ms
Lead Channel Sensing Intrinsic Amplitude: 3.75 mV
Lead Channel Sensing Intrinsic Amplitude: 3.75 mV
Lead Channel Sensing Intrinsic Amplitude: 4.75 mV
Lead Channel Sensing Intrinsic Amplitude: 4.75 mV
Lead Channel Setting Pacing Amplitude: 1.5 V
Lead Channel Setting Pacing Amplitude: 4 V
Lead Channel Setting Pacing Pulse Width: 0.4 ms
Lead Channel Setting Sensing Sensitivity: 2.8 mV
Zone Setting Status: 755011
Zone Setting Status: 755011

## 2024-05-22 ENCOUNTER — Ambulatory Visit
Admission: RE | Admit: 2024-05-22 | Discharge: 2024-05-22 | Disposition: A | Discharge status: Home / Self Care | Source: Ambulatory Visit | Attending: Family Medicine | Admitting: Family Medicine

## 2024-05-22 DIAGNOSIS — R221 Localized swelling, mass and lump, neck: Secondary | ICD-10-CM

## 2024-05-22 DIAGNOSIS — E042 Nontoxic multinodular goiter: Secondary | ICD-10-CM | POA: Diagnosis not present

## 2024-05-22 NOTE — Progress Notes (Signed)
 Remote PPM Transmission

## 2024-05-29 LAB — CUP PACEART REMOTE DEVICE CHECK
Battery Remaining Longevity: 14 mo
Battery Voltage: 2.9 V
Brady Statistic AP VP Percent: 82.91 %
Brady Statistic AP VS Percent: 15.5 %
Brady Statistic AS VP Percent: 0.13 %
Brady Statistic AS VS Percent: 1.46 %
Brady Statistic RA Percent Paced: 98.29 %
Brady Statistic RV Percent Paced: 83.16 %
Date Time Interrogation Session: 20250911120002
Implantable Lead Connection Status: 753985
Implantable Lead Connection Status: 753985
Implantable Lead Implant Date: 20161110
Implantable Lead Implant Date: 20161110
Implantable Lead Location: 753859
Implantable Lead Location: 753860
Implantable Lead Model: 5076
Implantable Lead Model: 5076
Implantable Pulse Generator Implant Date: 20161110
Lead Channel Impedance Value: 342 Ohm
Lead Channel Impedance Value: 380 Ohm
Lead Channel Impedance Value: 532 Ohm
Lead Channel Impedance Value: 589 Ohm
Lead Channel Pacing Threshold Amplitude: 0.625 V
Lead Channel Pacing Threshold Amplitude: 2 V
Lead Channel Pacing Threshold Pulse Width: 0.4 ms
Lead Channel Pacing Threshold Pulse Width: 0.4 ms
Lead Channel Sensing Intrinsic Amplitude: 3.75 mV
Lead Channel Sensing Intrinsic Amplitude: 3.75 mV
Lead Channel Sensing Intrinsic Amplitude: 4.75 mV
Lead Channel Sensing Intrinsic Amplitude: 4.75 mV
Lead Channel Setting Pacing Amplitude: 1.5 V
Lead Channel Setting Pacing Amplitude: 4 V
Lead Channel Setting Pacing Pulse Width: 0.4 ms
Lead Channel Setting Sensing Sensitivity: 2.8 mV
Zone Setting Status: 755011
Zone Setting Status: 755011

## 2024-05-31 ENCOUNTER — Ambulatory Visit: Payer: Self-pay | Admitting: Cardiology

## 2024-06-23 DIAGNOSIS — R221 Localized swelling, mass and lump, neck: Secondary | ICD-10-CM | POA: Diagnosis not present

## 2024-06-23 DIAGNOSIS — N1832 Chronic kidney disease, stage 3b: Secondary | ICD-10-CM | POA: Diagnosis not present

## 2024-06-23 DIAGNOSIS — E118 Type 2 diabetes mellitus with unspecified complications: Secondary | ICD-10-CM | POA: Diagnosis not present

## 2024-06-23 DIAGNOSIS — I10 Essential (primary) hypertension: Secondary | ICD-10-CM | POA: Diagnosis not present

## 2024-06-23 DIAGNOSIS — Z23 Encounter for immunization: Secondary | ICD-10-CM | POA: Diagnosis not present

## 2024-06-23 DIAGNOSIS — E7800 Pure hypercholesterolemia, unspecified: Secondary | ICD-10-CM | POA: Diagnosis not present

## 2024-06-23 DIAGNOSIS — I509 Heart failure, unspecified: Secondary | ICD-10-CM | POA: Diagnosis not present

## 2024-06-23 DIAGNOSIS — L309 Dermatitis, unspecified: Secondary | ICD-10-CM | POA: Diagnosis not present

## 2024-08-18 ENCOUNTER — Ambulatory Visit

## 2024-08-19 ENCOUNTER — Encounter

## 2024-08-24 LAB — CUP PACEART REMOTE DEVICE CHECK
Battery Remaining Longevity: 14 mo
Battery Voltage: 2.9 V
Brady Statistic AP VP Percent: 81.35 %
Brady Statistic AP VS Percent: 17.72 %
Brady Statistic AS VP Percent: 0.13 %
Brady Statistic AS VS Percent: 0.8 %
Brady Statistic RA Percent Paced: 99.01 %
Brady Statistic RV Percent Paced: 81.55 %
Date Time Interrogation Session: 20251212144712
Implantable Lead Connection Status: 753985
Implantable Lead Connection Status: 753985
Implantable Lead Implant Date: 20161110
Implantable Lead Implant Date: 20161110
Implantable Lead Location: 753859
Implantable Lead Location: 753860
Implantable Lead Model: 5076
Implantable Lead Model: 5076
Implantable Pulse Generator Implant Date: 20161110
Lead Channel Impedance Value: 342 Ohm
Lead Channel Impedance Value: 380 Ohm
Lead Channel Impedance Value: 646 Ohm
Lead Channel Impedance Value: 703 Ohm
Lead Channel Pacing Threshold Amplitude: 0.625 V
Lead Channel Pacing Threshold Amplitude: 1.625 V
Lead Channel Pacing Threshold Pulse Width: 0.4 ms
Lead Channel Pacing Threshold Pulse Width: 0.4 ms
Lead Channel Sensing Intrinsic Amplitude: 3.25 mV
Lead Channel Sensing Intrinsic Amplitude: 3.25 mV
Lead Channel Sensing Intrinsic Amplitude: 5.25 mV
Lead Channel Sensing Intrinsic Amplitude: 5.25 mV
Lead Channel Setting Pacing Amplitude: 1.5 V
Lead Channel Setting Pacing Amplitude: 3.25 V
Lead Channel Setting Pacing Pulse Width: 0.4 ms
Lead Channel Setting Sensing Sensitivity: 2.8 mV
Zone Setting Status: 755011
Zone Setting Status: 755011

## 2024-08-25 NOTE — Progress Notes (Signed)
 Remote PPM Transmission

## 2024-08-28 ENCOUNTER — Ambulatory Visit: Payer: Self-pay | Admitting: Cardiology

## 2024-11-17 ENCOUNTER — Encounter

## 2024-11-18 ENCOUNTER — Encounter

## 2025-02-16 ENCOUNTER — Encounter

## 2025-05-18 ENCOUNTER — Encounter

## 2025-08-17 ENCOUNTER — Encounter

## 2025-11-16 ENCOUNTER — Encounter

## 2026-02-15 ENCOUNTER — Encounter
